# Patient Record
Sex: Female | Born: 1945 | Race: White | Hispanic: No | Marital: Married | Smoking: Former smoker
Health system: Southern US, Community
[De-identification: ages and names within clinical notes are randomized; demographics above are authoritative.]

## PROBLEM LIST (undated history)

## (undated) DIAGNOSIS — Z86010 Personal history of colonic polyps: Secondary | ICD-10-CM

## (undated) DIAGNOSIS — I499 Cardiac arrhythmia, unspecified: Secondary | ICD-10-CM

## (undated) DIAGNOSIS — F419 Anxiety disorder, unspecified: Secondary | ICD-10-CM

## (undated) DIAGNOSIS — C801 Malignant (primary) neoplasm, unspecified: Secondary | ICD-10-CM

## (undated) DIAGNOSIS — M1990 Unspecified osteoarthritis, unspecified site: Secondary | ICD-10-CM

## (undated) DIAGNOSIS — K2970 Gastritis, unspecified, without bleeding: Secondary | ICD-10-CM

## (undated) DIAGNOSIS — C50919 Malignant neoplasm of unspecified site of unspecified female breast: Secondary | ICD-10-CM

## (undated) DIAGNOSIS — G43909 Migraine, unspecified, not intractable, without status migrainosus: Secondary | ICD-10-CM

## (undated) DIAGNOSIS — G4730 Sleep apnea, unspecified: Secondary | ICD-10-CM

## (undated) DIAGNOSIS — I4891 Unspecified atrial fibrillation: Secondary | ICD-10-CM

## (undated) DIAGNOSIS — Z923 Personal history of irradiation: Secondary | ICD-10-CM

## (undated) DIAGNOSIS — G4733 Obstructive sleep apnea (adult) (pediatric): Secondary | ICD-10-CM

## (undated) DIAGNOSIS — E785 Hyperlipidemia, unspecified: Secondary | ICD-10-CM

## (undated) DIAGNOSIS — Z9221 Personal history of antineoplastic chemotherapy: Secondary | ICD-10-CM

## (undated) DIAGNOSIS — K219 Gastro-esophageal reflux disease without esophagitis: Secondary | ICD-10-CM

## (undated) DIAGNOSIS — M8580 Other specified disorders of bone density and structure, unspecified site: Secondary | ICD-10-CM

## (undated) DIAGNOSIS — F32A Depression, unspecified: Secondary | ICD-10-CM

## (undated) DIAGNOSIS — K222 Esophageal obstruction: Secondary | ICD-10-CM

## (undated) DIAGNOSIS — D649 Anemia, unspecified: Secondary | ICD-10-CM

## (undated) HISTORY — DX: Personal history of antineoplastic chemotherapy: Z92.21

## (undated) HISTORY — DX: Sleep apnea, unspecified: G47.30

## (undated) HISTORY — DX: Gastritis, unspecified, without bleeding: K29.70

## (undated) HISTORY — DX: Obstructive sleep apnea (adult) (pediatric): G47.33

## (undated) HISTORY — DX: Anemia, unspecified: D64.9

## (undated) HISTORY — DX: Other specified disorders of bone density and structure, unspecified site: M85.80

## (undated) HISTORY — DX: Malignant neoplasm of unspecified site of unspecified female breast: C50.919

## (undated) HISTORY — PX: EYE SURGERY: SHX253

## (undated) HISTORY — DX: Personal history of irradiation: Z92.3

## (undated) HISTORY — DX: Migraine, unspecified, not intractable, without status migrainosus: G43.909

## (undated) HISTORY — DX: Gastro-esophageal reflux disease without esophagitis: K21.9

## (undated) HISTORY — DX: Personal history of colonic polyps: Z86.010

## (undated) HISTORY — PX: COLONOSCOPY: SHX174

## (undated) HISTORY — PX: OTHER SURGICAL HISTORY: SHX169

## (undated) HISTORY — PX: UPPER GASTROINTESTINAL ENDOSCOPY: SHX188

## (undated) HISTORY — DX: Hyperlipidemia, unspecified: E78.5

## (undated) HISTORY — DX: Malignant (primary) neoplasm, unspecified: C80.1

## (undated) HISTORY — DX: Esophageal obstruction: K22.2

---

## 1997-07-12 ENCOUNTER — Other Ambulatory Visit: Admission: RE | Admit: 1997-07-12 | Discharge: 1997-07-12 | Payer: Self-pay | Admitting: Obstetrics & Gynecology

## 1998-04-03 ENCOUNTER — Ambulatory Visit (HOSPITAL_COMMUNITY): Admission: RE | Admit: 1998-04-03 | Discharge: 1998-04-03 | Payer: Self-pay | Admitting: Internal Medicine

## 1998-04-03 ENCOUNTER — Encounter: Payer: Self-pay | Admitting: Internal Medicine

## 1998-04-10 ENCOUNTER — Encounter: Payer: Self-pay | Admitting: Internal Medicine

## 1998-04-10 ENCOUNTER — Ambulatory Visit (HOSPITAL_COMMUNITY): Admission: RE | Admit: 1998-04-10 | Discharge: 1998-04-10 | Payer: Self-pay | Admitting: Internal Medicine

## 1998-07-16 ENCOUNTER — Other Ambulatory Visit: Admission: RE | Admit: 1998-07-16 | Discharge: 1998-07-16 | Payer: Self-pay | Admitting: Obstetrics and Gynecology

## 2000-03-09 ENCOUNTER — Other Ambulatory Visit: Admission: RE | Admit: 2000-03-09 | Discharge: 2000-03-09 | Payer: Self-pay | Admitting: Obstetrics and Gynecology

## 2000-04-23 ENCOUNTER — Other Ambulatory Visit: Admission: RE | Admit: 2000-04-23 | Discharge: 2000-04-23 | Payer: Self-pay | Admitting: Gastroenterology

## 2000-04-23 ENCOUNTER — Encounter (INDEPENDENT_AMBULATORY_CARE_PROVIDER_SITE_OTHER): Payer: Self-pay | Admitting: Specialist

## 2003-10-02 ENCOUNTER — Other Ambulatory Visit: Admission: RE | Admit: 2003-10-02 | Discharge: 2003-10-02 | Payer: Self-pay | Admitting: Obstetrics and Gynecology

## 2004-02-06 ENCOUNTER — Ambulatory Visit: Payer: Self-pay | Admitting: Internal Medicine

## 2004-03-11 ENCOUNTER — Ambulatory Visit: Payer: Self-pay | Admitting: Internal Medicine

## 2004-03-28 ENCOUNTER — Ambulatory Visit: Payer: Self-pay | Admitting: Internal Medicine

## 2004-10-15 ENCOUNTER — Other Ambulatory Visit: Admission: RE | Admit: 2004-10-15 | Discharge: 2004-10-15 | Payer: Self-pay | Admitting: Obstetrics and Gynecology

## 2004-11-18 ENCOUNTER — Ambulatory Visit: Payer: Self-pay | Admitting: Internal Medicine

## 2004-11-19 ENCOUNTER — Ambulatory Visit: Payer: Self-pay

## 2005-01-01 ENCOUNTER — Ambulatory Visit: Payer: Self-pay | Admitting: Internal Medicine

## 2005-06-04 ENCOUNTER — Ambulatory Visit: Payer: Self-pay | Admitting: Gastroenterology

## 2005-06-18 ENCOUNTER — Ambulatory Visit: Payer: Self-pay | Admitting: Gastroenterology

## 2005-06-18 ENCOUNTER — Encounter: Payer: Self-pay | Admitting: Internal Medicine

## 2005-11-26 ENCOUNTER — Other Ambulatory Visit: Admission: RE | Admit: 2005-11-26 | Discharge: 2005-11-26 | Payer: Self-pay | Admitting: Obstetrics and Gynecology

## 2006-12-23 ENCOUNTER — Ambulatory Visit: Payer: Self-pay | Admitting: Internal Medicine

## 2006-12-23 ENCOUNTER — Encounter: Payer: Self-pay | Admitting: Internal Medicine

## 2006-12-23 DIAGNOSIS — M949 Disorder of cartilage, unspecified: Secondary | ICD-10-CM

## 2006-12-23 DIAGNOSIS — G252 Other specified forms of tremor: Secondary | ICD-10-CM

## 2006-12-23 DIAGNOSIS — M899 Disorder of bone, unspecified: Secondary | ICD-10-CM | POA: Insufficient documentation

## 2006-12-23 DIAGNOSIS — G43809 Other migraine, not intractable, without status migrainosus: Secondary | ICD-10-CM | POA: Insufficient documentation

## 2006-12-23 DIAGNOSIS — F329 Major depressive disorder, single episode, unspecified: Secondary | ICD-10-CM

## 2006-12-23 DIAGNOSIS — F411 Generalized anxiety disorder: Secondary | ICD-10-CM | POA: Insufficient documentation

## 2006-12-23 DIAGNOSIS — F32A Depression, unspecified: Secondary | ICD-10-CM | POA: Insufficient documentation

## 2006-12-23 DIAGNOSIS — G250 Essential tremor: Secondary | ICD-10-CM

## 2006-12-29 ENCOUNTER — Encounter: Payer: Self-pay | Admitting: *Deleted

## 2006-12-29 DIAGNOSIS — I868 Varicose veins of other specified sites: Secondary | ICD-10-CM

## 2007-01-12 ENCOUNTER — Other Ambulatory Visit: Admission: RE | Admit: 2007-01-12 | Discharge: 2007-01-12 | Payer: Self-pay | Admitting: Obstetrics and Gynecology

## 2007-02-22 ENCOUNTER — Telehealth: Payer: Self-pay | Admitting: Internal Medicine

## 2007-02-23 ENCOUNTER — Telehealth: Payer: Self-pay | Admitting: Internal Medicine

## 2007-05-16 ENCOUNTER — Telehealth: Payer: Self-pay | Admitting: Internal Medicine

## 2007-07-19 ENCOUNTER — Encounter: Payer: Self-pay | Admitting: Internal Medicine

## 2007-08-17 ENCOUNTER — Ambulatory Visit: Payer: Self-pay | Admitting: Internal Medicine

## 2007-08-17 DIAGNOSIS — L255 Unspecified contact dermatitis due to plants, except food: Secondary | ICD-10-CM

## 2007-09-06 ENCOUNTER — Telehealth: Payer: Self-pay | Admitting: Internal Medicine

## 2007-10-01 DIAGNOSIS — K2970 Gastritis, unspecified, without bleeding: Secondary | ICD-10-CM

## 2007-10-01 HISTORY — DX: Gastritis, unspecified, without bleeding: K29.70

## 2007-10-10 ENCOUNTER — Telehealth: Payer: Self-pay | Admitting: Internal Medicine

## 2007-10-10 ENCOUNTER — Ambulatory Visit (HOSPITAL_COMMUNITY): Admission: RE | Admit: 2007-10-10 | Discharge: 2007-10-10 | Payer: Self-pay | Admitting: Internal Medicine

## 2007-10-10 ENCOUNTER — Encounter: Payer: Self-pay | Admitting: Internal Medicine

## 2007-11-22 ENCOUNTER — Encounter: Payer: Self-pay | Admitting: Internal Medicine

## 2007-11-23 ENCOUNTER — Encounter: Payer: Self-pay | Admitting: Internal Medicine

## 2007-11-23 ENCOUNTER — Ambulatory Visit: Payer: Self-pay | Admitting: Internal Medicine

## 2007-11-23 DIAGNOSIS — K222 Esophageal obstruction: Secondary | ICD-10-CM

## 2007-11-23 DIAGNOSIS — C50919 Malignant neoplasm of unspecified site of unspecified female breast: Secondary | ICD-10-CM

## 2007-11-23 DIAGNOSIS — K2970 Gastritis, unspecified, without bleeding: Secondary | ICD-10-CM | POA: Insufficient documentation

## 2007-11-23 DIAGNOSIS — Z853 Personal history of malignant neoplasm of breast: Secondary | ICD-10-CM

## 2007-11-23 DIAGNOSIS — K219 Gastro-esophageal reflux disease without esophagitis: Secondary | ICD-10-CM | POA: Insufficient documentation

## 2007-11-23 DIAGNOSIS — K2990 Gastroduodenitis, unspecified, without bleeding: Secondary | ICD-10-CM

## 2007-11-23 HISTORY — DX: Malignant neoplasm of unspecified site of unspecified female breast: C50.919

## 2007-11-23 HISTORY — DX: Esophageal obstruction: K22.2

## 2007-11-30 ENCOUNTER — Encounter: Payer: Self-pay | Admitting: Internal Medicine

## 2007-12-05 ENCOUNTER — Ambulatory Visit: Payer: Self-pay | Admitting: Oncology

## 2007-12-06 ENCOUNTER — Ambulatory Visit: Payer: Self-pay | Admitting: Internal Medicine

## 2007-12-08 ENCOUNTER — Encounter: Admission: RE | Admit: 2007-12-08 | Discharge: 2007-12-08 | Payer: Self-pay | Admitting: Surgery

## 2007-12-12 ENCOUNTER — Encounter: Payer: Self-pay | Admitting: Internal Medicine

## 2007-12-12 LAB — CBC WITH DIFFERENTIAL/PLATELET
Basophils Absolute: 0 10*3/uL (ref 0.0–0.1)
Eosinophils Absolute: 0 10*3/uL (ref 0.0–0.5)
HGB: 14.1 g/dL (ref 11.6–15.9)
LYMPH%: 18.6 % (ref 14.0–48.0)
MCV: 86.3 fL (ref 81.0–101.0)
MONO#: 0.7 10*3/uL (ref 0.1–0.9)
MONO%: 8.5 % (ref 0.0–13.0)
NEUT#: 6.1 10*3/uL (ref 1.5–6.5)
Platelets: 392 10*3/uL (ref 145–400)
RBC: 4.78 10*6/uL (ref 3.70–5.32)
RDW: 15.8 % — ABNORMAL HIGH (ref 11.3–14.5)
WBC: 8.4 10*3/uL (ref 3.9–10.0)

## 2007-12-13 LAB — COMPREHENSIVE METABOLIC PANEL
Albumin: 4.6 g/dL (ref 3.5–5.2)
BUN: 23 mg/dL (ref 6–23)
CO2: 22 mEq/L (ref 19–32)
Glucose, Bld: 107 mg/dL — ABNORMAL HIGH (ref 70–99)
Potassium: 3.8 mEq/L (ref 3.5–5.3)
Sodium: 135 mEq/L (ref 135–145)
Total Protein: 7.4 g/dL (ref 6.0–8.3)

## 2007-12-13 LAB — VITAMIN D 25 HYDROXY (VIT D DEFICIENCY, FRACTURES): Vit D, 25-Hydroxy: 43 ng/mL (ref 30–89)

## 2007-12-13 LAB — CANCER ANTIGEN 27.29: CA 27.29: 28 U/mL (ref 0–39)

## 2007-12-15 ENCOUNTER — Encounter: Payer: Self-pay | Admitting: Oncology

## 2007-12-15 ENCOUNTER — Ambulatory Visit (HOSPITAL_COMMUNITY): Admission: RE | Admit: 2007-12-15 | Discharge: 2007-12-15 | Payer: Self-pay | Admitting: Oncology

## 2007-12-15 LAB — PROTEIN / CREATININE RATIO, URINE: Creatinine, Urine: 24.4 mg/dL

## 2007-12-16 ENCOUNTER — Ambulatory Visit (HOSPITAL_COMMUNITY): Admission: RE | Admit: 2007-12-16 | Discharge: 2007-12-16 | Payer: Self-pay | Admitting: Oncology

## 2007-12-20 ENCOUNTER — Ambulatory Visit (HOSPITAL_COMMUNITY): Admission: RE | Admit: 2007-12-20 | Discharge: 2007-12-20 | Payer: Self-pay | Admitting: Surgery

## 2008-01-02 ENCOUNTER — Encounter: Payer: Self-pay | Admitting: Internal Medicine

## 2008-01-02 LAB — CBC WITH DIFFERENTIAL/PLATELET
BASO%: 0.2 % (ref 0.0–2.0)
Eosinophils Absolute: 0.1 10*3/uL (ref 0.0–0.5)
LYMPH%: 61.4 % — ABNORMAL HIGH (ref 14.0–48.0)
MCH: 30 pg (ref 26.0–34.0)
MCHC: 34.5 g/dL (ref 32.0–36.0)
MCV: 86.8 fL (ref 81.0–101.0)
MONO%: 1.3 % (ref 0.0–13.0)
Platelets: 179 10*3/uL (ref 145–400)
RBC: 4.61 10*6/uL (ref 3.70–5.32)

## 2008-01-04 LAB — CBC WITH DIFFERENTIAL/PLATELET
BASO%: 1.7 % (ref 0.0–2.0)
LYMPH%: 67.1 % — ABNORMAL HIGH (ref 14.0–48.0)
MCHC: 34.8 g/dL (ref 32.0–36.0)
MONO#: 0.1 10*3/uL (ref 0.1–0.9)
Platelets: 117 10*3/uL — ABNORMAL LOW (ref 145–400)
RBC: 4.2 10*6/uL (ref 3.70–5.32)
WBC: 1.2 10*3/uL — ABNORMAL LOW (ref 3.9–10.0)

## 2008-01-11 ENCOUNTER — Ambulatory Visit: Payer: Self-pay | Admitting: Internal Medicine

## 2008-01-16 ENCOUNTER — Encounter: Payer: Self-pay | Admitting: Internal Medicine

## 2008-01-16 LAB — CBC WITH DIFFERENTIAL/PLATELET
BASO%: 0.2 % (ref 0.0–2.0)
HCT: 36.6 % (ref 34.8–46.6)
MCHC: 34 g/dL (ref 32.0–36.0)
MONO#: 0.4 10*3/uL (ref 0.1–0.9)
NEUT%: 87.5 % — ABNORMAL HIGH (ref 39.6–76.8)
RBC: 4.12 10*6/uL (ref 3.70–5.32)
RDW: 17.8 % — ABNORMAL HIGH (ref 11.3–14.5)
WBC: 19.9 10*3/uL — ABNORMAL HIGH (ref 3.9–10.0)
lymph#: 2 10*3/uL (ref 0.9–3.3)

## 2008-01-18 ENCOUNTER — Ambulatory Visit: Payer: Self-pay | Admitting: Genetic Counselor

## 2008-01-18 LAB — CBC WITH DIFFERENTIAL/PLATELET
BASO%: 0.1 % (ref 0.0–2.0)
EOS%: 0 % (ref 0.0–7.0)
HCT: 37.3 % (ref 34.8–46.6)
MCH: 30 pg (ref 26.0–34.0)
MCHC: 33.8 g/dL (ref 32.0–36.0)
MCV: 88.8 fL (ref 81.0–101.0)
MONO%: 1.7 % (ref 0.0–13.0)
NEUT%: 92.6 % — ABNORMAL HIGH (ref 39.6–76.8)
lymph#: 0.9 10*3/uL (ref 0.9–3.3)

## 2008-01-19 ENCOUNTER — Ambulatory Visit: Payer: Self-pay | Admitting: Oncology

## 2008-01-20 LAB — CBC WITH DIFFERENTIAL/PLATELET
BASO%: 0.2 % (ref 0.0–2.0)
EOS%: 0.1 % (ref 0.0–7.0)
HGB: 12.3 g/dL (ref 11.6–15.9)
MCH: 30.1 pg (ref 26.0–34.0)
MCHC: 34 g/dL (ref 32.0–36.0)
MCV: 88.4 fL (ref 81.0–101.0)
MONO%: 0.1 % (ref 0.0–13.0)
RBC: 4.1 10*6/uL (ref 3.70–5.32)
RDW: 17.7 % — ABNORMAL HIGH (ref 11.3–14.5)
lymph#: 1.4 10*3/uL (ref 0.9–3.3)

## 2008-01-24 ENCOUNTER — Encounter: Payer: Self-pay | Admitting: Internal Medicine

## 2008-01-24 LAB — CBC WITH DIFFERENTIAL/PLATELET
Basophils Absolute: 0.1 10*3/uL (ref 0.0–0.1)
Eosinophils Absolute: 0 10*3/uL (ref 0.0–0.5)
HGB: 12.3 g/dL (ref 11.6–15.9)
MONO%: 7.3 % (ref 0.0–13.0)
NEUT#: 2.6 10*3/uL (ref 1.5–6.5)
RBC: 4.16 10*6/uL (ref 3.70–5.32)
RDW: 15.4 % — ABNORMAL HIGH (ref 11.3–14.5)
WBC: 4.3 10*3/uL (ref 3.9–10.0)
lymph#: 1.2 10*3/uL (ref 0.9–3.3)

## 2008-02-06 ENCOUNTER — Encounter: Payer: Self-pay | Admitting: Internal Medicine

## 2008-02-06 LAB — COMPREHENSIVE METABOLIC PANEL
ALT: 13 U/L (ref 0–35)
AST: 16 U/L (ref 0–37)
Albumin: 3.3 g/dL — ABNORMAL LOW (ref 3.5–5.2)
Alkaline Phosphatase: 91 U/L (ref 39–117)
BUN: 8 mg/dL (ref 6–23)
Calcium: 9 mg/dL (ref 8.4–10.5)
Chloride: 104 mEq/L (ref 96–112)
Potassium: 4 mEq/L (ref 3.5–5.3)
Sodium: 140 mEq/L (ref 135–145)
Total Protein: 6.7 g/dL (ref 6.0–8.3)

## 2008-02-06 LAB — CBC WITH DIFFERENTIAL/PLATELET
BASO%: 1.5 % (ref 0.0–2.0)
Basophils Absolute: 0.1 10*3/uL (ref 0.0–0.1)
EOS%: 4 % (ref 0.0–7.0)
HGB: 11.6 g/dL (ref 11.6–15.9)
MCH: 30.4 pg (ref 26.0–34.0)
RBC: 3.81 10*6/uL (ref 3.70–5.32)
RDW: 18 % — ABNORMAL HIGH (ref 11.3–14.5)
lymph#: 0.9 10*3/uL (ref 0.9–3.3)

## 2008-02-06 LAB — PROTEIN / CREATININE RATIO, URINE: Total Protein, Urine: 9 mg/dL

## 2008-02-14 ENCOUNTER — Encounter: Payer: Self-pay | Admitting: Internal Medicine

## 2008-02-14 LAB — CBC WITH DIFFERENTIAL/PLATELET
Basophils Absolute: 0.2 10*3/uL — ABNORMAL HIGH (ref 0.0–0.1)
Eosinophils Absolute: 0.1 10*3/uL (ref 0.0–0.5)
HCT: 35.8 % (ref 34.8–46.6)
HGB: 12.1 g/dL (ref 11.6–15.9)
MCV: 86.8 fL (ref 81.0–101.0)
NEUT#: 7 10*3/uL — ABNORMAL HIGH (ref 1.5–6.5)
NEUT%: 68.1 % (ref 39.6–76.8)
RDW: 15.7 % — ABNORMAL HIGH (ref 11.3–14.5)
lymph#: 2 10*3/uL (ref 0.9–3.3)

## 2008-02-28 ENCOUNTER — Encounter: Payer: Self-pay | Admitting: Internal Medicine

## 2008-02-28 ENCOUNTER — Ambulatory Visit: Payer: Self-pay | Admitting: Oncology

## 2008-02-28 LAB — CBC WITH DIFFERENTIAL/PLATELET
Basophils Absolute: 0.1 10*3/uL (ref 0.0–0.1)
Eosinophils Absolute: 0.1 10*3/uL (ref 0.0–0.5)
HGB: 11.1 g/dL — ABNORMAL LOW (ref 11.6–15.9)
LYMPH%: 24.1 % (ref 14.0–48.0)
MCV: 88.5 fL (ref 81.0–101.0)
MONO%: 8.6 % (ref 0.0–13.0)
NEUT#: 4.2 10*3/uL (ref 1.5–6.5)
NEUT%: 64.2 % (ref 39.6–76.8)
Platelets: 431 10*3/uL — ABNORMAL HIGH (ref 145–400)

## 2008-03-06 ENCOUNTER — Encounter: Payer: Self-pay | Admitting: Internal Medicine

## 2008-03-06 LAB — CBC WITH DIFFERENTIAL/PLATELET
BASO%: 1 % (ref 0.0–2.0)
EOS%: 1.6 % (ref 0.0–7.0)
MCH: 30.7 pg (ref 26.0–34.0)
MCHC: 33.7 g/dL (ref 32.0–36.0)
MONO%: 8.6 % (ref 0.0–13.0)
NEUT%: 67 % (ref 39.6–76.8)
RDW: 17.9 % — ABNORMAL HIGH (ref 11.3–14.5)
lymph#: 1.7 10*3/uL (ref 0.9–3.3)

## 2008-03-20 ENCOUNTER — Encounter: Payer: Self-pay | Admitting: Internal Medicine

## 2008-03-27 ENCOUNTER — Encounter: Payer: Self-pay | Admitting: Internal Medicine

## 2008-04-06 ENCOUNTER — Ambulatory Visit: Payer: Self-pay | Admitting: Oncology

## 2008-04-10 ENCOUNTER — Encounter: Payer: Self-pay | Admitting: Internal Medicine

## 2008-04-10 LAB — CBC WITH DIFFERENTIAL/PLATELET
BASO%: 0.5 % (ref 0.0–2.0)
EOS%: 0.6 % (ref 0.0–7.0)
HCT: 36.5 % (ref 34.8–46.6)
LYMPH%: 29.3 % (ref 14.0–48.0)
MCH: 31.2 pg (ref 26.0–34.0)
MCHC: 33.3 g/dL (ref 32.0–36.0)
MCV: 93.6 fL (ref 81.0–101.0)
NEUT%: 57.3 % (ref 39.6–76.8)
Platelets: 264 10*3/uL (ref 145–400)

## 2008-04-17 ENCOUNTER — Encounter: Payer: Self-pay | Admitting: Internal Medicine

## 2008-04-17 LAB — CBC WITH DIFFERENTIAL/PLATELET
BASO%: 0.6 % (ref 0.0–2.0)
EOS%: 1.6 % (ref 0.0–7.0)
HGB: 12 g/dL (ref 11.6–15.9)
MCH: 31.6 pg (ref 26.0–34.0)
MCHC: 33.5 g/dL (ref 32.0–36.0)
RDW: 17.9 % — ABNORMAL HIGH (ref 11.3–14.5)
lymph#: 0.3 10*3/uL — ABNORMAL LOW (ref 0.9–3.3)

## 2008-05-01 LAB — CBC WITH DIFFERENTIAL/PLATELET
BASO%: 0.3 % (ref 0.0–2.0)
Basophils Absolute: 0 10*3/uL (ref 0.0–0.1)
EOS%: 2.5 % (ref 0.0–7.0)
HCT: 39.3 % (ref 34.8–46.6)
LYMPH%: 15.6 % (ref 14.0–49.7)
MCH: 31.7 pg (ref 25.1–34.0)
MCHC: 33.5 g/dL (ref 31.5–36.0)
NEUT%: 73 % (ref 38.4–76.8)
Platelets: 350 10*3/uL (ref 145–400)

## 2008-05-01 LAB — COMPREHENSIVE METABOLIC PANEL
ALT: 26 U/L (ref 0–35)
AST: 30 U/L (ref 0–37)
CO2: 28 mEq/L (ref 19–32)
Calcium: 9.9 mg/dL (ref 8.4–10.5)
Chloride: 98 mEq/L (ref 96–112)
Creatinine, Ser: 0.57 mg/dL (ref 0.40–1.20)
Sodium: 134 mEq/L — ABNORMAL LOW (ref 135–145)
Total Bilirubin: 0.5 mg/dL (ref 0.3–1.2)
Total Protein: 6.9 g/dL (ref 6.0–8.3)

## 2008-05-08 ENCOUNTER — Encounter: Payer: Self-pay | Admitting: Internal Medicine

## 2008-05-08 LAB — CBC WITH DIFFERENTIAL/PLATELET
BASO%: 1.6 % (ref 0.0–2.0)
EOS%: 7.5 % — ABNORMAL HIGH (ref 0.0–7.0)
HCT: 35 % (ref 34.8–46.6)
LYMPH%: 11.3 % — ABNORMAL LOW (ref 14.0–49.7)
MCH: 31.7 pg (ref 25.1–34.0)
MCHC: 33.7 g/dL (ref 31.5–36.0)
NEUT%: 76.5 % (ref 38.4–76.8)
Platelets: 272 10*3/uL (ref 145–400)
RBC: 3.73 10*6/uL (ref 3.70–5.45)

## 2008-05-22 ENCOUNTER — Ambulatory Visit: Payer: Self-pay | Admitting: Oncology

## 2008-05-22 ENCOUNTER — Encounter: Payer: Self-pay | Admitting: Internal Medicine

## 2008-05-22 LAB — CBC WITH DIFFERENTIAL/PLATELET
BASO%: 0.5 % (ref 0.0–2.0)
EOS%: 3.3 % (ref 0.0–7.0)
MCH: 31.9 pg (ref 25.1–34.0)
MCHC: 33.8 g/dL (ref 31.5–36.0)
RBC: 3.95 10*6/uL (ref 3.70–5.45)
RDW: 19.2 % — ABNORMAL HIGH (ref 11.2–14.5)
WBC: 9.3 10*3/uL (ref 3.9–10.3)
lymph#: 1.2 10*3/uL (ref 0.9–3.3)

## 2008-05-29 ENCOUNTER — Encounter: Payer: Self-pay | Admitting: Internal Medicine

## 2008-05-29 LAB — COMPREHENSIVE METABOLIC PANEL
ALT: 8 U/L (ref 0–35)
AST: 14 U/L (ref 0–37)
Albumin: 4.4 g/dL (ref 3.5–5.2)
BUN: 11 mg/dL (ref 6–23)
CO2: 25 mEq/L (ref 19–32)
Calcium: 9.3 mg/dL (ref 8.4–10.5)
Chloride: 101 mEq/L (ref 96–112)
Potassium: 4 mEq/L (ref 3.5–5.3)

## 2008-05-29 LAB — CBC WITH DIFFERENTIAL/PLATELET
Basophils Absolute: 0 10*3/uL (ref 0.0–0.1)
EOS%: 5.9 % (ref 0.0–7.0)
HGB: 12.3 g/dL (ref 11.6–15.9)
MCH: 32 pg (ref 25.1–34.0)
MONO#: 0 10*3/uL — ABNORMAL LOW (ref 0.1–0.9)
NEUT#: 2.2 10*3/uL (ref 1.5–6.5)
RDW: 18.1 % — ABNORMAL HIGH (ref 11.2–14.5)
WBC: 2.8 10*3/uL — ABNORMAL LOW (ref 3.9–10.3)
lymph#: 0.4 10*3/uL — ABNORMAL LOW (ref 0.9–3.3)

## 2008-06-08 ENCOUNTER — Ambulatory Visit (HOSPITAL_COMMUNITY): Admission: RE | Admit: 2008-06-08 | Discharge: 2008-06-08 | Payer: Self-pay | Admitting: Oncology

## 2008-06-12 ENCOUNTER — Ambulatory Visit: Admission: RE | Admit: 2008-06-12 | Discharge: 2008-06-12 | Payer: Self-pay | Admitting: Oncology

## 2008-06-12 ENCOUNTER — Encounter: Payer: Self-pay | Admitting: Internal Medicine

## 2008-06-12 ENCOUNTER — Encounter: Payer: Self-pay | Admitting: Oncology

## 2008-06-18 ENCOUNTER — Encounter: Payer: Self-pay | Admitting: Internal Medicine

## 2008-06-18 LAB — CBC WITH DIFFERENTIAL/PLATELET
Basophils Absolute: 0.1 10*3/uL (ref 0.0–0.1)
Eosinophils Absolute: 0.3 10*3/uL (ref 0.0–0.5)
HCT: 36.2 % (ref 34.8–46.6)
HGB: 12.2 g/dL (ref 11.6–15.9)
LYMPH%: 9.6 % — ABNORMAL LOW (ref 14.0–49.7)
MONO#: 0.9 10*3/uL (ref 0.1–0.9)
NEUT#: 7.9 10*3/uL — ABNORMAL HIGH (ref 1.5–6.5)
NEUT%: 78.3 % — ABNORMAL HIGH (ref 38.4–76.8)
Platelets: 439 10*3/uL — ABNORMAL HIGH (ref 145–400)
WBC: 10.1 10*3/uL (ref 3.9–10.3)

## 2008-06-22 ENCOUNTER — Encounter: Admission: RE | Admit: 2008-06-22 | Discharge: 2008-06-22 | Payer: Self-pay | Admitting: Surgery

## 2008-06-25 ENCOUNTER — Telehealth: Payer: Self-pay | Admitting: Internal Medicine

## 2008-06-26 ENCOUNTER — Ambulatory Visit (HOSPITAL_BASED_OUTPATIENT_CLINIC_OR_DEPARTMENT_OTHER): Admission: RE | Admit: 2008-06-26 | Discharge: 2008-06-26 | Payer: Self-pay | Admitting: Surgery

## 2008-06-26 ENCOUNTER — Encounter: Admission: RE | Admit: 2008-06-26 | Discharge: 2008-06-26 | Payer: Self-pay | Admitting: Surgery

## 2008-06-26 ENCOUNTER — Encounter (INDEPENDENT_AMBULATORY_CARE_PROVIDER_SITE_OTHER): Payer: Self-pay | Admitting: Surgery

## 2008-06-26 HISTORY — PX: BREAST LUMPECTOMY: SHX2

## 2008-07-04 ENCOUNTER — Ambulatory Visit: Admission: RE | Admit: 2008-07-04 | Discharge: 2008-09-23 | Payer: Self-pay | Admitting: Radiation Oncology

## 2008-07-05 ENCOUNTER — Encounter: Payer: Self-pay | Admitting: Internal Medicine

## 2008-07-06 ENCOUNTER — Encounter: Payer: Self-pay | Admitting: Internal Medicine

## 2008-07-20 ENCOUNTER — Ambulatory Visit: Payer: Self-pay | Admitting: Oncology

## 2008-07-24 ENCOUNTER — Encounter: Payer: Self-pay | Admitting: Internal Medicine

## 2008-07-24 LAB — COMPREHENSIVE METABOLIC PANEL
ALT: 29 U/L (ref 0–35)
CO2: 25 mEq/L (ref 19–32)
Creatinine, Ser: 0.56 mg/dL (ref 0.40–1.20)
Glucose, Bld: 123 mg/dL — ABNORMAL HIGH (ref 70–99)
Total Bilirubin: 0.4 mg/dL (ref 0.3–1.2)

## 2008-07-24 LAB — CBC WITH DIFFERENTIAL/PLATELET
BASO%: 0.7 % (ref 0.0–2.0)
HCT: 38.3 % (ref 34.8–46.6)
LYMPH%: 21.8 % (ref 14.0–49.7)
MCHC: 33.4 g/dL (ref 31.5–36.0)
MONO#: 0.4 10*3/uL (ref 0.1–0.9)
NEUT%: 58.1 % (ref 38.4–76.8)
Platelets: 286 10*3/uL (ref 145–400)
WBC: 4.9 10*3/uL (ref 3.9–10.3)

## 2008-08-13 ENCOUNTER — Ambulatory Visit: Payer: Self-pay | Admitting: Internal Medicine

## 2008-08-13 DIAGNOSIS — J029 Acute pharyngitis, unspecified: Secondary | ICD-10-CM

## 2008-08-29 ENCOUNTER — Ambulatory Visit: Payer: Self-pay | Admitting: Oncology

## 2008-09-05 ENCOUNTER — Encounter: Payer: Self-pay | Admitting: Internal Medicine

## 2008-09-11 ENCOUNTER — Encounter: Payer: Self-pay | Admitting: Internal Medicine

## 2008-09-11 LAB — CBC WITH DIFFERENTIAL/PLATELET
BASO%: 0.5 % (ref 0.0–2.0)
Basophils Absolute: 0 10*3/uL (ref 0.0–0.1)
EOS%: 13.3 % — ABNORMAL HIGH (ref 0.0–7.0)
Eosinophils Absolute: 0.5 10*3/uL (ref 0.0–0.5)
HCT: 39 % (ref 34.8–46.6)
HGB: 13.3 g/dL (ref 11.6–15.9)
LYMPH%: 16.7 % (ref 14.0–49.7)
MCH: 31.6 pg (ref 25.1–34.0)
MCHC: 34.2 g/dL (ref 31.5–36.0)
MCV: 92.6 fL (ref 79.5–101.0)
MONO#: 0.4 10*3/uL (ref 0.1–0.9)
MONO%: 11.1 % (ref 0.0–14.0)
NEUT#: 2.3 10*3/uL (ref 1.5–6.5)
NEUT%: 58.4 % (ref 38.4–76.8)
Platelets: 233 10*3/uL (ref 145–400)
RBC: 4.21 10*6/uL (ref 3.70–5.45)
RDW: 13.4 % (ref 11.2–14.5)
WBC: 3.9 10*3/uL (ref 3.9–10.3)
lymph#: 0.7 10*3/uL — ABNORMAL LOW (ref 0.9–3.3)

## 2008-09-12 LAB — COMPREHENSIVE METABOLIC PANEL
ALT: 18 U/L (ref 0–35)
AST: 24 U/L (ref 0–37)
Albumin: 4.2 g/dL (ref 3.5–5.2)
Alkaline Phosphatase: 65 U/L (ref 39–117)
BUN: 12 mg/dL (ref 6–23)
CO2: 19 mEq/L (ref 19–32)
Calcium: 8.9 mg/dL (ref 8.4–10.5)
Chloride: 105 mEq/L (ref 96–112)
Creatinine, Ser: 0.63 mg/dL (ref 0.40–1.20)
Glucose, Bld: 89 mg/dL (ref 70–99)
Potassium: 4.5 mEq/L (ref 3.5–5.3)
Sodium: 140 mEq/L (ref 135–145)
Total Bilirubin: 0.3 mg/dL (ref 0.3–1.2)
Total Protein: 6.8 g/dL (ref 6.0–8.3)

## 2008-09-12 LAB — CANCER ANTIGEN 27.29: CA 27.29: 28 U/mL (ref 0–39)

## 2008-09-14 ENCOUNTER — Encounter: Payer: Self-pay | Admitting: Internal Medicine

## 2008-10-01 ENCOUNTER — Encounter: Payer: Self-pay | Admitting: Internal Medicine

## 2008-10-05 ENCOUNTER — Ambulatory Visit: Payer: Self-pay | Admitting: Oncology

## 2008-10-09 LAB — PROTEIN / CREATININE RATIO, URINE
Creatinine, Urine: 41.1 mg/dL
Total Protein, Urine: <6 mg/dL

## 2008-10-10 ENCOUNTER — Encounter: Payer: Self-pay | Admitting: Internal Medicine

## 2008-10-18 ENCOUNTER — Encounter: Payer: Self-pay | Admitting: Internal Medicine

## 2008-10-25 ENCOUNTER — Encounter: Payer: Self-pay | Admitting: Nurse Practitioner

## 2008-10-31 ENCOUNTER — Ambulatory Visit: Payer: Self-pay | Admitting: Oncology

## 2008-11-07 ENCOUNTER — Telehealth: Payer: Self-pay | Admitting: Internal Medicine

## 2008-11-15 ENCOUNTER — Encounter: Payer: Self-pay | Admitting: Internal Medicine

## 2008-11-20 LAB — PROTEIN / CREATININE RATIO, URINE: Creatinine, Urine: 39.1 mg/dL

## 2008-11-21 ENCOUNTER — Encounter: Payer: Self-pay | Admitting: Internal Medicine

## 2008-11-23 ENCOUNTER — Encounter: Payer: Self-pay | Admitting: Internal Medicine

## 2008-12-10 ENCOUNTER — Ambulatory Visit: Payer: Self-pay | Admitting: Oncology

## 2008-12-11 ENCOUNTER — Encounter: Payer: Self-pay | Admitting: Nurse Practitioner

## 2008-12-13 ENCOUNTER — Other Ambulatory Visit: Admission: RE | Admit: 2008-12-13 | Discharge: 2008-12-13 | Payer: Self-pay | Admitting: Obstetrics and Gynecology

## 2008-12-17 ENCOUNTER — Encounter: Payer: Self-pay | Admitting: Nurse Practitioner

## 2008-12-17 ENCOUNTER — Ambulatory Visit (HOSPITAL_COMMUNITY): Admission: RE | Admit: 2008-12-17 | Discharge: 2008-12-17 | Payer: Self-pay | Admitting: Oncology

## 2008-12-17 ENCOUNTER — Encounter: Admission: RE | Admit: 2008-12-17 | Discharge: 2008-12-17 | Payer: Self-pay | Admitting: Otolaryngology

## 2008-12-17 LAB — COMPREHENSIVE METABOLIC PANEL
AST: 28 U/L (ref 0–37)
Alkaline Phosphatase: 63 U/L (ref 39–117)
BUN: 6 mg/dL (ref 6–23)
Glucose, Bld: 126 mg/dL — ABNORMAL HIGH (ref 70–99)
Sodium: 139 mEq/L (ref 135–145)
Total Bilirubin: 0.4 mg/dL (ref 0.3–1.2)

## 2008-12-17 LAB — CBC WITH DIFFERENTIAL/PLATELET
Basophils Absolute: 0 10*3/uL (ref 0.0–0.1)
EOS%: 14.2 % — ABNORMAL HIGH (ref 0.0–7.0)
Eosinophils Absolute: 0.8 10*3/uL — ABNORMAL HIGH (ref 0.0–0.5)
LYMPH%: 20.2 % (ref 14.0–49.7)
MCH: 32 pg (ref 25.1–34.0)
MCV: 93.3 fL (ref 79.5–101.0)
MONO%: 6.5 % (ref 0.0–14.0)
NEUT#: 3.3 10*3/uL (ref 1.5–6.5)
Platelets: 225 10*3/uL (ref 145–400)
RBC: 4.09 10*6/uL (ref 3.70–5.45)

## 2008-12-18 LAB — VITAMIN D 25 HYDROXY (VIT D DEFICIENCY, FRACTURES): Vit D, 25-Hydroxy: 60 ng/mL (ref 30–89)

## 2008-12-24 ENCOUNTER — Encounter: Payer: Self-pay | Admitting: Internal Medicine

## 2008-12-24 ENCOUNTER — Telehealth: Payer: Self-pay | Admitting: Internal Medicine

## 2008-12-25 ENCOUNTER — Ambulatory Visit: Payer: Self-pay | Admitting: Gastroenterology

## 2008-12-25 ENCOUNTER — Encounter: Payer: Self-pay | Admitting: Internal Medicine

## 2008-12-25 DIAGNOSIS — R1310 Dysphagia, unspecified: Secondary | ICD-10-CM | POA: Insufficient documentation

## 2008-12-25 DIAGNOSIS — R070 Pain in throat: Secondary | ICD-10-CM

## 2008-12-25 DIAGNOSIS — E041 Nontoxic single thyroid nodule: Secondary | ICD-10-CM

## 2008-12-31 ENCOUNTER — Ambulatory Visit: Payer: Self-pay | Admitting: Internal Medicine

## 2008-12-31 DIAGNOSIS — H6120 Impacted cerumen, unspecified ear: Secondary | ICD-10-CM

## 2009-01-02 ENCOUNTER — Encounter: Payer: Self-pay | Admitting: Internal Medicine

## 2009-01-03 ENCOUNTER — Ambulatory Visit: Payer: Self-pay | Admitting: Internal Medicine

## 2009-01-03 ENCOUNTER — Encounter: Payer: Self-pay | Admitting: Internal Medicine

## 2009-01-07 ENCOUNTER — Encounter: Payer: Self-pay | Admitting: Internal Medicine

## 2009-01-07 LAB — PROTEIN / CREATININE RATIO, URINE
Creatinine, Urine: 50.6 mg/dL
Total Protein, Urine: 3 mg/dL

## 2009-01-07 LAB — CBC WITH DIFFERENTIAL/PLATELET
EOS%: 17.5 % — ABNORMAL HIGH (ref 0.0–7.0)
Eosinophils Absolute: 1.2 10*3/uL — ABNORMAL HIGH (ref 0.0–0.5)
LYMPH%: 18.4 % (ref 14.0–49.7)
MCH: 32.4 pg (ref 25.1–34.0)
MCHC: 34 g/dL (ref 31.5–36.0)
MCV: 95.2 fL (ref 79.5–101.0)
MONO%: 6 % (ref 0.0–14.0)
NEUT#: 3.8 10*3/uL (ref 1.5–6.5)
Platelets: 250 10*3/uL (ref 145–400)
RBC: 4.37 10*6/uL (ref 3.70–5.45)
RDW: 14.2 % (ref 11.2–14.5)

## 2009-01-09 ENCOUNTER — Ambulatory Visit: Payer: Self-pay | Admitting: Oncology

## 2009-01-15 ENCOUNTER — Telehealth: Payer: Self-pay | Admitting: Internal Medicine

## 2009-01-31 ENCOUNTER — Ambulatory Visit: Payer: Self-pay | Admitting: Internal Medicine

## 2009-01-31 DIAGNOSIS — Z87891 Personal history of nicotine dependence: Secondary | ICD-10-CM

## 2009-02-07 ENCOUNTER — Ambulatory Visit (HOSPITAL_BASED_OUTPATIENT_CLINIC_OR_DEPARTMENT_OTHER): Admission: RE | Admit: 2009-02-07 | Discharge: 2009-02-07 | Payer: Self-pay | Admitting: Surgery

## 2009-02-15 ENCOUNTER — Ambulatory Visit: Payer: Self-pay | Admitting: Oncology

## 2009-02-19 ENCOUNTER — Encounter: Payer: Self-pay | Admitting: Internal Medicine

## 2009-02-19 LAB — PROTEIN / CREATININE RATIO, URINE
Creatinine, Urine: 70.1 mg/dL
Total Protein, Urine: 8 mg/dL

## 2009-04-09 ENCOUNTER — Ambulatory Visit: Payer: Self-pay | Admitting: Oncology

## 2009-04-11 ENCOUNTER — Encounter: Payer: Self-pay | Admitting: Internal Medicine

## 2009-04-11 LAB — CBC WITH DIFFERENTIAL/PLATELET
BASO%: 0.5 % (ref 0.0–2.0)
EOS%: 15.7 % — ABNORMAL HIGH (ref 0.0–7.0)
Eosinophils Absolute: 0.9 10*3/uL — ABNORMAL HIGH (ref 0.0–0.5)
MCHC: 34.3 g/dL (ref 31.5–36.0)
MCV: 96.9 fL (ref 79.5–101.0)
MONO%: 6.8 % (ref 0.0–14.0)
NEUT#: 3 10*3/uL (ref 1.5–6.5)
RBC: 4.18 10*6/uL (ref 3.70–5.45)
RDW: 14.3 % (ref 11.2–14.5)

## 2009-04-11 LAB — COMPREHENSIVE METABOLIC PANEL
ALT: 18 U/L (ref 0–35)
AST: 23 U/L (ref 0–37)
Albumin: 3.9 g/dL (ref 3.5–5.2)
Alkaline Phosphatase: 52 U/L (ref 39–117)
Potassium: 4.3 mEq/L (ref 3.5–5.3)
Sodium: 133 mEq/L — ABNORMAL LOW (ref 135–145)
Total Protein: 6.8 g/dL (ref 6.0–8.3)

## 2009-04-18 ENCOUNTER — Ambulatory Visit: Payer: Self-pay | Admitting: Internal Medicine

## 2009-04-25 ENCOUNTER — Encounter: Payer: Self-pay | Admitting: Internal Medicine

## 2009-05-06 ENCOUNTER — Encounter: Payer: Self-pay | Admitting: Internal Medicine

## 2009-05-30 ENCOUNTER — Ambulatory Visit: Payer: Self-pay | Admitting: Oncology

## 2009-06-03 ENCOUNTER — Ambulatory Visit (HOSPITAL_COMMUNITY): Admission: RE | Admit: 2009-06-03 | Discharge: 2009-06-03 | Payer: Self-pay | Admitting: Oncology

## 2009-06-03 LAB — COMPREHENSIVE METABOLIC PANEL
AST: 26 U/L (ref 0–37)
Albumin: 4.3 g/dL (ref 3.5–5.2)
BUN: 10 mg/dL (ref 6–23)
Calcium: 9.3 mg/dL (ref 8.4–10.5)
Chloride: 99 mEq/L (ref 96–112)
Glucose, Bld: 81 mg/dL (ref 70–99)
Potassium: 4.2 mEq/L (ref 3.5–5.3)
Total Protein: 6.9 g/dL (ref 6.0–8.3)

## 2009-06-03 LAB — CBC WITH DIFFERENTIAL/PLATELET
Basophils Absolute: 0 10*3/uL (ref 0.0–0.1)
EOS%: 5.4 % (ref 0.0–7.0)
Eosinophils Absolute: 0.3 10*3/uL (ref 0.0–0.5)
HGB: 14.4 g/dL (ref 11.6–15.9)
NEUT#: 3.8 10*3/uL (ref 1.5–6.5)
RDW: 13.1 % (ref 11.2–14.5)
lymph#: 1.3 10*3/uL (ref 0.9–3.3)

## 2009-06-13 ENCOUNTER — Ambulatory Visit: Payer: Self-pay

## 2009-06-13 ENCOUNTER — Ambulatory Visit: Payer: Self-pay | Admitting: Internal Medicine

## 2009-06-13 ENCOUNTER — Encounter: Payer: Self-pay | Admitting: Cardiology

## 2009-06-13 ENCOUNTER — Encounter: Payer: Self-pay | Admitting: Oncology

## 2009-06-13 ENCOUNTER — Ambulatory Visit (HOSPITAL_COMMUNITY): Admission: RE | Admit: 2009-06-13 | Discharge: 2009-06-13 | Payer: Self-pay | Admitting: Oncology

## 2009-07-09 ENCOUNTER — Ambulatory Visit: Payer: Self-pay | Admitting: Oncology

## 2009-07-10 ENCOUNTER — Encounter: Payer: Self-pay | Admitting: Internal Medicine

## 2009-08-20 ENCOUNTER — Telehealth: Payer: Self-pay | Admitting: Internal Medicine

## 2009-09-04 ENCOUNTER — Encounter: Payer: Self-pay | Admitting: Internal Medicine

## 2009-09-09 ENCOUNTER — Encounter: Admission: RE | Admit: 2009-09-09 | Discharge: 2009-09-09 | Payer: Self-pay | Admitting: Internal Medicine

## 2009-09-11 ENCOUNTER — Telehealth: Payer: Self-pay | Admitting: Internal Medicine

## 2009-10-09 ENCOUNTER — Ambulatory Visit: Payer: Self-pay | Admitting: Internal Medicine

## 2009-10-09 DIAGNOSIS — F4321 Adjustment disorder with depressed mood: Secondary | ICD-10-CM | POA: Insufficient documentation

## 2009-10-09 DIAGNOSIS — E538 Deficiency of other specified B group vitamins: Secondary | ICD-10-CM

## 2009-10-09 LAB — CONVERTED CEMR LAB: Vit D, 25-Hydroxy: 53 ng/mL (ref 30–89)

## 2009-10-10 ENCOUNTER — Telehealth: Payer: Self-pay | Admitting: Internal Medicine

## 2009-10-10 LAB — CONVERTED CEMR LAB
ALT: 16 units/L (ref 0–35)
Albumin: 4 g/dL (ref 3.5–5.2)
BUN: 10 mg/dL (ref 6–23)
CO2: 33 meq/L — ABNORMAL HIGH (ref 19–32)
Calcium: 9.8 mg/dL (ref 8.4–10.5)
Chloride: 100 meq/L (ref 96–112)
Direct LDL: 146.4 mg/dL
GFR calc non Af Amer: 114.19 mL/min (ref >60)
Glucose, Bld: 67 mg/dL — ABNORMAL LOW (ref 70–99)
HCT: 39.2 % (ref 36.0–46.0)
Hemoglobin: 13.4 g/dL (ref 12.0–15.0)
Lymphocytes Relative: 25.1 % (ref 12.0–46.0)
Lymphs Abs: 1.2 10*3/uL (ref 0.7–4.0)
MCV: 97.6 fL (ref 78.0–100.0)
Monocytes Absolute: 0.4 10*3/uL (ref 0.1–1.0)
Neutrophils Relative %: 61.3 % (ref 43.0–77.0)
Nitrite: POSITIVE
Platelets: 278 10*3/uL (ref 150.0–400.0)
Potassium: 4.7 meq/L (ref 3.5–5.1)
RBC: 4.02 M/uL (ref 3.87–5.11)
RDW: 13.2 % (ref 11.5–14.6)
Specific Gravity, Urine: <=1.005 (ref 1.000–1.030)
Total Bilirubin: 0.3 mg/dL (ref 0.3–1.2)
Total CHOL/HDL Ratio: 4
Triglycerides: 88 mg/dL (ref 0.0–149.0)
Urine Glucose: NEGATIVE mg/dL
Vitamin B-12: 256 pg/mL (ref 211–911)
WBC: 4.6 10*3/uL (ref 4.5–10.5)
pH: 7 (ref 5.0–8.0)

## 2009-11-05 ENCOUNTER — Telehealth: Payer: Self-pay | Admitting: Internal Medicine

## 2009-11-29 ENCOUNTER — Ambulatory Visit: Payer: Self-pay | Admitting: Oncology

## 2009-12-03 LAB — COMPREHENSIVE METABOLIC PANEL
AST: 17 U/L (ref 0–37)
Alkaline Phosphatase: 58 U/L (ref 39–117)
BUN: 11 mg/dL (ref 6–23)
Calcium: 9.2 mg/dL (ref 8.4–10.5)
Potassium: 4.2 mEq/L (ref 3.5–5.3)
Total Protein: 6.4 g/dL (ref 6.0–8.3)

## 2009-12-03 LAB — CBC WITH DIFFERENTIAL/PLATELET
BASO%: 0.4 % (ref 0.0–2.0)
Basophils Absolute: 0 10*3/uL (ref 0.0–0.1)
EOS%: 4.3 % (ref 0.0–7.0)
HCT: 36.5 % (ref 34.8–46.6)
HGB: 12.7 g/dL (ref 11.6–15.9)
LYMPH%: 21.9 % (ref 14.0–49.7)
MCH: 33.3 pg (ref 25.1–34.0)
MCV: 95.8 fL (ref 79.5–101.0)
MONO#: 0.4 10*3/uL (ref 0.1–0.9)
MONO%: 6 % (ref 0.0–14.0)
NEUT%: 67.4 % (ref 38.4–76.8)
RBC: 3.81 10*6/uL (ref 3.70–5.45)
RDW: 13.3 % (ref 11.2–14.5)
WBC: 6.9 10*3/uL (ref 3.9–10.3)

## 2009-12-30 ENCOUNTER — Ambulatory Visit: Payer: Self-pay | Admitting: Oncology

## 2009-12-31 ENCOUNTER — Encounter: Payer: Self-pay | Admitting: Internal Medicine

## 2010-01-06 ENCOUNTER — Ambulatory Visit: Payer: Self-pay | Admitting: Internal Medicine

## 2010-02-11 ENCOUNTER — Telehealth (INDEPENDENT_AMBULATORY_CARE_PROVIDER_SITE_OTHER): Payer: Self-pay | Admitting: *Deleted

## 2010-02-14 ENCOUNTER — Ambulatory Visit: Payer: Self-pay | Admitting: Internal Medicine

## 2010-02-17 LAB — CONVERTED CEMR LAB: Vitamin B-12: 656 pg/mL (ref 211–911)

## 2010-03-11 ENCOUNTER — Telehealth: Payer: Self-pay | Admitting: Internal Medicine

## 2010-03-22 ENCOUNTER — Encounter: Payer: Self-pay | Admitting: Oncology

## 2010-03-23 ENCOUNTER — Encounter: Payer: Self-pay | Admitting: Oncology

## 2010-04-03 NOTE — Progress Notes (Signed)
Summary: Cipro  Phone Note Outgoing Call   Summary of Call: see 10-10-09 append.  Per pt she is out of town and needs Cipro at a [employer]. Initial call taken by: Lanier Prude, [facility]),  October 10, 2009 9:43 AM    Prescriptions: CIPROFLOXACIN HCL 250 MG TABS (CIPROFLOXACIN HCL) 1 by mouth two times a day for cystitis  #10 x 0   Entered by:   Lanier Prude, CMA(AAMA)   Authorized by:   Tresa Garter MD   Signed by:   Lanier Prude, [facility]) on 10/10/2009   Method used:   Electronically to        [facility] [phone]* (retail)       [address]       [place], [place]  [address]       Ph: [phone]       Fax: [phone]   RxID:   [id number]

## 2010-04-03 NOTE — Assessment & Plan Note (Signed)
Summary: FU/ NWS  #   Vital Signs:  Patient profile:   65 year old female Height:      65 inches Weight:      132 pounds BMI:     22.05 O2 Sat:      97 % on Room air Temp:     98.2 degrees F oral Pulse rate:   71 / minute Pulse rhythm:   regular Resp:     16 per minute BP sitting:   100 / 70  (left arm) Cuff size:   regular  Vitals Entered By: Lanier Prude, CMA(AAMA) (October 09, 2009 10:09 AM)  O2 Flow:  Room air CC: f/u  Is Patient Diabetic? No Comments pt is not taking Pennsaid or tramadol   Primary Care Provider:  Macarthur Critchley Plotnikov,MD  CC:  f/u .  History of Present Illness: C/o R ankle fracture F/u anxiety, depression, migraine C/o sadness - her brother died tragically 2 wks ago   Current Medications (verified): 1)  Citalopram Hydrobromide 40 Mg Tabs (Citalopram Hydrobromide) .[name].. 1 By Mouth Qd 2)  Vitamin D3 1000 Unit  Tabs (Cholecalciferol) .[name].. 1 Qd 3)  Fioricet 50-325-40 Mg Tabs (Butalbital-Apap-Caffeine) .[name].. 1-2 By Mouth Bid  Prn Headache 4)  Imitrex 100 Mg Tabs (Sumatriptan Succinate) .[name].. 1 By Mouth As Needed Headache 5)  Aspirin 81 Mg Tbec (Aspirin) .... Take One By Mouth Once Daily 6)  Multivitamins  Tabs (Multiple Vitamin) .... Take One By Mouth Once Daily 7)  Caltrate 600+d Plus 600-400 Mg-Unit Tabs (Calcium Carbonate-Vit D-Min) .... Take One By Mouth Once Daily 8)  First-Bxn Mouthwash  Susp (-Lidocaine-Nystatin) .... Swish and Swallow Before Meals 5cc's 9)  Omeprazole 40 Mg Cpdr (Omeprazole) .... Once Daily 10)  Pennsaid 1.5 % Soln (Diclofenac Sodium) .... 3-5 Gtt On Skin Three Times A Day For Pain 11)  Tramadol Hcl 50 Mg Tabs (Tramadol Hcl) .[name].. 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) DISORDER, DEPRESSIVE NEC (ICD-311) OSTEOPENIA (ICD-733.90) ANXIETY (ICD-300.00)  Breast Cancer right 9/09 Dr Darnelle Catalan,  Dr Jamey Ripa GERD, Esophageal Stricture ST R unclear etiol 2010  Past Surgical History: Last updated: 12/25/2008 right lumpectomy  Family History: Last updated: 10/09/2009 Family History of Breast Cancer:Sister Family History of Heart Disease: Father Lung cancer: Father (non smoker) Family History of Colon Cancer:sister B suicide  Social History: Last updated: 12/25/2008 Occupation: Retired Patient is a former smoker  Alcohol Use - yes -beer daily Illicit Drug Use - no Patient gets regular exercise.  Family History: Reviewed history from 12/25/2008 and no changes required. Family History of Breast Cancer:Sister Family History of Heart Disease: Father Lung cancer: Father (non smoker) Family History of Colon Cancer:sister B suicide  Social History: Reviewed history from 12/25/2008 and no changes required. Occupation: Retired Patient is a former smoker  Alcohol Use - yes -beer daily Illicit Drug Use - no Patient gets regular exercise.  Physical Exam  General:  Well developed, well nourished, no acute distress. Head:  Normocephalic and atraumatic without obvious abnormalities. No apparent alopecia or balding. Ears:  External ear exam shows no significant lesions or deformities.  Otoscopic examination reveals clear canals, tympanic membranes are intact bilaterally without bulging, retraction, inflammation or discharge. Hearing is grossly normal bilaterally. Nose:  External nasal examination shows no deformity or inflammation.  Nasal mucosa are pink and moist without lesions or exudates. Mouth:  Oral mucosa and oropharynx without lesions or exudates.  Teeth in good repair. Neck:  No deformities, masses, or tenderness noted. Lungs:  Normal respiratory effort, chest expands symmetrically. Lungs are clear to auscultation, no crackles or  wheezes. Heart:  Normal rate and regular rhythm. S1 and S2 normal without gallop, murmur, click, rub or other extra sounds. Abdomen:  Bowel sounds positive,abdomen soft and non-tender without masses, organomegaly or hernias noted. Msk:  R ankle is slightely swollen laterally Extremities:  No clubbing, cyanosis, edema, or deformity noted with normal full range of motion of all joints.   Neurologic:  Alert and  oriented x4;  grossly normal neurologically. Skin:  Intact without suspicious lesions or rashes Psych:  Cognition and judgment appear intact. Alert and cooperative with normal attention span and concentration. No apparent delusions, illusions, hallucinations not suicidal and depressed affect.     Impression & Recommendations:  Problem # 1:  DEPRESSION (ICD-311) Assessment Unchanged  Her updated medication list for this problem includes:    Citalopram Hydrobromide 40 Mg Tabs (Citalopram hydrobromide) .[name]... 1 by mouth qd  Orders: T-Vitamin D (25-Hydroxy) [phone]) TLB-B12, Serum-Total ONLY (57846-N62) TLB-BMP (Basic Metabolic Panel-BMET) (80048-METABOL) TLB-CBC Platelet - w/Differential (85025-CBCD) TLB-Hepatic/Liver Function Pnl (80076-HEPATIC) TLB-TSH (Thyroid Stimulating Hormone) (84443-TSH) TLB-Udip ONLY (81003-UDIP) TLB-Lipid Panel (80061-LIPID)  Problem # 2:  GERD (ICD-530.81) Assessment: Unchanged  Her updated medication list for this problem includes:    Omeprazole 40 Mg Cpdr (Omeprazole) .[name]... 1 once daily  Problem # 3:  GRIEF REACTION, ACUTE (ICD-309.0) Assessment: New Discussed  Problem # 4:  ANXIETY (ICD-300.00) Assessment: Unchanged  Her updated medication list for this problem includes:    Citalopram Hydrobromide 40 Mg Tabs (Citalopram hydrobromide) .[name]... 1 by mouth qd  Problem # 5:  CYSTITIS (ICD-595.9) Assessment: New  Her updated medication list for this problem includes:    Ciprofloxacin Hcl 250 Mg Tabs (Ciprofloxacin hcl) .[name]... 1 by mouth two  times a day for cystitis  Problem # 6:  VITAMIN B12 DEFICIENCY (ICD-266.2) borderline Assessment: New Start B12  Complete Medication List: 1)  Citalopram Hydrobromide 40 Mg Tabs (Citalopram hydrobromide) .[name].. 1 by mouth qd 2)  Vitamin D3 1000 Unit Tabs (Cholecalciferol) .[name].. 1 qd 3)  Fioricet 50-325-40 Mg Tabs (Butalbital-apap-caffeine) .[name].. 1-2 by mouth bid  prn headache 4)  Imitrex 100 Mg Tabs (Sumatriptan succinate) .[name].. 1 by mouth as needed headache 5)  Aspirin 81 Mg Tbec (Aspirin) .... Take one by mouth once daily 6)  Multivitamins Tabs (Multiple vitamin) .... Take one by mouth once daily 7)  Caltrate 600+d Plus 600-400 Mg-unit Tabs (Calcium carbonate-vit d-min) .... Take one by mouth once daily 8)  Omeprazole 40 Mg Cpdr (Omeprazole) .[name].. 1 once daily 9)  Pennsaid 1.5 % Soln (Diclofenac sodium) .... 3-5 gtt on skin three times a day for pain 10)  Tramadol Hcl 50 Mg Tabs (Tramadol hcl) .[name].. 1-2 tabs by mouth two times a day as needed pain 11)  First-bxn Mouthwash Susp (-lidocaine-nystatin)  .... Swish and swallow before meals 5cc's 12)  Vitamin B-12 500 Mcg Tabs (Cyanocobalamin) .[name].. 1 by mouth once daily for vitamin b12 deficiency 13)  Ciprofloxacin Hcl 250 Mg Tabs (Ciprofloxacin hcl) .[name].. 1 by mouth two times a day for cystitis  Other Orders: Tdap => 28yrs IM (95284) Admin 1st Vaccine (13244)  Patient Instructions: 1)  Please schedule a follow-up appointment in 6 months. Prescriptions: CIPROFLOXACIN HCL 250 MG TABS (CIPROFLOXACIN HCL)  1 by mouth two times a day for cystitis  #10 x 0   Entered and Authorized by:   Tresa Garter MD   Signed by:   Tresa Garter MD on 10/10/2009   Method used:   Electronically to        [facility] (retail)       [address]       [place], [place]  [address]       Ph: [phone]       Fax: [phone]   RxID:   [id number] VITAMIN B-12 500 MCG TABS (CYANOCOBALAMIN) 1 by mouth once daily  for Vitamin B12 deficiency  #30 x 12   Entered and Authorized by:   Tresa Garter MD   Signed by:   Tresa Garter MD on 10/10/2009   Method used:   Electronically to        [facility] (retail)       [address]       [place], [place]  [address]       Ph: [phone]       Fax: [phone]   RxID:   [id number] OMEPRAZOLE 40 MG CPDR (OMEPRAZOLE) 1 once daily  #90 x 3   Entered and Authorized by:   Tresa Garter MD   Signed by:   Tresa Garter MD on 10/09/2009   Method used:   Electronically to        [facility] (retail)       [address]       [place], [place]  [address]       Ph: [phone]       Fax: [phone]   RxID:   [id number] FIORICET 50-325-40 MG TABS Sarita Haver) 1-2 by mouth bid  prn headache  #60 x 3   Entered and Authorized by:   Tresa Garter MD   Signed by:   Tresa Garter MD on 10/09/2009   Method used:   Electronically to        [facility] (retail)       [address]       [place], [place]  [address]       Ph: [phone]       Fax: [phone]   RxID:   [id number] IMITREX 100 MG TABS (SUMATRIPTAN SUCCINATE) 1 by mouth as needed headache  #9 Tablet x 3   Entered and Authorized by:   Tresa Garter MD   Signed by:   Tresa Garter MD on 10/09/2009   Method used:   Electronically to        [facility] (retail)       [address]       [place], [place]  [address]       Ph: [phone]       Fax: [phone]   RxID:   [id number] CITALOPRAM HYDROBROMIDE 40 MG TABS (CITALOPRAM HYDROBROMIDE) 1 by mouth qd  #90 x 1   Entered and Authorized by:   Tresa Garter MD   Signed by:   Tresa Garter MD on 10/09/2009   Method used:   Electronically to        [facility] (retail)       [address]        [place], [place]  [address]       Ph: [phone]       Fax: [phone]   RxID:   [id number]    Immunizations Administered:  Tetanus Vaccine:    Vaccine Type: Tdap    Site: right deltoid    Mfr: GlaxoSmithKline    Dose: 0.5 ml    Route: IM    Given by: Lanier Prude, CMA(AAMA)    Exp. Date: 05/25/2011    Lot #: [id number]    VIS given: 01/18/07 version given October 09, 2009.

## 2010-04-03 NOTE — Assessment & Plan Note (Signed)
Summary: FLU SHOT / AVP Natale Milch   Nurse Visit   Allergies: 1)  Boniva (Ibandronate Sodium) 2)  Avastin (Bevacizumab)  Orders Added: 1)  Admin 1st Vaccine [90471] 2)  Flu Vaccine 72yrs + [956[address]]      Flu Vaccine Consent Questions     Do you have a history of severe allergic reactions to this vaccine? no    Any prior history of allergic reactions to egg and/or gelatin? no    Do you have a sensitivity to the preservative Thimersol? no    Do you have a past history of Guillan-Barre Syndrome? no    Do you currently have an acute febrile illness? no    Have you ever had a severe reaction to latex? no    Vaccine information given and explained to patient? yes    Are you currently pregnant? no    Lot Number:[id number]   Exp Date:08/30/2010   Site Given  Left Deltoid IM1

## 2010-04-03 NOTE — Letter (Signed)
[facility]   Imported By: Lester [place] 05/29/2009 07:55:08  _____________________________________________________________________  External Attachment:    Type:   Image     Comment:   External Document

## 2010-04-03 NOTE — Progress Notes (Signed)
Summary: U/S results  Phone Note Other Incoming   Caller: Pt  Summary of Call: Pt is requesting results of [place], please Advise in Dr Macario Golds absence. Initial call taken by: Ami Bullins CMA,  September 11, 2009 11:05 AM  Follow-up for Phone Call        the report states US shows no change in thyroid nodule size since 11/2008 [place] -  mgmt (if any needs to be done) will be deferred to AVP - no changes from my stand at this time - thanks Follow-up by: Newt Lukes MD,  September 11, 2009 12:55 PM  Additional Follow-up for Phone Call Additional follow up Details #1::        left mess for pt to call back. Lanier Prude, [facility])  September 11, 2009 3:30 PM   pt informed. Additional Follow-up by: Lanier Prude, [facility]),  September 11, 2009 4:44 PM

## 2010-04-03 NOTE — Letter (Signed)
Summary: [facility]   Imported By: Lester [place] 09/19/2009 10:38:42  _____________________________________________________________________  External Attachment:    Type:   Image     Comment:   External Document

## 2010-04-03 NOTE — Letter (Signed)
Summary: [facility]  [facility]   Imported By: Lennie Odor 01/13/2010 13:41:42  _____________________________________________________________________  External Attachment:    Type:   Image     Comment:   External Document

## 2010-04-03 NOTE — Progress Notes (Signed)
Summary: Citalopram request  Phone Note Refill Request Message from:  Patient on August 20, 2009 9:02 AM  Refills Requested: Medication #1:  CITALOPRAM HYDROBROMIDE 40 MG TABS 1 by mouth qd   Notes: last seen 01/2009 Office denied prescription stating that patient needed f/u appt. Patient states that she is leaving at 4pm for 2 weeks and will not have enough meds. Patient notes that she feels fine and requests med be refilled without office visit. Please advise.  Next Appointment Scheduled: NONE Initial call taken by: Lucious Groves,  August 20, 2009 9:02 AM  Follow-up for Phone Call        OK 3 months  Keep return office visit  Follow-up by: Tresa Garter MD,  August 20, 2009 9:34 AM  Additional Follow-up for Phone Call Additional follow up Details #1::        [facility]. Additional Follow-up by: Verdell Face,  August 20, 2009 12:47 PM    Additional Follow-up for Phone Call Additional follow up Details #2::    done, patient phone cut off. Left message on voicemail notifying. Follow-up by: Lucious Groves,  August 20, 2009 12:48 PM  Prescriptions: CITALOPRAM HYDROBROMIDE 40 MG TABS (CITALOPRAM HYDROBROMIDE) 1 by mouth qd  #30 Tablet x 3   Entered by:   Lucious Groves   Authorized by:   Tresa Garter MD   Signed by:   Lucious Groves on 08/20/2009   Method used:   Telephoned to ...       [facility] ([facility]ail)       [address]       [place], [place]  [address]       Ph: [phone]       Fax: [phone]   RxID:   [id number]

## 2010-04-03 NOTE — Letter (Signed)
[facility]   Imported By: Lester [place] 03/20/2009 07:09:24  _____________________________________________________________________  External Attachment:    Type:   Image     Comment:   External Document

## 2010-04-03 NOTE — Progress Notes (Signed)
Summary: Call Report  Phone Note Other Incoming   Caller: [facility] Summary of Call: [facility] Triage Call Report Triage Record Num: [id number] Operator: Geanie Berlin Patient Name: Brittany [name] Call Date & Time: 11/04/2009 4:43:34PM Patient Phone: [phone] PCP: [name] Patient Gender: Female PCP Fax : [phone] Patient DOB: 1945-10-24 Practice Name: [facility] Reason for Call: 65 yo. Calling re bilateral "black floaters" worse in L eye. Onset 11/04/09. Afebrile. No headache. Currently at their [facility]. Advised to see ED or UC within 4 hrs for sudden appearance of many floaters and flashes of light per Eye Pain or Vision Change Guideline. Protocol(s) Used: Eye: Pain or Vision Change Recommended Outcome per Protocol: See Provider within 4 hours Reason for Outcome: Sudden appearance of many floaters (dark, floating shapes), halos, spots, specks, lines, or flashes of light Care Advice:  ~ Another adult should drive.  ~ Do not drive or operate machinery while vision is impaired.  ~ Call your provider if you note increasing pain or any change in vision (such as double vision, blurred vision, etc.)  ~ Call provider if there is visual field loss, sudden decrease in visual acuity, severe eye pain, or severe headache.  ~ Wear dark UV-blocking sunglasses to protect eyes from sun exposure or for light sensitivity.  ~ SYMPTOM / CONDITION MANAGEMENT  ~ CAUTIONS  ~ List, or take, all current prescription(s), nonprescription or alternative medication(s) to provider for evaluation. 11/04/2009 4:53:46PM Page 1 of 1 CAN Initial call taken by: Margaret Pyle, CMA,  November 05, 2009 8:18 AM  Follow-up for Phone Call        noted. Thx Follow-up by: Tresa Garter MD,  November 05, 2009 8:31 AM

## 2010-04-03 NOTE — Letter (Signed)
[facility]   Imported By: Lester [place] 08/01/2009 09:28:00  _____________________________________________________________________  External Attachment:    Type:   Image     Comment:   External Document

## 2010-04-03 NOTE — Progress Notes (Signed)
Summary: RESULTS  Phone Note Call from Patient   Summary of Call: Patient is requesting a call back w/b-12 level.  Initial call taken by: Lamar Sprinkles, CMA,  March 11, 2010 9:39 AM  Follow-up for Phone Call        LMOVM for pt to call back.Marland KitchenMarland KitchenAlvy Beal Archie CMA  March 11, 2010 12:55 PM   Additional Follow-up for Phone Call Additional follow up Details #1::        Pt informed  Additional Follow-up by: Lamar Sprinkles, CMA,  March 12, 2010 3:20 PM

## 2010-04-03 NOTE — Progress Notes (Signed)
Summary: B12  Phone Note Call from Patient Call back at Home Phone [phone]   Summary of Call: Patient is requesting to know when she needs b12 labs checked again. I see 09/2009 append says recheck in 3 mths. Only b12 or is pt due for more labs?  Initial call taken by: Lamar Sprinkles, CMA,  February 11, 2010 9:20 AM  Follow-up for Phone Call        Yes, Vit B12 266.20 Thank you!  Follow-up by: Tresa Garter MD,  February 11, 2010 3:58 PM  Additional Follow-up for Phone Call Additional follow up Details #1::        Pt informed, please put above order in [facility], [facility]! Additional Follow-up by: Lamar Sprinkles, CMA,  February 12, 2010 9:34 AM    Additional Follow-up for Phone Call Additional follow up Details #2::    Order in [facility]. Follow-up by: Verdell Face,  February 12, 2010 9:53 AM

## 2010-04-03 NOTE — Letter (Signed)
[facility]   Imported By: Sherian Rein 04/25/2009 12:18:51  _____________________________________________________________________  External Attachment:    Type:   Image     Comment:   External Document

## 2010-04-03 NOTE — Assessment & Plan Note (Signed)
Summary: ZOSTAVAX-PER PT [employer] WILL PAY FOR INJ-AVP-STC   Nurse Visit   Allergies: 1)  Boniva (Ibandronate Sodium)  Immunizations Administered:  Zostavax # 1:    Vaccine Type: Zostavax    Site: left deltoid    Mfr: GlaxoSmithKline    Dose: 0.5 ml    Route: [place]    Given by: Lucious Groves    Exp. Date: 03/29/2010    Lot #: [id number]    VIS given: 12/12/04 given April 18, 2009.  Orders Added: 1)  Zoster (Shingles) Vaccine Live [90736] 2)  Admin 1st Vaccine [phone]

## 2010-04-08 ENCOUNTER — Encounter: Payer: Self-pay | Admitting: Internal Medicine

## 2010-04-23 NOTE — Letter (Signed)
Summary: Ranee Gosselin MD/[facility]  Ranee Gosselin MD/[facility]   Imported By: Lester [place] 04/18/2010 08:02:02  _____________________________________________________________________  External Attachment:    Type:   Image     Comment:   External Document

## 2010-04-24 ENCOUNTER — Ambulatory Visit: Payer: BC Managed Care – PPO | Attending: Radiation Oncology | Admitting: Radiation Oncology

## 2010-05-14 ENCOUNTER — Encounter: Payer: Self-pay | Admitting: Internal Medicine

## 2010-05-20 NOTE — Miscellaneous (Signed)
Summary: mammogram 2012   Clinical Lists Changes  Observations: Added new observation of MAMMOGRAM: normal (05/12/2010 17:31)      Preventive Care Screening  Mammogram:    Date:  05/12/2010    Results:  normal

## 2010-06-03 ENCOUNTER — Other Ambulatory Visit: Payer: Self-pay | Admitting: Oncology

## 2010-06-03 ENCOUNTER — Encounter (HOSPITAL_BASED_OUTPATIENT_CLINIC_OR_DEPARTMENT_OTHER): Payer: BC Managed Care – PPO | Admitting: Oncology

## 2010-06-03 DIAGNOSIS — C50419 Malignant neoplasm of upper-outer quadrant of unspecified female breast: Secondary | ICD-10-CM

## 2010-06-03 LAB — COMPREHENSIVE METABOLIC PANEL
ALT: 14 U/L (ref 0–35)
Alkaline Phosphatase: 63 U/L (ref 39–117)
Creatinine, Ser: 0.75 mg/dL (ref 0.40–1.20)
Glucose, Bld: 65 mg/dL — ABNORMAL LOW (ref 70–99)
Sodium: 136 mEq/L (ref 135–145)
Total Bilirubin: 0.4 mg/dL (ref 0.3–1.2)
Total Protein: 7.1 g/dL (ref 6.0–8.3)

## 2010-06-03 LAB — CANCER ANTIGEN 27.29: CA 27.29: 28 U/mL (ref 0–39)

## 2010-06-03 LAB — CBC WITH DIFFERENTIAL/PLATELET
BASO%: 0.6 % (ref 0.0–2.0)
HCT: 41.1 % (ref 34.8–46.6)
LYMPH%: 23.3 % (ref 14.0–49.7)
MCHC: 34 g/dL (ref 31.5–36.0)
MCV: 94.2 fL (ref 79.5–101.0)
MONO%: 7.4 % (ref 0.0–14.0)
NEUT%: 64.8 % (ref 38.4–76.8)
Platelets: 254 10*3/uL (ref 145–400)
RBC: 4.36 10*6/uL (ref 3.70–5.45)

## 2010-06-11 LAB — CBC
HCT: 36.3 % (ref 36.0–46.0)
Hemoglobin: 12.5 g/dL (ref 12.0–15.0)
MCHC: 34.3 g/dL (ref 30.0–36.0)
MCV: 96 fL (ref 78.0–100.0)
Platelets: 412 K/uL — ABNORMAL HIGH (ref 150–400)
RBC: 3.78 MIL/uL — ABNORMAL LOW (ref 3.87–5.11)
RDW: 17.5 % — ABNORMAL HIGH (ref 11.5–15.5)
WBC: 7.8 K/uL (ref 4.0–10.5)

## 2010-06-11 LAB — COMPREHENSIVE METABOLIC PANEL WITH GFR
ALT: 30 U/L (ref 0–35)
AST: 41 U/L — ABNORMAL HIGH (ref 0–37)
Albumin: 3.6 g/dL (ref 3.5–5.2)
Alkaline Phosphatase: 82 U/L (ref 39–117)
BUN: 9 mg/dL (ref 6–23)
CO2: 28 meq/L (ref 19–32)
Calcium: 9.1 mg/dL (ref 8.4–10.5)
Chloride: 100 meq/L (ref 96–112)
Creatinine, Ser: 0.6 mg/dL (ref 0.4–1.2)
GFR calc non Af Amer: >60 mL/min
Glucose, Bld: 100 mg/dL — ABNORMAL HIGH (ref 70–99)
Potassium: 4.6 meq/L (ref 3.5–5.1)
Sodium: 134 meq/L — ABNORMAL LOW (ref 135–145)
Total Bilirubin: 0.4 mg/dL (ref 0.3–1.2)
Total Protein: 6.8 g/dL (ref 6.0–8.3)

## 2010-06-11 LAB — URINALYSIS, ROUTINE W REFLEX MICROSCOPIC
Bilirubin Urine: NEGATIVE
Glucose, UA: NEGATIVE mg/dL
Hgb urine dipstick: NEGATIVE
Specific Gravity, Urine: 1.014 (ref 1.005–1.030)
pH: 6.5 (ref 5.0–8.0)

## 2010-06-11 LAB — URINE MICROSCOPIC-ADD ON

## 2010-06-11 LAB — DIFFERENTIAL
Basophils Relative: 1 % (ref 0–1)
Lymphs Abs: 1.1 10*3/uL (ref 0.7–4.0)
Monocytes Absolute: 0.7 10*3/uL (ref 0.1–1.0)
Monocytes Relative: 8 % (ref 3–12)
Neutro Abs: 4.1 10*3/uL (ref 1.7–7.7)
Neutrophils Relative %: 53 % (ref 43–77)

## 2010-07-15 NOTE — Op Note (Signed)
NAME:  [name], Brittany                  ACCOUNT NO.:  [id number]   MEDICAL RECORD NO.:  [id number]          PATIENT TYPE:  AMB   LOCATION:  DSC                          FACILITY:  [facility]   PHYSICIAN:  Currie Paris, M.D.DATE OF BIRTH:  [birth year]   DATE OF PROCEDURE:  DATE OF DISCHARGE:                               OPERATIVE REPORT   PREOPERATIVE DIAGNOSIS:  Carcinoma, right breast upper outer quadrant,  clinical stage II, now status post neoadjuvant chemotherapy.   POSTOPERATIVE DIAGNOSIS:  Carcinoma, right breast upper outer quadrant,  clinical stage II, now status post neoadjuvant chemotherapy.   OPERATION:  Right needle localized lumpectomy with blue dye injection  and axillary sentinel lymph node biopsy.   SURGEON:  Currie Paris, MD   ANESTHESIA:  General.   CLINICAL HISTORY:  Brittany [name] is a 62-year lady who has completed  neoadjuvant chemo with what appears to be an essentially complete  resolution of her cancer clinically.  She was scheduled for needle-  guided excision of the area of the original primary plus sentinel node  evaluation.   DESCRIPTION OF PROCEDURE:  The patient was seen in the holding area and  she had no further questions.  We initialed the right breast as the  operative site.  Her radioactive dye was injected by the Radiology  Department.   The patient was taken to the operating room and after satisfactory  general anesthesia had been obtained, the time-out was done.  The right  breast was prepped and draped and the blue dye injected using 5 mL  dilute methylene blue.   The entire breast was then prepped and draped.  The Neoprobe identified  the hot area in the axilla.   I made a transverse axillary incision and divided the subcutaneous  tissues and entered the axilla.  Almost immediately I saw blue lymphatic  leading to a blue lymph node and on retracting that up with a Babcock  and starting to excise that I found a second adjacent blue  lymph node  and both were taken out.  The first one had counts of about 280 and the  second one about 100.   With these two out, I carefully inspected for other blue lymph nodes or  blue dye and saw none.  There were no other hot areas with all counts  now dropping to 0 to 5.  There was no palpable adenopathy.   I made sure everything was dry and put small pack in.   I turned my attention to the breast.  The initial evaluation when I seen  her showed what appeared to be a mass starting at the areola and  extending laterally, but she had no residual mass in the clip, marking  the mass was really appeared near the areolar edge.  The guidewire went  in at the areolar edge and tracked towards the nipple.  I made a  curvilinear circumareolar incision at the areolar margin from about 11  o'clock to about 7 o'clock position.  I then teed off laterally, so that  I could be sure  we got well around where the palpable area had been at  her initial evaluation.  I raised the skin flaps medially going  underneath the nipple, then inferiorly and laterally.  I then divided  the breast tissue down to the chest Kunda just about the level of the  nipple and was right at the tip of the guidewire, which was well beyond  the clip.  I got down to the fascia and took the fascia and then excised  breast tissue going laterally both at the long superior and inferior  margins and deep taking the fascia until I was well lateral to the  guidewire entry point and I thought lateral to the area where I had  palpated something initially.  There was really no palpable abnormality  in this tissue.   I inked it for pathology and we took an x-ray to make sure that the clip  was in the middle and it appeared to be.   I put some Marcaine in and irrigated copiously.  I elevated the breast  off of the muscle and then freed up the skin a little bit more and then  closed the deep layer of the breast to try to recreate cavity  here.  I  then closed the subcu with some 3-0 Vicryl and the skin with a 4-0  Monocryl subcuticular.  This left very little deformity.   Attention was turned to the axilla.  Dr. Delila Spence called to report that  the two lymph nodes were negative by touch preps.  I then put some  Marcaine in here made sure everything was dry and closed with 3-0 Vicryl  and 4-0 Monocryl subcuticular plus Dermabond.   The patient tolerated the procedure well and there were no operative  complications.  All counts were correct.      Currie Paris, M.D.  Electronically Signed     CJS/MEDQ  D:  06/26/2008  T:  06/26/2008  Job:  [id number]   cc:   Georgina Quint. Plotnikov, MD  Valentino Hue. [name], M.D.

## 2010-07-15 NOTE — Op Note (Signed)
[name], [name]                  ACCOUNT NO.:  [id number]   MEDICAL RECORD NO.:  [id number]          PATIENT TYPE:  AMB   LOCATION:  DAY                          FACILITY:  [facility]   PHYSICIAN:  Currie Paris, M.D.DATE OF BIRTH:  05/31/1945   DATE OF PROCEDURE:  12/20/2007  DATE OF DISCHARGE:  12/20/2007                               OPERATIVE REPORT   CCS [id number]   PREOPERATIVE DIAGNOSIS:  Carcinoma of right breast, clinical stage II.   POSTOPERATIVE DIAGNOSIS:  Carcinoma of right breast, clinical stage II.   OPERATION:  Port-A-Cath placement and core biopsy of right breast  cancer.   SURGEON:  Currie Paris, M.D.   ANESTHESIA:  General (LMA).   CLINICAL HISTORY:  Brittany [name] is a 65 year old lady who presents with  about a 2 cm cancer in the upper outer but almost the midline of her  right breast 9 o'clock position.  She elected to proceed to neoadjuvant  therapy with the B40 study and a core biopsy with four cores to be taken  was needed and Port-A-Cath placement for IV chemo.   DESCRIPTION OF PROCEDURE:  The patient was seen in the holding area.  She had no further questions.  We confirmed that the right breast was  the side for the biopsy and that we would be putting a port in and try  the left side.  She had no further questions.   The patient was taken to the operating room; after a satisfactory  general anesthesia had been obtained a time-out was done.  I then  prepped the area of the right breast around the cancer with some  Betadine and draped it as a sterile field and then made a small nick at  the old core biopsy site.  I took four core biopsies of the tumor and  gave those to the research nurse.  Compression was held for a few  minutes until there was no bleeding.   The entire upper chest and lower neck were then prepped and draped  separately as a separate sterile field.  Using all different  instruments, etc. the Port-A-Cath placement was done.   I started by entering the left subclavian vein which I did on the  initial attempt.  The guidewire was threaded using fluoro and positioned  in the superior vena cava.   I infiltrated with some 0.25% plain Marcaine in the anterior chest Brittany [name]  and made a transverse incision and fashioned a pocket with cautery.  The  catheter tubing was then pulled from the port site to the guidewire  site.   The guidewire site was dilated once and then the dilator and guidewire  removed and the port catheter tubing advanced to 24 cm.  It aspirated  and flushed easily.  Using fluoro I could see that we were nicely in the  right atrium and this was backed up until I was at the junction of the  atrium and superior vena cava.   The reservoir was flushed and attached and a locking mechanism engaged.  This aspirated and flushed easily.  The reservoir was then sutured to the fascia using 2-0 Prolene and final  fluoro was made to make sure that we had no kinking or other issues and  everything looked appropriate.   The catheter was then aspirated, flushed with dilute followed by  concentrated aqueous heparin.  The incision was then closed with 3-0  Vicryl and 4-0 Monocryl subcuticular and Dermabond.   The patient tolerated the procedure well and there were no  complications.  All counts were correct.      Currie Paris, M.D.  Electronically Signed     CJS/MEDQ  D:  12/20/2007  T:  12/21/2007  Job:  [id number]   cc:   Georgina Quint. Plotnikov, MD  [address]  [place]  [place] [address]   Valentino Hue. [name], M.D.  Fax: [phone]   Dr Jeralyn Ruths

## 2010-07-15 NOTE — Letter (Signed)
December 23, 2006    [address]  [place], [place] [address]   RE:  SARAY, CAPASSO  MRN:  [id number]  /  DOB:  [birth year]   To whom it may concern:   Ms. Henthorn has asked me to elaborate on her state of health in respect to  the high health insurance premium.  I have known Ms. Carelli for a number  of years: she is a 65 year old female in general good health.   She has a mild case of bone loss (osteopenia); she is taking Fosamax,  calcium and vitamin D for it.   She has a history of mild essential tremor that she is not taking any  medicines for. This is not Parkinson's disease.   She has a history of situational anxiety, not on therapy.   She has a history of migraine headaches.  They are infrequent, no more  than moderate in severity.  Currently managed with occasional use of  Fioricet.   She has a history of situational depression.  Today we started her on  fluoxetine at low dose at 10-20 mg daily.  This is not expected to be a  lifelong therapy.   In my opinion, Ms. Pavlov has no major medical problems.  Her state of  health is good.  Her risk of serious health problems is not higher than  average for a woman her age.    Sincerely,      Georgina Quint. Plotnikov, MD  Electronically Signed    AVP/MedQ  DD: 12/23/2006  DT: 12/24/2006  Job #: [id number]

## 2010-07-24 ENCOUNTER — Ambulatory Visit
Admission: RE | Admit: 2010-07-24 | Discharge: 2010-07-24 | Disposition: A | Discharge status: Home / Self Care | Payer: BC Managed Care – PPO | Source: Ambulatory Visit | Attending: Radiation Oncology | Admitting: Radiation Oncology

## 2010-07-30 ENCOUNTER — Encounter (INDEPENDENT_AMBULATORY_CARE_PROVIDER_SITE_OTHER): Payer: Self-pay | Admitting: Surgery

## 2010-10-12 ENCOUNTER — Other Ambulatory Visit: Payer: Self-pay | Admitting: Internal Medicine

## 2010-10-14 ENCOUNTER — Ambulatory Visit (INDEPENDENT_AMBULATORY_CARE_PROVIDER_SITE_OTHER): Payer: BC Managed Care – PPO | Admitting: Surgery

## 2010-10-14 ENCOUNTER — Encounter (INDEPENDENT_AMBULATORY_CARE_PROVIDER_SITE_OTHER): Payer: Self-pay | Admitting: Surgery

## 2010-10-14 VITALS — BP 96/70 | HR 68 | Temp 96.8°F

## 2010-10-14 DIAGNOSIS — Z853 Personal history of malignant neoplasm of breast: Secondary | ICD-10-CM

## 2010-10-14 NOTE — Progress Notes (Signed)
10/14/2010  Brittany [name] is a 65 y.o.female who presents for routine followup of her Right breast diagnosed in September 20 and treated with neoadjuvant chemotherapy, lumpectomy,  And radiation therapy. She has no problems or concerns on either side.  PFSH She has had no significant changes since the last visit here.  ROS There have been no significant changes since the last visit here  General: The patient is alert, oriented, generally healty appearing, NAD. Mood and affect are normal.  Breasts : The right breast shows post-op deformity and is somewhat smaller than the left. There is no evidence of recurrence or new tumor. The left breast is normal Lymphatics: She has no axillary or supraclavicular adenopathy on either side.  Extremities: Full ROM of the surgical side with no lymphedema noted.  Data Reviewed: Mammogram reviewed and norma  Impression: No evidence of recurrence  Plan: Recheck in six months

## 2010-10-30 ENCOUNTER — Encounter (INDEPENDENT_AMBULATORY_CARE_PROVIDER_SITE_OTHER): Payer: Self-pay | Admitting: Oncology

## 2010-11-01 ENCOUNTER — Other Ambulatory Visit: Payer: Self-pay | Admitting: Internal Medicine

## 2010-11-11 ENCOUNTER — Other Ambulatory Visit (INDEPENDENT_AMBULATORY_CARE_PROVIDER_SITE_OTHER): Payer: BC Managed Care – PPO

## 2010-11-11 ENCOUNTER — Encounter: Payer: Self-pay | Admitting: Internal Medicine

## 2010-11-11 ENCOUNTER — Ambulatory Visit (INDEPENDENT_AMBULATORY_CARE_PROVIDER_SITE_OTHER): Payer: BC Managed Care – PPO | Admitting: Internal Medicine

## 2010-11-11 ENCOUNTER — Other Ambulatory Visit: Payer: Self-pay | Admitting: Internal Medicine

## 2010-11-11 ENCOUNTER — Other Ambulatory Visit: Payer: BC Managed Care – PPO

## 2010-11-11 VITALS — BP 100/70 | HR 76 | Temp 98.4°F | Resp 16 | Wt 137.0 lb

## 2010-11-11 DIAGNOSIS — F411 Generalized anxiety disorder: Secondary | ICD-10-CM

## 2010-11-11 DIAGNOSIS — F329 Major depressive disorder, single episode, unspecified: Secondary | ICD-10-CM

## 2010-11-11 DIAGNOSIS — Z23 Encounter for immunization: Secondary | ICD-10-CM

## 2010-11-11 DIAGNOSIS — E538 Deficiency of other specified B group vitamins: Secondary | ICD-10-CM

## 2010-11-11 DIAGNOSIS — Z0000 Encounter for general adult medical examination without abnormal findings: Secondary | ICD-10-CM

## 2010-11-11 DIAGNOSIS — F3289 Other specified depressive episodes: Secondary | ICD-10-CM

## 2010-11-11 DIAGNOSIS — K219 Gastro-esophageal reflux disease without esophagitis: Secondary | ICD-10-CM

## 2010-11-11 LAB — COMPREHENSIVE METABOLIC PANEL
ALT: 13 U/L (ref 0–35)
Albumin: 4.2 g/dL (ref 3.5–5.2)
CO2: 29 mEq/L (ref 19–32)
Chloride: 99 mEq/L (ref 96–112)
GFR: 116.13 mL/min (ref >60.00)
Glucose, Bld: 96 mg/dL (ref 70–99)
Potassium: 4.4 mEq/L (ref 3.5–5.1)
Sodium: 136 mEq/L (ref 135–145)
Total Bilirubin: 0.7 mg/dL (ref 0.3–1.2)
Total Protein: 7.4 g/dL (ref 6.0–8.3)

## 2010-11-11 LAB — URINALYSIS, ROUTINE W REFLEX MICROSCOPIC
Hgb urine dipstick: NEGATIVE
Nitrite: NEGATIVE
Specific Gravity, Urine: 1.01 (ref 1.000–1.030)
Total Protein, Urine: NEGATIVE
Urobilinogen, UA: 0.2 (ref 0.0–1.0)

## 2010-11-11 LAB — LDL CHOLESTEROL, DIRECT: Direct LDL: 163.5 mg/dL

## 2010-11-11 LAB — LIPID PANEL: HDL: 64.2 mg/dL (ref >39.00)

## 2010-11-11 LAB — TSH: TSH: 1.12 u[IU]/mL (ref 0.35–5.50)

## 2010-11-11 LAB — VITAMIN B12: Vitamin B-12: 1033 pg/mL — ABNORMAL HIGH (ref 211–911)

## 2010-11-11 MED ORDER — CITALOPRAM HYDROBROMIDE 40 MG PO TABS: 40.0000 mg | ORAL_TABLET | Freq: Every day | ORAL | Status: DC

## 2010-11-11 NOTE — Assessment & Plan Note (Signed)
Continue with current prescription therapy as reflected on the Med list.  

## 2010-11-11 NOTE — Progress Notes (Signed)
  Subjective:    Patient ID: Brittany [name], female    DOB: 1945/09/27, 65 y.o.   MRN: [id number]  HPI     The patient is here to follow up on chronic depression, anxiety, B12 deficiency.    Review of Systems  Constitutional: Negative for chills, activity change, appetite change, fatigue and unexpected weight change.  HENT: Negative for congestion, mouth sores and sinus pressure.   Eyes: Negative for visual disturbance.  Respiratory: Negative for cough and chest tightness.   Gastrointestinal: Negative for nausea and abdominal pain.  Genitourinary: Negative for frequency, difficulty urinating and vaginal pain.  Musculoskeletal: Negative for back pain and gait problem.  Skin: Negative for pallor and rash.  Neurological: Negative for dizziness, tremors, weakness, numbness and headaches.  Psychiatric/Behavioral: Negative for suicidal ideas, confusion and sleep disturbance.       Objective:   Physical Exam  Constitutional: She appears well-developed and well-nourished. No distress.  HENT:  Head: Normocephalic.  Right Ear: External ear normal.  Left Ear: External ear normal.  Nose: Nose normal.  Mouth/Throat: Oropharynx is clear and moist.  Eyes: Conjunctivae are normal. Pupils are equal, round, and reactive to light. Right eye exhibits no discharge. Left eye exhibits no discharge.  Neck: Normal range of motion. Neck supple. No JVD present. No tracheal deviation present. No thyromegaly present.  Cardiovascular: Normal rate, regular rhythm and normal heart sounds.   Pulmonary/Chest: No stridor. No respiratory distress. She has no wheezes.  Abdominal: Soft. Bowel sounds are normal. She exhibits no distension and no mass. There is no tenderness. There is no rebound and no guarding.  Musculoskeletal: She exhibits no edema and no tenderness.  Lymphadenopathy:    She has no cervical adenopathy.  Neurological: She displays normal reflexes. No cranial nerve deficit. She exhibits normal  muscle tone. Coordination normal.  Skin: No rash noted. No erythema.  Psychiatric: She has a normal mood and affect. Her behavior is normal. Judgment and thought content normal.          Assessment & Plan:

## 2010-11-11 NOTE — Assessment & Plan Note (Signed)
Continue with current prescription therapy as reflected on the Med list - citalopram

## 2010-11-12 ENCOUNTER — Telehealth: Payer: Self-pay | Admitting: Internal Medicine

## 2010-11-12 NOTE — Telephone Encounter (Signed)
Brittany [name], please, inform patient that all labs are normal except for elev B12 (take 1/2 dose) and a little elev chol. Good chol is high however.  Please, mail the labs to the patient.    Thx

## 2010-11-12 NOTE — Telephone Encounter (Signed)
Pt informed. Labs mailed.

## 2010-12-04 ENCOUNTER — Other Ambulatory Visit: Payer: Self-pay | Admitting: Oncology

## 2010-12-04 ENCOUNTER — Encounter (HOSPITAL_BASED_OUTPATIENT_CLINIC_OR_DEPARTMENT_OTHER): Payer: BC Managed Care – PPO | Admitting: Oncology

## 2010-12-04 DIAGNOSIS — C50419 Malignant neoplasm of upper-outer quadrant of unspecified female breast: Secondary | ICD-10-CM

## 2010-12-04 LAB — CBC WITH DIFFERENTIAL/PLATELET
Basophils Absolute: 0 10*3/uL (ref 0.0–0.1)
EOS%: 6.1 % (ref 0.0–7.0)
Eosinophils Absolute: 0.3 10*3/uL (ref 0.0–0.5)
HCT: 36 % (ref 34.8–46.6)
HGB: 12.4 g/dL (ref 11.6–15.9)
MCH: 32.8 pg (ref 25.1–34.0)
MONO#: 0.4 10*3/uL (ref 0.1–0.9)
NEUT#: 2.9 10*3/uL (ref 1.5–6.5)
NEUT%: 58.7 % (ref 38.4–76.8)
RDW: 13.5 % (ref 11.2–14.5)
lymph#: 1.3 10*3/uL (ref 0.9–3.3)

## 2010-12-04 LAB — COMPREHENSIVE METABOLIC PANEL
Albumin: 4.2 g/dL (ref 3.5–5.2)
BUN: 12 mg/dL (ref 6–23)
Calcium: 9.3 mg/dL (ref 8.4–10.5)
Chloride: 103 mEq/L (ref 96–112)
Creatinine, Ser: 0.8 mg/dL (ref 0.50–1.10)
Glucose, Bld: 86 mg/dL (ref 70–99)
Potassium: 4.1 mEq/L (ref 3.5–5.3)

## 2010-12-04 LAB — CANCER ANTIGEN 27.29: CA 27.29: 15 U/mL (ref 0–39)

## 2010-12-11 ENCOUNTER — Encounter (HOSPITAL_BASED_OUTPATIENT_CLINIC_OR_DEPARTMENT_OTHER): Payer: BC Managed Care – PPO | Admitting: Oncology

## 2010-12-11 DIAGNOSIS — Z853 Personal history of malignant neoplasm of breast: Secondary | ICD-10-CM

## 2011-01-15 ENCOUNTER — Other Ambulatory Visit: Payer: Self-pay | Admitting: Internal Medicine

## 2011-01-15 ENCOUNTER — Telehealth: Payer: Self-pay | Admitting: *Deleted

## 2011-01-15 ENCOUNTER — Other Ambulatory Visit: Payer: BC Managed Care – PPO

## 2011-01-15 ENCOUNTER — Ambulatory Visit (INDEPENDENT_AMBULATORY_CARE_PROVIDER_SITE_OTHER): Payer: BC Managed Care – PPO | Admitting: Internal Medicine

## 2011-01-15 ENCOUNTER — Telehealth: Payer: Self-pay | Admitting: Internal Medicine

## 2011-01-15 DIAGNOSIS — N3090 Cystitis, unspecified without hematuria: Secondary | ICD-10-CM | POA: Insufficient documentation

## 2011-01-15 LAB — URINALYSIS, ROUTINE W REFLEX MICROSCOPIC
Nitrite: NEGATIVE
Specific Gravity, Urine: <=1.005 (ref 1.000–1.030)
Urobilinogen, UA: 0.2 (ref 0.0–1.0)

## 2011-01-15 MED ORDER — CIPROFLOXACIN HCL 250 MG PO TABS: 250.0000 mg | ORAL_TABLET | Freq: Two times a day (BID) | ORAL | Status: AC

## 2011-01-15 NOTE — Progress Notes (Signed)
  Subjective:    Patient ID: Brittany [name], female    DOB: 1945/12/17, 65 y.o.   MRN: [id number]  HPI   C/o urgency, burning, pain starting this am Review of Systems  Constitutional: Negative for fever and chills.  Gastrointestinal: Negative for abdominal pain.  Genitourinary: Positive for dysuria and frequency. Negative for vaginal pain.  Musculoskeletal: Negative for arthralgias.       Objective:   Physical Exam  Constitutional: She appears well-developed and well-nourished. No distress.  HENT:  Head: Normocephalic.  Right Ear: External ear normal.  Left Ear: External ear normal.  Nose: Nose normal.  Mouth/Throat: Oropharynx is clear and moist.  Eyes: Conjunctivae are normal. Pupils are equal, round, and reactive to light. Right eye exhibits no discharge. Left eye exhibits no discharge.  Neck: Normal range of motion. Neck supple. No JVD present. No tracheal deviation present. No thyromegaly present.  Cardiovascular: Normal rate, regular rhythm and normal heart sounds.   Pulmonary/Chest: No stridor. No respiratory distress. She has no wheezes.  Abdominal: Soft. Bowel sounds are normal. She exhibits no distension and no mass. There is tenderness (suprapubic area is tender). There is no rebound and no guarding.  Musculoskeletal: She exhibits no edema and no tenderness.  Lymphadenopathy:    She has no cervical adenopathy.  Neurological: She displays normal reflexes. No cranial nerve deficit. She exhibits normal muscle tone. Coordination normal.  Skin: No rash noted. No erythema.  Psychiatric: She has a normal mood and affect. Her behavior is normal. Judgment and thought content normal.          Assessment & Plan:

## 2011-01-15 NOTE — Patient Instructions (Signed)
Call if not better 

## 2011-01-15 NOTE — Telephone Encounter (Signed)
Pt left vm stating she thinks she has UTI. She c/o urinary urgency and burning. No openings today with any MD. Rip Harbour to add on today per AVP. Pt advised to be here at 1 pm today.

## 2011-01-15 NOTE — Telephone Encounter (Signed)
Brittany [name] , please, inform the patient: UTI    Thank you !

## 2011-01-15 NOTE — Assessment & Plan Note (Signed)
UA Cipro bid 250 mg Vesicare 5 mg qd prn samples

## 2011-01-16 NOTE — Telephone Encounter (Signed)
Return call to patient per MD//pt informed

## 2011-02-02 ENCOUNTER — Other Ambulatory Visit: Payer: Self-pay | Admitting: Internal Medicine

## 2011-02-14 ENCOUNTER — Other Ambulatory Visit: Payer: Self-pay | Admitting: Internal Medicine

## 2011-03-26 ENCOUNTER — Ambulatory Visit
Admission: RE | Admit: 2011-03-26 | Discharge: 2011-03-26 | Disposition: A | Discharge status: Home / Self Care | Payer: BC Managed Care – PPO | Source: Ambulatory Visit | Attending: Radiation Oncology | Admitting: Radiation Oncology

## 2011-03-26 ENCOUNTER — Encounter: Payer: Self-pay | Admitting: Radiation Oncology

## 2011-03-26 ENCOUNTER — Telehealth: Payer: Self-pay | Admitting: Oncology

## 2011-03-26 ENCOUNTER — Encounter (INDEPENDENT_AMBULATORY_CARE_PROVIDER_SITE_OTHER): Payer: Self-pay | Admitting: Surgery

## 2011-03-26 ENCOUNTER — Telehealth: Payer: Self-pay | Admitting: *Deleted

## 2011-03-26 DIAGNOSIS — Z853 Personal history of malignant neoplasm of breast: Secondary | ICD-10-CM

## 2011-03-26 NOTE — Telephone Encounter (Signed)
S/w shirley from rad onc regarding the pt needing an April appt with dr Chrissie Noa. gve shirlwey the appt over the phone per dr Michell Heinrich

## 2011-03-26 NOTE — Progress Notes (Signed)
Here for fu of right breast ca today with no c/o.  Skin looks great

## 2011-03-26 NOTE — Progress Notes (Signed)
CC:   Brittany Quint. Plotnikov, MD Currie Paris, M.D. Lowella Dell, M.D.  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  PREVIOUS RADIATION:  63 Gy completed 09/11/2008.  INTERVAL SINCE TREATMENT:  2-1/2 years.  INTERVAL HISTORY:  Brittany [name] reports for followup today.  She is feeling well and doing well.  She has no breast related complaints.  She is normal and active with her grandchildren in [place].  She had a negative mammogram in March of last year and is scheduled again in March.  She is not sure when her follow up with Dr. Darnelle Catalan is but has a follow up with Dr. Jamey Ripa at the end of February.  PHYSICAL EXAMINATION:  She is a pleasant female in no distress, sitting comfortably on the exam room table.  Her weight is 136 pounds, which is stable.  She has no palpable cervical or supraclavicular adenopathy. Examination of the right breast reveals some asymmetry.  Her scar is well healed, with a palpable nonpainful scar tissue below the scar.  No palpable abnormality to the left breast.  IMPRESSION:  Right breast cancer with no evidence of disease.  RECOMMENDATIONS:  Brittany [name] looks great.  She has a mammogram scheduled for March.  She has follow up with Dr. Darnelle Catalan following that.  She really like to be followed every 3 months so I have scheduled her for follow up with Dr. Darnelle Catalan in March, with Dr. Jamey Ripa in August or September and with me in January.  She knows to contact me with any questions or concerns in the interim.    ______________________________ Lurline Hare, M.D. SW/MEDQ  D:  03/26/2011  T:  03/26/2011  Job:  [id number]

## 2011-04-07 ENCOUNTER — Telehealth: Payer: Self-pay | Admitting: Oncology

## 2011-04-07 NOTE — Telephone Encounter (Signed)
S/w pt, gave lab appt 4/16 @ 1.45pm and md appt 4/23 @ 11.15am.

## 2011-04-24 NOTE — Telephone Encounter (Signed)
xxx

## 2011-05-22 ENCOUNTER — Ambulatory Visit (INDEPENDENT_AMBULATORY_CARE_PROVIDER_SITE_OTHER): Payer: BC Managed Care – PPO | Admitting: Surgery

## 2011-05-22 ENCOUNTER — Encounter: Payer: Self-pay | Admitting: Internal Medicine

## 2011-06-08 ENCOUNTER — Ambulatory Visit: Payer: BC Managed Care – PPO | Admitting: Oncology

## 2011-06-09 LAB — HM COLONOSCOPY: HM Colonoscopy: NORMAL

## 2011-06-16 ENCOUNTER — Other Ambulatory Visit: Payer: BC Managed Care – PPO | Admitting: Lab

## 2011-06-17 ENCOUNTER — Telehealth: Payer: Self-pay | Admitting: Oncology

## 2011-06-17 NOTE — Telephone Encounter (Signed)
Pt called in to r/s her lab appt that she cancelled on 06/16/2011

## 2011-06-18 ENCOUNTER — Other Ambulatory Visit: Payer: BC Managed Care – PPO

## 2011-06-22 ENCOUNTER — Other Ambulatory Visit (HOSPITAL_BASED_OUTPATIENT_CLINIC_OR_DEPARTMENT_OTHER): Payer: BC Managed Care – PPO

## 2011-06-22 DIAGNOSIS — C50919 Malignant neoplasm of unspecified site of unspecified female breast: Secondary | ICD-10-CM

## 2011-06-22 LAB — CBC WITH DIFFERENTIAL/PLATELET
BASO%: 0.6 % (ref 0.0–2.0)
Eosinophils Absolute: 0.1 10*3/uL (ref 0.0–0.5)
HCT: 40.3 % (ref 34.8–46.6)
MCHC: 33.8 g/dL (ref 31.5–36.0)
MONO#: 0.3 10*3/uL (ref 0.1–0.9)
NEUT#: 3.2 10*3/uL (ref 1.5–6.5)
NEUT%: 61.8 % (ref 38.4–76.8)
Platelets: 251 10*3/uL (ref 145–400)
WBC: 5.1 10*3/uL (ref 3.9–10.3)
lymph#: 1.5 10*3/uL (ref 0.9–3.3)
nRBC: 0 % (ref 0–0)

## 2011-06-22 LAB — COMPREHENSIVE METABOLIC PANEL
ALT: 10 U/L (ref 0–35)
AST: 17 U/L (ref 0–37)
Albumin: 4.4 g/dL (ref 3.5–5.2)
BUN: 9 mg/dL (ref 6–23)
CO2: 26 mEq/L (ref 19–32)
Calcium: 10.1 mg/dL (ref 8.4–10.5)
Chloride: 101 mEq/L (ref 96–112)
Potassium: 3.9 mEq/L (ref 3.5–5.3)

## 2011-06-23 ENCOUNTER — Ambulatory Visit (HOSPITAL_BASED_OUTPATIENT_CLINIC_OR_DEPARTMENT_OTHER): Payer: BC Managed Care – PPO | Admitting: Physician Assistant

## 2011-06-23 ENCOUNTER — Telehealth: Payer: Self-pay | Admitting: Oncology

## 2011-06-23 ENCOUNTER — Encounter: Payer: Self-pay | Admitting: Physician Assistant

## 2011-06-23 VITALS — BP 114/73 | HR 66 | Temp 98.4°F | Ht 67.0 in | Wt 136.4 lb

## 2011-06-23 DIAGNOSIS — Z853 Personal history of malignant neoplasm of breast: Secondary | ICD-10-CM

## 2011-06-23 DIAGNOSIS — C50519 Malignant neoplasm of lower-outer quadrant of unspecified female breast: Secondary | ICD-10-CM

## 2011-06-23 DIAGNOSIS — M810 Age-related osteoporosis without current pathological fracture: Secondary | ICD-10-CM

## 2011-06-23 NOTE — Progress Notes (Signed)
ID: Brittany [name]   DOB: 12/02/1945  MR#: [id number]  WUJ#:[id number]  HISTORY OF PRESENT ILLNESS: The patient noted a mass in her right breast and immediately brought it to Dr. Cherlyn Labella attention 11/22/2007.  I should note that the patient had a negative routine screening mammogram at [facility] on 07/19/2007.  In the 11/22/2007 study, there was at least a 1.5-cm ill-defined density seen primarily on the right MLO view.  There were a few associated microcalcifications.  By palpation, Dr. Yolanda Bonine felt this was approximately 1.9 cm.  By ultrasound, this was an irregular hypoechoic mass measuring up to 1.9 cm.  There were a few irregular vessels present by Doppler.  There were no abnormal lymph nodes noted.  Breast specific gamma imaging was performed the next day, 11/23/2007, and showed a normal left breast.  On the right, there was a 1.6-cm high-density focus of abnormal isotope activity.  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  HER2-neu was negative at 1+.    The patient was treated in the neoadjuvant setting with docetaxel/gemcitabine/bevacizumab followed by doxorubicin/cyclophosphamide/bevacizumab according to the NSABP B-40 protocol. Last bevacizumab dose was in January 2010.  Patient underwent right lumpectomy and sentinel lymph node dissection in April of 2010, with a complete pathologic response. This was followed by radiation, completed July 2010. Now followed with observation alone.  INTERVAL HISTORY: Brittany [name]  returns today for routine six-month followup of her right breast carcinoma. Interval history is unremarkable. Brittany [name] herself continues to be very busy. 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  Physically, she really has  no complaints today.  REVIEW OF SYSTEMS: Brittany [name] denies any recent illnesses. She's had no fevers or chills. No rashes or abnormal bleeding. No nausea or change in bowel habits. No chest pain, cough, or shortness of breath. No abnormal headaches or dizziness. No unusual myalgias, arthralgias, or bony pain.  A detailed review of systems is otherwise noncontributory.  PAST MEDICAL HISTORY: Past Medical History  Diagnosis Date  . Cancer     breast right    PAST SURGICAL HISTORY: Past Surgical History  Procedure Date  . Lumoectomy April 2010    FAMILY HISTORY Family History  Problem Relation Age of Onset  . Heart disease Father   . Cancer Sister     Breast & Colorectal    GYNECOLOGIC HISTORY: She is GX P3, first pregnancy to term age 27, last menstrual period when she was 66 years old.  She never took any hormones.    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  Their son, Brittany [name], works for [employer], an Electronics engineer, and is working for his [employer]; he has 2 children and lives in [place], right outside of [place].  They have a daughter, Brittany [name], who lives in [place] and has 3 children, and a son, Brittany [name].  The patient attends the [employer].    ADVANCED DIRECTIVES:  HEALTH MAINTENANCE: History  Substance Use Topics  . Smoking status: Former Games developer  . Smokeless tobacco: Not on file  . Alcohol Use: Yes     1 beer  Colonoscopy:  PAP:  Bone density:  Lipid panel:  Allergies  Allergen Reactions  . Ibandronate Sodium     REACTION: intolerance    Current Outpatient Prescriptions  Medication Sig Dispense Refill  . ASPIRIN PO Take by mouth.        . Cholecalciferol (VITAMIN D-3 PO) Take by mouth.        . citalopram (CELEXA) 40 MG tablet Take 1 tablet (40 mg total) by mouth daily.  90 tablet  1  .  Cyanocobalamin (VITAMIN B-12 PO) Take by mouth.        . Omega-3 Fatty Acids (FISH OIL) 1000 MG CAPS Take 1 capsule by mouth daily.        [name] OMEPRAZOLE PO Take by mouth daily.        . SUMAtriptan (IMITREX) 100 MG tablet TAKE ONE TABLET BY MOUTH AS NEEDED FOR HEADACHE.  9 tablet  3  . DISCONTD: citalopram (CELEXA) 40 MG tablet TAKE ONE TABLET BY MOUTH ONE TIME DAILY  90 tablet  1    OBJECTIVE: Filed Vitals:   06/23/11 1123  BP: 114/73  Pulse: 66  Temp: 98.4 F (36.9 C)     Body mass index is 21.36 kg/(m^2).    ECOG FS: 0  Physical Exam: HEENT:  Sclerae anicteric, conjunctivae pink.  Oropharynx clear.     Nodes:  No cervical, supraclavicular, or axillary lymphadenopathy palpated.  Breast Exam: Right breast is status post lumpectomy, well-healed incision. No suspicious nodularity or skin changes. No evidence of local recurrence. Left breast is benign no masses, skin changes, or nipple inversion.   Lungs:  Clear to auscultation bilaterally.  No crackles, rhonchi, or wheezes.   Heart:  Regular rate and rhythm.   Abdomen:  Soft, nontender.  Positive bowel sounds.  No organomegaly or masses palpated.   Musculoskeletal:  No focal spinal tenderness to palpation.  Extremities:  Benign.  No peripheral edema or cyanosis.   Skin:  Benign.   Neuro:  Nonfocal.  Alert and oriented x3.    LAB RESULTS: Lab Results  Component Value Date   WBC 5.1 06/22/2011   NEUTROABS 3.2 06/22/2011   HGB 13.6 06/22/2011   HCT 40.3 06/22/2011   MCV 95.6 06/22/2011   PLT 251 06/22/2011      Chemistry      Component Value Date/Time   NA 137 06/22/2011 1318   K 3.9 06/22/2011 1318   CL 101 06/22/2011 1318   CO2 26 06/22/2011 1318   BUN 9 06/22/2011 1318   CREATININE 0.66 06/22/2011 1318      Component Value Date/Time   CALCIUM 10.1 06/22/2011 1318   ALKPHOS 57 06/22/2011 1318   AST 17 06/22/2011 1318   ALT 10 06/22/2011 1318   BILITOT 0.4 06/22/2011 1318       Lab Results  Component Value Date   LABCA2 15  12/04/2010   LABCA2 15 12/04/2010     STUDIES: This recent bilateral mammogram at [facility] on 05/13/2011 was unremarkable.  Most recent bone density at [facility] on 05/13/2011 showed osteoporosis.  ASSESSMENT: A 66 year old [place] woman   (1)  History of right breast cancer, measuring at 2 cm by MRI at time of diagnosis, triple-negative and had an MIB-1 of 50%.   (2)  Treated with neoadjuvant chemotherapy, which consisted of docetaxel/gemcitabine/bevacizumab followed by doxorubicin/cyclophosphamide/bevacizumab as per the NSABP B-40 protocol.  Last bevacizumab dose was January of 2010.    (3)  status post right lumpectomy and sentinel lymph node dissection  in April of 2010, with  a complete pathologic response.   (4)  She completed radiation July of 2010.   PLAN: Brittany [name] continues to do extremely well with regards to her breast cancer, and there is no clinical evidence of disease recurrence. She is already scheduled to see Dr. Darnelle Catalan in August, and we will repeat labs prior to that visit.  Patient voices understanding and agreement with our plan, and will call any changes or problems.  Brittany [name]    06/23/2011

## 2011-06-23 NOTE — Telephone Encounter (Signed)
gve the pt her aug 2013 appt calendar 

## 2011-07-30 ENCOUNTER — Encounter (INDEPENDENT_AMBULATORY_CARE_PROVIDER_SITE_OTHER): Payer: Self-pay | Admitting: Surgery

## 2011-07-30 ENCOUNTER — Ambulatory Visit (INDEPENDENT_AMBULATORY_CARE_PROVIDER_SITE_OTHER): Payer: BC Managed Care – PPO | Admitting: Surgery

## 2011-07-30 VITALS — BP 98/62 | HR 72 | Temp 97.2°F | Ht 67.0 in | Wt 136.6 lb

## 2011-07-30 DIAGNOSIS — Z853 Personal history of malignant neoplasm of breast: Secondary | ICD-10-CM

## 2011-07-30 NOTE — Progress Notes (Signed)
07/30/2011  Brittany [name] is a 66 y.o.female who presents for routine followup of her Right breast cancer, IDC, diagnosed in September 20 and treated with neoadjuvant chemotherapy, lumpectomy,  And radiation therapy. She has no problems or concerns on either side.  PFSH She has had no significant changes since the last visit here.  ROS There have been no significant changes since the last visit here  General: The patient is alert, oriented, generally healty appearing, NAD. Mood and affect are normal.  Breasts : The right breast shows post-op deformity and is somewhat smaller than the left. There is no evidence of recurrence or new tumor. The left breast is normal Lymphatics: She has no axillary or supraclavicular adenopathy on either side.  Extremities: Full ROM of the surgical side with no lymphedema noted.  Data Reviewed: Mammogram reviewed and normal  Impression: No evidence of recurrence  Plan: Recheck in six months

## 2011-07-31 ENCOUNTER — Ambulatory Visit (INDEPENDENT_AMBULATORY_CARE_PROVIDER_SITE_OTHER): Payer: BC Managed Care – PPO | Admitting: Surgery

## 2011-08-01 ENCOUNTER — Other Ambulatory Visit: Payer: Self-pay | Admitting: Internal Medicine

## 2011-10-05 ENCOUNTER — Other Ambulatory Visit (HOSPITAL_BASED_OUTPATIENT_CLINIC_OR_DEPARTMENT_OTHER): Payer: BC Managed Care – PPO | Admitting: Lab

## 2011-10-05 DIAGNOSIS — Z853 Personal history of malignant neoplasm of breast: Secondary | ICD-10-CM

## 2011-10-05 DIAGNOSIS — C50519 Malignant neoplasm of lower-outer quadrant of unspecified female breast: Secondary | ICD-10-CM

## 2011-10-05 LAB — CBC WITH DIFFERENTIAL/PLATELET
BASO%: 0.9 % (ref 0.0–2.0)
EOS%: 6 % (ref 0.0–7.0)
MCH: 32.5 pg (ref 25.1–34.0)
MCHC: 34.2 g/dL (ref 31.5–36.0)
MCV: 94.9 fL (ref 79.5–101.0)
MONO%: 7.7 % (ref 0.0–14.0)
RBC: 4.07 10*6/uL (ref 3.70–5.45)
RDW: 13.4 % (ref 11.2–14.5)

## 2011-10-05 LAB — COMPREHENSIVE METABOLIC PANEL
ALT: 9 U/L (ref 0–35)
AST: 15 U/L (ref 0–37)
Albumin: 4.2 g/dL (ref 3.5–5.2)
Alkaline Phosphatase: 58 U/L (ref 39–117)
BUN: 8 mg/dL (ref 6–23)
Potassium: 3.7 mEq/L (ref 3.5–5.3)
Sodium: 139 mEq/L (ref 135–145)
Total Protein: 7 g/dL (ref 6.0–8.3)

## 2011-10-05 LAB — CANCER ANTIGEN 27.29: CA 27.29: 26 U/mL (ref 0–39)

## 2011-10-12 ENCOUNTER — Other Ambulatory Visit: Payer: Self-pay | Admitting: *Deleted

## 2011-10-12 ENCOUNTER — Telehealth: Payer: Self-pay | Admitting: *Deleted

## 2011-10-12 ENCOUNTER — Telehealth: Payer: Self-pay | Admitting: Oncology

## 2011-10-12 ENCOUNTER — Ambulatory Visit (HOSPITAL_BASED_OUTPATIENT_CLINIC_OR_DEPARTMENT_OTHER): Payer: BC Managed Care – PPO | Admitting: Oncology

## 2011-10-12 VITALS — BP 136/73 | HR 68 | Temp 98.4°F | Resp 20 | Ht 67.0 in | Wt 144.1 lb

## 2011-10-12 DIAGNOSIS — M810 Age-related osteoporosis without current pathological fracture: Secondary | ICD-10-CM

## 2011-10-12 DIAGNOSIS — C50919 Malignant neoplasm of unspecified site of unspecified female breast: Secondary | ICD-10-CM | POA: Insufficient documentation

## 2011-10-12 DIAGNOSIS — C50519 Malignant neoplasm of lower-outer quadrant of unspecified female breast: Secondary | ICD-10-CM

## 2011-10-12 DIAGNOSIS — Z171 Estrogen receptor negative status [ER-]: Secondary | ICD-10-CM

## 2011-10-12 MED ORDER — CITALOPRAM HYDROBROMIDE 40 MG PO TABS: 40.0000 mg | ORAL_TABLET | Freq: Every day | ORAL | Status: DC

## 2011-10-12 NOTE — Telephone Encounter (Signed)
R'cd fax from [facility] for refill of Citalopram.

## 2011-10-12 NOTE — Telephone Encounter (Signed)
gve the pt her dec 2013 appt calendar. Pt is aware that we will contact her regarding the zometa appt. Sent michelle a staff message on this pt

## 2011-10-12 NOTE — Progress Notes (Signed)
ID: Brittany [name]   DOB: March 22, 1945  MR#: [id number]  CSN#:[id number]  HISTORY OF PRESENT ILLNESS: The patient noted a mass in her right breast and immediately brought it to Dr. Cherlyn [name] attention 11/22/2007.  I should note that the patient had a negative routine screening mammogram at [facility] on 07/19/2007.  In the 11/22/2007 study, there was at least a 1.5-cm ill-defined density seen primarily on the right MLO view.  There were a few associated microcalcifications.  By palpation, Dr. Yolanda [name] felt this was approximately 1.9 cm.  By ultrasound, this was an irregular hypoechoic mass measuring up to 1.9 cm.  There were a few irregular vessels present by Doppler.  There were no abnormal lymph nodes noted.  Breast specific gamma imaging was performed the next day, 11/23/2007, and showed a normal left breast.  On the right, there was a 1.6-cm high-density focus of abnormal isotope activity.  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  HER2-neu was negative at 1+. Her subsequent history is as detailed below.  INTERVAL HISTORY: Brittany [name] returns today for followup of her breast cancer. The interval history has been generally unremarkable. She's been hanging out at the [facility], "doing nothing", and feeling great. She is getting back to work early September. Her son from [place] is visiting late September together with multiple [place] and relatives. She is a ready planning but they're going to be eating, which she tells me is going to be "standard [place] fare".  REVIEW OF SYSTEMS: A detailed review of systems today was entirely noncontributory  PAST MEDICAL HISTORY: Past Medical History  Diagnosis Date  . Cancer     breast right  Significant for osteopenia, history of hiatal hernia with esophageal stricture,  status post upper endoscopy under [facility] 10/10/2007 showing an erosive gastritis in a background of reactive gastropathy ([id number]).  She has a history of rare migraines, and when she was 17, she had a benign growth removed from the back of her right calf.    PAST SURGICAL HISTORY: Past Surgical History  Procedure Date  . Lumoectomy April 2010  . Breast surgery [id number]    lumpectomy    FAMILY HISTORY Family History  Problem Relation Age of Onset  . Heart disease Father   . Cancer Sister     Breast & Colorectal  The patient's father died from lung cancer at the age of 41; he was not a smoker.  The patient's mother is alive at age 53; she has epilepsy.  The patient has a sister diagnosed with breast cancer when she was 35.  She is doing well.  The patient has a brother in good health.  There is no other history of breast or ovarian cancer in the family.  GYNECOLOGIC HISTORY: She is GX P3, first pregnancy to term age 61, last menstrual period when she was 66 years old.  She never took any hormones.     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  Their son, Brittany [name], works for [employer], an Electronics engineer, and is working for his [employer]; he has 2 children and lives in [place], right outside of [place].  They have a daughter, Brittany [name], who lives in [place] and  has 3 children, and a son, Brittany [name], who just married a young woman from [place] and is planning to move to [place] around Christmas time.  The patient attends the [employer] [employer].   ADVANCED DIRECTIVES:  HEALTH MAINTENANCE: History  Substance Use Topics  . Smoking status: Former Games developer  . Smokeless tobacco: Not on file  . Alcohol Use: Yes     1 beer     Colonoscopy:  PAP:  Bone density:  Lipid panel:  Allergies  Allergen Reactions  . Ibandronate Sodium      REACTION: intolerance    Current Outpatient Prescriptions  Medication Sig Dispense Refill  . ASPIRIN PO Take by mouth.        . Cholecalciferol (VITAMIN D-3 PO) Take by mouth.        . citalopram (CELEXA) 40 MG tablet Take 1 tablet (40 mg total) by mouth daily.  90 tablet  1  . Omega-3 Fatty Acids (FISH OIL) 1000 MG CAPS Take 1 capsule by mouth daily.        [name] OMEPRAZOLE PO Take by mouth daily.        . SUMAtriptan (IMITREX) 100 MG tablet TAKE ONE TABLET BY MOUTH AS NEEDED FOR HEADACHE.  9 tablet  3  . DISCONTD: citalopram (CELEXA) 40 MG tablet TAKE ONE TABLET BY MOUTH ONE TIME DAILY  90 tablet  1    OBJECTIVE: Middle-aged white woman who appears well Filed Vitals:   10/12/11 1142  BP: 136/73  Pulse: 68  Temp: 98.4 F (36.9 C)  Resp: 20     Body mass index is 22.57 kg/(m^2).    ECOG FS: 0  Sclerae unicteric Oropharynx clear No cervical or supraclavicular adenopathy Lungs no rales or rhonchi Heart regular rate and rhythm Abd benign MSK no focal spinal tenderness, no peripheral edema Neuro: nonfocal Breasts: The right breast is status post lumpectomy and radiation. There is no evidence of local recurrence. The right axilla shows no suspicious masses. The left breast is unremarkable   LAB RESULTS: Lab Results  Component Value Date   WBC 5.0 10/05/2011   NEUTROABS 2.9 10/05/2011   HGB 13.2 10/05/2011   HCT 38.6 10/05/2011   MCV 94.9 10/05/2011   PLT 264 10/05/2011      Chemistry      Component Value Date/Time   NA 139 10/05/2011 1317   K 3.7 10/05/2011 1317   CL 102 10/05/2011 1317   CO2 27 10/05/2011 1317   BUN 8 10/05/2011 1317   CREATININE 0.65 10/05/2011 1317      Component Value Date/Time   CALCIUM 9.7 10/05/2011 1317   ALKPHOS 58 10/05/2011 1317   AST 15 10/05/2011 1317   ALT 9 10/05/2011 1317   BILITOT 0.4 10/05/2011 1317       Lab Results  Component Value Date   LABCA2 26 10/05/2011    No components found with this basename: [id number]    No results found for this basename:  INR:1;PROTIME:1 in the last 168 hours  Urinalysis    Component Value Date/Time   COLORURINE LT. YELLOW 01/15/2011 1345   APPEARANCEUR CLEAR 01/15/2011 1345   LABSPEC <=1.005 01/15/2011 1345   PHURINE 6.0 01/15/2011 1345   GLUCOSEU NEGATIVE 06/22/2008 0955   HGBUR LARGE 01/15/2011 1345   BILIRUBINUR NEGATIVE 01/15/2011 1345   KETONESUR NEGATIVE 01/15/2011 1345   PROTEINUR NEGATIVE 06/22/2008 0955   UROBILINOGEN 0.2 01/15/2011 1345   NITRITE NEGATIVE 01/15/2011 1345   LEUKOCYTESUR MODERATE 01/15/2011 1345  STUDIES: Bone density 05/2011 showed osteoporosis. Mammopgram at [facility] same month showed extremely dense breasts, no evidence of disease.  ASSESSMENT: 66 y.o. 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  (2) status post  neoadjuvant chemotherapy, which consisted of docetaxel/gemcitabine/bevacizumab followed by doxorubicin/cyclophosphamide/bevacizumab as per the NSABP B-40 protocol.  Last bevacizumab dose was January of 2010.   (3) status-post right lumpectomy and sentinel lymph node dissection in April of 2010 showing a complete pathologic response   (4) She completed radiation July of 2010.   PLAN: She is doing very well from a breast cancer point of view. We worry about her very dense breasts, but are not sure exactly how to followup on this. We could consider yearly MRIs, or MRIs alternating with BSGI. A second issue is osteoporosis, noted on her bone density from earlier this year. She was previously on Actonel and Fosamax, and tolerated these poorly. We discussed moving to zoledronic acid, and she is interested in pursuing that. We went over the possible toxicities, side effects and complications and specifically the association with osteonecrosis of the jaw, which would be I think unlikely in her situation. The plan is for her to receive zoledronic acid on September  5. She has been instructed to take some TUMS the day before, the day of and the day after to see if we can avoid some of the common flulike symptoms associated with that infusion.Otherwise she will see [place] again early December. She knows to call for any problems that may develop before then.   Kerrin Markman C    10/12/2011

## 2011-10-12 NOTE — Telephone Encounter (Signed)
Per staff message and POF I have scheduled appts.  JMW  

## 2011-11-05 ENCOUNTER — Ambulatory Visit: Payer: BC Managed Care – PPO

## 2011-12-01 ENCOUNTER — Ambulatory Visit (INDEPENDENT_AMBULATORY_CARE_PROVIDER_SITE_OTHER): Payer: BC Managed Care – PPO | Admitting: Internal Medicine

## 2011-12-01 ENCOUNTER — Ambulatory Visit (INDEPENDENT_AMBULATORY_CARE_PROVIDER_SITE_OTHER)
Admission: RE | Admit: 2011-12-01 | Discharge: 2011-12-01 | Disposition: A | Discharge status: Home / Self Care | Payer: BC Managed Care – PPO | Source: Ambulatory Visit | Attending: Internal Medicine | Admitting: Internal Medicine

## 2011-12-01 ENCOUNTER — Telehealth: Payer: Self-pay | Admitting: Internal Medicine

## 2011-12-01 ENCOUNTER — Encounter: Payer: Self-pay | Admitting: Internal Medicine

## 2011-12-01 VITALS — BP 118/70 | HR 80 | Temp 98.2°F | Resp 16 | Wt 135.0 lb

## 2011-12-01 DIAGNOSIS — R05 Cough: Secondary | ICD-10-CM

## 2011-12-01 DIAGNOSIS — G252 Other specified forms of tremor: Secondary | ICD-10-CM

## 2011-12-01 DIAGNOSIS — Z23 Encounter for immunization: Secondary | ICD-10-CM

## 2011-12-01 DIAGNOSIS — Z87891 Personal history of nicotine dependence: Secondary | ICD-10-CM

## 2011-12-01 DIAGNOSIS — G43809 Other migraine, not intractable, without status migrainosus: Secondary | ICD-10-CM

## 2011-12-01 DIAGNOSIS — H6120 Impacted cerumen, unspecified ear: Secondary | ICD-10-CM

## 2011-12-01 DIAGNOSIS — C50919 Malignant neoplasm of unspecified site of unspecified female breast: Secondary | ICD-10-CM

## 2011-12-01 DIAGNOSIS — K219 Gastro-esophageal reflux disease without esophagitis: Secondary | ICD-10-CM

## 2011-12-01 DIAGNOSIS — E538 Deficiency of other specified B group vitamins: Secondary | ICD-10-CM

## 2011-12-01 MED ORDER — BENZONATATE 200 MG PO CAPS: 200.0000 mg | ORAL_CAPSULE | Freq: Two times a day (BID) | ORAL | Status: DC | PRN

## 2011-12-01 MED ORDER — RANITIDINE HCL 150 MG PO TABS: 150.0000 mg | ORAL_TABLET | Freq: Two times a day (BID) | ORAL | Status: DC

## 2011-12-01 MED ORDER — PROMETHAZINE-CODEINE 6.25-10 MG/5ML PO SYRP: 5.0000 mL | ORAL_SOLUTION | ORAL | Status: AC | PRN

## 2011-12-01 MED ORDER — SUMATRIPTAN SUCCINATE 100 MG PO TABS: 100.0000 mg | ORAL_TABLET | Freq: Every day | ORAL | Status: DC | PRN

## 2011-12-01 MED ORDER — FLUTICASONE-SALMETEROL 100-50 MCG/DOSE IN AEPB: 1.0000 | INHALATION_SPRAY | Freq: Two times a day (BID) | RESPIRATORY_TRACT | Status: DC

## 2011-12-01 MED ORDER — DOXYCYCLINE HYCLATE 100 MG PO TABS: 100.0000 mg | ORAL_TABLET | Freq: Two times a day (BID) | ORAL | Status: DC

## 2011-12-01 NOTE — Assessment & Plan Note (Signed)
Continue with current prescription therapy as reflected on the Med list.  

## 2011-12-01 NOTE — Assessment & Plan Note (Signed)
2009 R breast

## 2011-12-01 NOTE — Assessment & Plan Note (Signed)
Resolved

## 2011-12-01 NOTE — Assessment & Plan Note (Signed)
Very remote - quit 40 y ago

## 2011-12-01 NOTE — Patient Instructions (Addendum)
Stop fish oil, Omeprazole Start Ranitidine, Advair

## 2011-12-01 NOTE — Telephone Encounter (Signed)
Brittany [name], please, inform patient that the x ray was ok. Use Rx. Keep ROV Thx

## 2011-12-01 NOTE — Progress Notes (Signed)
Subjective:    Patient ID: Brittany [name], female    DOB: [birth year], 66 y.o.   MRN: [id number]  Cough This is a chronic problem. The current episode started more than 1 month ago (x 6 mo). The problem has been rapidly worsening. The problem occurs constantly. The cough is non-productive. Pertinent negatives include no chills, fever, headaches, rash, shortness of breath or weight loss. Nothing aggravates the symptoms. She has tried nothing for the symptoms. The treatment provided no relief. There is no history of asthma, bronchitis or COPD.    The patient is here to follow up on chronic depression, anxiety, B12 deficiency.  Wt Readings from Last 3 Encounters:  12/01/11 135 lb (61.236 kg)  10/12/11 144 lb 1.6 oz (65.363 kg)  07/30/11 136 lb 9.6 oz (61.961 kg)   BP Readings from Last 3 Encounters:  12/01/11 118/70  10/12/11 136/73  07/30/11 98/62      Review of Systems  Constitutional: Negative for fever, chills, weight loss, activity change, appetite change, fatigue and unexpected weight change.  HENT: Negative for congestion, mouth sores and sinus pressure.   Eyes: Negative for visual disturbance.  Respiratory: Positive for cough. Negative for chest tightness and shortness of breath.   Gastrointestinal: Negative for nausea and abdominal pain.  Genitourinary: Negative for frequency, difficulty urinating and vaginal pain.  Musculoskeletal: Negative for back pain and gait problem.  Skin: Negative for pallor and rash.  Neurological: Negative for dizziness, tremors, weakness, numbness and headaches.  Psychiatric/Behavioral: Negative for suicidal ideas, confusion and disturbed wake/sleep cycle.       Objective:   Physical Exam  Constitutional: She appears well-developed and well-nourished. No distress.  HENT:  Head: Normocephalic.  Right Ear: External ear normal.  Left Ear: External ear normal.  Nose: Nose normal.  Mouth/Throat: Oropharynx is clear and moist.  Eyes:  Conjunctivae normal are normal. Pupils are equal, round, and reactive to light. Right eye exhibits no discharge. Left eye exhibits no discharge.  Neck: Normal range of motion. Neck supple. No JVD present. No tracheal deviation present. No thyromegaly present.  Cardiovascular: Normal rate, regular rhythm and normal heart sounds.   Pulmonary/Chest: No stridor. No respiratory distress. She has no wheezes.  Abdominal: Soft. Bowel sounds are normal. She exhibits no distension and no mass. There is no tenderness. There is no rebound and no guarding.  Musculoskeletal: She exhibits no edema and no tenderness.  Lymphadenopathy:    She has no cervical adenopathy.  Neurological: She displays normal reflexes. No cranial nerve deficit. She exhibits normal muscle tone. Coordination normal.  Skin: No rash noted. No erythema.  Psychiatric: She has a normal mood and affect. Her behavior is normal. Judgment and thought content normal.  Wax B   Procedure Note :     Procedure :  Ear irrigation   Indication:  Cerumen impaction B   Risks, including pain, dizziness, eardrum perforation, bleeding, infection and others as well as benefits were explained to the patient in detail. Verbal consent was obtained and the patient agreed to proceed.    We used "The [employer] Device" field with lukewarm water for irrigation. A large amount wax was recovered. Procedure has also required manual wax removal with an ear loop.   Tolerated well. Complications: None.   Postprocedure instructions :  Call if problems.   I personally provided the Advair disk use teaching. After the teaching patient was able to demonstrate it's use effectively. All questions were answered  Assessment & Plan:

## 2011-12-02 NOTE — Telephone Encounter (Signed)
Pt's husband informed.

## 2011-12-17 ENCOUNTER — Encounter: Payer: Self-pay | Admitting: Internal Medicine

## 2011-12-17 ENCOUNTER — Ambulatory Visit (INDEPENDENT_AMBULATORY_CARE_PROVIDER_SITE_OTHER): Payer: BC Managed Care – PPO | Admitting: Internal Medicine

## 2011-12-17 VITALS — BP 110/64 | HR 68 | Temp 98.5°F | Resp 16 | Wt 137.0 lb

## 2011-12-17 DIAGNOSIS — J3089 Other allergic rhinitis: Secondary | ICD-10-CM | POA: Insufficient documentation

## 2011-12-17 DIAGNOSIS — R05 Cough: Secondary | ICD-10-CM

## 2011-12-17 DIAGNOSIS — E785 Hyperlipidemia, unspecified: Secondary | ICD-10-CM

## 2011-12-17 DIAGNOSIS — K219 Gastro-esophageal reflux disease without esophagitis: Secondary | ICD-10-CM

## 2011-12-17 DIAGNOSIS — Z0000 Encounter for general adult medical examination without abnormal findings: Secondary | ICD-10-CM

## 2011-12-17 DIAGNOSIS — J309 Allergic rhinitis, unspecified: Secondary | ICD-10-CM

## 2011-12-17 DIAGNOSIS — E538 Deficiency of other specified B group vitamins: Secondary | ICD-10-CM

## 2011-12-17 MED ORDER — CITALOPRAM HYDROBROMIDE 40 MG PO TABS: 40.0000 mg | ORAL_TABLET | Freq: Every day | ORAL | Status: DC

## 2011-12-17 MED ORDER — AZELASTINE-FLUTICASONE 137-50 MCG/ACT NA SUSP: 1.0000 | NASAL | Status: DC

## 2011-12-17 MED ORDER — RANITIDINE HCL 150 MG PO TABS: 150.0000 mg | ORAL_TABLET | Freq: Two times a day (BID) | ORAL | Status: DC

## 2011-12-17 NOTE — Patient Instructions (Addendum)
Afrin as needed °

## 2011-12-17 NOTE — Assessment & Plan Note (Addendum)
Continue with Advair - better

## 2011-12-17 NOTE — Progress Notes (Signed)
Patient ID: Brittany [name], female   DOB: Jul 23, 1945, 66 y.o.   MRN: [id number]   Subjective:    Patient ID: Brittany [name], female    DOB: 18-Jun-1945, 66 y.o.   MRN: [id number]  Cough This is a chronic problem. The current episode started more than 1 month ago (x 6 mo). The problem has been rapidly improving. The problem occurs every few hours. The cough is non-productive. Associated symptoms include rhinorrhea. Pertinent negatives include no chills, fever, headaches, rash, shortness of breath or weight loss. Nothing aggravates the symptoms. She has tried nothing for the symptoms. The treatment provided significant relief. There is no history of asthma, bronchitis or COPD.    The patient is here to follow up on chronic depression, anxiety, B12 deficiency.  Wt Readings from Last 3 Encounters:  12/17/11 137 lb (62.143 kg)  12/01/11 135 lb (61.236 kg)  10/12/11 144 lb 1.6 oz (65.363 kg)   BP Readings from Last 3 Encounters:  12/17/11 110/64  12/01/11 118/70  10/12/11 136/73      Review of Systems  Constitutional: Negative for fever, chills, weight loss, activity change, appetite change, fatigue and unexpected weight change.  HENT: Positive for congestion and rhinorrhea. Negative for mouth sores and sinus pressure.   Eyes: Negative for visual disturbance.  Respiratory: Positive for cough. Negative for chest tightness and shortness of breath.   Gastrointestinal: Negative for nausea and abdominal pain.  Genitourinary: Negative for frequency, difficulty urinating and vaginal pain.  Musculoskeletal: Negative for back pain and gait problem.  Skin: Negative for pallor and rash.  Neurological: Negative for dizziness, tremors, weakness, numbness and headaches.  Psychiatric/Behavioral: Negative for suicidal ideas, confusion and disturbed wake/sleep cycle.       Objective:   Physical Exam  Constitutional: She appears well-developed and well-nourished. No distress.  HENT:  Head: Normocephalic.    Right Ear: External ear normal.  Left Ear: External ear normal.  Nose: Nose normal.  Mouth/Throat: Oropharynx is clear and moist.       Pale nasal mucosa  Eyes: Conjunctivae normal are normal. Pupils are equal, round, and reactive to light. Right eye exhibits no discharge. Left eye exhibits no discharge.  Neck: Normal range of motion. Neck supple. No JVD present. No tracheal deviation present. No thyromegaly present.  Cardiovascular: Normal rate, regular rhythm and normal heart sounds.   Pulmonary/Chest: No stridor. No respiratory distress. She has no wheezes.  Abdominal: Soft. Bowel sounds are normal. She exhibits no distension and no mass. There is no tenderness. There is no rebound and no guarding.  Musculoskeletal: She exhibits no edema and no tenderness.  Lymphadenopathy:    She has no cervical adenopathy.  Neurological: She displays normal reflexes. No cranial nerve deficit. She exhibits normal muscle tone. Coordination normal.  Skin: No rash noted. No erythema.  Psychiatric: She has a normal mood and affect. Her behavior is normal. Judgment and thought content normal.             Assessment & Plan:

## 2011-12-17 NOTE — Assessment & Plan Note (Signed)
Depo 120 mg - bad now... Dymista Declined allergy tests

## 2011-12-18 ENCOUNTER — Telehealth: Payer: Self-pay | Admitting: *Deleted

## 2011-12-18 MED ORDER — METHYLPREDNISOLONE ACETATE 80 MG/ML IJ SUSP
120.0000 mg | Freq: Once | INTRAMUSCULAR | Status: DC
  Administered 2011-12-18: 120 mg via INTRAMUSCULAR

## 2011-12-18 NOTE — Assessment & Plan Note (Signed)
Will switch to Ranitidine

## 2011-12-18 NOTE — Assessment & Plan Note (Signed)
10/17 chronic Declined statins

## 2011-12-18 NOTE — Assessment & Plan Note (Signed)
Continue with current prescription therapy as reflected on the Med list.  

## 2011-12-18 NOTE — Telephone Encounter (Signed)
Patient called and left message to call her back. I have called her bac. She needs to reschedule her missed zometa. I have rescheduled the appt to next week. The patient aware. Desk RN notified. JMW

## 2011-12-21 ENCOUNTER — Other Ambulatory Visit: Payer: Self-pay | Admitting: *Deleted

## 2011-12-22 ENCOUNTER — Ambulatory Visit (HOSPITAL_BASED_OUTPATIENT_CLINIC_OR_DEPARTMENT_OTHER): Payer: BC Managed Care – PPO

## 2011-12-22 DIAGNOSIS — M810 Age-related osteoporosis without current pathological fracture: Secondary | ICD-10-CM

## 2011-12-22 MED ORDER — SODIUM CHLORIDE 0.9 % IV SOLN
Freq: Once | INTRAVENOUS | Status: AC
  Administered 2011-12-22: 10:00:00 via INTRAVENOUS

## 2011-12-22 MED ORDER — ZOLEDRONIC ACID 4 MG/5ML IV CONC
4.0000 mg | Freq: Once | INTRAVENOUS | Status: AC
  Administered 2011-12-22: 4 mg via INTRAVENOUS
  Filled 2011-12-22: qty 5

## 2011-12-22 NOTE — Patient Instructions (Signed)
Zoledronic Acid injection (Hypercalcemia, Oncology) What is this medicine? ZOLEDRONIC ACID (ZOE le dron ik AS id) lowers the amount of calcium loss from bone. It is used to treat too much calcium in your blood from cancer. It is also used to prevent complications of cancer that has spread to the bone. This medicine may be used for other purposes; ask your health care provider or pharmacist if you have questions. What should I tell my health care provider before I take this medicine? They need to know if you have any of these conditions: -aspirin-sensitive asthma -dental disease -kidney disease -an unusual or allergic reaction to zoledronic acid, other medicines, foods, dyes, or preservatives -pregnant or trying to get pregnant -breast-feeding How should I use this medicine? This medicine is for infusion into a vein. It is given by a health care professional in a hospital or clinic setting. Talk to your pediatrician regarding the use of this medicine in children. Special care may be needed. Overdosage: If you think you have taken too much of this medicine contact a poison control center or emergency room at once. NOTE: This medicine is only for you. Do not share this medicine with others. What if I miss a dose? It is important not to miss your dose. Call your doctor or health care professional if you are unable to keep an appointment. What may interact with this medicine? -certain antibiotics given by injection -NSAIDs, medicines for pain and inflammation, like ibuprofen or naproxen -some diuretics like bumetanide, furosemide -teriparatide -thalidomide This list may not describe all possible interactions. Give your health care provider a list of all the medicines, herbs, non-prescription drugs, or dietary supplements you use. Also tell them if you smoke, drink alcohol, or use illegal drugs. Some items may interact with your medicine. What should I watch for while using this medicine? Visit  your doctor or health care professional for regular checkups. It may be some time before you see the benefit from this medicine. Do not stop taking your medicine unless your doctor tells you to. Your doctor may order blood tests or other tests to see how you are doing. Women should inform their doctor if they wish to become pregnant or think they might be pregnant. There is a potential for serious side effects to an unborn child. Talk to your health care professional or pharmacist for more information. You should make sure that you get enough calcium and vitamin D while you are taking this medicine. Discuss the foods you eat and the vitamins you take with your health care professional. Some people who take this medicine have severe bone, joint, and/or muscle pain. This medicine may also increase your risk for a broken thigh bone. Tell your doctor right away if you have pain in your upper leg or groin. Tell your doctor if you have any pain that does not go away or that gets worse. What side effects may I notice from receiving this medicine? Side effects that you should report to your doctor or health care professional as soon as possible: -allergic reactions like skin rash, itching or hives, swelling of the face, lips, or tongue -anxiety, confusion, or depression -breathing problems -changes in vision -feeling faint or lightheaded, falls -jaw burning, cramping, pain -muscle cramps, stiffness, or weakness -trouble passing urine or change in the amount of urine Side effects that usually do not require medical attention (report to your doctor or health care professional if they continue or are bothersome): -bone, joint, or muscle pain -  fever -hair loss -irritation at site where injected -loss of appetite -nausea, vomiting -stomach upset -tired This list may not describe all possible side effects. Call your doctor for medical advice about side effects. You may report side effects to FDA at  1-800-FDA-1088. Where should I keep my medicine? This drug is given in a hospital or clinic and will not be stored at home. NOTE: This sheet is a summary. It may not cover all possible information. If you have questions about this medicine, talk to your doctor, pharmacist, or health care provider.  2012, Elsevier/Gold Standard. (08/15/2010 9:06:58 AM) 

## 2012-02-02 ENCOUNTER — Ambulatory Visit (INDEPENDENT_AMBULATORY_CARE_PROVIDER_SITE_OTHER): Payer: BC Managed Care – PPO | Admitting: Surgery

## 2012-02-02 ENCOUNTER — Encounter (INDEPENDENT_AMBULATORY_CARE_PROVIDER_SITE_OTHER): Payer: Self-pay | Admitting: Surgery

## 2012-02-02 VITALS — BP 128/86 | HR 60 | Resp 14 | Ht 67.0 in | Wt 137.0 lb

## 2012-02-02 DIAGNOSIS — Z853 Personal history of malignant neoplasm of breast: Secondary | ICD-10-CM

## 2012-02-02 NOTE — Progress Notes (Signed)
02/02/2012  Brittany [name] is a 66 y.o.female who presents for routine followup of her Right breast cancer, IDC, diagnosed in September 2009 and treated with neoadjuvant chemotherapy, lumpectomy, and radiation therapy. She has no problems or concerns on either side.  PFSH She has had no significant changes since the last visit here.  ROS There have been no significant changes since the last visit here  General: The patient is alert, oriented, generally healty appearing, NAD. Mood and affect are normal.  Breasts : The right breast shows post-op deformity and is somewhat smaller than the left. There is no evidence of recurrence or new tumor. The left breast is normal Lymphatics: She has no axillary or supraclavicular adenopathy on either side.  Extremities: Full ROM of the surgical side with no lymphedema noted.  Data Reviewed: Mammogram not due till March  Impression: No evidence of recurrence  Plan: Recheck in six months

## 2012-02-02 NOTE — Patient Instructions (Signed)
Continue annual mammograms See me again in six months, or sooner if you have any problems

## 2012-02-03 ENCOUNTER — Other Ambulatory Visit: Payer: BC Managed Care – PPO | Admitting: Lab

## 2012-02-03 ENCOUNTER — Ambulatory Visit: Payer: BC Managed Care – PPO | Admitting: Physician Assistant

## 2012-03-21 ENCOUNTER — Encounter: Payer: Self-pay | Admitting: Physician Assistant

## 2012-03-21 ENCOUNTER — Ambulatory Visit (HOSPITAL_BASED_OUTPATIENT_CLINIC_OR_DEPARTMENT_OTHER): Payer: BC Managed Care – PPO | Admitting: Physician Assistant

## 2012-03-21 ENCOUNTER — Telehealth: Payer: Self-pay | Admitting: Oncology

## 2012-03-21 ENCOUNTER — Other Ambulatory Visit: Payer: Self-pay | Admitting: Internal Medicine

## 2012-03-21 ENCOUNTER — Other Ambulatory Visit: Payer: Self-pay | Admitting: Physician Assistant

## 2012-03-21 ENCOUNTER — Other Ambulatory Visit (HOSPITAL_BASED_OUTPATIENT_CLINIC_OR_DEPARTMENT_OTHER): Payer: BC Managed Care – PPO | Admitting: Lab

## 2012-03-21 VITALS — BP 113/72 | HR 74 | Temp 97.8°F | Resp 20 | Ht 67.0 in | Wt 136.0 lb

## 2012-03-21 DIAGNOSIS — Z853 Personal history of malignant neoplasm of breast: Secondary | ICD-10-CM

## 2012-03-21 DIAGNOSIS — M810 Age-related osteoporosis without current pathological fracture: Secondary | ICD-10-CM

## 2012-03-21 DIAGNOSIS — C50919 Malignant neoplasm of unspecified site of unspecified female breast: Secondary | ICD-10-CM

## 2012-03-21 LAB — BASIC METABOLIC PANEL (CC13)
BUN: 11 mg/dL (ref 7.0–26.0)
Creatinine: 0.7 mg/dL (ref 0.6–1.1)

## 2012-03-21 LAB — HEPATIC FUNCTION PANEL
Albumin: 4.6 g/dL (ref 3.5–5.2)
Bilirubin, Direct: 0.1 mg/dL (ref 0.0–0.3)
Total Bilirubin: 0.4 mg/dL (ref 0.3–1.2)

## 2012-03-21 LAB — CBC WITH DIFFERENTIAL/PLATELET
BASO%: 1.1 % (ref 0.0–2.0)
LYMPH%: 23.8 % (ref 14.0–49.7)
MCHC: 34 g/dL (ref 31.5–36.0)
MCV: 94 fL (ref 79.5–101.0)
MONO%: 6.6 % (ref 0.0–14.0)
Platelets: 254 10*3/uL (ref 145–400)
RBC: 4.31 10*6/uL (ref 3.70–5.45)
RDW: 13.5 % (ref 11.2–14.5)
WBC: 5.7 10*3/uL (ref 3.9–10.3)

## 2012-03-21 NOTE — Telephone Encounter (Signed)
gv pt appt schedule for July and September. S/w Brittany [name] @ [facility] and pt already on schedule for mammo 05/13/12 @ 10:15 am. Pt made aware of mammo appt.

## 2012-03-21 NOTE — Progress Notes (Signed)
ID: Brittany [name]   DOB: Oct 20, 1945  MR#: [id number]  AOZ#:[id number]  HISTORY OF PRESENT ILLNESS: The patient noted a mass in her right breast and immediately brought it to Dr. Cherlyn Labella attention 11/22/2007.  I should note that the patient had a negative routine screening mammogram at [facility] on 07/19/2007.  In the 11/22/2007 study, there was at least a 1.5-cm ill-defined density seen primarily on the right MLO view.  There were a few associated microcalcifications.  By palpation, Dr. Yolanda Bonine felt this was approximately 1.9 cm.  By ultrasound, this was an irregular hypoechoic mass measuring up to 1.9 cm.  There were a few irregular vessels present by Doppler.  There were no abnormal lymph nodes noted.  Breast specific gamma imaging was performed the next day, 11/23/2007, and showed a normal left breast.  On the right, there was a 1.6-cm high-density focus of abnormal isotope activity.  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  HER2-neu was negative at 1+. Her subsequent history is as detailed below.  INTERVAL HISTORY: Brittany [name] returns today for followup of her right breast cancer. The interval history has been generally unremarkable, and Brittany [name] is feeling well. She had a great holiday season spending time with her family. She is sad that one of her daughters who has 3 children will be relocating to [facility], but is glad that they were all able to spend the holiday together.  Brittany [name] received her first dose of zoledronic acid in September 2013 and tolerated the treatment well. She had no bony aches and pains or any additional side effects. She also denies any jaw pain.  REVIEW OF SYSTEMS: Brittany [name] has had no recent illnesses and denies fevers or chills. She denies skin changes, abnormal bruising, or  bleeding. She's had no problems swallowing and is eating and drinking well with no nausea or change in bowel or bladder habits. She's had no cough, phlegm production, increased shortness of breath, or chest pain. She has occasional migraines which are well-controlled. She's had no dizziness or change in vision, and denies any unusual myalgias, arthralgias, bony pain, or peripheral swelling.  A detailed review of systems is otherwise noncontributory.    PAST MEDICAL HISTORY: Past Medical History  Diagnosis Date  . Cancer     breast right  Significant for osteopenia, history of hiatal hernia with esophageal stricture, status post upper endoscopy under [facility] 10/10/2007 showing an erosive gastritis in a background of reactive gastropathy ([id number]).  She has a history of rare migraines, and when she was 17, she had a benign growth removed from the back of her right calf.    PAST SURGICAL HISTORY: Past Surgical History  Procedure Date  . Breast lumpectomy 06/26/2008    right    FAMILY HISTORY Family History  Problem Relation Age of Onset  . Heart disease Father   . Cancer Sister     Breast & Colorectal  The patient's father died from lung cancer at the age of 83; he was not a smoker.  The patient's mother is alive at age 45; she has epilepsy.  The patient has a sister diagnosed with breast cancer when she was 27.  She is doing well.  The patient has a brother in good health.  There is no other history of breast or ovarian cancer in the  family.  GYNECOLOGIC HISTORY: She is GX P3, first pregnancy to term age 80, last menstrual period when she was 67 years old.  She never took any hormones.     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  Their son, Brittany [name], works for [employer], an Electronics engineer, and is working for his [employer]; he has 2 children  and lives in [place], right outside of [place].  They have a daughter, Brittany [name], who lives in [place] and has 3 children, and a son, Brittany [name], who just married a young woman from [place] and is planning to move to [place] around Christmas time.  The patient attends the [employer].   ADVANCED DIRECTIVES:  HEALTH MAINTENANCE: History  Substance Use Topics  . Smoking status: Former Games developer  . Smokeless tobacco: Not on file  . Alcohol Use: Yes     Comment: 1 beer     Colonoscopy:  PAP:  Bone density:  Lipid panel:  Allergies  Allergen Reactions  . Ibandronate Sodium     REACTION: intolerance    Current Outpatient Prescriptions  Medication Sig Dispense Refill  . ASPIRIN PO Take 81 mg by mouth daily.       . Cholecalciferol (VITAMIN D-3 PO) Take by mouth.        . citalopram (CELEXA) 40 MG tablet Take 1 tablet (40 mg total) by mouth daily.  90 tablet  1  . Cyanocobalamin (VITAMIN B-12 CR PO) Take by mouth.      . Omega-3 Fatty Acids (FISH OIL) 1000 MG CAPS Take 1 capsule by mouth daily.        . ranitidine (ZANTAC) 150 MG tablet Take 1 tablet (150 mg total) by mouth 2 (two) times daily.  60 tablet  11  . SUMAtriptan (IMITREX) 100 MG tablet Take 1 tablet (100 mg total) by mouth daily as needed for migraine (headache).  9 tablet  6  . citalopram (CELEXA) 40 MG tablet TAKE 1 TABLET BY MOUTH DAILY. NEEDS OFFICE VISIT.  90 tablet  0    OBJECTIVE: Middle-aged white woman who appears well Filed Vitals:   03/21/12 1112  BP: 113/72  Pulse: 74  Temp: 97.8 F (36.6 C)  Resp: 20     Body mass index is 21.30 kg/(m^2).    ECOG FS: 0 Filed Weights   03/21/12 1112  Weight: 136 lb (61.689 kg)    Sclerae unicteric Oropharynx clear No cervical or supraclavicular adenopathy Lungs no rales or rhonchi Heart regular rate and rhythm Abdomen is soft, nontender to palpation with positive bowel sounds MSK no focal spinal tenderness, no peripheral edema Neuro: nonfocal,  well oriented with positive affect Breasts: The right breast is status post lumpectomy and radiation. There is no evidence of local recurrence. The right axilla shows no suspicious masses. The left breast is unremarkable.  Breast tissue is dense to palpation bilaterally.  LAB RESULTS: Lab Results  Component Value Date   WBC 5.7 03/21/2012   NEUTROABS 3.6 03/21/2012   HGB 13.8 03/21/2012   HCT 40.5 03/21/2012   MCV 94.0 03/21/2012   PLT 254 03/21/2012      Chemistry      Component Value Date/Time   NA 134* 03/21/2012 1053   NA 139 10/05/2011 1317   K 3.9 03/21/2012 1053   K 3.7 10/05/2011 1317   CL 98 03/21/2012 1053  CL 102 10/05/2011 1317   CO2 28 03/21/2012 1053   CO2 27 10/05/2011 1317   BUN 11.0 03/21/2012 1053   BUN 8 10/05/2011 1317   CREATININE 0.7 03/21/2012 1053   CREATININE 0.65 10/05/2011 1317      Component Value Date/Time   CALCIUM 9.8 03/21/2012 1053   CALCIUM 9.7 10/05/2011 1317   ALKPHOS 45 03/21/2012 1129   AST 16 03/21/2012 1129   ALT 11 03/21/2012 1129   BILITOT 0.4 03/21/2012 1129       Lab Results  Component Value Date   LABCA2 28 03/21/2012     STUDIES: Bone density 05/2011 showed osteoporosis.   Mammogram at [facility] March 2013 showed extremely dense breasts, no evidence of disease.    ASSESSMENT: 67 y.o. 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  (2) status post  neoadjuvant chemotherapy, which consisted of docetaxel/gemcitabine/bevacizumab followed by doxorubicin/cyclophosphamide/bevacizumab as per the NSABP B-40 protocol.  Last bevacizumab dose was January of 2010.   (3) status-post right lumpectomy and sentinel lymph node dissection in April of 2010 showing a complete pathologic response   (4) She completed radiation July of 2010.   (5)  osteoporosis being treated with annual zoledronic acid, first dose given September 2013 with good  tolerance.  PLAN:  Keylie continues to do well with regards to her breast cancer. We worry about her very dense breasts, but are not sure exactly how to followup on this. We could consider yearly MRIs, or MRIs alternating with BSGI. She is due for her next mammogram in April and we'll see Dr. Darnelle Catalan for labs and followup exam in July. Our plan is to continue zoledronic acid annually, and she'll receive her second dose in September 2014. She'll then be due for a repeat bone density following April and we will assess whether or not to proceed with additional zoledronic acid at that time. Our hope is that she will only need to annual doses.  Amelita voices understanding and agreement with this plan, and will call with any changes or problems.   Marjean Imperato    03/21/2012

## 2012-03-25 ENCOUNTER — Ambulatory Visit: Payer: BC Managed Care – PPO | Admitting: Radiation Oncology

## 2012-03-29 ENCOUNTER — Ambulatory Visit (INDEPENDENT_AMBULATORY_CARE_PROVIDER_SITE_OTHER): Payer: BC Managed Care – PPO | Admitting: Internal Medicine

## 2012-03-29 ENCOUNTER — Encounter: Payer: Self-pay | Admitting: Internal Medicine

## 2012-03-29 VITALS — BP 122/78 | HR 83 | Temp 99.7°F | Ht 67.0 in | Wt 135.0 lb

## 2012-03-29 DIAGNOSIS — R059 Cough, unspecified: Secondary | ICD-10-CM

## 2012-03-29 DIAGNOSIS — R05 Cough: Secondary | ICD-10-CM

## 2012-03-29 DIAGNOSIS — J069 Acute upper respiratory infection, unspecified: Secondary | ICD-10-CM

## 2012-03-29 MED ORDER — AZITHROMYCIN 250 MG PO TABS: ORAL_TABLET | ORAL | Status: DC

## 2012-03-29 MED ORDER — HYDROCODONE-HOMATROPINE 5-1.5 MG/5ML PO SYRP: 5.0000 mL | ORAL_SOLUTION | Freq: Three times a day (TID) | ORAL | Status: DC | PRN

## 2012-03-29 NOTE — Patient Instructions (Signed)

## 2012-03-30 ENCOUNTER — Encounter: Payer: Self-pay | Admitting: Internal Medicine

## 2012-03-30 NOTE — Progress Notes (Signed)
HPI  Pt presents to the clinic today with c/o cold symptoms x 2 weeks. The worst part is the sore throat and dry cough. She does not produce any sputum. She was running fevers last week but none this week. She has tried Robitussin, Mucinex, cough drops and nothing seems to help. The cough is worse at night. She has not had much sleep in 3 nights. She does have sick contacts.  Review of Systems      Past Medical History  Diagnosis Date  . Cancer     breast right    Family History  Problem Relation Age of Onset  . Heart disease Father   . Cancer Sister     Breast & Colorectal    History   Social History  . Marital Status: Married    Spouse Name: N/A    Number of Children: N/A  . Years of Education: N/A   Occupational History  . Not on file.   Social History Main Topics  . Smoking status: Former Games developer  . Smokeless tobacco: Not on file  . Alcohol Use: Yes     Comment: 1 beer  . Drug Use: No  . Sexually Active: Not on file   Other Topics Concern  . Not on file   Social History Narrative  . No narrative on file    Allergies  Allergen Reactions  . Ibandronate Sodium     REACTION: intolerance     Constitutional: Positive headache, fatigue and fever. Denies abrupt weight changes.  HEENT:  Positive sore throat. Denies eye redness, eye pain, pressure behind the eyes, facial pain, nasal congestion, ear pain, ringing in the ears, wax buildup, runny nose or bloody nose. Respiratory: Positive cough. Denies difficulty breathing or shortness of breath.  Cardiovascular: Denies chest pain, chest tightness, palpitations or swelling in the hands or feet.   No other specific complaints in a complete review of systems (except as listed in HPI above).  Objective:   BP 122/78  Pulse 83  Temp 99.7 F (37.6 C) (Oral)  Ht 5\' 7"  (1.702 m)  Wt 135 lb (61.236 kg)  BMI 21.14 kg/m2  SpO2 98% Wt Readings from Last 3 Encounters:  03/29/12 135 lb (61.236 kg)  03/21/12 136 lb  (61.689 kg)  02/02/12 137 lb (62.143 kg)     General: Appears her stated age, well developed, well nourished in NAD. HEENT: Head: normal shape and size; Eyes: sclera white, no icterus, conjunctiva pink, PERRLA and EOMs intact; Ears: Tm's gray and intact, normal light reflex; Nose: mucosa pink and moist, septum midline; Throat/Mouth: + PND. Teeth present, mucosa erythematous and moist, no exudate noted, no lesions or ulcerations noted.  Neck: Mild cervical lymphadenopathy. Neck supple, trachea midline. No massses, lumps or thyromegaly present.  Cardiovascular: Normal rate and rhythm. S1,S2 noted.  No murmur, rubs or gallops noted. No JVD or BLE edema. No carotid bruits noted. Pulmonary/Chest: Normal effort and positive vesicular breath sounds. No respiratory distress. No wheezes, rales or ronchi noted.      Assessment & Plan:   Upper Respiratory Infection, new onset with additional workup required:  Get some rest and drink plenty of water Do salt water gargles for the sore throat eRx for Azithromax x 5 days eRx for Hycodan cough syrup  RTC as needed or if symptoms persist.

## 2012-04-02 ENCOUNTER — Encounter: Payer: Self-pay | Admitting: Internal Medicine

## 2012-04-02 ENCOUNTER — Ambulatory Visit (INDEPENDENT_AMBULATORY_CARE_PROVIDER_SITE_OTHER): Payer: BC Managed Care – PPO | Admitting: Internal Medicine

## 2012-04-02 VITALS — BP 118/76 | HR 86 | Temp 98.1°F | Wt 134.0 lb

## 2012-04-02 DIAGNOSIS — R05 Cough: Secondary | ICD-10-CM

## 2012-04-02 DIAGNOSIS — J309 Allergic rhinitis, unspecified: Secondary | ICD-10-CM

## 2012-04-02 DIAGNOSIS — J302 Other seasonal allergic rhinitis: Secondary | ICD-10-CM

## 2012-04-02 MED ORDER — MONTELUKAST SODIUM 10 MG PO TABS: 10.0000 mg | ORAL_TABLET | Freq: Every day | ORAL | Status: DC

## 2012-04-02 MED ORDER — PREDNISONE 20 MG PO TABS: 20.0000 mg | ORAL_TABLET | Freq: Two times a day (BID) | ORAL | Status: DC

## 2012-04-02 NOTE — Progress Notes (Signed)
  Subjective:    Patient ID: Brittany [name], female    DOB: 1945/05/13, 67 y.o.   MRN: [id number]  HPI Cough initially appeared 1 week ago  ;cough is described as dry w/o  sputum or associated hemoptysis There was no specific trigger such as respiratory tract infection; environmental  ; occupational ; or sick contact exposure. The cough has been stable, not progressive & not impacting rest at night    Treatment with Zpack;ProMeth and Hydrocodone prescription cough syrups has been only  partially effective.  No occupational/environmental exposures include mold,dust ,temperature extremes Patient smoked ages 86 to 26 up to < 1 ppd . No On ACE inhibitor administration: Past medical history/family history negative for  pulmonary disease except for seasonal allergies in her .  She has similar episode in September which resolved spontaneously.             Review of Systems Associated signs and symptoms do NOT include frontal headache, facial pain, nasal purulence, dental pain, sore throat,earache and otic discharge. Itchy, watery eyes, sneezing NOT noted Fever, chills, sweats were NOT present Pleuritic pain, dyspnea, and wheezing were NOT noted. Significant dyspepsia and reflux symptoms were NOT present .    Objective:   Physical Exam  General appearance:good health ;well nourished; no acute distress or increased work of breathing is present.  No  lymphadenopathy about the head, neck, or axilla noted.  Eyes: No conjunctival inflammation or lid edema is present.  Ears:  External ear exam shows no significant lesions or deformities.  Otoscopic examination reveals clear canals, tympanic membranes are intact bilaterally without bulging, retraction, inflammation or discharge. Nose:  External nasal examination shows no deformity or inflammation. Nasal mucosa are pink and moist without lesions or exudates. No septal dislocation or deviation.No obstruction to airflow.  Oral exam: Dental hygiene  is good; lips and gums are healthy appearing.There is no oropharyngeal erythema or exudate noted.  Neck:  No deformities, thyromegaly, masses, or tenderness noted.   Heart:  Normal rate and regular rhythm. S1 and S2 normal without gallop, murmur, click, rub or other extra sounds. S4  Lungs:Chest clear to auscultation; no wheezes, rhonchi,rales ,or rubs present.No increased work of breathing.  Paroxysmal dry cough Extremities:  No cyanosis, edema, or clubbing  noted  Skin: Warm & dry         Assessment & Plan:  #1 nonproductive cough with no clinical history or findings to suggest rhinosinusitis or purulent bronchitis.  Plan: See orders and recommendations

## 2012-04-02 NOTE — Patient Instructions (Addendum)
Plain Mucinex (NOT D) for thick secretions ;force NON dairy fluids .   Nasal cleansing in the shower as discussed with lather of mild shampoo.After 10 seconds wash off lather while  exhaling through nostrils. Make sure that all residual soap is removed to prevent irritation.  Use a Neti pot daily only  as needed for significant sinus congestion; going from open side to congested side . Plain Allegra (NOT D )  160 daily , Loratidine 10 mg , OR Zyrtec 10 mg @ bedtime  as needed for itchy eyes & sneezing.  Consider Allergy evaluation with Dr Fannie Knee if symptoms persist

## 2012-04-04 ENCOUNTER — Telehealth: Payer: Self-pay | Admitting: Internal Medicine

## 2012-04-04 NOTE — Telephone Encounter (Signed)
[facility] Triage Call Report Triage Record Num: [id number] Operator: Levora Angel Patient Name: Brittany [name] Call Date & Time: 04/01/2012 5:20:42PM Patient Phone: [phone] PCP: [name] Patient Gender: Female PCP Fax : [phone] Patient DOB: 06/27/1945 Practice Name: [facility] Reason for Call: Caller: Vannie/Patient; PCP: [name] (Adults only); CB#: [phone]; Call regarding Cough/Congestion; Onset: 03/26/12 started with cold symptoms was seen by Brittany [name] in office on 03/29/12 was placed on z-pak and cough medicine. Patient is down to one pill and still has significant cough. Minimal improvement noted with treatment however symptoms have not worsened. Urgent symptom of "Recurrent episodes of uncontrolled coughing interfering with ability to carry out usual activities or with normal sleep patterns" per Cough - Adult protocol. Home care advice given and appointment scheduled 04/02/12 at 10:00 with Dr. Alwyn Ren. Provided home care advice of a Humidifier, and honey for cough. Protocol(s) Used: Cough - Adult Protocol(s) Used: Upper Respiratory Infection (URI) Recommended Outcome per Protocol: See Provider within 24 hours Reason for Outcome: Cough is the symptom causing most concern Recurrent episodes of uncontrolled coughing interfering with ability to carry out usual activities or with normal sleep patterns Care Advice: ~ Call provider immediately to report symptoms, if you have not been previously diagnosed for this problem.

## 2012-04-04 NOTE — Telephone Encounter (Signed)
Patient was seen at Saturday clinic 

## 2012-04-05 ENCOUNTER — Telehealth: Payer: Self-pay | Admitting: Internal Medicine

## 2012-04-05 NOTE — Telephone Encounter (Signed)
Per CY-get in ASAP. Pt can come in on Wednesday 04-06-12 at 11:15am or Friday 04-15-12 at 11:00am for allergy consult with CY.

## 2012-04-05 NOTE — Telephone Encounter (Signed)
Called, spoke with pt.  Pt states she's had a "very bad chest cold and really bad cough" x couple of weeks.   Report cough is nonprod.  Denies increased SOB, wheezing, chest tightness, body aches, or f/c/s.  Was seen by NP at PCP, Dr. Loren Racer,  office then by Dr. Alwyn Ren who recs she see Dr. Maple Hudson for an Allergy evaluation if symptoms persist.  Requesting appt with Dr. Maple Hudson -- has not seen CDY in office setting.  Dr. Maple Hudson or Katie, pls advise when pt can be seen.  Thank you.

## 2012-04-05 NOTE — Telephone Encounter (Signed)
Called spoke with patient, advised of CY's recs as stated below.  Pt requested to be worked in tomorrow @ 1115.  Pt aware to arrive early for paperwork.  Ov scheduled; will sign off.

## 2012-04-06 ENCOUNTER — Encounter: Payer: Self-pay | Admitting: Internal Medicine

## 2012-04-06 ENCOUNTER — Ambulatory Visit (INDEPENDENT_AMBULATORY_CARE_PROVIDER_SITE_OTHER): Payer: BC Managed Care – PPO | Admitting: Internal Medicine

## 2012-04-06 ENCOUNTER — Other Ambulatory Visit (INDEPENDENT_AMBULATORY_CARE_PROVIDER_SITE_OTHER): Payer: BC Managed Care – PPO

## 2012-04-06 VITALS — BP 118/68 | HR 67 | Ht 67.0 in | Wt 135.6 lb

## 2012-04-06 DIAGNOSIS — J3089 Other allergic rhinitis: Secondary | ICD-10-CM

## 2012-04-06 DIAGNOSIS — J45909 Unspecified asthma, uncomplicated: Secondary | ICD-10-CM

## 2012-04-06 DIAGNOSIS — C801 Malignant (primary) neoplasm, unspecified: Secondary | ICD-10-CM | POA: Insufficient documentation

## 2012-04-06 DIAGNOSIS — J309 Allergic rhinitis, unspecified: Secondary | ICD-10-CM

## 2012-04-06 DIAGNOSIS — R05 Cough: Secondary | ICD-10-CM

## 2012-04-06 LAB — CBC WITH DIFFERENTIAL/PLATELET
Basophils Relative: 0.6 % (ref 0.0–3.0)
Eosinophils Relative: 4.8 % (ref 0.0–5.0)
Hemoglobin: 13.5 g/dL (ref 12.0–15.0)
Lymphocytes Relative: 23.8 % (ref 12.0–46.0)
Monocytes Relative: 6.6 % (ref 3.0–12.0)
Neutro Abs: 4.4 10*3/uL (ref 1.4–7.7)
Neutrophils Relative %: 64.2 % (ref 43.0–77.0)
RBC: 4.28 Mil/uL (ref 3.87–5.11)
WBC: 6.9 10*3/uL (ref 4.5–10.5)

## 2012-04-06 MED ORDER — BENZONATATE 200 MG PO CAPS: 200.0000 mg | ORAL_CAPSULE | Freq: Three times a day (TID) | ORAL | Status: DC | PRN

## 2012-04-06 MED ORDER — MOMETASONE FURO-FORMOTEROL FUM 100-5 MCG/ACT IN AERO: 2.0000 | INHALATION_SPRAY | Freq: Two times a day (BID) | RESPIRATORY_TRACT | Status: DC

## 2012-04-06 MED ORDER — METHYLPREDNISOLONE ACETATE 80 MG/ML IJ SUSP
80.0000 mg | Freq: Once | INTRAMUSCULAR | Status: AC
  Administered 2012-04-06: 80 mg via INTRAMUSCULAR

## 2012-04-06 NOTE — Assessment & Plan Note (Signed)
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 Don't get hx of reflux or sinus disease/ post-nasal drip. She has no recollection of benzonatate last fall, when she had a similar protracted syndrome. There may be some airway changes from XRT and her remote smoking. Don't see indication for antibiotics yet. Plan- depomedrol, sample Dulera with discussions. Antihistamine prn. She is to call if no better in a week. If this drags out will want to update CXR, consider PFT.

## 2012-04-06 NOTE — Patient Instructions (Addendum)
Script sent for benzonatate for cough  Depo 80  Order Lab- Allergy profile, CBC        Dx allergic bronchitis  Sample Dulera 100 inhaler   2 puffs then rinse mouth, twice daily

## 2012-04-06 NOTE — Assessment & Plan Note (Signed)
Not a prime allergen season now but will check IgE profile to get a sense of predisposition given her hx of seasonal symptoms. She denies drowsiness with benadryl.. Plan Allergy Profile

## 2012-04-06 NOTE — Progress Notes (Signed)
  Subjective:    Patient ID: Brittany [name], female    DOB: 1945/10/08, 67 y.o.   MRN: [id number]  HPI 39 yoF former smoker Ref by Dr. Radene Gunning pt "very bad" cough x2wks occuring at all times of the day--nonprod, denies any fever --has tried zpack, hydrocodone cough syrup w little improvement. Denies fever, head congestion, dyspnea, wheeze. Mostly dry cough or scant clear mucus. No definite trigger cold or exposure, no aspiration event. No help from Z pak. Doesn't reember taking tessalon- script in computer from Oct, 2013. At that time was treated with Advair for persistent cough. She is not specific with her hx but I reviewed that note in EMR. Similar episode in Sept, 2012, resolved spontaneously. Some hx of seasonal allergic rhinitis- prefers benadryl. Medical hx R breast Ca 2010- lumpectomy, chemo, XRT CXR 12/01/11 CHEST - 2 VIEW  Comparison: Chest radiograph June 22, 2008; chest CT December 17, 2008  Findings: There is localized pleural thickening in the right upper  lobe, stable. Elsewhere, the lungs are clear. The heart size and  pulmonary vascularity are normal. No adenopathy. No bone lesions.  IMPRESSION: No edema or consolidation.  Original Report Authenticated By: Arvin Collard. WOODRUFF III, M.D.   Review of Systems  Constitutional: Negative for fever and unexpected weight change.  HENT: Negative for ear pain, nosebleeds, congestion, sore throat, rhinorrhea, sneezing, trouble swallowing, dental problem, postnasal drip and sinus pressure.   Eyes: Negative for redness and itching.  Respiratory: Positive for cough. Negative for chest tightness, shortness of breath and wheezing.   Cardiovascular: Negative for palpitations and leg swelling.  Gastrointestinal: Negative for nausea and vomiting.  Genitourinary: Negative for dysuria.  Musculoskeletal: Negative for joint swelling.  Skin: Negative for rash.  Neurological: Negative for headaches.  Hematological: Does not bruise/bleed  easily.  Psychiatric/Behavioral: Negative for dysphoric mood. The patient is not nervous/anxious.       Objective:   Physical Exam OBJ- Physical Exam General- Alert, Oriented, Affect-appropriate, Distress- none acute. Conversational Skin- rash-none, lesions- none, excoriation- none Lymphadenopathy- none Head- atraumatic            Eyes- Gross vision intact, PERRLA, conjunctivae and secretions clear            Ears- Hearing, canals-normal            Nose- Clear, no-Septal dev, mucus, polyps, erosion, perforation             Throat- Mallampati III , mucosa clear , drainage- none, tonsils- atrophic Neck- flexible , trachea midline, no stridor , thyroid nl, carotid no bruit Chest - symmetrical excursion , unlabored           Heart/CV- RRR , no murmur , no gallop  , no rub, nl s1 s2                           - JVD- none , edema- none, stasis changes- none, varices- none           Lung- clear to P&A, wheeze- none, +Raspy cough began toward end of interview, not during deep breath , dullness-none, rub- none           Chest Luther-  Abd- tender-no, distended-no, bowel sounds-present, HSM- no Br/ Gen/ Rectal- Not done, not indicated Extrem- cyanosis- none, clubbing, none, atrophy- none, strength- nl Neuro- grossly intact to observation         Assessment & Plan:

## 2012-04-07 LAB — ALLERGY FULL PROFILE
Allergen, D pternoyssinus,d7: 24 kU/L — ABNORMAL HIGH
Alternaria Alternata: <0.1 kU/L
Bahia Grass: 0.28 kU/L — ABNORMAL HIGH
Cat Dander: 12.2 kU/L — ABNORMAL HIGH
Common Ragweed: 1.17 kU/L — ABNORMAL HIGH
Dog Dander: 1.85 kU/L — ABNORMAL HIGH
Fescue: 0.27 kU/L — ABNORMAL HIGH
Goldenrod: 0.2 kU/L — ABNORMAL HIGH
Helminthosporium halodes: <0.1 kU/L
House Dust Hollister: 5.6 kU/L — ABNORMAL HIGH
Lamb's Quarters: 0.27 kU/L — ABNORMAL HIGH
Sycamore Tree: 0.23 kU/L — ABNORMAL HIGH

## 2012-04-08 NOTE — Progress Notes (Signed)
Quick Note:  LMTCB ______ 

## 2012-04-11 NOTE — Progress Notes (Signed)
Quick Note:  Spoke with patient, informed her of results as listed below per CY. Nothing further needed at this time. ______

## 2012-04-15 ENCOUNTER — Encounter: Payer: Self-pay | Admitting: *Deleted

## 2012-04-15 ENCOUNTER — Encounter: Payer: Self-pay | Admitting: Radiation Oncology

## 2012-04-15 ENCOUNTER — Ambulatory Visit
Admission: RE | Admit: 2012-04-15 | Discharge: 2012-04-15 | Disposition: A | Discharge status: Home / Self Care | Payer: BC Managed Care – PPO | Source: Ambulatory Visit | Attending: Radiation Oncology | Admitting: Radiation Oncology

## 2012-04-15 VITALS — BP 107/54 | HR 67 | Temp 98.4°F | Resp 20 | Wt 135.0 lb

## 2012-04-15 DIAGNOSIS — K449 Diaphragmatic hernia without obstruction or gangrene: Secondary | ICD-10-CM | POA: Insufficient documentation

## 2012-04-15 DIAGNOSIS — Z923 Personal history of irradiation: Secondary | ICD-10-CM | POA: Insufficient documentation

## 2012-04-15 DIAGNOSIS — C50919 Malignant neoplasm of unspecified site of unspecified female breast: Secondary | ICD-10-CM

## 2012-04-15 DIAGNOSIS — Z9221 Personal history of antineoplastic chemotherapy: Secondary | ICD-10-CM | POA: Insufficient documentation

## 2012-04-15 DIAGNOSIS — K2970 Gastritis, unspecified, without bleeding: Secondary | ICD-10-CM | POA: Insufficient documentation

## 2012-04-15 NOTE — Progress Notes (Signed)
Patient here follow up rad tx right breast  Last completed 09/11/2008, no c/o pain, doing well, last mammogram 05/13/11, has appt thias march at [facility] 2:16 PM

## 2012-04-17 NOTE — Progress Notes (Signed)
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  She is feeling well and doing well. She has no breast related complaints. She denies any headache new bone pain or abdominal swelling. She unfortunately continues to struggle with chronic cough and is seeing pulmonary for this. Is an appointment to see Dr. Darnelle Catalan in July. She is due for mammograms in March and had negative bilateral mammograms at [facility] in March of 2013.   Allergies:  Allergies  Allergen Reactions  . Ibandronate Sodium     REACTION: intolerance--BONIVA    Medications:  Current Outpatient Prescriptions  Medication Sig Dispense Refill  . ASPIRIN PO Take 81 mg by mouth daily.       [name] CALCIUM CARBONATE PO Take by mouth 2 (two) times daily. 2000mg       . Cholecalciferol (VITAMIN D-3 PO) Take 1 tablet by mouth daily.       . citalopram (CELEXA) 40 MG tablet Take 1 tablet (40 mg total) by mouth daily.  90 tablet  1  . Cyanocobalamin (VITAMIN B-12 CR PO) Take by mouth.      . Omega-3 Fatty Acids (FISH OIL) 1000 MG CAPS Take 1 capsule by mouth daily.        . ranitidine (ZANTAC) 150 MG tablet Take 1 tablet (150 mg total) by mouth 2 (two) times daily.  60 tablet  11  . SUMAtriptan (IMITREX) 100 MG tablet Take 1 tablet (100 mg total) by mouth daily as needed for migraine (headache).  9 tablet  6  . benzonatate (TESSALON) 200 MG capsule Take 1 capsule (200 mg total) by mouth 3 (three) times daily as needed for cough.  30 capsule  1  . HYDROcodone-homatropine (HYCODAN) 5-1.5 MG/5ML syrup Take 5 mLs by mouth every 8 (eight) hours as needed for cough.  120 mL  0  .  mometasone-formoterol (DULERA) 100-5 MCG/ACT AERO Inhale 2 puffs into the lungs 2 (two) times daily.  1 Inhaler  0  . montelukast (SINGULAIR) 10 MG tablet Take 1 tablet (10 mg total) by mouth at bedtime.  30 tablet  3   No current facility-administered medications for this encounter.    Physical Exam:  Filed Vitals:   04/15/12 1406  BP: 107/54  Pulse: 67  Temp: 98.4 F (36.9 C)  Resp: 20   she has small ptotic breasts bilaterally. Her right breast is slightly everted laterally. she has the same lumpectomy cavity scarring that she has had. No palpable abnormalities of the left breast. No palpable cervical or supraclavicular adenopathy bilaterally. No palpable axillary adenopathy.  IMPRESSION: Brittany [name] is a 67 y.o. female status post breast conservation and adjuvant chemotherapy for a right breast cancer 3-1/2 years out from treatment with no evidence of disease  PLAN:  Brittany [name] looks great. We again talked about releasing her from followup with me. She really would like to continue to be seen on every 6 month basis. We talked about the natural history of triple-negative disease. She is seeing medical oncology in July. He will see me again in a year. I will place orders for her mammogram.    Lurline Hare, MD

## 2012-05-09 ENCOUNTER — Other Ambulatory Visit: Payer: Self-pay | Admitting: *Deleted

## 2012-05-10 ENCOUNTER — Ambulatory Visit: Payer: BC Managed Care – PPO | Admitting: Internal Medicine

## 2012-06-12 ENCOUNTER — Other Ambulatory Visit: Payer: Self-pay | Admitting: Internal Medicine

## 2012-06-20 ENCOUNTER — Encounter: Payer: Self-pay | Admitting: Oncology

## 2012-08-02 ENCOUNTER — Encounter (INDEPENDENT_AMBULATORY_CARE_PROVIDER_SITE_OTHER): Payer: Self-pay | Admitting: Surgery

## 2012-08-02 ENCOUNTER — Ambulatory Visit (INDEPENDENT_AMBULATORY_CARE_PROVIDER_SITE_OTHER): Payer: MEDICARE | Admitting: Surgery

## 2012-08-02 VITALS — BP 110/64 | HR 68 | Temp 97.4°F | Resp 14 | Ht 66.5 in | Wt 132.2 lb

## 2012-08-02 DIAGNOSIS — Z853 Personal history of malignant neoplasm of breast: Secondary | ICD-10-CM

## 2012-08-02 NOTE — Patient Instructions (Signed)
Continue annual mammograms  

## 2012-08-02 NOTE — Progress Notes (Signed)
08/02/2012  Brittany [name] is a 67 y.o.female who presents for routine followup of her Right breast cancer, IDC, diagnosed in September 2009 and treated with neoadjuvant chemotherapy, lumpectomy, and radiation therapy. She has no problems or concerns on either side.  PFSH She has had no significant changes since the last visit here.  ROS There have been no significant changes since the last visit here  General: The patient is alert, oriented, generally healty appearing, NAD. Mood and affect are normal.  Breasts : The right breast shows post-op deformity and is somewhat smaller than the left. There is no evidence of recurrence or new tumor. The left breast is normal Lymphatics: She has no axillary or supraclavicular adenopathy on either side.  Extremities: Full ROM of the surgical side with no lymphedema noted.  Data Reviewed: 3-D Mammogram in March, 2014 negative  Impression: No evidence of recurrence  Plan: RTC one year, sooner PRN

## 2012-09-06 ENCOUNTER — Other Ambulatory Visit: Payer: Self-pay | Admitting: *Deleted

## 2012-09-06 MED ORDER — RANITIDINE HCL 150 MG PO TABS: 150.0000 mg | ORAL_TABLET | Freq: Two times a day (BID) | ORAL | Status: DC

## 2012-09-06 MED ORDER — CITALOPRAM HYDROBROMIDE 40 MG PO TABS: 40.0000 mg | ORAL_TABLET | Freq: Every day | ORAL | Status: DC

## 2012-09-12 ENCOUNTER — Other Ambulatory Visit (HOSPITAL_BASED_OUTPATIENT_CLINIC_OR_DEPARTMENT_OTHER): Payer: BC Managed Care – PPO

## 2012-09-12 DIAGNOSIS — M810 Age-related osteoporosis without current pathological fracture: Secondary | ICD-10-CM

## 2012-09-12 DIAGNOSIS — Z853 Personal history of malignant neoplasm of breast: Secondary | ICD-10-CM

## 2012-09-12 DIAGNOSIS — C50919 Malignant neoplasm of unspecified site of unspecified female breast: Secondary | ICD-10-CM

## 2012-09-12 LAB — CBC WITH DIFFERENTIAL/PLATELET
Basophils Absolute: 0 10*3/uL (ref 0.0–0.1)
EOS%: 3.7 % (ref 0.0–7.0)
Eosinophils Absolute: 0.2 10*3/uL (ref 0.0–0.5)
HCT: 38.4 % (ref 34.8–46.6)
HGB: 13.1 g/dL (ref 11.6–15.9)
MCH: 32.1 pg (ref 25.1–34.0)
MCV: 93.8 fL (ref 79.5–101.0)
MONO%: 8.9 % (ref 0.0–14.0)
NEUT#: 2.6 10*3/uL (ref 1.5–6.5)
NEUT%: 57.8 % (ref 38.4–76.8)
Platelets: 266 10*3/uL (ref 145–400)
RDW: 13.2 % (ref 11.2–14.5)

## 2012-09-12 LAB — COMPREHENSIVE METABOLIC PANEL (CC13)
Albumin: 3.8 g/dL (ref 3.5–5.0)
Alkaline Phosphatase: 62 U/L (ref 40–150)
BUN: 7.8 mg/dL (ref 7.0–26.0)
Calcium: 9.3 mg/dL (ref 8.4–10.4)
Creatinine: 0.7 mg/dL (ref 0.6–1.1)
Glucose: 95 mg/dl (ref 70–140)
Potassium: 4.1 mEq/L (ref 3.5–5.1)

## 2012-09-13 LAB — VITAMIN D 25 HYDROXY (VIT D DEFICIENCY, FRACTURES): Vit D, 25-Hydroxy: 67 ng/mL (ref 30–89)

## 2012-09-19 ENCOUNTER — Telehealth: Payer: Self-pay | Admitting: *Deleted

## 2012-09-19 ENCOUNTER — Ambulatory Visit (HOSPITAL_BASED_OUTPATIENT_CLINIC_OR_DEPARTMENT_OTHER): Payer: BC Managed Care – PPO | Admitting: Oncology

## 2012-09-19 VITALS — BP 107/69 | HR 66 | Temp 98.4°F | Resp 20 | Ht 66.5 in | Wt 131.6 lb

## 2012-09-19 DIAGNOSIS — Z853 Personal history of malignant neoplasm of breast: Secondary | ICD-10-CM

## 2012-09-19 NOTE — Telephone Encounter (Signed)
appts made and printed. gv appt for [facility] for 05/15/13 @ 2pm...td

## 2012-09-19 NOTE — Telephone Encounter (Signed)
Per scheduler I have scheduled appt for October.  JMW

## 2012-09-19 NOTE — Progress Notes (Signed)
ID: Brittany [name]   DOB: 11/18/1945  MR#: [id number]  JXB#:[id number]  HISTORY OF PRESENT ILLNESS: The patient noted a mass in her right breast and immediately brought it to Dr. Cherlyn [name] attention 11/22/2007.  I should note that the patient had a negative routine screening mammogram at [facility] on 07/19/2007.  In the 11/22/2007 study, there was at least a 1.5-cm ill-defined density seen primarily on the right MLO view.  There were a few associated microcalcifications.  By palpation, Dr. Yolanda [name] felt this was approximately 1.9 cm.  By ultrasound, this was an irregular hypoechoic mass measuring up to 1.9 cm.  There were a few irregular vessels present by Doppler.  There were no abnormal lymph nodes noted.  Breast specific gamma imaging was performed the next day, 11/23/2007, and showed a normal left breast.  On the right, there was a 1.6-cm high-density focus of abnormal isotope activity.  Biopsy of the mass was performed the same day under ultrasound guidance, and showed ([id number]) an intermediate to high-grade invasive ductal carcinoma, which was ER and PR negative with a proliferation marker of 50%.  HER2-neu was negative at 1+. Her subsequent history is as detailed below.  INTERVAL HISTORY: Brittany [name] returns today for followup of her right breast cancer. The interval history is unremarkable. She is exercising regularly, and planning to take a trip to [place] to visit her son leaving two days from now.  REVIEW OF SYSTEMS: Brittany [name] denies unusual headaches, visual changes, cough, phlegm production, pleurisy, chest pain or pressure, bleeding, rash, unexplained weight loss, unexplained fatigue, or other symptoms. In fact her detailed review of systems today was entirely negative   PAST MEDICAL HISTORY: Past Medical History  Diagnosis Date  . Cancer     breast right  triple recet neg, lumpect, chemo, XRT  . Breast cancer 11/23/2007    right, ER/PR -, HER 2 -  . History of radiation  therapy 07/25/08 -09/11/08    right breast  . Osteopenia   . Esophageal stricture   . Gastritis 10/2007  . Migraines     rare  . Hiatal hernia   . Benign skin growth age 51    back of right calf, excised  . History of chemotherapy     neoadjuvant chemo, last dose 03/2008  Significant for osteopenia, history of hiatal hernia with esophageal stricture, status post upper endoscopy under Brittany [name] 10/10/2007 showing an erosive gastritis in a background of reactive gastropathy ([id number]).  She has a history of rare migraines, and when she was 17, she had a benign growth removed from the back of her right calf.    PAST SURGICAL HISTORY: Past Surgical History  Procedure Laterality Date  . Breast lumpectomy  06/26/2008    right, CA   XRT, chemo    FAMILY HISTORY Family History  Problem Relation Age of Onset  . Heart disease Father   . Cancer Father     lung  . Cancer Sister 62    Breast & Colorectal  . Epilepsy Mother   The patient's father died from lung cancer at the age of 60; he was not a smoker.  The patient's mother is alive at age 60; she has epilepsy.  The patient has a sister diagnosed with breast cancer when she was 64.  She is doing well.  The patient has a brother in good health.  There is no other history of breast or ovarian cancer in the family.  GYNECOLOGIC HISTORY: She is GX  P3, first pregnancy to term age 81, last menstrual period when she was 67 years old.  She never took any hormones.     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  Their son, Brittany [name], works for [employer], an Electronics engineer, and is working for his [employer]; he has 2 children and lives in [place], right outside of [place].  They have a daughter, Brittany [name], who lives in [place] and has 3 children, and a son, Brittany [name], who just married a young woman from [place] and is  planning to move to [place] around Christmas time.  The patient attends the [name].   ADVANCED DIRECTIVES:  HEALTH MAINTENANCE: History  Substance Use Topics  . Smoking status: Former Smoker -- 5 years    Types: Cigarettes    Quit date: 03/03/1967  . Smokeless tobacco: Never Used     Comment: 6 cig per day when she was smoking/quit years ago  . Alcohol Use: Yes     Comment: 1 beer     Colonoscopy:  PAP:  Bone density:  Lipid panel:  Allergies  Allergen Reactions  . Ibandronate Sodium     REACTION: intolerance--BONIVA    Current Outpatient Prescriptions  Medication Sig Dispense Refill  . ASPIRIN PO Take 81 mg by mouth daily.       . benzonatate (TESSALON) 200 MG capsule Take 1 capsule (200 mg total) by mouth 3 (three) times daily as needed for cough.  30 capsule  1  . CALCIUM CARBONATE PO Take by mouth 2 (two) times daily. 2000mg       . Cholecalciferol (VITAMIN D-3 PO) Take 1 tablet by mouth daily.       . citalopram (CELEXA) 40 MG tablet Take 1 tablet (40 mg total) by mouth daily.  90 tablet  2  . Cyanocobalamin (VITAMIN B-12 CR PO) Take by mouth.      [name] HYDROcodone-homatropine (HYCODAN) 5-1.5 MG/5ML syrup Take 5 mLs by mouth every 8 (eight) hours as needed for cough.  120 mL  0  . mometasone-formoterol (DULERA) 100-5 MCG/ACT AERO Inhale 2 puffs into the lungs 2 (two) times daily.  1 Inhaler  0  . montelukast (SINGULAIR) 10 MG tablet Take 1 tablet (10 mg total) by mouth at bedtime.  30 tablet  3  . Omega-3 Fatty Acids (FISH OIL) 1000 MG CAPS Take 1 capsule by mouth daily.        . ranitidine (ZANTAC) 150 MG tablet Take 1 tablet (150 mg total) by mouth 2 (two) times daily.  180 tablet  2  . SUMAtriptan (IMITREX) 100 MG tablet Take 1 tablet (100 mg total) by mouth daily as needed for migraine (headache).  9 tablet  6   No current facility-administered medications for this visit.    OBJECTIVE: Middle-aged white woman in no acute distress Filed  Vitals:   09/19/12 1334  BP: 107/69  Pulse: 66  Temp: 98.4 F (36.9 C)  Resp: 20     Body mass index is 20.92 kg/(m^2).    ECOG FS: 0 Filed Weights   09/19/12 1334  Weight: 131 lb 9.6 oz (59.693 kg)    Sclerae unicteric Oropharynx clear No cervical or supraclavicular adenopathy Lungs no rales or rhonchi Heart regular rate and rhythm Abdomen is soft, nontender, positive bowel sounds MSK no focal spinal tenderness, no peripheral edema Neuro: nonfocal,  well oriented,  positive affect Breasts: The right breast is status post lumpectomy and radiation. There is no evidence of local recurrence. The right axilla is benign. The left breast is unremarkable.   LAB RESULTS: Lab Results  Component Value Date   WBC 4.5 09/12/2012   NEUTROABS 2.6 09/12/2012   HGB 13.1 09/12/2012   HCT 38.4 09/12/2012   MCV 93.8 09/12/2012   PLT 266 09/12/2012      Chemistry      Component Value Date/Time   NA 136 09/12/2012 1117   NA 139 10/05/2011 1317   K 4.1 09/12/2012 1117   K 3.7 10/05/2011 1317   CL 98 03/21/2012 1053   CL 102 10/05/2011 1317   CO2 25 09/12/2012 1117   CO2 27 10/05/2011 1317   BUN 7.8 09/12/2012 1117   BUN 8 10/05/2011 1317   CREATININE 0.7 09/12/2012 1117   CREATININE 0.65 10/05/2011 1317      Component Value Date/Time   CALCIUM 9.3 09/12/2012 1117   CALCIUM 9.7 10/05/2011 1317   ALKPHOS 62 09/12/2012 1117   ALKPHOS 45 03/21/2012 1129   AST 16 09/12/2012 1117   AST 16 03/21/2012 1129   ALT 12 09/12/2012 1117   ALT 11 03/21/2012 1129   BILITOT 0.48 09/12/2012 1117   BILITOT 0.4 03/21/2012 1129       Lab Results  Component Value Date   LABCA2 28 03/21/2012     STUDIES: Bone density 05/2011 showed osteoporosis.   Mammogram at [facility] March 2014 shows grade 3 breast density, but no evidence of new or recurrent disease   ASSESSMENT: 66 y.o. [place] woman   (1) s/p right breast bopsy 11/2007 for a clinical  T1c N0, stage IA Invasive ductal carcinoma, grade 2-3, triple-negative with an  MIB-1 of 50%.  (2) status post  neoadjuvant chemotherapy, which consisted of docetaxel/ gemcitabine/ bevacizumab followed by doxorubicin/ cyclophosphamide/ bevacizumab as per the NSABP B-40 protocol.  Last bevacizumab dose was January of 2010.   (3) status-post right lumpectomy and sentinel lymph node dissection in April of 2010 showing a complete pathologic response   (4) She completed radiation July of 2010.   (5)  osteoporosis being treated with annual zoledronic acid, first dose given Octoberr 2013 with good tolerance.  PLAN:  Marrian is doing fine from a breast cancer point of view. She worries about her diagnosis, overall "this has been a small bump in the road and a blessing". I would not be uncomfortable seeing her once a year but she prefers to be seen every 6 months, so she will see or physician's assistant in January and she will see me again in July. She will have mammography and a repeat bone density March of 2015. She will have her second dose of zoledronic acid this October.   We did discuss the dense breast issue and she already had her first tomography together with mammography this year. I think that is a helpful development. She knows to call for any problems that may develop before the next visit here.   Diamon Reddinger C    09/19/2012

## 2012-10-05 ENCOUNTER — Other Ambulatory Visit: Payer: Self-pay

## 2012-10-10 ENCOUNTER — Other Ambulatory Visit: Payer: Self-pay | Admitting: *Deleted

## 2012-11-02 ENCOUNTER — Telehealth: Payer: Self-pay | Admitting: Oncology

## 2012-11-02 NOTE — Telephone Encounter (Signed)
, °

## 2012-11-07 ENCOUNTER — Other Ambulatory Visit (HOSPITAL_BASED_OUTPATIENT_CLINIC_OR_DEPARTMENT_OTHER): Payer: BC Managed Care – PPO

## 2012-11-07 DIAGNOSIS — Z853 Personal history of malignant neoplasm of breast: Secondary | ICD-10-CM

## 2012-11-07 DIAGNOSIS — C50919 Malignant neoplasm of unspecified site of unspecified female breast: Secondary | ICD-10-CM

## 2012-11-07 LAB — CBC WITH DIFFERENTIAL/PLATELET
BASO%: 1.1 % (ref 0.0–2.0)
EOS%: 6.3 % (ref 0.0–7.0)
LYMPH%: 26 % (ref 14.0–49.7)
MCH: 32.2 pg (ref 25.1–34.0)
MCHC: 34.2 g/dL (ref 31.5–36.0)
MCV: 94 fL (ref 79.5–101.0)
MONO#: 0.4 10*3/uL (ref 0.1–0.9)
MONO%: 8.8 % (ref 0.0–14.0)
Platelets: 276 10*3/uL (ref 145–400)
RBC: 4.31 10*6/uL (ref 3.70–5.45)
WBC: 5 10*3/uL (ref 3.9–10.3)

## 2012-11-07 LAB — COMPREHENSIVE METABOLIC PANEL (CC13)
ALT: 15 U/L (ref 0–55)
AST: 19 U/L (ref 5–34)
Albumin: 3.9 g/dL (ref 3.5–5.0)
Alkaline Phosphatase: 58 U/L (ref 40–150)
BUN: 10 mg/dL (ref 7.0–26.0)
CO2: 28 mEq/L (ref 22–29)
Calcium: 9.2 mg/dL (ref 8.4–10.4)
Chloride: 100 mEq/L (ref 98–109)
Creatinine: 0.7 mg/dL (ref 0.6–1.1)
Glucose: 98 mg/dl (ref 70–140)
Potassium: 4.3 mEq/L (ref 3.5–5.1)
Sodium: 136 mEq/L (ref 136–145)
Total Bilirubin: 0.56 mg/dL (ref 0.20–1.20)
Total Protein: 7.2 g/dL (ref 6.4–8.3)

## 2012-11-14 ENCOUNTER — Ambulatory Visit (HOSPITAL_BASED_OUTPATIENT_CLINIC_OR_DEPARTMENT_OTHER): Payer: BC Managed Care – PPO

## 2012-11-14 VITALS — BP 135/82 | HR 67 | Temp 98.2°F | Resp 20

## 2012-11-14 DIAGNOSIS — M810 Age-related osteoporosis without current pathological fracture: Secondary | ICD-10-CM

## 2012-11-14 DIAGNOSIS — Z853 Personal history of malignant neoplasm of breast: Secondary | ICD-10-CM

## 2012-11-14 MED ORDER — ZOLEDRONIC ACID 4 MG/100ML IV SOLN
4.0000 mg | Freq: Once | INTRAVENOUS | Status: AC
  Administered 2012-11-14: 4 mg via INTRAVENOUS
  Filled 2012-11-14: qty 100

## 2012-11-14 MED ORDER — SODIUM CHLORIDE 0.9 % IV SOLN
Freq: Once | INTRAVENOUS | Status: AC
  Administered 2012-11-14: 15:00:00 via INTRAVENOUS

## 2012-11-14 MED ORDER — SODIUM CHLORIDE 0.9 % IJ SOLN
3.0000 mL | Freq: Once | INTRAMUSCULAR | Status: DC | PRN
  Filled 2012-11-14: qty 10

## 2012-11-14 NOTE — Patient Instructions (Addendum)
Zoledronic Acid injection (Paget's Disease, Osteoporosis) What is this medicine? ZOLEDRONIC ACID (ZOE le dron ik AS id) lowers the amount of calcium loss from bone. It is used to treat Paget's disease and osteoporosis in women. This medicine may be used for other purposes; ask your health care provider or pharmacist if you have questions. What should I tell my health care provider before I take this medicine? They need to know if you have any of these conditions: -aspirin-sensitive asthma -dental disease -kidney disease -low levels of calcium in the blood -past surgery on the parathyroid gland or intestines -an unusual or allergic reaction to zoledronic acid, other medicines, foods, dyes, or preservatives -pregnant or trying to get pregnant -breast-feeding How should I use this medicine? This medicine is for infusion into a vein. It is given by a health care professional in a hospital or clinic setting. Talk to your pediatrician regarding the use of this medicine in children. This medicine is not approved for use in children. Overdosage: If you think you have taken too much of this medicine contact a poison control center or emergency room at once. NOTE: This medicine is only for you. Do not share this medicine with others. What if I miss a dose? It is important not to miss your dose. Call your doctor or health care professional if you are unable to keep an appointment. What may interact with this medicine? -certain antibiotics given by injection -NSAIDs, medicines for pain and inflammation, like ibuprofen or naproxen -some diuretics like bumetanide, furosemide -teriparatide This list may not describe all possible interactions. Give your health care provider a list of all the medicines, herbs, non-prescription drugs, or dietary supplements you use. Also tell them if you smoke, drink alcohol, or use illegal drugs. Some items may interact with your medicine. What should I watch for while  using this medicine? Visit your doctor or health care professional for regular checkups. It may be some time before you see the benefit from this medicine. Do not stop taking your medicine unless your doctor tells you to. Your doctor may order blood tests or other tests to see how you are doing. Women should inform their doctor if they wish to become pregnant or think they might be pregnant. There is a potential for serious side effects to an unborn child. Talk to your health care professional or pharmacist for more information. You should make sure that you get enough calcium and vitamin D while you are taking this medicine. Discuss the foods you eat and the vitamins you take with your health care professional. Some people who take this medicine have severe bone, joint, and/or muscle pain. This medicine may also increase your risk for a broken thigh bone. Tell your doctor right away if you have pain in your upper leg or groin. Tell your doctor if you have any pain that does not go away or that gets worse. What side effects may I notice from receiving this medicine? Side effects that you should report to your doctor or health care professional as soon as possible: -allergic reactions like skin rash, itching or hives, swelling of the face, lips, or tongue -breathing problems -changes in vision -feeling faint or lightheaded, falls -jaw burning, cramping, or pain -muscle cramps, stiffness, or weakness -trouble passing urine or change in the amount of urine Side effects that usually do not require medical attention (report to your doctor or health care professional if they continue or are bothersome): -bone, joint, or muscle pain -fever -  irritation at site where injected -loss of appetite -nausea, vomiting -stomach upset -tired This list may not describe all possible side effects. Call your doctor for medical advice about side effects. You may report side effects to FDA at 1-800-FDA-1088. Where  should I keep my medicine? This drug is given in a hospital or clinic and will not be stored at home. NOTE: This sheet is a summary. It may not cover all possible information. If you have questions about this medicine, talk to your doctor, pharmacist, or health care provider.  2013, Elsevier/Gold Standard. (08/15/2010 9:08:15 AM)  

## 2012-12-15 ENCOUNTER — Ambulatory Visit: Payer: BC Managed Care – PPO

## 2012-12-20 ENCOUNTER — Ambulatory Visit (INDEPENDENT_AMBULATORY_CARE_PROVIDER_SITE_OTHER): Payer: BC Managed Care – PPO

## 2012-12-20 ENCOUNTER — Ambulatory Visit: Payer: BC Managed Care – PPO

## 2012-12-20 DIAGNOSIS — Z23 Encounter for immunization: Secondary | ICD-10-CM

## 2013-01-20 ENCOUNTER — Other Ambulatory Visit: Payer: Self-pay | Admitting: Internal Medicine

## 2013-01-30 ENCOUNTER — Encounter: Payer: Self-pay | Admitting: Obstetrics and Gynecology

## 2013-02-28 ENCOUNTER — Other Ambulatory Visit: Payer: Self-pay

## 2013-02-28 NOTE — Telephone Encounter (Signed)
Pt is changing ins. Mail order is changing to [facility] [employer] effective Jan. 1, 2015.

## 2013-02-28 NOTE — Telephone Encounter (Signed)
The patient called and stated she needs to discuss her medications with the CMA   Callback - [phone]

## 2013-03-03 MED ORDER — SUMATRIPTAN SUCCINATE 100 MG PO TABS: 100.0000 mg | ORAL_TABLET | Freq: Once | ORAL | Status: DC

## 2013-03-03 MED ORDER — RANITIDINE HCL 150 MG PO TABS: 150.0000 mg | ORAL_TABLET | Freq: Two times a day (BID) | ORAL | Status: DC

## 2013-03-03 MED ORDER — CITALOPRAM HYDROBROMIDE 40 MG PO TABS: 40.0000 mg | ORAL_TABLET | Freq: Every day | ORAL | Status: DC

## 2013-03-15 ENCOUNTER — Other Ambulatory Visit (HOSPITAL_BASED_OUTPATIENT_CLINIC_OR_DEPARTMENT_OTHER): Payer: Commercial Managed Care - HMO

## 2013-03-15 DIAGNOSIS — Z853 Personal history of malignant neoplasm of breast: Secondary | ICD-10-CM

## 2013-03-15 LAB — COMPREHENSIVE METABOLIC PANEL (CC13)
ALBUMIN: 4.1 g/dL (ref 3.5–5.0)
ALK PHOS: 60 U/L (ref 40–150)
ALT: 13 U/L (ref 0–55)
AST: 18 U/L (ref 5–34)
Anion Gap: 9 mEq/L (ref 3–11)
BUN: 12 mg/dL (ref 7.0–26.0)
CO2: 28 mEq/L (ref 22–29)
Calcium: 9.7 mg/dL (ref 8.4–10.4)
Chloride: 100 mEq/L (ref 98–109)
Creatinine: 0.8 mg/dL (ref 0.6–1.1)
GLUCOSE: 99 mg/dL (ref 70–140)
POTASSIUM: 4.3 meq/L (ref 3.5–5.1)
SODIUM: 138 meq/L (ref 136–145)
TOTAL PROTEIN: 7.3 g/dL (ref 6.4–8.3)
Total Bilirubin: 0.39 mg/dL (ref 0.20–1.20)

## 2013-03-15 LAB — CBC WITH DIFFERENTIAL/PLATELET
BASO%: 0.7 % (ref 0.0–2.0)
Basophils Absolute: 0 10*3/uL (ref 0.0–0.1)
EOS%: 4.7 % (ref 0.0–7.0)
Eosinophils Absolute: 0.3 10*3/uL (ref 0.0–0.5)
HCT: 41 % (ref 34.8–46.6)
HGB: 13.8 g/dL (ref 11.6–15.9)
LYMPH%: 23.6 % (ref 14.0–49.7)
MCH: 31.9 pg (ref 25.1–34.0)
MCHC: 33.6 g/dL (ref 31.5–36.0)
MCV: 94.9 fL (ref 79.5–101.0)
MONO#: 0.5 10*3/uL (ref 0.1–0.9)
MONO%: 7.5 % (ref 0.0–14.0)
NEUT%: 63.5 % (ref 38.4–76.8)
NEUTROS ABS: 4.3 10*3/uL (ref 1.5–6.5)
Platelets: 270 10*3/uL (ref 145–400)
RBC: 4.32 10*6/uL (ref 3.70–5.45)
RDW: 13.4 % (ref 11.2–14.5)
WBC: 6.8 10*3/uL (ref 3.9–10.3)
lymph#: 1.6 10*3/uL (ref 0.9–3.3)

## 2013-03-16 ENCOUNTER — Telehealth: Payer: Self-pay | Admitting: Internal Medicine

## 2013-03-16 DIAGNOSIS — N951 Menopausal and female climacteric states: Secondary | ICD-10-CM

## 2013-03-16 DIAGNOSIS — C50919 Malignant neoplasm of unspecified site of unspecified female breast: Secondary | ICD-10-CM

## 2013-03-16 NOTE — Telephone Encounter (Signed)
Done Sch OV w/me as well - needs OV q 12 mo Thx

## 2013-03-16 NOTE — Telephone Encounter (Signed)
Done. Thx.

## 2013-03-16 NOTE — Telephone Encounter (Signed)
Pt's insurance has changed to [employer].  She needs a new referral to the [facility] to see Dr Jana Hakim.  Her [employer] ID number is [id number].  Pt will bring the new card to enter in the system.

## 2013-03-16 NOTE — Telephone Encounter (Signed)
03/16/2013  Pt is requesting referrals to [facility] and Dr. Quincy Simmonds, [facility].  Contact pt for more info if needed.

## 2013-03-17 NOTE — Telephone Encounter (Signed)
done

## 2013-03-17 NOTE — Telephone Encounter (Signed)
Notified patient previous request for new referral had been completed.  She forgot to inform during call yesterday, she also needs a referral to her gynecologist as well.  [place].  \  CB# [phone]

## 2013-03-17 NOTE — Telephone Encounter (Signed)
Brittany [name]- please call pt to schedule OV.

## 2013-03-22 ENCOUNTER — Encounter: Payer: Self-pay | Admitting: Internal Medicine

## 2013-03-22 ENCOUNTER — Ambulatory Visit (INDEPENDENT_AMBULATORY_CARE_PROVIDER_SITE_OTHER): Payer: Medicare HMO | Admitting: Internal Medicine

## 2013-03-22 ENCOUNTER — Ambulatory Visit (HOSPITAL_BASED_OUTPATIENT_CLINIC_OR_DEPARTMENT_OTHER): Payer: Commercial Managed Care - HMO | Admitting: Physician Assistant

## 2013-03-22 ENCOUNTER — Encounter: Payer: Self-pay | Admitting: Physician Assistant

## 2013-03-22 VITALS — BP 130/82 | HR 80 | Temp 98.8°F | Resp 16 | Wt 133.0 lb

## 2013-03-22 VITALS — BP 121/73 | HR 73 | Temp 98.0°F | Resp 18 | Ht 66.5 in | Wt 133.6 lb

## 2013-03-22 DIAGNOSIS — C50519 Malignant neoplasm of lower-outer quadrant of unspecified female breast: Secondary | ICD-10-CM

## 2013-03-22 DIAGNOSIS — E538 Deficiency of other specified B group vitamins: Secondary | ICD-10-CM

## 2013-03-22 DIAGNOSIS — Z853 Personal history of malignant neoplasm of breast: Secondary | ICD-10-CM

## 2013-03-22 DIAGNOSIS — E785 Hyperlipidemia, unspecified: Secondary | ICD-10-CM

## 2013-03-22 DIAGNOSIS — M810 Age-related osteoporosis without current pathological fracture: Secondary | ICD-10-CM

## 2013-03-22 DIAGNOSIS — N644 Mastodynia: Secondary | ICD-10-CM

## 2013-03-22 DIAGNOSIS — Z0000 Encounter for general adult medical examination without abnormal findings: Secondary | ICD-10-CM

## 2013-03-22 DIAGNOSIS — Z23 Encounter for immunization: Secondary | ICD-10-CM

## 2013-03-22 DIAGNOSIS — C50919 Malignant neoplasm of unspecified site of unspecified female breast: Secondary | ICD-10-CM

## 2013-03-22 DIAGNOSIS — IMO0001 Reserved for inherently not codable concepts without codable children: Secondary | ICD-10-CM

## 2013-03-22 MED ORDER — ZOLPIDEM TARTRATE 10 MG PO TABS: 10.0000 mg | ORAL_TABLET | Freq: Every evening | ORAL | Status: DC | PRN

## 2013-03-22 NOTE — Progress Notes (Signed)
   Subjective:    HPI  The patient is here to follow up on chronic depression, anxiety, B12 deficiency.  Wt Readings from Last 3 Encounters:  03/22/13 133 lb (60.328 kg)  09/19/12 131 lb 9.6 oz (59.693 kg)  08/02/12 132 lb 3.2 oz (59.966 kg)   BP Readings from Last 3 Encounters:  03/22/13 130/82  11/14/12 135/82  09/19/12 107/69      Review of Systems  Constitutional: Negative for activity change, appetite change, fatigue and unexpected weight change.  HENT: Positive for congestion. Negative for mouth sores and sinus pressure.   Eyes: Negative for visual disturbance.  Respiratory: Negative for chest tightness.   Gastrointestinal: Negative for nausea and abdominal pain.  Genitourinary: Negative for frequency, difficulty urinating and vaginal pain.  Musculoskeletal: Negative for back pain and gait problem.  Skin: Negative for pallor.  Neurological: Negative for dizziness, tremors, weakness and numbness.  Psychiatric/Behavioral: Negative for suicidal ideas, confusion and sleep disturbance.       Objective:   Physical Exam  Constitutional: She appears well-developed and well-nourished. No distress.  HENT:  Head: Normocephalic.  Right Ear: External ear normal.  Left Ear: External ear normal.  Nose: Nose normal.  Mouth/Throat: Oropharynx is clear and moist.  Pale nasal mucosa  Eyes: Conjunctivae are normal. Pupils are equal, round, and reactive to light. Right eye exhibits no discharge. Left eye exhibits no discharge.  Neck: Normal range of motion. Neck supple. No JVD present. No tracheal deviation present. No thyromegaly present.  Cardiovascular: Normal rate, regular rhythm and normal heart sounds.   Pulmonary/Chest: No stridor. No respiratory distress. She has no wheezes.  Abdominal: Soft. Bowel sounds are normal. She exhibits no distension and no mass. There is no tenderness. There is no rebound and no guarding.  Musculoskeletal: She exhibits no edema and no tenderness.   Lymphadenopathy:    She has no cervical adenopathy.  Neurological: She displays normal reflexes. No cranial nerve deficit. She exhibits normal muscle tone. Coordination normal.  Skin: No rash noted. No erythema.  Psychiatric: She has a normal mood and affect. Her behavior is normal. Judgment and thought content normal.             Assessment & Plan:

## 2013-03-22 NOTE — Assessment & Plan Note (Signed)
Continue with current prescription therapy as reflected on the Med list.  

## 2013-03-22 NOTE — Assessment & Plan Note (Signed)
Labs

## 2013-03-22 NOTE — Progress Notes (Signed)
ID: Brittany [name]   DOB: 03-12-1945  MR#: [id number]  CSN#:[id number]   PCP:  [name], MD GYN: SURGEON:  Cyndia Bent, MD RADONC:  Lurline Hare, MD OTHER:  Jetty Duhamel, MD  CHIEF COMPLAINT:  Hx of Right Breast Cancer   HISTORY OF PRESENT ILLNESS: The patient noted a mass in her right breast and immediately brought it to Dr. Cherlyn Labella attention 11/22/2007.  I should note that the patient had a negative routine screening mammogram at [facility] on 07/19/2007.  In the 11/22/2007 study, there was at least a 1.5-cm ill-defined density seen primarily on the right MLO view.  There were a few associated microcalcifications.  By palpation, Dr. Yolanda Bonine felt this was approximately 1.9 cm.  By ultrasound, this was an irregular hypoechoic mass measuring up to 1.9 cm.  There were a few irregular vessels present by Doppler.  There were no abnormal lymph nodes noted.  Breast specific gamma imaging was performed the next day, 11/23/2007, and showed a normal left breast.  On the right, there was a 1.6-cm high-density focus of abnormal isotope activity.  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  HER2-neu was negative at 1+. Her subsequent history is as detailed below.  INTERVAL HISTORY: Brittany [name] returns alone today for followup of her right breast cancer. She is doing well, and interval history is generally unremarkable. Her family is doing well. They were able to travel to [place] this summer to visit her son which she enjoyed a great deal.  Physically, Brittany [name] only complaint today is some tenderness in the lateral portion of the right breast, likely postsurgical pain. She describes this as being tenderness in the breast itself, not deep right rib pain.  REVIEW OF SYSTEMS: Brittany [name] has had no recent illnesses  and denies fevers, chills, or night sweats. Her energy level is good. She's had no rashes or skin changes and denies any abnormal bleeding. Her appetite is good. She denies any jaw pain or recent dental procedures. She has no nausea or change in bowel or bladder habits. No increased cough, shortness of breath, chest pain, or palpitations. She denies any abnormal headaches or dizziness. She's had no unusual myalgias, arthralgias, bony pain, or peripheral swelling.  A detailed review of systems is otherwise stable and noncontributory.    PAST MEDICAL HISTORY: Past Medical History  Diagnosis Date  . Cancer     breast right  triple recet neg, lumpect, chemo, XRT  . Breast cancer 11/23/2007    right, ER/PR -, HER 2 -  . History of radiation therapy 07/25/08 -09/11/08    right breast  . Osteopenia   . Esophageal stricture   . Gastritis 10/2007  . Migraines     rare  . Hiatal hernia   . Benign skin growth age 25    back of right calf, excised  . History of chemotherapy     neoadjuvant chemo, last dose 03/2008  Significant for osteopenia, history of hiatal hernia with esophageal stricture, status post upper endoscopy under [facility] 10/10/2007 showing an erosive gastritis in a background of reactive gastropathy ([id number]).  She has a history of rare migraines, and when she was 17, she had a benign growth removed from the back of her right calf.    PAST SURGICAL HISTORY: Past Surgical History  Procedure Laterality Date  .  Breast lumpectomy  06/26/2008    right, CA   XRT, chemo    FAMILY HISTORY Family History  Problem Relation Age of Onset  . Heart disease Father   . Cancer Father     lung  . Cancer Sister 26    Breast & Colorectal  . Epilepsy Mother   The patient's father died from lung cancer at the age of 59; he was not a smoker.  The patient's mother is alive at age 24; she has epilepsy.  The patient has a sister diagnosed with breast cancer when she was 52.  She is doing well.   The patient has a brother in good health.  There is no other history of breast or ovarian cancer in the family.  GYNECOLOGIC HISTORY: She is GX P3, first pregnancy to term age 22, last menstrual period when she was 68 years old.  She never took any hormones.     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  Their son, Greig Castilla, works for [employer], an Electronics engineer, and is working for his [employer]; he has 2 children and lives in [place], right outside of [place].  They have a daughter, Lanora Manis, who lives in [place] and has 3 children, and a son, Vonna Kotyk, who just married a young woman from [place] and is now living in [place].  The patient attends the [employer] [facility].   ADVANCED DIRECTIVES:  HEALTH MAINTENANCE:  (Updated January 2015) History  Substance Use Topics  . Smoking status: Former Smoker -- 5 years    Types: Cigarettes    Quit date: 03/03/1967  . Smokeless tobacco: Never Used     Comment: 6 cig per day when she was smoking/quit years ago  . Alcohol Use: Yes     Comment: 1 beer     Colonoscopy:  April 2007/Dr. Gessner  PAP: Not on file  Bone density: March 2013 at [facility], osteoporosis with   a T score of -2.5  Lipid panel: January 2015/Dr. Plotnikov   Allergies  Allergen Reactions  . Ibandronate Sodium     REACTION: intolerance--BONIVA    Current Outpatient Prescriptions  Medication Sig Dispense Refill  . ASPIRIN PO Take 81 mg by mouth daily.       [name] BIOTIN PO Take by mouth daily.      [name] CALCIUM CARBONATE PO Take by mouth 2 (two) times daily. 2000mg       . Cholecalciferol (VITAMIN D-3 PO) Take 1 tablet by mouth daily.       . citalopram (CELEXA) 40 MG tablet Take 1 tablet (40 mg total) by mouth daily.  30 tablet  0  . Cyanocobalamin (VITAMIN B-12 CR PO) Take by mouth.      . Omega-3 Fatty  Acids (FISH OIL) 1000 MG CAPS Take 1 capsule by mouth daily.        . ranitidine (ZANTAC) 150 MG tablet Take 1 tablet (150 mg total) by mouth 2 (two) times daily.  60 tablet  0  . SUMAtriptan (IMITREX) 100 MG tablet Take 1 tablet (100 mg total) by mouth once. May repeat in 2 hours if headache persists or recurs.  9 tablet  0  . diphenhydrAMINE (BENADRYL) 25 MG tablet Take 25 mg by mouth every 6 (six) hours as needed.      . mometasone-formoterol (DULERA) 100-5 MCG/ACT  AERO Inhale 2 puffs into the lungs 2 (two) times daily.  1 Inhaler  0  . montelukast (SINGULAIR) 10 MG tablet Take 1 tablet (10 mg total) by mouth at bedtime.  30 tablet  3  . zolpidem (AMBIEN) 10 MG tablet Take 1 tablet (10 mg total) by mouth at bedtime as needed for sleep.  90 tablet  1   No current facility-administered medications for this visit.    OBJECTIVE: Middle-aged white woman in no acute distress Filed Vitals:   03/22/13 1643  BP: 121/73  Pulse: 73  Temp: 98 F (36.7 C)  Resp: 18     Body mass index is 20.92 kg/(m^2).    ECOG FS: 0 Filed Weights   03/22/13 1643  Weight: 133 lb 9.6 oz (60.601 kg)   Physical Exam: HEENT:  Sclerae anicteric.  Oropharynx clear, pink, and moist.  Good dentition. Neck is supple, trachea midline. No thyromegaly.  NODES:  No cervical or supraclavicular lymphadenopathy palpated.  BREAST EXAM:  Right breast is status post lumpectomy, mildly tender to palpation, but with no suspicious nodularities or skin changes. Left breast is benign. Breast tissue is dense to palpation bilaterally. Axillae are clear, with no palpable lymphadenopathy. LUNGS:  Clear to auscultation bilaterally.  No wheezes or rhonchi HEART:  Regular rate and rhythm. No murmur appreciated ABDOMEN:  Soft, nontender. No hepatomegaly palpated.  Positive bowel sounds.  MSK:  No focal spinal tenderness to palpation. Full range of motion bilaterally in the upper extremities. Patient does have a splint on the fourth digit of  the left hand status post injury in July. There is no obvious swelling. EXTREMITIES:  No peripheral edema.   SKIN:  Benign with no visible rashes or skin lesions. No pallor. No ecchymoses or petechiae. NEURO:  Nonfocal. Well oriented.  Positive affect.    LAB RESULTS: Lab Results  Component Value Date   WBC 6.8 03/15/2013   NEUTROABS 4.3 03/15/2013   HGB 13.8 03/15/2013   HCT 41.0 03/15/2013   MCV 94.9 03/15/2013   PLT 270 03/15/2013      Chemistry      Component Value Date/Time   NA 138 03/15/2013 1413   NA 139 10/05/2011 1317   K 4.3 03/15/2013 1413   K 3.7 10/05/2011 1317   CL 98 03/21/2012 1053   CL 102 10/05/2011 1317   CO2 28 03/15/2013 1413   CO2 27 10/05/2011 1317   BUN 12.0 03/15/2013 1413   BUN 8 10/05/2011 1317   CREATININE 0.8 03/15/2013 1413   CREATININE 0.65 10/05/2011 1317      Component Value Date/Time   CALCIUM 9.7 03/15/2013 1413   CALCIUM 9.7 10/05/2011 1317   ALKPHOS 60 03/15/2013 1413   ALKPHOS 45 03/21/2012 1129   AST 18 03/15/2013 1413   AST 16 03/21/2012 1129   ALT 13 03/15/2013 1413   ALT 11 03/21/2012 1129   BILITOT 0.39 03/15/2013 1413   BILITOT 0.4 03/21/2012 1129        STUDIES:  Most recent bone density at [facility] on 05/13/2011 showed osteoporosis with a T score of -2.5. This is scheduled to be repeated in March 2015.  Bilateral mammogram at [facility] on 05/13/2012 was unremarkable. It did note the presence of heterogeneously dense breast tissue bilaterally.   ASSESSMENT: 68 y.o. 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  (2) status post  neoadjuvant chemotherapy, which consisted of docetaxel/ gemcitabine/ bevacizumab followed by doxorubicin/ cyclophosphamide/ bevacizumab as per the NSABP B-40 protocol.  Last bevacizumab dose was January of 2010.   (3) status-post right lumpectomy and sentinel lymph node dissection in April of 2010 showing a  complete pathologic response   (4) She completed radiation July of 2010.   (5)  osteoporosis being treated with annual zoledronic acid, first dose given October 2013 with good tolerance.  PLAN:  With regards to her breast cancer, Brittany [name] appears to be doing well with no clinical evidence of disease progression. She is now almost 5 years out from her definitive surgery in April of 2010. She'll let me know if the pain in the right breast worsens, but I do believe this is going to be associated with postsurgical pain, as it is in the area of the initial lumpectomy incision. Otherwise, Brittany [name] is already scheduled for her annual mammogram at [facility] in March. We will also schedule a bone density at the same time to assess her osteoporosis, and assess her response thus far to the zoledronic acid.  Brittany [name] will return for labs and a followup visit with Dr. Darnelle Catalan in July. At that time, then we'll also schedule her for her next annual dose of zoledronic acid which will be due in October 2015.  She would like to continue being seen on a q. 6 month basis.  All this was reviewed in detail with Brittany [name] today. She voices understanding and agreement with this plan. She knows to call with any changes or problems.   Brittany Remington PA-C    03/22/2013

## 2013-03-22 NOTE — Progress Notes (Signed)
Pre visit review using our clinic review tool, if applicable. No additional management support is needed unless otherwise documented below in the visit note. 

## 2013-03-23 ENCOUNTER — Encounter: Payer: Self-pay | Admitting: Internal Medicine

## 2013-03-23 ENCOUNTER — Telehealth: Payer: Self-pay | Admitting: Oncology

## 2013-03-23 ENCOUNTER — Other Ambulatory Visit (INDEPENDENT_AMBULATORY_CARE_PROVIDER_SITE_OTHER): Payer: Medicare HMO

## 2013-03-23 DIAGNOSIS — E785 Hyperlipidemia, unspecified: Secondary | ICD-10-CM

## 2013-03-23 DIAGNOSIS — IMO0001 Reserved for inherently not codable concepts without codable children: Secondary | ICD-10-CM

## 2013-03-23 DIAGNOSIS — E538 Deficiency of other specified B group vitamins: Secondary | ICD-10-CM

## 2013-03-23 DIAGNOSIS — Z0000 Encounter for general adult medical examination without abnormal findings: Secondary | ICD-10-CM

## 2013-03-23 LAB — LIPID PANEL
Cholesterol: 245 mg/dL — ABNORMAL HIGH (ref 0–200)
HDL: 59.6 mg/dL (ref >39.00)
TRIGLYCERIDES: 53 mg/dL (ref 0.0–149.0)
Total CHOL/HDL Ratio: 4
VLDL: 10.6 mg/dL (ref 0.0–40.0)

## 2013-03-23 LAB — URINALYSIS, ROUTINE W REFLEX MICROSCOPIC
Bilirubin Urine: NEGATIVE
Hgb urine dipstick: NEGATIVE
Ketones, ur: NEGATIVE
Nitrite: NEGATIVE
PH: 7 (ref 5.0–8.0)
SPECIFIC GRAVITY, URINE: 1.01 (ref 1.000–1.030)
TOTAL PROTEIN, URINE-UPE24: NEGATIVE
URINE GLUCOSE: NEGATIVE
Urobilinogen, UA: 0.2 (ref 0.0–1.0)

## 2013-03-23 LAB — VITAMIN B12: Vitamin B-12: 1012 pg/mL — ABNORMAL HIGH (ref 211–911)

## 2013-03-23 LAB — LDL CHOLESTEROL, DIRECT: LDL DIRECT: 178.9 mg/dL

## 2013-03-23 LAB — TSH: TSH: 1.41 u[IU]/mL (ref 0.35–5.50)

## 2013-03-23 NOTE — Telephone Encounter (Signed)
S/w Brittany [name] from the [facility] and the pt is scheduled for her mammo/bone density appts in march.

## 2013-03-23 NOTE — Assessment & Plan Note (Signed)
F/u w/Dr [name] 

## 2013-03-28 ENCOUNTER — Telehealth: Payer: Self-pay | Admitting: Internal Medicine

## 2013-03-28 NOTE — Telephone Encounter (Signed)
Patient called and left a VM on the triage line in regards to her cholesterol results. She is concerned that her cholesterol was high and wants to know if she should be concerned.

## 2013-03-28 NOTE — Telephone Encounter (Signed)
HDL- "good cholesterol" is high which is protective. It is a soft call weather to start a cholesterol lowering Rx or not.  We can try Lipitor. Another option would be to try fish oil. Recheck lipids in 3 mo Thx

## 2013-03-29 ENCOUNTER — Encounter: Payer: Self-pay | Admitting: Obstetrics and Gynecology

## 2013-03-29 NOTE — Telephone Encounter (Signed)
Left mess for patient to call back.  

## 2013-03-31 ENCOUNTER — Ambulatory Visit: Payer: Self-pay | Admitting: Obstetrics and Gynecology

## 2013-03-31 NOTE — Telephone Encounter (Signed)
Patient has been advised of results. She is interested in starting Lipitor and would like a prescription sent to [facility].

## 2013-04-03 ENCOUNTER — Ambulatory Visit: Payer: Self-pay | Admitting: Obstetrics and Gynecology

## 2013-04-03 MED ORDER — ATORVASTATIN CALCIUM 10 MG PO TABS: 10.0000 mg | ORAL_TABLET | Freq: Every day | ORAL | Status: DC

## 2013-04-03 NOTE — Telephone Encounter (Signed)
Done. Thx.

## 2013-04-10 ENCOUNTER — Encounter: Payer: Self-pay | Admitting: Obstetrics and Gynecology

## 2013-04-10 ENCOUNTER — Ambulatory Visit: Payer: Self-pay | Admitting: Obstetrics and Gynecology

## 2013-04-14 ENCOUNTER — Other Ambulatory Visit: Payer: Self-pay | Admitting: *Deleted

## 2013-04-14 MED ORDER — CITALOPRAM HYDROBROMIDE 40 MG PO TABS: 40.0000 mg | ORAL_TABLET | Freq: Every day | ORAL | Status: DC

## 2013-04-17 ENCOUNTER — Telehealth: Payer: Self-pay | Admitting: *Deleted

## 2013-04-17 NOTE — Telephone Encounter (Signed)
Patient phoned requesting refill for her citalopram.  Reviewed MAR, pharmacy confirmed receipt of script at 1358 Friday 04/14/13.  Phoned patient and left voicemail message to that info.

## 2013-04-19 ENCOUNTER — Other Ambulatory Visit: Payer: Self-pay | Admitting: *Deleted

## 2013-04-19 MED ORDER — CITALOPRAM HYDROBROMIDE 40 MG PO TABS: 40.0000 mg | ORAL_TABLET | Freq: Every day | ORAL | Status: DC

## 2013-05-18 ENCOUNTER — Encounter: Payer: Self-pay | Admitting: Obstetrics and Gynecology

## 2013-05-18 ENCOUNTER — Ambulatory Visit (INDEPENDENT_AMBULATORY_CARE_PROVIDER_SITE_OTHER): Payer: Commercial Managed Care - HMO | Admitting: Obstetrics and Gynecology

## 2013-05-18 VITALS — BP 106/61 | HR 80 | Resp 20 | Ht 66.5 in | Wt 134.0 lb

## 2013-05-18 DIAGNOSIS — Z01419 Encounter for gynecological examination (general) (routine) without abnormal findings: Secondary | ICD-10-CM

## 2013-05-18 NOTE — Patient Instructions (Signed)

## 2013-05-18 NOTE — Progress Notes (Signed)
GYNECOLOGY VISIT  PCP:   Referring provider:   HPI: 68 y.o.   Married  Caucasian  female   G3P3 with No LMP recorded. Patient is postmenopausal.   here for annual exam. History of right breast cancer.  Status post lumpectomy, XRT, and chemotherapy.   Had mammogram and bone density through Dr. Jana Hakim. Doing Zometa injections yearly through Dr. Jana Hakim.   Started Lipitor for elevated cholesterol.  Hgb:  PCP Urine:  PCP  GYNECOLOGIC HISTORY: No LMP recorded. Patient is postmenopausal. Sexually active:  Yes  Partner preference: Female Contraception:   None Menopausal hormone therapy: none DES exposure:   none Blood transfusions:   none Sexually transmitted diseases:   none GYN procedures and prior surgeries:  none Last mammogram:    05/15/2013  - normal per patient.         Last pap and high risk HPV testing:   01/2010 Neg. NO HR HPV tested History of abnormal pap smear:  none   OB History   Grav Para Term Preterm Abortions TAB SAB Ect Mult Living   3 3        3        LIFESTYLE: Exercise:    Yes, Aerobics, zumba           Tobacco:  Fomer Alcohol: No Drug use:  No  OTHER HEALTH MAINTENANCE: Tetanus/TDap: 2011 Gardisil: none Influenza:  10/2012 Zostavax: 2013  Bone density: 05/09/2013  Colonoscopy: 2013 - every 5 year.  Cholesterol check: about 1 month ago. Taking Lipitor.   Family History  Problem Relation Age of Onset  . Heart disease Father   . Cancer Father     lung  . Cancer Sister 58    Breast & Colorectal  . Epilepsy Mother     Patient Active Problem List   Diagnosis Date Noted  . Breast cancer   . History of radiation therapy   . Hiatal hernia   . History of chemotherapy   . Cancer   . Osteoporosis 03/21/2012  . Hyperlipidemia 12/18/2011  . Seasonal and perennial allergic rhinitis 12/17/2011  . Cystitis 01/15/2011  . Well adult exam 11/11/2010  . VITAMIN B12 DEFICIENCY 10/09/2009  . TOBACCO USE, QUIT 01/31/2009  . THYROID NODULE  12/25/2008  . IDC, Right Breast, Stage I, Triple negative 11/23/2007  . ESOPHAGEAL STRICTURE 11/23/2007  . GERD 11/23/2007  . VARICOSE VEIN 12/29/2006  . ANXIETY 12/23/2006  . Depressive disorder, not elsewhere classified 12/23/2006  . 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 OSTEOPENIA 12/23/2006   Past Medical History  Diagnosis Date  . Cancer     breast right  triple recet neg, lumpect, chemo, XRT  . Breast cancer 11/23/2007    right, ER/PR -, HER 2 -  . History of radiation therapy 07/25/08 -09/11/08    right breast  . Osteopenia   . Esophageal stricture   . Gastritis 10/2007  . Migraines     rare  . History of chemotherapy     neoadjuvant chemo, last dose 03/2008    Past Surgical History  Procedure Laterality Date  . Breast lumpectomy  06/26/2008    right, CA   XRT, chemo    ALLERGIES: Ibandronate sodium  Current Outpatient Prescriptions  Medication Sig Dispense Refill  . ASPIRIN PO Take 81 mg by mouth daily.       [name] atorvastatin (LIPITOR) 10 MG tablet Take 1 tablet (10 mg total) by mouth daily.  90 tablet  3  . BIOTIN PO  Take by mouth daily.      [name] CALCIUM CITRATE-VITAMIN D PO       . Calcium-Vitamin D-Vitamin K [id number] MG-UNT-MCG CHEW       . Cholecalciferol (VITAMIN D-3 PO) Take 1 tablet by mouth daily.       . citalopram (CELEXA) 40 MG tablet Take 1 tablet (40 mg total) by mouth daily.  90 tablet  3  . Cyanocobalamin (VITAMIN B-12 CR PO) Take by mouth.      . diphenhydrAMINE (BENADRYL) 25 MG tablet Take 25 mg by mouth every 6 (six) hours as needed.      . Omega-3 Fatty Acids (FISH OIL) 1000 MG CAPS Take 1 capsule by mouth daily.        . ranitidine (ZANTAC) 150 MG tablet Take 1 tablet (150 mg total) by mouth 2 (two) times daily.  60 tablet  0  . SUMAtriptan (IMITREX) 100 MG tablet Take 1 tablet (100 mg total) by mouth once. May repeat in 2 hours if headache persists or recurs.  9 tablet  0  . mometasone-formoterol (DULERA) 100-5 MCG/ACT AERO  Inhale 2 puffs into the lungs 2 (two) times daily.  1 Inhaler  0  . montelukast (SINGULAIR) 10 MG tablet Take 1 tablet (10 mg total) by mouth at bedtime.  30 tablet  3  . zolpidem (AMBIEN) 10 MG tablet Take 5 mg by mouth at bedtime as needed for sleep.       No current facility-administered medications for this visit.     ROS:  Pertinent items are noted in HPI.  SOCIAL HISTORY:  Married.   PHYSICAL EXAMINATION:    BP 106/61  Pulse 80  Resp 20  Ht 5' 6.5" (1.689 m)  Wt 134 lb (60.782 kg)  BMI 21.31 kg/m2   Wt Readings from Last 3 Encounters:  05/18/13 134 lb (60.782 kg)  03/22/13 133 lb 9.6 oz (60.601 kg)  03/22/13 133 lb (60.328 kg)     Ht Readings from Last 3 Encounters:  05/18/13 5' 6.5" (1.689 m)  03/22/13 5' 6.5" (1.689 m)  09/19/12 5' 6.5" (1.689 m)    General appearance: alert, cooperative and appears stated age Head: Normocephalic, without obvious abnormality, atraumatic Neck: no adenopathy, supple, symmetrical, trachea midline and thyroid not enlarged, symmetric, no tenderness/mass/nodules Lungs: clear to auscultation bilaterally Breasts: Inspection negative on the left, right breast with scarring in the upper outer quadrant with retraction, No nipple retraction or dimpling, No nipple discharge or bleeding, No axillary or supraclavicular adenopathy, Normal to palpation without dominant masses Heart: regular rate and rhythm Abdomen: soft, non-tender; no masses,  no organomegaly Extremities: extremities normal, atraumatic, no cyanosis or edema Skin: Skin color, texture, turgor normal. No rashes or lesions Lymph nodes: Cervical, supraclavicular, and axillary nodes normal. No abnormal inguinal nodes palpated Neurologic: Grossly normal  Pelvic: External genitalia:  no lesions              Urethra:  normal appearing urethra with no masses, tenderness or lesions              Bartholins and Skenes: normal                 Vagina: normal appearing vagina with normal color  and discharge, no lesions.  Atrophic changes - erythema, petechiae, bleeds easily.               Cervix: normal appearance              Pap and  high risk HPV testing done: yes.  Pap only.             Bimanual Exam:  Uterus:  uterus is normal size, shape, consistency and nontender                                      Adnexa: normal adnexa in size, nontender and no masses                                      Rectovaginal: Confirms                                      Anus:  normal sphincter tone, no lesions  ASSESSMENT  Normal gynecologic exam. History of right breast cancer. Atrophic vaginal changes.  Osteopenia.  Bone density pending.   PLAN  Mammogram recommended yearly.  Pap smear performed.  Counseled on self breast exam, Calcium and vitamin D intake, exercise. I discussed water based lubricants, olive oil, and vit E vaginal suppositories for atrophic changes.  Return annually or prn   An After Visit Summary was printed and given to the patient.

## 2013-05-19 LAB — IPS PAP SMEAR ONLY

## 2013-05-26 ENCOUNTER — Other Ambulatory Visit: Payer: Self-pay | Admitting: *Deleted

## 2013-05-26 MED ORDER — SUMATRIPTAN SUCCINATE 100 MG PO TABS: 100.0000 mg | ORAL_TABLET | Freq: Once | ORAL | Status: DC

## 2013-06-21 ENCOUNTER — Other Ambulatory Visit: Payer: Self-pay | Admitting: *Deleted

## 2013-06-21 MED ORDER — RANITIDINE HCL 150 MG PO TABS: 150.0000 mg | ORAL_TABLET | Freq: Two times a day (BID) | ORAL | Status: DC

## 2013-07-18 ENCOUNTER — Ambulatory Visit (INDEPENDENT_AMBULATORY_CARE_PROVIDER_SITE_OTHER): Payer: Commercial Managed Care - HMO | Admitting: Internal Medicine

## 2013-07-18 ENCOUNTER — Other Ambulatory Visit (INDEPENDENT_AMBULATORY_CARE_PROVIDER_SITE_OTHER): Payer: Commercial Managed Care - HMO

## 2013-07-18 ENCOUNTER — Encounter: Payer: Self-pay | Admitting: Internal Medicine

## 2013-07-18 VITALS — BP 108/58 | HR 64 | Temp 97.8°F | Resp 16 | Wt 131.0 lb

## 2013-07-18 DIAGNOSIS — M791 Myalgia, unspecified site: Secondary | ICD-10-CM

## 2013-07-18 DIAGNOSIS — E785 Hyperlipidemia, unspecified: Secondary | ICD-10-CM

## 2013-07-18 DIAGNOSIS — IMO0001 Reserved for inherently not codable concepts without codable children: Secondary | ICD-10-CM

## 2013-07-18 DIAGNOSIS — E538 Deficiency of other specified B group vitamins: Secondary | ICD-10-CM

## 2013-07-18 HISTORY — DX: Myalgia, unspecified site: M79.10

## 2013-07-18 LAB — LIPID PANEL
CHOL/HDL RATIO: 3
CHOLESTEROL: 159 mg/dL (ref 0–200)
HDL: 60.4 mg/dL (ref >39.00)
LDL CALC: 90 mg/dL (ref 0–99)
TRIGLYCERIDES: 45 mg/dL (ref 0.0–149.0)
VLDL: 9 mg/dL (ref 0.0–40.0)

## 2013-07-18 LAB — HEPATIC FUNCTION PANEL
ALT: 15 U/L (ref 0–35)
AST: 23 U/L (ref 0–37)
Albumin: 4.4 g/dL (ref 3.5–5.2)
Alkaline Phosphatase: 48 U/L (ref 39–117)
BILIRUBIN DIRECT: 0.1 mg/dL (ref 0.0–0.3)
BILIRUBIN TOTAL: 0.5 mg/dL (ref 0.2–1.2)
Total Protein: 7.5 g/dL (ref 6.0–8.3)

## 2013-07-18 LAB — CK: Total CK: 44 U/L (ref 7–177)

## 2013-07-18 NOTE — Assessment & Plan Note (Signed)
5/15 multiple site Hold Lipitor Labs X rays if needed

## 2013-07-18 NOTE — Assessment & Plan Note (Signed)
Labs Hold Lipitor 

## 2013-07-18 NOTE — Assessment & Plan Note (Signed)
Continue with current prescription therapy as reflected on the Med list.  

## 2013-07-18 NOTE — Patient Instructions (Signed)
Hold Lipitor x 2 weeks Call me if not better

## 2013-07-18 NOTE — Progress Notes (Signed)
   Subjective:    HPI  C/o pains in different joint and muscle locations for weeks The patient is here to follow up on chronic depression, anxiety, B12 deficiency.  Wt Readings from Last 3 Encounters:  07/18/13 131 lb (59.421 kg)  05/18/13 134 lb (60.782 kg)  03/22/13 133 lb 9.6 oz (60.601 kg)   BP Readings from Last 3 Encounters:  07/18/13 108/58  05/18/13 106/61  03/22/13 121/73      Review of Systems  Constitutional: Negative for activity change, appetite change, fatigue and unexpected weight change.  HENT: Positive for congestion. Negative for mouth sores and sinus pressure.   Eyes: Negative for visual disturbance.  Respiratory: Negative for chest tightness.   Gastrointestinal: Negative for nausea and abdominal pain.  Genitourinary: Negative for frequency, difficulty urinating and vaginal pain.  Musculoskeletal: Negative for back pain and gait problem.  Skin: Negative for pallor.  Neurological: Negative for dizziness, tremors, weakness and numbness.  Psychiatric/Behavioral: Negative for suicidal ideas, confusion and sleep disturbance.       Objective:   Physical Exam  Constitutional: She appears well-developed and well-nourished. No distress.  HENT:  Head: Normocephalic.  Right Ear: External ear normal.  Left Ear: External ear normal.  Nose: Nose normal.  Mouth/Throat: Oropharynx is clear and moist.  Pale nasal mucosa  Eyes: Conjunctivae are normal. Pupils are equal, round, and reactive to light. Right eye exhibits no discharge. Left eye exhibits no discharge.  Neck: Normal range of motion. Neck supple. No JVD present. No tracheal deviation present. No thyromegaly present.  Cardiovascular: Normal rate, regular rhythm and normal heart sounds.   Pulmonary/Chest: No stridor. No respiratory distress. She has no wheezes.  Abdominal: Soft. Bowel sounds are normal. She exhibits no distension and no mass. There is no tenderness. There is no rebound and no guarding.   Musculoskeletal: She exhibits no edema and no tenderness.  Lymphadenopathy:    She has no cervical adenopathy.  Neurological: She displays normal reflexes. No cranial nerve deficit. She exhibits normal muscle tone. Coordination normal.  Skin: No rash noted. No erythema.  Psychiatric: She has a normal mood and affect. Her behavior is normal. Judgment and thought content normal.             Assessment & Plan:

## 2013-07-18 NOTE — Progress Notes (Signed)
Pre visit review using our clinic review tool, if applicable. No additional management support is needed unless otherwise documented below in the visit note. 

## 2013-08-03 ENCOUNTER — Telehealth: Payer: Self-pay | Admitting: Internal Medicine

## 2013-08-10 ENCOUNTER — Telehealth: Payer: Self-pay | Admitting: *Deleted

## 2013-08-10 NOTE — Telephone Encounter (Signed)
Spoke with patient and confirmed new appointment for 08/22/13 10am for labs and 1030 with Dr. Grayland Ormond.

## 2013-08-21 ENCOUNTER — Ambulatory Visit (HOSPITAL_BASED_OUTPATIENT_CLINIC_OR_DEPARTMENT_OTHER): Payer: Commercial Managed Care - HMO | Admitting: Hematology and Oncology

## 2013-08-21 ENCOUNTER — Ambulatory Visit (HOSPITAL_BASED_OUTPATIENT_CLINIC_OR_DEPARTMENT_OTHER): Payer: Commercial Managed Care - HMO

## 2013-08-21 VITALS — BP 116/66 | HR 77 | Temp 98.7°F | Resp 20 | Ht 66.5 in | Wt 133.2 lb

## 2013-08-21 DIAGNOSIS — C50911 Malignant neoplasm of unspecified site of right female breast: Secondary | ICD-10-CM

## 2013-08-21 DIAGNOSIS — Z853 Personal history of malignant neoplasm of breast: Secondary | ICD-10-CM

## 2013-08-21 DIAGNOSIS — IMO0001 Reserved for inherently not codable concepts without codable children: Secondary | ICD-10-CM

## 2013-08-21 DIAGNOSIS — M810 Age-related osteoporosis without current pathological fracture: Secondary | ICD-10-CM | POA: Diagnosis not present

## 2013-08-21 DIAGNOSIS — C801 Malignant (primary) neoplasm, unspecified: Secondary | ICD-10-CM

## 2013-08-21 LAB — COMPREHENSIVE METABOLIC PANEL (CC13)
ALK PHOS: 61 U/L (ref 40–150)
ALT: 11 U/L (ref 0–55)
AST: 16 U/L (ref 5–34)
Albumin: 3.8 g/dL (ref 3.5–5.0)
Anion Gap: 8 mEq/L (ref 3–11)
BUN: 14.3 mg/dL (ref 7.0–26.0)
CALCIUM: 9.4 mg/dL (ref 8.4–10.4)
CO2: 27 mEq/L (ref 22–29)
CREATININE: 0.9 mg/dL (ref 0.6–1.1)
Chloride: 103 mEq/L (ref 98–109)
Glucose: 117 mg/dl (ref 70–140)
Potassium: 4 mEq/L (ref 3.5–5.1)
Sodium: 138 mEq/L (ref 136–145)
Total Bilirubin: 0.34 mg/dL (ref 0.20–1.20)
Total Protein: 6.8 g/dL (ref 6.4–8.3)

## 2013-08-21 LAB — CBC WITH DIFFERENTIAL/PLATELET
BASO%: 0.9 % (ref 0.0–2.0)
BASOS ABS: 0.1 10*3/uL (ref 0.0–0.1)
EOS%: 4.1 % (ref 0.0–7.0)
Eosinophils Absolute: 0.3 10*3/uL (ref 0.0–0.5)
HCT: 40.5 % (ref 34.8–46.6)
HGB: 13.3 g/dL (ref 11.6–15.9)
LYMPH#: 1.5 10*3/uL (ref 0.9–3.3)
LYMPH%: 24.4 % (ref 14.0–49.7)
MCH: 31.4 pg (ref 25.1–34.0)
MCHC: 32.7 g/dL (ref 31.5–36.0)
MCV: 96 fL (ref 79.5–101.0)
MONO#: 0.5 10*3/uL (ref 0.1–0.9)
MONO%: 7.8 % (ref 0.0–14.0)
NEUT#: 3.9 10*3/uL (ref 1.5–6.5)
NEUT%: 62.8 % (ref 38.4–76.8)
PLATELETS: 264 10*3/uL (ref 145–400)
RBC: 4.22 10*6/uL (ref 3.70–5.45)
RDW: 13.4 % (ref 11.2–14.5)
WBC: 6.2 10*3/uL (ref 3.9–10.3)

## 2013-08-21 NOTE — Progress Notes (Signed)
ID: Brittany [name]   DOB: 1945/07/23  MR#: [id number]  GUR#:[id number]   PCP:  Walker Kehr, MD GYN: SURGEON:  Neldon Mc, MD [place]:  Thea Silversmith, MD OTHER:  Baird Lyons, MD  CHIEF COMPLAINT:  Follow up visit for Right Breast Cancer  HISTORY OF PRESENT ILLNESS: From original  intake note:  The patient noted a mass in her right breast and immediately brought it to Dr. Gareth Eagle attention 11/22/2007.  I should note that the patient had a negative routine screening mammogram at [facility] on 07/19/2007.  In the 11/22/2007 study, there was at least a 1.5-cm ill-defined density seen primarily on the right MLO view.  There were a few associated microcalcifications.  By palpation, Dr. Isaiah Blakes felt this was approximately 1.9 cm.  By ultrasound, this was an irregular hypoechoic mass measuring up to 1.9 cm.  There were a few irregular vessels present by Doppler.  There were no abnormal lymph nodes noted.  Breast specific gamma imaging was performed the next day, 11/23/2007, and showed a normal left breast.  On the right, there was a 1.6-cm high-density focus of abnormal isotope activity.  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  HER2-neu was negative at 1+. Her subsequent history is as detailed below.  INTERVAL HISTORY: Brittany [name] is here for followup visit today. Since the time of last visit, she had a mammogram done in March 2015 and that revealed no evidence of any malignancy. She also had a bone density scan done in March 2015 that revealed improvement in her bone mass. 2 months ago she was started on statin drug for her hypercholesterolemia since the time of starting statin she started having myalgias and she says the pains are migrating intermittent pains in the body. Approximately 6 weeks ago  starting drug was stopped but she continued to have the intermittent pains. She is maintaining her weight denies any decrease in appetite, shortness of breath, chest pain, palpitations, blood in the stool or blood in the urine. Denies any headaches blurred vision or dizziness. Denies any hot flashes She continued to take calcium with vitamin D supplementation  REVIEW OF SYSTEMS: A detailed 10 point review systems is been assessed and pertinent symptoms as mentioned in interval history  PAST MEDICAL HISTORY: Past Medical History  Diagnosis Date  . Cancer     breast right  triple recet neg, lumpect, chemo, XRT  . Breast cancer 11/23/2007    right, ER/PR -, HER 2 -  . History of radiation therapy 07/25/08 -09/11/08    right breast  . Osteopenia   . Esophageal stricture   . Gastritis 10/2007  . Migraines     rare  . History of chemotherapy     neoadjuvant chemo, last dose 03/2008  Significant for osteopenia, history of hiatal hernia with esophageal stricture, status post upper endoscopy under Silv[facility] Rusk 10/10/2007 showing an erosive gastritis in a background of reactive gastropathy ([id number]).  She has a history of rare migraines, and when she was 28, she had a benign growth removed from the back of her right calf.    PAST SURGICAL HISTORY: Past Surgical History  Procedure Laterality Date  . Breast lumpectomy  06/26/2008    right, CA   XRT, chemo    FAMILY HISTORY Family History  Problem Relation Age of Onset  .  Heart disease Father   . Cancer Father     lung  . Cancer Sister 92    Breast & Colorectal  . Epilepsy Mother   The patient's father died from lung cancer at the age of 37; he was not a smoker.  The patient's mother is alive at age 59; she has epilepsy.  The patient has a sister diagnosed with breast cancer when she was 55.  She is doing well.  The patient has a brother in good health.  There is no other history of breast or ovarian cancer in the family.  GYNECOLOGIC  HISTORY: She is GX P3, first pregnancy to term age 72, last menstrual period when she was 68 years old.  She never took any hormones.     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  Their son, [name], works for [employer], an Automotive engineer, and is working for his [employer]; he has 2 children and lives in [place], right outside of [place].  They have a daughter, [name], who lives in [place] and has 3 children, and a son, [name], who just married a young woman from [place] and is now living in [place]  The patient attends the [employer].   ADVANCED DIRECTIVES:  HEALTH MAINTENANCE:  (Updated January 2015) History  Substance Use Topics  . Smoking status: Former Smoker -- 5 years    Types: Cigarettes    Quit date: 03/03/1967  . Smokeless tobacco: Never Used     Comment: 6 cig per day when she was smoking/quit years ago  . Alcohol Use: No     Comment: 1 beer     Colonoscopy:  April 2007/Dr. Gessner  PAP: Not on file  Bone density: March 2013 at [facility], osteoporosis with   a T score of -2.5  Lipid panel: January 2015/Dr. Plotnikov   Allergies  Allergen Reactions  . Ibandronate Sodium     REACTION: intolerance--BONIVA    Current Outpatient Prescriptions  Medication Sig Dispense Refill  . ASPIRIN PO Take 81 mg by mouth daily.       [name] BIOTIN PO Take by mouth daily.      [name] CALCIUM CITRATE-VITAMIN D PO       . Cholecalciferol (VITAMIN D-3 PO) Take 1 tablet by mouth daily.       . citalopram (CELEXA) 40 MG tablet Take 1 tablet (40 mg total) by mouth daily.  90 tablet  3  . Cyanocobalamin (VITAMIN B-12 CR PO) Take by mouth.      . diphenhydrAMINE (BENADRYL) 25 MG tablet Take 25 mg by mouth every 6 (six) hours as needed.      . Omega-3 Fatty Acids (FISH OIL) 1000 MG CAPS Take 1 capsule by mouth daily.         . ranitidine (ZANTAC) 150 MG tablet Take 1 tablet (150 mg total) by mouth 2 (two) times daily.  180 tablet  1  . SUMAtriptan (IMITREX) 100 MG tablet Take 1 tablet (100 mg total) by mouth once. May repeat in 2 hours if headache persists or recurs.  9 tablet  3  . zolpidem (AMBIEN) 10 MG tablet Take 5 mg by mouth at bedtime as needed for sleep.       No current facility-administered medications for this visit.    OBJECTIVE: Middle-aged white woman in no  acute distress Filed Vitals:   08/21/13 1432  BP: 116/66  Pulse: 77  Temp: 98.7 F (37.1 C)  Resp: 20     Body mass index is 21.18 kg/(m^2).    ECOG FS: 0 Filed Weights   08/21/13 1432  Weight: 133 lb 3.2 oz (60.419 kg)   Physical Exam: HEENT:  Sclerae anicteric.  Oropharynx clear, pink, and moist.  Good dentition. Neck is supple, trachea midline. No thyromegaly.  NODES:  No cervical or supraclavicular lymphadenopathy palpated.  BREAST EXAM:  Right breast is status post lumpectomy, with no suspicious nodularities or skin changes. Left breast is benign. Breast tissue is dense to palpation bilaterally. Axillae are clear, with no palpable lymphadenopathy. LUNGS:  Clear to auscultation bilaterally.  No wheezes or rhonchi HEART:  Regular rate and rhythm. No murmur appreciated ABDOMEN:  Soft, nontender. No hepatomegaly palpated.  Positive bowel sounds.  MSK:  No focal spinal tenderness to palpation. Full range of motion bilaterally in the upper extremities. Patient does have a splint on the fourth digit of the left hand status post injury in July. There is no obvious swelling. EXTREMITIES:  No peripheral edema.   SKIN:  Benign with no visible rashes or skin lesions. No pallor. No ecchymoses or petechiae. NEURO:  Nonfocal. Well oriented.  Positive affect.    LAB RESULTS: Lab Results  Component Value Date   WBC 6.2 08/21/2013   NEUTROABS 3.9 08/21/2013   HGB 13.3 08/21/2013   HCT 40.5 08/21/2013   MCV 96.0 08/21/2013   PLT 264  08/21/2013      Chemistry      Component Value Date/Time   NA 138 08/21/2013 1525   NA 139 10/05/2011 1317   K 4.0 08/21/2013 1525   K 3.7 10/05/2011 1317   CL 98 03/21/2012 1053   CL 102 10/05/2011 1317   CO2 27 08/21/2013 1525   CO2 27 10/05/2011 1317   BUN 14.3 08/21/2013 1525   BUN 8 10/05/2011 1317   CREATININE 0.9 08/21/2013 1525   CREATININE 0.65 10/05/2011 1317      Component Value Date/Time   CALCIUM 9.4 08/21/2013 1525   CALCIUM 9.7 10/05/2011 1317   ALKPHOS 61 08/21/2013 1525   ALKPHOS 48 07/18/2013 1148   AST 16 08/21/2013 1525   AST 23 07/18/2013 1148   ALT 11 08/21/2013 1525   ALT 15 07/18/2013 1148   BILITOT 0.34 08/21/2013 1525   BILITOT 0.5 07/18/2013 1148        STUDIES:  Most recent bone density at [facility] on 05/13/2011 showed osteoporosis with a T score of -2.5. This is scheduled to be repeated in March 2015.  Bilateral mammogram at [facility] on 05/13/2012 was unremarkable. It did note the presence of heterogeneously dense breast tissue bilaterally.   ASSESSMENT: 68 y.o. 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  (2) status post  neoadjuvant chemotherapy, which consisted of docetaxel/ gemcitabine/ bevacizumab followed by doxorubicin/ cyclophosphamide/ bevacizumab as per the NSABP B-40 protocol.  Last bevacizumab dose was January of 2010.   (3) status-post right lumpectomy and sentinel lymph node dissection in April of 2010 showing a complete pathologic response   (4) She completed radiation July of 2010.   (5)  osteoporosis improved on the recent bone density scan on annual zoledronic acid and also daily calcium with vitamin D supplementation  PLAN:  Brittany [name]  is doing  very well. Her mammogram which was done in March 2015 revealed no evidence of any malignancy. I have reviewed her CBC and CMP in those are within normal range.  Will arrange for her next annual  mammogram in March 2016. Her bone density scan revealed improvement in the bone mass. Continue calcium with vitamin D supplementation. She is due to get her zoledronic acid in October 2015. She would like to continue being seen on a q. 6 month basis. In view of her myalgias I have ordered antinuclear antibodies, CK total along with CBC differential and CMP today.   She voices understanding and agreement with this plan. She knows to call with any changes or problems.   Wilmon Arms, M.D. Medical oncology   08/21/2013

## 2013-08-22 ENCOUNTER — Other Ambulatory Visit: Payer: Commercial Managed Care - HMO

## 2013-08-22 ENCOUNTER — Telehealth: Payer: Self-pay | Admitting: Oncology

## 2013-08-22 ENCOUNTER — Ambulatory Visit: Payer: Self-pay | Admitting: Oncology

## 2013-08-22 LAB — CK TOTAL AND CKMB (NOT AT ARMC)
CK TOTAL: 42 U/L (ref 7–177)
CK, MB: <0.7 ng/mL (ref 0.0–5.0)

## 2013-08-22 LAB — VITAMIN D 25 HYDROXY (VIT D DEFICIENCY, FRACTURES): Vit D, 25-Hydroxy: 67 ng/mL (ref 30–89)

## 2013-08-22 LAB — ANA: ANA: NEGATIVE

## 2013-09-04 ENCOUNTER — Encounter: Payer: Self-pay | Admitting: Internal Medicine

## 2013-09-10 ENCOUNTER — Other Ambulatory Visit: Payer: Self-pay | Admitting: Internal Medicine

## 2013-09-12 ENCOUNTER — Other Ambulatory Visit: Payer: BC Managed Care – PPO

## 2013-09-13 ENCOUNTER — Other Ambulatory Visit: Payer: Self-pay | Admitting: Internal Medicine

## 2013-09-18 ENCOUNTER — Other Ambulatory Visit: Payer: Self-pay | Admitting: Internal Medicine

## 2013-09-18 MED ORDER — PRAVASTATIN SODIUM 20 MG PO TABS
20.0000 mg | ORAL_TABLET | Freq: Every day | ORAL | Status: DC
Start: 2013-09-18 — End: 2013-09-20

## 2013-09-19 ENCOUNTER — Ambulatory Visit: Payer: BC Managed Care – PPO | Admitting: Oncology

## 2013-09-20 ENCOUNTER — Other Ambulatory Visit: Payer: Self-pay

## 2013-09-20 MED ORDER — PRAVASTATIN SODIUM 20 MG PO TABS: 20.0000 mg | ORAL_TABLET | Freq: Every day | ORAL | Status: DC

## 2013-10-02 ENCOUNTER — Telehealth: Payer: Self-pay | Admitting: *Deleted

## 2013-10-02 NOTE — Telephone Encounter (Signed)
OK to fill this prescription with additional refills x2 Thank you!  

## 2013-10-02 NOTE — Telephone Encounter (Signed)
Pt needs Rf on Zolpidem. Please advise.

## 2013-10-04 MED ORDER — ZOLPIDEM TARTRATE 10 MG PO TABS: 10.0000 mg | ORAL_TABLET | Freq: Every evening | ORAL | Status: DC | PRN

## 2013-10-04 NOTE — Telephone Encounter (Signed)
Done

## 2013-10-06 ENCOUNTER — Telehealth: Payer: Self-pay | Admitting: *Deleted

## 2013-10-06 ENCOUNTER — Other Ambulatory Visit: Payer: Self-pay | Admitting: *Deleted

## 2013-10-06 NOTE — Telephone Encounter (Signed)
Per POF staff message scheduled appts. Advised scheduler 

## 2013-10-09 ENCOUNTER — Telehealth: Payer: Self-pay | Admitting: Oncology

## 2013-10-12 ENCOUNTER — Telehealth: Payer: Self-pay | Admitting: *Deleted

## 2013-10-12 NOTE — Telephone Encounter (Signed)
Received vm call from pt stating that her insurance is not covering her zometa that she needs to get in Sept.  Returned call & she has changed insurance from [place] to [place] since last zometa.  Her ID # is [id number] & [employer] fax # is [phone].  This infor was given to Man[facility]/Elizabeth.

## 2013-10-31 ENCOUNTER — Other Ambulatory Visit: Payer: Self-pay | Admitting: *Deleted

## 2013-11-04 ENCOUNTER — Telehealth: Payer: Self-pay | Admitting: Oncology

## 2013-11-04 NOTE — Telephone Encounter (Signed)
Returned call to pt from VM-pt wanted to r/s infusion appt-trans to [phone]

## 2013-11-08 ENCOUNTER — Telehealth: Payer: Self-pay | Admitting: Internal Medicine

## 2013-11-08 ENCOUNTER — Other Ambulatory Visit: Payer: Self-pay | Admitting: Internal Medicine

## 2013-11-08 NOTE — Telephone Encounter (Signed)
Patient Information:  Caller Name: Wynette  Phone: [phone]  Patient: Brittany [name]  Gender: Female  DOB: 02/17/1946  Age: 68 Years  PCP: Plotnikov, Alex (Adults only)  Office Follow Up:  Does the office need to follow up with this patient?: No  Instructions For The Office: N/A  RN Note:  She is wanting to be rechecked before she goes out of town for travel in case she has reoccurance of sx and is needing a second round of antibiotics called in.  Symptoms  Reason For Call & Symptoms: Started with severe diarrhea on 11/04/13 after visiting daughter in the hospital.  She felt feverish on and off. Seen in UC on 11/06/13 and given specimin for stool sample and started on Metronidazole 500 mgs 1 PO BID x 7 days -she has had 4 doses so far and is feeling better today-11/08/13. She is still having some diarrhea but stool is started to have some consistancy. Appetite diminished but she is drinking plenty of water and eating yogurt. She is waiting on stool sample results and will continue antibiotic. Advised to get rechecked if sx do not continue to improve daily. She will call back for appointment.  Reviewed Health History In EMR: Yes  Reviewed Medications In EMR: Yes  Reviewed Allergies In EMR: Yes  Reviewed Surgeries / Procedures: Yes  Date of Onset of Symptoms: 11/04/2013  Treatments Tried: Metronidazole  Treatments Tried Worked: Yes  Guideline(s) Used:  Diarrhea  Disposition Per Guideline:   See Within 3 Days in Office  Reason For Disposition Reached:   Patient wants to be seen  Advice Given:  Reassurance:  In healthy adults, new-onset diarrhea is usually caused by a viral infection of the intestines, which you can treat at home. Diarrhea is the body's way of getting rid of the infection. Here are some tips on how to keep ahead of the fluid losses.  Fluids:  Drink more fluids, at least 8-10 glasses (8 oz or 240 ml) daily.  For example: sports drinks, diluted fruit juices, soft drinks.  Supplement this with saltine crackers or soups to make certain that you are getting sufficient fluid and salt to meet your body's needs.  Avoid caffeinated beverages (Reason: caffeine is mildly dehydrating).  Nutrition:  Maintaining some food intake during episodes of diarrhea is important.  Ideal initial foods include boiled starches/cereals (e.g., potatoes, rice, noodles, wheat, oats) with a small amount of salt to taste.  Other acceptable foods include: bananas, yogurt, crackers, soup.  As your stools return to normal consistency, resume a normal diet.  Expected Course:  Viral diarrhea lasts 4-7 days. Always worse on days 1 and 2.  Call Back If:  Signs of dehydration occur (e.g., no urine for more than 12 hours, very dry mouth, lightheaded, etc.)  Diarrhea lasts over 7 days  You become worse.  Patient Refused Recommendation:  Patient Will Follow Up With Office Later  She will call back on friday 11/10/13

## 2013-11-09 ENCOUNTER — Telehealth: Payer: Self-pay | Admitting: *Deleted

## 2013-11-09 NOTE — Telephone Encounter (Signed)
This RN returned call to pt per her inquiry about obtaining an appointment for zometa.  Obtained identified VM-message left informing pt request for appointment is being processed and she should be receiving a call tomorrow.  This RN's name and office number given for return call.

## 2013-11-10 ENCOUNTER — Telehealth: Payer: Self-pay | Admitting: *Deleted

## 2013-11-10 NOTE — Telephone Encounter (Signed)
Per desk RN I have called and left message for patient for appt next week.

## 2013-11-14 ENCOUNTER — Telehealth: Payer: Self-pay | Admitting: Emergency Medicine

## 2013-11-14 ENCOUNTER — Other Ambulatory Visit: Payer: Self-pay | Admitting: Emergency Medicine

## 2013-11-14 NOTE — Telephone Encounter (Signed)
Patient called stating that her ins doesn't cover Zometa; states that her ins covers Prolia under Part B (NOT Part D). Once a plan is made, patient would like a call to set up her next appointment for treatment.

## 2013-11-14 NOTE — Progress Notes (Signed)
Patient called stating that her ins will not cover Zometa. States they do cover Prolia under Part B (NOT Part D). Once determined what she will receive and which is approved, patient would like to be called to have appointment scheduled.

## 2013-11-15 ENCOUNTER — Ambulatory Visit: Payer: Commercial Managed Care - HMO

## 2013-11-16 ENCOUNTER — Telehealth: Payer: Self-pay | Admitting: Oncology

## 2013-11-16 ENCOUNTER — Other Ambulatory Visit: Payer: Self-pay | Admitting: Oncology

## 2013-11-16 NOTE — Telephone Encounter (Signed)
cld & left pt message for appt time & date

## 2013-11-30 ENCOUNTER — Ambulatory Visit (HOSPITAL_BASED_OUTPATIENT_CLINIC_OR_DEPARTMENT_OTHER): Payer: Commercial Managed Care - HMO

## 2013-11-30 VITALS — BP 108/62 | HR 68 | Temp 97.9°F

## 2013-11-30 DIAGNOSIS — M810 Age-related osteoporosis without current pathological fracture: Secondary | ICD-10-CM

## 2013-11-30 DIAGNOSIS — Z299 Encounter for prophylactic measures, unspecified: Secondary | ICD-10-CM

## 2013-11-30 DIAGNOSIS — Z23 Encounter for immunization: Secondary | ICD-10-CM

## 2013-11-30 MED ORDER — INFLUENZA VAC SPLIT QUAD 0.5 ML IM SUSY
0.5000 mL | PREFILLED_SYRINGE | Freq: Once | INTRAMUSCULAR | Status: AC
  Administered 2013-11-30: 0.5 mL via INTRAMUSCULAR
  Filled 2013-11-30: qty 0.5

## 2013-11-30 MED ORDER — DENOSUMAB 60 MG/ML ~~LOC~~ SOLN
60.0000 mg | Freq: Once | SUBCUTANEOUS | Status: AC
  Administered 2013-11-30: 60 mg via SUBCUTANEOUS
  Filled 2013-11-30: qty 1

## 2013-12-11 ENCOUNTER — Encounter: Payer: Self-pay | Admitting: Internal Medicine

## 2013-12-11 ENCOUNTER — Ambulatory Visit (INDEPENDENT_AMBULATORY_CARE_PROVIDER_SITE_OTHER): Payer: Commercial Managed Care - HMO | Admitting: Internal Medicine

## 2013-12-11 ENCOUNTER — Ambulatory Visit (INDEPENDENT_AMBULATORY_CARE_PROVIDER_SITE_OTHER)
Admission: RE | Admit: 2013-12-11 | Discharge: 2013-12-11 | Disposition: A | Discharge status: Home / Self Care | Payer: Commercial Managed Care - HMO | Source: Ambulatory Visit | Attending: Internal Medicine | Admitting: Internal Medicine

## 2013-12-11 VITALS — BP 98/68 | HR 77 | Temp 98.2°F | Resp 18 | Wt 127.2 lb

## 2013-12-11 DIAGNOSIS — J309 Allergic rhinitis, unspecified: Secondary | ICD-10-CM

## 2013-12-11 DIAGNOSIS — R059 Cough, unspecified: Secondary | ICD-10-CM

## 2013-12-11 DIAGNOSIS — H6981 Other specified disorders of Eustachian tube, right ear: Secondary | ICD-10-CM

## 2013-12-11 DIAGNOSIS — R05 Cough: Secondary | ICD-10-CM

## 2013-12-11 DIAGNOSIS — Z853 Personal history of malignant neoplasm of breast: Secondary | ICD-10-CM

## 2013-12-11 MED ORDER — MONTELUKAST SODIUM 10 MG PO TABS: 10.0000 mg | ORAL_TABLET | Freq: Every day | ORAL | Status: DC

## 2013-12-11 NOTE — Progress Notes (Signed)
Pre visit review using our clinic review tool, if applicable. No additional management support is needed unless otherwise documented below in the visit note. 

## 2013-12-11 NOTE — Patient Instructions (Signed)
Go to [facility] for eustachian tube dysfunction. Drink thin  fluids liberally through the day and chew sugarless gum . Do the Valsalva maneuver several times a day to "pop" ears open. Flonase 1 spray in each nostril twice a day as needed. Use the "crossover" technique as discussed. Use a Neti pot daily- 2x /day  as needed for sinus congestion    Plain Mucinex (NOT D) for thick secretions ;force NON dairy fluids .   Use a Neti pot daily only  as needed for significant sinus congestion; going from open side to congested side . Plain Allegra (NOT D )  160 daily , Loratidine 10 mg , OR Zyrtec 10 mg @ bedtime  as needed for itchy eyes & sneezing.

## 2013-12-11 NOTE — Progress Notes (Signed)
   Subjective:    Patient ID: Brittany [name], female    DOB: January 21, 1946, 68 y.o.   MRN: [id number]  HPI   She describes chronic cough or dyspnea or which is nonproductive and intermittent for > 12 months. It has awakened her  at times  It is increasing in frequency and severity over the last 2-3 months  She did smoke for 3 years roughly 40 years ago.  Her last x-ray was in October 2013. This showed some pleural thickening in the right apex. Flattened diaphragms were present. The cardiac silhouette appeared to be small. There was marked increase in AP diameter and retrosternal air space. These findings were not described in the official report.  Her past medical history is positive for breast cancer, stage I, triple negative. She's also had esophageal stricture. At this time she is asymptomatic on an oral H2 blockade twice a day.  She also has a history of allergic rhinitis; she describes some pressure in right ear as well as some mild allergic symptoms for last 2 weeks. The progression of cough preceded this exacerbation.  Her father did have a history of lung cancer and was a nonsmoker.     Review of Systems Frontal headache, facial pain , nasal purulence, dental pain, sore throat , otic pain or otic discharge denied. No fever , chills or sweats.Extrinsic symptoms of itchy, watery eyes, sneezing, or angioedema are denied. There is no significant  sputum production, wheezing,or  paroxysmal nocturnal dyspnea. No GERD symptoms.     Objective:   Physical Exam General appearance:good health ;well nourished; no acute distress or increased work of breathing is present.  No  lymphadenopathy about the head, neck, or axilla noted.   Eyes: No conjunctival inflammation or lid edema is present. There is no scleral icterus.  Ears:  External ear exam shows no significant lesions or deformities.  Wax on L. R TM WNL without bulging, retraction, inflammation or discharge.  Nose:  External nasal  examination shows no deformity or inflammation. Nasal mucosa are pink and moist without lesions or exudates. No septal dislocation or deviation.No obstruction to airflow.   Oral exam: Dental hygiene is good; lips and gums are healthy appearing.There is no oropharyngeal erythema or exudate noted.   Neck:  No deformities, thyromegaly, masses, or tenderness noted.   Supple with full range of motion without pain.   Heart:  Normal rate and regular rhythm. S1 and S2 normal without gallop, murmur, click, rub or other extra sounds.   Lungs:Chest clear to auscultation; no wheezes, rhonchi,rales ,or rubs present.No increased work of breathing.    Extremities:  No cyanosis, edema, or clubbing  noted    Skin: Warm & dry w/o jaundice or tenting.          Assessment & Plan:  #1 cough, intermittent for the> 12 mos #2 R Eustachian tube dysfunction See orders

## 2013-12-25 ENCOUNTER — Telehealth: Payer: Self-pay | Admitting: Obstetrics and Gynecology

## 2013-12-25 NOTE — Telephone Encounter (Signed)
Left message upcoming appointment has been canceled and needs to be rescheduled. °

## 2013-12-26 NOTE — Telephone Encounter (Signed)
Appointment rescheduled.

## 2014-01-01 ENCOUNTER — Encounter: Payer: Self-pay | Admitting: Internal Medicine

## 2014-02-09 DIAGNOSIS — R079 Chest pain, unspecified: Secondary | ICD-10-CM | POA: Insufficient documentation

## 2014-02-09 DIAGNOSIS — E785 Hyperlipidemia, unspecified: Secondary | ICD-10-CM | POA: Insufficient documentation

## 2014-02-18 ENCOUNTER — Other Ambulatory Visit: Payer: Self-pay | Admitting: Internal Medicine

## 2014-02-18 DIAGNOSIS — R079 Chest pain, unspecified: Secondary | ICD-10-CM

## 2014-02-27 ENCOUNTER — Encounter: Payer: Self-pay | Admitting: Internal Medicine

## 2014-03-06 ENCOUNTER — Ambulatory Visit (INDEPENDENT_AMBULATORY_CARE_PROVIDER_SITE_OTHER): Payer: Commercial Managed Care - HMO | Admitting: Internal Medicine

## 2014-03-06 ENCOUNTER — Encounter: Payer: Self-pay | Admitting: Internal Medicine

## 2014-03-06 VITALS — BP 100/72 | HR 63 | Temp 97.9°F | Ht 66.5 in | Wt 128.0 lb

## 2014-03-06 DIAGNOSIS — N3000 Acute cystitis without hematuria: Secondary | ICD-10-CM | POA: Insufficient documentation

## 2014-03-06 DIAGNOSIS — Z853 Personal history of malignant neoplasm of breast: Secondary | ICD-10-CM

## 2014-03-06 DIAGNOSIS — R300 Dysuria: Secondary | ICD-10-CM | POA: Insufficient documentation

## 2014-03-06 HISTORY — DX: Acute cystitis without hematuria: N30.00

## 2014-03-06 LAB — POCT URINALYSIS DIPSTICK
Bilirubin, UA: NEGATIVE
Glucose, UA: NEGATIVE
Ketones, UA: NEGATIVE
Nitrite, UA: NEGATIVE
PROTEIN UA: 15
RBC UA: NEGATIVE
Spec Grav, UA: 1.02
UROBILINOGEN UA: 0.2
pH, UA: 6

## 2014-03-06 MED ORDER — CIPROFLOXACIN HCL 250 MG PO TABS: 250.0000 mg | ORAL_TABLET | Freq: Two times a day (BID) | ORAL | Status: DC

## 2014-03-06 NOTE — Progress Notes (Signed)
   Subjective:    Patient ID: Brittany [name], female    DOB: [birth year], 69 y.o.   MRN: [id number]  Dysuria  This is a new problem. The current episode started in the past 7 days. The problem occurs every urination. The problem has been unchanged. The quality of the pain is described as burning. The pain is at a severity of 1/10. The patient is experiencing no pain. There has been no fever. The fever has been present for less than 1 day. She is sexually active. There is no history of pyelonephritis. Associated symptoms include frequency and urgency. Pertinent negatives include no chills, discharge, flank pain, hematuria, hesitancy, nausea, sweats or vomiting. She has tried nothing for the symptoms. The treatment provided no relief. There is no history of catheterization, kidney stones, recurrent UTIs, a single kidney, urinary stasis or a urological procedure.      Review of Systems  Constitutional: Negative.  Negative for fever, chills, diaphoresis, appetite change and fatigue.  HENT: Negative.   Eyes: Negative.   Respiratory: Negative.  Negative for cough, choking, chest tightness, shortness of breath and stridor.   Cardiovascular: Negative.  Negative for chest pain, palpitations and leg swelling.  Gastrointestinal: Negative.  Negative for nausea, vomiting and abdominal pain.  Endocrine: Negative.   Genitourinary: Positive for dysuria, urgency and frequency. Negative for hesitancy, hematuria, flank pain, decreased urine volume and difficulty urinating.  Musculoskeletal: Negative.   Skin: Negative.  Negative for rash.  Allergic/Immunologic: Negative.   Neurological: Negative.   Hematological: Negative.  Negative for adenopathy. Does not bruise/bleed easily.  Psychiatric/Behavioral: Negative.        Objective:   Physical Exam  Constitutional: She is oriented to person, place, and time. She appears well-developed and well-nourished.  Non-toxic appearance. She does not have a sickly  appearance. She does not appear ill. No distress.  HENT:  Head: Normocephalic and atraumatic.  Mouth/Throat: Oropharynx is clear and moist. No oropharyngeal exudate.  Eyes: Conjunctivae are normal. Right eye exhibits no discharge. Left eye exhibits no discharge. No scleral icterus.  Neck: Normal range of motion. Neck supple. No JVD present. No tracheal deviation present. No thyromegaly present.  Cardiovascular: Normal rate, regular rhythm, normal heart sounds and intact distal pulses.  Exam reveals no gallop and no friction rub.   No murmur heard. Pulmonary/Chest: Effort normal and breath sounds normal. No stridor. No respiratory distress. She has no wheezes. She has no rales. She exhibits no tenderness.  Abdominal: Soft. Normal appearance and bowel sounds are normal. She exhibits no distension and no mass. There is no hepatosplenomegaly. There is no tenderness. There is no rebound, no guarding and no CVA tenderness.  Musculoskeletal: Normal range of motion. She exhibits no edema or tenderness.  Lymphadenopathy:    She has no cervical adenopathy.  Neurological: She is oriented to person, place, and time.  Skin: Skin is warm and dry. No rash noted. She is not diaphoretic. No erythema. No pallor.  Vitals reviewed.         Assessment & Plan:

## 2014-03-06 NOTE — Assessment & Plan Note (Signed)
She needs an updated referral to oncology

## 2014-03-06 NOTE — Assessment & Plan Note (Signed)
UA is +++ for leukocytes Will treat with cipro

## 2014-03-06 NOTE — Patient Instructions (Signed)

## 2014-03-09 ENCOUNTER — Other Ambulatory Visit: Payer: Self-pay | Admitting: *Deleted

## 2014-03-09 DIAGNOSIS — C50919 Malignant neoplasm of unspecified site of unspecified female breast: Secondary | ICD-10-CM

## 2014-03-12 ENCOUNTER — Other Ambulatory Visit (HOSPITAL_BASED_OUTPATIENT_CLINIC_OR_DEPARTMENT_OTHER): Payer: Commercial Managed Care - HMO

## 2014-03-12 ENCOUNTER — Ambulatory Visit (HOSPITAL_BASED_OUTPATIENT_CLINIC_OR_DEPARTMENT_OTHER): Payer: Commercial Managed Care - HMO | Admitting: Oncology

## 2014-03-12 ENCOUNTER — Other Ambulatory Visit: Payer: Self-pay | Admitting: Emergency Medicine

## 2014-03-12 ENCOUNTER — Telehealth: Payer: Self-pay | Admitting: Oncology

## 2014-03-12 VITALS — BP 118/64 | HR 64 | Temp 98.7°F | Resp 18 | Ht 66.5 in | Wt 128.6 lb

## 2014-03-12 DIAGNOSIS — Z853 Personal history of malignant neoplasm of breast: Secondary | ICD-10-CM

## 2014-03-12 DIAGNOSIS — M949 Disorder of cartilage, unspecified: Secondary | ICD-10-CM

## 2014-03-12 DIAGNOSIS — C50919 Malignant neoplasm of unspecified site of unspecified female breast: Secondary | ICD-10-CM

## 2014-03-12 DIAGNOSIS — M899 Disorder of bone, unspecified: Secondary | ICD-10-CM

## 2014-03-12 DIAGNOSIS — C50911 Malignant neoplasm of unspecified site of right female breast: Secondary | ICD-10-CM | POA: Insufficient documentation

## 2014-03-12 LAB — CBC WITH DIFFERENTIAL/PLATELET
BASO%: 0.7 % (ref 0.0–2.0)
BASOS ABS: 0 10*3/uL (ref 0.0–0.1)
EOS%: 2.4 % (ref 0.0–7.0)
Eosinophils Absolute: 0.2 10*3/uL (ref 0.0–0.5)
HEMATOCRIT: 41.3 % (ref 34.8–46.6)
HGB: 13.6 g/dL (ref 11.6–15.9)
LYMPH#: 1.6 10*3/uL (ref 0.9–3.3)
LYMPH%: 25 % (ref 14.0–49.7)
MCH: 30.9 pg (ref 25.1–34.0)
MCHC: 32.8 g/dL (ref 31.5–36.0)
MCV: 94.2 fL (ref 79.5–101.0)
MONO#: 0.5 10*3/uL (ref 0.1–0.9)
MONO%: 8.3 % (ref 0.0–14.0)
NEUT%: 63.6 % (ref 38.4–76.8)
NEUTROS ABS: 4.2 10*3/uL (ref 1.5–6.5)
PLATELETS: 292 10*3/uL (ref 145–400)
RBC: 4.39 10*6/uL (ref 3.70–5.45)
RDW: 13.6 % (ref 11.2–14.5)
WBC: 6.6 10*3/uL (ref 3.9–10.3)

## 2014-03-12 LAB — COMPREHENSIVE METABOLIC PANEL (CC13)
ALK PHOS: 59 U/L (ref 40–150)
ALT: 13 U/L (ref 0–55)
AST: 16 U/L (ref 5–34)
Albumin: 4 g/dL (ref 3.5–5.0)
Anion Gap: 5 mEq/L (ref 3–11)
BUN: 10.7 mg/dL (ref 7.0–26.0)
CALCIUM: 9 mg/dL (ref 8.4–10.4)
CO2: 26 meq/L (ref 22–29)
CREATININE: 0.7 mg/dL (ref 0.6–1.1)
Chloride: 106 mEq/L (ref 98–109)
GLUCOSE: 95 mg/dL (ref 70–140)
Potassium: 3.7 mEq/L (ref 3.5–5.1)
Sodium: 137 mEq/L (ref 136–145)
Total Bilirubin: 0.32 mg/dL (ref 0.20–1.20)
Total Protein: 6.9 g/dL (ref 6.4–8.3)

## 2014-03-12 NOTE — Progress Notes (Signed)
ID: Brittany [name]   DOB: [birth year]  MR#: [id number]  OEU#:[id number]   PCP:  Brittany Kehr, MD GYN: SURGEON:  Brittany Mc, MD [place]:  Brittany Silversmith, MD OTHER:  Brittany Lyons, MD  CHIEF COMPLAINT:  Triple negative Right Breast Cancer; osteoporosis  CURRENT TREATMENT: Observation; denosumab  HISTORY OF PRESENT ILLNESS: From original  intake note:  The patient noted a mass in her right breast and immediately brought it to Dr. Gareth [name] attention 11/22/2007.  I should note that the patient had a negative routine screening mammogram at [facility] on 07/19/2007.  In the 11/22/2007 study, there was at least a 1.5-cm ill-defined density seen primarily on the right MLO view.  There were a few associated microcalcifications.  By palpation, Dr. Isaiah [name] felt this was approximately 1.9 cm.  By ultrasound, this was an irregular hypoechoic mass measuring up to 1.9 cm.  There were a few irregular vessels present by Doppler.  There were no abnormal lymph nodes noted.  Breast specific gamma imaging was performed the next day, 11/23/2007, and showed a normal left breast.  On the right, there was a 1.6-cm high-density focus of abnormal isotope activity.  Biopsy of the mass was performed the same day under ultrasound guidance, and showed ([id number] and [id number]) an intermediate to high-grade invasive ductal carcinoma, which was ER and PR negative with a proliferation marker of 50%.  HER2-neu was negative at 1+. Her subsequent history is as detailed below.  INTERVAL HISTORY: Brittany [name] returns today for follow-up of her triple negative breast cancer. The interval history is significant for her dog having died earlier today she and her husband are very sad. This was a family member for the last 57 years. At this point she is not sure she will ever want another dog. She knows that if she wishes to foster or adopt a poppy from the pound I have a pT2 can facilitate that for her  REVIEW OF SYSTEMS: Brittany [name]  goes to the gym ideally 3 times a week but frequently once a week. She recently had a urinary tract infection which Brittany [name] cough is treating with Cipro. Otherwise a detailed review of systems today was entirely benign  PAST MEDICAL HISTORY: Past Medical History  Diagnosis Date  . Cancer     breast right  triple recet neg, lumpect, chemo, XRT  . Breast cancer 11/23/2007    right, ER/PR -, HER 2 -  . History of radiation therapy 07/25/08 -09/11/08    right breast  . Osteopenia   . Esophageal stricture   . Gastritis 10/2007  . Migraines     rare  . History of chemotherapy     neoadjuvant chemo, last dose 03/2008  Significant for osteopenia, history of hiatal hernia with esophageal stricture, status post upper endoscopy under Brittany [name] 10/10/2007 showing an erosive gastritis in a background of reactive gastropathy ([id number]).  She has a history of rare migraines, and when she was 4, she had a benign growth removed from the back of her right calf.    PAST SURGICAL HISTORY: Past Surgical History  Procedure Laterality Date  . Breast lumpectomy  06/26/2008    right, CA   XRT, chemo    FAMILY HISTORY Family History  Problem Relation Age of Onset  . Heart disease Father   . Cancer Father     lung  . Cancer Sister 36    Breast & Colorectal  . Epilepsy Mother   The patient's father died from lung cancer at  the age of 63; he was not a smoker.  The patient's mother is alive at age 75; she has epilepsy.  The patient has a sister diagnosed with breast cancer when she was 49.  She is doing well.  The patient has a brother in good health.  There is no other history of breast or ovarian cancer in the family.  GYNECOLOGIC HISTORY: She is GX P3, first pregnancy to term age 7, last menstrual period when she was 69 years old.  She never took any hormones.     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  Their son, Brittany [name], works for [employer], an Automotive engineer, and is working for his [employer]; he has 2 children and lives in [place], right outside of [place].  They have a daughter, Brittany [name], who lives in [place] and has 3 children, and a son, Brittany [name], who just married a young woman from [place] and is now living in [place]  The patient attends the Brittany [name].    ADVANCED DIRECTIVES:  HEALTH MAINTENANCE:  (Updated January 2015) History  Substance Use Topics  . Smoking status: Former Smoker -- 5 years    Types: Cigarettes    Quit date: 03/03/1967  . Smokeless tobacco: Never Used     Comment: 6 cig per day when she was smoking/quit years ago  . Alcohol Use: No     Comment: 1 beer     Colonoscopy:  April 2007/Brittany [name]  PAP: Not on file  Bone density: March 2015 with a T score of -2.3  Lipid panel: January 2015/Brittany [name]   Allergies  Allergen Reactions  . Ibandronate Sodium     REACTION: intolerance--BONIVA  . Lipitor [Atorvastatin]     arthralgias    Current Outpatient Prescriptions  Medication Sig Dispense Refill  . ASPIRIN PO Take 81 mg by mouth daily.     [name] BIOTIN PO Take by mouth daily.    [name] CALCIUM CITRATE-VITAMIN D PO     . Cholecalciferol (VITAMIN D-3 PO) Take 1 tablet by mouth daily.     . ciprofloxacin (CIPRO) 250 MG tablet Take 1 tablet (250 mg total) by mouth 2 (two) times daily. 10 tablet 1  . citalopram (CELEXA) 40 MG tablet Take 1 tablet (40 mg total) by mouth daily. 90 tablet 3  . Cyanocobalamin (VITAMIN B-12 CR PO) Take by mouth.    . diphenhydrAMINE (BENADRYL) 25 MG tablet Take 25 mg by mouth every 6 (six) hours as needed.    . montelukast (SINGULAIR) 10 MG tablet Take 1 tablet (10 mg total) by mouth at bedtime. 30 tablet 5  . Omega-3 Fatty Acids (FISH OIL) 1000 MG CAPS Take 1 capsule by mouth daily.      . pravastatin (PRAVACHOL) 20 MG tablet Take 1  tablet (20 mg total) by mouth daily. 90 tablet 3  . ranitidine (ZANTAC) 150 MG tablet TAKE 1 TABLET TWICE DAILY 180 tablet 1  . SUMAtriptan (IMITREX) 100 MG tablet Take 1 tablet (100 mg total) by mouth once. May repeat in 2 hours if headache persists or recurs. 9 tablet 3  . zolpidem (AMBIEN) 10 MG tablet Take 1 tablet (10 mg total) by mouth at bedtime as needed for sleep. 90 tablet 2   No current facility-administered medications for this visit.    OBJECTIVE: Middle-aged white  woman who appears well Filed Vitals:   03/12/14 1540  BP: 118/64  Pulse: 64  Temp: 98.7 F (37.1 C)  Resp: 18     Body mass index is 20.45 kg/(m^2).    ECOG FS: 0 Filed Weights   03/12/14 1540  Weight: 128 lb 9.6 oz (58.333 kg)   Sclerae unicteric, pupils equal and reactive Oropharynx clear, dentition in good repair No cervical or supraclavicular adenopathy Lungs no rales or rhonchi Heart regular rate and rhythm Abd soft, nontender, positive bowel sounds MSK no focal spinal tenderness, no upper extremity lymphedema Neuro: nonfocal, well oriented, appropriate affect Breasts: The right breast is status post lumpectomy and radiation. There is no evidence of local recurrence. The right axilla is benign. The left breast is unremarkable.     LAB RESULTS: Lab Results  Component Value Date   WBC 6.6 03/12/2014   NEUTROABS 4.2 03/12/2014   HGB 13.6 03/12/2014   HCT 41.3 03/12/2014   MCV 94.2 03/12/2014   PLT 292 03/12/2014      Chemistry      Component Value Date/Time   NA 138 08/21/2013 1525   NA 139 10/05/2011 1317   K 4.0 08/21/2013 1525   K 3.7 10/05/2011 1317   CL 98 03/21/2012 1053   CL 102 10/05/2011 1317   CO2 27 08/21/2013 1525   CO2 27 10/05/2011 1317   BUN 14.3 08/21/2013 1525   BUN 8 10/05/2011 1317   CREATININE 0.9 08/21/2013 1525   CREATININE 0.65 10/05/2011 1317      Component Value Date/Time   CALCIUM 9.4 08/21/2013 1525   CALCIUM 9.7 10/05/2011 1317   ALKPHOS 61  08/21/2013 1525   ALKPHOS 48 07/18/2013 1148   AST 16 08/21/2013 1525   AST 23 07/18/2013 1148   ALT 11 08/21/2013 1525   ALT 15 07/18/2013 1148   BILITOT 0.34 08/21/2013 1525   BILITOT 0.5 07/18/2013 1148        STUDIES: No results found.   Mammogram scheduled for March 2016; next bone density March 2017   ASSESSMENT: 69 y.o. [place] woman   (1) s/p right breast bopsy 11/2007 for a clinical  T1c N0, stage IA Invasive ductal carcinoma, grade 2-3, triple-negative with an MIB-1 of 50%.  (2) neoadjuvant chemotherapy consisted of docetaxel/ gemcitabine/ bevacizumab followed by doxorubicin/ cyclophosphamide/ bevacizumab as per the NSABP B-40 protocol.  Last bevacizumab dose was January of 2010.   (3) status-post right lumpectomy and sentinel lymph node dissection in April of 2010 showing a complete pathologic response   (4) She completed adjuvant radiation July 2010.   (5)  Osteoporosis: on vit D/ calcium; received zolendronate  OCT 2013 and SEPT 2014, started prolia/ denosumab 11/30/2013, to be repeated every 6 months  (a) dexa scan 05/15/2013 showed a T score of -2/3 (improved)  PLAN:  Brittany [name] is almost 6 years out from her definitive surgery with no evidence of disease recurrence. This is very favorable, as triple negative breast cancers if they are going to recur 10 to do so early.  She is benefiting from denosumab which she will be receiving every April and October at least until the time of her next bone density, which will be March 2017. She will see [place] again in October of this year and then again in April of next year.   I think she would be an excellent candidate for our survivorship clinic. I have brought this to the attention of our survivorship clinic coordinator and she will contact the  patient.  Brittany [name] has a good understanding of the overall plan. She agrees with it. She knows to call for any problems that may develop before her next visit here.     Chauncey Cruel, MD     03/12/2014

## 2014-03-12 NOTE — Telephone Encounter (Signed)
per pof to sch pt appt-gave pt copy of sch-per pof to see GM on 4/17-sch not open-will print & sch & a later date

## 2014-03-15 ENCOUNTER — Telehealth: Payer: Self-pay | Admitting: Adult Health

## 2014-03-15 NOTE — Telephone Encounter (Signed)
I left a message for Ms. Gilkeson to call me to schedule her visit in the Survivorship Clinic per Dr. [name] request.  I would like to see her in April 2016 before her previously scheduled Prolia injection on 06/11/2014 at 2pm.    Waiting for her to return my call and schedule appointment.  Mike Craze, NP [place] [phone]

## 2014-03-16 ENCOUNTER — Telehealth: Payer: Self-pay | Admitting: Adult Health

## 2014-03-16 NOTE — Telephone Encounter (Signed)
I had a voicemail from Ms. Risinger returning my call to schedule her Survivorship Clinic visit.   I called and spoke with Ms. Hogston and she is now scheduled to see me for her initial survivorship visit on 06/11/2014 at 3:30pm, directly after her Prolia injection (scheduled for 2pm).    I look forward to meeting Ms. Desrosier and participating in her care.   Mike Craze, NP [place] [phone]

## 2014-03-23 ENCOUNTER — Institutional Professional Consult (permissible substitution): Payer: Commercial Managed Care - HMO | Admitting: Interventional Cardiology

## 2014-05-08 ENCOUNTER — Encounter: Payer: Self-pay | Admitting: Interventional Cardiology

## 2014-05-08 ENCOUNTER — Ambulatory Visit (INDEPENDENT_AMBULATORY_CARE_PROVIDER_SITE_OTHER): Payer: Commercial Managed Care - HMO | Admitting: Interventional Cardiology

## 2014-05-08 VITALS — BP 112/68 | HR 68 | Ht 66.0 in | Wt 126.0 lb

## 2014-05-08 DIAGNOSIS — I341 Nonrheumatic mitral (valve) prolapse: Secondary | ICD-10-CM

## 2014-05-08 DIAGNOSIS — I340 Nonrheumatic mitral (valve) insufficiency: Secondary | ICD-10-CM

## 2014-05-08 DIAGNOSIS — E785 Hyperlipidemia, unspecified: Secondary | ICD-10-CM

## 2014-05-08 DIAGNOSIS — R079 Chest pain, unspecified: Secondary | ICD-10-CM

## 2014-05-08 LAB — LIPID PANEL
CHOLESTEROL: 156 mg/dL (ref 0–200)
HDL: 58.6 mg/dL (ref >39.00)
LDL CALC: 85 mg/dL (ref 0–99)
NonHDL: 97.4
TRIGLYCERIDES: 61 mg/dL (ref 0.0–149.0)
Total CHOL/HDL Ratio: 3
VLDL: 12.2 mg/dL (ref 0.0–40.0)

## 2014-05-08 NOTE — Progress Notes (Signed)
Patient ID: Brittany [name], female   DOB: [birth year], 69 y.o.   MRN: [id number]     Patient ID: Brittany [name] MRN: [id number] DOB/AGE: 08/18/1945 69 y.o.   Referring Physician Dr. Alain Marion   Reason for Consultation chest discomfort  HPI: 69 y/o who was in [place] in 12/15.  She had eaten a typical dinner without any spicy food.  She took her usual dose of Zantac.  She woke up in the middle of the night with crushing, 9/10 chest pain.  Since she was home watching kids , she did not call EMS.  She walked around and eventually the pain went away,  She thinks it lasted several minutes but is unsure of how long.  She went to Urgent care and then was sent to the ER.  She ruled out for MI and then was sent to a cardiologist.  She had a stress echo which was negative for ischemia.  SHe had mild MVP and mild MR.    She is here to f/u on these findings.   Current Outpatient Prescriptions  Medication Sig Dispense Refill  . sulfamethoxazole-trimethoprim (BACTRIM DS,SEPTRA DS) 800-160 MG per tablet Take 1 tablet by mouth as directed.    . ASPIRIN PO Take 81 mg by mouth daily.     [name] BIOTIN PO Take by mouth daily.    [name] CALCIUM CITRATE-VITAMIN D PO     . Cholecalciferol (VITAMIN D-3 PO) Take 1 tablet by mouth daily.     . citalopram (CELEXA) 40 MG tablet Take 1 tablet (40 mg total) by mouth daily. 90 tablet 3  . Cyanocobalamin (VITAMIN B-12 CR PO) Take by mouth.    . diphenhydrAMINE (BENADRYL) 25 MG tablet Take 25 mg by mouth every 6 (six) hours as needed.    . Omega-3 Fatty Acids (FISH OIL) 1000 MG CAPS Take 1 capsule by mouth daily.      . pravastatin (PRAVACHOL) 20 MG tablet Take 1 tablet (20 mg total) by mouth daily. 90 tablet 3  . ranitidine (ZANTAC) 150 MG tablet TAKE 1 TABLET TWICE DAILY 180 tablet 1  . SUMAtriptan (IMITREX) 100 MG tablet Take 1 tablet (100 mg total) by mouth once. May repeat in 2 hours if headache persists or recurs. 9 tablet 3  . zolpidem (AMBIEN) 10 MG tablet Take 1 tablet (10 mg  total) by mouth at bedtime as needed for sleep. 90 tablet 2   No current facility-administered medications for this visit.   Past Medical History  Diagnosis Date  . Cancer     breast right  triple recet neg, lumpect, chemo, XRT  . Breast cancer 11/23/2007    right, ER/PR -, HER 2 -  . History of radiation therapy 07/25/08 -09/11/08    right breast  . Osteopenia   . Esophageal stricture   . Gastritis 10/2007  . Migraines     rare  . History of chemotherapy     neoadjuvant chemo, last dose 03/2008  . Hyperlipidemia   . Acid reflux     Family History  Problem Relation Age of Onset  . Heart disease Father   . Cancer Father     lung  . Cancer Sister 80    Breast & Colorectal  . Epilepsy Mother   . Dementia Mother     History   Social History  . Marital Status: Married    Spouse Name: N/A  . Number of Children: 3  . Years of Education: N/A   Occupational  History  .     Social History Main Topics  . Smoking status: Former Smoker -- 5 years    Types: Cigarettes    Quit date: 03/03/1967  . Smokeless tobacco: Never Used     Comment: 6 cig per day when she was smoking/quit years ago  . Alcohol Use: No     Comment: 1 beer  . Drug Use: No  . Sexual Activity: Yes     Comment: G3, P3, 1st pregnancy age 31, menopause age 25, no HRT   Other Topics Concern  . Not on file   Social History Narrative    Past Surgical History  Procedure Laterality Date  . Breast lumpectomy  06/26/2008    right, CA   XRT, chemo      (Not in a hospital admission)  Review of systems complete and found to be negative unless listed above .  No nausea, vomiting.  No fever chills, No focal weakness,  No palpitations.  Physical Exam: Filed Vitals:   05/08/14 0924  BP: 112/68  Pulse: 68    Weight: 126 lb (57.153 kg)  Physical exam:  [place]/AT EOMI No JVD, No carotid bruit RRR S1S2  No wheezing Soft. NT, nondistended No edema. No focal motor or sensory deficits Normal affect  Labs:     Lab Results  Component Value Date   WBC 6.6 03/12/2014   HGB 13.6 03/12/2014   HCT 41.3 03/12/2014   MCV 94.2 03/12/2014   PLT 292 03/12/2014   No results for input(s): NA, K, CL, CO2, BUN, CREATININE, CALCIUM, PROT, BILITOT, ALKPHOS, ALT, AST, GLUCOSE in the last 168 hours.  Invalid input(s): LABALBU Lab Results  Component Value Date   CKTOTAL 42 08/21/2013   CKMB <0.7 08/21/2013    Lab Results  Component Value Date   CHOL 159 07/18/2013   CHOL 245* 03/23/2013   CHOL 232* 11/11/2010   Lab Results  Component Value Date   HDL 60.40 07/18/2013   HDL 59.60 03/23/2013   HDL 64.20 11/11/2010   Lab Results  Component Value Date   LDLCALC 90 07/18/2013   Lab Results  Component Value Date   TRIG 45.0 07/18/2013   TRIG 53.0 03/23/2013   TRIG 39.0 11/11/2010   Lab Results  Component Value Date   CHOLHDL 3 07/18/2013   CHOLHDL 4 03/23/2013   CHOLHDL 4 11/11/2010   Lab Results  Component Value Date   LDLDIRECT 178.9 03/23/2013   LDLDIRECT 163.5 11/11/2010   LDLDIRECT 146.4 10/09/2009      Radiology: stress echo as above EKG: not in records from [facility] and the patient declined  ASSESSMENT AND PLAN:  1) Chest pain: negative stress echo.  No further sx.  Explained that cath would be next step in evaluation if she had further sx.  Since she is not having sx, will hold off on any further testing. She will let us know if she has any further symptoms. She continues to exercise regularly at the gym without any difficulty.   Unclear etiology of her symptoms in December. May have been GI related or musculoskeletal. It does not appear that it was cardiac.  2) mitral valve prolapse. Described as mild on the echocardiogram in 2015 December, with mild mitral regurgitation. Will repeat echocardiogram in 2 years.  3) hyperlipidemia: She did not tolerate Lipitor. She was switched to pravastatin. She thinks she has been on pravastatin for about 6 months but has not had her  cholesterol checked. She is fasting  today. We will check her cholesterol today.  Signed:   Mina Marble, MD, [facility] 05/08/2014, 9:43 AM

## 2014-05-08 NOTE — Patient Instructions (Signed)
Your physician has requested that you have an echocardiogram in 2 years. Echocardiography is a painless test that uses sound waves to create images of your heart. It provides your doctor with information about the size and shape of your heart and how well your heart's chambers and valves are working. This procedure takes approximately one hour. There are no restrictions for this procedure.  Your physician recommends that you have lab work today (Lipids)  Your physician recommends that you schedule a follow-up appointment as needed with Dr. Irish Lack.

## 2014-05-09 ENCOUNTER — Telehealth: Payer: Self-pay | Admitting: Interventional Cardiology

## 2014-05-09 NOTE — Telephone Encounter (Signed)
Follow Up ° °Pt returned call/sr  °

## 2014-05-09 NOTE — Telephone Encounter (Signed)
I spoke with the patient regarding her lab results.  

## 2014-05-10 ENCOUNTER — Encounter: Payer: Self-pay | Admitting: Internal Medicine

## 2014-05-10 ENCOUNTER — Other Ambulatory Visit: Payer: Self-pay | Admitting: Internal Medicine

## 2014-05-10 DIAGNOSIS — Z1239 Encounter for other screening for malignant neoplasm of breast: Secondary | ICD-10-CM

## 2014-05-10 DIAGNOSIS — Z01419 Encounter for gynecological examination (general) (routine) without abnormal findings: Secondary | ICD-10-CM

## 2014-05-21 ENCOUNTER — Other Ambulatory Visit: Payer: Commercial Managed Care - HMO

## 2014-05-21 ENCOUNTER — Ambulatory Visit: Payer: Self-pay | Admitting: Obstetrics and Gynecology

## 2014-05-21 ENCOUNTER — Encounter: Payer: Self-pay | Admitting: Family

## 2014-05-21 ENCOUNTER — Ambulatory Visit (INDEPENDENT_AMBULATORY_CARE_PROVIDER_SITE_OTHER): Payer: Commercial Managed Care - HMO | Admitting: Family

## 2014-05-21 VITALS — BP 122/80 | HR 66 | Temp 97.5°F | Resp 18 | Wt 126.0 lb

## 2014-05-21 DIAGNOSIS — R350 Frequency of micturition: Secondary | ICD-10-CM

## 2014-05-21 LAB — POCT URINALYSIS DIPSTICK
BILIRUBIN UA: NEGATIVE
Glucose, UA: NEGATIVE
KETONES UA: NEGATIVE
NITRITE UA: NEGATIVE
PH UA: 8
PROTEIN UA: POSITIVE
Spec Grav, UA: 1.01
Urobilinogen, UA: NEGATIVE

## 2014-05-21 MED ORDER — PHENAZOPYRIDINE HCL 95 MG PO TABS: 95.0000 mg | ORAL_TABLET | Freq: Three times a day (TID) | ORAL | Status: DC | PRN

## 2014-05-21 MED ORDER — CIPROFLOXACIN HCL 500 MG PO TABS: 500.0000 mg | ORAL_TABLET | Freq: Two times a day (BID) | ORAL | Status: DC

## 2014-05-21 NOTE — Progress Notes (Signed)
   Subjective:    Patient ID: Brittany [name], female    DOB: 1946-02-04, 69 y.o.   MRN: [id number]  Chief Complaint  Patient presents with  . Urinary Frequency    x3 weeks, Urinary frequency, constantly feels like she has to go to the bathroom and was seen 2 times for the same symptoms not any better    HPI:  Brittany [name] is a 69 y.o. female who presents today for an acute visit.  Associated symptoms of urinary frequency and dysuria for about 3 weeks. Denies fevers, chills or back pain. Was seen at Urgent Care and placed on Septra DS which she completed and then had symptoms return upon completion and returned to Urgent Care with similar symptoms and was then placed on Macrobid x 7 days which has not worked.    Allergies  Allergen Reactions  . Ibandronate Sodium     REACTION: intolerance--BONIVA  . Ibandronic Acid Nausea Only  . Lipitor [Atorvastatin]     arthralgias    Current Outpatient Prescriptions on File Prior to Visit  Medication Sig Dispense Refill  . ASPIRIN PO Take 81 mg by mouth daily.     [name] BIOTIN PO Take by mouth daily.    [name] CALCIUM CITRATE-VITAMIN D PO     . Cholecalciferol (VITAMIN D-3 PO) Take 1 tablet by mouth daily.     . citalopram (CELEXA) 40 MG tablet Take 1 tablet (40 mg total) by mouth daily. 90 tablet 3  . Cyanocobalamin (VITAMIN B-12 CR PO) Take by mouth.    . diphenhydrAMINE (BENADRYL) 25 MG tablet Take 25 mg by mouth every 6 (six) hours as needed.    . Omega-3 Fatty Acids (FISH OIL) 1000 MG CAPS Take 1 capsule by mouth daily.      . pravastatin (PRAVACHOL) 20 MG tablet Take 1 tablet (20 mg total) by mouth daily. 90 tablet 3  . ranitidine (ZANTAC) 150 MG tablet TAKE 1 TABLET TWICE DAILY 180 tablet 1  . SUMAtriptan (IMITREX) 100 MG tablet Take 1 tablet (100 mg total) by mouth once. May repeat in 2 hours if headache persists or recurs. 9 tablet 3  . zolpidem (AMBIEN) 10 MG tablet Take 1 tablet (10 mg total) by mouth at bedtime as needed for sleep. 90 tablet  2   No current facility-administered medications on file prior to visit.    Review of Systems  Constitutional: Negative for fever and chills.  Genitourinary: Positive for dysuria and urgency. Negative for frequency and flank pain.      Objective:    BP 122/80 mmHg  Pulse 66  Temp(Src) 97.5 F (36.4 C) (Oral)  Resp 18  Wt 126 lb (57.153 kg)  SpO2 98% Nursing note and vital signs reviewed.  Physical Exam  Constitutional: She is oriented to person, place, and time. She appears well-developed and well-nourished. No distress.  Cardiovascular: Normal rate, regular rhythm, normal heart sounds and intact distal pulses.   Pulmonary/Chest: Effort normal and breath sounds normal.  Abdominal: There is no CVA tenderness.  Neurological: She is alert and oriented to person, place, and time.  Skin: Skin is warm and dry.  Psychiatric: She has a normal mood and affect. Her behavior is normal. Judgment and thought content normal.       Assessment & Plan:

## 2014-05-21 NOTE — Assessment & Plan Note (Signed)
Symptoms and exam consistent with refractory cystitis. In office urinalysis shows large leukocytes and hematuria. Patient was previously treated with Septra and Macrobid which were unsuccessful. Send urine for culture. Start Cipro. Start Pyridium as needed for dysuria. Follow-up if symptoms worsen or fail to improve.

## 2014-05-21 NOTE — Patient Instructions (Signed)
Thank you for choosing [facility].  Summary/Instructions:  Your prescription(s) have been submitted to your pharmacy or been printed and provided for you. Please take as directed and contact our office if you believe you are having problem(s) with the medication(s) or have any questions.  If your symptoms worsen or fail to improve, please contact our office for further instruction, or in case of emergency go directly to the emergency room at the closest medical facility.   Urinary Tract Infection Urinary tract infections (UTIs) can develop anywhere along your urinary tract. Your urinary tract is your body's drainage system for removing wastes and extra water. Your urinary tract includes two kidneys, two ureters, a bladder, and a urethra. Your kidneys are a pair of bean-shaped organs. Each kidney is about the size of your fist. They are located below your ribs, one on each side of your spine. CAUSES Infections are caused by microbes, which are microscopic organisms, including fungi, viruses, and bacteria. These organisms are so small that they can only be seen through a microscope. Bacteria are the microbes that most commonly cause UTIs. SYMPTOMS  Symptoms of UTIs may vary by age and gender of the patient and by the location of the infection. Symptoms in young women typically include a frequent and intense urge to urinate and a painful, burning feeling in the bladder or urethra during urination. Older women and men are more likely to be tired, shaky, and weak and have muscle aches and abdominal pain. A fever may mean the infection is in your kidneys. Other symptoms of a kidney infection include pain in your back or sides below the ribs, nausea, and vomiting. DIAGNOSIS To diagnose a UTI, your caregiver will ask you about your symptoms. Your caregiver also will ask to provide a urine sample. The urine sample will be tested for bacteria and white blood cells. White blood cells are made by your  body to help fight infection. TREATMENT  Typically, UTIs can be treated with medication. Because most UTIs are caused by a bacterial infection, they usually can be treated with the use of antibiotics. The choice of antibiotic and length of treatment depend on your symptoms and the type of bacteria causing your infection. HOME CARE INSTRUCTIONS  If you were prescribed antibiotics, take them exactly as your caregiver instructs you. Finish the medication even if you feel better after you have only taken some of the medication.  Drink enough water and fluids to keep your urine clear or pale yellow.  Avoid caffeine, tea, and carbonated beverages. They tend to irritate your bladder.  Empty your bladder often. Avoid holding urine for long periods of time.  Empty your bladder before and after sexual intercourse.  After a bowel movement, women should cleanse from front to back. Use each tissue only once. SEEK MEDICAL CARE IF:   You have back pain.  You develop a fever.  Your symptoms do not begin to resolve within 3 days. SEEK IMMEDIATE MEDICAL CARE IF:   You have severe back pain or lower abdominal pain.  You develop chills.  You have nausea or vomiting.  You have continued burning or discomfort with urination. MAKE SURE YOU:   Understand these instructions.  Will watch your condition.  Will get help right away if you are not doing well or get worse. Document Released: 11/26/2004 Document Revised: 08/18/2011 Document Reviewed: 03/27/2011 ExitCare Patient Information 2015 [employer]. This information is not intended to replace advice given to you by your health care provider.   Make sure you discuss any questions you have with your health care provider.   

## 2014-05-21 NOTE — Progress Notes (Signed)
Pre visit review using our clinic review tool, if applicable. No additional management support is needed unless otherwise documented below in the visit note. 

## 2014-05-24 ENCOUNTER — Encounter: Payer: Self-pay | Admitting: Obstetrics and Gynecology

## 2014-05-24 ENCOUNTER — Encounter: Payer: Self-pay | Admitting: Family

## 2014-05-24 ENCOUNTER — Ambulatory Visit (INDEPENDENT_AMBULATORY_CARE_PROVIDER_SITE_OTHER): Payer: Commercial Managed Care - HMO | Admitting: Obstetrics and Gynecology

## 2014-05-24 VITALS — BP 108/70 | HR 70 | Resp 14 | Ht 66.0 in | Wt 125.0 lb

## 2014-05-24 DIAGNOSIS — Z01419 Encounter for gynecological examination (general) (routine) without abnormal findings: Secondary | ICD-10-CM | POA: Diagnosis not present

## 2014-05-24 LAB — URINE CULTURE: Colony Count: >=100000

## 2014-05-24 NOTE — Patient Instructions (Signed)

## 2014-05-24 NOTE — Progress Notes (Signed)
Patient ID: Brittany [name], female   DOB: 09-Nov-1945, 69 y.o.   MRN: [id number] 68 y.o. G3P3 MarriedCaucasianF here for annual exam.    On Cipro for E Coli UTI confirmed on urine culture from 05/21/14. Previously took [place] and [place]. Having some burning with urination but no vaginal itching.   History of right breast cancer. Status post lumpectomy, XRT, and chemotherapy.   Had mammogram and bone density through Dr. Jana Hakim. Doing Zometa injections yearly through Dr. Jana Hakim.   Had episode of chest pain and had stress test which was negative.  PCP:  Walker Kehr, MD  Patient's last menstrual period was 03/02/1994 (approximate).          Sexually active: Yes.  female partner  The current method of family planning is post menopausal status.    Exercising: Yes.    gym classes, zumba, walking. Smoker:  Former smoker  Health Maintenance: Pap:  05-18-13 wnl:no HR HPV testing History of abnormal Pap:  no MMG:  05-15-13 heterogeneously dense/nl:[facility].  Had diagnostic mammogram last week (Due to breast cancer history) and this was negative per patient report.  Colonoscopy: 2013 normal at [facility] in [facility].  Next due 2018.  BMD:   05-15-13 Osteopenia:[facility] TDaP:  2011 Screening Labs:  Hb today: PCP, Urine today: trace white blood cells.   reports that she quit smoking about 47 years ago. Her smoking use included Cigarettes. She quit after 5 years of use. She has never used smokeless tobacco. She reports that she drinks about 3.6 oz of alcohol per week. She reports that she does not use illicit drugs.  Past Medical History  Diagnosis Date  . Cancer     breast right  triple recet neg, lumpect, chemo, XRT  . Breast cancer 11/23/2007    right, ER/PR -, HER 2 -  . History of radiation therapy 07/25/08 -09/11/08    right breast  . Osteopenia   . Esophageal stricture   . Gastritis 10/2007  . Migraines     rare  . History of chemotherapy     neoadjuvant chemo, last dose 03/2008  .  Hyperlipidemia   . Acid reflux     Past Surgical History  Procedure Laterality Date  . Breast lumpectomy  06/26/2008    right, CA   XRT, chemo    Current Outpatient Prescriptions  Medication Sig Dispense Refill  . ASPIRIN PO Take 81 mg by mouth daily.     [name] BIOTIN PO Take by mouth daily.    [name] CALCIUM CITRATE-VITAMIN D PO     . Cholecalciferol (VITAMIN D-3 PO) Take 1 tablet by mouth daily.     . ciprofloxacin (CIPRO) 500 MG tablet Take 1 tablet (500 mg total) by mouth 2 (two) times daily. 14 tablet 0  . citalopram (CELEXA) 40 MG tablet Take 1 tablet (40 mg total) by mouth daily. 90 tablet 3  . Cyanocobalamin (VITAMIN B-12 CR PO) Take by mouth.    . diphenhydrAMINE (BENADRYL) 25 MG tablet Take 25 mg by mouth every 6 (six) hours as needed.    . Omega-3 Fatty Acids (FISH OIL) 1000 MG CAPS Take 1 capsule by mouth daily.      . phenazopyridine (PYRIDIUM) 95 MG tablet Take 1 tablet (95 mg total) by mouth 3 (three) times daily as needed for pain. 10 tablet 0  . pravastatin (PRAVACHOL) 20 MG tablet Take 1 tablet (20 mg total) by mouth daily. 90 tablet 3  . ranitidine (ZANTAC) 150 MG tablet  TAKE 1 TABLET TWICE DAILY 180 tablet 1  . SUMAtriptan (IMITREX) 100 MG tablet Take 1 tablet (100 mg total) by mouth once. May repeat in 2 hours if headache persists or recurs. 9 tablet 3  . zolpidem (AMBIEN) 10 MG tablet Take 1 tablet (10 mg total) by mouth at bedtime as needed for sleep. 90 tablet 2   No current facility-administered medications for this visit.    Family History  Problem Relation Age of Onset  . Heart disease Father   . Cancer Father     lung  . Cancer Sister 69    Breast & Colorectal  . Epilepsy Mother   . Dementia Mother     ROS:  Pertinent items are noted in HPI.  Otherwise, a comprehensive ROS was negative.  Exam:   BP 108/70 mmHg  Pulse 70  Resp 14  Ht 5\' 6"  (1.676 m)  Wt 125 lb (56.7 kg)  BMI 20.19 kg/m2  LMP 03/02/1994 (Approximate)     Height: 5\' 6"  (167.6 cm)  Ht  Readings from Last 3 Encounters:  05/24/14 5\' 6"  (1.676 m)  05/08/14 5\' 6"  (1.676 m)  03/12/14 5' 6.5" (1.689 m)    General appearance: alert, cooperative and appears stated age Head: Normocephalic, without obvious abnormality, atraumatic Neck: no adenopathy, supple, symmetrical, trachea midline and thyroid normal to inspection and palpation Lungs: clear to auscultation bilaterally Breasts:Left breast -  normal appearance, no masses or tenderness, Inspection negative, No nipple retraction or dimpling, No nipple discharge or bleeding, No axillary or supraclavicular adenopathy, Normal to palpation without dominant masses Right breast with scar left lateral and under axilla.  No masses, tenderness, or axillary adenopathy.  Heart: regular rate and rhythm Abdomen: soft, non-tender; bowel sounds normal; no masses,  no organomegaly Extremities: extremities normal, atraumatic, no cyanosis or edema Skin: Skin color, texture, turgor normal. No rashes or lesions Lymph nodes: Cervical, supraclavicular, and axillary nodes normal. No abnormal inguinal nodes palpated Neurologic: Grossly normal   Pelvic: External genitalia:  no lesions              Urethra:  normal appearing urethra with no masses, tenderness or lesions              Bartholins and Skenes: normal                 Vagina: normal appearing vagina with normal color and discharge, no lesions              Cervix: no lesions              Pap taken: No. Bimanual Exam:  Uterus:  normal size, contour, position, consistency, mobility, non-tender              Adnexa: normal adnexa and no mass, fullness, tenderness               Rectovaginal: Confirms               Anus:  normal sphincter tone, no lesions  Chaperone was present for exam.  A:  Well Woman with normal exam History of right breast cancer.  Osteopenia.  On Zometa for treatment.  E Coli UTI.  On Cipro.  P:   Mammogram yearly.  Do self breast exams. pap smear in 2018.  Discussed  weight bearing exercise, Ca, and Vit D. Finish Cipro course. Hydrate well.  return annually or prn.  Patient may choose to come every other year for her well woman visit.  I think this is reasonable.

## 2014-05-31 ENCOUNTER — Encounter: Payer: Self-pay | Admitting: Internal Medicine

## 2014-05-31 ENCOUNTER — Telehealth: Payer: Self-pay | Admitting: *Deleted

## 2014-05-31 ENCOUNTER — Other Ambulatory Visit: Payer: Self-pay | Admitting: Internal Medicine

## 2014-05-31 DIAGNOSIS — C50919 Malignant neoplasm of unspecified site of unspecified female breast: Secondary | ICD-10-CM

## 2014-05-31 NOTE — Telephone Encounter (Signed)
Pt. Calls to see what injection she is getting on 06-11-14 so she can check with her insurance.  She is scheduled for prolia.  This is the same thing she had last time in October 2015.   She appreciated my help and will check with her insurance co.

## 2014-06-04 ENCOUNTER — Telehealth: Payer: Self-pay | Admitting: *Deleted

## 2014-06-04 NOTE — Telephone Encounter (Signed)
Received referral for pt to see Dr. Jana Hakim.  Noticed that she is scheduled to see Elzie Rings on 4/11.  Called and left a message for Erline Levine at [facility] office requesting for some direction on the referral.

## 2014-06-04 NOTE — Telephone Encounter (Signed)
Per PCP- pt contacted him requesting a new referral due to [facility] requirements. There are no new findings. Thanks!

## 2014-06-11 ENCOUNTER — Other Ambulatory Visit: Payer: Self-pay | Admitting: *Deleted

## 2014-06-11 ENCOUNTER — Telehealth: Payer: Self-pay | Admitting: *Deleted

## 2014-06-11 ENCOUNTER — Ambulatory Visit: Payer: Commercial Managed Care - HMO | Admitting: Adult Health

## 2014-06-11 ENCOUNTER — Ambulatory Visit: Payer: Commercial Managed Care - HMO

## 2014-06-11 ENCOUNTER — Telehealth: Payer: Self-pay | Admitting: Adult Health

## 2014-06-11 NOTE — Telephone Encounter (Signed)
I received a call from an injection nurse that Ms. Burandt had called to cancel her Prolia injection appointment due to her insurance change/coverage.   I called Ms. Pae to see if she would like to reschedule her Survivorship Clinic appointment, as we were trying to coordinate these 2 visits for today (injection and then see me in survivorship).  I left Ms. Wallen a message and asked that she return my call when she was available.  I offered her the option of rescheduling her appointment to see me in clinic or I could mail her a copy of her Kay[place]e, whichever she prefers.   Mike Craze, NP [place] [phone]

## 2014-06-11 NOTE — Telephone Encounter (Signed)
Called patient about missed appointment.  Brittany [name] states that she called this AM to cancel appointment due to insurance telling her she needed authorization again.  She has attempted to find out shat is going on.  She states that she had a real hard time understanding the person on the phone.  They gave her an authorization number from the previous injection ([id number])   I will asked Raquel to look into it.

## 2014-06-12 ENCOUNTER — Telehealth: Payer: Self-pay | Admitting: Adult Health

## 2014-06-12 NOTE — Telephone Encounter (Signed)
I received a voicemail from [name] returning my call regarding her recent missed Prolia injection and her Survivorship clinic visit with me.   She tells me that she is having a lot of trouble with her [employer] coverage and is concerned about things not being covered here at the cancer center.  She is unsure when she could have any answers regarding her new insurance coverage/claims.  From a survivorship standpoint, I offered to mail [name] her survivorship care plan, as well as resources that we would have reviewed in person.  She expressed appreciation for this and agreed to have everything mailed to her home instead of rescheduling an in-person meeting at this time.    [name] plans to keep her appointment with Dr. Jana Hakim in 12/2014.  I encouraged her to call me directly if she has any questions or concerns regarding her survivorship care plan or any other concerns, that I would be happy to help!  She expressed gratitude for my call.

## 2014-06-18 ENCOUNTER — Other Ambulatory Visit: Payer: Self-pay | Admitting: Internal Medicine

## 2014-06-19 NOTE — Telephone Encounter (Signed)
sch ov 

## 2014-06-20 NOTE — Telephone Encounter (Signed)
Notified pharmacy spoke with Brittany [name] gave md approval.../lmb

## 2014-06-29 ENCOUNTER — Encounter: Payer: Self-pay | Admitting: Adult Health

## 2014-06-29 NOTE — Progress Notes (Signed)
Brittany [name] declined an in-person meeting in the [facility].  Therefore, a letter and a copy of her Survivorship Care Plan document was mailed to her home per her request.  The letter encourages [name] to review the care plan and contact me with any questions or concerns.  My business card was included in the correspondence to the patient as well.  A copy of the care plan was also routed/faxed/mailed to Walker Kehr, MD, the patient's PCP.  I will not be placing any follow-up appointments to the Survivorship Clinic for [name], although I am happy to see her in the future, if needed.   Mike Craze, NP [place] [phone]

## 2014-07-02 ENCOUNTER — Other Ambulatory Visit: Payer: Self-pay | Admitting: Internal Medicine

## 2014-07-17 ENCOUNTER — Other Ambulatory Visit: Payer: Self-pay | Admitting: Internal Medicine

## 2014-07-31 ENCOUNTER — Other Ambulatory Visit: Payer: Self-pay | Admitting: Oncology

## 2014-07-31 ENCOUNTER — Telehealth: Payer: Self-pay | Admitting: *Deleted

## 2014-07-31 NOTE — Telephone Encounter (Signed)
This information will be forwarded to [facility] in managed care- thank you

## 2014-07-31 NOTE — Telephone Encounter (Signed)
TC from patient stating she has a letter from her insurance company that has approved her Denosumab. She was calling to see if we have that approval on file.. No notes indicating approval found.  Informed patient that I would send message from her to Dr. Jana Hakim and Tivis Ringer, RN and managed care.

## 2014-08-01 ENCOUNTER — Telehealth: Payer: Self-pay | Admitting: Oncology

## 2014-08-01 NOTE — Telephone Encounter (Signed)
Called and left a message to call and  Made her prolia njection

## 2014-08-06 ENCOUNTER — Telehealth: Payer: Self-pay | Admitting: Oncology

## 2014-08-06 NOTE — Telephone Encounter (Signed)
patient called in to reschedule a previously cancelled injection

## 2014-08-13 ENCOUNTER — Ambulatory Visit (HOSPITAL_BASED_OUTPATIENT_CLINIC_OR_DEPARTMENT_OTHER): Payer: Commercial Managed Care - HMO

## 2014-08-13 VITALS — BP 109/57 | HR 68 | Temp 98.2°F

## 2014-08-13 DIAGNOSIS — M810 Age-related osteoporosis without current pathological fracture: Secondary | ICD-10-CM

## 2014-08-13 MED ORDER — DENOSUMAB 60 MG/ML ~~LOC~~ SOLN
60.0000 mg | Freq: Once | SUBCUTANEOUS | Status: AC
  Administered 2014-08-13: 60 mg via SUBCUTANEOUS
  Filled 2014-08-13: qty 1

## 2014-08-27 ENCOUNTER — Other Ambulatory Visit: Payer: Self-pay

## 2014-08-27 ENCOUNTER — Encounter: Payer: Self-pay | Admitting: Internal Medicine

## 2014-09-09 ENCOUNTER — Other Ambulatory Visit: Payer: Self-pay | Admitting: Internal Medicine

## 2014-09-10 ENCOUNTER — Other Ambulatory Visit: Payer: Self-pay | Admitting: Internal Medicine

## 2014-09-10 NOTE — Telephone Encounter (Signed)
Must have OV q 6 mo

## 2014-09-17 ENCOUNTER — Other Ambulatory Visit: Payer: Self-pay | Admitting: Internal Medicine

## 2014-09-25 ENCOUNTER — Ambulatory Visit (INDEPENDENT_AMBULATORY_CARE_PROVIDER_SITE_OTHER): Payer: Commercial Managed Care - HMO | Admitting: Internal Medicine

## 2014-09-25 ENCOUNTER — Encounter: Payer: Self-pay | Admitting: Internal Medicine

## 2014-09-25 VITALS — BP 100/78 | HR 67 | Wt 128.0 lb

## 2014-09-25 DIAGNOSIS — F329 Major depressive disorder, single episode, unspecified: Secondary | ICD-10-CM

## 2014-09-25 DIAGNOSIS — F32A Depression, unspecified: Secondary | ICD-10-CM

## 2014-09-25 DIAGNOSIS — F411 Generalized anxiety disorder: Secondary | ICD-10-CM

## 2014-09-25 DIAGNOSIS — R4189 Other symptoms and signs involving cognitive functions and awareness: Secondary | ICD-10-CM

## 2014-09-25 DIAGNOSIS — E538 Deficiency of other specified B group vitamins: Secondary | ICD-10-CM

## 2014-09-25 DIAGNOSIS — E785 Hyperlipidemia, unspecified: Secondary | ICD-10-CM | POA: Diagnosis not present

## 2014-09-25 DIAGNOSIS — B351 Tinea unguium: Secondary | ICD-10-CM

## 2014-09-25 HISTORY — DX: Other symptoms and signs involving cognitive functions and awareness: R41.89

## 2014-09-25 MED ORDER — SUMATRIPTAN SUCCINATE 100 MG PO TABS: 100.0000 mg | ORAL_TABLET | Freq: Once | ORAL | Status: DC

## 2014-09-25 MED ORDER — ZOLPIDEM TARTRATE 10 MG PO TABS: 10.0000 mg | ORAL_TABLET | Freq: Every evening | ORAL | Status: DC | PRN

## 2014-09-25 MED ORDER — CICLOPIROX 8 % EX SOLN: Freq: Every day | CUTANEOUS | Status: DC

## 2014-09-25 MED ORDER — CITALOPRAM HYDROBROMIDE 40 MG PO TABS: 40.0000 mg | ORAL_TABLET | Freq: Every day | ORAL | Status: DC

## 2014-09-25 MED ORDER — RANITIDINE HCL 150 MG PO TABS: 150.0000 mg | ORAL_TABLET | Freq: Two times a day (BID) | ORAL | Status: DC

## 2014-09-25 MED ORDER — PRAVASTATIN SODIUM 20 MG PO TABS: 20.0000 mg | ORAL_TABLET | Freq: Every day | ORAL | Status: DC

## 2014-09-25 NOTE — Assessment & Plan Note (Signed)
The patient will continue pravastatin

## 2014-09-25 NOTE — Assessment & Plan Note (Signed)
[place]

## 2014-09-25 NOTE — Assessment & Plan Note (Signed)
Chronic On Citalopram

## 2014-09-25 NOTE — Assessment & Plan Note (Signed)
On B12 

## 2014-09-25 NOTE — Progress Notes (Signed)
Pre visit review using our clinic review tool, if applicable. No additional management support is needed unless otherwise documented below in the visit note. 

## 2014-09-25 NOTE — Assessment & Plan Note (Signed)
On Citalopram 

## 2014-09-25 NOTE — Progress Notes (Signed)
Subjective:  Patient ID: Brittany [name], female    DOB: 1945/11/13  Age: 69 y.o. MRN: [id number]  CC: No chief complaint on file.   HPI Mozell Haber Llerenas presents for anxiety, insomnia C/o forgetting things, absent minded at times  Outpatient Prescriptions Prior to Visit  Medication Sig Dispense Refill  . ASPIRIN PO Take 81 mg by mouth daily.     [name] BIOTIN PO Take by mouth daily.    [name] CALCIUM CITRATE-VITAMIN D PO     . Cholecalciferol (VITAMIN D-3 PO) Take 1 tablet by mouth daily.     . Cyanocobalamin (VITAMIN B-12 CR PO) Take by mouth.    . diphenhydrAMINE (BENADRYL) 25 MG tablet Take 25 mg by mouth every 6 (six) hours as needed.    . Omega-3 Fatty Acids (FISH OIL) 1000 MG CAPS Take 1 capsule by mouth daily.      . ciprofloxacin (CIPRO) 500 MG tablet Take 1 tablet (500 mg total) by mouth 2 (two) times daily. 14 tablet 0  . citalopram (CELEXA) 40 MG tablet TAKE 1 TABLET EVERY DAY 90 tablet 0  . pravastatin (PRAVACHOL) 20 MG tablet TAKE ONE TABLET BY MOUTH ONE TIME DAILY 15 tablet 0  . ranitidine (ZANTAC) 150 MG tablet TAKE 1 TABLET TWICE DAILY 180 tablet 0  . SUMAtriptan (IMITREX) 100 MG tablet Take 1 tablet (100 mg total) by mouth once. May repeat in 2 hours if headache persists or recurs. 9 tablet 3  . zolpidem (AMBIEN) 10 MG tablet TAKE 1 BY MOUTH NIGHTLY AT BEDTIME AS NEEDED FOR SLEEP. 15 tablet 0  . phenazopyridine (PYRIDIUM) 95 MG tablet Take 1 tablet (95 mg total) by mouth 3 (three) times daily as needed for pain. (Patient not taking: Reported on 09/25/2014) 10 tablet 0   No facility-administered medications prior to visit.    ROS Review of Systems  Constitutional: Negative for chills, activity change, appetite change, fatigue and unexpected weight change.  HENT: Negative for congestion, mouth sores and sinus pressure.   Eyes: Negative for visual disturbance.  Respiratory: Negative for cough and chest tightness.   Gastrointestinal: Negative for nausea, vomiting and abdominal pain.   Genitourinary: Negative for frequency, difficulty urinating and vaginal pain.  Musculoskeletal: Negative for back pain and gait problem.  Skin: Negative for pallor and rash.  Neurological: Negative for dizziness, tremors, weakness, numbness and headaches.  Psychiatric/Behavioral: Positive for decreased concentration. Negative for behavioral problems, confusion, sleep disturbance and agitation. The patient is not nervous/anxious.     Objective:  BP 100/78 mmHg  Pulse 67  Wt 128 lb (58.06 kg)  SpO2 98%  LMP 03/02/1994 (Approximate)  BP Readings from Last 3 Encounters:  09/25/14 100/78  08/13/14 109/57  05/24/14 108/70    Wt Readings from Last 3 Encounters:  09/25/14 128 lb (58.06 kg)  05/24/14 125 lb (56.7 kg)  05/21/14 126 lb (57.153 kg)    Physical Exam  Constitutional: She appears well-developed. No distress.  HENT:  Head: Normocephalic.  Right Ear: External ear normal.  Left Ear: External ear normal.  Nose: Nose normal.  Mouth/Throat: Oropharynx is clear and moist.  Eyes: Conjunctivae are normal. Pupils are equal, round, and reactive to light. Right eye exhibits no discharge. Left eye exhibits no discharge.  Neck: Normal range of motion. Neck supple. No JVD present. No tracheal deviation present. No thyromegaly present.  Cardiovascular: Normal rate, regular rhythm and normal heart sounds.   Pulmonary/Chest: No stridor. No respiratory distress. She has no wheezes.  Abdominal:  Soft. Bowel sounds are normal. She exhibits no distension and no mass. There is no tenderness. There is no rebound and no guarding.  Musculoskeletal: She exhibits no edema or tenderness.  Lymphadenopathy:    She has no cervical adenopathy.  Neurological: She displays normal reflexes. No cranial nerve deficit. She exhibits normal muscle tone. Coordination normal.  Skin: No rash noted. No erythema.  Psychiatric: She has a normal mood and affect. Her behavior is normal. Judgment and thought content  normal.  a/o/c L big toe fungus Lab Results  Component Value Date   WBC 6.6 03/12/2014   HGB 13.6 03/12/2014   HCT 41.3 03/12/2014   PLT 292 03/12/2014   GLUCOSE 95 03/12/2014   CHOL 156 05/08/2014   TRIG 61.0 05/08/2014   HDL 58.60 05/08/2014   LDLDIRECT 178.9 03/23/2013   LDLCALC 85 05/08/2014   ALT 13 03/12/2014   AST 16 03/12/2014   NA 137 03/12/2014   K 3.7 03/12/2014   CL 98 03/21/2012   CREATININE 0.7 03/12/2014   BUN 10.7 03/12/2014   CO2 26 03/12/2014   TSH 1.41 03/23/2013    Dg Chest 2 View  12/11/2013   CLINICAL DATA:  Cough R05 (ICD-10-CM), chronic for 1 year. Initial encounter.  EXAM: CHEST  2 VIEW  COMPARISON:  12/01/2011.  FINDINGS: Trachea is midline. Heart size normal. Biapical pleural parenchymal scarring. Lungs may be mildly hyperinflated but are otherwise clear.  IMPRESSION: Mild hyperinflation.  No acute findings.   Electronically Signed   By: Lorin Picket M.D.   On: 12/11/2013 17:30    Assessment & Plan:   Diagnoses and all orders for this visit:  Onychomycosis  Other orders -     pravastatin (PRAVACHOL) 20 MG tablet; Take 1 tablet (20 mg total) by mouth daily. -     zolpidem (AMBIEN) 10 MG tablet; Take 1 tablet (10 mg total) by mouth at bedtime as needed for sleep. -     citalopram (CELEXA) 40 MG tablet; Take 1 tablet (40 mg total) by mouth daily. -     ranitidine (ZANTAC) 150 MG tablet; Take 1 tablet (150 mg total) by mouth 2 (two) times daily. -     SUMAtriptan (IMITREX) 100 MG tablet; Take 1 tablet (100 mg total) by mouth once. May repeat in 2 hours if headache persists or recurs. -     ciclopirox (PENLAC) 8 % solution; Apply topically at bedtime. Apply over nail and surrounding skin. Apply daily over previous coat. After seven (7) days, may remove with alcohol and continue cycle.  I have discontinued Ms. Sawyers's ciprofloxacin and phenazopyridine. I have also changed her pravastatin, zolpidem, citalopram, and ranitidine. Additionally, I am  having her start on ciclopirox. Lastly, I am having her maintain her Cholecalciferol (VITAMIN D-3 PO), ASPIRIN PO, Fish Oil, Cyanocobalamin (VITAMIN B-12 CR PO), BIOTIN PO, diphenhydrAMINE, CALCIUM CITRATE-VITAMIN D PO, and SUMAtriptan.  Meds ordered this encounter  Medications  . pravastatin (PRAVACHOL) 20 MG tablet    Sig: Take 1 tablet (20 mg total) by mouth daily.    Dispense:  90 tablet    Refill:  3  . zolpidem (AMBIEN) 10 MG tablet    Sig: Take 1 tablet (10 mg total) by mouth at bedtime as needed for sleep.    Dispense:  90 tablet    Refill:  0  . citalopram (CELEXA) 40 MG tablet    Sig: Take 1 tablet (40 mg total) by mouth daily.    Dispense:  90 tablet  Refill:  1  . ranitidine (ZANTAC) 150 MG tablet    Sig: Take 1 tablet (150 mg total) by mouth 2 (two) times daily.    Dispense:  180 tablet    Refill:  3  . SUMAtriptan (IMITREX) 100 MG tablet    Sig: Take 1 tablet (100 mg total) by mouth once. May repeat in 2 hours if headache persists or recurs.    Dispense:  9 tablet    Refill:  3  . ciclopirox (PENLAC) 8 % solution    Sig: Apply topically at bedtime. Apply over nail and surrounding skin. Apply daily over previous coat. After seven (7) days, may remove with alcohol and continue cycle.    Dispense:  6.6 mL    Refill:  0     Follow-up: Return in about 6 months (around 03/28/2015) for Wellness Exam.  Walker Kehr, MD

## 2014-09-25 NOTE — Assessment & Plan Note (Signed)
2016 Very mild, could be related to meds Reduce Citalopram Reduce Zolpidem

## 2014-12-05 ENCOUNTER — Telehealth: Payer: Self-pay

## 2014-12-05 NOTE — Telephone Encounter (Signed)
Called patient back however writer was unable to help her because she was looking for someone that could preauth her next appt.

## 2014-12-11 ENCOUNTER — Telehealth: Payer: Self-pay | Admitting: Adult Health

## 2014-12-11 ENCOUNTER — Ambulatory Visit: Payer: Commercial Managed Care - HMO

## 2014-12-11 ENCOUNTER — Other Ambulatory Visit: Payer: Self-pay | Admitting: Adult Health

## 2014-12-11 ENCOUNTER — Other Ambulatory Visit: Payer: Commercial Managed Care - HMO

## 2014-12-11 ENCOUNTER — Ambulatory Visit: Payer: Commercial Managed Care - HMO | Admitting: Adult Health

## 2014-12-11 DIAGNOSIS — M810 Age-related osteoporosis without current pathological fracture: Secondary | ICD-10-CM

## 2014-12-11 DIAGNOSIS — C50911 Malignant neoplasm of unspecified site of right female breast: Secondary | ICD-10-CM

## 2014-12-11 NOTE — Progress Notes (Signed)
Pt scheduled for prolia injection today. Per Pharmacy pt is not due for injection until Dec. Pt gets prolia every 6 months and received one in June. Informed Elzie Rings and spoke with Zigmund Daniel, RN who confirmed pt not to get injection today. POF sent to schedulers to reschedule labs and injection appt for beginning of December. Elzie Rings is attempting to reach pt to inform her she does not need to come today.

## 2014-12-11 NOTE — Telephone Encounter (Signed)
I verified with nursing that the patient does indeed need a lab draw today before she can receive her Prolia injection.  I called the patient and left a message to let her know that she should come to the cancer center at 1:45pm for labs and then she will receive her injection at 2:15pm today.  I encouraged her to call me with any questions.   I have placed the lab orders as well.   Mike Craze, NP [place] [phone]

## 2014-12-11 NOTE — Telephone Encounter (Signed)
I spoke to Brittany [name] to make her aware that per pharmacy, her Prolia injection is not due for another 2 months.  I let Ms. Haynie know that nursing was making scheduling aware and they would be in contact with her about rescheduling these appts.  I apologized for the confusion and encouraged her to call me anytime in the future with any questions or concerns.  She voiced appreciation and agreed to the plan.   Mike Craze, NP [place] [phone]

## 2014-12-11 NOTE — Telephone Encounter (Signed)
I spoke with Ms. Priola and she let me know that she was concerned regarding her insurance coverage to come to the cancer center today for an office visit and for her Prolia injection.  She states that she has had difficulty reaching the appropriate people from both [facility] and her "[facility]" insurance carriers to get prior auth for her Prolia injection.  She states that her insurance company had received the request and "it has to go to so many different people before it can get approved, so I'm not sure if it has been approved or not."    In looking back at previous phone notes, it looks like the denosumab (Prolia) injection was approved for her prior injection and she does not think there will be issues with them approving it for this injection.  She stated, "I'm going to go ahead and plan on coming today for my injection and then will work with the insurance company to sort it all out later if I have to."    I left a voicemail with Carmelina Noun, our prior auth specialist here at the cancer center regarding Ms. Ethington's concerns.    I let Ms. Duley know that she does not need to be seen for a lab or office visit today, but we can see her in survivorship in 06/2015 after she has had her annual mammogram, DEXA imaging, and for her next Prolia.  I made her aware that Chestine Spore, our breast survivorship NP would see her at that time and she agreed to this plan.  I will place the necessary orders for imaging (if needed) and request an appointment with Chestine Spore for 06/2015.    I encouraged Ms. Bonny to call me with any questions or concerns.  She agreed to the above plan and voiced understanding and appreciation for my call.   Mike Craze, NP [place] [phone]

## 2014-12-12 ENCOUNTER — Telehealth: Payer: Self-pay | Admitting: Oncology

## 2014-12-12 NOTE — Telephone Encounter (Signed)
lvm for pt regarding to April appt....ok adn aware

## 2015-01-30 ENCOUNTER — Telehealth: Payer: Self-pay | Admitting: Internal Medicine

## 2015-01-30 NOTE — Telephone Encounter (Signed)
Patient is requesting [facility] referral to Dr. Katy Fitch.  Patient has appointment schedule 12/8.  Patient is going to have eyes checked because they are real red.

## 2015-01-31 NOTE — Telephone Encounter (Signed)
Brittany [name] Brittany [name]  LI:[id number] valid 02/07/2015 - 08/06/2015 for 6 visits

## 2015-02-05 ENCOUNTER — Telehealth: Payer: Self-pay | Admitting: *Deleted

## 2015-02-05 ENCOUNTER — Other Ambulatory Visit: Payer: Self-pay | Admitting: Adult Health

## 2015-02-05 DIAGNOSIS — C50911 Malignant neoplasm of unspecified site of right female breast: Secondary | ICD-10-CM

## 2015-02-05 DIAGNOSIS — M810 Age-related osteoporosis without current pathological fracture: Secondary | ICD-10-CM

## 2015-02-05 NOTE — Telephone Encounter (Signed)
Pt called and left message re:  Her insurance has approved for pt to receive Denosumab injection.  Pt would like to know when she can schedule her injection in December;  Pt stated her insurance might change in January.   Message sent to Dr. Jana Hakim and desk nurse. Pt's   Phone    [phone].

## 2015-02-05 NOTE — Telephone Encounter (Signed)
I will let scheduling know to have them contact her for her injection.  She is scheduled to see Chestine Spore, NP for labs and survivorship visit in 06/2015.  She will need a calcium lab prior to her upcoming injection. I will place those orders and place POF for the patient to be able to get her injection before the end of December.   Mike Craze, NP [place] [phone]

## 2015-02-06 ENCOUNTER — Telehealth: Payer: Self-pay | Admitting: Oncology

## 2015-02-06 ENCOUNTER — Other Ambulatory Visit: Payer: Self-pay | Admitting: Oncology

## 2015-02-06 NOTE — Telephone Encounter (Signed)
lvm for pt regarding to DEc appt.

## 2015-02-12 ENCOUNTER — Other Ambulatory Visit (HOSPITAL_BASED_OUTPATIENT_CLINIC_OR_DEPARTMENT_OTHER): Payer: Commercial Managed Care - HMO

## 2015-02-12 ENCOUNTER — Ambulatory Visit (HOSPITAL_BASED_OUTPATIENT_CLINIC_OR_DEPARTMENT_OTHER): Payer: Commercial Managed Care - HMO

## 2015-02-12 VITALS — BP 117/54 | HR 65 | Temp 98.3°F

## 2015-02-12 DIAGNOSIS — C50911 Malignant neoplasm of unspecified site of right female breast: Secondary | ICD-10-CM

## 2015-02-12 DIAGNOSIS — M810 Age-related osteoporosis without current pathological fracture: Secondary | ICD-10-CM | POA: Diagnosis not present

## 2015-02-12 LAB — COMPREHENSIVE METABOLIC PANEL
ALT: 11 U/L (ref 0–55)
AST: 16 U/L (ref 5–34)
Albumin: 3.7 g/dL (ref 3.5–5.0)
Alkaline Phosphatase: 64 U/L (ref 40–150)
Anion Gap: 8 mEq/L (ref 3–11)
BUN: 10.5 mg/dL (ref 7.0–26.0)
CALCIUM: 9.4 mg/dL (ref 8.4–10.4)
CHLORIDE: 100 meq/L (ref 98–109)
CO2: 29 mEq/L (ref 22–29)
CREATININE: 0.8 mg/dL (ref 0.6–1.1)
EGFR: 78 mL/min/{1.73_m2} — ABNORMAL LOW (ref >90)
Glucose: 111 mg/dl (ref 70–140)
Potassium: 3.5 mEq/L (ref 3.5–5.1)
Sodium: 137 mEq/L (ref 136–145)
TOTAL PROTEIN: 7 g/dL (ref 6.4–8.3)
Total Bilirubin: 0.34 mg/dL (ref 0.20–1.20)

## 2015-02-12 MED ORDER — HEPARIN SOD (PORK) LOCK FLUSH 100 UNIT/ML IV SOLN
250.0000 [IU] | Freq: Once | INTRAVENOUS | Status: DC | PRN
  Filled 2015-02-12: qty 5

## 2015-02-12 MED ORDER — DENOSUMAB 60 MG/ML ~~LOC~~ SOLN
60.0000 mg | Freq: Once | SUBCUTANEOUS | Status: AC
  Administered 2015-02-12: 60 mg via SUBCUTANEOUS
  Filled 2015-02-12: qty 1

## 2015-02-12 NOTE — Patient Instructions (Signed)
Denosumab injection  What is this medicine?  DENOSUMAB (den oh sue mab) slows bone breakdown. Prolia is used to treat osteoporosis in women after menopause and in men. Xgeva is used to prevent bone fractures and other bone problems caused by cancer bone metastases. Xgeva is also used to treat giant cell tumor of the bone.  This medicine may be used for other purposes; ask your health care provider or pharmacist if you have questions.  What should I tell my health care provider before I take this medicine?  They need to know if you have any of these conditions:  -dental disease  -eczema  -infection or history of infections  -kidney disease or on dialysis  -low blood calcium or vitamin D  -malabsorption syndrome  -scheduled to have surgery or tooth extraction  -taking medicine that contains denosumab  -thyroid or parathyroid disease  -an unusual reaction to denosumab, other medicines, foods, dyes, or preservatives  -pregnant or trying to get pregnant  -breast-feeding  How should I use this medicine?  This medicine is for injection under the skin. It is given by a health care professional in a hospital or clinic setting.  If you are getting Prolia, a special MedGuide will be given to you by the pharmacist with each prescription and refill. Be sure to read this information carefully each time.  For Prolia, talk to your pediatrician regarding the use of this medicine in children. Special care may be needed. For Xgeva, talk to your pediatrician regarding the use of this medicine in children. While this drug may be prescribed for children as young as 13 years for selected conditions, precautions do apply.  Overdosage: If you think you have taken too much of this medicine contact a poison control center or emergency room at once.  NOTE: This medicine is only for you. Do not share this medicine with others.  What if I miss a dose?  It is important not to miss your dose. Call your doctor or health care professional if you are  unable to keep an appointment.  What may interact with this medicine?  Do not take this medicine with any of the following medications:  -other medicines containing denosumab  This medicine may also interact with the following medications:  -medicines that suppress the immune system  -medicines that treat cancer  -steroid medicines like prednisone or cortisone  This list may not describe all possible interactions. Give your health care provider a list of all the medicines, herbs, non-prescription drugs, or dietary supplements you use. Also tell them if you smoke, drink alcohol, or use illegal drugs. Some items may interact with your medicine.  What should I watch for while using this medicine?  Visit your doctor or health care professional for regular checks on your progress. Your doctor or health care professional may order blood tests and other tests to see how you are doing.  Call your doctor or health care professional if you get a cold or other infection while receiving this medicine. Do not treat yourself. This medicine may decrease your body's ability to fight infection.  You should make sure you get enough calcium and vitamin D while you are taking this medicine, unless your doctor tells you not to. Discuss the foods you eat and the vitamins you take with your health care professional.  See your dentist regularly. Brush and floss your teeth as directed. Before you have any dental work done, tell your dentist you are receiving this medicine.  Do   not become pregnant while taking this medicine or for 5 months after stopping it. Women should inform their doctor if they wish to become pregnant or think they might be pregnant. There is a potential for serious side effects to an unborn child. Talk to your health care professional or pharmacist for more information.  What side effects may I notice from receiving this medicine?  Side effects that you should report to your doctor or health care professional as soon as  possible:  -allergic reactions like skin rash, itching or hives, swelling of the face, lips, or tongue  -breathing problems  -chest pain  -fast, irregular heartbeat  -feeling faint or lightheaded, falls  -fever, chills, or any other sign of infection  -muscle spasms, tightening, or twitches  -numbness or tingling  -skin blisters or bumps, or is dry, peels, or red  -slow healing or unexplained pain in the mouth or jaw  -unusual bleeding or bruising  Side effects that usually do not require medical attention (Report these to your doctor or health care professional if they continue or are bothersome.):  -muscle pain  -stomach upset, gas  This list may not describe all possible side effects. Call your doctor for medical advice about side effects. You may report side effects to FDA at 1-800-FDA-1088.  Where should I keep my medicine?  This medicine is only given in a clinic, doctor's office, or other health care setting and will not be stored at home.  NOTE: This sheet is a summary. It may not cover all possible information. If you have questions about this medicine, talk to your doctor, pharmacist, or health care provider.      2016, Elsevier/[employer] Standard. (2011-08-17 12:37:47)

## 2015-02-22 ENCOUNTER — Other Ambulatory Visit: Payer: Self-pay | Admitting: Internal Medicine

## 2015-02-28 ENCOUNTER — Other Ambulatory Visit: Payer: Self-pay | Admitting: Internal Medicine

## 2015-02-28 NOTE — Telephone Encounter (Signed)
Looks like it was just sent 6 days ok

## 2015-02-28 NOTE — Telephone Encounter (Signed)
Ok to Rf in PCP's absence? Thanks! 

## 2015-02-28 NOTE — Telephone Encounter (Signed)
I called pharmacy- they never received refill that was documented as phoned in on 02/22/2015. I gave verbal auth for #90 with 1 refill per 02/22/15 refill encounter.

## 2015-03-04 ENCOUNTER — Encounter (HOSPITAL_COMMUNITY): Payer: Self-pay

## 2015-03-04 ENCOUNTER — Emergency Department (HOSPITAL_COMMUNITY)
Admission: EM | Admit: 2015-03-04 | Discharge: 2015-03-04 | Disposition: A | Discharge status: Home / Self Care | Payer: PPO | Attending: Emergency Medicine | Admitting: Emergency Medicine

## 2015-03-04 DIAGNOSIS — Z87891 Personal history of nicotine dependence: Secondary | ICD-10-CM | POA: Insufficient documentation

## 2015-03-04 DIAGNOSIS — E785 Hyperlipidemia, unspecified: Secondary | ICD-10-CM | POA: Diagnosis not present

## 2015-03-04 DIAGNOSIS — S61200A Unspecified open wound of right index finger without damage to nail, initial encounter: Secondary | ICD-10-CM | POA: Diagnosis not present

## 2015-03-04 DIAGNOSIS — Z23 Encounter for immunization: Secondary | ICD-10-CM | POA: Insufficient documentation

## 2015-03-04 DIAGNOSIS — Z859 Personal history of malignant neoplasm, unspecified: Secondary | ICD-10-CM | POA: Diagnosis not present

## 2015-03-04 DIAGNOSIS — Z9221 Personal history of antineoplastic chemotherapy: Secondary | ICD-10-CM | POA: Diagnosis not present

## 2015-03-04 DIAGNOSIS — K219 Gastro-esophageal reflux disease without esophagitis: Secondary | ICD-10-CM | POA: Diagnosis not present

## 2015-03-04 DIAGNOSIS — Z853 Personal history of malignant neoplasm of breast: Secondary | ICD-10-CM | POA: Insufficient documentation

## 2015-03-04 DIAGNOSIS — Y9289 Other specified places as the place of occurrence of the external cause: Secondary | ICD-10-CM | POA: Insufficient documentation

## 2015-03-04 DIAGNOSIS — G43909 Migraine, unspecified, not intractable, without status migrainosus: Secondary | ICD-10-CM | POA: Insufficient documentation

## 2015-03-04 DIAGNOSIS — W540XXA Bitten by dog, initial encounter: Secondary | ICD-10-CM | POA: Diagnosis not present

## 2015-03-04 DIAGNOSIS — Y9389 Activity, other specified: Secondary | ICD-10-CM | POA: Diagnosis not present

## 2015-03-04 DIAGNOSIS — Y998 Other external cause status: Secondary | ICD-10-CM | POA: Insufficient documentation

## 2015-03-04 DIAGNOSIS — S61210A Laceration without foreign body of right index finger without damage to nail, initial encounter: Secondary | ICD-10-CM | POA: Diagnosis not present

## 2015-03-04 DIAGNOSIS — Z79899 Other long term (current) drug therapy: Secondary | ICD-10-CM | POA: Diagnosis not present

## 2015-03-04 DIAGNOSIS — M8580 Other specified disorders of bone density and structure, unspecified site: Secondary | ICD-10-CM | POA: Insufficient documentation

## 2015-03-04 DIAGNOSIS — Z923 Personal history of irradiation: Secondary | ICD-10-CM | POA: Diagnosis not present

## 2015-03-04 DIAGNOSIS — S61250A Open bite of right index finger without damage to nail, initial encounter: Secondary | ICD-10-CM | POA: Insufficient documentation

## 2015-03-04 MED ORDER — TETANUS-DIPHTH-ACELL PERTUSSIS 5-2.5-18.5 LF-MCG/0.5 IM SUSP
0.5000 mL | Freq: Once | INTRAMUSCULAR | Status: AC
  Administered 2015-03-04: 0.5 mL via INTRAMUSCULAR
  Filled 2015-03-04: qty 0.5

## 2015-03-04 MED ORDER — RABIES VACCINE, PCEC IM SUSR
1.0000 mL | Freq: Once | INTRAMUSCULAR | Status: AC
  Administered 2015-03-04: 1 mL via INTRAMUSCULAR
  Filled 2015-03-04: qty 1

## 2015-03-04 MED ORDER — AMOXICILLIN-POT CLAVULANATE 875-125 MG PO TABS: 1.0000 | ORAL_TABLET | Freq: Two times a day (BID) | ORAL | Status: DC

## 2015-03-04 MED ORDER — RABIES IMMUNE GLOBULIN 150 UNIT/ML IM INJ
20.0000 [IU]/kg | INJECTION | Freq: Once | INTRAMUSCULAR | Status: AC
  Administered 2015-03-04: 1125 [IU] via INTRAMUSCULAR
  Filled 2015-03-04: qty 8

## 2015-03-04 NOTE — ED Notes (Signed)
Patient here with right hand finger small finger laceration/scratch. Puppy has not had shots yet, no rabies vaccine

## 2015-03-04 NOTE — ED Notes (Signed)
Declined W/C at D/C and was escorted to lobby by RN. 

## 2015-03-04 NOTE — Discharge Instructions (Signed)
1. Medications: augmentin, usual home medications 2. Treatment: rest, drink plenty of fluids 3. Follow Up: please followup for rabies vaccine on days 3, 7, and 14 and with your PCP for discussion of your diagnoses and further evaluation after today's visit; if you do not have a primary care doctor use the resource guide provided to find one; please return to the ER for high fever, severe pain or swelling in your hand, new or worsening symptoms

## 2015-03-04 NOTE — ED Provider Notes (Signed)
CSN: [id number]     Arrival date & time 03/04/15  1114 History  By signing my name below, I, Brittany [name], attest that this documentation has been prepared under the direction and in the presence of Bernerd Limbo, PA-C Electronically Signed: Ladene Artist, ED Scribe 03/04/2015 at 12:07 PM.   Chief Complaint  Patient presents with  . Animal Bite    The history is provided by the patient. No language interpreter was used.    HPI Comments: Brittany [name] is a 70 y.o. female who presents to the Emergency Department complaining of an animal bite to the right hand sustained last night. Pt states that her husband took her 36 week old puppy to a dog park 3 nights ago, at which time the puppy was attacked by another dog and her husband was bitten by several dogs (husband currently undergoing treatment for possible rabies exposure). Pt was then bitten on her right hand last night by her puppy (puppy has not been vaccinated for rabies yet). She reports bleeding from the area last night. No active bleeding at this time. She denies redness and numbness/tingling to the wound. Pt's last tetanus was 2011.  Past Medical History  Diagnosis Date  . Cancer 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 Breast cancer ([place]) 11/23/2007    right, ER/PR -, HER 2 -  . History of radiation therapy 07/25/08 -09/11/08    right breast  . Osteopenia   . Esophageal stricture   . Gastritis 10/2007  . Migraines     rare  . History of chemotherapy     neoadjuvant chemo, last dose 03/2008  . Hyperlipidemia   . Acid reflux    Past Surgical History  Procedure Laterality Date  . Breast lumpectomy  06/26/2008    right, CA   XRT, chemo   Family History  Problem Relation Age of Onset  . Heart disease Father   . Cancer Father     lung  . Cancer Sister 22    Breast & Colorectal  . Epilepsy Mother   . Dementia Mother    Social History  Substance Use Topics  . Smoking status: Former Smoker -- 5  years    Types: Cigarettes    Quit date: 03/03/1967  . Smokeless tobacco: Never Used     Comment: 6 cig per day when she was smoking/quit years ago  . Alcohol Use: 3.6 oz/week    6 Standard drinks or equivalent per week     Comment: 6 beer   OB History    Gravida Para Term Preterm AB TAB SAB Ectopic Multiple Living   3 3        3       Review of Systems  Musculoskeletal: Negative for arthralgias.  Skin: Positive for wound. Negative for color change.  Neurological: Negative for weakness and numbness.   Allergies  Ibandronate sodium; Ibandronic acid; and Lipitor  Home Medications   Prior to Admission medications   Medication Sig Start Date End Date Taking? Authorizing Provider  amoxicillin-clavulanate (AUGMENTIN) 875-125 MG tablet Take 1 tablet by mouth every 12 (twelve) hours. 03/04/15   Marella Chimes, PA-C  ASPIRIN PO Take 81 mg by mouth daily.     Historical Provider, MD  BIOTIN PO Take by mouth daily.    Historical Provider, MD  CALCIUM CITRATE-VITAMIN D PO  05/12/13   Historical Provider, MD  Cholecalciferol (VITAMIN D-3 PO) Take 1 tablet by  mouth daily.     Historical Provider, MD  ciclopirox (PENLAC) 8 % solution Apply topically at bedtime. Apply over nail and surrounding skin. Apply daily over previous coat. After seven (7) days, may remove with alcohol and continue cycle. 09/25/14   Aleksei Plotnikov V, MD  citalopram (CELEXA) 40 MG tablet Take 1 tablet (40 mg total) by mouth daily. 09/25/14   Aleksei Plotnikov V, MD  Cyanocobalamin (VITAMIN B-12 CR PO) Take by mouth.    Historical Provider, MD  diphenhydrAMINE (BENADRYL) 25 MG tablet Take 25 mg by mouth every 6 (six) hours as needed.    Historical Provider, MD  Omega-3 Fatty Acids (FISH OIL) 1000 MG CAPS Take 1 capsule by mouth daily.      Historical Provider, MD  pravastatin (PRAVACHOL) 20 MG tablet Take 1 tablet (20 mg total) by mouth daily. 09/25/14   Aleksei Plotnikov V, MD  ranitidine (ZANTAC) 150 MG tablet Take 1  tablet (150 mg total) by mouth 2 (two) times daily. 09/25/14   Aleksei Plotnikov V, MD  SUMAtriptan (IMITREX) 100 MG tablet Take 1 tablet (100 mg total) by mouth once. May repeat in 2 hours if headache persists or recurs. 09/25/14   Aleksei Plotnikov V, MD  zolpidem (AMBIEN) 10 MG tablet TAKE 1 TAB BY MOUTH NIGHTLY AT BEDTIME AS NEEDED FOR SLEEP. 02/28/15   Aleksei Plotnikov V, MD    BP 121/64 mmHg  Pulse 66  Temp(Src) 98.1 F (36.7 C) (Oral)  Resp 18  Ht 5\' 7"  (1.702 m)  Wt 127 lb 11.2 oz (57.924 kg)  BMI 20.00 kg/m2  SpO2 100%  LMP 03/02/1994 (Approximate) Physical Exam  Constitutional: She is oriented to person, place, and time. She appears well-developed and well-nourished. No distress.  HENT:  Head: Normocephalic and atraumatic.  Right Ear: External ear normal.  Left Ear: External ear normal.  Nose: Nose normal.  Eyes: Conjunctivae and EOM are normal. Right eye exhibits no discharge. Left eye exhibits no discharge. No scleral icterus.  Neck: Normal range of motion. Neck supple.  Cardiovascular: Normal rate and regular rhythm.   Pulmonary/Chest: Effort normal and breath sounds normal. No respiratory distress.  Musculoskeletal: Normal range of motion. She exhibits no edema or tenderness.  Neurological: She is alert and oriented to person, place, and time.  Skin: Skin is warm and dry. She is not diaphoretic.  Small superficial bite wound to the R index finger.   Psychiatric: She has a normal mood and affect. Her behavior is normal.  Nursing note and vitals reviewed.  ED Course  Procedures (including critical care time)  DIAGNOSTIC STUDIES: Oxygen Saturation is 100% on RA, normal by my interpretation.    COORDINATION OF CARE: 12:03 PM-Discussed treatment plan which includes tetanus and rabies series with pt at bedside and pt agreed to plan.   Labs Review Labs Reviewed - No data to display  Imaging Review No results found.    EKG Interpretation None      MDM    Final diagnoses:  Dog bite    70 year old female presents with animal bite to her right hand. She states her puppy bit her hand, and that her husband took the dog to the dog park several days ago, at which time the husband and dog were bitten by other dogs. Her husband is currently undergoing treatment for possible rabies exposure. Patient reports mild pain to her right index finger over the site of her bite, but denies numbness, weakness, or paresthesia.  Patient is afebrile. Vital  signs stable. Small superficial bite wound to right index finger, no active bleeding at this time. No erythema, edema, or significant tenderness to palpation. No signs of infection. Full range of motion of right hand. Strength and sensation intact. Distal pulses intact.  Patient discussed with Dr. Regenia Skeeter, who recommended speaking with patient regarding rabies prophylaxis. Advised likelihood of rabies is low given superificial wound, however patient expresses concern and wants to be treated given the dog bite broke her skin.  Will give tetanus as well as rabies vaccine and immunoglobulin. Will discharge with augmentin. Patient is nontoxic and well-appearing, feel she is stable for discharge at this time. Return precautions discussed. Patient verbalizes her understanding and is in agreement with plan. Patient understands to follow-up for rabies vaccine on days 3, 7, and 14.   BP 125/59 mmHg  Pulse 61  Temp(Src) 98 F (36.7 C) (Oral)  Resp 16  Ht 5\' 7"  (1.702 m)  Wt 57.607 kg  BMI 19.89 kg/m2  SpO2 100%  LMP 03/02/1994 (Approximate)  I personally performed the services described in this documentation, which was scribed in my presence. The recorded information has been reviewed and is accurate.     Marella Chimes, PA-C 03/04/15 1431  Sherwood Gambler, MD 03/05/15 [phone]

## 2015-03-07 ENCOUNTER — Emergency Department (INDEPENDENT_AMBULATORY_CARE_PROVIDER_SITE_OTHER)
Admission: EM | Admit: 2015-03-07 | Discharge: 2015-03-07 | Disposition: A | Discharge status: Home / Self Care | Payer: PPO | Source: Home / Self Care

## 2015-03-07 ENCOUNTER — Encounter (HOSPITAL_COMMUNITY): Payer: Self-pay

## 2015-03-07 DIAGNOSIS — Z23 Encounter for immunization: Secondary | ICD-10-CM | POA: Diagnosis not present

## 2015-03-07 DIAGNOSIS — Z203 Contact with and (suspected) exposure to rabies: Secondary | ICD-10-CM | POA: Diagnosis not present

## 2015-03-07 MED ORDER — RABIES VACCINE, PCEC IM SUSR
INTRAMUSCULAR | Status: AC
  Filled 2015-03-07: qty 1

## 2015-03-07 MED ORDER — RABIES VACCINE, PCEC IM SUSR
1.0000 mL | Freq: Once | INTRAMUSCULAR | Status: AC
  Administered 2015-03-07: 1 mL via INTRAMUSCULAR

## 2015-03-07 NOTE — ED Notes (Signed)
Here for day #3, shot #2 of rabies series. NAD; denies pain

## 2015-03-11 ENCOUNTER — Emergency Department (INDEPENDENT_AMBULATORY_CARE_PROVIDER_SITE_OTHER)
Admission: EM | Admit: 2015-03-11 | Discharge: 2015-03-11 | Disposition: A | Discharge status: Home / Self Care | Payer: PPO | Source: Home / Self Care | Attending: Family Medicine | Admitting: Family Medicine

## 2015-03-11 ENCOUNTER — Encounter (HOSPITAL_COMMUNITY): Payer: Self-pay | Admitting: *Deleted

## 2015-03-11 DIAGNOSIS — Z203 Contact with and (suspected) exposure to rabies: Secondary | ICD-10-CM

## 2015-03-11 MED ORDER — RABIES VACCINE, PCEC IM SUSR
INTRAMUSCULAR | Status: AC
  Filled 2015-03-11: qty 1

## 2015-03-11 MED ORDER — RABIES VACCINE, PCEC IM SUSR
1.0000 mL | Freq: Once | INTRAMUSCULAR | Status: AC
  Administered 2015-03-11: 1 mL via INTRAMUSCULAR

## 2015-03-11 NOTE — ED Notes (Signed)
Pt is here for her rabies shot #3, day 7.  She has no pain or other problems associated with the injections

## 2015-03-11 NOTE — Discharge Instructions (Signed)
Return 03/18/2015 for next rabies shot

## 2015-03-14 DIAGNOSIS — H2513 Age-related nuclear cataract, bilateral: Secondary | ICD-10-CM | POA: Diagnosis not present

## 2015-03-14 DIAGNOSIS — H04123 Dry eye syndrome of bilateral lacrimal glands: Secondary | ICD-10-CM | POA: Diagnosis not present

## 2015-03-14 DIAGNOSIS — H10413 Chronic giant papillary conjunctivitis, bilateral: Secondary | ICD-10-CM | POA: Diagnosis not present

## 2015-03-18 ENCOUNTER — Encounter (HOSPITAL_COMMUNITY): Payer: Self-pay | Admitting: *Deleted

## 2015-03-18 ENCOUNTER — Emergency Department (INDEPENDENT_AMBULATORY_CARE_PROVIDER_SITE_OTHER)
Admission: EM | Admit: 2015-03-18 | Discharge: 2015-03-18 | Disposition: A | Discharge status: Home / Self Care | Payer: PPO | Source: Home / Self Care

## 2015-03-18 DIAGNOSIS — Z203 Contact with and (suspected) exposure to rabies: Secondary | ICD-10-CM | POA: Diagnosis not present

## 2015-03-18 DIAGNOSIS — Z23 Encounter for immunization: Secondary | ICD-10-CM

## 2015-03-18 MED ORDER — RABIES VACCINE, PCEC IM SUSR
1.0000 mL | Freq: Once | INTRAMUSCULAR | Status: AC
  Administered 2015-03-18: 1 mL via INTRAMUSCULAR

## 2015-03-18 MED ORDER — RABIES VACCINE, PCEC IM SUSR
INTRAMUSCULAR | Status: AC
  Filled 2015-03-18: qty 1

## 2015-03-18 NOTE — ED Notes (Signed)
Pt  Reports   She  Is  Here  For  The  Last    In  Her    Series       Of  Rabies      shots

## 2015-03-18 NOTE — Discharge Instructions (Signed)
congrats    You  Are  Finished   Return  As  Needed   °

## 2015-03-21 ENCOUNTER — Other Ambulatory Visit: Payer: Self-pay | Admitting: Internal Medicine

## 2015-05-07 ENCOUNTER — Telehealth: Payer: Self-pay | Admitting: Internal Medicine

## 2015-05-07 DIAGNOSIS — M810 Age-related osteoporosis without current pathological fracture: Secondary | ICD-10-CM

## 2015-05-07 NOTE — Telephone Encounter (Signed)
Pt called request referral for mammogram and bone density to go to [facility]. Pt has an appt 05/13/15. Please help

## 2015-05-10 NOTE — Telephone Encounter (Signed)
d 

## 2015-05-10 NOTE — Telephone Encounter (Signed)
Entered Bone density.Marland KitchenJohny [name]

## 2015-05-10 NOTE — Telephone Encounter (Signed)
Erline Levine can you enter order for bone density scan?

## 2015-05-13 DIAGNOSIS — M8589 Other specified disorders of bone density and structure, multiple sites: Secondary | ICD-10-CM | POA: Diagnosis not present

## 2015-05-13 DIAGNOSIS — M810 Age-related osteoporosis without current pathological fracture: Secondary | ICD-10-CM | POA: Diagnosis not present

## 2015-05-13 DIAGNOSIS — Z853 Personal history of malignant neoplasm of breast: Secondary | ICD-10-CM | POA: Diagnosis not present

## 2015-05-13 DIAGNOSIS — Z1231 Encounter for screening mammogram for malignant neoplasm of breast: Secondary | ICD-10-CM | POA: Diagnosis not present

## 2015-05-13 LAB — HM MAMMOGRAPHY

## 2015-05-17 ENCOUNTER — Encounter: Payer: Self-pay | Admitting: Internal Medicine

## 2015-05-20 ENCOUNTER — Encounter: Payer: Self-pay | Admitting: Internal Medicine

## 2015-05-23 ENCOUNTER — Encounter: Payer: Self-pay | Admitting: Internal Medicine

## 2015-05-23 NOTE — Telephone Encounter (Signed)
Report received and given to MD for review.

## 2015-05-23 NOTE — Telephone Encounter (Signed)
DXA scan report requested from [facility]

## 2015-05-27 ENCOUNTER — Encounter: Payer: Self-pay | Admitting: Obstetrics and Gynecology

## 2015-05-27 ENCOUNTER — Ambulatory Visit (INDEPENDENT_AMBULATORY_CARE_PROVIDER_SITE_OTHER): Payer: PPO | Admitting: Obstetrics and Gynecology

## 2015-05-27 VITALS — BP 110/64 | HR 60 | Resp 18 | Ht 66.0 in | Wt 126.8 lb

## 2015-05-27 DIAGNOSIS — Z113 Encounter for screening for infections with a predominantly sexual mode of transmission: Secondary | ICD-10-CM | POA: Diagnosis not present

## 2015-05-27 DIAGNOSIS — Z01419 Encounter for gynecological examination (general) (routine) without abnormal findings: Secondary | ICD-10-CM

## 2015-05-27 NOTE — Progress Notes (Signed)
Patient ID: Brittany [name], female   DOB: 05/04/1945, 70 y.o.   MRN: [id number] 70 y.o. G3P3 Married Caucasian female here for annual exam.    Wanting to wean off her Pravastatin.  Not having any problem.  Having right knee pain. Has not seen anyone yet about this.  Takes Naproxen occasionally which helps.   PCP: Walker Kehr, MD    Patient's last menstrual period was 03/02/1994 (approximate).          Sexually active: Yes.   female The current method of family planning is post menopausal status.    Exercising: Yes.    Gym/ health club routine includes aerobics and zumba.. Smoker:  Former smoker  Health Maintenance: Pap:  05-18-13 Neg:Neg HR HPV History of abnormal Pap:  no MMG:  05-17-14 3D/Diag.Bil.Density Cat.C/benign vascular calcifications in both breasts/post operative findings in Rt.breast/Neg:[facility].  Patient had in 05/13/2015 with [facility].  BI-RADS 2.  Category C density. Routine mammogram due in one year.  Colonoscopy:  2006 or 2008 normal at [facility](patient unsure of date) BMD:   05/2015  Result  Osteopenia:[facility] TDaP:  03-04-15 Screening Labs:  Hb today: PCP, Urine today: PCP   does not have a smoking history on file. She has never used smokeless tobacco. She reports that she does not drink alcohol or use illicit drugs.  Past Medical History  Diagnosis Date  . Cancer [facility])     breast right  triple recet neg, lumpect, chemo, XRT  . Breast cancer ([place]) 11/23/2007    right, ER/PR -, HER 2 -  . History of radiation therapy 07/25/08 -09/11/08    right breast  . Osteopenia   . Esophageal stricture   . Gastritis 10/2007  . Migraines     rare  . History of chemotherapy     neoadjuvant chemo, last dose 03/2008  . Hyperlipidemia   . Acid reflux     Past Surgical History  Procedure Laterality Date  . Breast lumpectomy  06/26/2008    right, CA   XRT, chemo    Current Outpatient Prescriptions  Medication Sig Dispense Refill  . amoxicillin-clavulanate (AUGMENTIN) 875-125 MG  tablet Take 1 tablet by mouth every 12 (twelve) hours. 14 tablet 0  . ASPIRIN PO Take 81 mg by mouth daily.     [name] BIOTIN PO Take by mouth daily.    [name] CALCIUM CITRATE-VITAMIN D PO     . Cholecalciferol (VITAMIN D-3 PO) Take 1 tablet by mouth daily.     . ciclopirox (PENLAC) 8 % solution Apply topically at bedtime. Apply over nail and surrounding skin. Apply daily over previous coat. After seven (7) days, may remove with alcohol and continue cycle. 6.6 mL 0  . citalopram (CELEXA) 40 MG tablet TAKE 1 TABLET (40 MG TOTAL) BY MOUTH DAILY. 90 tablet 1  . Cyanocobalamin (VITAMIN B-12 CR PO) Take by mouth.    . diphenhydrAMINE (BENADRYL) 25 MG tablet Take 25 mg by mouth every 6 (six) hours as needed.    . Omega-3 Fatty Acids (FISH OIL) 1000 MG CAPS Take 1 capsule by mouth daily.      . pravastatin (PRAVACHOL) 20 MG tablet Take 1 tablet (20 mg total) by mouth daily. (Patient taking differently: Take 20 mg by mouth. Takes 1 tablet every other day) 90 tablet 3  . ranitidine (ZANTAC) 150 MG tablet Take 1 tablet (150 mg total) by mouth 2 (two) times daily. 180 tablet 3  . SUMAtriptan (IMITREX) 100 MG tablet Take 1 tablet (  100 mg total) by mouth once. May repeat in 2 hours if headache persists or recurs. 9 tablet 3  . zolpidem (AMBIEN) 10 MG tablet TAKE 1 TAB BY MOUTH NIGHTLY AT BEDTIME AS NEEDED FOR SLEEP. 90 tablet 1   No current facility-administered medications for this visit.    Family History  Problem Relation Age of Onset  . Heart disease Father   . Cancer Father     lung  . Cancer Sister 15    Breast & Colorectal  . Epilepsy Mother   . Dementia Mother     ROS:  Pertinent items are noted in HPI.  Otherwise, a comprehensive ROS was negative.  Exam:   BP 110/64 mmHg  Pulse 60  Resp 18  Ht 5\' 6"  (1.676 m)  Wt 126 lb 12.8 oz (57.516 kg)  BMI 20.48 kg/m2  LMP 03/02/1994 (Approximate)    General appearance: alert, cooperative and appears stated age Head: Normocephalic, without obvious  abnormality, atraumatic Neck: no adenopathy, supple, symmetrical, trachea midline and thyroid normal to inspection and palpation Lungs: clear to auscultation bilaterally Breasts: normal appearance, no masses or tenderness, Inspection negative, No nipple retraction or dimpling, No nipple discharge or bleeding, No axillary or supraclavicular adenopathy on the left.  Right breast consistent with scarring from surgery and radiation therapy.  No palpable nodes. Heart: regular rate and rhythm Abdomen: soft, non-tender; bowel sounds normal; no masses,  no organomegaly Extremities: extremities normal, atraumatic, no cyanosis or edema on the left.   Skin: Skin color, texture, turgor normal. No rashes or lesions Lymph nodes: Cervical, supraclavicular, and axillary nodes normal. No abnormal inguinal nodes palpated Neurologic: Grossly normal  Pelvic: External genitalia:  no lesions              Urethra:  normal appearing urethra with no masses, tenderness or lesions              Bartholins and Skenes: normal                 Vagina: normal appearing vagina with normal color and discharge, no lesions..Erythema of vagina consistent with atrophy.               Cervix: no lesions              Pap taken: Yes.   Bimanual Exam:  Uterus:  normal size, contour, position, consistency, mobility, non-tender              Adnexa: normal adnexa and no mass, fullness, tenderness              Rectovaginal: Yes.  .  Confirms.              Anus:  normal sphincter tone, no lesions  Chaperone was present for exam.  Assessment:   Well woman visit with normal exam. Hx right breast cancer.  Triple negative. Status post lumpectomy, XRT, and chemo. Osteopenia.  Hyperlipidemia. On Pravastatin.  FH of CAD.  Need for Hep C screening.   Plan: Yearly mammogram recommended after age 60.  Recommended self breast exam.  Pap and HR HPV as above. Discussed Calcium, Vitamin D, regular exercise program including cardiovascular  and weight bearing exercise. Labs performed.  Yes.  .   See orders.  Hep C aby.  Refills given on medications.  No..   Discussed lubricants for vaginal dryness - water based, oil, and vit E suppositories. Follow up annually and prn.   Additional counseling given regarding treatment of elevated  cholesterol. I recommend patient continue with medication. She also has family history of CAD.  If she wishes to stop medication, she will see her PCP first for discussion or risks and benefits.  After visit summary provided.

## 2015-05-27 NOTE — Patient Instructions (Signed)

## 2015-05-28 LAB — HEPATITIS C ANTIBODY: HCV Ab: NEGATIVE

## 2015-05-29 LAB — IPS PAP SMEAR ONLY

## 2015-06-26 ENCOUNTER — Telehealth: Payer: Self-pay | Admitting: *Deleted

## 2015-06-26 ENCOUNTER — Other Ambulatory Visit: Payer: Commercial Managed Care - HMO

## 2015-06-26 ENCOUNTER — Encounter: Payer: Commercial Managed Care - HMO | Admitting: Nurse Practitioner

## 2015-07-02 ENCOUNTER — Other Ambulatory Visit: Payer: Self-pay | Admitting: Nurse Practitioner

## 2015-07-02 DIAGNOSIS — C50911 Malignant neoplasm of unspecified site of right female breast: Secondary | ICD-10-CM

## 2015-07-02 DIAGNOSIS — M810 Age-related osteoporosis without current pathological fracture: Secondary | ICD-10-CM

## 2015-07-02 MED ORDER — DENOSUMAB 60 MG/ML ~~LOC~~ SOLN
60.0000 mg | Freq: Once | SUBCUTANEOUS | Status: DC
Start: 2015-08-14 — End: 2020-07-25

## 2015-07-03 ENCOUNTER — Telehealth: Payer: Self-pay | Admitting: Nurse Practitioner

## 2015-07-03 NOTE — Telephone Encounter (Signed)
LEFT MSG FOR 6/14 APPT

## 2015-07-09 ENCOUNTER — Encounter: Payer: Self-pay | Admitting: Nurse Practitioner

## 2015-07-09 NOTE — Progress Notes (Signed)
Sent prior auth for prolia to [facility]

## 2015-07-11 ENCOUNTER — Encounter: Payer: Self-pay | Admitting: Nurse Practitioner

## 2015-07-11 NOTE — Progress Notes (Signed)
Per [facility] prolia approved 07/09/15-03/01/16. I sent to medical records

## 2015-07-16 NOTE — Telephone Encounter (Signed)
err

## 2015-07-31 ENCOUNTER — Encounter: Payer: Self-pay | Admitting: Internal Medicine

## 2015-07-31 ENCOUNTER — Ambulatory Visit: Payer: PPO | Admitting: Internal Medicine

## 2015-07-31 ENCOUNTER — Other Ambulatory Visit (INDEPENDENT_AMBULATORY_CARE_PROVIDER_SITE_OTHER): Payer: PPO

## 2015-07-31 ENCOUNTER — Ambulatory Visit (INDEPENDENT_AMBULATORY_CARE_PROVIDER_SITE_OTHER)
Admission: RE | Admit: 2015-07-31 | Discharge: 2015-07-31 | Disposition: A | Discharge status: Home / Self Care | Payer: PPO | Source: Ambulatory Visit | Attending: Internal Medicine | Admitting: Internal Medicine

## 2015-07-31 ENCOUNTER — Ambulatory Visit (INDEPENDENT_AMBULATORY_CARE_PROVIDER_SITE_OTHER): Payer: PPO | Admitting: Internal Medicine

## 2015-07-31 VITALS — BP 110/70 | HR 87 | Wt 127.0 lb

## 2015-07-31 DIAGNOSIS — E785 Hyperlipidemia, unspecified: Secondary | ICD-10-CM

## 2015-07-31 DIAGNOSIS — M25561 Pain in right knee: Secondary | ICD-10-CM

## 2015-07-31 DIAGNOSIS — E538 Deficiency of other specified B group vitamins: Secondary | ICD-10-CM | POA: Diagnosis not present

## 2015-07-31 DIAGNOSIS — Z23 Encounter for immunization: Secondary | ICD-10-CM

## 2015-07-31 DIAGNOSIS — G4700 Insomnia, unspecified: Secondary | ICD-10-CM

## 2015-07-31 DIAGNOSIS — F411 Generalized anxiety disorder: Secondary | ICD-10-CM

## 2015-07-31 DIAGNOSIS — M179 Osteoarthritis of knee, unspecified: Secondary | ICD-10-CM | POA: Diagnosis not present

## 2015-07-31 LAB — BASIC METABOLIC PANEL
BUN: 10 mg/dL (ref 6–23)
CHLORIDE: 100 meq/L (ref 96–112)
CO2: 30 meq/L (ref 19–32)
CREATININE: 0.64 mg/dL (ref 0.40–1.20)
Calcium: 8.9 mg/dL (ref 8.4–10.5)
GFR: 98.11 mL/min (ref >60.00)
Glucose, Bld: 95 mg/dL (ref 70–99)
Potassium: 3.7 mEq/L (ref 3.5–5.1)
Sodium: 134 mEq/L — ABNORMAL LOW (ref 135–145)

## 2015-07-31 LAB — HEPATIC FUNCTION PANEL
ALK PHOS: 55 U/L (ref 39–117)
ALT: 7 U/L (ref 0–35)
AST: 14 U/L (ref 0–37)
Albumin: 4.1 g/dL (ref 3.5–5.2)
BILIRUBIN DIRECT: 0.1 mg/dL (ref 0.0–0.3)
TOTAL PROTEIN: 6.7 g/dL (ref 6.0–8.3)
Total Bilirubin: 0.4 mg/dL (ref 0.2–1.2)

## 2015-07-31 LAB — LIPID PANEL
CHOL/HDL RATIO: 3
Cholesterol: 163 mg/dL (ref 0–200)
HDL: 63.3 mg/dL (ref >39.00)
LDL Cholesterol: 85 mg/dL (ref 0–99)
NONHDL: 99.35
Triglycerides: 70 mg/dL (ref 0.0–149.0)
VLDL: 14 mg/dL (ref 0.0–40.0)

## 2015-07-31 LAB — SEDIMENTATION RATE: SED RATE: 23 mm/h (ref 0–30)

## 2015-07-31 LAB — URIC ACID: URIC ACID, SERUM: 2.7 mg/dL (ref 2.4–7.0)

## 2015-07-31 MED ORDER — PRAVASTATIN SODIUM 20 MG PO TABS: 20.0000 mg | ORAL_TABLET | Freq: Every day | ORAL | Status: DC

## 2015-07-31 MED ORDER — CITALOPRAM HYDROBROMIDE 40 MG PO TABS: ORAL_TABLET | ORAL | Status: DC

## 2015-07-31 MED ORDER — MELOXICAM 15 MG PO TABS: 15.0000 mg | ORAL_TABLET | Freq: Every day | ORAL | Status: DC | PRN

## 2015-07-31 MED ORDER — SUMATRIPTAN SUCCINATE 100 MG PO TABS: 100.0000 mg | ORAL_TABLET | Freq: Once | ORAL | Status: DC

## 2015-07-31 MED ORDER — RANITIDINE HCL 150 MG PO TABS: 150.0000 mg | ORAL_TABLET | Freq: Two times a day (BID) | ORAL | Status: DC

## 2015-07-31 MED ORDER — ZOLPIDEM TARTRATE 10 MG PO TABS: ORAL_TABLET | ORAL | Status: DC

## 2015-07-31 MED ORDER — ZOLPIDEM TARTRATE 10 MG PO TABS
ORAL_TABLET | ORAL | Status: DC
Start: 2015-07-31 — End: 2016-01-17

## 2015-07-31 NOTE — Assessment & Plan Note (Signed)
5/17 on prn Zolpidem  Potential benefits of a long term benzodiazepines  use as well as potential risks  and complications were explained to the patient and were aknowledged.

## 2015-07-31 NOTE — Assessment & Plan Note (Signed)
On B12 

## 2015-07-31 NOTE — Assessment & Plan Note (Addendum)
On Pravastatin Labs

## 2015-07-31 NOTE — Assessment & Plan Note (Signed)
Citalopram 

## 2015-07-31 NOTE — Progress Notes (Signed)
Subjective:  Patient ID: Brittany [name], female    DOB: [birth year]  Age: 70 y.o. MRN: [id number]  CC: Knee Pain   HPI Brittany [name] presents for R knee x3 mo. Naproxen, Vicodin did not help. F/u insomnia, anxiety  Outpatient Prescriptions Prior to Visit  Medication Sig Dispense Refill  . ASPIRIN PO Take 81 mg by mouth daily.     [name] BIOTIN PO Take by mouth daily.    [name] CALCIUM CITRATE-VITAMIN D PO     . Cholecalciferol (VITAMIN D-3 PO) Take 1 tablet by mouth daily.     . citalopram (CELEXA) 40 MG tablet TAKE 1 TABLET (40 MG TOTAL) BY MOUTH DAILY. 90 tablet 1  . Cyanocobalamin (VITAMIN B-12 CR PO) Take by mouth.    Derrill Memo ON 08/14/2015] denosumab (PROLIA) 60 MG/ML SOLN injection Inject 60 mg into the skin once. Administer in upper arm, thigh, or abdomen 1.8 mL 1  . diphenhydrAMINE (BENADRYL) 25 MG tablet Take 25 mg by mouth every 6 (six) hours as needed.    . Omega-3 Fatty Acids (FISH OIL) 1000 MG CAPS Take 1 capsule by mouth daily.      . pravastatin (PRAVACHOL) 20 MG tablet Take 1 tablet (20 mg total) by mouth daily. (Patient taking differently: Take 20 mg by mouth. Takes 1 tablet every other day) 90 tablet 3  . ranitidine (ZANTAC) 150 MG tablet Take 1 tablet (150 mg total) by mouth 2 (two) times daily. 180 tablet 3  . SUMAtriptan (IMITREX) 100 MG tablet Take 1 tablet (100 mg total) by mouth once. May repeat in 2 hours if headache persists or recurs. 9 tablet 3  . zolpidem (AMBIEN) 10 MG tablet TAKE 1 TAB BY MOUTH NIGHTLY AT BEDTIME AS NEEDED FOR SLEEP. 90 tablet 1  . ciclopirox (PENLAC) 8 % solution Apply topically at bedtime. Apply over nail and surrounding skin. Apply daily over previous coat. After seven (7) days, may remove with alcohol and continue cycle. (Patient not taking: Reported on 07/31/2015) 6.6 mL 0  . amoxicillin-clavulanate (AUGMENTIN) 875-125 MG tablet Take 1 tablet by mouth every 12 (twelve) hours. (Patient not taking: Reported on 07/31/2015) 14 tablet 0   No  facility-administered medications prior to visit.    ROS Review of Systems  Constitutional: Negative for chills, activity change, appetite change, fatigue and unexpected weight change.  HENT: Negative for congestion, mouth sores and sinus pressure.   Eyes: Negative for visual disturbance.  Respiratory: Negative for cough and chest tightness.   Gastrointestinal: Negative for nausea and abdominal pain.  Genitourinary: Negative for frequency, difficulty urinating and vaginal pain.  Musculoskeletal: Positive for gait problem. Negative for back pain and arthralgias.  Skin: Negative for pallor and rash.  Neurological: Negative for dizziness, tremors, weakness, numbness and headaches.  Psychiatric/Behavioral: Negative for confusion and sleep disturbance. The patient is not nervous/anxious.     Objective:  BP 110/70 mmHg  Pulse 87  Wt 127 lb (57.607 kg)  SpO2 97%  LMP 03/02/1994 (Approximate)  BP Readings from Last 3 Encounters:  07/31/15 110/70  05/27/15 110/64  03/18/15 102/68    Wt Readings from Last 3 Encounters:  07/31/15 127 lb (57.607 kg)  05/27/15 126 lb 12.8 oz (57.516 kg)  03/04/15 127 lb (57.607 kg)    Physical Exam  Constitutional: She appears well-developed. No distress.  HENT:  Head: Normocephalic.  Right Ear: External ear normal.  Left Ear: External ear normal.  Nose: Nose normal.  Mouth/Throat: Oropharynx is clear and moist.  Eyes: Conjunctivae are normal. Pupils are equal, round, and reactive to light. Right eye exhibits no discharge. Left eye exhibits no discharge.  Neck: Normal range of motion. Neck supple. No JVD present. No tracheal deviation present. No thyromegaly present.  Cardiovascular: Normal rate, regular rhythm and normal heart sounds.   Pulmonary/Chest: No stridor. No respiratory distress. She has no wheezes.  Abdominal: Soft. Bowel sounds are normal. She exhibits no distension and no mass. There is no tenderness. There is no rebound and no  guarding.  Musculoskeletal: She exhibits tenderness. She exhibits no edema.  Lymphadenopathy:    She has no cervical adenopathy.  Neurological: She displays normal reflexes. No cranial nerve deficit. She exhibits normal muscle tone. Coordination normal.  Skin: No rash noted. No erythema.  Psychiatric: She has a normal mood and affect. Her behavior is normal. Judgment and thought content normal.  R knee is sensitive w/ROM  Lab Results  Component Value Date   WBC 6.6 03/12/2014   HGB 13.6 03/12/2014   HCT 41.3 03/12/2014   PLT 292 03/12/2014   GLUCOSE 111 02/12/2015   CHOL 156 05/08/2014   TRIG 61.0 05/08/2014   HDL 58.60 05/08/2014   LDLDIRECT 178.9 03/23/2013   LDLCALC 85 05/08/2014   ALT 11 02/12/2015   AST 16 02/12/2015   NA 137 02/12/2015   K 3.5 02/12/2015   CL 98 03/21/2012   CREATININE 0.8 02/12/2015   BUN 10.5 02/12/2015   CO2 29 02/12/2015   TSH 1.41 03/23/2013    No results found.  Assessment & Plan:   There are no diagnoses linked to this encounter. I have discontinued Ms. Righetti's amoxicillin-clavulanate. I am also having her maintain her Cholecalciferol (VITAMIN D-3 PO), ASPIRIN PO, Fish Oil, Cyanocobalamin (VITAMIN B-12 CR PO), BIOTIN PO, diphenhydrAMINE, CALCIUM CITRATE-VITAMIN D PO, pravastatin, ranitidine, SUMAtriptan, ciclopirox, zolpidem, citalopram, and denosumab.  No orders of the defined types were placed in this encounter.     Follow-up: No Follow-up on file.  Walker Kehr, MD

## 2015-07-31 NOTE — Assessment & Plan Note (Signed)
?  OA vs other X ray Meloxicam qd Sports cream RTC 3 wks

## 2015-07-31 NOTE — Progress Notes (Signed)
Pre visit review using our clinic review tool, if applicable. No additional management support is needed unless otherwise documented below in the visit note. 

## 2015-08-14 ENCOUNTER — Ambulatory Visit (HOSPITAL_BASED_OUTPATIENT_CLINIC_OR_DEPARTMENT_OTHER): Payer: PPO

## 2015-08-14 ENCOUNTER — Ambulatory Visit (HOSPITAL_BASED_OUTPATIENT_CLINIC_OR_DEPARTMENT_OTHER): Payer: PPO | Admitting: Nurse Practitioner

## 2015-08-14 ENCOUNTER — Other Ambulatory Visit (HOSPITAL_BASED_OUTPATIENT_CLINIC_OR_DEPARTMENT_OTHER): Payer: PPO

## 2015-08-14 ENCOUNTER — Encounter: Payer: Self-pay | Admitting: Nurse Practitioner

## 2015-08-14 ENCOUNTER — Telehealth: Payer: Self-pay | Admitting: Oncology

## 2015-08-14 VITALS — BP 100/38 | HR 85 | Temp 98.6°F | Resp 18 | Ht 66.0 in | Wt 127.1 lb

## 2015-08-14 DIAGNOSIS — C50911 Malignant neoplasm of unspecified site of right female breast: Secondary | ICD-10-CM

## 2015-08-14 DIAGNOSIS — M810 Age-related osteoporosis without current pathological fracture: Secondary | ICD-10-CM

## 2015-08-14 DIAGNOSIS — D649 Anemia, unspecified: Secondary | ICD-10-CM

## 2015-08-14 DIAGNOSIS — Z853 Personal history of malignant neoplasm of breast: Secondary | ICD-10-CM | POA: Diagnosis not present

## 2015-08-14 LAB — CBC WITH DIFFERENTIAL/PLATELET
BASO%: 0.4 % (ref 0.0–2.0)
Basophils Absolute: 0 10*3/uL (ref 0.0–0.1)
EOS ABS: 0.2 10*3/uL (ref 0.0–0.5)
EOS%: 3.2 % (ref 0.0–7.0)
HEMATOCRIT: 32.2 % — AB (ref 34.8–46.6)
HGB: 10.6 g/dL — ABNORMAL LOW (ref 11.6–15.9)
LYMPH#: 1.6 10*3/uL (ref 0.9–3.3)
LYMPH%: 21.8 % (ref 14.0–49.7)
MCH: 30.1 pg (ref 25.1–34.0)
MCHC: 32.9 g/dL (ref 31.5–36.0)
MCV: 91.5 fL (ref 79.5–101.0)
MONO#: 0.5 10*3/uL (ref 0.1–0.9)
MONO%: 6.6 % (ref 0.0–14.0)
NEUT%: 68 % (ref 38.4–76.8)
NEUTROS ABS: 4.9 10*3/uL (ref 1.5–6.5)
PLATELETS: 226 10*3/uL (ref 145–400)
RBC: 3.52 10*6/uL — AB (ref 3.70–5.45)
RDW: 13.6 % (ref 11.2–14.5)
WBC: 7.2 10*3/uL (ref 3.9–10.3)

## 2015-08-14 LAB — COMPREHENSIVE METABOLIC PANEL
ALT: 12 U/L (ref 0–55)
AST: 17 U/L (ref 5–34)
Albumin: 3.7 g/dL (ref 3.5–5.0)
Alkaline Phosphatase: 49 U/L (ref 40–150)
Anion Gap: 8 mEq/L (ref 3–11)
BUN: 13.2 mg/dL (ref 7.0–26.0)
CALCIUM: 9.3 mg/dL (ref 8.4–10.4)
CHLORIDE: 102 meq/L (ref 98–109)
CO2: 28 mEq/L (ref 22–29)
Creatinine: 0.8 mg/dL (ref 0.6–1.1)
EGFR: 76 mL/min/{1.73_m2} — ABNORMAL LOW (ref >90)
GLUCOSE: 106 mg/dL (ref 70–140)
POTASSIUM: 4 meq/L (ref 3.5–5.1)
SODIUM: 138 meq/L (ref 136–145)
Total Bilirubin: 0.37 mg/dL (ref 0.20–1.20)
Total Protein: 6.8 g/dL (ref 6.4–8.3)

## 2015-08-14 MED ORDER — DENOSUMAB 60 MG/ML ~~LOC~~ SOLN
60.0000 mg | Freq: Once | SUBCUTANEOUS | Status: AC
  Administered 2015-08-14: 60 mg via SUBCUTANEOUS
  Filled 2015-08-14: qty 1

## 2015-08-14 NOTE — Progress Notes (Signed)
CLINIC:  Cancer Survivorship   REASON FOR VISIT:  Routine follow-up post-treatment for history of breast cancer.  BRIEF ONCOLOGIC HISTORY:    Breast cancer, right breast ([place])   11/22/2007 Breast US Right breast with irregular, hypoechoic mass measuring up to 1.9 cm. Coorelated with palpable finding.  LNs appeared normal.     11/22/2007 Mammogram Right breast with suspicious 1.5 cm ill-defined density with associated microcalcs.   11/23/2007 Initial Biopsy Right breast biopsy revealed grade 2-3 IDC. ER- (0%)/PR- (0%)/HER2 neg.  Ki76 50%.    11/23/2007 Initial Diagnosis T1cN0, Stage IA right breast invasive ductal carcinoma   12/08/2007 Breast MRI Solitary abnormally enhancing right breast mass at 8 o'clock location, corresponding with bx-proven IDC. No MRI evidence of multifocal/multicentric or contralateral disease.    12/15/2007 Echocardiogram Pre-treatment EF: 60%.    12/27/2007 -  Neo-Adjuvant Chemotherapy Docetaxel/Gemcitabine/Bevacizumab x 4 cycles,  then Doxorubicin/Cytoxan x 4 cycles with Bevacizumab x 2 cycles per NSABP Protocol B-40.  Pt received last Bevacizumab dose in 03/2008.   06/12/2008 Echocardiogram EF normal: 65-70%   06/26/2008 Definitive Surgery Right lumpectomy with right axillary SLNB revealed no residual carcinoma and complete pathologic response.  Margins neg.  0/2 SLN positive for metastatic carcinoma.    06/26/2008 Pathologic Stage ypT0 ypN0(i-): No evidence of disease.     - 09/11/2008 Radiation Therapy Completed right breast radiation therapy. Total dose 61 Gy.  Completed 08/2008.   06/13/2009 Echocardiogram EF normal: 55-60%.    12/2011 Miscellaneous Began osteoporosis treatment with Zoledronate.  Completed therapy in 10/2012.    05/26/2013 Imaging DEXA scan: T-score -2.3, low bone mass. Improvement in BMD of lumbar spine since last scan 05/2011.  Recs to repeat in 2 years.    11/30/2013 Miscellaneous Began osteoporosis treatment with Prolia/Denosumab.  Receives infusions  every 6 months.    02/09/2014 Echocardiogram Echocardiographic Stress Test: negative with recommendations to repeat in 1 year per cardiology specialists.    06/29/2014 Survivorship Survivorship Care Plan mailed to patient at her request in lieu of an in-person meeting in [facility].     INTERVAL HISTORY:  Brittany [name] presents to the Survivorship Clinic today for ongoing follow up regarding her history of breast cancer. Overall, Brittany [name] reports feeling doing well since her last visit with Dr. Jana Hakim in January 2016. She has not noticed any change within her breast and her last mammogram was in March 2017 and unremarkable. She denies any headache, cough, or bone pain other than generalized aches / pains related to her arthritis, which are unchanged. She reports a good appetite and denies any weight loss.  She reports that she donated blood yesterday and that her blood pressure was slightly low prior to donation.  She denies any dizziness or shortness of breath.  She was able to walk up the hill from the parking lot to the cancer center without any symptoms.  She was bitten by a dog earlier this year and underwent rabies shots. She denies any nosebleeds, hematuria, dark / bloody stools, abdominal pain, or hematemesis.     REVIEW OF SYSTEMS:  General: Denies fever, chills, unintentional weight loss, or generalized fatigue.  HEENT: Wears glasses. Denies visual changes, hearing loss, mouth sores, or difficulty swallowing. Cardiac: Denies palpitations and lower extremity edema.  Respiratory: As above. Breast: As above.  GI: Denies abdominal pain, constipation, diarrhea, nausea, or vomiting.  GU: Denies dysuria, hematuria, vaginal bleeding, vaginal discharge, or vaginal dryness.  Musculoskeletal: As above. Neuro: Denies recent fall or numbness / tingling  in her extremities.  Skin: Denies rash, pruritis, or open wounds.  Psych: Denies depression, anxiety, insomnia, or memory loss.   A 14-point  review of systems was completed and was negative, except as noted above.   ONCOLOGY TREATMENT TEAM:  1. Surgeon:  Dr. Margot Chimes (retired) at [facility]  2. Medical Oncologist: Dr. Jana Hakim 3. Radiation Oncologist: Dr. Pablo Ledger    PAST MEDICAL/SURGICAL HISTORY:  Past Medical History  Diagnosis Date  . Cancer [facility])     breast right  triple recet neg, lumpect, chemo, XRT  . Breast cancer ([place]) 11/23/2007    right, ER/PR -, HER 2 -  . History of radiation therapy 07/25/08 -09/11/08    right breast  . Osteopenia   . Esophageal stricture   . Gastritis 10/2007  . Migraines     rare  . History of chemotherapy     neoadjuvant chemo, last dose 03/2008  . Hyperlipidemia   . Acid reflux    Past Surgical History  Procedure Laterality Date  . Breast lumpectomy  06/26/2008    right, CA   XRT, chemo     ALLERGIES:  Allergies  Allergen Reactions  . Ibandronate Sodium     REACTION: intolerance--BONIVA  . Ibandronic Acid Nausea Only  . Lipitor [Atorvastatin]     arthralgias     CURRENT MEDICATIONS:  Current Outpatient Prescriptions on File Prior to Visit  Medication Sig Dispense Refill  . ASPIRIN PO Take 81 mg by mouth daily.     [name] BIOTIN PO Take by mouth daily.    [name] CALCIUM CITRATE-VITAMIN D PO     . Cholecalciferol (VITAMIN D-3 PO) Take 1 tablet by mouth daily.     . citalopram (CELEXA) 40 MG tablet TAKE 1 TABLET (40 MG TOTAL) BY MOUTH DAILY. 90 tablet 1  . Cyanocobalamin (VITAMIN B-12 CR PO) Take by mouth.    . diphenhydrAMINE (BENADRYL) 25 MG tablet Take 25 mg by mouth every 6 (six) hours as needed.    . meloxicam (MOBIC) 15 MG tablet Take 1 tablet (15 mg total) by mouth daily as needed for pain. 30 tablet 3  . Omega-3 Fatty Acids (FISH OIL) 1000 MG CAPS Take 1 capsule by mouth daily.      . pravastatin (PRAVACHOL) 20 MG tablet Take 1 tablet (20 mg total) by mouth daily. 90 tablet 3  . ranitidine (ZANTAC) 150 MG tablet Take 1 tablet (150 mg total) by mouth 2 (two)  times daily. 180 tablet 3  . SUMAtriptan (IMITREX) 100 MG tablet Take 1 tablet (100 mg total) by mouth once. May repeat in 2 hours if headache persists or recurs. 9 tablet 3  . zolpidem (AMBIEN) 10 MG tablet TAKE 1 TAB BY MOUTH NIGHTLY AT BEDTIME AS NEEDED FOR SLEEP. 90 tablet 1  . ciclopirox (PENLAC) 8 % solution Apply topically at bedtime. Apply over nail and surrounding skin. Apply daily over previous coat. After seven (7) days, may remove with alcohol and continue cycle. (Patient not taking: Reported on 07/31/2015) 6.6 mL 0  . denosumab (PROLIA) 60 MG/ML SOLN injection Inject 60 mg into the skin once. Administer in upper arm, thigh, or abdomen (Patient not taking: Reported on 08/14/2015) 1.8 mL 1   No current facility-administered medications on file prior to visit.     ONCOLOGIC FAMILY HISTORY:  Family History  Problem Relation Age of Onset  . Heart disease Father   . Cancer Father     lung  . Cancer Sister 25  Breast & Colorectal  . Epilepsy Mother   . Dementia Mother      GENETIC COUNSELING/TESTING: No   SOCIAL HISTORY:  Brittany [name] is married and lives with her family in [place], [place].  She has 2 children. Brittany [name] is currently retired.  She is a former smoker (quit in 1969) and denies any current or history of alcohol or illicit drug use.    HEALTH MAINTENANCE: PAP: 05/2015 Bone density: 05/2015  PHYSICAL EXAMINATION:  Vital Signs: Filed Vitals:   08/14/15 1329  BP: 100/38  Pulse: 85  Temp: 98.6 F (37 C)  Resp: 18   Weight: 127.1 (decrease 0.9 lb since 03/2014) ECOG performance status: 0 General: Well-nourished, well-appearing female in no acute distress.  She is  unaccompanied in clinic today.   HEENT: Head is atraumatic and normocephalic.  Pupils equal and reactive to light and accomodation. Conjunctivae clear without exudate.  Sclerae anicteric. Oral mucosa is pink, moist, and intact without lesions.  Oropharynx is pink without lesions or  erythema.  Lymph: No cervical, supraclavicular, infraclavicular, or axillary lymphadenopathy noted on palpation.  Cardiovascular: Regular rate and rhythm without murmurs, rubs, or gallops. Respiratory: Clear to auscultation bilaterally. Chest expansion symmetric without accessory muscle use on inspiration or expiration.  Breast: Bilateral breast exam performed.  Right lumpectomy scar intact without nodularity.  No mass or lesion in either breast. GI: Abdomen soft and round. No tenderness to palpation. Bowel sounds normoactive in 4 quadrants. No hepatosplenomegaly.   GU: Deferred.  Musculoskeletal: Muscle strength 5/5 in all extremities.   Neuro: No focal deficits. Steady gait.  Psych: Mood and affect normal and appropriate for situation.  Extremities: No edema, cyanosis, or clubbing.  Skin: Warm and dry. No open lesions noted.   LABORATORY DATA:  Recent Results (from the past 2160 hour(s))  PAP Smear Only (IPS)     Status: None   Collection Time: 05/27/15  2:15 PM  Result Value Ref Range   COMMENTS: [facility]     Comment: [place], [place] [address] [place] [address] GYN CYTOLOGY REPORT  PATIENT NAME:Schild, Lugene D PATHOLOGY#:[id number]: F DOB: February 11, 1946 (Age: 45) MEDICAL RECORD MMHWKG:[id number] DOCTOR:Brook Quincy Simmonds, MD DATE OBTAINED:3/27/2017CLIENT:[facility] DATE RECEIVED:3/28/2017OTHER PHYS: DATE SIGNED:05/29/2015 PAP- ThinLayer Final Cytologic Interpretation:       Cervical, ThinLayer with Automated Imaging and Dual Review, CPT 88175      Negative for Intraepithelial Lesions or Malignancy.       ADEQUACY OF SPECIMEN:           Satisfactory for evaluation. The presence or absence of endocervical cells/transformation zone component cannot be determined due to atrophy.              OTHER CYTOLOGIC FINDINGS:            Marked/Severe Inflammation.       NOTE: This Pap test has been evaluated with computer  assisted technology.       Electronically signed by: PAL, CT(ASCP), [id number], [place], [place], (Med. Dir.: Sandrea Hughs,  MD) pal/3/29/2017The Pap test is a screening mechanism with excellent but not perfect ability to prevent cervical carcinoma.  It has a low, but  significant, diagnostic error rate. The pap test is suboptimal  for detection of glandular lesions.  It should be noted that a negative result does not definitively rule out the presence of disease.Ref: DeMay, RM, The Art and Science of Cytopathology,  [employer], [phone].  Last Menstrual Period: 03/02/1994  Technical processing performed at [facility], [address] [place], [place] [address], [place].   Hepatitis C antibody     Status: None   Collection Time: 05/27/15  2:35 PM  Result Value Ref Range   HCV Ab NEGATIVE NEGATIVE  Uric acid     Status: None   Collection Time: 07/31/15 11:53 AM  Result Value Ref Range   Uric Acid, Serum 2.7 2.4 - 7.0 mg/dL  Basic metabolic panel     Status: Abnormal   Collection Time: 07/31/15 11:53 AM  Result Value Ref Range   Sodium 134 (L) 135 - 145 mEq/L   Potassium 3.7 3.5 - 5.1 mEq/L   Chloride 100 96 - 112 mEq/L   CO2 30 19 - 32 mEq/L   Glucose, Bld 95 70 - 99 mg/dL   BUN 10 6 - 23 mg/dL   Creatinine, Ser 0.64 0.40 - 1.20 mg/dL   Calcium 8.9 8.4 - 10.5 mg/dL   GFR 98.11 >60.00 mL/min  Hepatic function panel     Status: None   Collection Time: 07/31/15 11:53 AM  Result Value Ref Range   Total Bilirubin 0.4 0.2 - 1.2 mg/dL   Bilirubin, Direct 0.1 0.0 - 0.3 mg/dL   Alkaline Phosphatase 55 39 - 117 U/L   AST 14 0 - 37 U/L   ALT 7 0 - 35 U/L   Total Protein 6.7 6.0 - 8.3 g/dL   Albumin 4.1 3.5 - 5.2 g/dL  Lipid panel     Status: None   Collection Time: 07/31/15 11:53 AM  Result Value Ref Range   Cholesterol 163 0 - 200 mg/dL    Comment: ATP III Classification       Desirable:  < 200 mg/dL               Borderline High:  200 - 239 mg/dL           High:  > = 240 mg/dL   Triglycerides 70.0 0.0 - 149.0 mg/dL    Comment: Normal:  <150 mg/dLBorderline High:  150 - 199 mg/dL   HDL 63.30 >39.00 mg/dL   VLDL 14.0 0.0 - 40.0 mg/dL   LDL Cholesterol 85 0 - 99 mg/dL   Total CHOL/HDL Ratio 3     Comment:                Men          Women1/2 Average Risk     3.4          3.3Average Risk          5.0          4.42X Average Risk          9.6          7.13X Average Risk          15.0          11.0                       NonHDL 99.35     Comment: NOTE:  Non-HDL goal should be 30 mg/dL higher than patient's LDL goal (i.e. LDL goal of < 70 mg/dL, would have non-HDL goal of < 100 mg/dL)  Sedimentation rate     Status: None   Collection Time: 07/31/15 11:53 AM  Result Value Ref Range   Sed Rate 23 0 - 30 mm/hr  Comprehensive metabolic panel  Status: Abnormal   Collection Time: 08/14/15  1:03 PM  Result Value Ref Range   Sodium 138 136 - 145 mEq/L   Potassium 4.0 3.5 - 5.1 mEq/L   Chloride 102 98 - 109 mEq/L   CO2 28 22 - 29 mEq/L   Glucose 106 70 - 140 mg/dl    Comment: Glucose reference range is for nonfasting patients. Fasting glucose reference range is 70- 100.   BUN 13.2 7.0 - 26.0 mg/dL   Creatinine 0.8 0.6 - 1.1 mg/dL   Total Bilirubin 0.37 0.20 - 1.20 mg/dL   Alkaline Phosphatase 49 40 - 150 U/L   AST 17 5 - 34 U/L   ALT 12 0 - 55 U/L   Total Protein 6.8 6.4 - 8.3 g/dL   Albumin 3.7 3.5 - 5.0 g/dL   Calcium 9.3 8.4 - 10.4 mg/dL   Anion Gap 8 3 - 11 mEq/L   EGFR 76 (L) >90 ml/min/1.73 m2    Comment: eGFR is calculated using the CKD-EPI Creatinine Equation (2009)  CBC with Differential     Status: Abnormal   Collection Time: 08/14/15  1:03 PM  Result Value Ref Range   WBC 7.2 3.9 - 10.3 10e3/uL   NEUT# 4.9 1.5 - 6.5 10e3/uL   HGB 10.6 (L) 11.6 - 15.9 g/dL   HCT 32.2 (L) 34.8 - 46.6 %   Platelets 226 145 - 400 10e3/uL   MCV 91.5 79.5 - 101.0 fL   MCH 30.1 25.1 - 34.0 pg   MCHC 32.9 31.5 - 36.0 g/dL   RBC 3.52 (L) 3.70 - 5.45  10e6/uL   RDW 13.6 11.2 - 14.5 %   lymph# 1.6 0.9 - 3.3 10e3/uL   MONO# 0.5 0.1 - 0.9 10e3/uL   Eosinophils Absolute 0.2 0.0 - 0.5 10e3/uL   Basophils Absolute 0.0 0.0 - 0.1 10e3/uL   NEUT% 68.0 38.4 - 76.8 %   LYMPH% 21.8 14.0 - 49.7 %   MONO% 6.6 0.0 - 14.0 %   EOS% 3.2 0.0 - 7.0 %   BASO% 0.4 0.0 - 2.0 %    DIAGNOSTIC IMAGING: 1. Bilateral screening mammogram performed 05/13/2015 reveals breast density category C.  Benign vascular calcifications in bilateral breasts with post operative changes in the right breast.  No significant masses or other findings bilaterally.  Repeat imaging in one year recommended.   2. Bone densitometry testing performed 05/13/2015 reveals T score of -2.2 in left femur neck.     ASSESSMENT AND PLAN:   1. History of breast cancer: Stage IA invasive ductal carcinoma of the right breast (11/2007), ER negative, PR negative, HER2/neu negative, Ki67 50%, S/P neoadjuvant chemotherapy on NSABP40 with docetaxel / gemcitabine / bevacizumab x 4 cycles followed by doxorubicin / cyclophosphamide x 4 with bevacizumab x 2 (completed 03/2008) with complete pathologic response at time of right lumpectomy/SLNB (05/2008) followed by adjuvant radiation therapy to the right breast (completed 08/2008), followed in a program of surveillance since that time.  Brittany [name] is doing well with no clinical symptoms worrisome for cancer recurrence at this time. I have reviewed the recommendations for ongoing surveillance with her and she will follow-up with her medical oncologist,  Dr. Jana Hakim, in one year's time per her request with history and physical exam per surveillance protocol.  Her mammogram will be due in March 2018.  She was instructed to make [place] aware if she notes any change within her breast, any new symptoms such as pain, shortness of breath, weight loss,  or fatigue.    2. Osteoporosis: Brittany [name] recent DEXA scan shows T score -2.2 showing improvement in her bone density as a result of  the denosumab.  We will therefore continue it at this time and have her return in six months (December 2017) for her next injection, as established per Dr. [name] plan.  Her creatinine is within normal limits today.  She will continue her calcium and vitamin D, as well as her weight bearing exercise.  Her next DEXA will be due 04/2017.  3. Anemia: I believe that Brittany [name]'s anemia is likely related to her recent blood donation (yesterday). She is asymptomatic and denies any recent bleeding / dark stools. I have asker her to return in one month's time to have her CBC rechecked.  Further disposition pending those results.    4. Hypotension: Upon review of Brittany [name]'s blood pressure readings from previous visits and her report, her blood pressure typically runs on the low side.  As she is asymptomatic from this today, we will monitor it.  I have asked that she check it a few times at a local drugstore or the fire department and let me know what it has been running.  She agreed to do so within the next week.     5. Cancer screening:  Due to Brittany [name] history and her age, she should receive screening for skin cancers, colon cancer, and gynecologic cancers.  The information and recommendations were shared with the patient and in her written after visit summary.  6. Support services/counseling: Brittany [name] was offered support today through active listening and expressive supportive counseling.  She is now almost 8 years out from her cancer diagnosis and doing well.  She was congratulated on remaining cancer free for another year!  A total of 35 minutes of face-to-face time was spent with this patient with greater than 50% of that time in counseling and care-coordination.   Sylvan Cheese, NP  Survivorship Program [facility] [phone]   Note: PRIMARY CARE PROVIDER Walker Kehr, [place] [phone]

## 2015-08-14 NOTE — Patient Instructions (Signed)
Denosumab injection  What is this medicine?  DENOSUMAB (den oh sue mab) slows bone breakdown. Prolia is used to treat osteoporosis in women after menopause and in men. Xgeva is used to prevent bone fractures and other bone problems caused by cancer bone metastases. Xgeva is also used to treat giant cell tumor of the bone.  This medicine may be used for other purposes; ask your health care provider or pharmacist if you have questions.  What should I tell my health care provider before I take this medicine?  They need to know if you have any of these conditions:  -dental disease  -eczema  -infection or history of infections  -kidney disease or on dialysis  -low blood calcium or vitamin D  -malabsorption syndrome  -scheduled to have surgery or tooth extraction  -taking medicine that contains denosumab  -thyroid or parathyroid disease  -an unusual reaction to denosumab, other medicines, foods, dyes, or preservatives  -pregnant or trying to get pregnant  -breast-feeding  How should I use this medicine?  This medicine is for injection under the skin. It is given by a health care professional in a hospital or clinic setting.  If you are getting Prolia, a special MedGuide will be given to you by the pharmacist with each prescription and refill. Be sure to read this information carefully each time.  For Prolia, talk to your pediatrician regarding the use of this medicine in children. Special care may be needed. For Xgeva, talk to your pediatrician regarding the use of this medicine in children. While this drug may be prescribed for children as young as 13 years for selected conditions, precautions do apply.  Overdosage: If you think you have taken too much of this medicine contact a poison control center or emergency room at once.  NOTE: This medicine is only for you. Do not share this medicine with others.  What if I miss a dose?  It is important not to miss your dose. Call your doctor or health care professional if you are  unable to keep an appointment.  What may interact with this medicine?  Do not take this medicine with any of the following medications:  -other medicines containing denosumab  This medicine may also interact with the following medications:  -medicines that suppress the immune system  -medicines that treat cancer  -steroid medicines like prednisone or cortisone  This list may not describe all possible interactions. Give your health care provider a list of all the medicines, herbs, non-prescription drugs, or dietary supplements you use. Also tell them if you smoke, drink alcohol, or use illegal drugs. Some items may interact with your medicine.  What should I watch for while using this medicine?  Visit your doctor or health care professional for regular checks on your progress. Your doctor or health care professional may order blood tests and other tests to see how you are doing.  Call your doctor or health care professional if you get a cold or other infection while receiving this medicine. Do not treat yourself. This medicine may decrease your body's ability to fight infection.  You should make sure you get enough calcium and vitamin D while you are taking this medicine, unless your doctor tells you not to. Discuss the foods you eat and the vitamins you take with your health care professional.  See your dentist regularly. Brush and floss your teeth as directed. Before you have any dental work done, tell your dentist you are receiving this medicine.  Do   not become pregnant while taking this medicine or for 5 months after stopping it. Women should inform their doctor if they wish to become pregnant or think they might be pregnant. There is a potential for serious side effects to an unborn child. Talk to your health care professional or pharmacist for more information.  What side effects may I notice from receiving this medicine?  Side effects that you should report to your doctor or health care professional as soon as  possible:  -allergic reactions like skin rash, itching or hives, swelling of the face, lips, or tongue  -breathing problems  -chest pain  -fast, irregular heartbeat  -feeling faint or lightheaded, falls  -fever, chills, or any other sign of infection  -muscle spasms, tightening, or twitches  -numbness or tingling  -skin blisters or bumps, or is dry, peels, or red  -slow healing or unexplained pain in the mouth or jaw  -unusual bleeding or bruising  Side effects that usually do not require medical attention (Report these to your doctor or health care professional if they continue or are bothersome.):  -muscle pain  -stomach upset, gas  This list may not describe all possible side effects. Call your doctor for medical advice about side effects. You may report side effects to FDA at 1-800-FDA-1088.  Where should I keep my medicine?  This medicine is only given in a clinic, doctor's office, or other health care setting and will not be stored at home.  NOTE: This sheet is a summary. It may not cover all possible information. If you have questions about this medicine, talk to your doctor, pharmacist, or health care provider.      2016, Elsevier/[employer] Standard. (2011-08-17 12:37:47)

## 2015-08-14 NOTE — Patient Instructions (Signed)
Thank you for coming in today!  As we discussed, please check your blood pressure at the local drug store and let me know if ir is low.  We will have you come back in one month's time to have your hemoglobin rechecked and make sure that it has recovered following your blood donation.  I will call you with those results.  Please continue to perform your self breast exam and report any changes. If you note any new symptoms (please see below), be sure to notify us ASAP.  Your mammogram will be due in March 2018 and your next prolia injection will be in December 2017. We'll have you return in one year's time (June 2018) to see Dr. Jana Hakim and for your next prolia injection. Please let us know if you have any changes prior to that time.  Please be sure to stop by scheduling on your way out to make those appointment(s).  Looking forward to working with you in the future!  Let us know if you have any questions!  Symptoms to Watch for and Report to Your Provider  . Return of the cancer symptoms you had before- such as a lump or new growth where your cancer first started . New or unusual pain that seems unrelated to an injury and does not go away, including back pain or bone pain . Weight loss without trying/intending . Unexplained bleeding . A rash or allergic reaction, such as swelling, severe itching or wheezing . Chills or fevers . Persistent headaches . Shortness of breath or difficulty breathing . Bloody stools or blood in your urine . Lumps, bumps, swelling and/or nipple discharge . Nausea, vomiting, diarrhea, loss of appetite, or trouble swallowing . A cough that doesn't go away . Abdominal pain . Swelling in your arms or legs . Fractures . Hot flashes or other menopausal symptoms . Any other signs mentioned by your doctor or nurse or any unusual symptoms                 that you just can't explain   NOTE: Just because you have certain symptoms, it doesn't mean the cancer has come back or you  have a new cancer. Symptoms can be due to other problems that need to be addressed.  It is important to watch for these symptoms and report them to your provider so you can be medically evaluated for any of these concerns!     Living a Life of Wellness After Cancer:  *Note: Please consult your health care provider before using any medications, supplements, over-the-counter products, or other interventions.  Also, please consult your primary care provider before you begin any lifestyle program (diet, exercise, etc.).  Your safety is our top priority and we want to make sure you continue to live a long and healthy life!    Healthy Lifestyle Recommendations  As a cancer survivor, it is important develop a lifelong commitment to a healthy lifestyle. A healthy lifestyle can prevent cancer from returning as well as prevent other diseases like heart disease, diabetes and high blood pressure.  These are some things that you can do to have a healthy lifestyle:  [name] Maintain a healthy weight.  . Exercise daily per your doctor's orders. . Eat a balanced diet high in fruits, vegetables, bran, and fiber. Limit intake of red meat      and processed foods.  . Limit how much alcohol you consume, if at all. Ali Lowe regular bone mineral density testing for osteoporosis.  [name]  Talk to your doctor about cardiovascular disease or "heart disease" screening. . Stop smoking (if you smoke). . Know your family history. . Be mindful of your emotional, social, and spiritual needs. . Meet regularly with a Primary Care Provider (PCP). Find a PCP if you do not             already have one. . Talk to your doctor about regular cancer screening including screening for colon           cancer, GYN cancers, and skin cancer.

## 2015-08-14 NOTE — Telephone Encounter (Signed)
Gave and rpinted appt sched and avs for pt for July, DEC and June 2018

## 2015-08-27 ENCOUNTER — Encounter: Payer: Self-pay | Admitting: Internal Medicine

## 2015-08-27 ENCOUNTER — Ambulatory Visit: Payer: PPO | Admitting: Internal Medicine

## 2015-08-27 VITALS — BP 120/70 | HR 81 | Wt 126.0 lb

## 2015-08-27 DIAGNOSIS — M25561 Pain in right knee: Secondary | ICD-10-CM | POA: Diagnosis not present

## 2015-08-27 MED ORDER — METHYLPREDNISOLONE ACETATE 40 MG/ML IJ SUSP
40.0000 mg | Freq: Once | INTRAMUSCULAR | Status: AC
  Administered 2015-08-27: 40 mg via INTRA_ARTICULAR

## 2015-08-27 MED ORDER — MELOXICAM 15 MG PO TABS: 15.0000 mg | ORAL_TABLET | Freq: Every day | ORAL | Status: DC | PRN

## 2015-08-27 NOTE — Assessment & Plan Note (Signed)
Will inject Meloxicam ACE

## 2015-08-27 NOTE — Patient Instructions (Signed)
Postprocedure instructions :    A Band-Aid should be left on for 12 hours. Injection therapy is not a cure itself. It is used in conjunction with other modalities. You can use nonsteroidal anti-inflammatories like ibuprofen , hot and cold compresses. Rest is recommended in the next 24 hours. You need to report immediately  if fever, chills or any signs of infection develop. 

## 2015-08-27 NOTE — Progress Notes (Signed)
Pre visit review using our clinic review tool, if applicable. No additional management support is needed unless otherwise documented below in the visit note. 

## 2015-08-27 NOTE — Progress Notes (Signed)
Subjective:  Patient ID: Brittany [name], female    DOB: 1945-03-13  Age: 70 y.o. MRN: [id number]  CC: No chief complaint on file.   HPI Patches Montecino Lesch presents for R knee pain x 4 mo. On Meloxicam, new shoes - a little better  Outpatient Prescriptions Prior to Visit  Medication Sig Dispense Refill  . ASPIRIN PO Take 81 mg by mouth daily.     [name] BIOTIN PO Take by mouth daily.    [name] CALCIUM CITRATE-VITAMIN D PO     . Cholecalciferol (VITAMIN D-3 PO) Take 1 tablet by mouth daily.     . citalopram (CELEXA) 40 MG tablet TAKE 1 TABLET (40 MG TOTAL) BY MOUTH DAILY. 90 tablet 1  . Cyanocobalamin (VITAMIN B-12 CR PO) Take by mouth.    . denosumab (PROLIA) 60 MG/ML SOLN injection Inject 60 mg into the skin once. Administer in upper arm, thigh, or abdomen 1.8 mL 1  . diphenhydrAMINE (BENADRYL) 25 MG tablet Take 25 mg by mouth every 6 (six) hours as needed.    . meloxicam (MOBIC) 15 MG tablet Take 1 tablet (15 mg total) by mouth daily as needed for pain. 30 tablet 3  . Omega-3 Fatty Acids (FISH OIL) 1000 MG CAPS Take 1 capsule by mouth daily.      . pravastatin (PRAVACHOL) 20 MG tablet Take 1 tablet (20 mg total) by mouth daily. 90 tablet 3  . ranitidine (ZANTAC) 150 MG tablet Take 1 tablet (150 mg total) by mouth 2 (two) times daily. 180 tablet 3  . SUMAtriptan (IMITREX) 100 MG tablet Take 1 tablet (100 mg total) by mouth once. May repeat in 2 hours if headache persists or recurs. 9 tablet 3  . zolpidem (AMBIEN) 10 MG tablet TAKE 1 TAB BY MOUTH NIGHTLY AT BEDTIME AS NEEDED FOR SLEEP. 90 tablet 1  . ciclopirox (PENLAC) 8 % solution Apply topically at bedtime. Apply over nail and surrounding skin. Apply daily over previous coat. After seven (7) days, may remove with alcohol and continue cycle. (Patient not taking: Reported on 08/27/2015) 6.6 mL 0   No facility-administered medications prior to visit.    ROS Review of Systems  Constitutional: Negative for chills, activity change, appetite change,  fatigue and unexpected weight change.  HENT: Negative for congestion, mouth sores and sinus pressure.   Eyes: Negative for visual disturbance.  Respiratory: Negative for cough and chest tightness.   Gastrointestinal: Negative for nausea and abdominal pain.  Genitourinary: Negative for frequency, difficulty urinating and vaginal pain.  Musculoskeletal: Positive for arthralgias. Negative for back pain and gait problem.  Skin: Negative for pallor and rash.  Neurological: Negative for dizziness, tremors, weakness, numbness and headaches.  Psychiatric/Behavioral: Negative for confusion and sleep disturbance.    Objective:  BP 120/70 mmHg  Pulse 81  Wt 126 lb (57.153 kg)  SpO2 97%  LMP 03/02/1994 (Approximate)  BP Readings from Last 3 Encounters:  08/27/15 120/70  08/14/15 100/38  07/31/15 110/70    Wt Readings from Last 3 Encounters:  08/27/15 126 lb (57.153 kg)  08/14/15 127 lb 1.6 oz (57.652 kg)  07/31/15 127 lb (57.607 kg)    Physical Exam  Constitutional: She appears well-developed. No distress.  HENT:  Head: Normocephalic.  Right Ear: External ear normal.  Left Ear: External ear normal.  Nose: Nose normal.  Mouth/Throat: Oropharynx is clear and moist.  Eyes: Conjunctivae are normal. Pupils are equal, round, and reactive to light. Right eye exhibits no discharge. Left eye  exhibits no discharge.  Neck: Normal range of motion. Neck supple. No JVD present. No tracheal deviation present. No thyromegaly present.  Cardiovascular: Normal rate, regular rhythm and normal heart sounds.   Pulmonary/Chest: No stridor. No respiratory distress. She has no wheezes.  Abdominal: Soft. Bowel sounds are normal. She exhibits no distension and no mass. There is no tenderness. There is no rebound and no guarding.  Musculoskeletal: She exhibits no edema or tenderness.  Lymphadenopathy:    She has no cervical adenopathy.  Neurological: She displays normal reflexes. No cranial nerve deficit. She  exhibits normal muscle tone. Coordination normal.  Skin: No rash noted. No erythema.  Psychiatric: She has a normal mood and affect. Her behavior is normal. Judgment and thought content normal.  R knee is tender   Procedure Note :     Procedure : Joint Injection,  R knee   Indication:  Joint osteoarthritis with refractory  chronic pain.   Risks including unsuccessful procedure , bleeding, infection, bruising, skin atrophy, "steroid flare-up" and others were explained to the patient in detail as well as the benefits. Informed consent was obtained and signed.   Tthe patient was placed in a comfortable position. Lateral approach was used. Skin was prepped with Betadine and alcohol  and anesthetized a cooling spray. Then, a 5 cc syringe with a 1.5 inch long 25-gauge needle was used for a joint injection.. The needle was advanced  Into the knee joint cavity. I aspirated a small amount of intra-articular fluid to confirm correct placement of the needle and injected the joint with 5 mL of 2% lidocaine and 40 mg of Depo-Medrol .  Band-Aid was applied.   Tolerated well. Complications: None. Good pain relief following the procedure.    Lab Results  Component Value Date   WBC 7.2 08/14/2015   HGB 10.6* 08/14/2015   HCT 32.2* 08/14/2015   PLT 226 08/14/2015   GLUCOSE 106 08/14/2015   CHOL 163 07/31/2015   TRIG 70.0 07/31/2015   HDL 63.30 07/31/2015   LDLDIRECT 178.9 03/23/2013   LDLCALC 85 07/31/2015   ALT 12 08/14/2015   AST 17 08/14/2015   NA 138 08/14/2015   K 4.0 08/14/2015   CL 100 07/31/2015   CREATININE 0.8 08/14/2015   BUN 13.2 08/14/2015   CO2 28 08/14/2015   TSH 1.41 03/23/2013    Dg Knee 1-2 Views Right  07/31/2015  CLINICAL DATA:  Generalized knee pain for 3 months, no injury EXAM: RIGHT KNEE - 1-2 VIEW COMPARISON:  None. FINDINGS: There is mild tricompartmental degenerative joint disease the right knee for age with some loss of joint space laterally and at the patella  femoral articulation, and to a lesser degree medially. No fracture is seen. There may be a small amount of joint fluid present. 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 Cannot exclude a small amount of joint fluid. Electronically Signed   By: Ivar Drape M.D.   On: 07/31/2015 15:15    Assessment & Plan:   There are no diagnoses linked to this encounter. I am having Ms. Nuno maintain her Cholecalciferol (VITAMIN D-3 PO), ASPIRIN PO, Fish Oil, Cyanocobalamin (VITAMIN B-12 CR PO), BIOTIN PO, diphenhydrAMINE, CALCIUM CITRATE-VITAMIN D PO, ciclopirox, denosumab, meloxicam, SUMAtriptan, pravastatin, citalopram, ranitidine, and zolpidem.  No orders of the defined types were placed in this encounter.     Follow-up: No Follow-up on file.  Walker Kehr, MD

## 2015-08-31 ENCOUNTER — Encounter: Payer: Self-pay | Admitting: Internal Medicine

## 2015-09-09 ENCOUNTER — Telehealth: Payer: Self-pay | Admitting: Nurse Practitioner

## 2015-09-09 NOTE — Telephone Encounter (Signed)
Received phone message from patient requesting return call.  Returned call to patient regarding upcoming appointment, advising patient that purpose of appointment was lab visit to recheck hemoglobin to ensure that anemia seen at last visit was secondary to blood donation rather than another underlying etiology.  Pt voiced understanding and will come for study later this month.  Pt without other questions at this time.

## 2015-09-16 ENCOUNTER — Other Ambulatory Visit (HOSPITAL_BASED_OUTPATIENT_CLINIC_OR_DEPARTMENT_OTHER): Payer: PPO

## 2015-09-16 DIAGNOSIS — D649 Anemia, unspecified: Secondary | ICD-10-CM | POA: Diagnosis not present

## 2015-09-16 DIAGNOSIS — C50911 Malignant neoplasm of unspecified site of right female breast: Secondary | ICD-10-CM | POA: Diagnosis not present

## 2015-09-16 LAB — CBC WITH DIFFERENTIAL/PLATELET
BASO%: 0.7 % (ref 0.0–2.0)
Basophils Absolute: 0 10*3/uL (ref 0.0–0.1)
EOS ABS: 0.3 10*3/uL (ref 0.0–0.5)
EOS%: 4.7 % (ref 0.0–7.0)
HCT: 35.5 % (ref 34.8–46.6)
HEMOGLOBIN: 11.4 g/dL — AB (ref 11.6–15.9)
LYMPH%: 25.7 % (ref 14.0–49.7)
MCH: 28.3 pg (ref 25.1–34.0)
MCHC: 32 g/dL (ref 31.5–36.0)
MCV: 88.5 fL (ref 79.5–101.0)
MONO#: 0.4 10*3/uL (ref 0.1–0.9)
MONO%: 7.8 % (ref 0.0–14.0)
NEUT%: 61.1 % (ref 38.4–76.8)
NEUTROS ABS: 3.5 10*3/uL (ref 1.5–6.5)
Platelets: 271 10*3/uL (ref 145–400)
RBC: 4.01 10*6/uL (ref 3.70–5.45)
RDW: 14 % (ref 11.2–14.5)
WBC: 5.7 10*3/uL (ref 3.9–10.3)
lymph#: 1.5 10*3/uL (ref 0.9–3.3)

## 2015-09-19 ENCOUNTER — Telehealth: Payer: Self-pay | Admitting: Adult Health

## 2015-09-19 NOTE — Telephone Encounter (Signed)
I attempted to reach Ms. Weldon to talk to her about her recent lab work done to recheck her CBC.  Per notes from Chestine Spore, NP (previous provider who saw the patient), she was mildly anemic, but the patient reported that she had given blood the day before her labs were drawn at that time.  Her labs were rechecked and I was calling to talk to the patient about the results.   Unable to speak with patient. I left a voicemail asking her to return my call when she was able.  Awaiting return call.   Mike Craze, NP [place] [phone]

## 2015-09-23 ENCOUNTER — Other Ambulatory Visit: Payer: Self-pay | Admitting: Internal Medicine

## 2015-09-23 ENCOUNTER — Encounter: Payer: Self-pay | Admitting: Internal Medicine

## 2015-09-23 DIAGNOSIS — M25569 Pain in unspecified knee: Principal | ICD-10-CM

## 2015-09-23 DIAGNOSIS — G8929 Other chronic pain: Secondary | ICD-10-CM

## 2015-09-26 NOTE — Progress Notes (Signed)
[name] [address] [place], [place] [address] Phone: [phone] Subjective:    I'm seeing this patient by the request  of:    CC: right knee pain  [id number]  Brittany [name] is a 70 y.o. female coming in with complaint of Right knee pain been 5 months, Found to have some arthritis, saw PCP and given injection 1 month ago. States it help the pain but now have a mass that is painful in the back of the knee. Patient is concerned that she does have past medical history for breast cancer. Denies any fevers, chills, any abnormal weight loss.  Patient states that she does have some instability of the knee as well.  Previous imaging shows mild OA of knee x 3 compartments.     Past Medical History:  Diagnosis Date  . Acid reflux   . Breast cancer ([place]) 11/23/2007   right, ER/PR -, HER 2 -  . Cancer [facility])    breast right  triple recet neg, lumpect, chemo, XRT  . Esophageal stricture   . Gastritis 10/2007  . History of chemotherapy    neoadjuvant chemo, last dose 03/2008  . History of radiation therapy 07/25/08 -09/11/08   right breast  . Hyperlipidemia   . Migraines    rare  . Osteopenia    Past Surgical History:  Procedure Laterality Date  . BREAST LUMPECTOMY  06/26/2008   right, CA   XRT, chemo   Social History   Social History  . Marital status: Married    Spouse name: N/A  . Number of children: 3  . Years of education: N/A   Occupational History  .  Retired   Social History Main Topics  . Smoking status: Former Smoker    Years: 5.00    Types: Cigarettes    Quit date: 03/03/1967  . Smokeless tobacco: Never Used     Comment: 6 cig per day when she was smoking/quit years ago  . Alcohol use No  . Drug use: No  . Sexual activity: Yes    Partners: Male    Birth control/ protection: Post-menopausal   Other Topics Concern  . None   Social History Narrative   G3, P3, 1st pregnancy age 7, menopause age 63, no HRT   Allergies    Allergen Reactions  . Ibandronate Sodium     REACTION: intolerance--BONIVA  . Ibandronic Acid Nausea Only  . Lipitor [Atorvastatin]     arthralgias   Family History  Problem Relation Age of Onset  . Heart disease Father   . Cancer Father     lung  . Cancer Sister 20    Breast & Colorectal  . Epilepsy Mother   . Dementia Mother     Past medical history, social, surgical and family history all reviewed in electronic medical record.  No pertanent information unless stated regarding to the chief complaint.   Review of Systems: No headache, visual changes, nausea, vomiting, diarrhea, constipation, dizziness, abdominal pain, skin rash, fevers, chills, night sweats, weight loss, swollen lymph nodes, body aches, joint swelling, muscle aches, chest pain, shortness of breath, mood changes.   Objective  Blood pressure 122/80, pulse 66, height 5\' 7"  (1.702 m), weight 127 lb (57.6 kg), last menstrual period 03/02/1994, SpO2 98 %.  General: No apparent distress alert and oriented x3 mood and affect normal, dressed appropriately.  HEENT: Pupils equal, extraocular movements intact  Respiratory: Patient's speak in full sentences and does not appear  short of breath  Cardiovascular: No lower extremity edema, non tender, no erythema  Skin: Warm dry intact with no signs of infection or rash on extremities or on axial skeleton.  Abdomen: Soft nontender  Neuro: Cranial nerves II through XII are intact, neurovascularly intact in all extremities with 2+ DTRs and 2+ pulses.  Lymph: No lymphadenopathy of posterior or anterior cervical chain or axillae bilaterally.  Gait normal with good balance and coordination.  MSK:  Non tender with full range of motion and good stability and symmetric strength and tone of shoulders, elbows, wrist, hip, and ankles bilaterally.  Knee:right Significant valgus deformity of the right knee compared to the contralateral side Patient does have a mass that is in the posterior  medial aspect of the popliteal area. Minorly tender to palpation Next last 10 of flexion Instability with valgus force minorly positiveMcmurray's, Apley's, and Thessalonian tests. Non painful patellar compression. Patellar glide without crepitus. Patellar and quadriceps tendons unremarkable. Hamstring and quadriceps strength is normal.  Contralateral knee has osteoarthritic changes as well but nontender    MSK US performed of: right knee This study was ordered, performed, and interpreted by Charlann Boxer D.O.  Knee: In the popliteal area and patient does have what appears to be a septated Baker cyst. Mild increase in Doppler flow surrounding the area but no true mass. No increased vascularity.  IMPRESSION:  septated Baker's cyst  Procedure: Real-time Ultrasound Guided Injection of right Baker cyst aspiration Device: GE Logiq E  Ultrasound guided injection is preferred based studies that show increased duration, increased effect, greater accuracy, decreased procedural pain, increased response rate, and decreased cost with ultrasound guided versus blind injection.  Verbal informed consent obtained.  Time-out conducted.  Noted no overlying erythema, induration, or other signs of local infection.  Skin prepped in a sterile fashion.  Local anesthesia: Topical Ethyl chloride.  With sterile technique and under real time ultrasound guidance:  With a 21-gauge 2 inch needle patient was injected with 2 mL of 0.5% Marcaine. Patient did have aspiration of 12 mL of strawlike fluid. Patient tolerated the procedure well. Then injected 1 mL of Kenalog 40 mg/dL. Completed without difficulty  Pain immediately resolved suggesting accurate placement of the medication.  Advised to call if fevers/chills, erythema, induration, drainage, or persistent bleeding.  Images permanently stored and available for review in the ultrasound unit.  Impression: Technically successful ultrasound guided  injection.  Impression and Recommendations:     This case required medical decision making of moderate complexity.      Note: This dictation was prepared with Dragon dictation along with smaller phrase technology. Any transcriptional errors that result from this process are unintentional.

## 2015-09-27 ENCOUNTER — Ambulatory Visit (INDEPENDENT_AMBULATORY_CARE_PROVIDER_SITE_OTHER): Payer: PPO | Admitting: Family Medicine

## 2015-09-27 ENCOUNTER — Other Ambulatory Visit: Payer: PPO

## 2015-09-27 ENCOUNTER — Encounter: Payer: Self-pay | Admitting: Family Medicine

## 2015-09-27 ENCOUNTER — Other Ambulatory Visit: Payer: Self-pay

## 2015-09-27 VITALS — BP 122/80 | HR 66 | Ht 67.0 in | Wt 127.0 lb

## 2015-09-27 DIAGNOSIS — M25561 Pain in right knee: Secondary | ICD-10-CM

## 2015-09-27 DIAGNOSIS — M7121 Synovial cyst of popliteal space [Baker], right knee: Secondary | ICD-10-CM

## 2015-09-27 DIAGNOSIS — M1711 Unilateral primary osteoarthritis, right knee: Secondary | ICD-10-CM | POA: Insufficient documentation

## 2015-09-27 DIAGNOSIS — M7120 Synovial cyst of popliteal space [Baker], unspecified knee: Secondary | ICD-10-CM | POA: Insufficient documentation

## 2015-09-27 DIAGNOSIS — G5621 Lesion of ulnar nerve, right upper limb: Secondary | ICD-10-CM

## 2015-09-27 HISTORY — DX: Unilateral primary osteoarthritis, right knee: M17.11

## 2015-09-27 NOTE — Assessment & Plan Note (Signed)
Patient is having worsening pain. I do think the patient has instability. Would do that with a custom brace. Patient declined. Encourage patient to do the home exercises on a regular basis and will try an over-the-counter brace. Patient did have a steroid injection in the knee previously. She could be a candidate for viscous supplementation as well. We'll discuss again in 2 weeks.

## 2015-09-27 NOTE — Patient Instructions (Addendum)
Good to see you.  Ice 20 minutes 2 times daily. Usually after activity and before bed. We drained the cyst today and will send the fluid Exercises 3 times a week.  Compression sleeve can keep it from being worse.  pennsaid pinkie amount topically 2 times daily as needed.  With the history of breast cancer if you do not feel much better on Monday or next week or you feel it come back we will get MRI.

## 2015-09-27 NOTE — Assessment & Plan Note (Signed)
Discussed with patient at great length. We did discuss with her that this can recur. Gave her different treatment options as well as discussing the possibility of an MRI if she is truly concern for any type of mass but not appreciated today on exam or on ultrasound. Patient elected to have the aspiration and tolerated the procedure well. Patient had improvement in range of motion but unfortunately increasing in instability of the knee. Patient is going to try an over-the-counter brace at this moment. Given trial of topical anti-inflammatories. We discussed icing regimen. Patient will come back and see me again in 2 weeks. Worsening symptoms are still patient is highly anxious we will consider an MRI with and without contrast of the knee.

## 2015-09-28 ENCOUNTER — Encounter: Payer: Self-pay | Admitting: Family Medicine

## 2015-09-28 LAB — SYNOVIAL CELL COUNT + DIFF, W/ CRYSTALS
BASOPHILS, %: 0 %
Eosinophils-Synovial: 0 % (ref 0–2)
Lymphocytes-Synovial Fld: 78 % — ABNORMAL HIGH (ref 0–74)
Monocyte/Macrophage: 6 % (ref 0–69)
NEUTROPHIL, SYNOVIAL: 10 % (ref 0–24)
SYNOVIOCYTES, %: 6 % (ref 0–15)
WBC, Synovial: 200 cells/uL — ABNORMAL HIGH (ref <150)

## 2015-09-29 ENCOUNTER — Encounter: Payer: Self-pay | Admitting: Family Medicine

## 2015-09-30 ENCOUNTER — Telehealth: Payer: Self-pay | Admitting: *Deleted

## 2015-09-30 NOTE — Telephone Encounter (Signed)
Follow up call to inform pt that lab results(Hgb and hematocrit) are better compared to previous. Pt apologized for not returning call but was out of town. Pt denies tiredness, states, "I feel fine". Told pt Alisa Graff just wanted to make sure she was okay and to inform that  results were better now. Pt verbalized understanding and thanked [place] for the call. No further concerns.

## 2015-10-01 LAB — BODY FLUID CULTURE
GRAM STAIN: NONE SEEN
GRAM STAIN: NONE SEEN
ORGANISM ID, BACTERIA: NO GROWTH

## 2015-10-01 NOTE — Progress Notes (Signed)
[name] [address] [place], [place] [address] Phone: [phone] Subjective:     CC: right knee pain f/u  [id number]  Brittany [name] is a 70 y.o. female coming in with complaint of Right knee pain been 5 months, Found to have some arthritis, saw PCP and given injection 1 month ago. States it help the pain but now have a mass that is painful in the back of the knee.    Saw me last week and had aspiration, better for awhile but the mass is getting bigger. Patient states that hurts just as bad as it did prior to the aspiration. Patient feels that it may be bigger and that it was previously. Once again denies any fevers, chills, any abnormal weight loss.   Patient states that she does have some instability of the knee as well.  Previous imaging shows mild OA of knee x 3 compartments.    Patient's fluid was sent for further evaluation and the labs that showed a minor elevation in white blood cell count of 200 was a minor elevation and lymphocytes of 78%. Other than that completely regular with negative culture.  Past Medical History:  Diagnosis Date  . Acid reflux   . Breast cancer ([place]) 11/23/2007   right, ER/PR -, HER 2 -  . Cancer [facility])    breast right  triple recet neg, lumpect, chemo, XRT  . Esophageal stricture   . Gastritis 10/2007  . History of chemotherapy    neoadjuvant chemo, last dose 03/2008  . History of radiation therapy 07/25/08 -09/11/08   right breast  . Hyperlipidemia   . Migraines    rare  . Osteopenia    Past Surgical History:  Procedure Laterality Date  . BREAST LUMPECTOMY  06/26/2008   right, CA   XRT, chemo   Social History   Social History  . Marital status: Married    Spouse name: N/A  . Number of children: 3  . Years of education: N/A   Occupational History  .  Retired   Social History Main Topics  . Smoking status: Former Smoker    Years: 5.00    Types: Cigarettes    Quit date: 03/03/1967  . Smokeless  tobacco: Never Used     Comment: 6 cig per day when she was smoking/quit years ago  . Alcohol use No  . Drug use: No  . Sexual activity: Yes    Partners: Male    Birth control/ protection: Post-menopausal   Other Topics Concern  . Not on file   Social History Narrative   G3, P3, 1st pregnancy age 63, menopause age 14, no HRT   Allergies  Allergen Reactions  . Ibandronate Sodium     REACTION: intolerance--BONIVA  . Ibandronic Acid Nausea Only  . Lipitor [Atorvastatin]     arthralgias   Family History  Problem Relation Age of Onset  . Heart disease Father   . Cancer Father     lung  . Cancer Sister 57    Breast & Colorectal  . Epilepsy Mother   . Dementia Mother     Past medical history, social, surgical and family history all reviewed in electronic medical record.  No pertanent information unless stated regarding to the chief complaint.   Review of Systems: No headache, visual changes, nausea, vomiting, diarrhea, constipation, dizziness, abdominal pain, skin rash, fevers, chills, night sweats, weight loss, swollen lymph nodes, body aches, joint swelling, muscle aches, chest pain,  shortness of breath, mood changes.   Objective  Last menstrual period 03/02/1994.  General: No apparent distress alert and oriented x3 mood and affect normal, dressed appropriately.  HEENT: Pupils equal, extraocular movements intact  Respiratory: Patient's speak in full sentences and does not appear short of breath  Cardiovascular: No lower extremity edema, non tender, no erythema  Skin: Warm dry intact with no signs of infection or rash on extremities or on axial skeleton.  Abdomen: Soft nontender  Neuro: Cranial nerves II through XII are intact, neurovascularly intact in all extremities with 2+ DTRs and 2+ pulses.  Lymph: No lymphadenopathy of posterior or anterior cervical chain or axillae bilaterally.  Gait normal with good balance and coordination.  MSK:  Non tender with full range of  motion and good stability and symmetric strength and tone of shoulders, elbows, wrist, hip, and ankles bilaterally.  Knee:right Significant valgus deformity of the right knee compared to the contralateral side Patient does have a mass that is in the posterior medial aspect of the popliteal area. Minorly tender to palpation Lacks last 10-15 of flexion Instability with valgus force minorly positiveMcmurray's, Apley's, and Thessalonian tests. Non painful patellar compression. Patellar glide without crepitus. Patellar and quadriceps tendons unremarkable. Hamstring and quadriceps strength is normal.  Contralateral knee has osteoarthritic changes as well but nontender    MSK US performed of: right knee This study was ordered, performed, and interpreted by Charlann Boxer D.O.  Knee: In the popliteal area and patient does have what appears to be a septated Baker cyst. Seems as large if not larger than previous exam. 4 x 4 centimeters in length it appears. No significant change from aspiration.  IMPRESSION:  septated Baker's cyst    Impression and Recommendations:     This case required medical decision making of moderate complexity.      Note: This dictation was prepared with Dragon dictation along with smaller phrase technology. Any transcriptional errors that result from this process are unintentional.

## 2015-10-02 ENCOUNTER — Other Ambulatory Visit: Payer: Self-pay

## 2015-10-02 ENCOUNTER — Ambulatory Visit (INDEPENDENT_AMBULATORY_CARE_PROVIDER_SITE_OTHER): Payer: PPO | Admitting: Family Medicine

## 2015-10-02 ENCOUNTER — Encounter: Payer: Self-pay | Admitting: Family Medicine

## 2015-10-02 VITALS — BP 100/68 | HR 83

## 2015-10-02 DIAGNOSIS — M25561 Pain in right knee: Secondary | ICD-10-CM | POA: Diagnosis not present

## 2015-10-02 NOTE — Assessment & Plan Note (Signed)
Due to patient's coming back within one-week from the aspiration and having this even larger than it was as well as the significant septation of the bursa sac I do feel that further imaging is warranted. Patient does have a past medical risks history significant for breast cancer and we should rule out mass. Patient was in agreement with this plan. We will make this MRI urgent. I would like patient come back 1-2 days afterwards and we'll discuss findings. At that point we will decide what the [facility] option. If it is a possible repeat Baker cyst and underwent saying try an aspiration one more time can be beneficial. We will discuss at follow-up.  Spent  25 minutes with patient face-to-face and had greater than 50% of counseling including as described above in assessment and plan.

## 2015-10-03 NOTE — Addendum Note (Signed)
Addended by: Douglass Rivers T on: 10/03/2015 10:39 AM   Modules accepted: Orders

## 2015-10-07 ENCOUNTER — Telehealth: Payer: Self-pay | Admitting: Adult Health

## 2015-10-07 ENCOUNTER — Encounter: Payer: Self-pay | Admitting: Adult Health

## 2015-10-07 NOTE — Telephone Encounter (Signed)
I attempted to reach [name] to follow-up on her recent MyChart message to me about leg pain/Baker's cyst in her knee.  I wanted to get more information from her to see if we needed to bring her in for further evaluation and additional testing, based on her symptoms.   I was unable to reach the patient, but left a voicemail asking her to return my call.  Awaiting return call.   Mike Craze, NP [place] [phone]

## 2015-10-08 ENCOUNTER — Ambulatory Visit
Admission: RE | Admit: 2015-10-08 | Discharge: 2015-10-08 | Disposition: A | Discharge status: Home / Self Care | Payer: PPO | Source: Ambulatory Visit | Attending: Family Medicine | Admitting: Family Medicine

## 2015-10-08 ENCOUNTER — Ambulatory Visit: Payer: PPO | Admitting: Family Medicine

## 2015-10-08 DIAGNOSIS — S83241A Other tear of medial meniscus, current injury, right knee, initial encounter: Secondary | ICD-10-CM | POA: Diagnosis not present

## 2015-10-08 DIAGNOSIS — M25561 Pain in right knee: Secondary | ICD-10-CM

## 2015-10-08 MED ORDER — GADOBENATE DIMEGLUMINE 529 MG/ML IV SOLN
11.0000 mL | Freq: Once | INTRAVENOUS | Status: AC | PRN
  Administered 2015-10-08: 11 mL via INTRAVENOUS

## 2015-10-09 ENCOUNTER — Encounter: Payer: Self-pay | Admitting: Family Medicine

## 2015-10-09 ENCOUNTER — Other Ambulatory Visit: Payer: Self-pay

## 2015-10-09 ENCOUNTER — Ambulatory Visit (INDEPENDENT_AMBULATORY_CARE_PROVIDER_SITE_OTHER): Payer: PPO | Admitting: Family Medicine

## 2015-10-09 VITALS — BP 124/80 | HR 61 | Wt 124.0 lb

## 2015-10-09 DIAGNOSIS — M25561 Pain in right knee: Secondary | ICD-10-CM

## 2015-10-09 DIAGNOSIS — M7121 Synovial cyst of popliteal space [Baker], right knee: Secondary | ICD-10-CM

## 2015-10-09 DIAGNOSIS — G5621 Lesion of ulnar nerve, right upper limb: Secondary | ICD-10-CM

## 2015-10-09 DIAGNOSIS — M1711 Unilateral primary osteoarthritis, right knee: Secondary | ICD-10-CM | POA: Diagnosis not present

## 2015-10-09 NOTE — Patient Instructions (Signed)
Good ot se eyou  3-4 weeks We will get PT to call you and the brace company will call you  Ice in 6 hours.

## 2015-10-09 NOTE — Assessment & Plan Note (Signed)
Patient had repeated aspiration again today. Brittany [name] this will be more beneficial this time. Discussed compression, home exercises, patient will consider possible formal physical therapy but we'll call back. Patient will be fitted for custom bracing and anterior to the amount of arthritis. Patient will consider viscous supple mentation

## 2015-10-09 NOTE — Progress Notes (Signed)
[name] [place], [place] [address] Phone: [phone] Subjective:     CC: right knee pain f/u  [id number]  Brittany [name] is a 70 y.o. female coming in with complaint of Right knee pain. Patient was found to have a Baker cyst. Attempted aspiration but unfortunately reaccumulated immediately. Patient's past medical history for breast cancer MRI was ordered.  MRI was independently visualized by me. MRI showed advanced tricompartmental osteoarthritic changes with degenerative tearing of the medial and lateral meniscus Advanced arthritis.Baker cyst noted  Patient states that she does have some instability of the knee as well.continues to have pain. Patient is very happy that there is no true mass.patient feels that the mass is much causing more of a discomfort. States that it is affecting daily activities and makes it difficult to do anything more than walking.      Past Medical History:  Diagnosis Date  . Acid reflux   . Breast cancer ([place]) 11/23/2007   right, ER/PR -, HER 2 -  . Cancer [facility])    breast right  triple recet neg, lumpect, chemo, XRT  . Esophageal stricture   . Gastritis 10/2007  . History of chemotherapy    neoadjuvant chemo, last dose 03/2008  . History of radiation therapy 07/25/08 -09/11/08   right breast  . Hyperlipidemia   . Migraines    rare  . Osteopenia    Past Surgical History:  Procedure Laterality Date  . BREAST LUMPECTOMY  06/26/2008   right, CA   XRT, chemo   Social History   Social History  . Marital status: Married    Spouse name: N/A  . Number of children: 3  . Years of education: N/A   Occupational History  .  Retired   Social History Main Topics  . Smoking status: Former Smoker    Years: 5.00    Types: Cigarettes    Quit date: 03/03/1967  . Smokeless tobacco: Never Used     Comment: 6 cig per day when she was smoking/quit years ago  . Alcohol use No  . Drug use: No  . Sexual activity:  Yes    Partners: Male    Birth control/ protection: Post-menopausal   Other Topics Concern  . None   Social History Narrative   G3, P3, 1st pregnancy age 18, menopause age 89, no HRT   Allergies  Allergen Reactions  . Ibandronate Sodium     REACTION: intolerance--BONIVA  . Ibandronic Acid Nausea Only  . Lipitor [Atorvastatin]     arthralgias   Family History  Problem Relation Age of Onset  . Heart disease Father   . Cancer Father     lung  . Cancer Sister 19    Breast & Colorectal  . Epilepsy Mother   . Dementia Mother     Past medical history, social, surgical and family history all reviewed in electronic medical record.  No pertanent information unless stated regarding to the chief complaint.   Review of Systems: No headache, visual changes, nausea, vomiting, diarrhea, constipation, dizziness, abdominal pain, skin rash, fevers, chills, night sweats, weight loss, swollen lymph nodes, body aches, joint swelling, muscle aches, chest pain, shortness of breath, mood changes.   Objective  Blood pressure 124/80, pulse 61, weight 124 lb (56.2 kg), last menstrual period 03/02/1994, SpO2 96 %.  General: No apparent distress alert and oriented x3 mood and affect normal, dressed appropriately.  HEENT: Pupils equal, extraocular movements intact  Respiratory:  Patient's speak in full sentences and does not appear short of breath  Cardiovascular: No lower extremity edema, non tender, no erythema  Skin: Warm dry intact with no signs of infection or rash on extremities or on axial skeleton.  Abdomen: Soft nontender  Neuro: Cranial nerves II through XII are intact, neurovascularly intact in all extremities with 2+ DTRs and 2+ pulses.  Lymph: No lymphadenopathy of posterior or anterior cervical chain or axillae bilaterally.  Gait normal with good balance and coordination.  MSK:  Non tender with full range of motion and good stability and symmetric strength and tone of shoulders, elbows,  wrist, hip, and ankles bilaterally.  Knee:right Significant valgus deformity of the right knee compared to the contralateral side Patient does have a mass that is in the posterior medial aspect of the popliteal area. Minorly tender to palpation Lacks last 10-15 of flexion Instability with valgus force minorly positiveMcmurray's, Apley's, and Thessalonian tests. Non painful patellar compression. Patellar glide without crepitus. Patellar and quadriceps tendons unremarkable. Hamstring and quadriceps strength is normal.  Contralateral knee has osteoarthritic changes as well but nontender    Procedure: Real-time Ultrasound Guided Injection of right Baker cyst aspiration Device: GE Logiq E  Ultrasound guided injection is preferred based studies that show increased duration, increased effect, greater accuracy, decreased procedural pain, increased response rate, and decreased cost with ultrasound guided versus blind injection.  Verbal informed consent obtained.  Time-out conducted.  Noted no overlying erythema, induration, or other signs of local infection.  Skin prepped in a sterile fashion.  Local anesthesia: Topical Ethyl chloride.  With sterile technique and under real time ultrasound guidance: With a 18-gauge 2 inch needle patient wasinjected with 2 mL of 0.5% Marcaine. Patient did have aspiration of 25 mL of strawlike color fluid. Patient tolerated the procedure well. Then injected 1 mL of Kenalog 40 mg/dL. Completed without difficulty  Pain immediately resolved suggesting accurate placement of the medication.  Advised to call if fevers/chills, erythema, induration, drainage, or persistent bleeding.  Images permanently stored and available for review in the ultrasound unit.  Impression: Technically successful ultrasound guided injection.     Impression and Recommendations:     This case required medical decision making of moderate complexity.      Note: This dictation was  prepared with Dragon dictation along with smaller phrase technology. Any transcriptional errors that result from this process are unintentional.

## 2015-10-09 NOTE — Assessment & Plan Note (Signed)
Patient's MRI shows the patient does have more severe osteophytic changes in appreciated on x-ray or even ultrasound. Patient will try over-the-counter medications, icing, home exercises, and will be fitted for custom brace. Follow-up in 3 weeks when she could be a candidate for viscous supplementation.

## 2015-10-11 ENCOUNTER — Ambulatory Visit: Payer: PPO | Admitting: Family Medicine

## 2015-10-11 ENCOUNTER — Inpatient Hospital Stay: Admission: RE | Admit: 2015-10-11 | Payer: PPO | Source: Ambulatory Visit

## 2015-10-11 ENCOUNTER — Encounter: Payer: Self-pay | Admitting: Family Medicine

## 2015-10-11 DIAGNOSIS — M1711 Unilateral primary osteoarthritis, right knee: Secondary | ICD-10-CM

## 2015-10-13 ENCOUNTER — Other Ambulatory Visit: Payer: PPO

## 2015-10-14 ENCOUNTER — Telehealth: Payer: Self-pay | Admitting: Family Medicine

## 2015-10-14 NOTE — Telephone Encounter (Signed)
Spoke with pt, scheduled her for tomorrow @ 10am with Dr. Tamala Julian.

## 2015-10-14 NOTE — Progress Notes (Signed)
[name] [name], [name] [address] Phone: [phone] Subjective:     CC: right knee pain f/u  [id number]  Brittany [name] is a 70 y.o. female coming in with complaint of Right knee pain. Patient was found to have a Baker cyst. Patient was aspirated 2 weeks ago. Patient states it started coming back again. Patient is continuing to have the pain. Patient feels that though the pain as more of the instability of the knee as well as the posterior aspect of the knee secondary to assess.   Being fitted for custom brace  Previous imaging showed  MRI was independently visualized by me. MRI showed advanced tricompartmental osteoarthritic changes with degenerative tearing of the medial and lateral meniscus Advanced arthritis.Baker cyst noted      Past Medical History:  Diagnosis Date  . Acid reflux   . Breast cancer ([place]) 11/23/2007   right, ER/PR -, HER 2 -  . Cancer [facility])    breast right  triple recet neg, lumpect, chemo, XRT  . Esophageal stricture   . Gastritis 10/2007  . History of chemotherapy    neoadjuvant chemo, last dose 03/2008  . History of radiation therapy 07/25/08 -09/11/08   right breast  . Hyperlipidemia   . Migraines    rare  . Osteopenia    Past Surgical History:  Procedure Laterality Date  . BREAST LUMPECTOMY  06/26/2008   right, CA   XRT, chemo   Social History   Social History  . Marital status: Married    Spouse name: N/A  . Number of children: 3  . Years of education: N/A   Occupational History  .  Retired   Social History Main Topics  . Smoking status: Former Smoker    Years: 5.00    Types: Cigarettes    Quit date: 03/03/1967  . Smokeless tobacco: Never Used     Comment: 6 cig per day when she was smoking/quit years ago  . Alcohol use No  . Drug use: No  . Sexual activity: Yes    Partners: Male    Birth control/ protection: Post-menopausal   Other Topics Concern  . Not on file   Social  History Narrative   G3, P3, 1st pregnancy age 62, menopause age 26, no HRT   Allergies  Allergen Reactions  . Ibandronate Sodium     REACTION: intolerance--BONIVA  . Ibandronic Acid Nausea Only  . Lipitor [Atorvastatin]     arthralgias   Family History  Problem Relation Age of Onset  . Heart disease Father   . Cancer Father     lung  . Cancer Sister 1    Breast & Colorectal  . Epilepsy Mother   . Dementia Mother     Past medical history, social, surgical and family history all reviewed in electronic medical record.  No pertanent information unless stated regarding to the chief complaint.   Review of Systems: No headache, visual changes, nausea, vomiting, diarrhea, constipation, dizziness, abdominal pain, skin rash, fevers, chills, night sweats, weight loss, swollen lymph nodes, body aches, joint swelling, muscle aches, chest pain, shortness of breath, mood changes.   Objective  Last menstrual period 03/02/1994.  General: No apparent distress alert and oriented x3 mood and affect normal, dressed appropriately.  HEENT: Pupils equal, extraocular movements intact  Respiratory: Patient's speak in full sentences and does not appear short of breath  Cardiovascular: No lower extremity edema, non tender, no erythema  Skin: Warm  dry intact with no signs of infection or rash on extremities or on axial skeleton.  Abdomen: Soft nontender  Neuro: Cranial nerves II through XII are intact, neurovascularly intact in all extremities with 2+ DTRs and 2+ pulses.  Lymph: No lymphadenopathy of posterior or anterior cervical chain or axillae bilaterally.  Gait normal with good balance and coordination.  MSK:  Non tender with full range of motion and good stability and symmetric strength and tone of shoulders, elbows, wrist, hip, and ankles bilaterally.  Knee:right Significant valgus deformity of the right knee compared to the contralateral side Patient does have a mass that is in the posterior  medial aspect of the popliteal area. Minorly tender to palpation, smaller than previous exam Lacks last 10-15 of flexion Instability with valgus force minorly positiveMcmurray's, Apley's, and Thessalonian tests. Non painful patellar compression. Patellar glide without crepitus. Patellar and quadriceps tendons unremarkable. Hamstring and quadriceps strength is normal.  Contralateral knee has osteoarthritic changes as well but nontender    Procedure: Real-time Ultrasound Guided Injection of right Baker cyst aspiration Device: GE Logiq E  Ultrasound guided injection is preferred based studies that show increased duration, increased effect, greater accuracy, decreased procedural pain, increased response rate, and decreased cost with ultrasound guided versus blind injection.  Verbal informed consent obtained.  Time-out conducted.  Noted no overlying erythema, induration, or other signs of local infection.  Skin prepped in a sterile fashion.  Local anesthesia: Topical Ethyl chloride.  With sterile technique and under real time ultrasound guidance: With a 21-gauge 2 inch needle patient wasinjected with 2 mL of 0.5% Marcaine. Patient did have aspiration of 12 mL of strawlike color fluid. Patient tolerated the procedure well. Then injected 1 mL of Kenalog 40 mg/dL. Completed without difficulty  Pain immediately resolved suggesting accurate placement of the medication.  Advised to call if fevers/chills, erythema, induration, drainage, or persistent bleeding.  Images permanently stored and available for review in the ultrasound unit.  Impression: Technically successful ultrasound guided injection.     Impression and Recommendations:     This case required medical decision making of moderate complexity.      Note: This dictation was prepared with Dragon dictation along with smaller phrase technology. Any transcriptional errors that result from this process are unintentional.

## 2015-10-14 NOTE — Telephone Encounter (Signed)
Patient called in to request an appointment today or tomorrow. She is leaving town Wednesday, and I have no availability on file till then. She states that she has another bakers cyst she needs drained. Please advise. .  She is coming in tomorrow at 9:45 tomorrow for a custom brace fitting

## 2015-10-15 ENCOUNTER — Other Ambulatory Visit: Payer: Self-pay

## 2015-10-15 ENCOUNTER — Ambulatory Visit (INDEPENDENT_AMBULATORY_CARE_PROVIDER_SITE_OTHER): Payer: PPO | Admitting: Family Medicine

## 2015-10-15 DIAGNOSIS — M25561 Pain in right knee: Secondary | ICD-10-CM

## 2015-10-15 DIAGNOSIS — M1711 Unilateral primary osteoarthritis, right knee: Secondary | ICD-10-CM

## 2015-10-15 NOTE — Assessment & Plan Note (Signed)
Baker cyst aspirated again today. I do feel that patient's end-stage osteophytic changes that likely what suture minimal store pain. I do feel that the brace will help with the instability. We discussed with patient again about possible viscous supplementation but she wanted to do the aspiration and stent. Depending on how patient does she will come back and we'll start viscous supple mentation at that time.

## 2015-10-15 NOTE — Patient Instructions (Addendum)
Good to see you  It keeps coming back! We can try other injections and we should in 2 weeks.  Otherwise consider compression sleeve until you get brace

## 2015-10-21 ENCOUNTER — Ambulatory Visit: Payer: PPO | Attending: Family Medicine | Admitting: Physical Therapy

## 2015-10-21 DIAGNOSIS — M6281 Muscle weakness (generalized): Secondary | ICD-10-CM | POA: Diagnosis not present

## 2015-10-21 DIAGNOSIS — R262 Difficulty in walking, not elsewhere classified: Secondary | ICD-10-CM

## 2015-10-21 DIAGNOSIS — M25561 Pain in right knee: Secondary | ICD-10-CM

## 2015-10-22 NOTE — Therapy (Addendum)
[place], [place], [address] Phone: [phone]   Fax:  [phone]  Physical Therapy Evaluation  Patient Details  Name: Brittany [name] MRN: [id number] Date of Birth: [birth year] Referring Provider: Dr Hulan Saas   Encounter Date: 10/21/2015      PT End of Session - 10/21/15 1322    Visit Number 1   Number of Visits 12   Date for PT Re-Evaluation 12/02/15   Authorization Type [employer].    PT Start Time 1015   PT Stop Time 1100   PT Time Calculation (min) 45 min   Activity Tolerance Patient tolerated treatment well   Behavior During Therapy WFL for tasks assessed/performed      Past Medical History:  Diagnosis Date  . Acid reflux   . Breast cancer ([place]) 11/23/2007   right, ER/PR -, HER 2 -  . Cancer [facility])    breast right  triple recet neg, lumpect, chemo, XRT  . Esophageal stricture   . Gastritis 10/2007  . History of chemotherapy    neoadjuvant chemo, last dose 03/2008  . History of radiation therapy 07/25/08 -09/11/08   right breast  . Hyperlipidemia   . Migraines    rare  . Osteopenia     Past Surgical History:  Procedure Laterality Date  . BREAST LUMPECTOMY  06/26/2008   right, CA   XRT, chemo    There were no vitals filed for this visit.       Subjective Assessment - 10/21/15 1027    Subjective Patient reports pain when she is standing on her right leg for a long period of time she begins to have pain. She has a bakers cyst. She has had it drained but it did not help. She will be having a shot on Monday to improve lubrication in hte knee.    Pertinent History right knee pain    Limitations Standing;Walking   How long can you sit comfortably? no limite    How long can you stand comfortably? > 20 minutes    How long can you walk comfortably? limited community distances    Diagnostic tests MRI: degeneration of both meniscus    Patient Stated Goals to have less pain. To  return to the gym.    Currently in Pain? Yes   Pain Score 3    Pain Location Knee   Pain Orientation Right   Pain Descriptors / Indicators Aching   Pain Type Surgical pain   Pain Onset More than a month ago   Pain Frequency Rarely   Aggravating Factors  standing, walking    Pain Relieving Factors rest    Effect of Pain on Daily Activities difciulty walking longer distances             [facility] PT Assessment - 10/22/15 0001      Assessment   Medical Diagnosis right knee pain   Referring Provider Dr Hulan Saas    Onset Date/Surgical Date --  > 1 year   Hand Dominance Right   Next MD Visit 10/28/2015   Prior Therapy N     Precautions   Precautions None     Restrictions   Weight Bearing Restrictions No     Home Environment   Additional Comments Nothing significant      Prior Function   Level of Independence Independent     Cognition   Overall Cognitive Status Within Functional Limits for tasks assessed  Observation/Other Assessments   Observations Significant R knee valgus in standing    Focus on Therapeutic Outcomes (FOTO)  Not given by front      Sensation   Light Touch Appears Intact     Posture/Postural Control   Posture Comments Nothing significant      AROM   Overall AROM Comments full active ROM slight pain in posterior at end range      PROM   Overall PROM Comments full PROM      Strength   Overall Strength Comments 5/5 gross left lower extremity    Right Hip Extension 4/5   Right Hip ABduction 4+/5   Right Hip ADduction 5/5   Right Knee Flexion 5/5   Right Knee Extension 4+/5   Right Ankle Dorsiflexion 5/5     Palpation   Palpation comment No significant tenderness palpation      Ambulation/Gait   Gait Comments right lower extremity valgus                    [facility] Adult PT Treatment/Exercise - 10/22/15 0001      Knee/Hip Exercises: Standing   Heel Raises Limitations x10   Hip Flexion Limitations x10    SLS 3x10 sec  hold      Knee/Hip Exercises: Seated   Long Arc Quad Limitations right 2x10      Knee/Hip Exercises: Supine   Bridges Limitations x10    Straight Leg Raises Limitations x10                 PT Education - 10/21/15 1322    Education provided Yes   Education Details Gave patient initial HEP. Spoke with the patient about exercise progression.    Person(s) Educated Patient   Methods Explanation   Comprehension Verbalized understanding;Returned demonstration          PT Short Term Goals - 10/22/15 0912      PT SHORT TERM GOAL #1   Title Patient will demsotrate full pain free active right knee flexion    Time 4   Period Weeks   Status New     PT SHORT TERM GOAL #2   Title Patient will increase right single leg stance    Time 4   Period Weeks   Status New     PT SHORT TERM GOAL #3   Title Patient will demsotrate 5/5 gross right lower extremity strength    Time 4   Period Weeks   Status New     PT SHORT TERM GOAL #4   Title Patient will be Independnet with initial HEP    Time 4   Period Weeks   Status New           PT Long Term Goals - 10/22/15 0916      PT LONG TERM GOAL #1   Title Patient will ambualte for 1 mile without increased pain in order to go shopping   Time 6   Period Weeks   Status New     PT LONG TERM GOAL #2   Title Patient will stand for 1 hour without increased pain in order to perfrom her ADL's   Time 6   Period Weeks   Status New     PT LONG TERM GOAL #3   Title Patient will return to the gym with a good program to improve strength and stability of the right knee.    Time 6   Period Weeks   Status New  Plan - 10/22/15 0902    Clinical Impression Statement Paatient is a 70 year old female with right lower extremity pain. Her MRI shoed medial and lateral meniscal damage as well as well as a bkaers cyst. she has had the cyst drained several times. She prsents with decreased right knee single leg stability,  decreased strength, and increased pain with functional activity. She would like to come for 2-3 visits to get an exercise program then proceed on her own. She would benefit from skilled therapy to adress the above deficits.     Rehab Potential Good   PT Frequency 2x / week   PT Duration 6 weeks   PT Treatment/Interventions ADLs/Self Care Home Management;Cryotherapy;Iontophoresis 75m/ml Dexamethasone;Electrical Stimulation;Ultrasound;Gait training;Stair training;Functional mobility training;DME Instruction;Patient/family education;Passive range of motion;Neuromuscular re-education;Therapeutic exercise;Therapeutic activities;Visual/perceptual remediation/compensation   PT Next Visit Plan add steps, leg press, mini squats, laq and hamstring curl with light band; lateral band walk with light band, exercises bike  any other quad strengthening exercises that would be beneficial but not overly stressfull.    PT Home Exercise Plan quad set, SAQ, SLR, briding, laq, single leg stance, standing march, hamstring stretch IT band stretch    Consulted and Agree with Plan of Care Patient      Patient will benefit from skilled therapeutic intervention in order to improve the following deficits and impairments:  Pain, Abnormal gait, Decreased range of motion, Difficulty walking, Decreased strength, Decreased mobility  Visit Diagnosis: Pain in right knee - Plan: PT plan of care cert/re-cert  Muscle weakness (generalized) - Plan: PT plan of care cert/re-cert  Difficulty in walking, not elsewhere classified - Plan: PT plan of care cert/re-cert      G-Codes - [id number]   Functional Limitation Mobility: Walking and moving around   Mobility: Walking and Moving Around Current Status [phone] At least 20 percent but less than 40 percent impaired, limited or restricted   Mobility: Walking and Moving Around Goal Status [phone] At least 1 percent but less than 20 percent impaired, limited or restricted      PHYSICAL THERAPY DISCHARGE SUMMARY  Visits from Start of Care: 1  Current functional level related to goals / functional outcomes: Patient did not return for follow up    Remaining deficits: Unknown    Education / Equipment: Unknown  Plan: Patient agrees to discharge.  Patient goals were not met. Patient is being discharged due to meeting the stated rehab goals.  ?????      Problem List Patient Active Problem List   Diagnosis Date Noted  . Baker's cyst, unruptured 09/27/2015  . Degenerative arthritis of right knee 09/27/2015  . Right knee pain 07/31/2015  . Insomnia 07/31/2015  . Onychomycosis 09/25/2014  . Cognitive deficits 09/25/2014  . Urinary frequency 05/21/2014  . Mitral valve prolapse 05/08/2014  . Mitral regurgitation 05/08/2014  . Breast cancer, right breast ([facility] 03/12/2014  . Burning with urination 03/06/2014  . Acute cystitis with positive culture 03/06/2014  . Chest pain at rest 02/09/2014  . Dyslipidemia 02/09/2014  . Myalgia 07/18/2013  . History of radiation therapy   . Hiatal hernia   . History of chemotherapy   . Osteoporosis 03/21/2012  . Seasonal and perennial allergic rhinitis 12/17/2011  . Well adult exam 11/11/2010  . B12 deficiency 10/09/2009  . TOBACCO USE, QUIT 01/31/2009  . THYROID NODULE 12/25/2008  . ESOPHAGEAL STRICTURE 11/23/2007  . GERD 11/23/2007  . VARICOSE VEIN 12/29/2006  . Anxiety state 12/23/2006  . Depression 12/23/2006  .  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 Disorder of bone and cartilage 12/23/2006    Carney Living PT DPT  10/22/2015, 9:23 AM  [facility] [address] [place], [place], [address] Phone: [phone]   Fax:  [phone]  Name: Brittany [name] MRN: [id number] Date of Birth: 02-11-1946

## 2015-10-26 NOTE — Progress Notes (Signed)
[name] [place], [place] [address] Phone: [phone] Subjective:     CC: right knee pain f/u  [id number]  Brittany [name] is a 70 y.o. female coming in with complaint of Right knee pain. Patient was found to have a Baker cyst. Patient was aspirated 2 weeks ago. Patient states it started coming back again. Patient is continuing to have the pain. Patient feels that though the pain as more of the instability of the knee as well as the posterior aspect of the knee secondary to assess.Patient states that the reaccumulation of the cyst is a again. States that she has not notice any significant improvement with going to formal physical therapy. States that she is no better than any treatment we have done at this time.   Being fitted for custom brace  Previous imaging showed  MRI was independently visualized by me. MRI showed advanced tricompartmental osteoarthritic changes with degenerative tearing of the medial and lateral meniscus Advanced arthritis.Baker cyst noted      Past Medical History:  Diagnosis Date  . Acid reflux   . Breast cancer ([place]) 11/23/2007   right, ER/PR -, HER 2 -  . Cancer [facility])    breast right  triple recet neg, lumpect, chemo, XRT  . Esophageal stricture   . Gastritis 10/2007  . History of chemotherapy    neoadjuvant chemo, last dose 03/2008  . History of radiation therapy 07/25/08 -09/11/08   right breast  . Hyperlipidemia   . Migraines    rare  . Osteopenia    Past Surgical History:  Procedure Laterality Date  . BREAST LUMPECTOMY  06/26/2008   right, CA   XRT, chemo   Social History   Social History  . Marital status: Married    Spouse name: N/A  . Number of children: 3  . Years of education: N/A   Occupational History  .  Retired   Social History Main Topics  . Smoking status: Former Smoker    Years: 5.00    Types: Cigarettes    Quit date: 03/03/1967  . Smokeless tobacco: Never Used   Comment: 6 cig per day when she was smoking/quit years ago  . Alcohol use No  . Drug use: No  . Sexual activity: Yes    Partners: Male    Birth control/ protection: Post-menopausal   Other Topics Concern  . None   Social History Narrative   G3, P3, 1st pregnancy age 78, menopause age 51, no HRT   Allergies  Allergen Reactions  . Ibandronate Sodium     REACTION: intolerance--BONIVA  . Ibandronic Acid Nausea Only  . Lipitor [Atorvastatin]     arthralgias   Family History  Problem Relation Age of Onset  . Heart disease Father   . Cancer Father     lung  . Cancer Sister 72    Breast & Colorectal  . Epilepsy Mother   . Dementia Mother     Past medical history, social, surgical and family history all reviewed in electronic medical record.  No pertanent information unless stated regarding to the chief complaint.   Review of Systems: No headache, visual changes, nausea, vomiting, diarrhea, constipation, dizziness, abdominal pain, skin rash, fevers, chills, night sweats, weight loss, swollen lymph nodes, body aches, joint swelling, muscle aches, chest pain, shortness of breath, mood changes.   Objective  Blood pressure 112/64, pulse 93, weight 126 lb (57.2 kg), last menstrual period 03/02/1994, SpO2 96 %.  General: No apparent distress alert and oriented x3 mood and affect normal, dressed appropriately.  HEENT: Pupils equal, extraocular movements intact  Respiratory: Patient's speak in full sentences and does not appear short of breath  Cardiovascular: No lower extremity edema, non tender, no erythema  Skin: Warm dry intact with no signs of infection or rash on extremities or on axial skeleton.  Abdomen: Soft nontender  Neuro: Cranial nerves II through XII are intact, neurovascularly intact in all extremities with 2+ DTRs and 2+ pulses.  Lymph: No lymphadenopathy of posterior or anterior cervical chain or axillae bilaterally.  Gait normal with good balance and coordination.    MSK:  Non tender with full range of motion and good stability and symmetric strength and tone of shoulders, elbows, wrist, hip, and ankles bilaterally.  Knee:right Significant valgus deformity of the right knee compared to the contralateral side Patient does have a mass that is in the posterior medial aspect of the popliteal area. Minorly tender to palpation, smaller than previous exam Lacks last 10-15 of flexion Instability with valgus force minorly positiveMcmurray's, Apley's, and Thessalonian tests. Non painful patellar compression. Patellar glide without crepitus. Patellar and quadriceps tendons unremarkable. Hamstring and quadriceps strength is normal.  Contralateral knee has osteoarthritic changes as well but nontender No significant change from previous exam        Impression and Recommendations:     This case required medical decision making of moderate complexity.      Note: This dictation was prepared with Dragon dictation along with smaller phrase technology. Any transcriptional errors that result from this process are unintentional.

## 2015-10-28 ENCOUNTER — Encounter: Payer: Self-pay | Admitting: Family Medicine

## 2015-10-28 ENCOUNTER — Ambulatory Visit (INDEPENDENT_AMBULATORY_CARE_PROVIDER_SITE_OTHER): Payer: PPO | Admitting: Family Medicine

## 2015-10-28 ENCOUNTER — Other Ambulatory Visit: Payer: Self-pay

## 2015-10-28 VITALS — BP 112/64 | HR 93 | Wt 126.0 lb

## 2015-10-28 DIAGNOSIS — G5621 Lesion of ulnar nerve, right upper limb: Secondary | ICD-10-CM

## 2015-10-28 DIAGNOSIS — M7121 Synovial cyst of popliteal space [Baker], right knee: Secondary | ICD-10-CM

## 2015-10-28 DIAGNOSIS — M1711 Unilateral primary osteoarthritis, right knee: Secondary | ICD-10-CM | POA: Diagnosis not present

## 2015-10-28 NOTE — Assessment & Plan Note (Signed)
Patient does have severe arthritis. Bone on bone in nature. Patient has recurrent Baker cyst. Instability of the knee noted. Patient at this point could have viscous supplementation but I am not optimistic and patient will respond. Patient will be referred to orthopedic surgery further evaluation for knee replacement.

## 2015-10-28 NOTE — Patient Instructions (Addendum)
Good to see you  We started

## 2015-10-29 ENCOUNTER — Encounter: Payer: Self-pay | Admitting: *Deleted

## 2015-10-29 ENCOUNTER — Ambulatory Visit: Payer: PPO | Admitting: Physical Therapy

## 2015-11-05 ENCOUNTER — Encounter: Payer: PPO | Admitting: Physical Therapy

## 2015-11-05 DIAGNOSIS — H2513 Age-related nuclear cataract, bilateral: Secondary | ICD-10-CM | POA: Diagnosis not present

## 2015-11-05 DIAGNOSIS — H04123 Dry eye syndrome of bilateral lacrimal glands: Secondary | ICD-10-CM | POA: Diagnosis not present

## 2015-11-05 DIAGNOSIS — H10413 Chronic giant papillary conjunctivitis, bilateral: Secondary | ICD-10-CM | POA: Diagnosis not present

## 2015-11-11 ENCOUNTER — Encounter: Payer: PPO | Admitting: Physical Therapy

## 2015-11-11 ENCOUNTER — Ambulatory Visit: Payer: PPO | Admitting: Family Medicine

## 2015-11-18 ENCOUNTER — Encounter: Payer: PPO | Admitting: Physical Therapy

## 2015-11-22 DIAGNOSIS — M1711 Unilateral primary osteoarthritis, right knee: Secondary | ICD-10-CM | POA: Diagnosis not present

## 2015-11-22 DIAGNOSIS — M25561 Pain in right knee: Secondary | ICD-10-CM | POA: Diagnosis not present

## 2015-11-25 NOTE — Progress Notes (Signed)
Scheduling for pre op- please place ORDERS IN EPIC  thanks

## 2015-11-26 NOTE — Progress Notes (Signed)
Scheduling pre op- please place SURGICAL ORDERS IN EPIC  thanks

## 2015-12-18 NOTE — H&P (Signed)
TOTAL KNEE ADMISSION H&P  Patient is being admitted for right total knee arthroplasty.  Subjective:  Chief Complaint:     Right knee primary OA / pain  HPI: Brittany [name], 70 y.o. female, has a history of pain and functional disability in the right knee due to arthritis and has failed non-surgical conservative treatments for greater than 12 weeks to include NSAID's and/or analgesics, corticosteriod injections and activity modification.  Onset of symptoms was gradual, starting 6-8 months ago with rapidlly worsening course since that time. The patient noted prior procedures on the knee to include  mass removal from posterior aspect of knee on the right knee(s).  Patient currently rates pain in the right knee(s) at 8 out of 10 with activity. Patient has night pain, worsening of pain with activity and weight bearing, pain that interferes with activities of daily living, pain with passive range of motion, crepitus and joint swelling.  Patient has evidence of periarticular osteophytes and joint space narrowing by imaging studies.  There is no active infection.   Risks, benefits and expectations were discussed with the patient.  Risks including but not limited to the risk of anesthesia, blood clots, nerve damage, blood vessel damage, failure of the prosthesis, infection and up to and including death.  Patient understand the risks, benefits and expectations and wishes to proceed with surgery.    PCP: Walker Kehr, MD  D/C Plans:      Home with HHPT  Post-op Meds:       No Rx given  Tranexamic Acid:      To be given - IV   Decadron:      Is to be given  FYI:     ASA  Norco    Patient Active Problem List   Diagnosis Date Noted  . Baker's cyst, unruptured 09/27/2015  . Degenerative arthritis of right knee 09/27/2015  . Right knee pain 07/31/2015  . Insomnia 07/31/2015  . Onychomycosis 09/25/2014  . Cognitive deficits 09/25/2014  . Urinary frequency 05/21/2014  . Mitral valve prolapse  05/08/2014  . Mitral regurgitation 05/08/2014  . Breast cancer, right breast ([place]) 03/12/2014  . Burning with urination 03/06/2014  . Acute cystitis with positive culture 03/06/2014  . Chest pain at rest 02/09/2014  . Dyslipidemia 02/09/2014  . Myalgia 07/18/2013  . History of radiation therapy   . Hiatal hernia   . History of chemotherapy   . Osteoporosis 03/21/2012  . Seasonal and perennial allergic rhinitis 12/17/2011  . Well adult exam 11/11/2010  . B12 deficiency 10/09/2009  . TOBACCO USE, QUIT 01/31/2009  . THYROID NODULE 12/25/2008  . ESOPHAGEAL STRICTURE 11/23/2007  . GERD 11/23/2007  . VARICOSE VEIN 12/29/2006  . Anxiety state 12/23/2006  . Depression 12/23/2006  . 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 Disorder of bone and cartilage 12/23/2006   Past Medical History:  Diagnosis Date  . Acid reflux   . Breast cancer ([place]) 11/23/2007   right, ER/PR -, HER 2 -  . Cancer [facility])    breast right  triple recet neg, lumpect, chemo, XRT  . Esophageal stricture   . Gastritis 10/2007  . History of chemotherapy    neoadjuvant chemo, last dose 03/2008  . History of radiation therapy 07/25/08 -09/11/08   right breast  . Hyperlipidemia   . Migraines    rare  . Osteopenia     Past Surgical History:  Procedure Laterality Date  . BREAST LUMPECTOMY  06/26/2008   right, CA  XRT, chemo    No prescriptions prior to admission.   Allergies  Allergen Reactions  . Ibandronate Sodium     REACTION: intolerance--BONIVA  . Ibandronic Acid Nausea Only  . Lipitor [Atorvastatin]     arthralgias    Social History  Substance Use Topics  . Smoking status: Former Smoker    Years: 5.00    Types: Cigarettes    Quit date: 03/03/1967  . Smokeless tobacco: Never Used     Comment: 6 cig per day when she was smoking/quit years ago  . Alcohol use No    Family History  Problem Relation Age of Onset  . Heart disease Father   . Cancer Father     lung  . Cancer Sister 47     Breast & Colorectal  . Epilepsy Mother   . Dementia Mother      Review of Systems  Constitutional: Negative.   Eyes: Negative.   Respiratory: Negative.   Cardiovascular: Negative.   Gastrointestinal: Positive for heartburn.  Genitourinary: Positive for frequency.  Musculoskeletal: Positive for joint pain.  Skin: Negative.   Neurological: Positive for headaches.  Endo/Heme/Allergies: Positive for environmental allergies.  Psychiatric/Behavioral: Positive for depression. The patient is nervous/anxious and has insomnia.     Objective:  Physical Exam  Constitutional: She is oriented to person, place, and time. She appears well-developed.  HENT:  Head: Normocephalic.  Eyes: Pupils are equal, round, and reactive to light.  Neck: Neck supple. No JVD present. No tracheal deviation present. No thyromegaly present.  Cardiovascular: Normal rate, regular rhythm, normal heart sounds and intact distal pulses.   Respiratory: Effort normal and breath sounds normal. No respiratory distress. She has no wheezes.  GI: Soft. There is no tenderness. There is no guarding.  Musculoskeletal:       Right knee: She exhibits decreased range of motion, swelling and bony tenderness. She exhibits no ecchymosis, no deformity, no laceration, no erythema and normal alignment. Tenderness found.  Lymphadenopathy:    She has no cervical adenopathy.  Neurological: She is alert and oriented to person, place, and time.  Skin: Skin is warm and dry.  Psychiatric: She has a normal mood and affect.     Labs:  Estimated body mass index is 19.73 kg/m as calculated from the following:   Height as of 09/27/15: 5\' 7"  (1.702 m).   Weight as of 10/28/15: 57.2 kg (126 lb).   Imaging Review Plain radiographs demonstrate severe degenerative joint disease of the right knee(s). The overall alignment is mild valgus. The bone quality appears to be good for age and reported activity level.  Assessment/Plan:  End stage  arthritis, right knee   The patient history, physical examination, clinical judgment of the provider and imaging studies are consistent with end stage degenerative joint disease of the right knee(s) and total knee arthroplasty is deemed medically necessary. The treatment options including medical management, injection therapy arthroscopy and arthroplasty were discussed at length. The risks and benefits of total knee arthroplasty were presented and reviewed. The risks due to aseptic loosening, infection, stiffness, patella tracking problems, thromboembolic complications and other imponderables were discussed. The patient acknowledged the explanation, agreed to proceed with the plan and consent was signed. Patient is being admitted for inpatient treatment for surgery, pain control, PT, OT, prophylactic antibiotics, VTE prophylaxis, progressive ambulation and ADL's and discharge planning. The patient is planning to be discharged home with home health services.     West Pugh Latravis Grine   PA-C  12/18/2015, 3:00  PM

## 2015-12-23 ENCOUNTER — Encounter (HOSPITAL_COMMUNITY): Payer: Self-pay

## 2015-12-23 DIAGNOSIS — H2512 Age-related nuclear cataract, left eye: Secondary | ICD-10-CM | POA: Diagnosis not present

## 2015-12-24 ENCOUNTER — Encounter: Payer: Self-pay | Admitting: Internal Medicine

## 2015-12-24 ENCOUNTER — Other Ambulatory Visit: Payer: Self-pay | Admitting: Internal Medicine

## 2015-12-24 MED ORDER — TRAZODONE HCL 50 MG PO TABS: 25.0000 mg | ORAL_TABLET | Freq: Every evening | ORAL | 3 refills | Status: DC | PRN

## 2015-12-25 ENCOUNTER — Encounter: Payer: Self-pay | Admitting: Internal Medicine

## 2015-12-26 ENCOUNTER — Encounter (HOSPITAL_COMMUNITY)
Admission: RE | Admit: 2015-12-26 | Discharge: 2015-12-26 | Disposition: A | Discharge status: Home / Self Care | Payer: PPO | Source: Ambulatory Visit | Attending: Orthopedic Surgery | Admitting: Orthopedic Surgery

## 2015-12-26 ENCOUNTER — Encounter (HOSPITAL_COMMUNITY): Payer: Self-pay

## 2015-12-26 DIAGNOSIS — M1711 Unilateral primary osteoarthritis, right knee: Secondary | ICD-10-CM | POA: Diagnosis not present

## 2015-12-26 DIAGNOSIS — Z01812 Encounter for preprocedural laboratory examination: Secondary | ICD-10-CM | POA: Diagnosis not present

## 2015-12-26 DIAGNOSIS — Z01810 Encounter for preprocedural cardiovascular examination: Secondary | ICD-10-CM | POA: Diagnosis not present

## 2015-12-26 HISTORY — DX: Unspecified osteoarthritis, unspecified site: M19.90

## 2015-12-26 LAB — CBC
HEMATOCRIT: 37.1 % (ref 36.0–46.0)
Hemoglobin: 11.5 g/dL — ABNORMAL LOW (ref 12.0–15.0)
MCH: 27.1 pg (ref 26.0–34.0)
MCHC: 31 g/dL (ref 30.0–36.0)
MCV: 87.3 fL (ref 78.0–100.0)
Platelets: 369 10*3/uL (ref 150–400)
RBC: 4.25 MIL/uL (ref 3.87–5.11)
RDW: 15.3 % (ref 11.5–15.5)
WBC: 5.4 10*3/uL (ref 4.0–10.5)

## 2015-12-26 LAB — BASIC METABOLIC PANEL
ANION GAP: 6 (ref 5–15)
BUN: 11 mg/dL (ref 6–20)
CO2: 27 mmol/L (ref 22–32)
Calcium: 9.5 mg/dL (ref 8.9–10.3)
Chloride: 103 mmol/L (ref 101–111)
Creatinine, Ser: 0.61 mg/dL (ref 0.44–1.00)
Glucose, Bld: 86 mg/dL (ref 65–99)
POTASSIUM: 4.7 mmol/L (ref 3.5–5.1)
SODIUM: 136 mmol/L (ref 135–145)

## 2015-12-26 LAB — SURGICAL PCR SCREEN
MRSA, PCR: NEGATIVE
STAPHYLOCOCCUS AUREUS: NEGATIVE

## 2015-12-26 LAB — ABO/RH: ABO/RH(D): O POS

## 2015-12-26 NOTE — Progress Notes (Signed)
Clearance note per chart per Dr Alain Marion

## 2015-12-26 NOTE — Patient Instructions (Signed)
Brittany [name]  12/26/2015   Your procedure is scheduled on: Tuesday December 31, 2015  Report to [facility] Main  Entrance take [place]  elevators to 3rd floor to  [place] at 8:30 AM.  Call this number if you have problems the morning of surgery [phone]   Remember: ONLY 1 PERSON MAY GO WITH YOU TO SHORT STAY TO GET  READY MORNING OF [place].  Do not eat food or drink liquids :After Midnight.     Take these medicines the morning of surgery with A SIP OF WATER: Citalopram (Celexa); Ranitidine (Zantac); May use eye drops                                You may not have any metal on your body including hair pins and              piercings  Do not wear jewelry, make-up, lotions, powders or perfumes, deodorant             Do not wear nail polish.  Do not shave  48 hours prior to surgery.             Do not bring valuables to the hospital. [place].  Contacts, dentures or bridgework may not be worn into surgery.  Leave suitcase in the car. After surgery it may be brought to your room.              Please read over the following fact sheets you were given:INCENTIVE SPIROMETER; BLOOD TRANSFUSION INFORMATION SHEET  _____________________________________________________________________             [facility] - Preparing for Surgery Before surgery, you can play an important role.  Because skin is not sterile, your skin needs to be as free of germs as possible.  You can reduce the number of germs on your skin by washing with CHG (chlorahexidine gluconate) soap before surgery.  CHG is an antiseptic cleaner which kills germs and bonds with the skin to continue killing germs even after washing. Please DO NOT use if you have an allergy to CHG or antibacterial soaps.  If your skin becomes reddened/irritated stop using the CHG and inform your nurse when you arrive at Short Stay. Do not shave (including legs and  underarms) for at least 48 hours prior to the first CHG shower.  You may shave your face/neck. Please follow these instructions carefully:  1.  Shower with CHG Soap the night before surgery and the  morning of Surgery.  2.  If you choose to wash your hair, wash your hair first as usual with your  normal  shampoo.  3.  After you shampoo, rinse your hair and body thoroughly to remove the  shampoo.                           4.  Use CHG as you would any other liquid soap.  You can apply chg directly  to the skin and wash                       Gently with a scrungie or clean washcloth.  5.  Apply  the CHG Soap to your body ONLY FROM THE NECK DOWN.   Do not use on face/ open                           Wound or open sores. Avoid contact with eyes, ears mouth and genitals (private parts).                       Wash face,  Genitals (private parts) with your normal soap.             6.  Wash thoroughly, paying special attention to the area where your surgery  will be performed.  7.  Thoroughly rinse your body with warm water from the neck down.  8.  DO NOT shower/wash with your normal soap after using and rinsing off  the CHG Soap.                9.  Pat yourself dry with a clean towel.            10.  Wear clean pajamas.            11.  Place clean sheets on your bed the night of your first shower and do not  sleep with pets. Day of Surgery : Do not apply any lotions/deodorants the morning of surgery.  Please wear clean clothes to the hospital/surgery center.  FAILURE TO FOLLOW THESE INSTRUCTIONS MAY RESULT IN THE CANCELLATION OF YOUR SURGERY PATIENT SIGNATURE_________________________________  NURSE SIGNATURE__________________________________  ________________________________________________________________________   Adam Phenix  An incentive spirometer is a tool that can help keep your lungs clear and active. This tool measures how well you are filling your lungs with each breath. Taking  long deep breaths may help reverse or decrease the chance of developing breathing (pulmonary) problems (especially infection) following:  A long period of time when you are unable to move or be active. BEFORE THE PROCEDURE   If the spirometer includes an indicator to show your best effort, your nurse or respiratory therapist will set it to a desired goal.  If possible, sit up straight or lean slightly forward. Try not to slouch.  Hold the incentive spirometer in an upright position. INSTRUCTIONS FOR USE  1. Sit on the edge of your bed if possible, or sit up as far as you can in bed or on a chair. 2. Hold the incentive spirometer in an upright position. 3. Breathe out normally. 4. Place the mouthpiece in your mouth and seal your lips tightly around it. 5. Breathe in slowly and as deeply as possible, raising the piston or the ball toward the top of the column. 6. Hold your breath for 3-5 seconds or for as long as possible. Allow the piston or ball to fall to the bottom of the column. 7. Remove the mouthpiece from your mouth and breathe out normally. 8. Rest for a few seconds and repeat Steps 1 through 7 at least 10 times every 1-2 hours when you are awake. Take your time and take a few normal breaths between deep breaths. 9. The spirometer may include an indicator to show your best effort. Use the indicator as a goal to work toward during each repetition. 10. After each set of 10 deep breaths, practice coughing to be sure your lungs are clear. If you have an incision (the cut made at the time of surgery), support your incision when coughing by placing a pillow or rolled  up towels firmly against it. Once you are able to get out of bed, walk around indoors and cough well. You may stop using the incentive spirometer when instructed by your caregiver.  RISKS AND COMPLICATIONS  Take your time so you do not get dizzy or light-headed.  If you are in pain, you may need to take or ask for pain  medication before doing incentive spirometry. It is harder to take a deep breath if you are having pain. AFTER USE  Rest and breathe slowly and easily.  It can be helpful to keep track of a log of your progress. Your caregiver can provide you with a simple table to help with this. If you are using the spirometer at home, follow these instructions: [place] IF:   You are having difficultly using the spirometer.  You have trouble using the spirometer as often as instructed.  Your pain medication is not giving enough relief while using the spirometer.  You develop fever of 100.5 F (38.1 C) or higher. SEEK IMMEDIATE MEDICAL CARE IF:   You cough up bloody sputum that had not been present before.  You develop fever of 102 F (38.9 C) or greater.  You develop worsening pain at or near the incision site. MAKE SURE YOU:   Understand these instructions.  Will watch your condition.  Will get help right away if you are not doing well or get worse. Document Released: 06/29/2006 Document Revised: 05/11/2011 Document Reviewed: 08/30/2006 ExitCare Patient Information 2014 ExitCare, [place].   ________________________________________________________________________  WHAT IS A BLOOD TRANSFUSION? Blood Transfusion Information  A transfusion is the replacement of blood or some of its parts. Blood is made up of multiple cells which provide different functions.  Red blood cells carry oxygen and are used for blood loss replacement.  White blood cells fight against infection.  Platelets control bleeding.  Plasma helps clot blood.  Other blood products are available for specialized needs, such as hemophilia or other clotting disorders. BEFORE THE TRANSFUSION  Who gives blood for transfusions?   Healthy volunteers who are fully evaluated to make sure their blood is safe. This is blood bank blood. Transfusion therapy is the safest it has ever been in the practice of medicine.  Before blood is taken from a donor, a complete history is taken to make sure that person has no history of diseases nor engages in risky social behavior (examples are intravenous drug use or sexual activity with multiple partners). The donor's travel history is screened to minimize risk of transmitting infections, such as malaria. The donated blood is tested for signs of infectious diseases, such as HIV and hepatitis. The blood is then tested to be sure it is compatible with you in order to minimize the chance of a transfusion reaction. If you or a relative donates blood, this is often done in anticipation of surgery and is not appropriate for emergency situations. It takes many days to process the donated blood. RISKS AND COMPLICATIONS Although transfusion therapy is very safe and saves many lives, the main dangers of transfusion include:   Getting an infectious disease.  Developing a transfusion reaction. This is an allergic reaction to something in the blood you were given. Every precaution is taken to prevent this. The decision to have a blood transfusion has been considered carefully by your caregiver before blood is given. Blood is not given unless the benefits outweigh the risks. AFTER THE TRANSFUSION  Right after receiving a blood transfusion, you will usually feel  much better and more energetic. This is especially true if your red blood cells have gotten low (anemic). The transfusion raises the level of the red blood cells which carry oxygen, and this usually causes an energy increase.  The nurse administering the transfusion will monitor you carefully for complications. HOME CARE INSTRUCTIONS  No special instructions are needed after a transfusion. You may find your energy is better. Speak with your caregiver about any limitations on activity for underlying diseases you may have. SEEK MEDICAL CARE IF:   Your condition is not improving after your transfusion.  You develop redness or  irritation at the intravenous (IV) site. SEEK IMMEDIATE MEDICAL CARE IF:  Any of the following symptoms occur over the next 12 hours:  Shaking chills.  You have a temperature by mouth above 102 F (38.9 C), not controlled by medicine.  Chest, back, or muscle pain.  People around you feel you are not acting correctly or are confused.  Shortness of breath or difficulty breathing.  Dizziness and fainting.  You get a rash or develop hives.  You have a decrease in urine output.  Your urine turns a dark color or changes to pink, red, or brown. Any of the following symptoms occur over the next 10 days:  You have a temperature by mouth above 102 F (38.9 C), not controlled by medicine.  Shortness of breath.  Weakness after normal activity.  The white part of the eye turns yellow (jaundice).  You have a decrease in the amount of urine or are urinating less often.  Your urine turns a dark color or changes to pink, red, or brown. Document Released: 02/14/2000 Document Revised: 05/11/2011 Document Reviewed: 10/03/2007 [facility] Patient Information 2014 [place], [place].  _______________________________________________________________________

## 2015-12-31 ENCOUNTER — Inpatient Hospital Stay (HOSPITAL_COMMUNITY): Payer: PPO | Admitting: Certified Registered Nurse Anesthetist

## 2015-12-31 ENCOUNTER — Inpatient Hospital Stay (HOSPITAL_COMMUNITY)
Admission: RE | Admit: 2015-12-31 | Discharge: 2016-01-01 | DRG: 470 | Disposition: A | Discharge status: Home Health | Payer: PPO | Source: Ambulatory Visit | Attending: Orthopedic Surgery | Admitting: Orthopedic Surgery

## 2015-12-31 ENCOUNTER — Encounter (HOSPITAL_COMMUNITY): Payer: Self-pay | Admitting: *Deleted

## 2015-12-31 ENCOUNTER — Encounter (HOSPITAL_COMMUNITY)
Admission: RE | Disposition: A | Discharge status: Home Health | Payer: Self-pay | Source: Ambulatory Visit | Attending: Orthopedic Surgery

## 2015-12-31 DIAGNOSIS — M659 Synovitis and tenosynovitis, unspecified: Secondary | ICD-10-CM | POA: Diagnosis present

## 2015-12-31 DIAGNOSIS — E785 Hyperlipidemia, unspecified: Secondary | ICD-10-CM | POA: Diagnosis present

## 2015-12-31 DIAGNOSIS — M810 Age-related osteoporosis without current pathological fracture: Secondary | ICD-10-CM | POA: Diagnosis present

## 2015-12-31 DIAGNOSIS — F411 Generalized anxiety disorder: Secondary | ICD-10-CM | POA: Diagnosis not present

## 2015-12-31 DIAGNOSIS — Z801 Family history of malignant neoplasm of trachea, bronchus and lung: Secondary | ICD-10-CM | POA: Diagnosis not present

## 2015-12-31 DIAGNOSIS — Z9221 Personal history of antineoplastic chemotherapy: Secondary | ICD-10-CM

## 2015-12-31 DIAGNOSIS — Z8249 Family history of ischemic heart disease and other diseases of the circulatory system: Secondary | ICD-10-CM | POA: Diagnosis not present

## 2015-12-31 DIAGNOSIS — Z888 Allergy status to other drugs, medicaments and biological substances status: Secondary | ICD-10-CM | POA: Diagnosis not present

## 2015-12-31 DIAGNOSIS — M25461 Effusion, right knee: Secondary | ICD-10-CM | POA: Diagnosis not present

## 2015-12-31 DIAGNOSIS — M25761 Osteophyte, right knee: Secondary | ICD-10-CM | POA: Diagnosis not present

## 2015-12-31 DIAGNOSIS — I340 Nonrheumatic mitral (valve) insufficiency: Secondary | ICD-10-CM | POA: Diagnosis not present

## 2015-12-31 DIAGNOSIS — I341 Nonrheumatic mitral (valve) prolapse: Secondary | ICD-10-CM | POA: Diagnosis present

## 2015-12-31 DIAGNOSIS — Z803 Family history of malignant neoplasm of breast: Secondary | ICD-10-CM

## 2015-12-31 DIAGNOSIS — F329 Major depressive disorder, single episode, unspecified: Secondary | ICD-10-CM | POA: Diagnosis not present

## 2015-12-31 DIAGNOSIS — Z87891 Personal history of nicotine dependence: Secondary | ICD-10-CM | POA: Diagnosis not present

## 2015-12-31 DIAGNOSIS — M25561 Pain in right knee: Secondary | ICD-10-CM | POA: Diagnosis not present

## 2015-12-31 DIAGNOSIS — Z96659 Presence of unspecified artificial knee joint: Secondary | ICD-10-CM

## 2015-12-31 DIAGNOSIS — K219 Gastro-esophageal reflux disease without esophagitis: Secondary | ICD-10-CM | POA: Diagnosis not present

## 2015-12-31 DIAGNOSIS — Z923 Personal history of irradiation: Secondary | ICD-10-CM | POA: Diagnosis not present

## 2015-12-31 DIAGNOSIS — M1711 Unilateral primary osteoarthritis, right knee: Principal | ICD-10-CM | POA: Diagnosis present

## 2015-12-31 DIAGNOSIS — Z820 Family history of epilepsy and other diseases of the nervous system: Secondary | ICD-10-CM | POA: Diagnosis not present

## 2015-12-31 DIAGNOSIS — Z853 Personal history of malignant neoplasm of breast: Secondary | ICD-10-CM | POA: Diagnosis not present

## 2015-12-31 HISTORY — PX: JOINT REPLACEMENT: SHX530

## 2015-12-31 HISTORY — PX: TOTAL KNEE ARTHROPLASTY: SHX125

## 2015-12-31 LAB — TYPE AND SCREEN
ABO/RH(D): O POS
ANTIBODY SCREEN: NEGATIVE

## 2015-12-31 SURGERY — ARTHROPLASTY, KNEE, TOTAL
Anesthesia: Spinal | Site: Knee | Laterality: Right

## 2015-12-31 MED ORDER — KETOROLAC TROMETHAMINE 30 MG/ML IJ SOLN
INTRAMUSCULAR | Status: DC | PRN
Start: 2015-12-31 — End: 2015-12-31
  Administered 2015-12-31: 30 mg via INTRAVENOUS

## 2015-12-31 MED ORDER — SODIUM CHLORIDE 0.9 % IR SOLN
Status: DC | PRN
  Administered 2015-12-31: 1000 mL

## 2015-12-31 MED ORDER — FENTANYL CITRATE (PF) 100 MCG/2ML IJ SOLN
INTRAMUSCULAR | Status: AC
  Filled 2015-12-31: qty 2

## 2015-12-31 MED ORDER — ONDANSETRON HCL 4 MG/2ML IJ SOLN
INTRAMUSCULAR | Status: AC
  Filled 2015-12-31: qty 2

## 2015-12-31 MED ORDER — SODIUM CHLORIDE 0.9 % IV SOLN
INTRAVENOUS | Status: DC
  Administered 2015-12-31 – 2016-01-01 (×2): via INTRAVENOUS

## 2015-12-31 MED ORDER — DEXAMETHASONE SODIUM PHOSPHATE 10 MG/ML IJ SOLN
INTRAMUSCULAR | Status: AC
  Filled 2015-12-31: qty 1

## 2015-12-31 MED ORDER — BUPIVACAINE HCL (PF) 0.25 % IJ SOLN
INTRAMUSCULAR | Status: DC | PRN
  Administered 2015-12-31: 30 mL

## 2015-12-31 MED ORDER — SODIUM CHLORIDE 0.9 % IJ SOLN
INTRAMUSCULAR | Status: AC
Start: 2015-12-31 — End: 2015-12-31
  Filled 2015-12-31: qty 50

## 2015-12-31 MED ORDER — METOCLOPRAMIDE HCL 5 MG PO TABS: 5.0000 mg | ORAL_TABLET | Freq: Three times a day (TID) | ORAL | Status: DC | PRN

## 2015-12-31 MED ORDER — LACTATED RINGERS IV SOLN
INTRAVENOUS | Status: DC
Start: 2015-12-31 — End: 2015-12-31
  Administered 2015-12-31: 1000 mL via INTRAVENOUS
  Administered 2015-12-31: 12:00:00 via INTRAVENOUS

## 2015-12-31 MED ORDER — BUPIVACAINE HCL (PF) 0.25 % IJ SOLN
INTRAMUSCULAR | Status: AC
  Filled 2015-12-31: qty 30

## 2015-12-31 MED ORDER — CELECOXIB 200 MG PO CAPS
200.0000 mg | ORAL_CAPSULE | Freq: Two times a day (BID) | ORAL | Status: DC
  Administered 2015-12-31 – 2016-01-01 (×2): 200 mg via ORAL
  Filled 2015-12-31 (×2): qty 1

## 2015-12-31 MED ORDER — ONDANSETRON HCL 4 MG/2ML IJ SOLN
INTRAMUSCULAR | Status: DC | PRN
  Administered 2015-12-31: 4 mg via INTRAVENOUS

## 2015-12-31 MED ORDER — CITALOPRAM HYDROBROMIDE 20 MG PO TABS
40.0000 mg | ORAL_TABLET | Freq: Every day | ORAL | Status: DC
  Administered 2016-01-01: 40 mg via ORAL
  Filled 2015-12-31: qty 2

## 2015-12-31 MED ORDER — DEXAMETHASONE SODIUM PHOSPHATE 10 MG/ML IJ SOLN
INTRAMUSCULAR | Status: DC | PRN
  Administered 2015-12-31: 10 mg via INTRAVENOUS

## 2015-12-31 MED ORDER — PHENOL 1.4 % MT LIQD
1.0000 | OROMUCOSAL | Status: DC | PRN
  Filled 2015-12-31: qty 177

## 2015-12-31 MED ORDER — STERILE WATER FOR IRRIGATION IR SOLN
Status: DC | PRN
  Administered 2015-12-31: 2000 mL

## 2015-12-31 MED ORDER — METOCLOPRAMIDE HCL 5 MG/ML IJ SOLN: 5.0000 mg | Freq: Three times a day (TID) | INTRAMUSCULAR | Status: DC | PRN

## 2015-12-31 MED ORDER — PRAVASTATIN SODIUM 20 MG PO TABS
20.0000 mg | ORAL_TABLET | Freq: Every day | ORAL | Status: DC
  Administered 2015-12-31: 20 mg via ORAL
  Filled 2015-12-31: qty 1

## 2015-12-31 MED ORDER — BISACODYL 10 MG RE SUPP: 10.0000 mg | Freq: Every day | RECTAL | Status: DC | PRN

## 2015-12-31 MED ORDER — CHLORHEXIDINE GLUCONATE 4 % EX LIQD: 60.0000 mL | Freq: Once | CUTANEOUS | Status: DC

## 2015-12-31 MED ORDER — PROPOFOL 500 MG/50ML IV EMUL
INTRAVENOUS | Status: DC | PRN
  Administered 2015-12-31: 75 ug/kg/min via INTRAVENOUS

## 2015-12-31 MED ORDER — MENTHOL 3 MG MT LOZG: 1.0000 | LOZENGE | OROMUCOSAL | Status: DC | PRN

## 2015-12-31 MED ORDER — BUPIVACAINE IN DEXTROSE 0.75-8.25 % IT SOLN
INTRATHECAL | Status: DC | PRN
  Administered 2015-12-31: 1.8 mL via INTRATHECAL

## 2015-12-31 MED ORDER — DIPHENHYDRAMINE HCL 25 MG PO CAPS: 25.0000 mg | ORAL_CAPSULE | Freq: Four times a day (QID) | ORAL | Status: DC | PRN

## 2015-12-31 MED ORDER — PROPOFOL 10 MG/ML IV BOLUS
INTRAVENOUS | Status: DC | PRN
  Administered 2015-12-31: 10 mg via INTRAVENOUS

## 2015-12-31 MED ORDER — DEXTROSE 5 % IV SOLN
500.0000 mg | Freq: Four times a day (QID) | INTRAVENOUS | Status: DC | PRN
  Filled 2015-12-31: qty 5

## 2015-12-31 MED ORDER — PROPOFOL 10 MG/ML IV BOLUS
INTRAVENOUS | Status: AC
  Filled 2015-12-31: qty 40

## 2015-12-31 MED ORDER — ASPIRIN 81 MG PO CHEW
81.0000 mg | CHEWABLE_TABLET | Freq: Two times a day (BID) | ORAL | Status: DC
  Administered 2015-12-31 – 2016-01-01 (×2): 81 mg via ORAL
  Filled 2015-12-31 (×3): qty 1

## 2015-12-31 MED ORDER — ZOLPIDEM TARTRATE 5 MG PO TABS
5.0000 mg | ORAL_TABLET | Freq: Every evening | ORAL | Status: DC | PRN
  Administered 2015-12-31: 5 mg via ORAL
  Filled 2015-12-31: qty 1

## 2015-12-31 MED ORDER — ALUM & MAG HYDROXIDE-SIMETH 200-200-20 MG/5ML PO SUSP: 30.0000 mL | ORAL | Status: DC | PRN

## 2015-12-31 MED ORDER — SODIUM CHLORIDE 0.9 % IJ SOLN
INTRAMUSCULAR | Status: DC | PRN
  Administered 2015-12-31: 29 mL

## 2015-12-31 MED ORDER — PROPOFOL 10 MG/ML IV BOLUS
INTRAVENOUS | Status: AC
  Filled 2015-12-31: qty 20

## 2015-12-31 MED ORDER — KETOROLAC TROMETHAMINE 30 MG/ML IJ SOLN
INTRAMUSCULAR | Status: AC
  Filled 2015-12-31: qty 1

## 2015-12-31 MED ORDER — HYDROMORPHONE HCL 1 MG/ML IJ SOLN: 0.5000 mg | INTRAMUSCULAR | Status: DC | PRN

## 2015-12-31 MED ORDER — FENTANYL CITRATE (PF) 100 MCG/2ML IJ SOLN
INTRAMUSCULAR | Status: DC | PRN
  Administered 2015-12-31 (×2): 50 ug via INTRAVENOUS
  Administered 2015-12-31: 25 ug via INTRAVENOUS

## 2015-12-31 MED ORDER — KETOROLAC TROMETHAMINE 0.4 % OP SOLN: 1.0000 [drp] | Freq: Four times a day (QID) | OPHTHALMIC | Status: DC

## 2015-12-31 MED ORDER — DEXAMETHASONE SODIUM PHOSPHATE 10 MG/ML IJ SOLN
10.0000 mg | Freq: Once | INTRAMUSCULAR | Status: AC
  Administered 2016-01-01: 10 mg via INTRAVENOUS
  Filled 2015-12-31: qty 1

## 2015-12-31 MED ORDER — DOCUSATE SODIUM 100 MG PO CAPS
100.0000 mg | ORAL_CAPSULE | Freq: Two times a day (BID) | ORAL | Status: DC
  Administered 2015-12-31 – 2016-01-01 (×2): 100 mg via ORAL
  Filled 2015-12-31 (×2): qty 1

## 2015-12-31 MED ORDER — DEXAMETHASONE SODIUM PHOSPHATE 10 MG/ML IJ SOLN: 10.0000 mg | Freq: Once | INTRAMUSCULAR | Status: DC

## 2015-12-31 MED ORDER — ONDANSETRON HCL 4 MG PO TABS
4.0000 mg | ORAL_TABLET | Freq: Four times a day (QID) | ORAL | Status: DC | PRN
  Administered 2016-01-01: 4 mg via ORAL
  Filled 2015-12-31: qty 1

## 2015-12-31 MED ORDER — SUMATRIPTAN SUCCINATE 100 MG PO TABS
100.0000 mg | ORAL_TABLET | ORAL | Status: DC | PRN
  Filled 2015-12-31: qty 1

## 2015-12-31 MED ORDER — TRANEXAMIC ACID 1000 MG/10ML IV SOLN
1000.0000 mg | INTRAVENOUS | Status: AC
  Administered 2015-12-31: 1000 mg via INTRAVENOUS
  Filled 2015-12-31: qty 1100

## 2015-12-31 MED ORDER — MIDAZOLAM HCL 2 MG/2ML IJ SOLN
INTRAMUSCULAR | Status: AC
  Filled 2015-12-31: qty 2

## 2015-12-31 MED ORDER — ONDANSETRON HCL 4 MG/2ML IJ SOLN
4.0000 mg | Freq: Four times a day (QID) | INTRAMUSCULAR | Status: DC | PRN
  Administered 2015-12-31 – 2016-01-01 (×2): 4 mg via INTRAVENOUS
  Filled 2015-12-31: qty 2

## 2015-12-31 MED ORDER — TRAZODONE HCL 50 MG PO TABS
25.0000 mg | ORAL_TABLET | Freq: Every evening | ORAL | Status: DC | PRN
  Administered 2016-01-01: 50 mg via ORAL
  Filled 2015-12-31: qty 1

## 2015-12-31 MED ORDER — PHENYLEPHRINE 40 MCG/ML (10ML) SYRINGE FOR IV PUSH (FOR BLOOD PRESSURE SUPPORT)
PREFILLED_SYRINGE | INTRAVENOUS | Status: DC | PRN
  Administered 2015-12-31: 40 ug via INTRAVENOUS
  Administered 2015-12-31 (×4): 80 ug via INTRAVENOUS

## 2015-12-31 MED ORDER — POLYETHYLENE GLYCOL 3350 17 G PO PACK
17.0000 g | PACK | Freq: Two times a day (BID) | ORAL | Status: DC
  Administered 2015-12-31: 17 g via ORAL
  Filled 2015-12-31 (×2): qty 1

## 2015-12-31 MED ORDER — OFLOXACIN 0.3 % OP SOLN
1.0000 [drp] | Freq: Four times a day (QID) | OPHTHALMIC | Status: DC
  Administered 2015-12-31 – 2016-01-01 (×4): 1 [drp] via OPHTHALMIC
  Filled 2015-12-31: qty 5

## 2015-12-31 MED ORDER — MAGNESIUM CITRATE PO SOLN: 1.0000 | Freq: Once | ORAL | Status: DC | PRN

## 2015-12-31 MED ORDER — CEFAZOLIN SODIUM-DEXTROSE 2-4 GM/100ML-% IV SOLN
2.0000 g | Freq: Four times a day (QID) | INTRAVENOUS | Status: AC
  Administered 2015-12-31 (×2): 2 g via INTRAVENOUS
  Filled 2015-12-31 (×2): qty 100

## 2015-12-31 MED ORDER — KETOROLAC TROMETHAMINE 0.5 % OP SOLN
1.0000 [drp] | Freq: Four times a day (QID) | OPHTHALMIC | Status: DC
  Administered 2015-12-31 – 2016-01-01 (×4): 1 [drp] via OPHTHALMIC
  Filled 2015-12-31: qty 3

## 2015-12-31 MED ORDER — CEFAZOLIN SODIUM-DEXTROSE 2-4 GM/100ML-% IV SOLN
INTRAVENOUS | Status: AC
  Filled 2015-12-31: qty 100

## 2015-12-31 MED ORDER — HYDROCODONE-ACETAMINOPHEN 7.5-325 MG PO TABS
1.0000 | ORAL_TABLET | ORAL | Status: DC
  Administered 2015-12-31 – 2016-01-01 (×6): 2 via ORAL
  Filled 2015-12-31 (×6): qty 2

## 2015-12-31 MED ORDER — EPHEDRINE SULFATE-NACL 50-0.9 MG/10ML-% IV SOSY
PREFILLED_SYRINGE | INTRAVENOUS | Status: DC | PRN
  Administered 2015-12-31: 10 mg via INTRAVENOUS

## 2015-12-31 MED ORDER — 0.9 % SODIUM CHLORIDE (POUR BTL) OPTIME
TOPICAL | Status: DC | PRN
  Administered 2015-12-31: 1000 mL

## 2015-12-31 MED ORDER — MIDAZOLAM HCL 5 MG/5ML IJ SOLN
INTRAMUSCULAR | Status: DC | PRN
  Administered 2015-12-31: 2 mg via INTRAVENOUS

## 2015-12-31 MED ORDER — FAMOTIDINE 20 MG PO TABS
20.0000 mg | ORAL_TABLET | Freq: Two times a day (BID) | ORAL | Status: DC
  Administered 2015-12-31 – 2016-01-01 (×2): 20 mg via ORAL
  Filled 2015-12-31 (×2): qty 1

## 2015-12-31 MED ORDER — CEFAZOLIN SODIUM-DEXTROSE 2-4 GM/100ML-% IV SOLN
2.0000 g | INTRAVENOUS | Status: AC
  Administered 2015-12-31: 2 g via INTRAVENOUS
  Filled 2015-12-31: qty 100

## 2015-12-31 MED ORDER — PREDNISOLONE ACETATE 1 % OP SUSP
1.0000 [drp] | Freq: Four times a day (QID) | OPHTHALMIC | Status: DC
  Administered 2015-12-31 – 2016-01-01 (×4): 1 [drp] via OPHTHALMIC
  Filled 2015-12-31: qty 1

## 2015-12-31 MED ORDER — METHOCARBAMOL 500 MG PO TABS
500.0000 mg | ORAL_TABLET | Freq: Four times a day (QID) | ORAL | Status: DC | PRN
  Administered 2016-01-01: 500 mg via ORAL
  Filled 2015-12-31: qty 1

## 2015-12-31 MED ORDER — FERROUS SULFATE 325 (65 FE) MG PO TABS
325.0000 mg | ORAL_TABLET | Freq: Three times a day (TID) | ORAL | Status: DC
  Administered 2015-12-31: 325 mg via ORAL
  Filled 2015-12-31 (×2): qty 1

## 2015-12-31 SURGICAL SUPPLY — 51 items
ADH SKN CLS APL DERMABOND .7 (GAUZE/BANDAGES/DRESSINGS) ×1
BAG SPEC THK2 15X12 ZIP CLS (MISCELLANEOUS) ×1
BAG ZIPLOCK 12X15 (MISCELLANEOUS) ×2 IMPLANT
BANDAGE ACE 6X5 VEL STRL LF (GAUZE/BANDAGES/DRESSINGS) ×3 IMPLANT
BLADE SAW SGTL 13.0X1.19X90.0M (BLADE) ×3 IMPLANT
BOWL SMART MIX CTS (DISPOSABLE) ×3 IMPLANT
CAPT KNEE TOTAL 3 ATTUNE ×2 IMPLANT
CEMENT HV SMART SET (Cement) ×4 IMPLANT
CLOTH BEACON ORANGE TIMEOUT ST (SAFETY) ×3 IMPLANT
CUFF TOURN SGL QUICK 34 (TOURNIQUET CUFF) ×3
CUFF TRNQT CYL 34X4X40X1 (TOURNIQUET CUFF) ×1 IMPLANT
DECANTER SPIKE VIAL GLASS SM (MISCELLANEOUS) ×3 IMPLANT
DERMABOND ADVANCED (GAUZE/BANDAGES/DRESSINGS) ×2
DERMABOND ADVANCED .7 DNX12 (GAUZE/BANDAGES/DRESSINGS) ×1 IMPLANT
DRAPE U-SHAPE 47X51 STRL (DRAPES) ×3 IMPLANT
DRESSING AQUACEL AG SP 3.5X10 (GAUZE/BANDAGES/DRESSINGS) ×1 IMPLANT
DRSG AQUACEL AG SP 3.5X10 (GAUZE/BANDAGES/DRESSINGS) ×3
DURAPREP 26ML APPLICATOR (WOUND CARE) ×6 IMPLANT
ELECT REM PT RETURN 9FT ADLT (ELECTROSURGICAL) ×3
ELECTRODE REM PT RTRN 9FT ADLT (ELECTROSURGICAL) ×1 IMPLANT
GLOVE BIO SURGEON STRL SZ 6.5 (GLOVE) ×1 IMPLANT
GLOVE BIO SURGEONS STRL SZ 6.5 (GLOVE) ×1
GLOVE BIOGEL M 7.0 STRL (GLOVE) ×4 IMPLANT
GLOVE BIOGEL PI IND STRL 6.5 (GLOVE) IMPLANT
GLOVE BIOGEL PI IND STRL 7.0 (GLOVE) IMPLANT
GLOVE BIOGEL PI IND STRL 7.5 (GLOVE) ×1 IMPLANT
GLOVE BIOGEL PI IND STRL 8.5 (GLOVE) ×1 IMPLANT
GLOVE BIOGEL PI INDICATOR 6.5 (GLOVE) ×2
GLOVE BIOGEL PI INDICATOR 7.0 (GLOVE) ×2
GLOVE BIOGEL PI INDICATOR 7.5 (GLOVE) ×10
GLOVE BIOGEL PI INDICATOR 8.5 (GLOVE) ×2
GLOVE ORTHO TXT STRL SZ7.5 (GLOVE) ×6 IMPLANT
GLOVE SURG SS PI 7.0 STRL IVOR (GLOVE) ×2 IMPLANT
GLOVE SURG SS PI 7.5 STRL IVOR (GLOVE) ×2 IMPLANT
GOWN STRL REUS W/TWL LRG LVL3 (GOWN DISPOSABLE) ×5 IMPLANT
GOWN STRL REUS W/TWL XL LVL3 (GOWN DISPOSABLE) ×7 IMPLANT
HANDPIECE INTERPULSE COAX TIP (DISPOSABLE) ×3
MANIFOLD NEPTUNE II (INSTRUMENTS) ×3 IMPLANT
PACK TOTAL KNEE CUSTOM (KITS) ×3 IMPLANT
POSITIONER SURGICAL ARM (MISCELLANEOUS) ×3 IMPLANT
SET HNDPC FAN SPRY TIP SCT (DISPOSABLE) ×1 IMPLANT
SET PAD KNEE POSITIONER (MISCELLANEOUS) ×3 IMPLANT
SUT MNCRL AB 4-0 PS2 18 (SUTURE) ×3 IMPLANT
SUT VIC AB 1 CT1 36 (SUTURE) ×3 IMPLANT
SUT VIC AB 2-0 CT1 27 (SUTURE) ×9
SUT VIC AB 2-0 CT1 TAPERPNT 27 (SUTURE) ×3 IMPLANT
SUT VLOC 180 0 24IN GS25 (SUTURE) ×3 IMPLANT
SYR 50ML LL SCALE MARK (SYRINGE) ×3 IMPLANT
TRAY FOLEY CATH SILVER 14FR (SET/KITS/TRAYS/PACK) ×2 IMPLANT
WRAP KNEE MAXI GEL POST OP (GAUZE/BANDAGES/DRESSINGS) ×3 IMPLANT
YANKAUER SUCT BULB TIP 10FT TU (MISCELLANEOUS) ×3 IMPLANT

## 2015-12-31 NOTE — Anesthesia Preprocedure Evaluation (Addendum)
Anesthesia Evaluation  Patient identified by MRN, date of birth, ID band Patient awake  General Assessment Comment:Patient Active Problem List  Diagnosis Date Noted . Baker's cyst, unruptured 09/27/2015 . Degenerative arthritis of right knee 09/27/2015 . Right knee pain 07/31/2015 . Insomnia 07/31/2015 . Onychomycosis 09/25/2014 . Cognitive deficits 09/25/2014 . Urinary frequency 05/21/2014 . Mitral valve prolapse 05/08/2014 . Mitral regurgitation 05/08/2014 . Breast cancer, right breast ([place]) 03/12/2014 . Burning with urination 03/06/2014 . Acute cystitis with positive culture 03/06/2014 . Chest pain at rest 02/09/2014 . Dyslipidemia 02/09/2014 . Myalgia 07/18/2013 . History of radiation therapy  . Hiatal hernia  . History of chemotherapy  . Osteoporosis 03/21/2012 . Seasonal and perennial allergic rhinitis 12/17/2011 . Well adult exam 11/11/2010 . B12 deficiency 10/09/2009 . TOBACCO USE, QUIT 01/31/2009 . THYROID NODULE 12/25/2008 . ESOPHAGEAL STRICTURE 11/23/2007 . GERD 11/23/2007 . VARICOSE VEIN 12/29/2006 . Anxiety state 12/23/2006 . Depression 12/23/2006 . 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 Disorder of bone and cartilage     Reviewed: Allergy & Precautions, NPO status , Patient's Chart, lab work & pertinent test results  Airway Mallampati: II  TM Distance: >3 FB Neck ROM: Full    Dental no notable dental hx.    Pulmonary neg pulmonary ROS, former smoker,    Pulmonary exam normal breath sounds clear to auscultation       Cardiovascular negative cardio ROS Normal cardiovascular exam Rhythm:Regular Rate:Normal     Neuro/Psych  Headaches, PSYCHIATRIC DISORDERS Anxiety Depression  Neuromuscular disease    GI/Hepatic Neg liver ROS, hiatal hernia, GERD  ,  Endo/Other  negative endocrine ROS  Renal/GU negative Renal ROS  negative genitourinary   Musculoskeletal  (+) Arthritis ,    Abdominal   Peds negative pediatric ROS (+)  Hematology negative hematology ROS (+)   Anesthesia Other Findings   Reproductive/Obstetrics negative OB ROS                            Anesthesia Physical Anesthesia Plan  ASA: II  Anesthesia Plan: Spinal   Post-op Pain Management:    Induction: Intravenous  Airway Management Planned:   Additional Equipment:   Intra-op Plan:   Post-operative Plan:   Informed Consent: I have reviewed the patients History and Physical, chart, labs and discussed the procedure including the risks, benefits and alternatives for the proposed anesthesia with the patient or authorized representative who has indicated his/her understanding and acceptance.   Dental advisory given  Plan Discussed with: CRNA  Anesthesia Plan Comments: (Discussed risks and benefits of and differences between spinal and general. Discussed risks of spinal including headache, backache, failure, bleeding and hematoma, infection, and nerve damage. Patient consents to spinal. Questions answered.  Platelet count acceptable.)       Anesthesia Quick Evaluation

## 2015-12-31 NOTE — Anesthesia Procedure Notes (Signed)
Procedure Name: MAC Date/Time: 12/31/2015 10:52 AM Performed by: West Pugh Pre-anesthesia Checklist: Patient identified, Timeout performed, Emergency Drugs available, Suction available and Patient being monitored Patient Re-evaluated:Patient Re-evaluated prior to inductionOxygen Delivery Method: Simple face mask Placement Confirmation: positive ETCO2 Dental Injury: Teeth and Oropharynx as per pre-operative assessment

## 2015-12-31 NOTE — Progress Notes (Signed)
PHARMACY NOTE: CITALOPRAM DOSING On citalopram 40mg  daily as taken prior to admission.  Recent FDA Alert recommends limiting citalopram dosage to 20mg  daily when age > 60 due to increased risk of QTc prolongation and life-threatening arrhythmias.   Preoperative EKG 12/26/15:  QTc 474 msec   Recommend: Consider risk vs. benefit of continuing present citalopram dosage.  Clayburn Pert, PharmD, BCPS Pager: [phone] 12/31/2015  3:23 PM

## 2015-12-31 NOTE — Transfer of Care (Signed)
Immediate Anesthesia Transfer of Care Note  Patient: Brittany [name]  Procedure(s) Performed: Procedure(s): RIGHT TOTAL KNEE ARTHROPLASTY (Right)  Patient Location: PACU  Anesthesia Type:MAC and Spinal  Level of Consciousness:  sedated, patient cooperative and responds to stimulation  Airway & Oxygen Therapy:Patient Spontanous Breathing and Patient connected to face mask oxgen  Post-op Assessment:  Report given to PACU RN and Post -op Vital signs reviewed and stable  Post vital signs:  Reviewed and stable  Last Vitals:  Vitals:   12/31/15 0904  BP: 115/60  Pulse: 76  Resp: 18  Temp: [id number] C    Complications: No apparent anesthesia complications

## 2015-12-31 NOTE — Anesthesia Postprocedure Evaluation (Signed)
Anesthesia Post Note  Patient: Brittany [name]  Procedure(s) Performed: Procedure(s) (LRB): RIGHT TOTAL KNEE ARTHROPLASTY (Right)  Patient location during evaluation: PACU Anesthesia Type: Spinal Level of consciousness: oriented and awake and alert Pain management: pain level controlled Vital Signs Assessment: post-procedure vital signs reviewed and stable Respiratory status: spontaneous breathing, respiratory function stable and patient connected to nasal cannula oxygen Cardiovascular status: blood pressure returned to baseline and stable Postop Assessment: no headache, no backache, spinal receding and patient able to bend at knees Anesthetic complications: no    Last Vitals:  Vitals:   12/31/15 1400 12/31/15 1419  BP: 118/64 (!) 103/47  Pulse: 65 66  Resp: 20 16  Temp: 36.6 C 36.8 C    Last Pain:  Vitals:   12/31/15 1345  TempSrc:   PainSc: 1                  Tandra Rosado J

## 2015-12-31 NOTE — Op Note (Signed)
NAME:  Brittany [name]                      MEDICAL RECORD NO.:  [id number]                             FACILITY:  [facility]      PHYSICIAN:  Pietro Cassis. Alvan Dame, M.D.  DATE OF BIRTH:  08/31/1945      DATE OF PROCEDURE:  12/31/2015                                     OPERATIVE REPORT         PREOPERATIVE DIAGNOSIS:  Right knee osteoarthritis.      POSTOPERATIVE DIAGNOSIS:  Right knee osteoarthritis.      FINDINGS:  The patient was noted to have complete loss of cartilage and   bone-on-bone arthritis with associated osteophytes in the lateral and patellofemoral compartments of   the knee with a significant synovitis and associated effusion.      PROCEDURE:  Right total knee replacement.      COMPONENTS USED:  DePuy Attune rotating platform posterior stabilized knee   system, a size 4 standard femur, 3 tibia, size 5 PS AOX insert, and 35 anatomic patellar   button.      SURGEON:  Pietro Cassis. Alvan Dame, M.D.      ASSISTANT:  Nehemiah Massed, PA-C.      ANESTHESIA:  Spinal.      SPECIMENS:  None.      COMPLICATION:  None.      DRAINS:  None.  EBL: <50cc      TOURNIQUET TIME:   Total Tourniquet Time Documented: Thigh (Right) - 32 minutes Total: Thigh (Right) - 32 minutes  .      The patient was stable to the recovery room.      INDICATION FOR PROCEDURE:  Brittany [name] is a 70 y.o. female patient of   mine.  The patient had been seen, evaluated, and treated conservatively in the   office with medication, activity modification, and injections.  The patient had   radiographic changes of bone-on-bone arthritis with endplate sclerosis and osteophytes noted.      The patient failed conservative measures including medication, injections, and activity modification, and at this point was ready for more definitive measures.   Based on the radiographic changes and failed conservative measures, the patient   decided to proceed with total knee replacement.  Risks of infection,   DVT, component  failure, need for revision surgery, postop course, and   expectations were all   discussed and reviewed.  Consent was obtained for benefit of pain   relief.      PROCEDURE IN DETAIL:  The patient was brought to the operative theater.   Once adequate anesthesia, preoperative antibiotics, 2 gm of Ancef, 1 gm of Tranexamic Acid, and 10 mg of Decadron administered, the patient was positioned supine with the right thigh tourniquet placed.  The  right lower extremity was prepped and draped in sterile fashion.  A time-   out was performed identifying the patient, planned procedure, and   extremity.      The right lower extremity was placed in the [facility] leg holder.  The leg was   exsanguinated, tourniquet elevated to 250 mmHg.  A midline incision was   made  followed by median parapatellar arthrotomy.  Following initial   exposure, attention was first directed to the patella.  Precut   measurement was noted to be 21 mm.  I resected down to 13-14 mm and used a   35 anatomic patellar button to restore patellar height as well as cover the cut   surface.      The lug holes were drilled and a metal shim was placed to protect the   patella from retractors and saw blades.      At this point, attention was now directed to the femur.  The femoral   canal was opened with a drill, irrigated to try to prevent fat emboli.  An   intramedullary rod was passed at 3 degrees valgus, 8 mm of bone was   resected off the distal femur due to pre-operative hyperextension.  Following this resection, the tibia was   subluxated anteriorly.  Using the extramedullary guide, 2 mm of bone was resected off   the proximal lateral tibia.  We confirmed the gap would be   stable medially and laterally with a size 5 spacer block as well as confirmed   the cut was perpendicular in the coronal plane, checking with an alignment rod.      Once this was done, I sized the femur to be a size 4 in the anterior-   posterior dimension,  chose a standard component based on medial and   lateral dimension.  The size 4 rotation block was then pinned in   position anterior referenced using the C-clamp to set rotation.  The   anterior, posterior, and  chamfer cuts were made without difficulty nor   notching making certain that I was along the anterior cortex to help   with flexion gap stability.      The final box cut was made off the lateral aspect of distal femur.      At this point, the tibia was sized to be a size 3, the size 3 tray was   then pinned in position through the medial third of the tubercle,   drilled, and keel punched.  Trial reduction was now carried with a 4 femur,  3 tibia, a size 5 PS insert, and the 35 anatomic patella botton.  The knee was brought to   extension, full extension with good flexion stability with the patella   tracking through the trochlea without application of pressure.  Given   all these findings the femoral lug holes were drilled and then the trial components removed.  Final components were   opened and cement was mixed.  The knee was irrigated with normal saline   solution and pulse lavage.  The synovial lining was   then injected with 30 cc of 0.25% Marcaine and 1 cc of Toradol plus 30 cc of NS for a    total of 61 cc.      The knee was irrigated.  Final implants were then cemented onto clean and   dried cut surfaces of bone with the knee brought to extension with a size 5 PS trial insert.      Once the cement had fully cured, the excess cement was removed   throughout the knee.  I confirmed I was satisfied with the range of   motion and stability, and the final size 5 PS AOX insert was chosen.  It was   placed into the knee.      The tourniquet had been let down  at 32 minutes.  No significant   hemostasis required.  The   extensor mechanism was then reapproximated using #1 Vicryl and #0 V-lock sutures with the knee   in flexion.  The   remaining wound was closed with 2-0 Vicryl  and running 4-0 Monocryl.   The knee was cleaned, dried, dressed sterilely using Dermabond and   Aquacel dressing.  The patient was then   brought to recovery room in stable condition, tolerating the procedure   well.   Please note that Physician Assistant, Nehemiah Massed, PA-C, was present for the entirety of the case, and was utilized for pre-operative positioning, peri-operative retractor management, general facilitation of the procedure.  He was also utilized for primary wound closure at the end of the case.              Pietro Cassis Alvan Dame, M.D.    12/31/2015 12:20 PM

## 2015-12-31 NOTE — Interval H&P Note (Signed)
History and Physical Interval Note:  12/31/2015 9:56 AM  Brittany [name]  has presented today for surgery, with the diagnosis of Right Knee OA  The various methods of treatment have been discussed with the patient and family. After consideration of risks, benefits and other options for treatment, the patient has consented to  Procedure(s): RIGHT TOTAL KNEE ARTHROPLASTY (Right) as a surgical intervention .  The patient's history has been reviewed, patient examined, no change in status, stable for surgery.  I have reviewed the patient's chart and labs.  Questions were answered to the patient's satisfaction.     Mauri Pole

## 2015-12-31 NOTE — Anesthesia Procedure Notes (Signed)
Spinal  Start time: 12/31/2015 10:54 AM End time: 12/31/2015 10:59 AM Staffing Anesthesiologist: Franne Grip Resident/CRNA: Darlys Gales R Performed: resident/CRNA  Preanesthetic Checklist Completed: patient identified, site marked, surgical consent, pre-op evaluation, timeout performed, IV checked, risks and benefits discussed and monitors and equipment checked Spinal Block Patient position: sitting Prep: ChloraPrep Patient monitoring: heart rate, continuous pulse ox and blood pressure Approach: midline Location: L3-4 Injection technique: single-shot Needle Needle type: Spinocan  Needle gauge: 22 G Needle length: 9 cm Needle insertion depth: 6.5 cm Assessment Sensory level: T6

## 2016-01-01 ENCOUNTER — Encounter: Payer: Self-pay | Admitting: Internal Medicine

## 2016-01-01 LAB — BASIC METABOLIC PANEL
Anion gap: 6 (ref 5–15)
BUN: 13 mg/dL (ref 6–20)
CALCIUM: 7.8 mg/dL — AB (ref 8.9–10.3)
CO2: 22 mmol/L (ref 22–32)
Chloride: 108 mmol/L (ref 101–111)
Creatinine, Ser: 0.59 mg/dL (ref 0.44–1.00)
GFR calc Af Amer: >60 mL/min (ref >60)
GLUCOSE: 135 mg/dL — AB (ref 65–99)
Potassium: 3.9 mmol/L (ref 3.5–5.1)
Sodium: 136 mmol/L (ref 135–145)

## 2016-01-01 LAB — CBC
HCT: 26.4 % — ABNORMAL LOW (ref 36.0–46.0)
Hemoglobin: 8.3 g/dL — ABNORMAL LOW (ref 12.0–15.0)
MCH: 27.1 pg (ref 26.0–34.0)
MCHC: 31.4 g/dL (ref 30.0–36.0)
MCV: 86.3 fL (ref 78.0–100.0)
PLATELETS: 227 10*3/uL (ref 150–400)
RBC: 3.06 MIL/uL — ABNORMAL LOW (ref 3.87–5.11)
RDW: 15.2 % (ref 11.5–15.5)
WBC: 11 10*3/uL — ABNORMAL HIGH (ref 4.0–10.5)

## 2016-01-01 MED ORDER — FERROUS SULFATE 325 (65 FE) MG PO TABS: 325.0000 mg | ORAL_TABLET | Freq: Three times a day (TID) | ORAL | 3 refills | Status: DC

## 2016-01-01 MED ORDER — DOCUSATE SODIUM 100 MG PO CAPS: 100.0000 mg | ORAL_CAPSULE | Freq: Two times a day (BID) | ORAL | 0 refills | Status: DC

## 2016-01-01 MED ORDER — METHOCARBAMOL 500 MG PO TABS: 500.0000 mg | ORAL_TABLET | Freq: Four times a day (QID) | ORAL | 0 refills | Status: DC | PRN

## 2016-01-01 MED ORDER — ASPIRIN 81 MG PO CHEW: 81.0000 mg | CHEWABLE_TABLET | Freq: Two times a day (BID) | ORAL | 0 refills | Status: AC

## 2016-01-01 MED ORDER — ONDANSETRON HCL 4 MG PO TABS: 4.0000 mg | ORAL_TABLET | Freq: Four times a day (QID) | ORAL | 0 refills | Status: DC | PRN

## 2016-01-01 MED ORDER — HYDROCODONE-ACETAMINOPHEN 7.5-325 MG PO TABS: 1.0000 | ORAL_TABLET | ORAL | 0 refills | Status: DC | PRN

## 2016-01-01 MED ORDER — POLYETHYLENE GLYCOL 3350 17 G PO PACK: 17.0000 g | PACK | Freq: Two times a day (BID) | ORAL | 0 refills | Status: DC

## 2016-01-01 NOTE — Evaluation (Signed)
Occupational Therapy Evaluation Patient Details Name: Brittany [name] MRN: [id number] DOB: 1945/05/01 Today's Date: 01/01/2016    History of Present Illness Pt s/p R TKR and with hx of Breast CA   Clinical Impression   This 70 year old female was admitted for the above sx. All education was completed.  No further OT is needed at this time    Follow Up Recommendations  Supervision/Assistance - 24 hour    Equipment Recommendations  None recommended by OT    Recommendations for Other Services       Precautions / Restrictions Precautions Precautions: Knee;Fall Restrictions Weight Bearing Restrictions: No Other Position/Activity Restrictions: WBAT      Mobility Bed Mobility Overal bed mobility: Needs Assistance Bed Mobility: Supine to Sit     Supine to sit: Supervision     General bed mobility comments: cues for sequence and use of L LE to self assist BY PT  Transfers Overall transfer level: Needs assistance Equipment used: Rolling walker (2 wheeled) Transfers: Sit to/from Stand Sit to Stand: ;Supervision         General transfer comment: cues for LE management and use of UEs to self assist    Balance                                            ADL Overall ADL's : Needs assistance/impaired     Grooming: Supervision/safety;Standing   Upper Body Bathing: Set up;Sitting   Lower Body Bathing: Sit to/from stand;Supervison/ safety   Upper Body Dressing : Set up;Sitting   Lower Body Dressing: Minimal assistance;Sit to/from stand   Toilet Transfer: Supervision/safety;Ambulation;RW   Toileting- Clothing Manipulation and Hygiene: Supervision/safety;Sit to/from stand   Tub/ Shower Transfer: Min guard;Ambulation;3 in 1     General ADL Comments: pt donned clothing and practiced shower transfer.  Supervision to ambulate back to chair.  Husband will assist with socks/teds as neede     Vision     Perception     Praxis      Pertinent  Vitals/Pain Pain Assessment: 0-10 Pain Score: 4  Pain Location: R knee Pain Descriptors / Indicators: Aching;Sore Pain Intervention(s): Limited activity within patient's tolerance;Monitored during session;Premedicated before session;Ice applied     Hand Dominance     Extremity/Trunk Assessment             Communication Communication Communication: No difficulties   Cognition Arousal/Alertness: Awake/alert Behavior During Therapy: WFL for tasks assessed/performed Overall Cognitive Status: Within Functional Limits for tasks assessed                     General Comments       Exercises      Shoulder Instructions      Home Living Family/patient expects to be discharged to:: Private residence Living Arrangements: Spouse/significant other Available Help at Discharge: Family               Bathroom Shower/Tub: Walk-in Human resources officer: Standard         Additional Comments: pt is borrowing 3:1 and RW from friend; has a built in Information systems manager      Prior Functioning/Environment Level of Independence: Independent                 OT Problem List:     OT Treatment/Interventions:      OT Goals(Current goals  can be found in the care plan section) Acute Rehab OT Goals Patient Stated Goal: Regain IND OT Goal Formulation: All assessment and education complete, DC therapy  OT Frequency:     Barriers to D/C:            Co-evaluation              End of Session Nurse Communication:  (queasiness)  Activity Tolerance: Patient tolerated treatment well Patient left: in chair;with call bell/phone within reach   Time: 1510-1521 OT Time Calculation (min): 11 min Charges:  OT General Charges $OT Visit: 1 Procedure OT Evaluation $OT Eval Low Complexity: 1 Procedure G-Codes:    Brittany [name] Jan 12, 2016, 3:33 PM  Brittany [name], OTR/L [phone] 01-12-2016

## 2016-01-01 NOTE — Evaluation (Signed)
Physical Therapy Evaluation Patient Details Name: Brittany [name] MRN: [id number] DOB: 1945/11/22 Today's Date: 01/01/2016   History of Present Illness  Pt s/p R TKR and with hx of Breast CA  Clinical Impression  Pt s/p R TKR presents with decreased R LE strength/ROM and post op pain limiting functional mobility.  Pt should progress to dc home with family assist and HHPT follow up.    Follow Up Recommendations Home health PT    Equipment Recommendations  None recommended by PT    Recommendations for Other Services OT consult     Precautions / Restrictions Precautions Precautions: Knee;Fall Restrictions Weight Bearing Restrictions: No Other Position/Activity Restrictions: WBAT      Mobility  Bed Mobility Overal bed mobility: Needs Assistance Bed Mobility: Supine to Sit;Sit to Supine     Supine to sit: Min guard Sit to supine: Min guard   General bed mobility comments: cues for sequence and use of L LE to self assist  Transfers Overall transfer level: Needs assistance Equipment used: Rolling walker (2 wheeled) Transfers: Sit to/from Stand Sit to Stand: Min assist         General transfer comment: cues for LE management and use of UEs to self assist  Ambulation/Gait Ambulation/Gait assistance: Min assist Ambulation Distance (Feet): 32 Feet Assistive device: Rolling walker (2 wheeled) Gait Pattern/deviations: Step-to pattern;Decreased step length - right;Decreased step length - left;Shuffle;Trunk flexed Gait velocity: decr Gait velocity interpretation: Below normal speed for age/gender General Gait Details: cues for sequence, posture and position from [employer]            Wheelchair Mobility    Modified Rankin (Stroke Patients Only)       Balance                                             Pertinent Vitals/Pain Pain Assessment: 0-10 Pain Score: 4  Pain Location: R knee Pain Descriptors / Indicators: Aching;Sore Pain  Intervention(s): Limited activity within patient's tolerance;Monitored during session;Premedicated before session;Ice applied    Home Living Family/patient expects to be discharged to:: Private residence Living Arrangements: Spouse/significant other Available Help at Discharge: Family Type of Home: House Home Access: Stairs to enter   Secretary/administrator of Steps: 1 Home Layout: One level Home Equipment: Environmental consultant - 2 wheels;Cane - single point;Bedside commode Additional Comments: Pt is borrowing equipment from friend who had TKR    Prior Function Level of Independence: Independent               Hand Dominance        Extremity/Trunk Assessment   Upper Extremity Assessment: Overall WFL for tasks assessed           Lower Extremity Assessment: RLE deficits/detail RLE Deficits / Details: 3-/5 quads with AAROM at knee -10 - 45    Cervical / Trunk Assessment: Normal  Communication   Communication: No difficulties  Cognition Arousal/Alertness: Awake/alert Behavior During Therapy: WFL for tasks assessed/performed Overall Cognitive Status: Within Functional Limits for tasks assessed                      General Comments      Exercises Total Joint Exercises Ankle Circles/Pumps: AROM;Both;15 reps;Supine Quad Sets: AROM;Both;10 reps;Supine Heel Slides: AAROM;15 reps;Supine;Right Straight Leg Raises: AAROM;AROM;Right;10 reps;Supine   Assessment/Plan    PT Assessment Patient needs continued PT services  PT Problem List Decreased strength;Decreased range of motion;Decreased activity tolerance;Decreased mobility;Pain;Decreased knowledge of use of DME          PT Treatment Interventions DME instruction;Gait training;Stair training;Functional mobility training;Therapeutic activities;Therapeutic exercise;Patient/family education    PT Goals (Current goals can be found in the Care Plan section)  Acute Rehab PT Goals Patient Stated Goal: Regain IND PT Goal  Formulation: With patient Time For Goal Achievement: 01/04/16 Potential to Achieve Goals: Good    Frequency 7X/week   Barriers to discharge        Co-evaluation               End of Session Equipment Utilized During Treatment: Gait belt Activity Tolerance: Other (comment) (nausea) Patient left: in bed;with call bell/phone within reach;with bed alarm set Nurse Communication: Mobility status;Other (comment) (nausea)         Time: 1610-9604 PT Time Calculation (min) (ACUTE ONLY): 27 min   Charges:   PT Evaluation $PT Eval Low Complexity: 1 Procedure PT Treatments $Therapeutic Exercise: 8-22 mins   PT G Codes:        Acey [name] 2016/01/14, 12:14 PM

## 2016-01-01 NOTE — Progress Notes (Signed)
     Subjective: 1 Day Post-Op Procedure(s) (LRB): RIGHT TOTAL KNEE ARTHROPLASTY (Right)   Patient reports pain as mild, pain controlled well.  Didn't sleep well as she never sleeps well on her back.  Other than sleep no real issues throughout the night. Patient stated that she morning she started to have a little nausea. Ready to be discharged home if pain and nausea stay controlled.   Objective:   VITALS:   Vitals:   01/01/16 0116 01/01/16 0600  BP: 104/88 (!) 95/44  Pulse: 80 72  Resp: 16 16  Temp: 98.2 F (36.8 C) 98 F (36.7 C)    Dorsiflexion/Plantar flexion intact Incision: dressing C/D/I No cellulitis present Compartment soft  LABS  Recent Labs  01/01/16 0434  HGB 8.3*  HCT 26.4*  WBC 11.0*  PLT 227     Recent Labs  01/01/16 0434  NA 136  K 3.9  BUN 13  CREATININE 0.59  GLUCOSE 135*     Assessment/Plan: 1 Day Post-Op Procedure(s) (LRB): RIGHT TOTAL KNEE ARTHROPLASTY (Right) Foley cath d/c'ed Advance diet Up with therapy D/C IV fluids Discharge home with home health  Follow up in 2 weeks at [facility]. Follow up with OLIN,Manya Balash D in 2 weeks.  Contact information:  [facility] [address] [place] Brittany [name]   PAC  01/01/2016, 8:43 AM

## 2016-01-01 NOTE — Progress Notes (Signed)
OT Cancellation Note  Patient Details Name: Brittany [name] MRN: [id number] DOB: 1945-07-19   Cancelled Treatment:    Reason Eval/Treat Not Completed: Other (comment)   Pt was nauseous with PT. Will check this afternoon. Kentravious Lipford 01/01/2016, 11:26 AM  Lesle Chris, OTR/L [phone] 01/01/2016

## 2016-01-01 NOTE — Discharge Instructions (Signed)

## 2016-01-01 NOTE — Progress Notes (Signed)
Physical Therapy Treatment Patient Details Name: Brittany [name] MRN: [id number] DOB: 1945/04/05 Today's Date: 01/01/2016    History of Present Illness Pt s/p R TKR and with hx of Breast CA    PT Comments    Marked improvement in activity tolerance this pm and with no c/o nausea.  Reviewed therex and stairs with pt mobilizing well.  Follow Up Recommendations  Home health PT     Equipment Recommendations  None recommended by PT    Recommendations for Other Services OT consult     Precautions / Restrictions Precautions Precautions: Knee;Fall Restrictions Weight Bearing Restrictions: No Other Position/Activity Restrictions: WBAT    Mobility  Bed Mobility Overal bed mobility: Needs Assistance Bed Mobility: Supine to Sit     Supine to sit: Supervision     General bed mobility comments: cues for sequence and use of L LE to self assist  Transfers Overall transfer level: Needs assistance Equipment used: Rolling walker (2 wheeled) Transfers: Sit to/from Stand Sit to Stand: Min guard;Supervision         General transfer comment: cues for LE management and use of UEs to self assist  Ambulation/Gait Ambulation/Gait assistance: Min guard;Supervision Ambulation Distance (Feet): 123 Feet Assistive device: Rolling walker (2 wheeled) Gait Pattern/deviations: Step-to pattern;Decreased step length - right;Decreased step length - left;Shuffle;Trunk flexed Gait velocity: decr Gait velocity interpretation: Below normal speed for age/gender General Gait Details: cues for sequence, posture and position from RW   Stairs Stairs: Yes Stairs assistance: Min assist Stair Management: No rails;Step to pattern;Backwards;Forwards;With walker Number of Stairs: 2 General stair comments: single step twice with RW - once fwd and once bkwd - cues for sequence and foot/RW placement  Wheelchair Mobility    Modified Rankin (Stroke Patients Only)       Balance                                     Cognition Arousal/Alertness: Awake/alert Behavior During Therapy: WFL for tasks assessed/performed Overall Cognitive Status: Within Functional Limits for tasks assessed                      Exercises Total Joint Exercises Ankle Circles/Pumps: AROM;Both;15 reps;Supine Quad Sets: AROM;Both;10 reps;Supine Heel Slides: AAROM;15 reps;Supine;Right Straight Leg Raises: AAROM;AROM;Right;Supine;15 reps    General Comments        Pertinent Vitals/Pain Pain Assessment: 0-10 Pain Score: 4  Pain Location: R knee Pain Descriptors / Indicators: Aching;Sore Pain Intervention(s): Limited activity within patient's tolerance;Monitored during session;Premedicated before session;Ice applied    Home Living                      Prior Function            PT Goals (current goals can now be found in the care plan section) Acute Rehab PT Goals Patient Stated Goal: Regain IND PT Goal Formulation: With patient Time For Goal Achievement: 01/04/16 Potential to Achieve Goals: Good Progress towards PT goals: Progressing toward goals    Frequency    7X/week      PT Plan Current plan remains appropriate    Co-evaluation             End of Session Equipment Utilized During Treatment: Gait belt Activity Tolerance: Patient tolerated treatment well Patient left: in chair;with call bell/phone within reach     Time: 1440-1514 PT Time Calculation (min) (ACUTE ONLY):  34 min  Charges:  $Gait Training: 8-22 mins $Therapeutic Exercise: 8-22 mins                    G Codes:      Leighann Amadon 2016-01-28, 3:20 PM

## 2016-01-01 NOTE — Care Management Note (Signed)
Case Management Note  Patient Details  Name: Brittany [name] MRN: [id number] Date of Birth: 11-16-1945  Subjective/Objective:                  RIGHT TOTAL KNEE ARTHROPLASTY (Right)  Action/Plan: Discharge planning Expected Discharge Date:                  Expected Discharge Plan:  [place]  In-House Referral:     Discharge planning Services  CM Consult  Post Acute Care Choice:  Home Health Choice offered to:  Patient  DME Arranged:  N/A DME Agency:  NA  HH Arranged:  PT [place] Agency:  [facility]  Status of Service:  Completed, signed off  If discussed at [employer], dates discussed:    Additional Comments: CM met with pt in room to offer choice of home health agency.  Pt chooses [facility] to render HHPT.  Referral called to [facility] rep, Tim.  Pt states she has all DME needed at home.  No other CM needs were communicated. Dellie Catholic, RN 01/01/2016, 10:34 AM

## 2016-01-02 DIAGNOSIS — Z471 Aftercare following joint replacement surgery: Secondary | ICD-10-CM | POA: Diagnosis not present

## 2016-01-02 DIAGNOSIS — M810 Age-related osteoporosis without current pathological fracture: Secondary | ICD-10-CM | POA: Diagnosis not present

## 2016-01-02 DIAGNOSIS — Z96651 Presence of right artificial knee joint: Secondary | ICD-10-CM | POA: Diagnosis not present

## 2016-01-02 DIAGNOSIS — Z7982 Long term (current) use of aspirin: Secondary | ICD-10-CM | POA: Diagnosis not present

## 2016-01-02 DIAGNOSIS — Z9221 Personal history of antineoplastic chemotherapy: Secondary | ICD-10-CM | POA: Diagnosis not present

## 2016-01-02 DIAGNOSIS — Z853 Personal history of malignant neoplasm of breast: Secondary | ICD-10-CM | POA: Diagnosis not present

## 2016-01-02 DIAGNOSIS — R4189 Other symptoms and signs involving cognitive functions and awareness: Secondary | ICD-10-CM | POA: Diagnosis not present

## 2016-01-02 DIAGNOSIS — Z923 Personal history of irradiation: Secondary | ICD-10-CM | POA: Diagnosis not present

## 2016-01-02 DIAGNOSIS — F329 Major depressive disorder, single episode, unspecified: Secondary | ICD-10-CM | POA: Diagnosis not present

## 2016-01-02 DIAGNOSIS — G4700 Insomnia, unspecified: Secondary | ICD-10-CM | POA: Diagnosis not present

## 2016-01-02 DIAGNOSIS — F411 Generalized anxiety disorder: Secondary | ICD-10-CM | POA: Diagnosis not present

## 2016-01-02 DIAGNOSIS — G43809 Other migraine, not intractable, without status migrainosus: Secondary | ICD-10-CM | POA: Diagnosis not present

## 2016-01-02 DIAGNOSIS — Z79891 Long term (current) use of opiate analgesic: Secondary | ICD-10-CM | POA: Diagnosis not present

## 2016-01-06 DIAGNOSIS — Z853 Personal history of malignant neoplasm of breast: Secondary | ICD-10-CM | POA: Diagnosis not present

## 2016-01-06 DIAGNOSIS — Z7982 Long term (current) use of aspirin: Secondary | ICD-10-CM | POA: Diagnosis not present

## 2016-01-06 DIAGNOSIS — G43809 Other migraine, not intractable, without status migrainosus: Secondary | ICD-10-CM | POA: Diagnosis not present

## 2016-01-06 DIAGNOSIS — G4700 Insomnia, unspecified: Secondary | ICD-10-CM | POA: Diagnosis not present

## 2016-01-06 DIAGNOSIS — Z471 Aftercare following joint replacement surgery: Secondary | ICD-10-CM | POA: Diagnosis not present

## 2016-01-06 DIAGNOSIS — Z96651 Presence of right artificial knee joint: Secondary | ICD-10-CM | POA: Diagnosis not present

## 2016-01-06 DIAGNOSIS — F411 Generalized anxiety disorder: Secondary | ICD-10-CM | POA: Diagnosis not present

## 2016-01-06 DIAGNOSIS — Z923 Personal history of irradiation: Secondary | ICD-10-CM | POA: Diagnosis not present

## 2016-01-06 DIAGNOSIS — F329 Major depressive disorder, single episode, unspecified: Secondary | ICD-10-CM | POA: Diagnosis not present

## 2016-01-06 DIAGNOSIS — M810 Age-related osteoporosis without current pathological fracture: Secondary | ICD-10-CM | POA: Diagnosis not present

## 2016-01-06 DIAGNOSIS — R4189 Other symptoms and signs involving cognitive functions and awareness: Secondary | ICD-10-CM | POA: Diagnosis not present

## 2016-01-06 DIAGNOSIS — Z79891 Long term (current) use of opiate analgesic: Secondary | ICD-10-CM | POA: Diagnosis not present

## 2016-01-06 DIAGNOSIS — Z9221 Personal history of antineoplastic chemotherapy: Secondary | ICD-10-CM | POA: Diagnosis not present

## 2016-01-08 NOTE — Discharge Summary (Signed)
Physician Discharge Summary  Patient ID: Brittany [name] MRN: [id number] DOB/AGE: 1945/08/28 70 y.o.  Admit date: 12/31/2015 Discharge date: 01/01/2016   Procedures:  Procedure(s) (LRB): RIGHT TOTAL KNEE ARTHROPLASTY (Right)  Attending Physician:  Dr. Durene Romans   Admission Diagnoses:   Right knee primary OA / pain  Discharge Diagnoses:  Active Problems:   S/P right TKA  Past Medical History:  Diagnosis Date  . Acid reflux   . Arthritis   . Breast cancer (HCC) 11/23/2007   right, ER/PR -, HER 2 -  . Cancer [facility])    breast right  triple recet neg, lumpect, chemo, XRT  . Esophageal stricture   . Gastritis 10/2007  . History of chemotherapy    neoadjuvant chemo, last dose 03/2008  . History of radiation therapy 07/25/08 -09/11/08   right breast  . Hyperlipidemia   . Migraines    rare  . Osteopenia     HPI:    Brittany [name], 70 y.o. female, has a history of pain and functional disability in the right knee due to arthritis and has failed non-surgical conservative treatments for greater than 12 weeks to include NSAID's and/or analgesics, corticosteriod injections and activity modification.  Onset of symptoms was gradual, starting 6-8 months ago with rapidlly worsening course since that time. The patient noted prior procedures on the knee to include  mass removal from posterior aspect of knee on the right knee(s).  Patient currently rates pain in the right knee(s) at 8 out of 10 with activity. Patient has night pain, worsening of pain with activity and weight bearing, pain that interferes with activities of daily living, pain with passive range of motion, crepitus and joint swelling.  Patient has evidence of periarticular osteophytes and joint space narrowing by imaging studies.  There is no active infection.   Risks, benefits and expectations were discussed with the patient.  Risks including but not limited to the risk of anesthesia, blood clots, nerve damage, blood vessel damage,  failure of the prosthesis, infection and up to and including death.  Patient understand the risks, benefits and expectations and wishes to proceed with surgery.   PCP: [name], MD   Discharged Condition: good  Hospital Course:  Patient underwent the above stated procedure on 12/31/2015. Patient tolerated the procedure well and brought to the recovery room in good condition and subsequently to the floor.  POD #1 BP: 95/44 ; Pulse: 72 ; Temp: 98 F (36.7 C) ; Resp: 16 Patient reports pain as mild, pain controlled well.  Didn't sleep well as she never sleeps well on her back.  Other than sleep no real issues throughout the night. Patient stated that she morning she started to have a little nausea. Ready to be discharged home. Dorsiflexion/plantar flexion intact, incision: dressing C/D/I, no cellulitis present and compartment soft.   LABS  Basename    HGB     8.3  HCT     26.4    Discharge Exam: General appearance: alert, cooperative and no distress Extremities: Homans sign is negative, no sign of DVT, no edema, redness or tenderness in the calves or thighs and no ulcers, gangrene or trophic changes  Disposition: Home with follow up in 2 weeks   Follow-up Information    Shelda Pal, MD. Schedule an appointment as soon as possible for a visit in 2 week(s).   Specialty:  Orthopedic Surgery Contact information: [address] [place] [address] [phone]  [facility] .   Specialty:  Home Health Services Why:  home health physical therapy Contact information: [address] [place] [address] [place] [address] [phone]           Discharge Instructions    Call MD / Call 911    Complete by:  As directed    If you experience chest pain or shortness of breath, CALL 911 and be transported to the hospital emergency room.  If you develope a fever above 101 F, pus (white drainage) or increased drainage or redness at the wound, or calf pain,  call your surgeon's office.   Change dressing    Complete by:  As directed    Maintain surgical dressing until follow up in the clinic. If the edges start to pull up, may reinforce with tape. If the dressing is no longer working, may remove and cover with gauze and tape, but must keep the area dry and clean.  Call with any questions or concerns.   Constipation Prevention    Complete by:  As directed    Drink plenty of fluids.  Prune juice may be helpful.  You may use a stool softener, such as Colace (over the counter) 100 mg twice a day.  Use MiraLax (over the counter) for constipation as needed.   Diet - low sodium heart healthy    Complete by:  As directed    Discharge instructions    Complete by:  As directed    Maintain surgical dressing until follow up in the clinic. If the edges start to pull up, may reinforce with tape. If the dressing is no longer working, may remove and cover with gauze and tape, but must keep the area dry and clean.  Follow [facility] in 2 [facility] at [facility]. Call with any questions or concerns.   Increase activity slowly as tolerated    Complete by:  As directed    Weight bearing as tolerated with assist device (walker, cane, etc) as directed, use it as long as suggested by your surgeon or therapist, typically at least 4-6 weeks.   TED hose    Complete by:  As directed    Use stockings (TED hose) for 2 weeks on both leg(s).  You may remove them at night for sleeping.        Medication List    STOP taking these medications   aspirin EC 81 MG tablet Replaced by:  aspirin 81 MG chewable tablet     TAKE these medications   aspirin 81 MG chewable tablet Chew 1 tablet (81 mg total) by mouth 2 (two) times daily. Take for 4 weeks, then resume regular dose. Replaces:  aspirin EC 81 MG tablet   BIOTIN PO Take 1 tablet by mouth daily.   calcium carbonate 1500 (600 Ca) MG Tabs tablet Commonly known as:  OSCAL Take 600 mg of elemental calcium by mouth 2  (two) times daily with a meal.   citalopram 40 MG tablet Commonly known as:  CELEXA TAKE 1 TABLET (40 MG TOTAL) BY MOUTH DAILY.   denosumab 60 MG/ML Soln injection Commonly known as:  PROLIA Inject 60 mg into the skin once. Administer in upper arm, thigh, or abdomen What changed:  when to take this  additional instructions   diphenhydrAMINE 25 MG tablet Commonly known as:  BENADRYL Take 25 mg by mouth every 6 (six) hours as needed for allergies.   docusate sodium 100 MG capsule Commonly known as:  COLACE  Take 1 capsule (100 mg total) by mouth 2 (two) times daily.   ferrous sulfate 325 (65 FE) MG tablet Take 1 tablet (325 mg total) by mouth 3 (three) times daily after meals.   Fish Oil 1000 MG Caps Take 1 capsule by mouth daily.   HYDROcodone-acetaminophen 7.5-325 MG tablet Commonly known as:  NORCO Take 1-2 tablets by mouth every 4 (four) hours as needed for moderate pain.   ketorolac 0.4 % Soln Commonly known as:  ACULAR APPLY 1 DROP IN SURGICAL EYE FOUR TIMES A DAY STARTING 1 DAY BEFORE SURGERY   methocarbamol 500 MG tablet Commonly known as:  ROBAXIN Take 1 tablet (500 mg total) by mouth every 6 (six) hours as needed for muscle spasms.   ofloxacin 0.3 % ophthalmic solution Commonly known as:  OCUFLOX APPLY 1 DROP IN SURGICAL EYE FOUR TIMES A DAY STARTING 1 DAY BEFORE SURGERY   ondansetron 4 MG tablet Commonly known as:  ZOFRAN Take 1 tablet (4 mg total) by mouth every 6 (six) hours as needed for nausea or vomiting.   polyethylene glycol packet Commonly known as:  MIRALAX / GLYCOLAX Take 17 g by mouth 2 (two) times daily.   pravastatin 20 MG tablet Commonly known as:  PRAVACHOL Take 1 tablet (20 mg total) by mouth daily. What changed:  when to take this   prednisoLONE acetate 1 % ophthalmic suspension Commonly known as:  PRED FORTE APPLY 1 DROP IN SURGICAL EYE FOUR TIMES A DAY AFTER SURGERY   ranitidine 150 MG tablet Commonly known as:  ZANTAC Take 1  tablet (150 mg total) by mouth 2 (two) times daily.   SUMAtriptan 100 MG tablet Commonly known as:  IMITREX Take 1 tablet (100 mg total) by mouth once. May repeat in 2 hours if headache persists or recurs.   traZODone 50 MG tablet Commonly known as:  DESYREL Take 0.5-1 tablets (25-50 mg total) by mouth at bedtime as needed for sleep.   vitamin B-12 1000 MCG tablet Commonly known as:  CYANOCOBALAMIN Take 1,000 mcg by mouth daily.   VITAMIN D-3 PO Take 1 tablet by mouth daily.   zolpidem 10 MG tablet Commonly known as:  AMBIEN TAKE 1 TAB BY MOUTH NIGHTLY AT BEDTIME AS NEEDED FOR SLEEP. What changed:  how much to take  how to take this  when to take this  reasons to take this  additional instructions        Signed: Anastasio Auerbach. Mikiah Demond   PA-C  01/08/2016, 9:50 AM

## 2016-01-13 DIAGNOSIS — H2511 Age-related nuclear cataract, right eye: Secondary | ICD-10-CM | POA: Diagnosis not present

## 2016-01-13 DIAGNOSIS — M25561 Pain in right knee: Secondary | ICD-10-CM | POA: Diagnosis not present

## 2016-01-16 DIAGNOSIS — M25561 Pain in right knee: Secondary | ICD-10-CM | POA: Diagnosis not present

## 2016-01-17 ENCOUNTER — Other Ambulatory Visit: Payer: Self-pay | Admitting: Internal Medicine

## 2016-01-20 DIAGNOSIS — H2511 Age-related nuclear cataract, right eye: Secondary | ICD-10-CM | POA: Diagnosis not present

## 2016-01-20 DIAGNOSIS — M25561 Pain in right knee: Secondary | ICD-10-CM | POA: Diagnosis not present

## 2016-01-22 DIAGNOSIS — M25561 Pain in right knee: Secondary | ICD-10-CM | POA: Diagnosis not present

## 2016-01-22 NOTE — Telephone Encounter (Signed)
Done

## 2016-01-28 DIAGNOSIS — M25561 Pain in right knee: Secondary | ICD-10-CM | POA: Diagnosis not present

## 2016-01-29 ENCOUNTER — Other Ambulatory Visit: Payer: Self-pay

## 2016-01-29 ENCOUNTER — Emergency Department (HOSPITAL_COMMUNITY)
Admission: EM | Admit: 2016-01-29 | Discharge: 2016-01-29 | Disposition: A | Discharge status: Home / Self Care | Payer: PPO | Attending: Emergency Medicine | Admitting: Emergency Medicine

## 2016-01-29 ENCOUNTER — Encounter (HOSPITAL_COMMUNITY): Payer: Self-pay

## 2016-01-29 ENCOUNTER — Emergency Department (HOSPITAL_COMMUNITY): Payer: PPO

## 2016-01-29 ENCOUNTER — Telehealth: Payer: Self-pay | Admitting: Internal Medicine

## 2016-01-29 DIAGNOSIS — Z96651 Presence of right artificial knee joint: Secondary | ICD-10-CM

## 2016-01-29 DIAGNOSIS — K449 Diaphragmatic hernia without obstruction or gangrene: Secondary | ICD-10-CM | POA: Diagnosis not present

## 2016-01-29 DIAGNOSIS — Z87891 Personal history of nicotine dependence: Secondary | ICD-10-CM | POA: Diagnosis not present

## 2016-01-29 DIAGNOSIS — Z853 Personal history of malignant neoplasm of breast: Secondary | ICD-10-CM | POA: Diagnosis not present

## 2016-01-29 DIAGNOSIS — R079 Chest pain, unspecified: Secondary | ICD-10-CM | POA: Diagnosis not present

## 2016-01-29 DIAGNOSIS — Z7982 Long term (current) use of aspirin: Secondary | ICD-10-CM | POA: Diagnosis not present

## 2016-01-29 DIAGNOSIS — R0600 Dyspnea, unspecified: Secondary | ICD-10-CM | POA: Diagnosis not present

## 2016-01-29 DIAGNOSIS — R0789 Other chest pain: Secondary | ICD-10-CM | POA: Diagnosis not present

## 2016-01-29 LAB — I-STAT TROPONIN, ED: Troponin i, poc: 0 ng/mL (ref 0.00–0.08)

## 2016-01-29 LAB — CBC
HEMATOCRIT: 37.5 % (ref 36.0–46.0)
HEMOGLOBIN: 11.8 g/dL — AB (ref 12.0–15.0)
MCH: 27.8 pg (ref 26.0–34.0)
MCHC: 31.5 g/dL (ref 30.0–36.0)
MCV: 88.2 fL (ref 78.0–100.0)
Platelets: 430 10*3/uL — ABNORMAL HIGH (ref 150–400)
RBC: 4.25 MIL/uL (ref 3.87–5.11)
RDW: 16.8 % — ABNORMAL HIGH (ref 11.5–15.5)
WBC: 6.8 10*3/uL (ref 4.0–10.5)

## 2016-01-29 LAB — BASIC METABOLIC PANEL
ANION GAP: 10 (ref 5–15)
BUN: 11 mg/dL (ref 6–20)
CHLORIDE: 103 mmol/L (ref 101–111)
CO2: 24 mmol/L (ref 22–32)
CREATININE: 0.66 mg/dL (ref 0.44–1.00)
Calcium: 9.6 mg/dL (ref 8.9–10.3)
GFR calc non Af Amer: >60 mL/min (ref >60)
Glucose, Bld: 97 mg/dL (ref 65–99)
POTASSIUM: 4.7 mmol/L (ref 3.5–5.1)
Sodium: 137 mmol/L (ref 135–145)

## 2016-01-29 IMAGING — CT CT ANGIO CHEST
2 series · 12 of 30 positions shown · IV contrast (Iohexol (Omnipaque 350))
Comparison: Chest CT December 16, 2007; chest radiograph Gi[facility]

CLINICAL DATA: Chest pain and difficulty breathing. Recent knee
replacement

EXAM:
CT ANGIOGRAPHY CHEST WITH CONTRAST
TECHNIQUE: Multidetector CT imaging of the chest was performed using the
standard protocol during bolus administration of intravenous
contrast. Multiplanar CT image reconstructions and MIPs were
obtained to evaluate the vascular anatomy.
CONTRAST:  100 mL Isovue 370 nonionic

[Series 402: coronals, idose (1) · coronal · 0.45mm/px · 9 of 140 slices shown]
[im 11/140  lung]
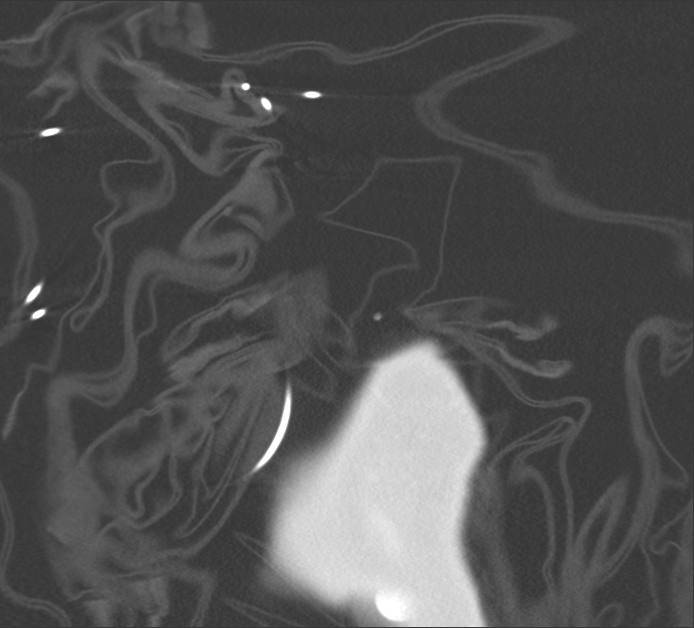
[im 33/140  mediastinal]
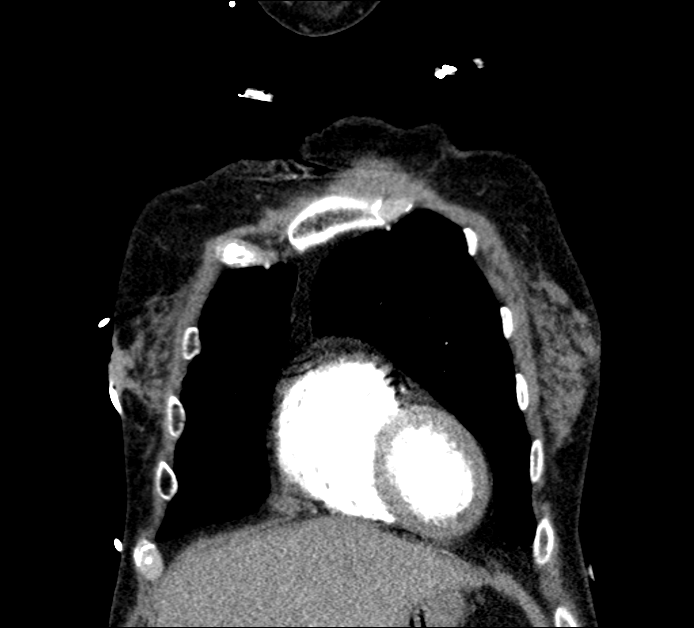
[im 43/140  lung]
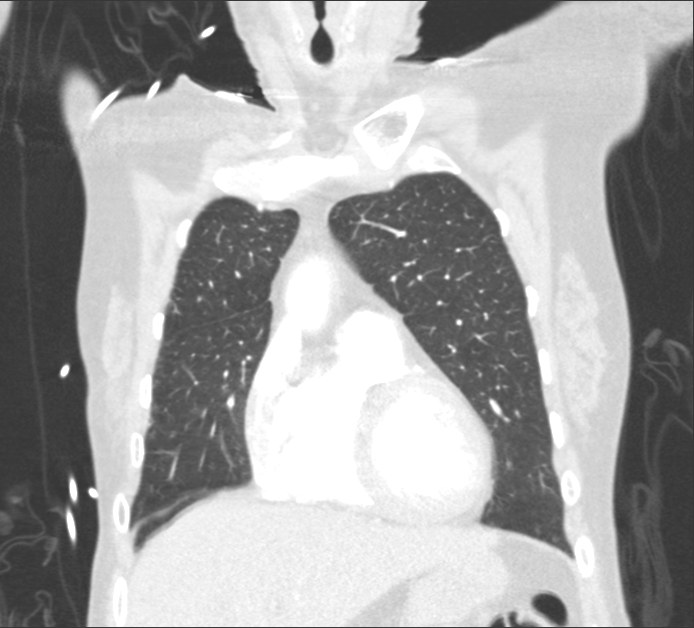
[im 65/140  mediastinal]
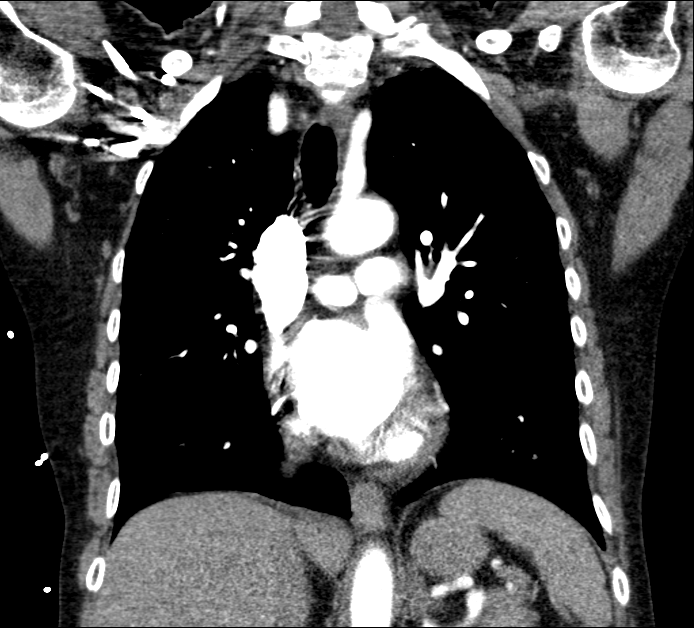
[im 70/140  lung]
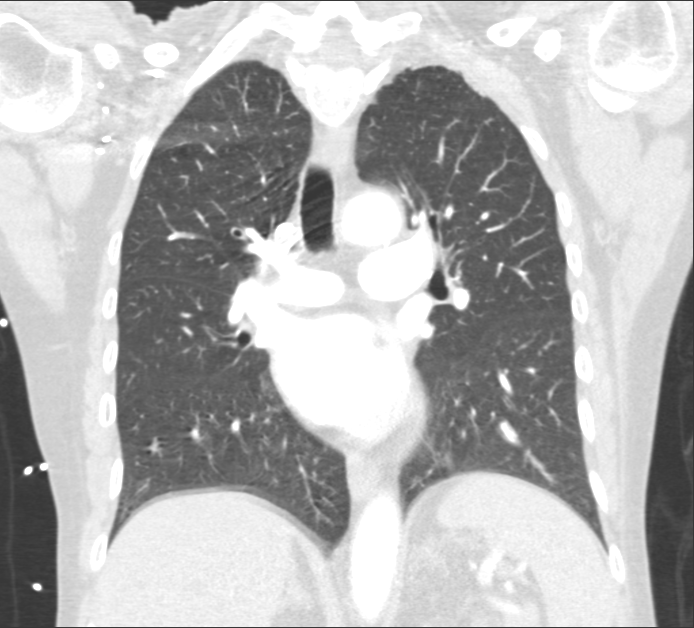
[im 75/140  mediastinal]
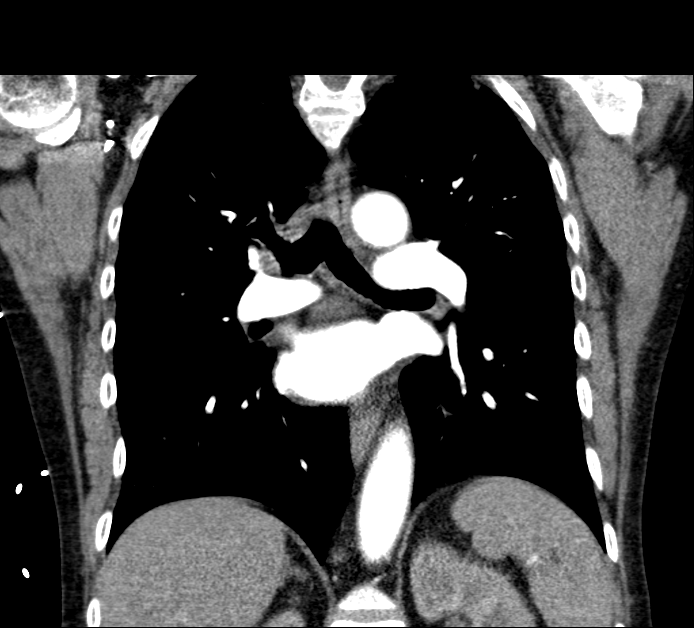
[im 97/140  lung]
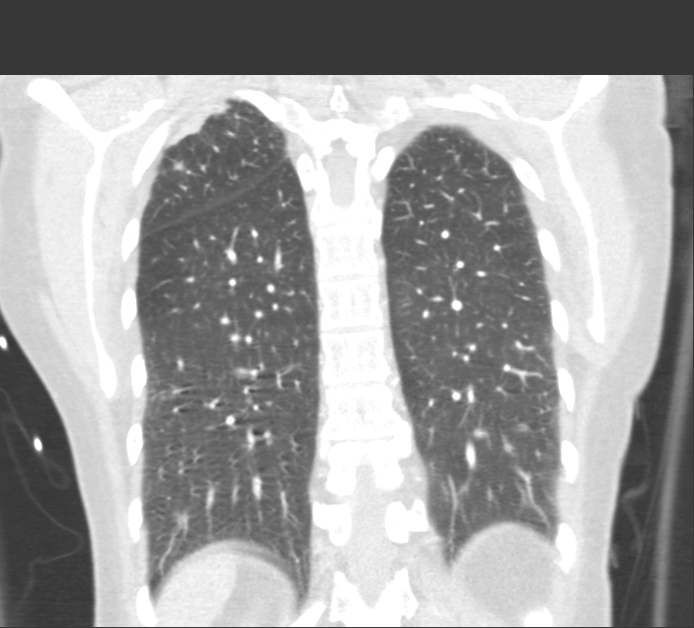
[im 107/140  mediastinal]
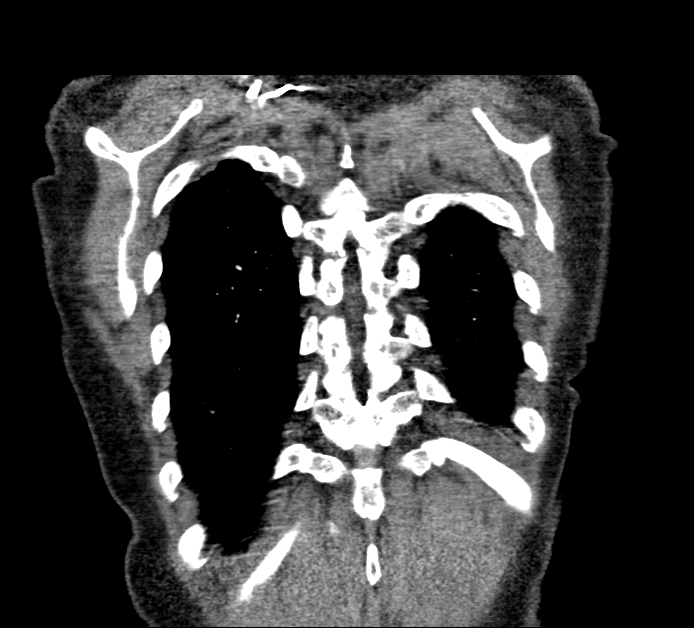
[im 129/140  lung]
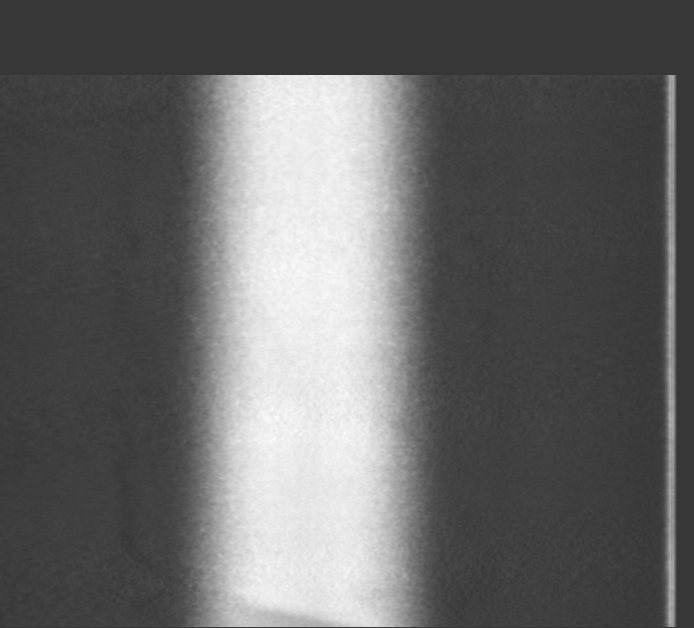

[Series 404: coronal mip, idose (1) · coronal · 0.45mm/px · 3 of 148 slices shown]
[im 68/148  mediastinal]
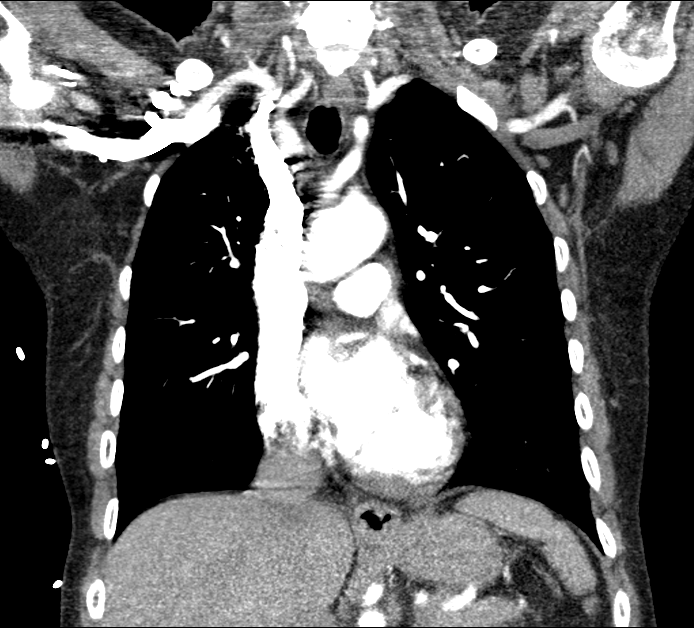
[im 74/148  mediastinal]
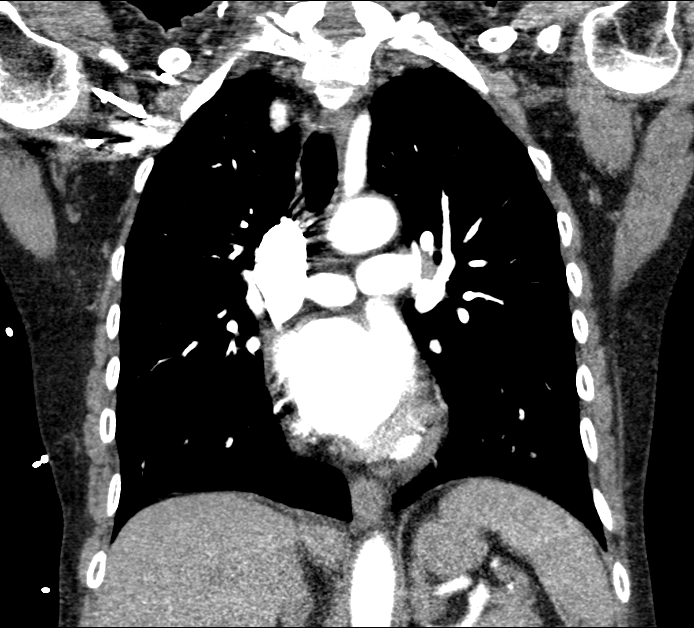
[im 80/148  mediastinal]
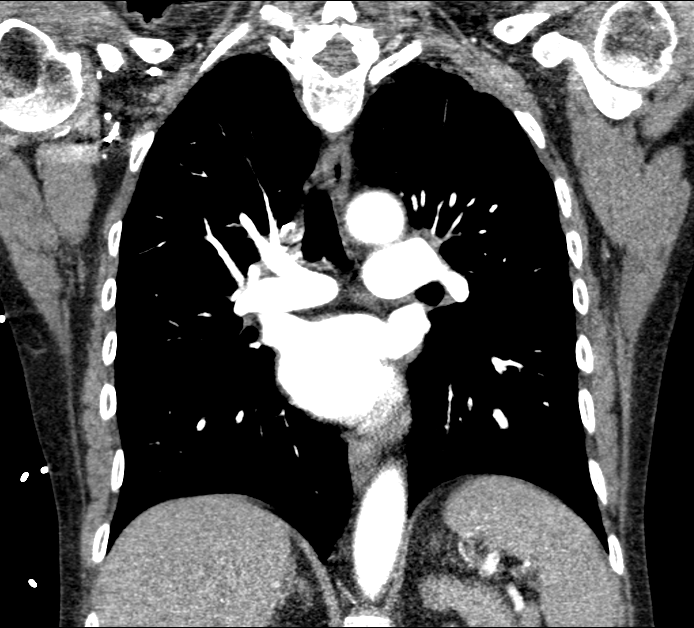

[12 of 30 positions shown; findings below may reference images not displayed]

FINDINGS: Cardiovascular: There is no demonstrable pulmonary embolus. There is
no appreciable thoracic aortic aneurysm or dissection. The
visualized great vessels appear unremarkable. Note that the right
and left common carotid arteries arise as a common trunk, an
anatomic variant. Pericardium is not appreciably thickened.

Mediastinum/Nodes: Subcentimeter nodular opacities are noted in the
thyroid consistent with multinodular goiter. No dominant thyroid
mass is appreciable. There is no appreciable thoracic adenopathy.
There is a small hiatal hernia.

Lungs/Pleura: There is scarring in the extreme apices bilaterally.
On axial slice 70 series 407, there is a nodular opacity in the
superior segment of the right lower lobe measuring 6 mm, unchanged
from the 0220 study. There is no edema or consolidation. No pleural
effusion or pleural thickening evident.

Upper Abdomen: In the visualized upper abdomen, no lesions are
apparent.

Musculoskeletal: There are no blastic or lytic bone lesions. No
chest wall lesions appreciable.

Review of the MIP images confirms the above findings.
IMPRESSION: No demonstrable pulmonary embolus.

No lung edema or consolidation. Stable 6 mm nodular opacity in the
right lower lobe. Stability since 0220 is indicative of benign
etiology. No adenopathy. Small nodular opacities in the thyroid
consistent with multinodular goiter without dominant mass evident.

Small hiatal hernia present.

## 2016-01-29 MED ORDER — IOPAMIDOL (ISOVUE-370) INJECTION 76%
INTRAVENOUS | Status: AC
  Administered 2016-01-29: 100 mL
  Filled 2016-01-29: qty 100

## 2016-01-29 MED ORDER — LANSOPRAZOLE 15 MG PO CPDR: 15.0000 mg | DELAYED_RELEASE_CAPSULE | Freq: Every day | ORAL | 0 refills | Status: DC

## 2016-01-29 NOTE — ED Triage Notes (Signed)
Patient complains of a few episodes of near syncope and intermittent chest pain since knee replacement 10/31. States that the pain this am started at 10 and hasn't resolved, shortness of breath with same. NAD

## 2016-01-29 NOTE — ED Notes (Signed)
Pt stable, ambulatory, states understanding of discharge instructions 

## 2016-01-29 NOTE — Telephone Encounter (Signed)
Patient Name: Brittany [name]  DOB: April 12, 1945    Initial Comment Caller has had some unusual feeling in chest. just had knee replaced 10/31   Nurse Assessment  Nurse: Leilani Merl, RN, Heather Date/Time (Eastern Time): 01/29/2016 11:03:27 AM  Confirm and document reason for call. If symptomatic, describe symptoms. You must click the next button to save text entered. ---Caller has had some unusual feeling in chest off and on since the surgery. just had knee replaced 10/3. she is having chest pain today.  Does the patient have any new or worsening symptoms? ---Yes  Will a triage be completed? ---Yes  Related visit to physician within the last 2 weeks? ---No  Does the PT have any chronic conditions? (i.e. diabetes, asthma, etc.) ---Yes  List chronic conditions. ---See MR  Is this a behavioral health or substance abuse call? ---No     Guidelines    Guideline Title Affirmed Question Affirmed Notes  Chest Pain [1] Chest pain lasts > 5 minutes AND [2] age > 14    Final Disposition User   Call EMS 911 Now [place], RN, [facility] - ED   Disagree/Comply: Disagree  Disagree/Comply Reason: Disagree with instructions

## 2016-01-29 NOTE — ED Notes (Signed)
Pt now back from CT.

## 2016-01-29 NOTE — ED Notes (Signed)
Pt back from x-ray.

## 2016-01-29 NOTE — ED Notes (Signed)
Patient transported to CT 

## 2016-01-29 NOTE — ED Notes (Signed)
Pt with recent knee replacement in October. States she has been so focused on the knee that the chest pains weren't on her mind. Now reports she has noticed intermittent pains and feeling tired. Denies SOB. recent travel by car 2 1/2 hours.

## 2016-01-29 NOTE — ED Notes (Signed)
Pt reports chest pain 5/10. NAD at this time. Ambulating to bathroom. EDP has been notified about pain.

## 2016-01-29 NOTE — ED Provider Notes (Signed)
[facility] DEPT Provider Note   CSN: [id number] Arrival date & time: 01/29/16  1135     History   Chief Complaint Chief Complaint  Patient presents with  . Chest Pain    HPI Brittany [name] is a 70 y.o. female.  HPI Patient has been having variable chest pain for several weeks. She is status post right knee replacement. She was more focused on doing her physical therapy in the knee and did not pay too much attention to the intermittent chest discomfort she is getting. In association with that the patient would feel lightheaded but did not have syncopal episode. She has experienced some pain initially on the left side of her lower chest as well as the right. Today however she's had some persistent central chest discomfort. Mild associated shortness of breath and lightheadedness. She is having some discomfort in her knee but she feels that is progressing. Some sensation of discomfort behind the lower leg. Patient contacted her provider and she was advised to come to the emergency department for further diagnostic evaluation for heart problems or blood clot. Past Medical History:  Diagnosis Date  . Acid reflux   . Arthritis   . Breast cancer ([place]) 11/23/2007   right, ER/PR -, HER 2 -  . Cancer [facility])    breast right  triple recet neg, lumpect, chemo, XRT  . Esophageal stricture   . Gastritis 10/2007  . History of chemotherapy    neoadjuvant chemo, last dose 03/2008  . History of radiation therapy 07/25/08 -09/11/08   right breast  . Hyperlipidemia   . Migraines    rare  . Osteopenia     Patient Active Problem List   Diagnosis Date Noted  . 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 Baker's cyst, unruptured 09/27/2015  . Degenerative arthritis of right knee 09/27/2015  . Right knee pain 07/31/2015  . Insomnia 07/31/2015  . Onychomycosis 09/25/2014  . Cognitive deficits 09/25/2014  . Urinary frequency 05/21/2014  . Mitral valve prolapse 05/08/2014  . Mitral regurgitation 05/08/2014  .  Breast cancer, right breast ([place]) 03/12/2014  . Burning with urination 03/06/2014  . Acute cystitis with positive culture 03/06/2014  . Chest pain at rest 02/09/2014  . Dyslipidemia 02/09/2014  . Myalgia 07/18/2013  . History of radiation therapy   . Hiatal hernia   . History of chemotherapy   . Osteoporosis 03/21/2012  . Seasonal and perennial allergic rhinitis 12/17/2011  . Well adult exam 11/11/2010  . B12 deficiency 10/09/2009  . TOBACCO USE, QUIT 01/31/2009  . THYROID NODULE 12/25/2008  . ESOPHAGEAL STRICTURE 11/23/2007  . GERD 11/23/2007  . VARICOSE VEIN 12/29/2006  . Anxiety state 12/23/2006  . Depression 12/23/2006  . 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 Disorder of bone and cartilage 12/23/2006    Past Surgical History:  Procedure Laterality Date  . BREAST LUMPECTOMY  06/26/2008   right, CA   XRT, chemo  . EYE SURGERY     left eye cataract surgery 12/23/2015  . growth removed per right thigh    . JOINT REPLACEMENT  12/31/2015   RTK  . TOTAL KNEE ARTHROPLASTY Right 12/31/2015   Procedure: RIGHT TOTAL KNEE ARTHROPLASTY;  Surgeon: [name], MD;  Location: [facility] ORS;  Service: Orthopedics;  Laterality: Right;    OB History    Gravida Para Term Preterm AB Living   3 3       3    SAB TAB Ectopic Multiple Live Births  Home Medications    Prior to Admission medications   Medication Sig Start Date End Date Taking? Authorizing Provider  aspirin 81 MG chewable tablet Chew 1 tablet (81 mg total) by mouth 2 (two) times daily. Take for 4 weeks, then resume regular dose. 01/01/16 01/31/16  Danae Orleans, PA-C  BIOTIN PO Take 1 tablet by mouth daily.     Historical Provider, MD  calcium carbonate (OSCAL) 1500 (600 Ca) MG TABS tablet Take 600 mg of elemental calcium by mouth 2 (two) times daily with a meal.    Historical Provider, MD  Cholecalciferol (VITAMIN D-3 PO) Take 1 tablet by mouth daily.     Historical Provider, MD  citalopram  (CELEXA) 40 MG tablet TAKE 1 TABLET (40 MG TOTAL) BY MOUTH DAILY. 07/31/15   Evie Lacks Plotnikov, MD  denosumab (PROLIA) 60 MG/ML SOLN injection Inject 60 mg into the skin once. Administer in upper arm, thigh, or abdomen Patient taking differently: Inject 60 mg into the skin every 6 (six) months. Administer in upper arm, thigh, or abdomen 08/14/15   Sylvan Cheese, NP  diphenhydrAMINE (BENADRYL) 25 MG tablet Take 25 mg by mouth every 6 (six) hours as needed for allergies.     Historical Provider, MD  docusate sodium (COLACE) 100 MG capsule Take 1 capsule (100 mg total) by mouth 2 (two) times daily. 01/01/16   Danae Orleans, PA-C  ferrous sulfate 325 (65 FE) MG tablet Take 1 tablet (325 mg total) by mouth 3 (three) times daily after meals. 01/01/16   Danae Orleans, PA-C  HYDROcodone-acetaminophen (NORCO) 7.5-325 MG tablet Take 1-2 tablets by mouth every 4 (four) hours as needed for moderate pain. 01/01/16   Danae Orleans, PA-C  ketorolac (ACULAR) 0.4 % SOLN APPLY 1 DROP IN SURGICAL EYE FOUR TIMES A DAY STARTING 1 DAY BEFORE SURGERY 12/17/15   Historical Provider, MD  lansoprazole (PREVACID) 15 MG capsule Take 1 capsule (15 mg total) by mouth daily at 12 noon. 01/29/16   Charlesetta Shanks, MD  methocarbamol (ROBAXIN) 500 MG tablet Take 1 tablet (500 mg total) by mouth every 6 (six) hours as needed for muscle spasms. 01/01/16   Danae Orleans, PA-C  ofloxacin (OCUFLOX) 0.3 % ophthalmic solution APPLY 1 DROP IN SURGICAL EYE FOUR TIMES A DAY STARTING 1 DAY BEFORE SURGERY 12/16/15   Historical Provider, MD  Omega-3 Fatty Acids (FISH OIL) 1000 MG CAPS Take 1 capsule by mouth daily.      Historical Provider, MD  ondansetron (ZOFRAN) 4 MG tablet Take 1 tablet (4 mg total) by mouth every 6 (six) hours as needed for nausea or vomiting. 01/01/16   Danae Orleans, PA-C  polyethylene glycol (MIRALAX / GLYCOLAX) packet Take 17 g by mouth 2 (two) times daily. 01/01/16   Danae Orleans, PA-C  pravastatin (PRAVACHOL)  20 MG tablet Take 1 tablet (20 mg total) by mouth daily. Patient taking differently: Take 20 mg by mouth at bedtime.  07/31/15   Evie Lacks Plotnikov, MD  prednisoLONE acetate (PRED FORTE) 1 % ophthalmic suspension APPLY 1 DROP IN SURGICAL EYE FOUR TIMES A DAY AFTER SURGERY 12/16/15   Historical Provider, MD  ranitidine (ZANTAC) 150 MG tablet Take 1 tablet (150 mg total) by mouth 2 (two) times daily. 07/31/15   Evie Lacks Plotnikov, MD  SUMAtriptan (IMITREX) 100 MG tablet Take 1 tablet (100 mg total) by mouth once. May repeat in 2 hours if headache persists or recurs. 07/31/15   Evie Lacks Plotnikov, MD  traZODone (DESYREL) 50 MG tablet  Take 0.5-1 tablets (25-50 mg total) by mouth at bedtime as needed for sleep. 12/24/15   Evie Lacks Plotnikov, MD  vitamin B-12 (CYANOCOBALAMIN) 1000 MCG tablet Take 1,000 mcg by mouth daily.    Historical Provider, MD  zolpidem (AMBIEN) 10 MG tablet TAKE 1 TABLET BY MOUTH AT BEDTIME AS NEEDED FOR SLEEP 01/21/16   Cassandria Anger, MD    Family History Family History  Problem Relation Age of Onset  . Heart disease Father   . Cancer Father     lung  . Cancer Sister 43    Breast & Colorectal  . Epilepsy Mother   . Dementia Mother     Social History Social History  Substance Use Topics  . Smoking status: Former Smoker    Packs/day: 0.25    Years: 5.00    Types: Cigarettes    Quit date: 03/03/1967  . Smokeless tobacco: Never Used     Comment: 6 cig per day when she was smoking/quit years ago  . Alcohol use 0.0 oz/week     Comment: 1 beer per day      Allergies   Boniva [ibandronic acid] and Lipitor [atorvastatin]   Review of Systems Review of Systems  10 Systems reviewed and are negative for acute change except as noted in the HPI.  Physical Exam Updated Vital Signs BP 122/66   Pulse 71   Temp 98 F (36.7 C) (Oral)   Resp 11   LMP 03/02/1994 (Approximate)   SpO2 98%   Physical Exam  Constitutional: She appears well-developed and  well-nourished. No distress.  HENT:  Head: Normocephalic and atraumatic.  Eyes: Conjunctivae are normal.  Neck: Neck supple.  Cardiovascular: Normal rate, regular rhythm, normal heart sounds and intact distal pulses.   No murmur heard. Pulmonary/Chest: Effort normal and breath sounds normal. No respiratory distress.  Abdominal: Soft. There is no tenderness.  Musculoskeletal: She exhibits no edema.  Patient has a incision over the right knee that is healing well. No erythema margins. No effusion. Joint is not warm. No significant peripheral edema the lower portion of the leg.  Neurological: She is alert.  Skin: Skin is warm and dry.  Psychiatric: She has a normal mood and affect.  Nursing note and vitals reviewed.    ED Treatments / Results  Labs (all labs ordered are listed, but only abnormal results are displayed) Labs Reviewed  CBC - Abnormal; Notable for the following:       Result Value   Hemoglobin 11.8 (*)    RDW 16.8 (*)    Platelets 430 (*)    All other components within normal limits  BASIC METABOLIC PANEL  I-STAT TROPOININ, ED    EKG  EKG Interpretation  Date/Time:  Wednesday January 29 2016 11:46:29 EST Ventricular Rate:  83 PR Interval:  126 QRS Duration: 96 QT Interval:  410 QTC Calculation: 481 R Axis:   80 Text Interpretation:  Sinus rhythm with occasional Premature ventricular complexes Moderate voltage criteria for LVH, may be normal variant Borderline ECG agree. nosignificant change from old Confirmed by Johnney Killian, [place], Jeannie Done [phone]) on 01/29/2016 3:34:59 PM       Radiology Dg Chest 2 View  Result Date: 01/29/2016 CLINICAL DATA:  Chest pain EXAM: CHEST  2 VIEW COMPARISON:  12/11/2013 FINDINGS: Normal heart size. No pleural effusion or edema. The lungs are hyperinflated but clear. Coarsened interstitial markings are identified bilaterally. No superimposed airspace consolidation. IMPRESSION: 1. No acute cardiopulmonary abnormalities. 2. Coarsened  interstitial markings and  hyperinflation suggestive of COPD. Electronically Signed   By: Kerby Moors M.D.   On: 01/29/2016 12:11   Ct Angio Chest Pe W/cm &/or Wo Cm  Result Date: 01/29/2016 CLINICAL DATA:  Chest pain and difficulty breathing. Recent knee replacement EXAM: CT ANGIOGRAPHY CHEST WITH CONTRAST TECHNIQUE: Multidetector CT imaging of the chest was performed using the standard protocol during bolus administration of intravenous contrast. Multiplanar CT image reconstructions and MIPs were obtained to evaluate the vascular anatomy. CONTRAST:  100 mL Isovue 370 nonionic COMPARISON:  Chest CT December 16, 2007; chest radiograph January 29, 2016 FINDINGS: Cardiovascular: There is no demonstrable pulmonary embolus. There is no appreciable thoracic aortic aneurysm or dissection. The visualized great vessels appear unremarkable. Note that the right and left common carotid arteries arise as a common trunk, an anatomic variant. Pericardium is not appreciably thickened. Mediastinum/Nodes: Subcentimeter nodular opacities are noted in the thyroid consistent with multinodular goiter. No dominant thyroid mass is appreciable. There is no appreciable thoracic adenopathy. There is a small hiatal hernia. Lungs/Pleura: There is scarring in the extreme apices bilaterally. On axial slice 70 series [id number], there is a nodular opacity in the superior segment of the right lower lobe measuring 6 mm, unchanged from the 2009 study. There is no edema or consolidation. No pleural effusion or pleural thickening evident. Upper Abdomen: In the visualized upper abdomen, no lesions are apparent. Musculoskeletal: There are no blastic or lytic bone lesions. No chest Saathoff lesions appreciable. Review of the MIP images confirms the above findings. IMPRESSION: No demonstrable pulmonary embolus. No lung edema or consolidation. Stable 6 mm nodular opacity in the right lower lobe. Stability since 2009 is indicative of benign etiology. No  adenopathy. Small nodular opacities in the thyroid consistent with multinodular goiter without dominant mass evident. Small hiatal hernia present. Electronically Signed   By: Lowella Grip III M.D.   On: 01/29/2016 14:57    Procedures Procedures (including critical care time)  Medications Ordered in ED Medications  iopamidol (ISOVUE-370) 76 % injection (100 mLs  Contrast Given 01/29/16 1438)     Initial Impression / Assessment and Plan / ED Course  I have reviewed the triage vital signs and the nursing notes.  Pertinent labs & imaging results that were available during my care of the patient were reviewed by me and considered in my medical decision making (see chart for details).  Clinical Course     Final Clinical Impressions(s) / ED Diagnoses   Final diagnoses:  Atypical chest pain  Hiatal hernia  Status post right knee replacement  Patient presented with variable episodes of chest discomfort with associated lightheadedness. Primary concern was possible PE with the patient's history of recent knee replacement. CT is negative for PE. Patient is clinically stable. Pattern is atypical for cardiac ischemia. Patient's EKG is unchanged from baseline and troponin is negative. CT does identify small hiatal hernia. Patient also has been taking anti-inflammatories for her knee but has discontinued them as of 4 days ago. Patient will be placed on Prevacid and will continue her ranitidine for 2 weeks treatment. She is instructed to follow-up with her primary care physician for recheck.  New Prescriptions New Prescriptions   LANSOPRAZOLE (PREVACID) 15 MG CAPSULE    Take 1 capsule (15 mg total) by mouth daily at 12 noon.     Charlesetta Shanks, MD 01/29/16 [phone]

## 2016-01-29 NOTE — ED Notes (Signed)
ED Provider at bedside. 

## 2016-01-29 NOTE — ED Notes (Signed)
[employer] notified of patients chest pain.

## 2016-01-30 ENCOUNTER — Telehealth: Payer: Self-pay

## 2016-01-30 DIAGNOSIS — M25561 Pain in right knee: Secondary | ICD-10-CM | POA: Diagnosis not present

## 2016-01-30 NOTE — Telephone Encounter (Signed)
Agree. Thx 

## 2016-01-30 NOTE — Telephone Encounter (Signed)
Called patient to reconcile/review medications as requested by [facility]. Patient s/p knee surgery by Dr. Alvan Dame ([place]) on 12/31/15. Patient was evaluated in ED on 01/29/16 for chest pain and lightheadedness. PE ruled out, started on Prevacid. Patient states chest discomfort is better, but continues to experience knee pain without relief from pain med. Patient request suggestion for OTC medication for knee pain. Advised patient to contact surgeon for medication recommendation. Patient verbalized understanding, follow up ED appointment with PCP scheduled.

## 2016-02-04 DIAGNOSIS — M25561 Pain in right knee: Secondary | ICD-10-CM | POA: Diagnosis not present

## 2016-02-05 ENCOUNTER — Encounter: Payer: Self-pay | Admitting: Internal Medicine

## 2016-02-05 ENCOUNTER — Ambulatory Visit (INDEPENDENT_AMBULATORY_CARE_PROVIDER_SITE_OTHER): Payer: PPO | Admitting: Internal Medicine

## 2016-02-05 DIAGNOSIS — Z96651 Presence of right artificial knee joint: Secondary | ICD-10-CM | POA: Diagnosis not present

## 2016-02-05 DIAGNOSIS — K219 Gastro-esophageal reflux disease without esophagitis: Secondary | ICD-10-CM | POA: Diagnosis not present

## 2016-02-05 DIAGNOSIS — E538 Deficiency of other specified B group vitamins: Secondary | ICD-10-CM

## 2016-02-05 DIAGNOSIS — E041 Nontoxic single thyroid nodule: Secondary | ICD-10-CM

## 2016-02-05 DIAGNOSIS — R079 Chest pain, unspecified: Secondary | ICD-10-CM

## 2016-02-05 MED ORDER — GABAPENTIN 100 MG PO CAPS: 100.0000 mg | ORAL_CAPSULE | Freq: Three times a day (TID) | ORAL | 3 refills | Status: DC

## 2016-02-05 NOTE — Assessment & Plan Note (Signed)
In PT 

## 2016-02-05 NOTE — Progress Notes (Signed)
Subjective:  Patient ID: Brittany [name], female    DOB: 15-Jul-1945  Age: 70 y.o. MRN: [id number]  CC: No chief complaint on file.   HPI Brittany [name] presents for a [facility] ER visit on 11/26: MI, PE ruled out. She stopped Celebrex (was taking 2/d) S/p R TKR in 10/17: she has been on Celebrex  Outpatient Medications Prior to Visit  Medication Sig Dispense Refill  . BIOTIN PO Take 1 tablet by mouth daily.     . calcium carbonate (OSCAL) 1500 (600 Ca) MG TABS tablet Take 600 mg of elemental calcium by mouth 2 (two) times daily with a meal.    . Cholecalciferol (VITAMIN D-3 PO) Take 1 tablet by mouth daily.     . citalopram (CELEXA) 40 MG tablet TAKE 1 TABLET (40 MG TOTAL) BY MOUTH DAILY. 90 tablet 1  . denosumab (PROLIA) 60 MG/ML SOLN injection Inject 60 mg into the skin once. Administer in upper arm, thigh, or abdomen (Patient taking differently: Inject 60 mg into the skin every 6 (six) months. Administer in upper arm, thigh, or abdomen) 1.8 mL 1  . diphenhydrAMINE (BENADRYL) 25 MG tablet Take 25 mg by mouth every 6 (six) hours as needed for allergies.     [name] docusate sodium (COLACE) 100 MG capsule Take 1 capsule (100 mg total) by mouth 2 (two) times daily. 10 capsule 0  . ferrous sulfate 325 (65 FE) MG tablet Take 1 tablet (325 mg total) by mouth 3 (three) times daily after meals.  3  . HYDROcodone-acetaminophen (NORCO) 7.5-325 MG tablet Take 1-2 tablets by mouth every 4 (four) hours as needed for moderate pain. 60 tablet 0  . ketorolac (ACULAR) 0.4 % SOLN APPLY 1 DROP IN SURGICAL EYE FOUR TIMES A DAY STARTING 1 DAY BEFORE SURGERY  1  . lansoprazole (PREVACID) 15 MG capsule Take 1 capsule (15 mg total) by mouth daily at 12 noon. 14 capsule 0  . methocarbamol (ROBAXIN) 500 MG tablet Take 1 tablet (500 mg total) by mouth every 6 (six) hours as needed for muscle spasms. 40 tablet 0  . ofloxacin (OCUFLOX) 0.3 % ophthalmic solution APPLY 1 DROP IN SURGICAL EYE FOUR TIMES A DAY STARTING 1 DAY BEFORE  SURGERY  1  . Omega-3 Fatty Acids (FISH OIL) 1000 MG CAPS Take 1 capsule by mouth daily.      . ondansetron (ZOFRAN) 4 MG tablet Take 1 tablet (4 mg total) by mouth every 6 (six) hours as needed for nausea or vomiting. 30 tablet 0  . oxyCODONE (OXY IR/ROXICODONE) 5 MG immediate release tablet TAKE 1-2 TABS BY MOUTH EVERY 6HRS AS NEEDED FOR PAIN  0  . polyethylene glycol (MIRALAX / GLYCOLAX) packet Take 17 g by mouth 2 (two) times daily. 14 each 0  . pravastatin (PRAVACHOL) 20 MG tablet Take 1 tablet (20 mg total) by mouth daily. (Patient taking differently: Take 20 mg by mouth at bedtime. ) 90 tablet 3  . prednisoLONE acetate (PRED FORTE) 1 % ophthalmic suspension APPLY 1 DROP IN SURGICAL EYE FOUR TIMES A DAY AFTER SURGERY  1  . ranitidine (ZANTAC) 150 MG tablet Take 1 tablet (150 mg total) by mouth 2 (two) times daily. 180 tablet 3  . SUMAtriptan (IMITREX) 100 MG tablet Take 1 tablet (100 mg total) by mouth once. May repeat in 2 hours if headache persists or recurs. 9 tablet 3  . traZODone (DESYREL) 50 MG tablet Take 0.5-1 tablets (25-50 mg total) by mouth at bedtime as  needed for sleep. 30 tablet 3  . vitamin B-12 (CYANOCOBALAMIN) 1000 MCG tablet Take 1,000 mcg by mouth daily.    [name] zolpidem (AMBIEN) 10 MG tablet TAKE 1 TABLET BY MOUTH AT BEDTIME AS NEEDED FOR SLEEP 90 tablet 1   No facility-administered medications prior to visit.     ROS Review of Systems  Constitutional: Negative for activity change, appetite change, chills, fatigue and unexpected weight change.  HENT: Negative for congestion, mouth sores and sinus pressure.   Eyes: Negative for visual disturbance.  Respiratory: Negative for cough and chest tightness.   Cardiovascular: Positive for chest pain.  Gastrointestinal: Negative for abdominal pain and nausea.  Genitourinary: Negative for difficulty urinating, frequency and vaginal pain.  Musculoskeletal: Positive for gait problem. Negative for back pain.  Skin: Negative for  pallor and rash.  Neurological: Negative for dizziness, tremors, weakness, numbness and headaches.  Psychiatric/Behavioral: Negative for confusion and sleep disturbance.    Objective:  BP 120/78   Pulse 83   Wt 123 lb (55.8 kg)   LMP 03/02/1994 (Approximate)   SpO2 97%   BMI 19.26 kg/m   BP Readings from Last 3 Encounters:  02/05/16 120/78  01/29/16 111/92  01/01/16 (!) 104/50    Wt Readings from Last 3 Encounters:  02/05/16 123 lb (55.8 kg)  12/31/15 129 lb (58.5 kg)  12/26/15 129 lb 2 oz (58.6 kg)    Physical Exam  Constitutional: She appears well-developed. No distress.  HENT:  Head: Normocephalic.  Right Ear: External ear normal.  Left Ear: External ear normal.  Nose: Nose normal.  Mouth/Throat: Oropharynx is clear and moist.  Eyes: Conjunctivae are normal. Pupils are equal, round, and reactive to light. Right eye exhibits no discharge. Left eye exhibits no discharge.  Neck: Normal range of motion. Neck supple. No JVD present. No tracheal deviation present. No thyromegaly present.  Cardiovascular: Normal rate, regular rhythm and normal heart sounds.   Pulmonary/Chest: No stridor. No respiratory distress. She has no wheezes.  Abdominal: Soft. Bowel sounds are normal. She exhibits no distension and no mass. There is no tenderness. There is no rebound and no guarding.  Musculoskeletal: She exhibits tenderness. She exhibits no edema.  Lymphadenopathy:    She has no cervical adenopathy.  Neurological: She displays normal reflexes. No cranial nerve deficit. She exhibits normal muscle tone. Coordination normal.  Skin: No rash noted. No erythema.  Psychiatric: She has a normal mood and affect. Her behavior is normal. Judgment and thought content normal.  R knee is tender [place]  Lab Results  Component Value Date   WBC 6.8 01/29/2016   HGB 11.8 (L) 01/29/2016   HCT 37.5 01/29/2016   PLT 430 (H) 01/29/2016   GLUCOSE 97 01/29/2016   CHOL 163 07/31/2015   TRIG 70.0  07/31/2015   HDL 63.30 07/31/2015   LDLDIRECT 178.9 03/23/2013   LDLCALC 85 07/31/2015   ALT 12 08/14/2015   AST 17 08/14/2015   NA 137 01/29/2016   K 4.7 01/29/2016   CL 103 01/29/2016   CREATININE 0.66 01/29/2016   BUN 11 01/29/2016   CO2 24 01/29/2016   TSH 1.41 03/23/2013    Dg Chest 2 View  Result Date: 01/29/2016 CLINICAL DATA:  Chest pain EXAM: CHEST  2 VIEW COMPARISON:  12/11/2013 FINDINGS: Normal heart size. No pleural effusion or edema. The lungs are hyperinflated but clear. Coarsened interstitial markings are identified bilaterally. No superimposed airspace consolidation. IMPRESSION: 1. No acute cardiopulmonary abnormalities. 2. Coarsened interstitial markings and hyperinflation suggestive  of COPD. Electronically Signed   By: Kerby Moors M.D.   On: 01/29/2016 12:11   Ct Angio Chest Pe W/cm &/or Wo Cm  Result Date: 01/29/2016 CLINICAL DATA:  Chest pain and difficulty breathing. Recent knee replacement EXAM: CT ANGIOGRAPHY CHEST WITH CONTRAST TECHNIQUE: Multidetector CT imaging of the chest was performed using the standard protocol during bolus administration of intravenous contrast. Multiplanar CT image reconstructions and MIPs were obtained to evaluate the vascular anatomy. CONTRAST:  100 mL Isovue 370 nonionic COMPARISON:  Chest CT December 16, 2007; chest radiograph January 29, 2016 FINDINGS: Cardiovascular: There is no demonstrable pulmonary embolus. There is no appreciable thoracic aortic aneurysm or dissection. The visualized great vessels appear unremarkable. Note that the right and left common carotid arteries arise as a common trunk, an anatomic variant. Pericardium is not appreciably thickened. Mediastinum/Nodes: Subcentimeter nodular opacities are noted in the thyroid consistent with multinodular goiter. No dominant thyroid mass is appreciable. There is no appreciable thoracic adenopathy. There is a small hiatal hernia. Lungs/Pleura: There is scarring in the extreme  apices bilaterally. On axial slice 70 series [id number], there is a nodular opacity in the superior segment of the right lower lobe measuring 6 mm, unchanged from the 2009 study. There is no edema or consolidation. No pleural effusion or pleural thickening evident. Upper Abdomen: In the visualized upper abdomen, no lesions are apparent. Musculoskeletal: There are no blastic or lytic bone lesions. No chest Raczkowski lesions appreciable. Review of the MIP images confirms the above findings. IMPRESSION: No demonstrable pulmonary embolus. No lung edema or consolidation. Stable 6 mm nodular opacity in the right lower lobe. Stability since 2009 is indicative of benign etiology. No adenopathy. Small nodular opacities in the thyroid consistent with multinodular goiter without dominant mass evident. Small hiatal hernia present. Electronically Signed   By: Lowella Grip III M.D.   On: 01/29/2016 14:57    Assessment & Plan:   There are no diagnoses linked to this encounter. I am having Brittany [name] maintain her Cholecalciferol (VITAMIN D-3 PO), Fish Oil, BIOTIN PO, diphenhydrAMINE, denosumab, SUMAtriptan, pravastatin, citalopram, ranitidine, ketorolac, ofloxacin, prednisoLONE acetate, vitamin B-12, calcium carbonate, traZODone, docusate sodium, HYDROcodone-acetaminophen, ferrous sulfate, methocarbamol, polyethylene glycol, ondansetron, zolpidem, lansoprazole, and oxyCODONE.  No orders of the defined types were placed in this encounter.    Follow-up: No Follow-up on file.  Walker Kehr, MD

## 2016-02-05 NOTE — Assessment & Plan Note (Addendum)
CP ER visit on 11/26: MI, PE ruled out. She stopped Celebrex (was taking 2/d). HH On Zantac

## 2016-02-05 NOTE — Assessment & Plan Note (Signed)
On B12 

## 2016-02-05 NOTE — Progress Notes (Signed)
Pre visit review using our clinic review tool, if applicable. No additional management support is needed unless otherwise documented below in the visit note. 

## 2016-02-05 NOTE — Assessment & Plan Note (Signed)
On Zantac 

## 2016-02-05 NOTE — Assessment & Plan Note (Signed)
Thyroid US

## 2016-02-06 DIAGNOSIS — M25561 Pain in right knee: Secondary | ICD-10-CM | POA: Diagnosis not present

## 2016-02-11 DIAGNOSIS — M25561 Pain in right knee: Secondary | ICD-10-CM | POA: Diagnosis not present

## 2016-02-13 DIAGNOSIS — Z96651 Presence of right artificial knee joint: Secondary | ICD-10-CM | POA: Diagnosis not present

## 2016-02-13 DIAGNOSIS — M25561 Pain in right knee: Secondary | ICD-10-CM | POA: Diagnosis not present

## 2016-02-14 ENCOUNTER — Other Ambulatory Visit: Payer: Self-pay

## 2016-02-14 DIAGNOSIS — C50911 Malignant neoplasm of unspecified site of right female breast: Secondary | ICD-10-CM

## 2016-02-17 ENCOUNTER — Other Ambulatory Visit (HOSPITAL_BASED_OUTPATIENT_CLINIC_OR_DEPARTMENT_OTHER): Payer: PPO

## 2016-02-17 ENCOUNTER — Ambulatory Visit (HOSPITAL_BASED_OUTPATIENT_CLINIC_OR_DEPARTMENT_OTHER): Payer: PPO

## 2016-02-17 VITALS — BP 110/77 | HR 72 | Temp 98.1°F | Resp 18

## 2016-02-17 DIAGNOSIS — C50911 Malignant neoplasm of unspecified site of right female breast: Secondary | ICD-10-CM

## 2016-02-17 DIAGNOSIS — M810 Age-related osteoporosis without current pathological fracture: Secondary | ICD-10-CM | POA: Diagnosis not present

## 2016-02-17 LAB — COMPREHENSIVE METABOLIC PANEL
ALT: 11 U/L (ref 0–55)
AST: 18 U/L (ref 5–34)
Albumin: 3.7 g/dL (ref 3.5–5.0)
Alkaline Phosphatase: 75 U/L (ref 40–150)
Anion Gap: 8 mEq/L (ref 3–11)
BILIRUBIN TOTAL: 0.28 mg/dL (ref 0.20–1.20)
BUN: 11.2 mg/dL (ref 7.0–26.0)
CHLORIDE: 102 meq/L (ref 98–109)
CO2: 29 meq/L (ref 22–29)
CREATININE: 0.7 mg/dL (ref 0.6–1.1)
Calcium: 9.9 mg/dL (ref 8.4–10.4)
EGFR: 86 mL/min/{1.73_m2} — ABNORMAL LOW (ref >90)
GLUCOSE: 107 mg/dL (ref 70–140)
Potassium: 3.7 mEq/L (ref 3.5–5.1)
Sodium: 139 mEq/L (ref 136–145)
TOTAL PROTEIN: 7.3 g/dL (ref 6.4–8.3)

## 2016-02-17 LAB — CBC WITH DIFFERENTIAL/PLATELET
BASO%: 0.5 % (ref 0.0–2.0)
Basophils Absolute: 0 10*3/uL (ref 0.0–0.1)
EOS%: 4 % (ref 0.0–7.0)
Eosinophils Absolute: 0.2 10*3/uL (ref 0.0–0.5)
HEMATOCRIT: 37.3 % (ref 34.8–46.6)
HGB: 11.7 g/dL (ref 11.6–15.9)
LYMPH#: 1.5 10*3/uL (ref 0.9–3.3)
LYMPH%: 26.4 % (ref 14.0–49.7)
MCH: 27.6 pg (ref 25.1–34.0)
MCHC: 31.4 g/dL — AB (ref 31.5–36.0)
MCV: 88 fL (ref 79.5–101.0)
MONO#: 0.4 10*3/uL (ref 0.1–0.9)
MONO%: 7.4 % (ref 0.0–14.0)
NEUT%: 61.7 % (ref 38.4–76.8)
NEUTROS ABS: 3.6 10*3/uL (ref 1.5–6.5)
Platelets: 338 10*3/uL (ref 145–400)
RBC: 4.24 10*6/uL (ref 3.70–5.45)
RDW: 16.2 % — ABNORMAL HIGH (ref 11.2–14.5)
WBC: 5.8 10*3/uL (ref 3.9–10.3)

## 2016-02-17 MED ORDER — DENOSUMAB 60 MG/ML ~~LOC~~ SOLN
60.0000 mg | Freq: Once | SUBCUTANEOUS | Status: AC
  Administered 2016-02-17: 60 mg via SUBCUTANEOUS
  Filled 2016-02-17: qty 1

## 2016-02-17 NOTE — Patient Instructions (Signed)
Denosumab injection  What is this medicine?  DENOSUMAB (den oh sue mab) slows bone breakdown. Prolia is used to treat osteoporosis in women after menopause and in men. Xgeva is used to prevent bone fractures and other bone problems caused by cancer bone metastases. Xgeva is also used to treat giant cell tumor of the bone.  This medicine may be used for other purposes; ask your health care provider or pharmacist if you have questions.  What should I tell my health care provider before I take this medicine?  They need to know if you have any of these conditions:  -dental disease  -eczema  -infection or history of infections  -kidney disease or on dialysis  -low blood calcium or vitamin D  -malabsorption syndrome  -scheduled to have surgery or tooth extraction  -taking medicine that contains denosumab  -thyroid or parathyroid disease  -an unusual reaction to denosumab, other medicines, foods, dyes, or preservatives  -pregnant or trying to get pregnant  -breast-feeding  How should I use this medicine?  This medicine is for injection under the skin. It is given by a health care professional in a hospital or clinic setting.  If you are getting Prolia, a special MedGuide will be given to you by the pharmacist with each prescription and refill. Be sure to read this information carefully each time.  For Prolia, talk to your pediatrician regarding the use of this medicine in children. Special care may be needed. For Xgeva, talk to your pediatrician regarding the use of this medicine in children. While this drug may be prescribed for children as young as 13 years for selected conditions, precautions do apply.  Overdosage: If you think you have taken too much of this medicine contact a poison control center or emergency room at once.  NOTE: This medicine is only for you. Do not share this medicine with others.  What if I miss a dose?  It is important not to miss your dose. Call your doctor or health care professional if you are  unable to keep an appointment.  What may interact with this medicine?  Do not take this medicine with any of the following medications:  -other medicines containing denosumab  This medicine may also interact with the following medications:  -medicines that suppress the immune system  -medicines that treat cancer  -steroid medicines like prednisone or cortisone  This list may not describe all possible interactions. Give your health care provider a list of all the medicines, herbs, non-prescription drugs, or dietary supplements you use. Also tell them if you smoke, drink alcohol, or use illegal drugs. Some items may interact with your medicine.  What should I watch for while using this medicine?  Visit your doctor or health care professional for regular checks on your progress. Your doctor or health care professional may order blood tests and other tests to see how you are doing.  Call your doctor or health care professional if you get a cold or other infection while receiving this medicine. Do not treat yourself. This medicine may decrease your body's ability to fight infection.  You should make sure you get enough calcium and vitamin D while you are taking this medicine, unless your doctor tells you not to. Discuss the foods you eat and the vitamins you take with your health care professional.  See your dentist regularly. Brush and floss your teeth as directed. Before you have any dental work done, tell your dentist you are receiving this medicine.  Do   not become pregnant while taking this medicine or for 5 months after stopping it. Women should inform their doctor if they wish to become pregnant or think they might be pregnant. There is a potential for serious side effects to an unborn child. Talk to your health care professional or pharmacist for more information.  What side effects may I notice from receiving this medicine?  Side effects that you should report to your doctor or health care professional as soon as  possible:  -allergic reactions like skin rash, itching or hives, swelling of the face, lips, or tongue  -breathing problems  -chest pain  -fast, irregular heartbeat  -feeling faint or lightheaded, falls  -fever, chills, or any other sign of infection  -muscle spasms, tightening, or twitches  -numbness or tingling  -skin blisters or bumps, or is dry, peels, or red  -slow healing or unexplained pain in the mouth or jaw  -unusual bleeding or bruising  Side effects that usually do not require medical attention (Report these to your doctor or health care professional if they continue or are bothersome.):  -muscle pain  -stomach upset, gas  This list may not describe all possible side effects. Call your doctor for medical advice about side effects. You may report side effects to FDA at 1-800-FDA-1088.  Where should I keep my medicine?  This medicine is only given in a clinic, doctor's office, or other health care setting and will not be stored at home.  NOTE: This sheet is a summary. It may not cover all possible information. If you have questions about this medicine, talk to your doctor, pharmacist, or health care provider.      2016, Elsevier/[employer] Standard. (2011-08-17 12:37:47)

## 2016-02-18 DIAGNOSIS — M25561 Pain in right knee: Secondary | ICD-10-CM | POA: Diagnosis not present

## 2016-02-20 DIAGNOSIS — M25561 Pain in right knee: Secondary | ICD-10-CM | POA: Diagnosis not present

## 2016-02-26 DIAGNOSIS — M25561 Pain in right knee: Secondary | ICD-10-CM | POA: Diagnosis not present

## 2016-03-03 DIAGNOSIS — M25561 Pain in right knee: Secondary | ICD-10-CM | POA: Diagnosis not present

## 2016-03-05 DIAGNOSIS — M25561 Pain in right knee: Secondary | ICD-10-CM | POA: Diagnosis not present

## 2016-03-09 ENCOUNTER — Telehealth: Payer: Self-pay | Admitting: Internal Medicine

## 2016-03-09 NOTE — Telephone Encounter (Signed)
Pt is requesting Tamiflu. She visited her mother yesterday and there are several cases of the flu where she lives. Nurse called her last night to let her know her mother was experiencing flu symptoms and they went ahead and gave her mother Tamiflu.  Her son's wedding is this weekend in [place] and she is very worried she may get sick.  Her pharmacy is [facility] on [address]

## 2016-03-10 MED ORDER — OSELTAMIVIR PHOSPHATE 75 MG PO CAPS
75.0000 mg | ORAL_CAPSULE | Freq: Every day | ORAL | 0 refills | Status: DC
Start: 2016-03-10 — End: 2016-07-21

## 2016-03-10 NOTE — Telephone Encounter (Signed)
Ok - Rx emailed Thx 

## 2016-03-10 NOTE — Telephone Encounter (Signed)
Notified pt MD [facility] to [facility].../lmb

## 2016-03-28 ENCOUNTER — Other Ambulatory Visit: Payer: Self-pay | Admitting: Internal Medicine

## 2016-04-04 NOTE — Progress Notes (Signed)
[name] [address] [place], [place] [address] Phone: [phone] Subjective:     CC: right knee pain f/u  [id number]  Brittany [name] is a 71 y.o. female coming in with complaint of Right knee pain. Patient was found to have a Baker cyst. Patient has not been seen for greater than 5 months. Has responded well to aspiration previously. Patient states was given replacement. Still having pain.   Patient is now complaining of more left-sided knee pain. Has assistant swelling on the posterior aspect. Feels like she likely has a cyst. Patient states that the pain is not as severe as the right side was. States that she is not having any significant instability. States though that is uncomfortable. Constant avoid any type of surgical intervention with her not having great results with the right knee.   Past Medical History:  Diagnosis Date  . Acid reflux   . Arthritis   . Breast cancer ([place]) 11/23/2007   right, ER/PR -, HER 2 -  . Cancer [facility])    breast right  triple recet neg, lumpect, chemo, XRT  . Esophageal stricture   . Gastritis 10/2007  . History of chemotherapy    neoadjuvant chemo, last dose 03/2008  . History of radiation therapy 07/25/08 -09/11/08   right breast  . Hyperlipidemia   . Migraines    rare  . Osteopenia    Past Surgical History:  Procedure Laterality Date  . BREAST LUMPECTOMY  06/26/2008   right, CA   XRT, chemo  . EYE SURGERY     left eye cataract surgery 12/23/2015  . growth removed per right thigh    . JOINT REPLACEMENT  12/31/2015   RTK  . TOTAL KNEE ARTHROPLASTY Right 12/31/2015   Procedure: RIGHT TOTAL KNEE ARTHROPLASTY;  Surgeon: [name], MD;  Location: [facility];  Service: Orthopedics;  Laterality: Right;   Social History   Social History  . Marital status: Married    Spouse name: N/A  . Number of children: 3  . Years of education: N/A   Occupational History  .  Retired   Social History Main Topics  .  Smoking status: Former Smoker    Packs/day: 0.25    Years: 5.00    Types: Cigarettes    Quit date: 03/03/1967  . Smokeless tobacco: Never Used     Comment: 6 cig per day when she was smoking/quit years ago  . Alcohol use 0.0 oz/week     Comment: 1 beer per day   . Drug use: No  . Sexual activity: Yes    Partners: Male    Birth control/ protection: Post-menopausal   Other Topics Concern  . None   Social History Narrative   G3, P3, 1st pregnancy age 59, menopause age 5, no HRT   Allergies  Allergen Reactions  . Boniva [Ibandronic Acid] Nausea Only  . Lipitor [Atorvastatin]     arthralgias   Family History  Problem Relation Age of Onset  . Heart disease Father   . Cancer Father     lung  . Cancer Sister 52    Breast & Colorectal  . Epilepsy Mother   . Dementia Mother     Past medical history, social, surgical and family history all reviewed in electronic medical record.  No pertanent information unless stated regarding to the chief complaint.   Review of Systems: No headache, visual changes, nausea, vomiting, diarrhea, constipation, dizziness, abdominal pain, skin rash, fevers, chills,  night sweats, weight loss, swollen lymph nodes, body aches,  chest pain, shortness of breath, mood changes.    Objective  Blood pressure 124/72, pulse 88, weight 127 lb (57.6 kg), last menstrual period 03/02/1994, SpO2 97 %.  Systems examined below as of 04/06/16 General: NAD A&O x3 mood, affect normal  HEENT: Pupils equal, extraocular movements intact no nystagmus Respiratory: not short of breath at rest or with speaking Cardiovascular: No lower extremity edema, non tender Skin: Warm dry intact with no signs of infection or rash on extremities or on axial skeleton. Abdomen: Soft nontender, no masses Neuro: Cranial nerves  intact, neurovascularly intact in all extremities with 2+ DTRs and 2+ pulses. Lymph: No lymphadenopathy appreciated today  Gait normal with good balance and  coordination.  MSK: Non tender with full range of motion and good stability and symmetric strength and tone of shoulders, elbows, wrist,  hips and ankles bilaterally.   Knee: Left valgus deformity noted. Large thigh to calf ratio.  Tender to palpation over medial and PF joint line.  ROM full in flexion and extension and lower leg rotation. instability with valgus force.  painful patellar compression. Patellar glide with moderate crepitus. Patellar and quadriceps tendons unremarkable. Hamstring and quadriceps strength is normal. Contralateral knee shows recent replacement. Patient still has some very mild instability of feels. Patient is also lacking the last 15 of flexion of the last 5 of extension. Trace swelling noted  Procedure: Real-time Ultrasound Guided Injection of left knee aspiration of Baker cyst Device: GE Logiq Q7 Ultrasound guided injection is preferred based studies that show increased duration, increased effect, greater accuracy, decreased procedural pain, increased response rate, and decreased cost with ultrasound guided versus blind injection.  Verbal informed consent obtained.  Time-out conducted.  Noted no overlying erythema, induration, or other signs of local infection.  Skin prepped in a sterile fashion.  Local anesthesia: Topical Ethyl chloride.  With sterile technique and under real time ultrasound guidance: With 18-gauge 1-1/2 inch needle patient did have aspiration of 18 mL of strawlike colored fluid then injected 40 mL of Kenalog 40 mg/dL into the sac. Completed without difficulty  Pain immediately resolved suggesting accurate placement of the medication.  Advised to call if fevers/chills, erythema, induration, drainage, or persistent bleeding.  Images permanently stored and available for review in the ultrasound unit.  Impression: Technically successful ultrasound guided injection.   Impression and Recommendations:     This case required medical decision  making of moderate complexity.      Note: This dictation was prepared with Dragon dictation along with smaller phrase technology. Any transcriptional errors that result from this process are unintentional.

## 2016-04-06 ENCOUNTER — Ambulatory Visit: Payer: Self-pay

## 2016-04-06 ENCOUNTER — Encounter: Payer: Self-pay | Admitting: Family Medicine

## 2016-04-06 ENCOUNTER — Ambulatory Visit (INDEPENDENT_AMBULATORY_CARE_PROVIDER_SITE_OTHER): Payer: PPO | Admitting: Family Medicine

## 2016-04-06 VITALS — BP 124/72 | HR 88 | Wt 127.0 lb

## 2016-04-06 DIAGNOSIS — M1712 Unilateral primary osteoarthritis, left knee: Secondary | ICD-10-CM | POA: Insufficient documentation

## 2016-04-06 DIAGNOSIS — M7122 Synovial cyst of popliteal space [Baker], left knee: Secondary | ICD-10-CM

## 2016-04-06 DIAGNOSIS — Z96651 Presence of right artificial knee joint: Secondary | ICD-10-CM | POA: Diagnosis not present

## 2016-04-06 DIAGNOSIS — M25562 Pain in left knee: Secondary | ICD-10-CM

## 2016-04-06 DIAGNOSIS — T8484XA Pain due to internal orthopedic prosthetic devices, implants and grafts, initial encounter: Secondary | ICD-10-CM | POA: Diagnosis not present

## 2016-04-06 HISTORY — DX: Unilateral primary osteoarthritis, left knee: M17.12

## 2016-04-06 HISTORY — DX: Synovial cyst of popliteal space (Baker), left knee: M71.22

## 2016-04-06 NOTE — Assessment & Plan Note (Signed)
New problem. Patient did have aspiration of the Baker cyst today. Injection was given as well. Discussed icing regimen. Discussed possible getting custom brace. Patient would be a candidate for viscous supplementation if needed. Follow-up again in 4 weeks.

## 2016-04-06 NOTE — Patient Instructions (Addendum)
Good to see you  The right knee will get there  Consider zyrtec 10mg  at night to see if this is an allergy Drained the baker cyst.   I think it will last quite a while I hope.  I do think you still have some miles to go on that left knee which is good.  You are doing everything right See me again in 4 weeks.  Can repeat aspiration every 10 weeks.

## 2016-04-06 NOTE — Assessment & Plan Note (Signed)
4 months since replacement. Discussed with patient about increasing range of motion. Has finished with formal physical therapy. Discussed the possibility of some allergic reaction causing still continued swelling. Also discussed that another 6 months is not abnormal for an decreasing range of motion. Follow-up again in 4 weeks

## 2016-04-26 ENCOUNTER — Other Ambulatory Visit: Payer: Self-pay | Admitting: Internal Medicine

## 2016-05-13 DIAGNOSIS — Z1231 Encounter for screening mammogram for malignant neoplasm of breast: Secondary | ICD-10-CM | POA: Diagnosis not present

## 2016-05-13 DIAGNOSIS — Z853 Personal history of malignant neoplasm of breast: Secondary | ICD-10-CM | POA: Diagnosis not present

## 2016-05-13 LAB — HM MAMMOGRAPHY

## 2016-05-18 ENCOUNTER — Encounter: Payer: Self-pay | Admitting: Internal Medicine

## 2016-05-19 ENCOUNTER — Encounter: Payer: Self-pay | Admitting: Internal Medicine

## 2016-05-25 ENCOUNTER — Telehealth: Payer: Self-pay | Admitting: Internal Medicine

## 2016-05-25 NOTE — Telephone Encounter (Signed)
Patient states that she transferred care to [facility] because her insurance covered her procedure  there. Patient states that she now has insurance that our office accepts and is requesting to see Dr. Carlean Purl again. Records placed on Dr. Celesta Aver desk for review.

## 2016-05-27 NOTE — Telephone Encounter (Signed)
Dr. Carlean Purl reviewed records and has accepted patient to schedule colon. Message left for patient to call back and records in review folder

## 2016-05-28 ENCOUNTER — Encounter: Payer: Self-pay | Admitting: Internal Medicine

## 2016-06-23 ENCOUNTER — Other Ambulatory Visit: Payer: Self-pay | Admitting: Internal Medicine

## 2016-06-23 NOTE — Telephone Encounter (Signed)
OV q 6 mo 

## 2016-07-12 ENCOUNTER — Encounter: Payer: Self-pay | Admitting: Internal Medicine

## 2016-07-21 ENCOUNTER — Encounter: Payer: Self-pay | Admitting: Internal Medicine

## 2016-07-21 ENCOUNTER — Ambulatory Visit (AMBULATORY_SURGERY_CENTER): Payer: Self-pay

## 2016-07-21 ENCOUNTER — Other Ambulatory Visit: Payer: Self-pay | Admitting: *Deleted

## 2016-07-21 VITALS — Ht 66.5 in | Wt 124.4 lb

## 2016-07-21 DIAGNOSIS — Z800 Family history of malignant neoplasm of digestive organs: Secondary | ICD-10-CM

## 2016-07-21 MED ORDER — TRAZODONE HCL 50 MG PO TABS: 25.0000 mg | ORAL_TABLET | Freq: Every evening | ORAL | 0 refills | Status: DC | PRN

## 2016-07-21 NOTE — Progress Notes (Signed)
No allergies to eggs or soy No past problems with anesthesia No diet med No home xoygen  Registered emmi

## 2016-07-28 ENCOUNTER — Encounter: Payer: Self-pay | Admitting: Internal Medicine

## 2016-07-28 ENCOUNTER — Other Ambulatory Visit (INDEPENDENT_AMBULATORY_CARE_PROVIDER_SITE_OTHER): Payer: PPO

## 2016-07-28 ENCOUNTER — Ambulatory Visit (INDEPENDENT_AMBULATORY_CARE_PROVIDER_SITE_OTHER): Payer: PPO | Admitting: Internal Medicine

## 2016-07-28 DIAGNOSIS — D509 Iron deficiency anemia, unspecified: Secondary | ICD-10-CM | POA: Insufficient documentation

## 2016-07-28 DIAGNOSIS — E538 Deficiency of other specified B group vitamins: Secondary | ICD-10-CM | POA: Diagnosis not present

## 2016-07-28 DIAGNOSIS — F3342 Major depressive disorder, recurrent, in full remission: Secondary | ICD-10-CM

## 2016-07-28 DIAGNOSIS — D508 Other iron deficiency anemias: Secondary | ICD-10-CM | POA: Diagnosis not present

## 2016-07-28 DIAGNOSIS — Z52008 Unspecified donor, other blood: Secondary | ICD-10-CM | POA: Insufficient documentation

## 2016-07-28 DIAGNOSIS — H6123 Impacted cerumen, bilateral: Secondary | ICD-10-CM | POA: Diagnosis not present

## 2016-07-28 DIAGNOSIS — E785 Hyperlipidemia, unspecified: Secondary | ICD-10-CM

## 2016-07-28 LAB — CBC WITH DIFFERENTIAL/PLATELET
BASOS PCT: 1 % (ref 0.0–3.0)
Basophils Absolute: 0.1 10*3/uL (ref 0.0–0.1)
Eosinophils Absolute: 0.3 10*3/uL (ref 0.0–0.7)
Eosinophils Relative: 5.1 % — ABNORMAL HIGH (ref 0.0–5.0)
HCT: 35.9 % — ABNORMAL LOW (ref 36.0–46.0)
Hemoglobin: 11.7 g/dL — ABNORMAL LOW (ref 12.0–15.0)
Lymphocytes Relative: 27.3 % (ref 12.0–46.0)
Lymphs Abs: 1.8 10*3/uL (ref 0.7–4.0)
MCHC: 32.5 g/dL (ref 30.0–36.0)
MCV: 83.9 fl (ref 78.0–100.0)
MONO ABS: 0.7 10*3/uL (ref 0.1–1.0)
Monocytes Relative: 9.8 % (ref 3.0–12.0)
NEUTROS ABS: 3.8 10*3/uL (ref 1.4–7.7)
Neutrophils Relative %: 56.8 % (ref 43.0–77.0)
PLATELETS: 354 10*3/uL (ref 150.0–400.0)
RBC: 4.27 Mil/uL (ref 3.87–5.11)
RDW: 17.3 % — ABNORMAL HIGH (ref 11.5–15.5)
WBC: 6.8 10*3/uL (ref 4.0–10.5)

## 2016-07-28 LAB — BASIC METABOLIC PANEL
BUN: 13 mg/dL (ref 6–23)
CALCIUM: 9.7 mg/dL (ref 8.4–10.5)
CO2: 29 mEq/L (ref 19–32)
CREATININE: 0.7 mg/dL (ref 0.40–1.20)
Chloride: 102 mEq/L (ref 96–112)
GFR: 88.21 mL/min (ref >60.00)
GLUCOSE: 85 mg/dL (ref 70–99)
Potassium: 4.3 mEq/L (ref 3.5–5.1)
Sodium: 137 mEq/L (ref 135–145)

## 2016-07-28 NOTE — Assessment & Plan Note (Signed)
See procedure 

## 2016-07-28 NOTE — Assessment & Plan Note (Signed)
Anemic - 2017-18 Labs

## 2016-07-28 NOTE — Assessment & Plan Note (Signed)
labs

## 2016-07-28 NOTE — Progress Notes (Signed)
Subjective:  Patient ID: Brittany [name], female    DOB: 1945/09/14  Age: 71 y.o. MRN: [id number]  CC: No chief complaint on file.   HPI Brittany [name] presents for low iron: she gave blood 3 times at [facility] - low Hgb Taking OTC Iron starting 2017 F/u depression; dyslipidemia f/u. Pt stopped Pravastatin  Outpatient Medications Prior to Visit  Medication Sig Dispense Refill  . aspirin EC 81 MG tablet Take 81 mg by mouth daily.    [name] BIOTIN PO Take 1 tablet by mouth daily.     . calcium carbonate (OSCAL) 1500 (600 Ca) MG TABS tablet Take 600 mg of elemental calcium by mouth 2 (two) times daily with a meal.    . Cholecalciferol (VITAMIN D-3 PO) Take 1 tablet by mouth daily.     . citalopram (CELEXA) 40 MG tablet TAKE ONE TABLET BY MOUTH DAILY. 90 tablet 1  . Cyanocobalamin (VITAMIN B-12 PO) Take 2,500 mcg by mouth daily.     [name] denosumab (PROLIA) 60 MG/ML SOLN injection Inject 60 mg into the skin once. Administer in upper arm, thigh, or abdomen (Patient taking differently: Inject 60 mg into the skin every 6 (six) months. Administer in upper arm, thigh, or abdomen) 1.8 mL 1  . diphenhydrAMINE (BENADRYL) 25 MG tablet Take 25 mg by mouth every 6 (six) hours as needed for allergies.     . ferrous sulfate 325 (65 FE) MG tablet Take 1 tablet (325 mg total) by mouth 3 (three) times daily after meals.  3  . Omega-3 Fatty Acids (FISH OIL) 1200 MG CAPS Take 1 capsule by mouth daily.      . ranitidine (ZANTAC) 150 MG tablet Take 1 tablet (150 mg total) by mouth 2 (two) times daily. 180 tablet 3  . SUMAtriptan (IMITREX) 100 MG tablet Take 1 tablet (100 mg total) by mouth once. May repeat in 2 hours if headache persists or recurs. 9 tablet 3  . traZODone (DESYREL) 50 MG tablet Take 0.5-1 tablets (25-50 mg total) by mouth at bedtime as needed. for sleep Follow-up appt is due must see Md for refills 30 tablet 0  . zolpidem (AMBIEN) 10 MG tablet TAKE 1 TABLET BY MOUTH AT BEDTIME AS NEEDED FOR SLEEP 90 tablet 1    . docusate sodium (COLACE) 100 MG capsule Take 1 capsule (100 mg total) by mouth 2 (two) times daily. 10 capsule 0   No facility-administered medications prior to visit.     ROS Review of Systems  Constitutional: Negative for activity change, appetite change, chills, fatigue and unexpected weight change.  HENT: Positive for hearing loss. Negative for congestion, mouth sores and sinus pressure.   Eyes: Negative for visual disturbance.  Respiratory: Negative for cough and chest tightness.   Gastrointestinal: Negative for abdominal pain and nausea.  Genitourinary: Negative for difficulty urinating, frequency and vaginal pain.  Musculoskeletal: Negative for back pain and gait problem.  Skin: Negative for pallor and rash.  Neurological: Negative for dizziness, tremors, weakness, numbness and headaches.  Psychiatric/Behavioral: Negative for confusion, sleep disturbance and suicidal ideas. The patient is not nervous/anxious.     Objective:  BP 116/74 (BP Location: Left Arm, Patient Position: Sitting, Cuff Size: Normal)   Pulse 61   Temp 97.8 F (36.6 C) (Oral)   Ht 5' 6.5" (1.689 m)   Wt 125 lb (56.7 kg)   LMP 03/02/1994 (Approximate)   SpO2 99%   BMI 19.87 kg/m   BP Readings from Last 3  Encounters:  07/28/16 116/74  04/06/16 124/72  02/17/16 110/77    Wt Readings from Last 3 Encounters:  07/28/16 125 lb (56.7 kg)  07/21/16 124 lb 6.4 oz (56.4 kg)  04/06/16 127 lb (57.6 kg)    Physical Exam  Constitutional: She appears well-developed. No distress.  HENT:  Head: Normocephalic.  Right Ear: External ear normal.  Left Ear: External ear normal.  Nose: Nose normal.  Mouth/Throat: Oropharynx is clear and moist.  Eyes: Conjunctivae are normal. Pupils are equal, round, and reactive to light. Right eye exhibits no discharge. Left eye exhibits no discharge.  Neck: Normal range of motion. Neck supple. No JVD present. No tracheal deviation present. No thyromegaly present.   Cardiovascular: Normal rate, regular rhythm and normal heart sounds.   Pulmonary/Chest: No stridor. No respiratory distress. She has no wheezes.  Abdominal: Soft. Bowel sounds are normal. She exhibits no distension and no mass. There is no tenderness. There is no rebound and no guarding.  Musculoskeletal: She exhibits no edema or tenderness.  Lymphadenopathy:    She has no cervical adenopathy.  Neurological: She displays normal reflexes. No cranial nerve deficit. She exhibits normal muscle tone. Coordination normal.  Skin: No rash noted. No erythema.  Psychiatric: She has a normal mood and affect. Her behavior is normal. Judgment and thought content normal.   B wax   Procedure Note :     Procedure :  Ear irrigation   Indication:  Cerumen impaction   Risks, including pain, dizziness, eardrum perforation, bleeding, infection and others as well as benefits were explained to the patient in detail. Verbal consent was obtained and the patient agreed to proceed.    We used "The Elephant Ear Irrigation Device" filled with lukewarm water for irrigation. A large amount wax was recovered. Procedure has also required manual wax removal with an ear loop.   Tolerated well. Complications: None.   Postprocedure instructions :  Call if problems.   Lab Results  Component Value Date   WBC 5.8 02/17/2016   HGB 11.7 02/17/2016   HCT 37.3 02/17/2016   PLT 338 02/17/2016   GLUCOSE 107 02/17/2016   CHOL 163 07/31/2015   TRIG 70.0 07/31/2015   HDL 63.30 07/31/2015   LDLDIRECT 178.9 03/23/2013   LDLCALC 85 07/31/2015   ALT 11 02/17/2016   AST 18 02/17/2016   NA 139 02/17/2016   K 3.7 02/17/2016   CL 103 01/29/2016   CREATININE 0.7 02/17/2016   BUN 11.2 02/17/2016   CO2 29 02/17/2016   TSH 1.41 03/23/2013    Dg Chest 2 View  Result Date: 01/29/2016 CLINICAL DATA:  Chest pain EXAM: CHEST  2 VIEW COMPARISON:  12/11/2013 FINDINGS: Normal heart size. No pleural effusion or edema. The lungs  are hyperinflated but clear. Coarsened interstitial markings are identified bilaterally. No superimposed airspace consolidation. IMPRESSION: 1. No acute cardiopulmonary abnormalities. 2. Coarsened interstitial markings and hyperinflation suggestive of COPD. Electronically Signed   By: Kerby Moors M.D.   On: 01/29/2016 12:11   Ct Angio Chest Pe W/cm &/or Wo Cm  Result Date: 01/29/2016 CLINICAL DATA:  Chest pain and difficulty breathing. Recent knee replacement EXAM: CT ANGIOGRAPHY CHEST WITH CONTRAST TECHNIQUE: Multidetector CT imaging of the chest was performed using the standard protocol during bolus administration of intravenous contrast. Multiplanar CT image reconstructions and MIPs were obtained to evaluate the vascular anatomy. CONTRAST:  100 mL Isovue 370 nonionic COMPARISON:  Chest CT December 16, 2007; chest radiograph January 29, 2016 FINDINGS: Cardiovascular: There  is no demonstrable pulmonary embolus. There is no appreciable thoracic aortic aneurysm or dissection. The visualized great vessels appear unremarkable. Note that the right and left common carotid arteries arise as a common trunk, an anatomic variant. Pericardium is not appreciably thickened. Mediastinum/Nodes: Subcentimeter nodular opacities are noted in the thyroid consistent with multinodular goiter. No dominant thyroid mass is appreciable. There is no appreciable thoracic adenopathy. There is a small hiatal hernia. Lungs/Pleura: There is scarring in the extreme apices bilaterally. On axial slice 70 series 110, there is a nodular opacity in the superior segment of the right lower lobe measuring 6 mm, unchanged from the 2009 study. There is no edema or consolidation. No pleural effusion or pleural thickening evident. Upper Abdomen: In the visualized upper abdomen, no lesions are apparent. Musculoskeletal: There are no blastic or lytic bone lesions. No chest Mcgaugh lesions appreciable. Review of the MIP images confirms the above findings.  IMPRESSION: No demonstrable pulmonary embolus. No lung edema or consolidation. Stable 6 mm nodular opacity in the right lower lobe. Stability since 2009 is indicative of benign etiology. No adenopathy. Small nodular opacities in the thyroid consistent with multinodular goiter without dominant mass evident. Small hiatal hernia present. Electronically Signed   By: Lowella Grip III M.D.   On: 01/29/2016 14:57    Assessment & Plan:   There are no diagnoses linked to this encounter. I have discontinued Ms. Kopp's docusate sodium. I am also having her maintain her Cholecalciferol (VITAMIN D-3 PO), Fish Oil, BIOTIN PO, diphenhydrAMINE, denosumab, SUMAtriptan, ranitidine, Cyanocobalamin (VITAMIN B-12 PO), calcium carbonate, ferrous sulfate, zolpidem, citalopram, aspirin EC, and traZODone.  No orders of the defined types were placed in this encounter.    Follow-up: No Follow-up on file.  Walker Kehr, MD

## 2016-07-28 NOTE — Assessment & Plan Note (Signed)
pravastatin - she stopped

## 2016-07-28 NOTE — Assessment & Plan Note (Signed)
Citalopram 

## 2016-07-28 NOTE — Assessment & Plan Note (Signed)
low iron: she gave blood 3 times at [facility] - low Hgb Taking OTC Iron starting 2017 Colon - next week Labs

## 2016-07-29 LAB — VITAMIN B12: Vitamin B-12: >1500 pg/mL — ABNORMAL HIGH (ref 211–911)

## 2016-07-31 ENCOUNTER — Encounter: Payer: Self-pay | Admitting: Internal Medicine

## 2016-08-04 ENCOUNTER — Ambulatory Visit (AMBULATORY_SURGERY_CENTER): Payer: PPO | Admitting: Internal Medicine

## 2016-08-04 ENCOUNTER — Encounter: Payer: Self-pay | Admitting: Internal Medicine

## 2016-08-04 VITALS — BP 135/73 | HR 83 | Temp 97.8°F | Resp 12 | Ht 66.5 in | Wt 124.0 lb

## 2016-08-04 DIAGNOSIS — Z1211 Encounter for screening for malignant neoplasm of colon: Secondary | ICD-10-CM | POA: Diagnosis not present

## 2016-08-04 DIAGNOSIS — Z1212 Encounter for screening for malignant neoplasm of rectum: Secondary | ICD-10-CM

## 2016-08-04 DIAGNOSIS — D123 Benign neoplasm of transverse colon: Secondary | ICD-10-CM

## 2016-08-04 DIAGNOSIS — Z800 Family history of malignant neoplasm of digestive organs: Secondary | ICD-10-CM

## 2016-08-04 DIAGNOSIS — K635 Polyp of colon: Secondary | ICD-10-CM | POA: Diagnosis not present

## 2016-08-04 DIAGNOSIS — D122 Benign neoplasm of ascending colon: Secondary | ICD-10-CM

## 2016-08-04 MED ORDER — SODIUM CHLORIDE 0.9 % IV SOLN: 500.0000 mL | INTRAVENOUS | Status: DC

## 2016-08-04 NOTE — Op Note (Signed)
[place] Patient Name: Brittany [name] Procedure Date: 08/04/2016 10:01 AM MRN: [id number] Endoscopist: Gatha Mayer , MD Age: 71 Referring MD:  Date of Birth: 24-Jul-1945 Gender: Female Account #: [id number] Procedure:                Colonoscopy Indications:              Screening in patient at increased risk: Colorectal                            cancer in sister before age 63 Medicines:                Propofol per Anesthesia, Monitored Anesthesia Care Procedure:                Pre-Anesthesia Assessment:                           - Prior to the procedure, a History and Physical                            was performed, and patient medications and                            allergies were reviewed. The patient's tolerance of                            previous anesthesia was also reviewed. The risks                            and benefits of the procedure and the sedation                            options and risks were discussed with the patient.                            All questions were answered, and informed consent                            was obtained. Prior Anticoagulants: The patient has                            taken no previous anticoagulant or antiplatelet                            agents. ASA Grade Assessment: II - A patient with                            mild systemic disease. After reviewing the risks                            and benefits, the patient was deemed in                            satisfactory condition to undergo the procedure.  After obtaining informed consent, the colonoscope                            was passed under direct vision. Throughout the                            procedure, the patient's blood pressure, pulse, and                            oxygen saturations were monitored continuously. The                            Colonoscope was introduced through the anus and                            advanced  to the the cecum, identified by                            appendiceal orifice and ileocecal valve. The                            colonoscopy was technically difficult and complex                            due to restricted mobility of the colon. Successful                            completion of the procedure was aided by using                            manual pressure. The patient tolerated the                            procedure well. The quality of the bowel                            preparation was good. The bowel preparation used                            was Miralax. The ileocecal valve, appendiceal                            orifice, and rectum were photographed. Scope In: 10:11:30 AM Scope Out: 10:32:52 AM Scope Withdrawal Time: 0 hours 12 minutes 31 seconds  Total Procedure Duration: 0 hours 21 minutes 22 seconds  Findings:                 The perianal and digital rectal examinations were                            normal.                           Six sessile polyps were found in the transverse  colon and ascending colon. The polyps were 1 to 9                            mm in size. These polyps were removed with a cold                            snare. Resection and retrieval were complete.                            Verification of patient identification for the                            specimen was done. Estimated blood loss was minimal.                           Many small and large-mouthed diverticula were found                            in the sigmoid colon. There was narrowing of the                            colon in association with the diverticular opening.                           The exam was otherwise without abnormality on                            direct and retroflexion views. Complications:            No immediate complications. Estimated Blood Loss:     Estimated blood loss was minimal. Impression:               - Six 1  to 9 mm polyps in the transverse colon and                            in the ascending colon, removed with a cold snare.                            Resected and retrieved.                           - Severe diverticulosis in the sigmoid colon. There                            was narrowing of the colon in association with the                            diverticular opening.                           - The examination was otherwise normal on direct                            and retroflexion views. Recommendation:           -  Patient has a contact number available for                            emergencies. The signs and symptoms of potential                            delayed complications were discussed with the                            patient. Return to normal activities tomorrow.                            Written discharge instructions were provided to the                            patient.                           - Resume previous diet.                           - Continue present medications.                           - Repeat colonoscopy date to be determined after                            pending pathology results are reviewed for                            surveillance. Gatha Mayer, MD 08/04/2016 10:41:01 AM This report has been signed electronically.

## 2016-08-04 NOTE — Patient Instructions (Addendum)
I found and removed 2 polyps that look benign. I will let you know pathology results and when to have another routine colonoscopy by mail and/or My Chart.  You also have diverticulosis - thickened muscle rings and pouches in the colon [place]. Please read the handout about this condition.  I appreciate the opportunity to care for you. Gatha Mayer, MD, FACG  YOU HAD AN ENDOSCOPIC PROCEDURE TODAY AT [facility]:   Refer to the procedure report that was given to you for any specific questions about what was found during the examination.  If the procedure report does not answer your questions, please call your gastroenterologist to clarify.  If you requested that your care partner not be given the details of your procedure findings, then the procedure report has been included in a sealed envelope for you to review at your convenience later.  YOU SHOULD EXPECT: Some feelings of bloating in the abdomen. Passage of more gas than usual.  Walking can help get rid of the air that was put into your GI tract during the procedure and reduce the bloating. If you had a lower endoscopy (such as a colonoscopy or flexible sigmoidoscopy) you may notice spotting of blood in your stool or on the toilet paper. If you underwent a bowel prep for your procedure, you may not have a normal bowel movement for a few days.  Please Note:  You might notice some irritation and congestion in your nose or some drainage.  This is from the oxygen used during your procedure.  There is no need for concern and it should clear up in a day or so.  SYMPTOMS TO REPORT IMMEDIATELY:   Following lower endoscopy (colonoscopy or flexible sigmoidoscopy):  Excessive amounts of blood in the stool  Significant tenderness or worsening of abdominal pains  Swelling of the abdomen that is new, acute  Fever of 100F or higher  For urgent or emergent issues, a gastroenterologist can be reached at any hour by calling [phone]  [phone].   DIET:  We do recommend a small meal at first, but then you may proceed to your regular diet.  Drink plenty of fluids but you should avoid alcoholic beverages for 24 hours.  MEDICATIONS:  Continue present medications.  Please see handouts given to you by your recovery nurse.  ACTIVITY:  You should plan to take it easy for the rest of today and you should NOT DRIVE or use heavy machinery until tomorrow (because of the sedation medicines used during the test).    FOLLOW UP: Our staff will call the number listed on your records the next business day following your procedure to check on you and address any questions or concerns that you may have regarding the information given to you following your procedure. If we do not reach you, we will leave a message.  However, if you are feeling well and you are not experiencing any problems, there is no need to return our call.  We will assume that you have returned to your regular daily activities without incident.  If any biopsies were taken you will be contacted by phone or by letter within the next 1-3 weeks.  Please call us at [phone] if you have not heard about the biopsies in 3 weeks.   Thank you for allowing [place] to provide for your healthcare needs today.   SIGNATURES/CONFIDENTIALITY: You and/or your care partner have signed paperwork which will be entered into your electronic medical record.  These signatures attest to the fact that that the information above on your After Visit Summary has been reviewed and is understood.  Full responsibility of the confidentiality of this discharge information lies with you and/or your care-partner.

## 2016-08-04 NOTE — Progress Notes (Signed)
Pt's states no medical or surgical changes since previsit or office visit. 

## 2016-08-04 NOTE — Progress Notes (Signed)
Called to room to assist during endoscopic procedure.  Patient ID and intended procedure confirmed with present staff. Received instructions for my participation in the procedure from the performing physician.  

## 2016-08-04 NOTE — Progress Notes (Signed)
O/A X3. VSS. PLEASED WITH MAC. REPORT TO [facility]

## 2016-08-05 ENCOUNTER — Other Ambulatory Visit: Payer: Self-pay | Admitting: Internal Medicine

## 2016-08-05 ENCOUNTER — Telehealth: Payer: Self-pay | Admitting: *Deleted

## 2016-08-05 MED ORDER — FERROUS SULFATE 325 (65 FE) MG PO TABS: 325.0000 mg | ORAL_TABLET | Freq: Two times a day (BID) | ORAL | 3 refills | Status: DC

## 2016-08-05 MED ORDER — TRAZODONE HCL 50 MG PO TABS: 25.0000 mg | ORAL_TABLET | Freq: Every evening | ORAL | 5 refills | Status: DC | PRN

## 2016-08-05 NOTE — Telephone Encounter (Signed)
  Follow up Call-  Call back number 08/04/2016  Post procedure Call Back phone  # [phone]  Permission to leave phone message Yes  Some recent data might be hidden     Patient questions:  Do you have a fever, pain , or abdominal swelling? No. Pain Score  0 *  Have you tolerated food without any problems? Yes.    Have you been able to return to your normal activities? Yes.    Do you have any questions about your discharge instructions: Diet   No. Medications  No. Follow up visit  No.  Do you have questions or concerns about your Care? No.  Actions: * If pain score is 4 or above: No action needed, pain <4.

## 2016-08-11 ENCOUNTER — Encounter: Payer: Self-pay | Admitting: Internal Medicine

## 2016-08-11 DIAGNOSIS — Z860100 Personal history of colon polyps, unspecified: Secondary | ICD-10-CM

## 2016-08-11 HISTORY — DX: Personal history of colon polyps, unspecified: Z86.0100

## 2016-08-11 HISTORY — DX: Personal history of colonic polyps: Z86.010

## 2016-08-11 NOTE — Progress Notes (Signed)
Sessile serrated polyps - there were six removed - 5 pieces in container but reported as 2 polyps Recall colon 3 years - we need to contact pathology and have pathology report revised My Chart letter will be sent

## 2016-08-12 ENCOUNTER — Other Ambulatory Visit: Payer: Self-pay | Admitting: Oncology

## 2016-08-12 DIAGNOSIS — Z96651 Presence of right artificial knee joint: Secondary | ICD-10-CM | POA: Diagnosis not present

## 2016-08-12 DIAGNOSIS — Z471 Aftercare following joint replacement surgery: Secondary | ICD-10-CM | POA: Diagnosis not present

## 2016-08-12 DIAGNOSIS — T8484XA Pain due to internal orthopedic prosthetic devices, implants and grafts, initial encounter: Secondary | ICD-10-CM | POA: Diagnosis not present

## 2016-08-14 ENCOUNTER — Other Ambulatory Visit: Payer: Self-pay | Admitting: *Deleted

## 2016-08-14 DIAGNOSIS — C50911 Malignant neoplasm of unspecified site of right female breast: Secondary | ICD-10-CM

## 2016-08-17 ENCOUNTER — Other Ambulatory Visit (HOSPITAL_BASED_OUTPATIENT_CLINIC_OR_DEPARTMENT_OTHER): Payer: PPO

## 2016-08-17 ENCOUNTER — Ambulatory Visit (HOSPITAL_BASED_OUTPATIENT_CLINIC_OR_DEPARTMENT_OTHER): Payer: PPO | Admitting: Oncology

## 2016-08-17 ENCOUNTER — Ambulatory Visit (HOSPITAL_BASED_OUTPATIENT_CLINIC_OR_DEPARTMENT_OTHER): Payer: PPO

## 2016-08-17 DIAGNOSIS — Z923 Personal history of irradiation: Secondary | ICD-10-CM

## 2016-08-17 DIAGNOSIS — M810 Age-related osteoporosis without current pathological fracture: Secondary | ICD-10-CM

## 2016-08-17 DIAGNOSIS — Z170 Estrogen receptor positive status [ER+]: Principal | ICD-10-CM

## 2016-08-17 DIAGNOSIS — Z853 Personal history of malignant neoplasm of breast: Secondary | ICD-10-CM

## 2016-08-17 DIAGNOSIS — C50911 Malignant neoplasm of unspecified site of right female breast: Secondary | ICD-10-CM

## 2016-08-17 DIAGNOSIS — Z9221 Personal history of antineoplastic chemotherapy: Secondary | ICD-10-CM

## 2016-08-17 DIAGNOSIS — C50811 Malignant neoplasm of overlapping sites of right female breast: Secondary | ICD-10-CM

## 2016-08-17 LAB — COMPREHENSIVE METABOLIC PANEL
ALBUMIN: 3.7 g/dL (ref 3.5–5.0)
ALK PHOS: 51 U/L (ref 40–150)
ALT: 10 U/L (ref 0–55)
AST: 17 U/L (ref 5–34)
Anion Gap: 7 mEq/L (ref 3–11)
BUN: 17.7 mg/dL (ref 7.0–26.0)
CALCIUM: 9.8 mg/dL (ref 8.4–10.4)
CHLORIDE: 104 meq/L (ref 98–109)
CO2: 26 mEq/L (ref 22–29)
Creatinine: 0.7 mg/dL (ref 0.6–1.1)
EGFR: 83 mL/min/{1.73_m2} — AB (ref >90)
GLUCOSE: 111 mg/dL (ref 70–140)
POTASSIUM: 3.7 meq/L (ref 3.5–5.1)
SODIUM: 137 meq/L (ref 136–145)
Total Bilirubin: 0.28 mg/dL (ref 0.20–1.20)
Total Protein: 6.7 g/dL (ref 6.4–8.3)

## 2016-08-17 LAB — CBC WITH DIFFERENTIAL/PLATELET
BASO%: 1 % (ref 0.0–2.0)
BASOS ABS: 0.1 10*3/uL (ref 0.0–0.1)
EOS ABS: 0.4 10*3/uL (ref 0.0–0.5)
EOS%: 6.2 % (ref 0.0–7.0)
HCT: 34.9 % (ref 34.8–46.6)
HGB: 11.5 g/dL — ABNORMAL LOW (ref 11.6–15.9)
LYMPH%: 25.5 % (ref 14.0–49.7)
MCH: 27.8 pg (ref 25.1–34.0)
MCHC: 32.9 g/dL (ref 31.5–36.0)
MCV: 84.5 fL (ref 79.5–101.0)
MONO#: 0.5 10*3/uL (ref 0.1–0.9)
MONO%: 8.1 % (ref 0.0–14.0)
NEUT#: 3.4 10*3/uL (ref 1.5–6.5)
NEUT%: 59.2 % (ref 38.4–76.8)
Platelets: 266 10*3/uL (ref 145–400)
RBC: 4.13 10*6/uL (ref 3.70–5.45)
RDW: 17.8 % — ABNORMAL HIGH (ref 11.2–14.5)
WBC: 5.8 10*3/uL (ref 3.9–10.3)
lymph#: 1.5 10*3/uL (ref 0.9–3.3)

## 2016-08-17 MED ORDER — DENOSUMAB 60 MG/ML ~~LOC~~ SOLN
60.0000 mg | Freq: Once | SUBCUTANEOUS | Status: AC
  Administered 2016-08-17: 60 mg via SUBCUTANEOUS
  Filled 2016-08-17: qty 1

## 2016-08-17 NOTE — Progress Notes (Signed)
ID: Brittany [name]   DOB: Apr 04, 1945  MR#: [id number]  JSR#:[id number]   PCP:  Cassandria Anger, MD GYN: SURGEON:  Neldon Mc, MD [place]:  Thea Silversmith, MD OTHER:  Baird Lyons, MD  CHIEF COMPLAINT:  Triple negative Right Breast Cancer; osteoporosis  CURRENT TREATMENT: Observation; denosumab  HISTORY OF PRESENT ILLNESS: From original  intake note:  The patient noted a mass in her right breast and immediately brought it to Dr. Gareth Eagle attention 11/22/2007.  I should note that the patient had a negative routine screening mammogram at [facility] on 07/19/2007.  In the 11/22/2007 study, there was at least a 1.5-cm ill-defined density seen primarily on the right MLO view.  There were a few associated microcalcifications.  By palpation, Dr. Isaiah Blakes felt this was approximately 1.9 cm.  By ultrasound, this was an irregular hypoechoic mass measuring up to 1.9 cm.  There were a few irregular vessels present by Doppler.  There were no abnormal lymph nodes noted.  Breast specific gamma imaging was performed the next day, 11/23/2007, and showed a normal left breast.  On the right, there was a 1.6-cm high-density focus of abnormal isotope activity.  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  HER2-neu was negative at 1+. Her subsequent history is as detailed below.  INTERVAL HISTORY: [name] returns today for follow-up and treatment of her triple negative breast cancer. She continues on denosumab every 6 months. She tolerates this well. Her most recent bone density scan was stable.  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 Overall she "keeps busy. Generally right knee replacement  under Dr. Ihor Gully but is very dissatisfied, still has significant discomfort although she can walk she sets. Aside from these issues a detailed review of systems today was stable.  PAST MEDICAL HISTORY: Past Medical History:  Diagnosis Date  . Acid reflux   . Anemia   . Arthritis   . Breast cancer ([place]) 11/23/2007   right, ER/PR -, HER 2 -  . Cancer [facility])    breast right  triple recet neg, lumpect, chemo, XRT  . Esophageal stricture   . Gastritis 10/2007  . History of chemotherapy    neoadjuvant chemo, last dose 03/2008  . History of radiation therapy 07/25/08 -09/11/08   right breast  . Hx of colonic polyps 08/11/2016  . Hyperlipidemia   . Migraines    rare  . Osteopenia   Significant for osteopenia, history of hiatal hernia with esophageal stricture, status post upper endoscopy under Silvano Rusk 10/10/2007 showing an erosive gastritis in a background of reactive gastropathy ([id number]).  She has a history of rare migraines, and when she was 12, she had a benign growth removed from the back of her right calf.    PAST SURGICAL HISTORY: Past Surgical History:  Procedure Laterality Date  . BREAST LUMPECTOMY  06/26/2008   right, CA   XRT, chemo  . COLONOSCOPY    . EYE SURGERY     left eye cataract surgery 12/23/2015  . growth removed per right thigh    . JOINT REPLACEMENT  12/31/2015   RTK  . TOTAL KNEE ARTHROPLASTY Right 12/31/2015   Procedure: RIGHT TOTAL KNEE ARTHROPLASTY;  Surgeon: [name], MD;  Location: [facility];  Service: Orthopedics;  Laterality: Right;  . UPPER GASTROINTESTINAL ENDOSCOPY      FAMILY HISTORY Family History  Problem Relation Age of Onset  . Heart disease Father   . Cancer Father        lung  . Cancer Sister 71       Breast & Colorectal  . Epilepsy Mother   . Dementia Mother   The patient's father died from lung cancer at the age of 58; he was not a smoker.  The patient's mother is alive at age 76; she has epilepsy.  The patient has a sister diagnosed  with breast cancer when she was 65.  She is doing well.  The patient has a brother in good health.  There is no other history of breast or ovarian cancer in the family.  GYNECOLOGIC HISTORY: She is GX P3, first pregnancy to term age 29, last menstrual period when she was 71 years old.  She never took any hormones.     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  Their son, Brittany [name], works for [employer], an Automotive engineer, and is working for his [employer]; he has 2 children and lives in [place], right outside of [place].  They have a daughter, Brittany [name], who lives in [place] and has 3 children, and a son, Brittany [name], who just married a young woman from [place] and is now living in [place]  The patient attends the [employer].    ADVANCED DIRECTIVES:  HEALTH MAINTENANCE:  (Updated January 2015) Social History  Substance Use Topics  . Smoking status: Former Smoker    Packs/day: 0.25    Years: 5.00    Types: Cigarettes    Quit date: 03/03/1967  . Smokeless tobacco: Never Used     Comment: 6 cig per day when she was smoking/quit years ago  . Alcohol use 4.2 oz/week    7 Cans of beer per week     Comment: 1 beer per day      Colonoscopy:  April 2007/Dr. Gessner  PAP: Not on file  Bone density: March 2015 with a T score of -2.3  Lipid panel: January 2015/Dr. Plotnikov   Allergies  Allergen Reactions  . Boniva [Ibandronic Acid] Nausea Only  . Lipitor [Atorvastatin]     arthralgias    Current Outpatient Prescriptions  Medication Sig Dispense Refill  . aspirin EC 81 MG tablet Take 81 mg by mouth daily.    [name] BIOTIN PO Take 1 tablet by mouth daily.     . calcium carbonate (OSCAL) 1500 (600 Ca) MG TABS tablet Take 600 mg of elemental calcium by mouth 2 (two) times daily with a meal.    . Cholecalciferol  (VITAMIN D-3 PO) Take 1 tablet by mouth daily.     . citalopram (CELEXA) 40 MG tablet TAKE ONE TABLET BY MOUTH DAILY. 90 tablet 1  . Cyanocobalamin (VITAMIN B-12 PO) Take 2,500 mcg by mouth daily.     [name] denosumab (PROLIA) 60 MG/ML SOLN injection Inject 60 mg into the skin once. Administer in upper arm, thigh, or abdomen (Patient taking differently: Inject 60 mg into the skin every 6 (six) months. Administer in upper arm, thigh, or abdomen) 1.8 mL 1  . diphenhydrAMINE (BENADRYL) 25 MG tablet Take 25 mg by mouth  every 6 (six) hours as needed for allergies.     . ferrous sulfate 325 (65 FE) MG tablet Take 1 tablet (325 mg total) by mouth 2 (two) times daily with a meal. 60 tablet 3  . Omega-3 Fatty Acids (FISH OIL) 1200 MG CAPS Take 1 capsule by mouth daily.      . ranitidine (ZANTAC) 150 MG tablet Take 1 tablet (150 mg total) by mouth 2 (two) times daily. 180 tablet 3  . SUMAtriptan (IMITREX) 100 MG tablet Take 1 tablet (100 mg total) by mouth once. May repeat in 2 hours if headache persists or recurs. 9 tablet 3  . traZODone (DESYREL) 50 MG tablet Take 0.5-1 tablets (25-50 mg total) by mouth at bedtime as needed. for sleep Follow-up appt is due must see Md for refills 30 tablet 5  . zolpidem (AMBIEN) 10 MG tablet TAKE 1 TABLET BY MOUTH AT BEDTIME AS NEEDED FOR SLEEP 90 tablet 1   No current facility-administered medications for this visit.     OBJECTIVE: Middle-aged white woman In no acute distress Vitals:   08/17/16 1434  BP: (!) 103/56  Pulse: 72  Resp: 18  Temp: 98.2 F (36.8 C)     Body mass index is 20.29 kg/m.    ECOG FS: 0 Filed Weights   08/17/16 1434  Weight: 127 lb 9.6 oz (57.9 kg)   Sclerae unicteric, EOMs intact Oropharynx clear and moist No cervical or supraclavicular adenopathy Lungs no rales or rhonchi Heart regular rate and rhythm Abd soft, nontender, positive bowel sounds MSK no focal spinal tenderness, no upper extremity lymphedema Neuro: nonfocal, well oriented,  appropriate affect Breasts: The right breast has undergone lumpectomy followed by radiation with no evidence of local recurrence per the left breast is benign. Both axillae are benign.  LAB RESULTS: Lab Results  Component Value Date   WBC 5.8 08/17/2016   NEUTROABS 3.4 08/17/2016   HGB 11.5 (L) 08/17/2016   HCT 34.9 08/17/2016   MCV 84.5 08/17/2016   PLT 266 08/17/2016      Chemistry      Component Value Date/Time   NA 137 08/17/2016 1412   K 3.7 08/17/2016 1412   CL 102 07/28/2016 1652   CL 98 03/21/2012 1053   CO2 26 08/17/2016 1412   BUN 17.7 08/17/2016 1412   CREATININE 0.7 08/17/2016 1412      Component Value Date/Time   CALCIUM 9.8 08/17/2016 1412   ALKPHOS 51 08/17/2016 1412   AST 17 08/17/2016 1412   ALT 10 08/17/2016 1412   BILITOT 0.28 08/17/2016 1412        STUDIES: Mammography at [facility] 05/13/2016 found the breast density to be category C. There was no evidence of malignancy.   ASSESSMENT: 71 y.o. [place] woman   (1) s/p right breast bopsy 11/2007 for a clinical  T1c N0, stage IA Invasive ductal carcinoma, grade 2-3, triple-negative with an MIB-1 of 50%.  (2) neoadjuvant chemotherapy consisted of docetaxel/ gemcitabine/ bevacizumab followed by doxorubicin/ cyclophosphamide/ bevacizumab as per the NSABP B-40 protocol.  Last bevacizumab dose was January of 2010.   (3) status-post right lumpectomy and sentinel lymph node dissection in April of 2010 showing a complete pathologic response   (4) She completed adjuvant radiation July 2010.   (5)  Osteoporosis: on vit D/ calcium; received zolendronate  OCT 2013 and SEPT 2014, started prolia/ denosumab 11/30/2013, to be repeated every 6 months  (a) dexa scan 05/15/2013 showed a T score of -2/3 (improved)  (b)  DEXA scan 05/13/2015 showed a T score of -2.2 (stable  PLAN:  Clema Is now 8 years out from definitive surgery for her breast cancer with no evidence of disease recurrence. This is very  favorable.  She continues on denosumab/Prolia for her osteopenia. She will receive a dose today and again in 6 months from now. I will see her again in one year with her dose at that time. She will have had a repeat bone density scan by then and we can consider whether or not to continue the denosumab at that time  I offered her participation in our survivorship program but she tells me she wants to continue to see me on a yearly basis indefinitely whether or not she continues to denosumab.  She knows to call for any problems that may develop before the next visit here.       Chauncey Cruel, MD     08/17/2016

## 2016-08-18 ENCOUNTER — Telehealth: Payer: Self-pay | Admitting: *Deleted

## 2016-08-18 NOTE — Progress Notes (Signed)
Please place orders in EPIC as patient is being scheduled for a pre-op appointment! Thank you! 

## 2016-08-18 NOTE — Telephone Encounter (Signed)
"  On oversight was made with scheduling when I was there yesterday.  I have lab scheduled 02-16-2017 but no prolia injection appointment was scheduled.  Please schedule this.  This is why I have lab."  Injection appointment is a work in progress for the treatment scheduler.  Injection will be scheduled and she will receive a call.   Scheduling message sent.

## 2016-09-04 NOTE — Patient Instructions (Signed)
Brittany [name]  09/04/2016   Your procedure is scheduled on: 09-14-16  Report to [facility] Main  Entrance   Report to admitting at 1145AM   Call this number if you have problems the morning of surgery [phone]   Remember: ONLY 1 PERSON MAY GO WITH YOU TO SHORT STAY TO GET  READY MORNING OF YOUR SURGERY.  Do not eat food After Midnight. You may have clear liquids from midnight until 815am day of surgery. Nothing by mouth after 815am!     Take these medicines the morning of surgery with A SIP OF WATER: celexa, imitrex, zantac                                 You may not have any metal on your body including hair pins and              piercings  Do not wear jewelry, make-up, lotions, powders or perfumes, deodorant             Do not wear nail polish.  Do not shave  48 hours prior to surgery.          Do not bring valuables to the hospital. [place].  Contacts, dentures or bridgework may not be worn into surgery.       Patients discharged the day of surgery will not be allowed to drive home.  Name and phone number of your driver:  Special Instructions: N/A              Please read over the following fact sheets you were given: _____________________________________________________________________   CLEAR LIQUID DIET   Foods Allowed                                                                     Foods Excluded  Coffee and tea, regular and decaf                             liquids that you cannot  Plain Jell-O in any flavor                                             see through such as: Fruit ices (not with fruit pulp)                                     milk, soups, orange juice  Iced Popsicles                                    All solid food Carbonated beverages, regular and diet  Cranberry, grape and apple juices Sports drinks like Gatorade Lightly seasoned  clear broth or consume(fat free) Sugar, honey syrup  Sample Menu Breakfast                                Lunch                                     Supper Cranberry juice                    Beef broth                            Chicken broth Jell-O                                     Grape juice                           Apple juice Coffee or tea                        Jell-O                                      Popsicle                                                Coffee or tea                        Coffee or tea  _____________________________________________________________________  [facility] - Preparing for Surgery Before surgery, you can play an important role.  Because skin is not sterile, your skin needs to be as free of germs as possible.  You can reduce the number of germs on your skin by washing with CHG (chlorahexidine gluconate) soap before surgery.  CHG is an antiseptic cleaner which kills germs and bonds with the skin to continue killing germs even after washing. Please DO NOT use if you have an allergy to CHG or antibacterial soaps.  If your skin becomes reddened/irritated stop using the CHG and inform your nurse when you arrive at Short Stay. Do not shave (including legs and underarms) for at least 48 hours prior to the first CHG shower.  You may shave your face/neck. Please follow these instructions carefully:  1.  Shower with CHG Soap the night before surgery and the  morning of Surgery.  2.  If you choose to wash your hair, wash your hair first as usual with your  normal  shampoo.  3.  After you shampoo, rinse your hair and body thoroughly to remove the  shampoo.                           4.  Use CHG as you would any other liquid soap.  You can apply chg directly  to the skin and wash  Gently with a scrungie or clean washcloth.  5.  Apply the CHG Soap to your body ONLY FROM THE NECK DOWN.   Do not use on face/ open                           Wound or  open sores. Avoid contact with eyes, ears mouth and genitals (private parts).                       Wash face,  Genitals (private parts) with your normal soap.             6.  Wash thoroughly, paying special attention to the area where your surgery  will be performed.  7.  Thoroughly rinse your body with warm water from the neck down.  8.  DO NOT shower/wash with your normal soap after using and rinsing off  the CHG Soap.                9.  Pat yourself dry with a clean towel.            10.  Wear clean pajamas.            11.  Place clean sheets on your bed the night of your first shower and do not  sleep with pets. Day of Surgery : Do not apply any lotions/deodorants the morning of surgery.  Please wear clean clothes to the hospital/surgery center.  FAILURE TO FOLLOW THESE INSTRUCTIONS MAY RESULT IN THE CANCELLATION OF YOUR SURGERY PATIENT SIGNATURE_________________________________  NURSE SIGNATURE__________________________________  ________________________________________________________________________   Adam Phenix  An incentive spirometer is a tool that can help keep your lungs clear and active. This tool measures how well you are filling your lungs with each breath. Taking long deep breaths may help reverse or decrease the chance of developing breathing (pulmonary) problems (especially infection) following:  A long period of time when you are unable to move or be active. BEFORE THE PROCEDURE   If the spirometer includes an indicator to show your best effort, your nurse or respiratory therapist will set it to a desired goal.  If possible, sit up straight or lean slightly forward. Try not to slouch.  Hold the incentive spirometer in an upright position. INSTRUCTIONS FOR USE  1. Sit on the edge of your bed if possible, or sit up as far as you can in bed or on a chair. 2. Hold the incentive spirometer in an upright position. 3. Breathe out normally. 4. Place the  mouthpiece in your mouth and seal your lips tightly around it. 5. Breathe in slowly and as deeply as possible, raising the piston or the ball toward the top of the column. 6. Hold your breath for 3-5 seconds or for as long as possible. Allow the piston or ball to fall to the bottom of the column. 7. Remove the mouthpiece from your mouth and breathe out normally. 8. Rest for a few seconds and repeat Steps 1 through 7 at least 10 times every 1-2 hours when you are awake. Take your time and take a few normal breaths between deep breaths. 9. The spirometer may include an indicator to show your best effort. Use the indicator as a goal to work toward during each repetition. 10. After each set of 10 deep breaths, practice coughing to be sure your lungs are clear. If you have an incision (the cut made at the time of  surgery), support your incision when coughing by placing a pillow or rolled up towels firmly against it. Once you are able to get out of bed, walk around indoors and cough well. You may stop using the incentive spirometer when instructed by your caregiver.  RISKS AND COMPLICATIONS  Take your time so you do not get dizzy or light-headed.  If you are in pain, you may need to take or ask for pain medication before doing incentive spirometry. It is harder to take a deep breath if you are having pain. AFTER USE  Rest and breathe slowly and easily.  It can be helpful to keep track of a log of your progress. Your caregiver can provide you with a simple table to help with this. If you are using the spirometer at home, follow these instructions: [place] IF:   You are having difficultly using the spirometer.  You have trouble using the spirometer as often as instructed.  Your pain medication is not giving enough relief while using the spirometer.  You develop fever of 100.5 F (38.1 C) or higher. SEEK IMMEDIATE MEDICAL CARE IF:   You cough up bloody sputum that had not been present  before.  You develop fever of 102 F (38.9 C) or greater.  You develop worsening pain at or near the incision site. MAKE SURE YOU:   Understand these instructions.  Will watch your condition.  Will get help right away if you are not doing well or get worse. Document Released: 06/29/2006 Document Revised: 05/11/2011 Document Reviewed: 08/30/2006 Exit[employer] Patient Information 2014 [employer], [place].   ________________________________________________________________________  WHAT IS A BLOOD TRANSFUSION? Blood Transfusion Information  A transfusion is the replacement of blood or some of its parts. Blood is made up of multiple cells which provide different functions.  Red blood cells carry oxygen and are used for blood loss replacement.  White blood cells fight against infection.  Platelets control bleeding.  Plasma helps clot blood.  Other blood products are available for specialized needs, such as hemophilia or other clotting disorders. BEFORE THE TRANSFUSION  Who gives blood for transfusions?   Healthy volunteers who are fully evaluated to make sure their blood is safe. This is blood bank blood. Transfusion therapy is the safest it has ever been in the practice of medicine. Before blood is taken from a donor, a complete history is taken to make sure that person has no history of diseases nor engages in risky social behavior (examples are intravenous drug use or sexual activity with multiple partners). The donor's travel history is screened to minimize risk of transmitting infections, such as malaria. The donated blood is tested for signs of infectious diseases, such as HIV and hepatitis. The blood is then tested to be sure it is compatible with you in order to minimize the chance of a transfusion reaction. If you or a relative donates blood, this is often done in anticipation of surgery and is not appropriate for emergency situations. It takes many days to process the donated  blood. RISKS AND COMPLICATIONS Although transfusion therapy is very safe and saves many lives, the main dangers of transfusion include:   Getting an infectious disease.  Developing a transfusion reaction. This is an allergic reaction to something in the blood you were given. Every precaution is taken to prevent this. The decision to have a blood transfusion has been considered carefully by your caregiver before blood is given. Blood is not given unless the benefits outweigh the risks. AFTER THE  TRANSFUSION  Right after receiving a blood transfusion, you will usually feel much better and more energetic. This is especially true if your red blood cells have gotten low (anemic). The transfusion raises the level of the red blood cells which carry oxygen, and this usually causes an energy increase.  The nurse administering the transfusion will monitor you carefully for complications. HOME CARE INSTRUCTIONS  No special instructions are needed after a transfusion. You may find your energy is better. Speak with your caregiver about any limitations on activity for underlying diseases you may have. SEEK MEDICAL CARE IF:   Your condition is not improving after your transfusion.  You develop redness or irritation at the intravenous (IV) site. SEEK IMMEDIATE MEDICAL CARE IF:  Any of the following symptoms occur over the next 12 hours:  Shaking chills.  You have a temperature by mouth above 102 F (38.9 C), not controlled by medicine.  Chest, back, or muscle pain.  People around you feel you are not acting correctly or are confused.  Shortness of breath or difficulty breathing.  Dizziness and fainting.  You get a rash or develop hives.  You have a decrease in urine output.  Your urine turns a dark color or changes to pink, red, or brown. Any of the following symptoms occur over the next 10 days:  You have a temperature by mouth above 102 F (38.9 C), not controlled by  medicine.  Shortness of breath.  Weakness after normal activity.  The white part of the eye turns yellow (jaundice).  You have a decrease in the amount of urine or are urinating less often.  Your urine turns a dark color or changes to pink, red, or brown. Document Released: 02/14/2000 Document Revised: 05/11/2011 Document Reviewed: 10/03/2007 [facility] Patient Information 2014 [place], [place].  _______________________________________________________________________

## 2016-09-04 NOTE — Progress Notes (Signed)
EKG 01-29-16 epic CBCdiff, CMP 08-17-16 epic Stress ECHO 02-12-14 epic

## 2016-09-08 ENCOUNTER — Encounter (HOSPITAL_COMMUNITY): Payer: Self-pay

## 2016-09-08 ENCOUNTER — Encounter (HOSPITAL_COMMUNITY)
Admission: RE | Admit: 2016-09-08 | Discharge: 2016-09-08 | Disposition: A | Discharge status: Home / Self Care | Payer: PPO | Source: Ambulatory Visit | Attending: Orthopedic Surgery | Admitting: Orthopedic Surgery

## 2016-09-08 DIAGNOSIS — S76101A Unspecified injury of right quadriceps muscle, fascia and tendon, initial encounter: Secondary | ICD-10-CM | POA: Insufficient documentation

## 2016-09-08 DIAGNOSIS — T8133XA Disruption of traumatic injury wound repair, initial encounter: Secondary | ICD-10-CM | POA: Diagnosis not present

## 2016-09-08 DIAGNOSIS — Z01812 Encounter for preprocedural laboratory examination: Secondary | ICD-10-CM | POA: Insufficient documentation

## 2016-09-10 NOTE — Progress Notes (Signed)
Spoke with patient and informed her of time change for surgery on 09/14/2016. Patient instructed to arrive at 0930 at Admitting on 09/14/2016 and nothing to eat or drink after midnight the night before surgery and take medications as instructed at pre-op appointment. Patient verbalized understanding.

## 2016-09-13 NOTE — H&P (Signed)
Brittany [name] is an 71 y.o. female.    Chief Complaint:    Partial quad tendon rupture  Procedure:    Repair of the right quad tendon  HPI: Pt is a 71 y.o. female complaining of right knee pain with a palpable defect on the medial side of the quad tendon area.  Pain had continually increased since the beginning.  She does not lack any ROM.   X-rays in the clinic show the previous TKA. Various options are discussed with the patient. Risks, benefits and expectations were discussed with the patient. Patient understand the risks, benefits and expectations and wishes to proceed with surgery.   PCP: Plotnikov, Evie Lacks, MD  D/C Plans:       Home  Post-op Meds:       No Rx given   Tranexamic Acid:      To be given - IV   Decadron:      is to be given  FYI:     ASA  Norco  DME:   ?  PT: ?   PMH: Past Medical History:  Diagnosis Date  . Acid reflux   . Anemia   . Arthritis   . Breast cancer ([place]) 11/23/2007   right, ER/PR -, HER 2 -  . Cancer [facility])    breast right  triple recet neg, lumpect, chemo, XRT  . Esophageal stricture   . Gastritis 10/2007  . History of chemotherapy    neoadjuvant chemo, last dose 03/2008  . History of radiation therapy 07/25/08 -09/11/08   right breast  . Hx of colonic polyps 08/11/2016  . Hyperlipidemia   . Migraines    rare  . Osteopenia     PSH: Past Surgical History:  Procedure Laterality Date  . BREAST LUMPECTOMY  06/26/2008   right, CA   XRT, chemo  . COLONOSCOPY    . EYE SURGERY     left eye cataract surgery 12/23/2015  . growth removed per right thigh    . JOINT REPLACEMENT  12/31/2015   RTK  . TOTAL KNEE ARTHROPLASTY Right 12/31/2015   Procedure: RIGHT TOTAL KNEE ARTHROPLASTY;  Surgeon: [name], MD;  Location: [facility] ORS;  Service: Orthopedics;  Laterality: Right;  . UPPER GASTROINTESTINAL ENDOSCOPY      Social History:  reports that she quit smoking about 49 years ago. Her smoking use included Cigarettes. She has a 1.25  pack-year smoking history. She has never used smokeless tobacco. She reports that she drinks about 4.2 oz of alcohol per week . She reports that she does not use drugs.  Allergies:  Allergies  Allergen Reactions  . Boniva [Ibandronic Acid] Nausea Only  . Lipitor [Atorvastatin] Other (See Comments)    Arthralgias (joint pain)    Medications: No current facility-administered medications for this encounter.    Current Outpatient Prescriptions  Medication Sig Dispense Refill  . aspirin EC 81 MG tablet Take 81 mg by mouth daily.    . Biotin 1000 MCG tablet Take 1,000 mcg by mouth daily.    . Calcium Carb-Cholecalciferol (CALCIUM 600 + D PO) Take 1 tablet by mouth 2 (two) times daily.    . Cholecalciferol (VITAMIN D3) 1000 units CAPS Take 1,000 Units by mouth daily.    . citalopram (CELEXA) 40 MG tablet TAKE ONE TABLET BY MOUTH DAILY. 90 tablet 1  . denosumab (PROLIA) 60 MG/ML SOLN injection Inject 60 mg into the skin once. Administer in upper arm, thigh, or abdomen (Patient taking differently: Inject  60 mg into the skin every 6 (six) months. Administer in upper arm, thigh, or abdomen) 1.8 mL 1  . diphenhydrAMINE (BENADRYL) 25 MG tablet Take 25 mg by mouth 2 (two) times daily as needed for allergies.     . ferrous sulfate 325 (65 FE) MG tablet Take 1 tablet (325 mg total) by mouth 2 (two) times daily with a meal. 60 tablet 3  . Omega-3 Fatty Acids (FISH OIL) 1200 MG CAPS Take 1,200 mg by mouth daily.     . ranitidine (ZANTAC) 150 MG tablet Take 1 tablet (150 mg total) by mouth 2 (two) times daily. 180 tablet 3  . SUMAtriptan (IMITREX) 100 MG tablet Take 1 tablet (100 mg total) by mouth once. May repeat in 2 hours if headache persists or recurs. 9 tablet 3  . traZODone (DESYREL) 50 MG tablet Take 0.5-1 tablets (25-50 mg total) by mouth at bedtime as needed. for sleep Follow-up appt is due must see Md for refills (Patient taking differently: Take 50 mg by mouth at bedtime as needed for sleep. for  sleep Follow-up appt is due must see Md for refills) 30 tablet 5  . vitamin B-12 (CYANOCOBALAMIN) 1000 MCG tablet Take 1,000 mcg by mouth daily.    [name] zolpidem (AMBIEN) 10 MG tablet TAKE 1 TABLET BY MOUTH AT BEDTIME AS NEEDED FOR SLEEP 90 tablet 1      Review of Systems  Constitutional: Negative.   HENT: Negative.   Eyes: Negative.   Respiratory: Negative.   Cardiovascular: Negative.   Gastrointestinal: Positive for heartburn.  Genitourinary: Negative.   Musculoskeletal: Positive for joint pain.  Skin: Negative.   Neurological: Positive for headaches.  Endo/Heme/Allergies: Negative.   Psychiatric/Behavioral: Negative.        Physical Exam  Constitutional: She is oriented to person, place, and time. She appears well-developed.  HENT:  Head: Normocephalic.  Eyes: Pupils are equal, round, and reactive to light.  Neck: Neck supple. No JVD present. No tracheal deviation present. No thyromegaly present.  Cardiovascular: Normal rate, regular rhythm and intact distal pulses.   Respiratory: Effort normal and breath sounds normal. No respiratory distress. She has no wheezes.  GI: Soft. There is no tenderness. There is no guarding.  Musculoskeletal:       Left knee: She exhibits deformity and laceration (healed previous incision). She exhibits normal range of motion, no effusion, no ecchymosis and normal alignment. Tenderness found.  Lymphadenopathy:    She has no cervical adenopathy.  Neurological: She is alert and oriented to person, place, and time.  Skin: Skin is warm and dry.  Psychiatric: She has a normal mood and affect.       Assessment/Plan Assessment:    Partial quad tendon rupture  Plan: Patient will undergo a repair of the right quad tendon on 09/14/2016 per Dr. Alvan Dame at [facility]. Risks benefits and expectations were discussed with the patient. Patient understand risks, benefits and expectations and wishes to proceed.   Brittany Pugh Chevelle Coulson    PA-C  09/13/2016, 9:08 PM

## 2016-09-14 ENCOUNTER — Encounter (HOSPITAL_COMMUNITY): Payer: Self-pay | Admitting: *Deleted

## 2016-09-14 ENCOUNTER — Ambulatory Visit (HOSPITAL_COMMUNITY)
Admission: RE | Admit: 2016-09-14 | Discharge: 2016-09-15 | Disposition: A | Discharge status: Home / Self Care | Payer: PPO | Source: Ambulatory Visit | Attending: Orthopedic Surgery | Admitting: Orthopedic Surgery

## 2016-09-14 ENCOUNTER — Ambulatory Visit (HOSPITAL_COMMUNITY): Payer: PPO | Admitting: Anesthesiology

## 2016-09-14 ENCOUNTER — Other Ambulatory Visit: Payer: Self-pay

## 2016-09-14 ENCOUNTER — Encounter (HOSPITAL_COMMUNITY)
Admission: RE | Disposition: A | Discharge status: Home / Self Care | Payer: Self-pay | Source: Ambulatory Visit | Attending: Orthopedic Surgery

## 2016-09-14 DIAGNOSIS — T8132XA Disruption of internal operation (surgical) wound, not elsewhere classified, initial encounter: Secondary | ICD-10-CM | POA: Insufficient documentation

## 2016-09-14 DIAGNOSIS — Z79899 Other long term (current) drug therapy: Secondary | ICD-10-CM | POA: Diagnosis not present

## 2016-09-14 DIAGNOSIS — Z9011 Acquired absence of right breast and nipple: Secondary | ICD-10-CM | POA: Diagnosis not present

## 2016-09-14 DIAGNOSIS — F418 Other specified anxiety disorders: Secondary | ICD-10-CM | POA: Diagnosis not present

## 2016-09-14 DIAGNOSIS — E785 Hyperlipidemia, unspecified: Secondary | ICD-10-CM | POA: Insufficient documentation

## 2016-09-14 DIAGNOSIS — K449 Diaphragmatic hernia without obstruction or gangrene: Secondary | ICD-10-CM | POA: Diagnosis not present

## 2016-09-14 DIAGNOSIS — S76191A Other specified injury of right quadriceps muscle, fascia and tendon, initial encounter: Secondary | ICD-10-CM

## 2016-09-14 DIAGNOSIS — Z9221 Personal history of antineoplastic chemotherapy: Secondary | ICD-10-CM | POA: Diagnosis not present

## 2016-09-14 DIAGNOSIS — M1990 Unspecified osteoarthritis, unspecified site: Secondary | ICD-10-CM | POA: Insufficient documentation

## 2016-09-14 DIAGNOSIS — Z87891 Personal history of nicotine dependence: Secondary | ICD-10-CM | POA: Diagnosis not present

## 2016-09-14 DIAGNOSIS — Z888 Allergy status to other drugs, medicaments and biological substances status: Secondary | ICD-10-CM | POA: Insufficient documentation

## 2016-09-14 DIAGNOSIS — Z853 Personal history of malignant neoplasm of breast: Secondary | ICD-10-CM | POA: Diagnosis not present

## 2016-09-14 DIAGNOSIS — Z96651 Presence of right artificial knee joint: Secondary | ICD-10-CM | POA: Insufficient documentation

## 2016-09-14 DIAGNOSIS — Z923 Personal history of irradiation: Secondary | ICD-10-CM | POA: Diagnosis not present

## 2016-09-14 DIAGNOSIS — M8580 Other specified disorders of bone density and structure, unspecified site: Secondary | ICD-10-CM | POA: Insufficient documentation

## 2016-09-14 DIAGNOSIS — Z7982 Long term (current) use of aspirin: Secondary | ICD-10-CM | POA: Insufficient documentation

## 2016-09-14 DIAGNOSIS — K219 Gastro-esophageal reflux disease without esophagitis: Secondary | ICD-10-CM | POA: Insufficient documentation

## 2016-09-14 DIAGNOSIS — G8918 Other acute postprocedural pain: Secondary | ICD-10-CM | POA: Diagnosis not present

## 2016-09-14 DIAGNOSIS — M66251 Spontaneous rupture of extensor tendons, right thigh: Secondary | ICD-10-CM | POA: Diagnosis not present

## 2016-09-14 HISTORY — PX: QUADRICEPS TENDON REPAIR: SHX756

## 2016-09-14 HISTORY — DX: Other specified injury of right quadriceps muscle, fascia and tendon, initial encounter: S76.191A

## 2016-09-14 LAB — TYPE AND SCREEN
ABO/RH(D): O POS
Antibody Screen: NEGATIVE

## 2016-09-14 SURGERY — REPAIR, TENDON, QUADRICEPS
Anesthesia: Spinal | Site: Knee | Laterality: Right

## 2016-09-14 MED ORDER — MIDAZOLAM HCL 2 MG/2ML IJ SOLN
INTRAMUSCULAR | Status: AC
  Administered 2016-09-14: 2 mg via INTRAVENOUS
  Filled 2016-09-14: qty 2

## 2016-09-14 MED ORDER — VITAMIN D 1000 UNITS PO TABS
1000.0000 [IU] | ORAL_TABLET | Freq: Every day | ORAL | Status: DC
  Administered 2016-09-15: 10:00:00 1000 [IU] via ORAL
  Filled 2016-09-14: qty 1

## 2016-09-14 MED ORDER — MENTHOL 3 MG MT LOZG: 1.0000 | LOZENGE | OROMUCOSAL | Status: DC | PRN

## 2016-09-14 MED ORDER — DEXAMETHASONE SODIUM PHOSPHATE 10 MG/ML IJ SOLN
INTRAMUSCULAR | Status: AC
  Filled 2016-09-14: qty 1

## 2016-09-14 MED ORDER — KETOROLAC TROMETHAMINE 30 MG/ML IJ SOLN
INTRAMUSCULAR | Status: AC
  Filled 2016-09-14: qty 1

## 2016-09-14 MED ORDER — ONDANSETRON HCL 4 MG/2ML IJ SOLN
INTRAMUSCULAR | Status: DC | PRN
  Administered 2016-09-14: 4 mg via INTRAVENOUS

## 2016-09-14 MED ORDER — SUMATRIPTAN SUCCINATE 50 MG PO TABS
100.0000 mg | ORAL_TABLET | Freq: Once | ORAL | Status: DC
  Filled 2016-09-14: qty 2

## 2016-09-14 MED ORDER — FENTANYL CITRATE (PF) 100 MCG/2ML IJ SOLN
INTRAMUSCULAR | Status: AC
  Administered 2016-09-14: 50 ug via INTRAVENOUS
  Filled 2016-09-14: qty 2

## 2016-09-14 MED ORDER — HYDROMORPHONE HCL-NACL 0.5-0.9 MG/ML-% IV SOSY: 0.2500 mg | PREFILLED_SYRINGE | INTRAVENOUS | Status: DC | PRN

## 2016-09-14 MED ORDER — ONDANSETRON HCL 4 MG/2ML IJ SOLN
INTRAMUSCULAR | Status: AC
  Filled 2016-09-14: qty 2

## 2016-09-14 MED ORDER — ASPIRIN EC 81 MG PO TBEC: 81.0000 mg | DELAYED_RELEASE_TABLET | Freq: Every day | ORAL | Status: DC

## 2016-09-14 MED ORDER — LACTATED RINGERS IV SOLN
INTRAVENOUS | Status: DC | PRN
  Administered 2016-09-14 (×2): via INTRAVENOUS

## 2016-09-14 MED ORDER — CEFAZOLIN SODIUM-DEXTROSE 2-4 GM/100ML-% IV SOLN
INTRAVENOUS | Status: AC
  Filled 2016-09-14: qty 100

## 2016-09-14 MED ORDER — CITALOPRAM HYDROBROMIDE 20 MG PO TABS
40.0000 mg | ORAL_TABLET | Freq: Every day | ORAL | Status: DC
  Administered 2016-09-15: 10:00:00 40 mg via ORAL
  Filled 2016-09-14: qty 2

## 2016-09-14 MED ORDER — DENOSUMAB 60 MG/ML ~~LOC~~ SOLN: 60.0000 mg | SUBCUTANEOUS | Status: DC

## 2016-09-14 MED ORDER — TRAZODONE HCL 50 MG PO TABS
50.0000 mg | ORAL_TABLET | Freq: Every evening | ORAL | Status: DC | PRN
  Administered 2016-09-15: 50 mg via ORAL
  Filled 2016-09-14: qty 1

## 2016-09-14 MED ORDER — BUPIVACAINE-EPINEPHRINE (PF) 0.5% -1:200000 IJ SOLN
INTRAMUSCULAR | Status: DC | PRN
  Administered 2016-09-14: 30 mL via PERINEURAL

## 2016-09-14 MED ORDER — ZOLPIDEM TARTRATE 5 MG PO TABS
5.0000 mg | ORAL_TABLET | Freq: Every evening | ORAL | Status: DC | PRN
  Administered 2016-09-14: 22:00:00 5 mg via ORAL
  Filled 2016-09-14: qty 1

## 2016-09-14 MED ORDER — FENTANYL CITRATE (PF) 100 MCG/2ML IJ SOLN
100.0000 ug | Freq: Once | INTRAMUSCULAR | Status: AC
  Administered 2016-09-14: 50 ug via INTRAVENOUS

## 2016-09-14 MED ORDER — ONDANSETRON HCL 4 MG/2ML IJ SOLN: 4.0000 mg | Freq: Four times a day (QID) | INTRAMUSCULAR | Status: DC | PRN

## 2016-09-14 MED ORDER — CALCIUM CARBONATE-VITAMIN D 500-200 MG-UNIT PO TABS
1.0000 | ORAL_TABLET | Freq: Two times a day (BID) | ORAL | Status: DC
  Administered 2016-09-14 – 2016-09-15 (×2): 1 via ORAL
  Filled 2016-09-14 (×2): qty 1

## 2016-09-14 MED ORDER — BUPIVACAINE-EPINEPHRINE (PF) 0.25% -1:200000 IJ SOLN
INTRAMUSCULAR | Status: AC
  Filled 2016-09-14: qty 30

## 2016-09-14 MED ORDER — BIOTIN 1000 MCG PO TABS: 1000.0000 ug | ORAL_TABLET | Freq: Every day | ORAL | Status: DC

## 2016-09-14 MED ORDER — SODIUM CHLORIDE 0.9 % IJ SOLN
INTRAMUSCULAR | Status: DC | PRN
  Administered 2016-09-14: 30 mL

## 2016-09-14 MED ORDER — CEFAZOLIN SODIUM-DEXTROSE 2-4 GM/100ML-% IV SOLN
2.0000 g | INTRAVENOUS | Status: AC
  Administered 2016-09-14: 2 g via INTRAVENOUS

## 2016-09-14 MED ORDER — SODIUM CHLORIDE 0.9 % IR SOLN
Status: DC | PRN
  Administered 2016-09-14: 1000 mL

## 2016-09-14 MED ORDER — TRANEXAMIC ACID 1000 MG/10ML IV SOLN
1000.0000 mg | INTRAVENOUS | Status: DC
  Filled 2016-09-14: qty 10

## 2016-09-14 MED ORDER — ACETAMINOPHEN 325 MG PO TABS: 650.0000 mg | ORAL_TABLET | Freq: Four times a day (QID) | ORAL | Status: DC | PRN

## 2016-09-14 MED ORDER — BUPIVACAINE IN DEXTROSE 0.75-8.25 % IT SOLN
INTRATHECAL | Status: DC | PRN
  Administered 2016-09-14: 2 mL via INTRATHECAL

## 2016-09-14 MED ORDER — METOCLOPRAMIDE HCL 5 MG PO TABS: 5.0000 mg | ORAL_TABLET | Freq: Three times a day (TID) | ORAL | Status: DC | PRN

## 2016-09-14 MED ORDER — ONDANSETRON HCL 4 MG/2ML IJ SOLN: 4.0000 mg | Freq: Once | INTRAMUSCULAR | Status: DC | PRN

## 2016-09-14 MED ORDER — POLYETHYLENE GLYCOL 3350 17 G PO PACK: 17.0000 g | PACK | Freq: Every day | ORAL | Status: DC | PRN

## 2016-09-14 MED ORDER — PROPOFOL 10 MG/ML IV BOLUS
INTRAVENOUS | Status: DC | PRN
  Administered 2016-09-14 (×2): 20 mg via INTRAVENOUS

## 2016-09-14 MED ORDER — CHLORHEXIDINE GLUCONATE 4 % EX LIQD: 60.0000 mL | Freq: Once | CUTANEOUS | Status: DC

## 2016-09-14 MED ORDER — SODIUM CHLORIDE 0.9 % IJ SOLN
INTRAMUSCULAR | Status: AC
  Filled 2016-09-14: qty 50

## 2016-09-14 MED ORDER — TRAMADOL HCL 50 MG PO TABS
50.0000 mg | ORAL_TABLET | ORAL | Status: DC | PRN
  Administered 2016-09-14: 100 mg via ORAL
  Administered 2016-09-14: 17:00:00 50 mg via ORAL
  Administered 2016-09-15: 10:00:00 100 mg via ORAL
  Filled 2016-09-14 (×2): qty 2
  Filled 2016-09-14: qty 1

## 2016-09-14 MED ORDER — OMEGA-3-ACID ETHYL ESTERS 1 G PO CAPS
1.0000 g | ORAL_CAPSULE | Freq: Every day | ORAL | Status: DC
  Administered 2016-09-15: 1 g via ORAL
  Filled 2016-09-14: qty 1

## 2016-09-14 MED ORDER — VITAMIN B-12 1000 MCG PO TABS
1000.0000 ug | ORAL_TABLET | Freq: Every day | ORAL | Status: DC
  Administered 2016-09-15: 1000 ug via ORAL
  Filled 2016-09-14: qty 1

## 2016-09-14 MED ORDER — ASPIRIN 81 MG PO CHEW
81.0000 mg | CHEWABLE_TABLET | Freq: Two times a day (BID) | ORAL | Status: DC
  Administered 2016-09-14 – 2016-09-15 (×2): 81 mg via ORAL
  Filled 2016-09-14 (×2): qty 1

## 2016-09-14 MED ORDER — METOCLOPRAMIDE HCL 5 MG/ML IJ SOLN: 5.0000 mg | Freq: Three times a day (TID) | INTRAMUSCULAR | Status: DC | PRN

## 2016-09-14 MED ORDER — PROPOFOL 10 MG/ML IV BOLUS
INTRAVENOUS | Status: AC
  Filled 2016-09-14: qty 20

## 2016-09-14 MED ORDER — SUMATRIPTAN SUCCINATE 50 MG PO TABS
100.0000 mg | ORAL_TABLET | ORAL | Status: DC | PRN
  Filled 2016-09-14: qty 2

## 2016-09-14 MED ORDER — CEFAZOLIN SODIUM-DEXTROSE 1-4 GM/50ML-% IV SOLN
1.0000 g | Freq: Four times a day (QID) | INTRAVENOUS | Status: AC
  Administered 2016-09-14 – 2016-09-15 (×2): 1 g via INTRAVENOUS
  Filled 2016-09-14 (×2): qty 50

## 2016-09-14 MED ORDER — ONDANSETRON HCL 4 MG PO TABS: 4.0000 mg | ORAL_TABLET | Freq: Four times a day (QID) | ORAL | Status: DC | PRN

## 2016-09-14 MED ORDER — DEXTROSE 5 % IV SOLN
500.0000 mg | Freq: Four times a day (QID) | INTRAVENOUS | Status: DC | PRN
  Administered 2016-09-14: 500 mg via INTRAVENOUS
  Filled 2016-09-14: qty 550

## 2016-09-14 MED ORDER — DIPHENHYDRAMINE HCL 25 MG PO CAPS: 25.0000 mg | ORAL_CAPSULE | Freq: Two times a day (BID) | ORAL | Status: DC | PRN

## 2016-09-14 MED ORDER — DEXAMETHASONE SODIUM PHOSPHATE 10 MG/ML IJ SOLN
10.0000 mg | Freq: Once | INTRAMUSCULAR | Status: AC
Start: 2016-09-14 — End: 2016-09-15
  Administered 2016-09-14: 10 mg via INTRAVENOUS

## 2016-09-14 MED ORDER — ACETAMINOPHEN 650 MG RE SUPP: 650.0000 mg | Freq: Four times a day (QID) | RECTAL | Status: DC | PRN

## 2016-09-14 MED ORDER — PHENOL 1.4 % MT LIQD: 1.0000 | OROMUCOSAL | Status: DC | PRN

## 2016-09-14 MED ORDER — DOCUSATE SODIUM 100 MG PO CAPS
100.0000 mg | ORAL_CAPSULE | Freq: Two times a day (BID) | ORAL | Status: DC
  Administered 2016-09-14 – 2016-09-15 (×2): 100 mg via ORAL
  Filled 2016-09-14 (×2): qty 1

## 2016-09-14 MED ORDER — SODIUM CHLORIDE 0.9 % IV SOLN
INTRAVENOUS | Status: DC
  Administered 2016-09-14 (×2): via INTRAVENOUS

## 2016-09-14 MED ORDER — PROPOFOL 500 MG/50ML IV EMUL
INTRAVENOUS | Status: DC | PRN
  Administered 2016-09-14: 75 ug/kg/min via INTRAVENOUS

## 2016-09-14 MED ORDER — BUPIVACAINE-EPINEPHRINE 0.25% -1:200000 IJ SOLN
INTRAMUSCULAR | Status: DC | PRN
  Administered 2016-09-14: 30 mL

## 2016-09-14 MED ORDER — CALCIUM CARB-CHOLECALCIFEROL 600-200 MG-UNIT PO TABS: ORAL_TABLET | Freq: Two times a day (BID) | ORAL | Status: DC

## 2016-09-14 MED ORDER — KETOROLAC TROMETHAMINE 30 MG/ML IJ SOLN
INTRAMUSCULAR | Status: DC | PRN
  Administered 2016-09-14: 30 mg

## 2016-09-14 MED ORDER — METHOCARBAMOL 500 MG PO TABS
500.0000 mg | ORAL_TABLET | Freq: Four times a day (QID) | ORAL | Status: DC | PRN
  Administered 2016-09-15: 500 mg via ORAL
  Filled 2016-09-14: qty 1

## 2016-09-14 MED ORDER — MIDAZOLAM HCL 2 MG/2ML IJ SOLN
2.0000 mg | Freq: Once | INTRAMUSCULAR | Status: AC
  Administered 2016-09-14: 2 mg via INTRAVENOUS

## 2016-09-14 MED ORDER — LIDOCAINE 2% (20 MG/ML) 5 ML SYRINGE
INTRAMUSCULAR | Status: DC | PRN
  Administered 2016-09-14: 40 mg via INTRAVENOUS

## 2016-09-14 MED ORDER — MEPERIDINE HCL 50 MG/ML IJ SOLN: 6.2500 mg | INTRAMUSCULAR | Status: DC | PRN

## 2016-09-14 MED ORDER — FERROUS SULFATE 325 (65 FE) MG PO TABS
325.0000 mg | ORAL_TABLET | Freq: Two times a day (BID) | ORAL | Status: DC
  Administered 2016-09-15: 10:00:00 325 mg via ORAL
  Filled 2016-09-14: qty 1

## 2016-09-14 MED ORDER — FAMOTIDINE 20 MG PO TABS
20.0000 mg | ORAL_TABLET | Freq: Every day | ORAL | Status: DC
  Administered 2016-09-14 – 2016-09-15 (×2): 20 mg via ORAL
  Filled 2016-09-14 (×2): qty 1

## 2016-09-14 SURGICAL SUPPLY — 64 items
ADH SKN CLS APL DERMABOND .7 (GAUZE/BANDAGES/DRESSINGS) ×1
BAG SPEC THK2 15X12 ZIP CLS (MISCELLANEOUS) ×1
BAG ZIPLOCK 12X15 (MISCELLANEOUS) ×3 IMPLANT
BANDAGE ACE 6X5 VEL STRL LF (GAUZE/BANDAGES/DRESSINGS) ×3 IMPLANT
BANDAGE ESMARK 6X9 LF (GAUZE/BANDAGES/DRESSINGS) ×1 IMPLANT
BIT DRILL 2.8X128 (BIT) ×2 IMPLANT
BIT DRILL 2.8X128MM (BIT) ×1
BLADE SAW SGTL 11.0X1.19X90.0M (BLADE) IMPLANT
BNDG CMPR 9X6 STRL LF SNTH (GAUZE/BANDAGES/DRESSINGS) ×1
BNDG ESMARK 6X9 LF (GAUZE/BANDAGES/DRESSINGS) ×3
COVER SURGICAL LIGHT HANDLE (MISCELLANEOUS) ×3 IMPLANT
CUFF TOURN SGL QUICK 34 (TOURNIQUET CUFF)
CUFF TRNQT CYL 34X4X40X1 (TOURNIQUET CUFF) IMPLANT
DERMABOND ADVANCED (GAUZE/BANDAGES/DRESSINGS) ×2
DERMABOND ADVANCED .7 DNX12 (GAUZE/BANDAGES/DRESSINGS) ×1 IMPLANT
DRAPE EXTREMITY T 121X128X90 (DRAPE) ×3 IMPLANT
DRAPE POUCH INSTRU U-SHP 10X18 (DRAPES) ×3 IMPLANT
DRAPE U-SHAPE 47X51 STRL (DRAPES) ×3 IMPLANT
DRSG AQUACEL AG ADV 3.5X10 (GAUZE/BANDAGES/DRESSINGS) ×3 IMPLANT
DRSG TEGADERM 4X4.75 (GAUZE/BANDAGES/DRESSINGS) ×3 IMPLANT
DURAPREP 26ML APPLICATOR (WOUND CARE) ×3 IMPLANT
ELECT REM PT RETURN 15FT ADLT (MISCELLANEOUS) ×3 IMPLANT
FACESHIELD WRAPAROUND (MASK) ×9 IMPLANT
FACESHIELD WRAPAROUND OR TEAM (MASK) ×3 IMPLANT
GAUZE SPONGE 2X2 8PLY STRL LF (GAUZE/BANDAGES/DRESSINGS) ×1 IMPLANT
GLOVE BIOGEL M 7.0 STRL (GLOVE) IMPLANT
GLOVE BIOGEL PI IND STRL 7.5 (GLOVE) ×1 IMPLANT
GLOVE BIOGEL PI IND STRL 8.5 (GLOVE) ×1 IMPLANT
GLOVE BIOGEL PI INDICATOR 7.5 (GLOVE) ×8
GLOVE BIOGEL PI INDICATOR 8.5 (GLOVE)
GLOVE ECLIPSE 8.0 STRL XLNG CF (GLOVE) ×1 IMPLANT
GLOVE ORTHO TXT STRL SZ7.5 (GLOVE) ×6 IMPLANT
GLOVE SURG ORTHO 8.0 STRL STRW (GLOVE) ×1 IMPLANT
GOWN STRL REUS W/TWL LRG LVL3 (GOWN DISPOSABLE) ×5 IMPLANT
GOWN STRL REUS W/TWL XL LVL3 (GOWN DISPOSABLE) ×2 IMPLANT
KIT BASIN OR (CUSTOM PROCEDURE TRAY) ×3 IMPLANT
MANIFOLD NEPTUNE II (INSTRUMENTS) ×3 IMPLANT
NDL MA TROC 1/2 CIR (NEEDLE) ×1 IMPLANT
NDL SPNL 18GX3.5 QUINCKE PK (NEEDLE) ×1 IMPLANT
NEEDLE MA TROC 1/2 CIR (NEEDLE) IMPLANT
NEEDLE SPNL 18GX3.5 QUINCKE PK (NEEDLE) IMPLANT
NS IRRIG 1000ML POUR BTL (IV SOLUTION) ×3 IMPLANT
PACK TOTAL JOINT (CUSTOM PROCEDURE TRAY) ×3 IMPLANT
PASSER SUT SWANSON 36MM LOOP (INSTRUMENTS) ×1 IMPLANT
POSITIONER SURGICAL ARM (MISCELLANEOUS) ×3 IMPLANT
SPONGE GAUZE 2X2 STER 10/PKG (GAUZE/BANDAGES/DRESSINGS)
SPONGE LAP 18X18 X RAY DECT (DISPOSABLE) ×1 IMPLANT
STAPLER VISISTAT 35W (STAPLE) ×1 IMPLANT
SUT ETHIBOND 2 OS 4 DA (SUTURE) IMPLANT
SUT ETHIBOND 5 LR DA (SUTURE) IMPLANT
SUT FIBERWIRE #2 38 T-5 BLUE (SUTURE)
SUT FIBERWIRE #5 38 BLUE (WIRE) ×6 IMPLANT
SUT MNCRL AB 4-0 PS2 18 (SUTURE) ×3 IMPLANT
SUT VIC AB 0 CT1 27 (SUTURE) ×6
SUT VIC AB 0 CT1 27XBRD ANTBC (SUTURE) ×2 IMPLANT
SUT VIC AB 1 CT1 36 (SUTURE) ×9 IMPLANT
SUT VIC AB 2-0 CT1 27 (SUTURE) ×9
SUT VIC AB 2-0 CT1 TAPERPNT 27 (SUTURE) ×3 IMPLANT
SUT VLOC 180 0 24IN GS25 (SUTURE) ×2 IMPLANT
SUTURE FIBERWR #2 38 T-5 BLUE (SUTURE) IMPLANT
SYR 50ML LL SCALE MARK (SYRINGE) ×3 IMPLANT
TOWEL OR 17X26 10 PK STRL BLUE (TOWEL DISPOSABLE) ×6 IMPLANT
WATER STERILE IRR 1500ML POUR (IV SOLUTION) ×1 IMPLANT
YANKAUER SUCT BULB TIP 10FT TU (MISCELLANEOUS) ×3 IMPLANT

## 2016-09-14 NOTE — Op Note (Signed)
[name], [name]                  ACCOUNT NO.:  [id number]  MEDICAL RECORD NO.:  [id number]  LOCATION:                                 FACILITY:  PHYSICIAN:  Pietro Cassis. Alvan Dame, M.D.       DATE OF BIRTH:  DATE OF PROCEDURE:  09/14/2016 DATE OF DISCHARGE:                              OPERATIVE REPORT   PREOPERATIVE DIAGNOSIS:  Right quadriceps tendon dehiscence around the superior medial aspect of the patella with palpable defect in this region.  POSTOPERATIVE DIAGNOSIS:  Right quadriceps tendon dehiscence around the superior medial aspect of the patella with palpable defect in this region.  PROCEDURE:  Open revision quadriceps tendon reapproximation on the superior medial aspect of the arthrotomy site.  FINDINGS:  The patient was noted to have attenuation of the quadriceps tendon in this area.  There was no frank dehiscence.  The extensor mechanism was intact, but noted to be loose in this area.  SURGEON:  Pietro Cassis. Alvan Dame, M.D.  ASSISTANT:  Surgical team.  ANESTHESIA:  Spinal.  SPECIMENS:  None.  COMPLICATIONS:  None.  Injection with Marcaine utilized.  TOURNIQUET:  Up for 14 minutes at 250 mmHg.  INDICATIONS FOR PROCEDURE:  Brittany [name] is a pleasant 71 year old female, who has done exceptionally well over the past 9 months with her right total knee replacement.  She presented to the office recently with some discomfort in a palpable area over the superior medial aspect of her joint.  She had retained full knee extension and flexion.  However, this area was aggravated and concerning to her.  There was some discussion in our office about the fact that the quadriceps tendon could be dehisced with some palpable underlying hardware.  Given these discussion, she wished to proceed with surgery to re- evaluate the extensor mechanism, make sure that everything was stable. I addressed the questions and concerns, she had regarding the activity level of hers in particular.  We  also discussed the potential by doing this procedure of eliminating this palpable dimple.  PROCEDURE IN DETAIL:  The patient was brought to the operative theater. Once adequate anesthesia and preoperative antibiotics, Ancef administered as well as Decadron, she was positioned supine with a right thigh tourniquet placed.  The right lower extremity was then prepped and draped in sterile fashion.  Time-out was performed identifying the patient, planned procedure, and extremity.  Leg was exsanguinated, tourniquet elevated to 250 mmHg.  Her old incision was incised.  Soft tissue planes created.  Once I got to the extensor mechanism, I did not see an obvious dehiscence or disruption of the extensor mechanism repair.  Instead, there was palpable attenuation of these tissues in this area.  Her vastus medialis obliquus contracted that was pink consistent with an intact and viable muscle despite these findings of the clinical presentation.  At this point, I did an arthrotomy sharply with a knife. I excised some underneath scar tissue, evacuated some seroma effusion in the knee, and then irrigated the knee with normal saline solution.  At this point, I reapproximated in a pants-over-vest fashion by shortening this distance as well as trying to overlap tissues to provide more  of a stable construct.  This was done with #1 Vicryl and then reinforced using #0 V-Loc suture.  Once this was completed, I injected the knee with 30 mL of 0.25% Marcaine with 30 mL of saline, and 1 mL of Toradol.  At this point, the remainder of the wound was closed with 2-0 Vicryl and running 4-0 Monocryl.  The knee was then cleaned, dried, and dressed sterilely using surgical glue and Aquacel dressing.  She was then brought to the recovery room in stable condition tolerating the procedure well.  Findings were reviewed with family, keep her in the hospital overnight for IV antibiotics.  Discharge in the  a.m.     Pietro Cassis Alvan Dame, M.D.     MDO/MEDQ  D:  09/14/2016  T:  09/14/2016  Job:  [id number]

## 2016-09-14 NOTE — Anesthesia Procedure Notes (Signed)
Procedure Name: MAC Date/Time: 09/14/2016 1:06 PM Performed by: Dione Booze Pre-anesthesia Checklist: Patient identified, Emergency Drugs available, Suction available and Patient being monitored Patient Re-evaluated:Patient Re-evaluated prior to induction Oxygen Delivery Method: Simple face mask Placement Confirmation: positive ETCO2

## 2016-09-14 NOTE — Progress Notes (Signed)
AssistedDr. Ossey with right, ultrasound guided, adductor canal block. Side rails up, monitors on throughout procedure. See vital signs in flow sheet. Tolerated Procedure well.  

## 2016-09-14 NOTE — Anesthesia Procedure Notes (Signed)
Spinal  Patient location during procedure: OR Start time: 09/14/2016 12:57 PM End time: 09/14/2016 1:00 PM Staffing Anesthesiologist: Lillia Abed Performed: anesthesiologist  Preanesthetic Checklist Completed: patient identified, surgical consent, pre-op evaluation, timeout performed, IV checked, risks and benefits discussed and monitors and equipment checked Spinal Block Patient position: sitting Prep: DuraPrep Patient monitoring: heart rate, cardiac monitor, continuous pulse ox and blood pressure Approach: right paramedian Location: L3-4 Injection technique: single-shot Needle Needle type: Pencan  Needle gauge: 24 G Needle length: 9 cm Needle insertion depth: 7 cm

## 2016-09-14 NOTE — Interval H&P Note (Signed)
History and Physical Interval Note:  09/14/2016 12:18 PM  Brittany [name]  has presented today for surgery, with the diagnosis of Right quad dehiscence  The various methods of treatment have been discussed with the patient and family. After consideration of risks, benefits and other options for treatment, the patient has consented to  Procedure(s): OPEN REPAIR QUADRICEP TENDON (Right) as a surgical intervention .  The patient's history has been reviewed, patient examined, no change in status, stable for surgery.  I have reviewed the patient's chart and labs.  Questions were answered to the patient's satisfaction.     Mauri Pole

## 2016-09-14 NOTE — Brief Op Note (Signed)
09/14/2016  1:21 PM  PATIENT:  Brittany [name]  70 y.o. 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   Delay start of Pharmacological VTE agent (>24hrs) due to surgical blood loss or risk of bleeding: no

## 2016-09-14 NOTE — Anesthesia Preprocedure Evaluation (Signed)
Anesthesia Evaluation  Patient identified by MRN, date of birth, ID band Patient awake    Reviewed: Allergy & Precautions, NPO status , Patient's Chart, lab work & pertinent test results  Airway Mallampati: I  TM Distance: >3 FB Neck ROM: Full    Dental   Pulmonary former smoker,    Pulmonary exam normal        Cardiovascular Normal cardiovascular exam     Neuro/Psych Anxiety Depression    GI/Hepatic GERD  Medicated and Controlled,  Endo/Other    Renal/GU      Musculoskeletal   Abdominal   Peds  Hematology   Anesthesia Other Findings   Reproductive/Obstetrics                             Anesthesia Physical Anesthesia Plan  ASA: II  Anesthesia Plan: Spinal   Post-op Pain Management:  Regional for Post-op pain   Induction: Intravenous  PONV Risk Score and Plan: 2 and Ondansetron, Dexamethasone and Treatment may vary due to age or medical condition  Airway Management Planned: Simple Face Mask  Additional Equipment:   Intra-op Plan:   Post-operative Plan:   Informed Consent: I have reviewed the patients History and Physical, chart, labs and discussed the procedure including the risks, benefits and alternatives for the proposed anesthesia with the patient or authorized representative who has indicated his/her understanding and acceptance.     Plan Discussed with: CRNA and Surgeon  Anesthesia Plan Comments:         Anesthesia Quick Evaluation

## 2016-09-14 NOTE — Transfer of Care (Signed)
Immediate Anesthesia Transfer of Care Note  Patient: Brittany [name]  Procedure(s) Performed: Procedure(s): OPEN REPAIR QUADRICEP TENDON (Right)  Patient Location: PACU  Anesthesia Type:MAC, Regional and Spinal  Level of Consciousness: awake, alert , oriented and patient cooperative  Airway & Oxygen Therapy: Patient Spontanous Breathing and Patient connected to face mask oxygen  Post-op Assessment: Report given to RN and Post -op Vital signs reviewed and stable  Post vital signs: Reviewed and stable  Last Vitals:  Vitals:   09/14/16 1130 09/14/16 1200  BP: (!) 116/54 101/64  Pulse: 72 72  Resp: 14 17  Temp:      Last Pain:  Vitals:   09/14/16 1003  TempSrc:   PainSc: 6       Patients Stated Pain Goal: 5 (29/92/42 6834)  Complications: No apparent anesthesia complications

## 2016-09-14 NOTE — Progress Notes (Signed)
PHARMACIST - PHYSICIAN ORDER COMMUNICATION  CONCERNING: P&T Medication Policy on Herbal Medications  DESCRIPTION:  This patient's order for:  Biotin  has been noted.  This product(s) is classified as an "herbal" or natural product. Due to a lack of definitive safety studies or FDA approval, nonstandard manufacturing practices, plus the potential risk of unknown drug-drug interactions while on inpatient medications, the Pharmacy and Therapeutics Committee does not permit the use of "herbal" or natural products of this type within [facility].   ACTION TAKEN: The pharmacy department is unable to verify this order at this time and your patient has been informed of this safety policy. Please reevaluate patient's clinical condition at discharge and address if the herbal or natural product(s) should be resumed at that time.  Dia Sitter, PharmD, BCPS 09/14/2016 4:09 PM

## 2016-09-14 NOTE — Anesthesia Procedure Notes (Signed)
Anesthesia Regional Block: Adductor canal block   Pre-Anesthetic Checklist: ,, timeout performed, Correct Patient, Correct Site, Correct Laterality, Correct Procedure, Correct Position, site marked, Risks and benefits discussed,  Surgical consent,  Pre-op evaluation,  At surgeon's request and post-op pain management  Laterality: Left  Prep: chloraprep       Needles:  Injection technique: Single-shot  Needle Type: Echogenic Stimulator Needle     Needle Length: 9cm  Needle Gauge: 21     Additional Needles:   Procedures: ultrasound guided,,,,,,,,  Narrative:  Start time: 09/14/2016 10:50 AM End time: 09/14/2016 11:00 AM Injection made incrementally with aspirations every 5 mL.  Performed by: Personally  Anesthesiologist: Lillia Abed  Additional Notes: Monitors applied. Patient sedated. Sterile prep and drape,hand hygiene and sterile gloves were used. Relevant anatomy identified.Needle position confirmed.Local anesthetic injected incrementally after negative aspiration. Local anesthetic spread visualized around nerve(s). Vascular puncture avoided. No complications. Image printed for medical record.The patient tolerated the procedure well.    Lillia Abed MD

## 2016-09-15 DIAGNOSIS — T8132XA Disruption of internal operation (surgical) wound, not elsewhere classified, initial encounter: Secondary | ICD-10-CM | POA: Diagnosis not present

## 2016-09-15 MED ORDER — DOCUSATE SODIUM 100 MG PO CAPS: 100.0000 mg | ORAL_CAPSULE | Freq: Two times a day (BID) | ORAL | 0 refills | Status: DC

## 2016-09-15 MED ORDER — POLYETHYLENE GLYCOL 3350 17 G PO PACK: 17.0000 g | PACK | Freq: Two times a day (BID) | ORAL | 0 refills | Status: DC

## 2016-09-15 MED ORDER — ASPIRIN 81 MG PO CHEW
81.0000 mg | CHEWABLE_TABLET | Freq: Two times a day (BID) | ORAL | 0 refills | Status: AC
Start: 2016-09-15 — End: 2016-10-15

## 2016-09-15 MED ORDER — METHOCARBAMOL 500 MG PO TABS: 500.0000 mg | ORAL_TABLET | Freq: Four times a day (QID) | ORAL | 0 refills | Status: DC | PRN

## 2016-09-15 MED ORDER — ACETAMINOPHEN 325 MG PO TABS: 650.0000 mg | ORAL_TABLET | Freq: Four times a day (QID) | ORAL | Status: DC | PRN

## 2016-09-15 MED ORDER — TRAMADOL HCL 50 MG PO TABS: 50.0000 mg | ORAL_TABLET | ORAL | 0 refills | Status: DC | PRN

## 2016-09-15 NOTE — Progress Notes (Signed)
Patient ID: Brittany [name], female   DOB: 01-Jan-1946, 71 y.o.   MRN: [id number] Subjective: 1 Day Post-Op Procedure(s) (LRB): OPEN REPAIR QUADRICEP TENDON (Right)    Patient reports pain as mild.  Did well over night.  Reviewed intra-operative findings  Objective:   VITALS:   Vitals:   09/15/16 0231 09/15/16 0612  BP: (!) 115/52 (!) 124/59  Pulse: 72 80  Resp: 16 17  Temp: 97.8 F (36.6 C) 97.8 F (36.6 C)    Neurovascular intact Incision: dressing C/D/I  LABS No results for input(s): HGB, HCT, WBC, PLT in the last 72 hours.  No results for input(s): NA, K, BUN, CREATININE, GLUCOSE in the last 72 hours.  No results for input(s): LABPT, INR in the last 72 hours.   Assessment/Plan: 1 Day Post-Op Procedure(s) (LRB): OPEN REPAIR QUADRICEP TENDON (Right)   Advance diet  Home this am Activity as tolerable Knows therapy exercises  RTC in 2 weeks

## 2016-09-15 NOTE — Anesthesia Postprocedure Evaluation (Signed)
Anesthesia Post Note  Patient: Brittany [name]  Procedure(s) Performed: Procedure(s) (LRB): OPEN REPAIR QUADRICEP TENDON (Right)     Patient location during evaluation: PACU Anesthesia Type: Spinal Level of consciousness: oriented and awake and alert Pain management: pain level controlled Vital Signs Assessment: post-procedure vital signs reviewed and stable Respiratory status: spontaneous breathing, respiratory function stable and patient connected to nasal cannula oxygen Cardiovascular status: blood pressure returned to baseline and stable Postop Assessment: no headache and no backache Anesthetic complications: no    Last Vitals:  Vitals:   09/15/16 0612 09/15/16 0943  BP: (!) 124/59 (!) 104/59  Pulse: 80 95  Resp: 17 16  Temp: 36.6 C 36.8 C    Last Pain:  Vitals:   09/15/16 1320  TempSrc:   PainSc: 3                  [name] DAVID

## 2016-09-15 NOTE — Evaluation (Signed)
Physical Therapy Evaluation Patient Details Name: Brittany [name] MRN: [id number] DOB: [birth year] Today's Date: 09/15/2016   History of Present Illness  Pt is a 71 year old female s/p right quadriceps tendon repair with hx of R TKA and breast cancer  Clinical Impression  Patient evaluated by Physical Therapy with no further acute PT needs identified. All education has been completed and the patient has no further questions.  Pt reports OOB with BSC this morning with nursing and states her LEs were buckling and she felt unsteady.  Discussed KI with RN for pt safety and ortho tech brought KI to room.  Pt educated on don/doff of KI and to wear with mobility until further instructed by MD.  Pt also educated to maintain more extension in R knee and avoid flexion.  KI applied prior to standing and ambulating and pt reports feeling more confident and appeared steady.  See below for any follow-up Physical Therapy or equipment needs. PT is signing off. Thank you for this referral.     Follow Up Recommendations No PT follow up    Equipment Recommendations  Rolling walker with 5" wheels    Recommendations for Other Services       Precautions / Restrictions Precautions Precautions: Fall;Knee Required Braces or Orthoses: Knee Immobilizer - Right Restrictions Other Position/Activity Restrictions: WBAT      Mobility  Bed Mobility Overal bed mobility: Modified Independent                Transfers Overall transfer level: Needs assistance Equipment used: Rolling walker (2 wheeled) Transfers: Sit to/from Stand Sit to Stand: Min guard         General transfer comment: min/guard for safety due to reports of buckling when OOB with nursing earlier, pt performed well  Ambulation/Gait Ambulation/Gait assistance: Min guard Ambulation Distance (Feet): 160 Feet Assistive device: Rolling walker (2 wheeled) Gait Pattern/deviations: Step-through pattern;Decreased stride length     General Gait  Details: pt ambulated with KI and had no knee buckling during gait, pt reports feeling more secure  Stairs Stairs: Yes Stairs assistance: Min guard Stair Management: Two rails;Step to pattern;Forwards Number of Stairs: 3 General stair comments: pt recalled sequence and performed well  Wheelchair Mobility    Modified Rankin (Stroke Patients Only)       Balance                                             Pertinent Vitals/Pain Pain Assessment: 0-10 Pain Score: 2  Pain Location: R knee Pain Descriptors / Indicators: Sore Pain Intervention(s): Premedicated before session;Monitored during session;Limited activity within patient's tolerance;Repositioned    Home Living Family/patient expects to be discharged to:: Private residence Living Arrangements: Spouse/significant other Available Help at Discharge: Family Type of Home: House Home Access: Stairs to enter Entrance Stairs-Rails: Psychiatric nurse of Steps: 3-4 Home Layout: One level Home Equipment: [place] - single point;Bedside commode      Prior Function Level of Independence: Independent               Hand Dominance        Extremity/Trunk Assessment        Lower Extremity Assessment Lower Extremity Assessment: RLE deficits/detail RLE Deficits / Details: able to perform SLR, pt educated to maintain extension and limit flexion s/p quad tendon repair       Communication  Communication: No difficulties  Cognition Arousal/Alertness: Awake/alert Behavior During Therapy: WFL for tasks assessed/performed Overall Cognitive Status: Within Functional Limits for tasks assessed                                        General Comments      Exercises     Assessment/Plan    PT Assessment Patent does not need any further PT services  PT Problem List         PT Treatment Interventions      PT Goals (Current goals can be found in the Care Plan section)   Acute Rehab PT Goals PT Goal Formulation: All assessment and education complete, DC therapy    Frequency     Barriers to discharge        Co-evaluation               AM-PAC PT "6 Clicks" Daily Activity  Outcome Measure Difficulty turning over in bed (including adjusting bedclothes, sheets and blankets)?: None Difficulty moving from lying on back to sitting on the side of the bed? : None Difficulty sitting down on and standing up from a chair with arms (e.g., wheelchair, bedside commode, etc,.)?: None Help needed moving to and from a bed to chair (including a wheelchair)?: A Little Help needed walking in hospital room?: A Little Help needed climbing 3-5 steps with a railing? : A Little 6 Click Score: 21    End of Session Equipment Utilized During Treatment: Gait belt;Right knee immobilizer Activity Tolerance: Patient tolerated treatment well Patient left: in bed;with call bell/phone within reach Nurse Communication: Mobility status PT Visit Diagnosis: Difficulty in walking, not elsewhere classified (R26.2)    Time: 5093-2671 PT Time Calculation (min) (ACUTE ONLY): 19 min   Charges:   PT Evaluation $PT Eval Low Complexity: 1 Procedure     PT G Codes:   PT G-Codes **NOT FOR INPATIENT CLASS** Functional Assessment Tool Used: AM-PAC 6 Clicks Basic Mobility;Clinical judgement Functional Limitation: Mobility: Walking and moving around Mobility: Walking and Moving Around Current Status (I4580): At least 1 percent but less than 20 percent impaired, limited or restricted Mobility: Walking and Moving Around Goal Status [phone]): At least 1 percent but less than 20 percent impaired, limited or restricted Mobility: Walking and Moving Around Discharge Status [phone]): At least 1 percent but less than 20 percent impaired, limited or restricted    Carmelia Bake, PT, DPT 09/15/2016 Pager: [phone]  York Ram E 09/15/2016, 1:04 PM

## 2016-09-15 NOTE — Progress Notes (Signed)
[facility] rep alerted of order for RW. Jackye Dever RN,BSN,NCM [phone] 

## 2016-09-20 ENCOUNTER — Other Ambulatory Visit: Payer: Self-pay | Admitting: Internal Medicine

## 2016-09-27 ENCOUNTER — Other Ambulatory Visit: Payer: Self-pay | Admitting: Internal Medicine

## 2016-09-28 NOTE — Telephone Encounter (Signed)
Please advise in Dr. Enis Slipper absence

## 2016-12-09 ENCOUNTER — Ambulatory Visit: Payer: PPO

## 2016-12-24 ENCOUNTER — Other Ambulatory Visit: Payer: Self-pay | Admitting: Internal Medicine

## 2017-01-11 ENCOUNTER — Encounter: Payer: Self-pay | Admitting: Internal Medicine

## 2017-01-13 MED ORDER — ZOSTER VAC RECOMB ADJUVANTED 50 MCG/0.5ML IM SUSR: 0.5000 mL | Freq: Once | INTRAMUSCULAR | 1 refills | Status: AC

## 2017-02-11 NOTE — Progress Notes (Addendum)
Subjective:   Brittany [name] is a 71 y.o. female who presents for an Initial Medicare Annual Wellness Visit.  Review of Systems    No ROS.  Medicare Wellness Visit. Additional risk factors are reflected in the social history.  Cardiac Risk Factors include: advanced age (>54men, >54 women);dyslipidemia Sleep patterns: feels rested on waking, gets up 1 times nightly to void and sleeps 6-8 hours nightly.    Home Safety/Smoke Alarms: Feels safe in home. Smoke alarms in place.  Living environment; residence and Firearm Safety: 2-story house, no firearms. Lives with husband, no needs for DME, good support system Seat Belt Safety/Bike Helmet: Wears seat belt.    Objective:    Today's Vitals   02/12/17 1056  BP: 118/63  Pulse: 68  Resp: 20  SpO2: 98%  Weight: 127 lb (57.6 kg)  Height: 5\' 7"  (1.702 m)   Body mass index is 19.89 kg/m.  Advanced Directives 02/12/2017 09/14/2016 09/08/2016 07/21/2016 01/29/2016 12/31/2015 12/26/2015  Does Patient Have a Medical Advance Directive? Yes Yes Yes Yes No No Yes  Type of Paramedic of [place];Living will [place];Living will [place];Living will Living will - - Living will  Does patient want to make changes to medical advance directive? - No - Patient declined No - Patient declined - - - No - Patient declined  Copy of [place] in Chart? No - copy requested No - copy requested - - - - No - copy requested  Would patient like information on creating a medical advance directive? - - - - No - Patient declined No - patient declined information -    Current Medications (verified) Outpatient Encounter Medications as of 02/12/2017  Medication Sig  . acetaminophen (TYLENOL) 325 MG tablet Take 2 tablets (650 mg total) by mouth every 6 (six) hours as needed for mild pain (or Fever >/= 101).  . Ascorbic Acid (VITAMIN C PO) Take 1 tablet by mouth daily.  . Biotin 1000 MCG  tablet Take 1,000 mcg by mouth daily.  . Calcium Carb-Cholecalciferol (CALCIUM 600 + D PO) Take 1 tablet by mouth 2 (two) times daily.  . Cholecalciferol (VITAMIN D3) 1000 units CAPS Take 1,000 Units by mouth daily.  . citalopram (CELEXA) 40 MG tablet TAKE ONE TABLET BY MOUTH DAILY.  [name] denosumab (PROLIA) 60 MG/ML SOLN injection Inject 60 mg into the skin once. Administer in upper arm, thigh, or abdomen (Patient taking differently: Inject 60 mg into the skin every 6 (six) months. Administer in upper arm, thigh, or abdomen)  . diphenhydrAMINE (BENADRYL) 25 MG tablet Take 25 mg by mouth 2 (two) times daily as needed for allergies.   [name] docusate sodium (COLACE) 100 MG capsule Take 1 capsule (100 mg total) by mouth 2 (two) times daily.  . ferrous sulfate 325 (65 FE) MG tablet Take 1 tablet (325 mg total) by mouth 2 (two) times daily with a meal.  . Omega-3 Fatty Acids (FISH OIL) 1200 MG CAPS Take 1,200 mg by mouth daily.   . polyethylene glycol (MIRALAX / GLYCOLAX) packet Take 17 g by mouth 2 (two) times daily.  . ranitidine (ZANTAC) 150 MG tablet TAKE 1 TABLET (150 MG TOTAL) BY MOUTH 2 (TWO) TIMES DAILY.  . SUMAtriptan (IMITREX) 100 MG tablet Take 1 tablet (100 mg total) by mouth once. May repeat in 2 hours if headache persists or recurs.  . traMADol (ULTRAM) 50 MG tablet Take 1-2 tablets (50-100 mg total) by  mouth every 4 (four) hours as needed for moderate pain.  . traZODone (DESYREL) 50 MG tablet Take 0.5-1 tablets (25-50 mg total) by mouth at bedtime as needed. for sleep Follow-up appt is due must see Md for refills (Patient taking differently: Take 50 mg by mouth at bedtime as needed for sleep. for sleep Follow-up appt is due must see Md for refills)  . vitamin B-12 (CYANOCOBALAMIN) 1000 MCG tablet Take 1,000 mcg by mouth daily.  [name] zolpidem (AMBIEN) 10 MG tablet TAKE 1 TABLET BY MOUTH AT BEDTIME AS NEEDED FOR SLEEP  . [DISCONTINUED] methocarbamol (ROBAXIN) 500 MG tablet Take 1 tablet (500 mg total)  by mouth every 6 (six) hours as needed for muscle spasms. (Patient not taking: Reported on 02/12/2017)   No facility-administered encounter medications on file as of 02/12/2017.     Allergies (verified) Boniva [ibandronic acid] and Lipitor [atorvastatin]   History: Past Medical History:  Diagnosis Date  . Acid reflux   . Anemia   . Arthritis   . Breast cancer ([place]) 11/23/2007   right, ER/PR -, HER 2 -  . Cancer [facility])    breast right  triple recet neg, lumpect, chemo, XRT  . Esophageal stricture   . Gastritis 10/2007  . History of chemotherapy    neoadjuvant chemo, last dose 03/2008  . History of radiation therapy 07/25/08 -09/11/08   right breast  . Hx of colonic polyps 08/11/2016  . Hyperlipidemia   . Migraines    rare  . Osteopenia    Past Surgical History:  Procedure Laterality Date  . BREAST LUMPECTOMY  06/26/2008   right, CA   XRT, chemo  . COLONOSCOPY    . EYE SURGERY     left eye cataract surgery 12/23/2015  . growth removed per right thigh    . JOINT REPLACEMENT  12/31/2015   RTK  . QUADRICEPS TENDON REPAIR Right 09/14/2016   Procedure: OPEN REPAIR QUADRICEP TENDON;  Surgeon: [name], MD;  Location: [facility];  Service: Orthopedics;  Laterality: Right;  . TOTAL KNEE ARTHROPLASTY Right 12/31/2015   Procedure: RIGHT TOTAL KNEE ARTHROPLASTY;  Surgeon: [name], MD;  Location: [facility];  Service: Orthopedics;  Laterality: Right;  . UPPER GASTROINTESTINAL ENDOSCOPY     Family History  Problem Relation Age of Onset  . Heart disease Father   . Cancer Father        lung  . Cancer Sister 50       Breast & Colorectal  . Epilepsy Mother   . Dementia Mother    Social History   Socioeconomic History  . Marital status: Married    Spouse name: None  . Number of children: 3  . Years of education: None  . Highest education level: None  Social Needs  . Financial resource strain: Not hard at all  . Food insecurity - worry: Never true  . Food insecurity -  inability: Never true  . Transportation needs - medical: No  . Transportation needs - non-medical: No  Occupational History    Employer: RETIRED  Tobacco Use  . Smoking status: Former Smoker    Packs/day: 0.25    Years: 5.00    Pack years: 1.25    Types: Cigarettes    Last attempt to quit: 03/03/1967    Years since quitting: 49.9  . Smokeless tobacco: Never Used  . Tobacco comment: 6 cig per day when she was smoking/quit years ago  Substance and Sexual Activity  . Alcohol use: Yes  Alcohol/week: 4.2 oz    Types: 7 Cans of beer per week    Comment: 1 beer per day   . Drug use: No  . Sexual activity: Yes    Partners: Male    Birth control/protection: Post-menopausal  Other Topics Concern  . None  Social History Narrative   G3, P3, 1st pregnancy age 40, menopause age 42, no HRT    Tobacco Counseling Counseling given: Not Answered Comment: 6 cig per day when she was smoking/quit years ago   Activities of Daily Living In your present state of health, do you have any difficulty performing the following activities: 02/12/2017 09/14/2016  Hearing? N N  Vision? N N  Difficulty concentrating or making decisions? N N  Walking or climbing stairs? N N  Dressing or bathing? N N  Doing errands, shopping? N N  Preparing Food and eating ? N -  Using the Toilet? N -  In the past six months, have you accidently leaked urine? N -  Do you have problems with loss of bowel control? N -  Managing your Medications? N -  Managing your Finances? N -  Housekeeping or managing your Housekeeping? N -  Some recent data might be hidden     Immunizations and Health Maintenance Immunization History  Administered Date(s) Administered  . Influenza Split 12/01/2011  . Influenza Whole 12/06/2007, 11/15/2008, 01/06/2010, 11/11/2010  . Influenza, High Dose Seasonal PF 12/20/2012, 11/20/2014  . Influenza,inj,Quad PF,6+ Mos 11/30/2013  . Influenza-Unspecified 11/27/2015  . Pneumococcal  Conjugate-13 03/22/2013  . Pneumococcal Polysaccharide-23 01/31/2009, 07/31/2015  . Rabies, IM 03/04/2015, 03/07/2015, 03/11/2015, 03/18/2015  . Td 10/09/2009  . Tdap 03/04/2015  . Zoster 04/18/2009   Health Maintenance Due  Topic Date Due  . INFLUENZA VACCINE  09/30/2016    Patient Care Team: Cassandria Anger, MD as PCP - General [name], [name], MD as Consulting Physician (Hematology and Oncology) Neldon Mc, MD as Surgeon (General Surgery) Thea Silversmith, MD (Inactive) as Consulting Physician (Radiation Oncology) Holley Bouche, NP as Nurse Practitioner (Nurse Practitioner)      Assessment:   This is a routine wellness examination for [place]. Physical assessment deferred to PCP.   Hearing/Vision screen Hearing Screening Comments: Able to hear conversational tones w/o difficulty. No issues reported.  Passed whisper test Vision Screening Comments: appointment yearly Dr. Katy Fitch  Dietary issues and exercise activities discussed: Current Exercise Habits: Structured exercise class, Type of exercise: walking;strength training/weights;calisthenics, Time (Minutes): 45, Frequency (Times/Week): 3, Weekly Exercise (Minutes/Week): 135, Intensity: Mild, Exercise limited by: orthopedic condition(s)  Diet (meal preparation, eat out, water intake, caffeinated beverages, dairy products, fruits and vegetables): in general, a "healthy" diet  , well balanced, eats a variety of fruits and vegetables daily, limits salt, fat/cholesterol, sugar, caffeine, drinks 6-8 glasses of water daily.    Goals    . Patient Stated     Maintain current health status by continuing to exercise and eat well, enjoy family and life.      Depression Screen PHQ 2/9 Scores 02/12/2017 07/28/2016  PHQ - 2 Score 0 0  PHQ- 9 Score 0 -    Fall Risk Fall Risk  02/12/2017 07/28/2016 09/25/2014  Falls in the past year? Yes No No  Number falls in past yr: 1 - -  Injury with Fall? No - -   Cognitive  Function: MMSE - Mini Mental State Exam 02/12/2017  Orientation to time 5  Orientation to Place 5  Registration 3  Attention/ Calculation 5  Recall 3  Language- name 2 objects 2  Language- repeat 1  Language- follow 3 step command 3  Language- read & follow direction 1  Write a sentence 1  Copy design 1  Total score 30        Screening Tests Health Maintenance  Topic Date Due  . INFLUENZA VACCINE  09/30/2016  . MAMMOGRAM  05/14/2018  . COLONOSCOPY  08/05/2019  . TETANUS/TDAP  03/03/2025  . DEXA SCAN  Completed  . Hepatitis C Screening  Completed  . PNA vac Low Risk Adult  Completed     Plan:     Shingrix vaccine prescription sent to patient's pharmacy.  Continue doing brain stimulating activities (puzzles, reading, adult coloring books, staying active) to keep memory sharp.   Continue to eat heart healthy diet (full of fruits, vegetables, whole grains, lean protein, water--limit salt, fat, and sugar intake) and increase physical activity as tolerated.  I have personally reviewed and noted the following in the patient's chart:   . Medical and social history . Use of alcohol, tobacco or illicit drugs  . Current medications and supplements . Functional ability and status . Nutritional status . Physical activity . Advanced directives . List of other physicians . Vitals . Screenings to include cognitive, depression, and falls . Referrals and appointments  In addition, I have reviewed and discussed with patient certain preventive protocols, quality metrics, and best practice recommendations. A written personalized care plan for preventive services as well as general preventive health recommendations were provided to patient.     Michiel Cowboy, RN   02/12/2017    Medical screening examination/treatment/procedure(s) were performed by non-physician practitioner and as supervising physician I was immediately available for consultation/collaboration. I agree with above.  Binnie Rail, MD

## 2017-02-12 ENCOUNTER — Ambulatory Visit (INDEPENDENT_AMBULATORY_CARE_PROVIDER_SITE_OTHER): Payer: PPO | Admitting: *Deleted

## 2017-02-12 VITALS — BP 118/63 | HR 68 | Resp 20 | Ht 67.0 in | Wt 127.0 lb

## 2017-02-12 DIAGNOSIS — Z0000 Encounter for general adult medical examination without abnormal findings: Secondary | ICD-10-CM

## 2017-02-12 MED ORDER — ZOSTER VAC RECOMB ADJUVANTED 50 MCG/0.5ML IM SUSR: 0.5000 mL | Freq: Once | INTRAMUSCULAR | 1 refills | Status: AC

## 2017-02-12 NOTE — Patient Instructions (Signed)
Continue doing brain stimulating activities (puzzles, reading, adult coloring books, staying active) to keep memory sharp.   Continue to eat heart healthy diet (full of fruits, vegetables, whole grains, lean protein, water--limit salt, fat, and sugar intake) and increase physical activity as tolerated.  Brittany [name] , Thank you for taking time to come for your Medicare Wellness Visit. I appreciate your ongoing commitment to your health goals. Please review the following plan we discussed and let me know if I can assist you in the future.   These are the goals we discussed: Goals    . Patient Stated     Maintain current health status by continuing to exercise and eat well, enjoy family and life.       This is a list of the screening recommended for you and due dates:  Health Maintenance  Topic Date Due  . Flu Shot  09/30/2016  . Mammogram  05/14/2018  . Colon Cancer Screening  08/05/2019  . Tetanus Vaccine  03/03/2025  . DEXA scan (bone density measurement)  Completed  .  Hepatitis C: One time screening is recommended by Center for Disease Control  (CDC) for  adults born from [birth year] through 1963.   Completed  . Pneumonia vaccines  Completed

## 2017-02-15 ENCOUNTER — Other Ambulatory Visit: Payer: Self-pay

## 2017-02-15 DIAGNOSIS — C50811 Malignant neoplasm of overlapping sites of right female breast: Secondary | ICD-10-CM

## 2017-02-15 DIAGNOSIS — Z170 Estrogen receptor positive status [ER+]: Principal | ICD-10-CM

## 2017-02-16 ENCOUNTER — Other Ambulatory Visit (HOSPITAL_BASED_OUTPATIENT_CLINIC_OR_DEPARTMENT_OTHER): Payer: PPO

## 2017-02-16 ENCOUNTER — Ambulatory Visit (HOSPITAL_BASED_OUTPATIENT_CLINIC_OR_DEPARTMENT_OTHER): Payer: PPO

## 2017-02-16 VITALS — BP 100/66 | HR 84 | Temp 98.0°F | Resp 18

## 2017-02-16 DIAGNOSIS — C50811 Malignant neoplasm of overlapping sites of right female breast: Secondary | ICD-10-CM

## 2017-02-16 DIAGNOSIS — M810 Age-related osteoporosis without current pathological fracture: Secondary | ICD-10-CM

## 2017-02-16 DIAGNOSIS — Z170 Estrogen receptor positive status [ER+]: Principal | ICD-10-CM

## 2017-02-16 LAB — COMPREHENSIVE METABOLIC PANEL
ALT: 7 U/L (ref 0–55)
ANION GAP: 7 meq/L (ref 3–11)
AST: 17 U/L (ref 5–34)
Albumin: 3.6 g/dL (ref 3.5–5.0)
Alkaline Phosphatase: 56 U/L (ref 40–150)
BUN: 10.4 mg/dL (ref 7.0–26.0)
CO2: 26 meq/L (ref 22–29)
Calcium: 9.2 mg/dL (ref 8.4–10.4)
Chloride: 102 mEq/L (ref 98–109)
Creatinine: 0.7 mg/dL (ref 0.6–1.1)
GLUCOSE: 83 mg/dL (ref 70–140)
Potassium: 4.1 mEq/L (ref 3.5–5.1)
SODIUM: 136 meq/L (ref 136–145)
Total Bilirubin: 0.34 mg/dL (ref 0.20–1.20)
Total Protein: 6.7 g/dL (ref 6.4–8.3)

## 2017-02-16 LAB — CBC WITH DIFFERENTIAL/PLATELET
BASO%: 0.6 % (ref 0.0–2.0)
BASOS ABS: 0 10*3/uL (ref 0.0–0.1)
EOS%: 7.1 % — AB (ref 0.0–7.0)
Eosinophils Absolute: 0.3 10*3/uL (ref 0.0–0.5)
HEMATOCRIT: 34.7 % — AB (ref 34.8–46.6)
HEMOGLOBIN: 11.4 g/dL — AB (ref 11.6–15.9)
LYMPH#: 1.3 10*3/uL (ref 0.9–3.3)
LYMPH%: 27.3 % (ref 14.0–49.7)
MCH: 31.6 pg (ref 25.1–34.0)
MCHC: 32.9 g/dL (ref 31.5–36.0)
MCV: 96.1 fL (ref 79.5–101.0)
MONO#: 0.5 10*3/uL (ref 0.1–0.9)
MONO%: 10.3 % (ref 0.0–14.0)
NEUT#: 2.6 10*3/uL (ref 1.5–6.5)
NEUT%: 54.7 % (ref 38.4–76.8)
PLATELETS: 250 10*3/uL (ref 145–400)
RBC: 3.61 10*6/uL — ABNORMAL LOW (ref 3.70–5.45)
RDW: 13 % (ref 11.2–14.5)
WBC: 4.8 10*3/uL (ref 3.9–10.3)

## 2017-02-16 MED ORDER — DENOSUMAB 60 MG/ML ~~LOC~~ SOLN
60.0000 mg | Freq: Once | SUBCUTANEOUS | Status: AC
  Administered 2017-02-16: 60 mg via SUBCUTANEOUS

## 2017-02-16 NOTE — Patient Instructions (Signed)

## 2017-03-04 ENCOUNTER — Telehealth: Payer: Self-pay | Admitting: Family

## 2017-03-10 MED ORDER — ZOLPIDEM TARTRATE 10 MG PO TABS: 10.0000 mg | ORAL_TABLET | Freq: Every evening | ORAL | 0 refills | Status: DC | PRN

## 2017-03-10 NOTE — Telephone Encounter (Signed)
Ok x30 d OV q 6 mo Thx

## 2017-03-10 NOTE — Telephone Encounter (Signed)
Pt is requesting refill on her Zolpidem. Check [place] registry last filled 09/29/2016 pls advise...Johny Chess

## 2017-03-10 NOTE — Telephone Encounter (Signed)
Pt called - pharmacy told her that she needed to call  Before getting refill. Cb# [phone]

## 2017-03-10 NOTE — Telephone Encounter (Addendum)
Called pt no answer LMOM MD sent refill to p[facility]..[facility] spoke w/pharmacist [name] gave md approval.updated epic.Marland KitchenJohny Chess

## 2017-03-10 NOTE — Addendum Note (Signed)
Addended by: Earnstine Regal on: 03/10/2017 03:05 PM   Modules accepted: Orders

## 2017-03-12 ENCOUNTER — Telehealth: Payer: Self-pay | Admitting: Internal Medicine

## 2017-03-12 NOTE — Telephone Encounter (Signed)
Refill was called in on Wednesday, 03/10/17 we spoke w/[name] and gave renewal to her. Called pharmacy spoke w/[name] she states through her insurance it was requiring a PA. She had check on how she paid for it on her last refill, and she used a discount card. She ran script through discount card which she can get through for $13. Notified pt w/status...Brittany [name]

## 2017-03-12 NOTE — Telephone Encounter (Signed)
Copied from [place] [phone]. Topic: Quick Communication - See Telephone Encounter >> Mar 12, 2017  9:07 AM Ahmed Prima L wrote: CRM for notification. See Telephone encounter for:   03/12/17.  Pt wants to know if the request for zolpidem (AMBIEN) 10 MG tablet could be re-sent. She said the pharmacy does not have it.[name]

## 2017-03-23 DIAGNOSIS — S76119A Strain of unspecified quadriceps muscle, fascia and tendon, initial encounter: Secondary | ICD-10-CM | POA: Insufficient documentation

## 2017-03-23 HISTORY — DX: Strain of unspecified quadriceps muscle, fascia and tendon, initial encounter: S76.119A

## 2017-03-25 DIAGNOSIS — Z9889 Other specified postprocedural states: Secondary | ICD-10-CM | POA: Diagnosis not present

## 2017-03-25 DIAGNOSIS — Z96651 Presence of right artificial knee joint: Secondary | ICD-10-CM | POA: Diagnosis not present

## 2017-03-25 DIAGNOSIS — M25561 Pain in right knee: Secondary | ICD-10-CM | POA: Diagnosis not present

## 2017-03-31 ENCOUNTER — Other Ambulatory Visit: Payer: Self-pay

## 2017-03-31 MED ORDER — RANITIDINE HCL 150 MG PO TABS: 150.0000 mg | ORAL_TABLET | Freq: Two times a day (BID) | ORAL | 0 refills | Status: DC

## 2017-04-02 ENCOUNTER — Other Ambulatory Visit (HOSPITAL_COMMUNITY): Payer: Self-pay | Admitting: Orthopedic Surgery

## 2017-04-02 DIAGNOSIS — M25561 Pain in right knee: Secondary | ICD-10-CM

## 2017-04-06 ENCOUNTER — Other Ambulatory Visit: Payer: Self-pay | Admitting: Internal Medicine

## 2017-04-09 ENCOUNTER — Encounter (HOSPITAL_COMMUNITY)
Admission: RE | Admit: 2017-04-09 | Discharge: 2017-04-09 | Disposition: A | Discharge status: Home / Self Care | Payer: PPO | Source: Ambulatory Visit | Attending: Orthopedic Surgery | Admitting: Orthopedic Surgery

## 2017-04-09 DIAGNOSIS — Z471 Aftercare following joint replacement surgery: Secondary | ICD-10-CM | POA: Diagnosis not present

## 2017-04-09 DIAGNOSIS — M25561 Pain in right knee: Secondary | ICD-10-CM | POA: Insufficient documentation

## 2017-04-09 DIAGNOSIS — Z96651 Presence of right artificial knee joint: Secondary | ICD-10-CM | POA: Diagnosis not present

## 2017-04-09 MED ORDER — TECHNETIUM TC 99M MEDRONATE IV KIT
25.0000 | PACK | Freq: Once | INTRAVENOUS | Status: AC | PRN
  Administered 2017-04-09: 25 via INTRAVENOUS

## 2017-05-25 DIAGNOSIS — M8589 Other specified disorders of bone density and structure, multiple sites: Secondary | ICD-10-CM | POA: Diagnosis not present

## 2017-05-25 DIAGNOSIS — Z1231 Encounter for screening mammogram for malignant neoplasm of breast: Secondary | ICD-10-CM | POA: Diagnosis not present

## 2017-05-25 DIAGNOSIS — Z853 Personal history of malignant neoplasm of breast: Secondary | ICD-10-CM | POA: Diagnosis not present

## 2017-05-25 LAB — HM DEXA SCAN

## 2017-05-31 ENCOUNTER — Other Ambulatory Visit: Payer: Self-pay | Admitting: Internal Medicine

## 2017-06-17 ENCOUNTER — Encounter: Payer: Self-pay | Admitting: Internal Medicine

## 2017-06-17 ENCOUNTER — Other Ambulatory Visit: Payer: Self-pay

## 2017-06-17 ENCOUNTER — Other Ambulatory Visit: Payer: Self-pay | Admitting: Internal Medicine

## 2017-06-17 ENCOUNTER — Ambulatory Visit (INDEPENDENT_AMBULATORY_CARE_PROVIDER_SITE_OTHER): Payer: PPO | Admitting: Internal Medicine

## 2017-06-17 ENCOUNTER — Other Ambulatory Visit (INDEPENDENT_AMBULATORY_CARE_PROVIDER_SITE_OTHER): Payer: PPO

## 2017-06-17 VITALS — BP 128/80 | HR 63 | Temp 98.1°F | Ht 67.0 in | Wt 125.0 lb

## 2017-06-17 DIAGNOSIS — R079 Chest pain, unspecified: Secondary | ICD-10-CM

## 2017-06-17 LAB — COMPREHENSIVE METABOLIC PANEL
ALT: 7 U/L (ref 0–35)
AST: 15 U/L (ref 0–37)
Albumin: 4.2 g/dL (ref 3.5–5.2)
Alkaline Phosphatase: 44 U/L (ref 39–117)
BILIRUBIN TOTAL: 0.4 mg/dL (ref 0.2–1.2)
BUN: 18 mg/dL (ref 6–23)
CALCIUM: 9.5 mg/dL (ref 8.4–10.5)
CO2: 29 mEq/L (ref 19–32)
CREATININE: 0.72 mg/dL (ref 0.40–1.20)
Chloride: 100 mEq/L (ref 96–112)
GFR: 85.16 mL/min (ref >60.00)
Glucose, Bld: 118 mg/dL — ABNORMAL HIGH (ref 70–99)
Potassium: 3.7 mEq/L (ref 3.5–5.1)
SODIUM: 136 meq/L (ref 135–145)
TOTAL PROTEIN: 6.9 g/dL (ref 6.0–8.3)

## 2017-06-17 LAB — CBC
HCT: 37.1 % (ref 36.0–46.0)
Hemoglobin: 12.7 g/dL (ref 12.0–15.0)
MCHC: 34.1 g/dL (ref 30.0–36.0)
MCV: 94.2 fl (ref 78.0–100.0)
Platelets: 306 10*3/uL (ref 150.0–400.0)
RBC: 3.94 Mil/uL (ref 3.87–5.11)
RDW: 13.8 % (ref 11.5–15.5)
WBC: 8.4 10*3/uL (ref 4.0–10.5)

## 2017-06-17 LAB — LIPASE: Lipase: 36 U/L (ref 11.0–59.0)

## 2017-06-17 LAB — TROPONIN I: TNIDX: 0 ug/L (ref 0.00–0.06)

## 2017-06-17 MED ORDER — CITALOPRAM HYDROBROMIDE 40 MG PO TABS: 40.0000 mg | ORAL_TABLET | Freq: Every day | ORAL | 0 refills | Status: DC

## 2017-06-17 MED ORDER — SUMATRIPTAN SUCCINATE 100 MG PO TABS: 100.0000 mg | ORAL_TABLET | Freq: Once | ORAL | 0 refills | Status: DC

## 2017-06-17 MED ORDER — VALACYCLOVIR HCL 1 G PO TABS: 1000.0000 mg | ORAL_TABLET | Freq: Three times a day (TID) | ORAL | 0 refills | Status: DC

## 2017-06-17 NOTE — Progress Notes (Signed)
   Subjective:    Patient ID: Brittany [name], female    DOB: [birth year], 72 y.o.   MRN: [id number]  HPI The patient is a 72 YO female coming in for pain under her breast in a band pattern. Started in the night and was still present this morning. She took a ranitidine thinking it may be gas but this did not help. She then had the pain to move down to above her belly button after she started getting up and moving. She has not eaten yet today. Had some water which did not change the pain. Denies nausea or vomiting. Denies constipation or diarrhea. Denies sweating although she feels unwell today. Denies pain radiating to her hand or neck. Does not radiate to her back. Stable since onset. Rates 2-3/10. She denies rash on skin.   Review of Systems  Constitutional: Negative.   HENT: Negative.   Eyes: Negative.   Respiratory: Negative for cough, chest tightness and shortness of breath.   Cardiovascular: Positive for chest pain. Negative for palpitations and leg swelling.  Gastrointestinal: Positive for abdominal pain. Negative for abdominal distention, constipation, diarrhea, nausea and vomiting.  Musculoskeletal: Negative.   Skin: Negative.   Neurological: Negative.   Psychiatric/Behavioral: Negative.       Objective:   Physical Exam  Constitutional: She is oriented to person, place, and time. She appears well-developed and well-nourished.  HENT:  Head: Normocephalic and atraumatic.  Eyes: EOM are normal.  Neck: Normal range of motion.  Cardiovascular: Normal rate and regular rhythm.  Pulmonary/Chest: Effort normal and breath sounds normal. No respiratory distress. She has no wheezes. She has no rales.  Abdominal: Soft. Bowel sounds are normal. She exhibits no distension. There is tenderness. There is no rebound.  Some pain epigastric, does not increase significantly with palpation, tenderness along the rib cage bilaterally  Musculoskeletal: She exhibits no edema.  Neurological: She is alert  and oriented to person, place, and time. Coordination normal.  Skin: Skin is warm and dry.  Psychiatric: She has a normal mood and affect.   Vitals:   06/17/17 0906  BP: 128/80  Pulse: 63  Temp: 98.1 F (36.7 C)  TempSrc: Oral  SpO2: 99%  Weight: 125 lb (56.7 kg)  Height: 5\' 7"  (1.702 m)   EKG: Rate 59, axis normal, intervals normal, no st or t wave changes, 1 pvc, no significant change when compared to 2018.     Assessment & Plan:  Note she is asking for refills for celexa, sumatriptan, trazodone, ambien which have been cut off until visit with her PCP. Since these were not addressed during visit only celexa and sumatriptan sent in. Others sent to PCP for review.

## 2017-06-17 NOTE — Telephone Encounter (Signed)
Relation to pt: self Call back [place]: [facility] [phone] (Phone) [phone] (Fax)    Reason for call:  Patient was seen today and stated all medications received by pharmacy but zolpidem (AMBIEN) 10 MG tablet was not, please advise if Rx can be sent in today, please advise

## 2017-06-17 NOTE — Telephone Encounter (Signed)
Patient requesting these refills at visit. PEC pushed back patients CPE since she saw Dr. Sharlet Salina today.

## 2017-06-17 NOTE — Assessment & Plan Note (Signed)
Sounds to be muscular most likely. EKG without changes in the office. Checking troponin as well. Checking CBC, CMP, lipase to rule out abdominal cause. Given the band like location could be early shingles. Given that office closed next several days will send in valtrex to start given low likelihood of side effects. Can use tylenol for pain prn.

## 2017-06-17 NOTE — Patient Instructions (Addendum)
There is no change from the last EKG. We will check some labs today to look for a cause.   It is okay to take tylenol for the pain if needed.   If there is no cause for the pain we will send in the medicine for shingles to start taking in cause this is the cause. If needed this will be 1 pill 3 times a day for 1 week.

## 2017-06-22 ENCOUNTER — Other Ambulatory Visit: Payer: Self-pay | Admitting: Internal Medicine

## 2017-06-24 ENCOUNTER — Encounter: Payer: Self-pay | Admitting: Internal Medicine

## 2017-06-27 ENCOUNTER — Other Ambulatory Visit: Payer: Self-pay | Admitting: Internal Medicine

## 2017-06-29 ENCOUNTER — Ambulatory Visit: Payer: PPO | Admitting: Internal Medicine

## 2017-08-16 ENCOUNTER — Encounter: Payer: PPO | Admitting: Internal Medicine

## 2017-08-16 ENCOUNTER — Other Ambulatory Visit: Payer: Self-pay

## 2017-08-16 DIAGNOSIS — C50811 Malignant neoplasm of overlapping sites of right female breast: Secondary | ICD-10-CM

## 2017-08-16 DIAGNOSIS — Z170 Estrogen receptor positive status [ER+]: Principal | ICD-10-CM

## 2017-08-16 NOTE — Progress Notes (Signed)
ID: Brittany [name]   DOB: [birth year]  MR#: [id number]  WUJ#:[id number]   PCP:  Tresa Garter, MD GYN: SURGEON:  Cyndia Bent, MD RADONC:  Lurline Hare, MD OTHER:  Jetty Duhamel, MD  CHIEF COMPLAINT:  Triple negative Breast Cancer; osteoporosis  CURRENT TREATMENT: Observation; denosumab  HISTORY OF PRESENT ILLNESS: From original  intake note:  The patient noted a mass in her right breast and immediately brought it to Dr. Cherlyn Labella attention 11/22/2007.  I should note that the patient had a negative routine screening mammogram at [facility] on 07/19/2007.  In the 11/22/2007 study, there was at least a 1.5-cm ill-defined density seen primarily on the right MLO view.  There were a few associated microcalcifications.  By palpation, Dr. Yolanda Bonine felt this was approximately 1.9 cm.  By ultrasound, this was an irregular hypoechoic mass measuring up to 1.9 cm.  There were a few irregular vessels present by Doppler.  There were no abnormal lymph nodes noted.  Breast specific gamma imaging was performed the next day, 11/23/2007, and showed a normal left breast.  On the right, there was a 1.6-cm high-density focus of abnormal isotope activity.  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  HER2-neu was negative at 1+. Her subsequent history is as detailed below.  INTERVAL HISTORY: Brittany [name] returns today for follow-up and treatment of her triple negative breast cancer. She continues under observation. The interval history is generally unremarkable. 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 She denies any changes in her breasts.   She receives denosumab every 6 months. She tolerates this well. She denies having bony aches after the shot.  There have been no dental problems  She  completed a bone density at [facility] on  05/25/2017 showed a T score of  -2.2 .  This is unchanged   REVIEW OF SYSTEMS: Hayle reports that she enjoys gardening. 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  She also goes to knitting at [employer]. [place], and she walks outside. She goes to the gym about 3 times per week from September to April.  She denies unusual headaches, visual changes, nausea, vomiting, or dizziness. There has been no unusual cough, phlegm production, or pleurisy. This been no change in bowel or bladder habits. She denies unexplained fatigue or unexplained weight loss, bleeding, rash, or fever. A detailed review of systems was otherwise stable.    PAST MEDICAL HISTORY: Past Medical History:  Diagnosis Date  . Acid reflux   . Anemia   . Arthritis   . Breast cancer (HCC) 11/23/2007   right, ER/PR -, HER 2 -  . Cancer [facility])    breast right  triple recet neg, lumpect, chemo, XRT  . Esophageal stricture   . Gastritis 10/2007  . History of chemotherapy    neoadjuvant chemo, last dose 03/2008  . History of radiation therapy 07/25/08 -09/11/08   right breast  . Hx of colonic polyps 08/11/2016  . Hyperlipidemia   . Migraines    rare  . Osteopenia   Significant for osteopenia, history of hiatal hernia with esophageal stricture, status post upper endoscopy under [facility] 10/10/2007 showing an erosive gastritis in a background of reactive gastropathy ([id number]).  She has a history of rare migraines, and  when she was 17, she had a benign growth removed from the back of her right calf.    PAST SURGICAL HISTORY: Past Surgical History:  Procedure Laterality Date  . BREAST LUMPECTOMY  06/26/2008   right, CA   XRT, chemo  . COLONOSCOPY    . EYE SURGERY     left eye cataract surgery 12/23/2015  . growth removed per right thigh    . JOINT REPLACEMENT  12/31/2015   RTK  . QUADRICEPS TENDON REPAIR Right 09/14/2016   Procedure: OPEN REPAIR  QUADRICEP TENDON;  Surgeon: Durene Romans, MD;  Location: [facility];  Service: Orthopedics;  Laterality: Right;  . TOTAL KNEE ARTHROPLASTY Right 12/31/2015   Procedure: RIGHT TOTAL KNEE ARTHROPLASTY;  Surgeon: Durene Romans, MD;  Location: [facility];  Service: Orthopedics;  Laterality: Right;  . UPPER GASTROINTESTINAL ENDOSCOPY      FAMILY HISTORY Family History  Problem Relation Age of Onset  . Heart disease Father   . Cancer Father        lung  . Cancer Sister 47       Breast & Colorectal  . Epilepsy Mother   . Dementia Mother   The patient's father died from lung cancer at the age of 9; he was not a smoker.  The patient's mother is alive at age 70; she has epilepsy.  The patient has a sister diagnosed with breast cancer when she was 50.  She is doing well.  The patient has a brother in good health.  There is no other history of breast or ovarian cancer in the family.  GYNECOLOGIC HISTORY: She is GX P3, first pregnancy to term age 43, last menstrual period when she was 72 years old.  She never took any hormones.     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  Their son, Brittany [name], works for [employer], an Electronics engineer, and is working for his [employer]; he has 2 children and lives in [place], right outside of [place].  They have a daughter, Brittany [name], who lives in [place] and has 3 children, and a son, Brittany [name], who just married a young woman from [place] and is now living in [place].  The patient attends the [employer].    ADVANCED DIRECTIVES:  HEALTH MAINTENANCE:  (Updated January 2015) Social History   Tobacco Use  . Smoking status: Former Smoker    Packs/day: 0.25    Years: 5.00    Pack years: 1.25    Types: Cigarettes    Last attempt to quit: 03/03/1967    Years since quitting: 50.4  . Smokeless  tobacco: Never Used  . Tobacco comment: 6 cig per day when she was smoking/quit years ago  Substance Use Topics  . Alcohol use: Yes    Alcohol/week: 4.2 oz    Types: 7 Cans of beer per week    Comment: 1 beer per day   . Drug use: No     Colonoscopy:  April 2007/Dr. Gessner  PAP: Not on file  Bone density: 05/25/2017 showed a T score of  -2.2   Lipid panel: January 2015/Dr. Plotnikov   Allergies  Allergen Reactions  . Boniva [Ibandronic Acid] Nausea Only  . Lipitor [Atorvastatin] Other (See Comments)    Arthralgias (joint pain)    Current Outpatient Medications  Medication Sig Dispense Refill  .  acetaminophen (TYLENOL) 325 MG tablet Take 2 tablets (650 mg total) by mouth every 6 (six) hours as needed for mild pain (or Fever >/= 101).    . Ascorbic Acid (VITAMIN C PO) Take 1 tablet by mouth daily.    . Biotin 1000 MCG tablet Take 1,000 mcg by mouth daily.    . Calcium Carb-Cholecalciferol (CALCIUM 600 + D PO) Take 1 tablet by mouth 2 (two) times daily.    . Cholecalciferol (VITAMIN D3) 1000 units CAPS Take 1,000 Units by mouth daily.    . citalopram (CELEXA) 40 MG tablet Take 1 tablet (40 mg total) by mouth daily. 90 tablet 0  . denosumab (PROLIA) 60 MG/ML SOLN injection Inject 60 mg into the skin once. Administer in upper arm, thigh, or abdomen (Patient taking differently: Inject 60 mg into the skin every 6 (six) months. Administer in upper arm, thigh, or abdomen) 1.8 mL 1  . diphenhydrAMINE (BENADRYL) 25 MG tablet Take 25 mg by mouth 2 (two) times daily as needed for allergies.     [name] docusate sodium (COLACE) 100 MG capsule Take 1 capsule (100 mg total) by mouth 2 (two) times daily. 10 capsule 0  . ferrous sulfate 325 (65 FE) MG tablet Take 1 tablet (325 mg total) by mouth 2 (two) times daily with a meal. 60 tablet 3  . Omega-3 Fatty Acids (FISH OIL) 1200 MG CAPS Take 1,200 mg by mouth daily.     . polyethylene glycol (MIRALAX / GLYCOLAX) packet Take 17 g by mouth 2 (two) times  daily. 14 each 0  . ranitidine (ZANTAC) 150 MG tablet Take 1 tablet (150 mg total) by mouth 2 (two) times daily. Must keep appt on 06/29/17 60 tablet 0  . SUMAtriptan (IMITREX) 100 MG tablet Take 1 tablet (100 mg total) by mouth once for 1 dose. May repeat in 2 hours if headache persists or recurs. 9 tablet 0  . traMADol (ULTRAM) 50 MG tablet Take 1-2 tablets (50-100 mg total) by mouth every 4 (four) hours as needed for moderate pain. 40 tablet 0  . traZODone (DESYREL) 50 MG tablet Take 0.5-1 tablets (25-50 mg total) by mouth at bedtime as needed. for sleep Follow-up appt is due must see Md for refills (Patient taking differently: Take 50 mg by mouth at bedtime as needed for sleep. for sleep Follow-up appt is due must see Md for refills) 30 tablet 5  . valACYclovir (VALTREX) 1000 MG tablet Take 1 tablet (1,000 mg total) by mouth 3 (three) times daily. 21 tablet 0  . vitamin B-12 (CYANOCOBALAMIN) 1000 MCG tablet Take 1,000 mcg by mouth daily.    [name] zolpidem (AMBIEN) 10 MG tablet Take 1 tablet (10 mg total) by mouth at bedtime as needed. for sleep 30 tablet 0   No current facility-administered medications for this visit.     OBJECTIVE: Middle-aged white woman who appears well Vitals:   08/17/17 1450  BP: 111/76  Pulse: 78  Resp: 18  Temp: 98.1 F (36.7 C)  SpO2: (!) 78%     Body mass index is 19.5 kg/m.    ECOG FS: 0 Filed Weights   08/17/17 1450  Weight: 124 lb 8 oz (56.5 kg)   Sclerae unicteric, pupils round and equal Oropharynx clear and moist No cervical or supraclavicular adenopathy Lungs no rales or rhonchi Heart regular rate with frequent skipped beats, no murmur appreciated Abd soft, nontender, positive bowel sounds MSK no focal spinal tenderness, no upper extremity lymphedema Neuro: nonfocal, well  oriented, appropriate affect Breasts: The right breast is status post lumpectomy and radiation.  There is no evidence of local recurrence.  Left breast is unremarkable.  Both  axillae are benign.  LAB RESULTS: Lab Results  Component Value Date   WBC 5.8 08/17/2017   NEUTROABS 3.2 08/17/2017   HGB 12.0 08/17/2017   HCT 35.5 08/17/2017   MCV 95.1 08/17/2017   PLT 317 08/17/2017      Chemistry      Component Value Date/Time   NA 136 06/17/2017 0946   NA 136 02/16/2017 1315   K 3.7 06/17/2017 0946   K 4.1 02/16/2017 1315   CL 100 06/17/2017 0946   CL 98 03/21/2012 1053   CO2 29 06/17/2017 0946   CO2 26 02/16/2017 1315   BUN 18 06/17/2017 0946   BUN 10.4 02/16/2017 1315   CREATININE 0.72 06/17/2017 0946   CREATININE 0.7 02/16/2017 1315      Component Value Date/Time   CALCIUM 9.5 06/17/2017 0946   CALCIUM 9.2 02/16/2017 1315   ALKPHOS 44 06/17/2017 0946   ALKPHOS 56 02/16/2017 1315   AST 15 06/17/2017 0946   AST 17 02/16/2017 1315   ALT 7 06/17/2017 0946   ALT 7 02/16/2017 1315   BILITOT 0.4 06/17/2017 0946   BILITOT 0.34 02/16/2017 1315        STUDIES: She completed a bone density at [facility] on 05/25/2017 which showed a T score of -2.2 --unchanged  ASSESSMENT: 72 y.o. [place] woman   (1) s/p right breast bopsy 11/2007 for a clinical  T1c N0, stage IA Invasive ductal carcinoma, grade 2-3, triple-negative with an MIB-1 of 50%.  (2) neoadjuvant chemotherapy consisted of docetaxel/ gemcitabine/ bevacizumab followed by doxorubicin/ cyclophosphamide/ bevacizumab as per the NSABP B-40 protocol.  Last bevacizumab dose was January of 2010.   (3) status-post right lumpectomy and sentinel lymph node dissection in April of 2010 showing a complete pathologic response   (4) She completed adjuvant radiation July 2010.   (5)  Osteoporosis: on vit D/ calcium; received zolendronate  OCT 2013 and SEPT 2014, started prolia/ denosumab 11/30/2013, to be repeated every 6 months  (a) dexa scan 05/15/2013 showed a T score of -2.3 (improved)  (b) DEXA scan 05/13/2015 showed a T score of -2.2 (stable)  (c) DEXA scan 05/25/2017 showed a T score of  -2.2    PLAN:  Laquila is now 9 years out from definitive surgery for breast cancer with no evidence of disease recurrence.  This is very favorable.  She receives denosumab/Prolia every 6 months, with a dose due today.  She has had no problems from this so far.  Her bone density is stable.  She does see her dentist on a regular basis so far with no issues  Her heart rhythm today showed some irregularities so we went ahead and got an electrocardiogram.  This showed only PACs.  I have copied her primary care physician on the EKG results  Ilze is interested in participating in our [place] garden project if we ever get it off the ground  She will see me again in a year.  She knows to call for any problems that may develop before the next visit.  Danetra Glock, Valentino Hue, MD  08/17/17 3:25 PM Medical Oncology and Hematology [facility] [address] [place], [place] [address] Tel. [phone]    Fax. [phone]  Fonnie Birkenhead, am acting as scribe for Lowella Dell MD.  I, Ruthann Cancer MD, have  reviewed the above documentation for accuracy and completeness, and I agree with the above.

## 2017-08-17 ENCOUNTER — Inpatient Hospital Stay: Payer: PPO

## 2017-08-17 ENCOUNTER — Other Ambulatory Visit: Payer: Self-pay

## 2017-08-17 ENCOUNTER — Inpatient Hospital Stay: Payer: PPO | Attending: Oncology | Admitting: Oncology

## 2017-08-17 VITALS — BP 111/76 | HR 78 | Temp 98.1°F | Resp 18 | Ht 67.0 in | Wt 124.5 lb

## 2017-08-17 DIAGNOSIS — Z923 Personal history of irradiation: Secondary | ICD-10-CM | POA: Diagnosis not present

## 2017-08-17 DIAGNOSIS — Z170 Estrogen receptor positive status [ER+]: Secondary | ICD-10-CM

## 2017-08-17 DIAGNOSIS — Z853 Personal history of malignant neoplasm of breast: Secondary | ICD-10-CM

## 2017-08-17 DIAGNOSIS — M810 Age-related osteoporosis without current pathological fracture: Secondary | ICD-10-CM

## 2017-08-17 DIAGNOSIS — Z801 Family history of malignant neoplasm of trachea, bronchus and lung: Secondary | ICD-10-CM | POA: Diagnosis not present

## 2017-08-17 DIAGNOSIS — Z9221 Personal history of antineoplastic chemotherapy: Secondary | ICD-10-CM | POA: Diagnosis not present

## 2017-08-17 DIAGNOSIS — Z87891 Personal history of nicotine dependence: Secondary | ICD-10-CM | POA: Insufficient documentation

## 2017-08-17 DIAGNOSIS — Z79899 Other long term (current) drug therapy: Secondary | ICD-10-CM | POA: Insufficient documentation

## 2017-08-17 DIAGNOSIS — C50811 Malignant neoplasm of overlapping sites of right female breast: Secondary | ICD-10-CM

## 2017-08-17 LAB — CBC WITH DIFFERENTIAL (CANCER CENTER ONLY)
BASOS PCT: 1 %
Basophils Absolute: 0.1 10*3/uL (ref 0.0–0.1)
EOS ABS: 0.5 10*3/uL (ref 0.0–0.5)
Eosinophils Relative: 8 %
HCT: 35.5 % (ref 34.8–46.6)
Hemoglobin: 12 g/dL (ref 11.6–15.9)
Lymphocytes Relative: 27 %
Lymphs Abs: 1.5 10*3/uL (ref 0.9–3.3)
MCH: 32 pg (ref 25.1–34.0)
MCHC: 33.7 g/dL (ref 31.5–36.0)
MCV: 95.1 fL (ref 79.5–101.0)
MONOS PCT: 9 %
Monocytes Absolute: 0.5 10*3/uL (ref 0.1–0.9)
Neutro Abs: 3.2 10*3/uL (ref 1.5–6.5)
Neutrophils Relative %: 55 %
Platelet Count: 317 10*3/uL (ref 145–400)
RBC: 3.73 MIL/uL (ref 3.70–5.45)
RDW: 13.3 % (ref 11.2–14.5)
WBC: 5.8 10*3/uL (ref 3.9–10.3)

## 2017-08-17 LAB — CMP (CANCER CENTER ONLY)
ALBUMIN: 3.8 g/dL (ref 3.5–5.0)
ALK PHOS: 60 U/L (ref 40–150)
ALT: 7 U/L (ref 0–55)
ANION GAP: 5 (ref 3–11)
AST: 15 U/L (ref 5–34)
BUN: 13 mg/dL (ref 7–26)
CALCIUM: 9.1 mg/dL (ref 8.4–10.4)
CHLORIDE: 105 mmol/L (ref 98–109)
CO2: 27 mmol/L (ref 22–29)
Creatinine: 0.73 mg/dL (ref 0.60–1.10)
GFR, Est AFR Am: >60 mL/min (ref >60)
GFR, Estimated: >60 mL/min (ref >60)
GLUCOSE: 98 mg/dL (ref 70–140)
POTASSIUM: 4 mmol/L (ref 3.5–5.1)
SODIUM: 137 mmol/L (ref 136–145)
Total Bilirubin: <0.2 mg/dL — ABNORMAL LOW (ref 0.2–1.2)
Total Protein: 7.1 g/dL (ref 6.4–8.3)

## 2017-08-17 MED ORDER — DENOSUMAB 60 MG/ML ~~LOC~~ SOSY
60.0000 mg | PREFILLED_SYRINGE | Freq: Once | SUBCUTANEOUS | Status: AC
  Administered 2017-08-17: 60 mg via SUBCUTANEOUS

## 2017-08-23 ENCOUNTER — Ambulatory Visit (INDEPENDENT_AMBULATORY_CARE_PROVIDER_SITE_OTHER)
Admission: RE | Admit: 2017-08-23 | Discharge: 2017-08-23 | Disposition: A | Discharge status: Home / Self Care | Payer: PPO | Source: Ambulatory Visit | Attending: Internal Medicine | Admitting: Internal Medicine

## 2017-08-23 ENCOUNTER — Ambulatory Visit (INDEPENDENT_AMBULATORY_CARE_PROVIDER_SITE_OTHER): Payer: PPO | Admitting: Internal Medicine

## 2017-08-23 ENCOUNTER — Encounter: Payer: Self-pay | Admitting: Internal Medicine

## 2017-08-23 VITALS — BP 112/72 | HR 73 | Temp 99.1°F | Ht 67.0 in | Wt 126.0 lb

## 2017-08-23 DIAGNOSIS — F411 Generalized anxiety disorder: Secondary | ICD-10-CM

## 2017-08-23 DIAGNOSIS — D508 Other iron deficiency anemias: Secondary | ICD-10-CM | POA: Diagnosis not present

## 2017-08-23 DIAGNOSIS — Z52008 Unspecified donor, other blood: Secondary | ICD-10-CM

## 2017-08-23 DIAGNOSIS — M810 Age-related osteoporosis without current pathological fracture: Secondary | ICD-10-CM | POA: Diagnosis not present

## 2017-08-23 DIAGNOSIS — R05 Cough: Secondary | ICD-10-CM

## 2017-08-23 DIAGNOSIS — R059 Cough, unspecified: Secondary | ICD-10-CM

## 2017-08-23 DIAGNOSIS — F3342 Major depressive disorder, recurrent, in full remission: Secondary | ICD-10-CM | POA: Diagnosis not present

## 2017-08-23 DIAGNOSIS — R9431 Abnormal electrocardiogram [ECG] [EKG]: Secondary | ICD-10-CM | POA: Insufficient documentation

## 2017-08-23 MED ORDER — CITALOPRAM HYDROBROMIDE 40 MG PO TABS: 40.0000 mg | ORAL_TABLET | Freq: Every day | ORAL | 3 refills | Status: DC

## 2017-08-23 MED ORDER — TRAZODONE HCL 50 MG PO TABS: 50.0000 mg | ORAL_TABLET | Freq: Every evening | ORAL | 3 refills | Status: DC | PRN

## 2017-08-23 MED ORDER — ZOLPIDEM TARTRATE 10 MG PO TABS: 5.0000 mg | ORAL_TABLET | Freq: Every evening | ORAL | 0 refills | Status: DC | PRN

## 2017-08-23 MED ORDER — RANITIDINE HCL 150 MG PO TABS: 150.0000 mg | ORAL_TABLET | Freq: Two times a day (BID) | ORAL | 3 refills | Status: DC

## 2017-08-23 MED ORDER — SUMATRIPTAN SUCCINATE 100 MG PO TABS: 100.0000 mg | ORAL_TABLET | Freq: Once | ORAL | 5 refills | Status: DC

## 2017-08-23 NOTE — Assessment & Plan Note (Signed)
On iron 

## 2017-08-23 NOTE — Patient Instructions (Addendum)
Valerian root for sleep  MC well w/Jill

## 2017-08-23 NOTE — Assessment & Plan Note (Signed)
Labs

## 2017-08-23 NOTE — Assessment & Plan Note (Signed)
D/c Tazodone Valerian root prn

## 2017-08-23 NOTE — Assessment & Plan Note (Signed)
Prolia since 2015

## 2017-08-23 NOTE — Progress Notes (Signed)
Subjective:  Patient ID: Brittany [name], female    DOB: [birth year]  Age: 72 y.o. MRN: [id number]  CC: No chief complaint on file.   HPI [name] presents for a well exam C/o cough Pt had abn EKG  Outpatient Medications Prior to Visit  Medication Sig Dispense Refill  . acetaminophen (TYLENOL) 325 MG tablet Take 2 tablets (650 mg total) by mouth every 6 (six) hours as needed for mild pain (or Fever >/= 101).    . Ascorbic Acid (VITAMIN C PO) Take 1 tablet by mouth daily.    . Biotin 1000 MCG tablet Take 1,000 mcg by mouth daily.    . Calcium Carb-Cholecalciferol (CALCIUM 600 + D PO) Take 1 tablet by mouth 2 (two) times daily.    . Cholecalciferol (VITAMIN D3) 1000 units CAPS Take 1,000 Units by mouth daily.    . citalopram (CELEXA) 40 MG tablet Take 1 tablet (40 mg total) by mouth daily. 90 tablet 0  . denosumab (PROLIA) 60 MG/ML SOLN injection Inject 60 mg into the skin once. Administer in upper arm, thigh, or abdomen (Patient taking differently: Inject 60 mg into the skin every 6 (six) months. Administer in upper arm, thigh, or abdomen) 1.8 mL 1  . diphenhydrAMINE (BENADRYL) 25 MG tablet Take 25 mg by mouth 2 (two) times daily as needed for allergies.     [name] docusate sodium (COLACE) 100 MG capsule Take 1 capsule (100 mg total) by mouth 2 (two) times daily. 10 capsule 0  . ferrous sulfate 325 (65 FE) MG tablet Take 1 tablet (325 mg total) by mouth 2 (two) times daily with a meal. 60 tablet 3  . Omega-3 Fatty Acids (FISH OIL) 1200 MG CAPS Take 1,200 mg by mouth daily.     . polyethylene glycol (MIRALAX / GLYCOLAX) packet Take 17 g by mouth 2 (two) times daily. 14 each 0  . ranitidine (ZANTAC) 150 MG tablet Take 1 tablet (150 mg total) by mouth 2 (two) times daily. Must keep appt on 06/29/17 60 tablet 0  . traZODone (DESYREL) 50 MG tablet Take 0.5-1 tablets (25-50 mg total) by mouth at bedtime as needed. for sleep Follow-up appt is due must see Md for refills (Patient taking differently:  Take 50 mg by mouth at bedtime as needed for sleep. for sleep Follow-up appt is due must see Md for refills) 30 tablet 5  . vitamin B-12 (CYANOCOBALAMIN) 1000 MCG tablet Take 1,000 mcg by mouth daily.    [name] zolpidem (AMBIEN) 10 MG tablet Take 1 tablet (10 mg total) by mouth at bedtime as needed. for sleep 30 tablet 0  . SUMAtriptan (IMITREX) 100 MG tablet Take 1 tablet (100 mg total) by mouth once for 1 dose. May repeat in 2 hours if headache persists or recurs. 9 tablet 0   No facility-administered medications prior to visit.     ROS: Review of Systems  Constitutional: Negative for activity change, appetite change, chills, fatigue and unexpected weight change.  HENT: Negative for congestion, mouth sores and sinus pressure.   Eyes: Negative for visual disturbance.  Respiratory: Negative for cough and chest tightness.   Gastrointestinal: Negative for abdominal pain and nausea.  Genitourinary: Negative for difficulty urinating, frequency and vaginal pain.  Musculoskeletal: Negative for back pain and gait problem.  Skin: Negative for pallor and rash.  Neurological: Negative for dizziness, tremors, weakness, numbness and headaches.  Psychiatric/Behavioral: Positive for sleep disturbance. Negative for confusion and suicidal ideas. The patient is nervous/anxious.  Objective:  BP 112/72 (BP Location: Left Arm, Patient Position: Sitting, Cuff Size: Normal)   Pulse 73   Temp 99.1 F (37.3 C) (Oral)   Ht 5\' 7"  (1.702 m)   Wt 126 lb (57.2 kg)   LMP 03/02/1994 (Approximate)   SpO2 99%   BMI 19.73 kg/m   BP Readings from Last 3 Encounters:  08/23/17 112/72  08/17/17 111/76  06/17/17 128/80    Wt Readings from Last 3 Encounters:  08/23/17 126 lb (57.2 kg)  08/17/17 124 lb 8 oz (56.5 kg)  06/17/17 125 lb (56.7 kg)    Physical Exam  Constitutional: She appears well-developed. No distress.  HENT:  Head: Normocephalic.  Right Ear: External ear normal.  Left Ear: External ear  normal.  Nose: Nose normal.  Mouth/Throat: Oropharynx is clear and moist.  Eyes: Pupils are equal, round, and reactive to light. Conjunctivae are normal. Right eye exhibits no discharge. Left eye exhibits no discharge.  Neck: Normal range of motion. Neck supple. No JVD present. No tracheal deviation present. No thyromegaly present.  Cardiovascular: Normal rate, regular rhythm and normal heart sounds.  Pulmonary/Chest: No stridor. No respiratory distress. She has no wheezes.  Abdominal: Soft. Bowel sounds are normal. She exhibits no distension and no mass. There is no tenderness. There is no rebound and no guarding.  Musculoskeletal: She exhibits no edema or tenderness.  Lymphadenopathy:    She has no cervical adenopathy.  Neurological: She displays normal reflexes. No cranial nerve deficit. She exhibits normal muscle tone. Coordination normal.  Skin: No rash noted. No erythema.  Psychiatric: She has a normal mood and affect. Her behavior is normal. Judgment and thought content normal.    Lab Results  Component Value Date   WBC 5.8 08/17/2017   HGB 12.0 08/17/2017   HCT 35.5 08/17/2017   PLT 317 08/17/2017   GLUCOSE 98 08/17/2017   CHOL 163 07/31/2015   TRIG 70.0 07/31/2015   HDL 63.30 07/31/2015   LDLDIRECT 178.9 03/23/2013   LDLCALC 85 07/31/2015   ALT 7 08/17/2017   AST 15 08/17/2017   NA 137 08/17/2017   K 4.0 08/17/2017   CL 105 08/17/2017   CREATININE 0.73 08/17/2017   BUN 13 08/17/2017   CO2 27 08/17/2017   TSH 1.41 03/23/2013    Nm Bone Scan 3 Phase  Result Date: 04/09/2017 CLINICAL DATA:  RIGHT knee TKA, EXAM: NUCLEAR MEDICINE 3-PHASE BONE SCAN TECHNIQUE: Radionuclide angiographic images, immediate static blood pool images, and 3-hour delayed static images were obtained of the knees after intravenous injection of radiopharmaceutical. RADIOPHARMACEUTICALS:  21.3 mCi Tc-64m MDP IV COMPARISON:  Whole body bone scan 12/16/2007 FINDINGS: Vascular phase: Mildly increased  blood flow to RIGHT knee. Blood pool phase: Mildly increased blood pool diffusely around RIGHT knee. Photopenic defect at RIGHT knee from knee prosthesis. Normal blood pool LEFT knee. Delayed phase: Minimal tracer localization surrounding the RIGHT knee prosthesis diffusely. No focal areas of increased delayed tracer accumulation are seen at the RIGHT knee prosthesis to suggest loosening or infection. Minimal uptake at the LEFT knee likely degenerative in origin. IMPRESSION: Mildly increased blood flow and blood pool of tracer at RIGHT knee suggesting synovitis. No scintigraphic evidence of prosthetic loosening or infection RIGHT knee. Probable minimal degenerative type uptake at LEFT knee. Electronically Signed   By: Lavonia Dana M.D.   On: 04/09/2017 16:53    Assessment & Plan:   There are no diagnoses linked to this encounter.   No orders of the defined  types were placed in this encounter.    Follow-up: No follow-ups on file.  Walker Kehr, MD

## 2017-09-20 ENCOUNTER — Telehealth: Payer: Self-pay | Admitting: Internal Medicine

## 2017-09-20 NOTE — Telephone Encounter (Signed)
Copied from [place] [id number]. Topic: Quick Communication - See Telephone Encounter >> Sep 20, 2017  9:37 AM Antonieta Iba C wrote: CRM for notification. See Telephone encounter for: 09/20/17.  Pt says that she was just seen by provider for CPE. Pt says that she is having UTI symptoms. Frequent urination, and discomfort with urinating. Pt would like to come in to provide a urine for medication if possible due to no openings w/PCP.   CB: [phone]    Please advise. LOV was 6/24.

## 2017-09-21 NOTE — Telephone Encounter (Signed)
Please advise or does pt need to be seen?

## 2017-09-22 NOTE — Telephone Encounter (Signed)
I can see her today Thx

## 2017-09-22 NOTE — Telephone Encounter (Signed)
Called both of patients numbers on file and left her a VM. Dr. Camila Li is asking her to call the office back and set up an appointment today with him to be seen for this issue.

## 2017-10-12 ENCOUNTER — Telehealth: Payer: Self-pay | Admitting: Internal Medicine

## 2017-10-12 NOTE — Telephone Encounter (Signed)
Copied from [place] [id number]. Topic: Referral - Request >> Oct 12, 2017  4:25 PM Bea Graff, NT wrote: Reason for CRM: Pt requesting a referral to a cardiologist, Dr. Marlou Porch with [facility] for her irregular heartbeat.

## 2017-10-13 ENCOUNTER — Other Ambulatory Visit: Payer: Self-pay | Admitting: Internal Medicine

## 2017-10-13 ENCOUNTER — Encounter: Payer: Self-pay | Admitting: Internal Medicine

## 2017-10-13 ENCOUNTER — Ambulatory Visit (INDEPENDENT_AMBULATORY_CARE_PROVIDER_SITE_OTHER): Payer: PPO | Admitting: Internal Medicine

## 2017-10-13 ENCOUNTER — Other Ambulatory Visit (INDEPENDENT_AMBULATORY_CARE_PROVIDER_SITE_OTHER): Payer: PPO

## 2017-10-13 ENCOUNTER — Ambulatory Visit: Payer: PPO | Admitting: Internal Medicine

## 2017-10-13 VITALS — BP 114/72 | HR 67 | Temp 98.1°F | Ht 67.0 in | Wt 124.0 lb

## 2017-10-13 DIAGNOSIS — R079 Chest pain, unspecified: Secondary | ICD-10-CM | POA: Diagnosis not present

## 2017-10-13 DIAGNOSIS — R350 Frequency of micturition: Secondary | ICD-10-CM

## 2017-10-13 DIAGNOSIS — I499 Cardiac arrhythmia, unspecified: Secondary | ICD-10-CM | POA: Insufficient documentation

## 2017-10-13 DIAGNOSIS — R300 Dysuria: Secondary | ICD-10-CM | POA: Diagnosis not present

## 2017-10-13 HISTORY — DX: Cardiac arrhythmia, unspecified: I49.9

## 2017-10-13 LAB — URINALYSIS, ROUTINE W REFLEX MICROSCOPIC
Bilirubin Urine: NEGATIVE
HGB URINE DIPSTICK: NEGATIVE
Ketones, ur: NEGATIVE
Nitrite: NEGATIVE
RBC / HPF: NONE SEEN (ref 0–?)
Specific Gravity, Urine: <=1.005 — AB (ref 1.000–1.030)
Total Protein, Urine: NEGATIVE
Urine Glucose: NEGATIVE
Urobilinogen, UA: 0.2 (ref 0.0–1.0)
pH: 7 (ref 5.0–8.0)

## 2017-10-13 LAB — MAGNESIUM: MAGNESIUM: 1.9 mg/dL (ref 1.5–2.5)

## 2017-10-13 LAB — BASIC METABOLIC PANEL
BUN: 12 mg/dL (ref 6–23)
CALCIUM: 9.6 mg/dL (ref 8.4–10.5)
CO2: 28 mEq/L (ref 19–32)
Chloride: 102 mEq/L (ref 96–112)
Creatinine, Ser: 0.69 mg/dL (ref 0.40–1.20)
GFR: 89.37 mL/min (ref >60.00)
Glucose, Bld: 91 mg/dL (ref 70–99)
Potassium: 4 mEq/L (ref 3.5–5.1)
SODIUM: 137 meq/L (ref 135–145)

## 2017-10-13 LAB — TSH: TSH: 1.37 u[IU]/mL (ref 0.35–4.50)

## 2017-10-13 MED ORDER — CEFUROXIME AXETIL 250 MG PO TABS: 250.0000 mg | ORAL_TABLET | Freq: Two times a day (BID) | ORAL | 0 refills | Status: DC

## 2017-10-13 NOTE — Assessment & Plan Note (Signed)
UA

## 2017-10-13 NOTE — Progress Notes (Addendum)
Subjective:  Patient ID: Brittany [name], female    DOB: 12-17-1945  Age: 72 y.o. MRN: [id number]  CC: No chief complaint on file.   HPI Brittany [name] presents for irregular beats off and on. No other sx's. No CP, SOB C/o urinary sx's - mild  Outpatient Medications Prior to Visit  Medication Sig Dispense Refill  . acetaminophen (TYLENOL) 325 MG tablet Take 2 tablets (650 mg total) by mouth every 6 (six) hours as needed for mild pain (or Fever >/= 101).    . Ascorbic Acid (VITAMIN C PO) Take 1 tablet by mouth daily.    . Biotin 1000 MCG tablet Take 1,000 mcg by mouth daily.    . Calcium Carb-Cholecalciferol (CALCIUM 600 + D PO) Take 1 tablet by mouth 2 (two) times daily.    . Cholecalciferol (VITAMIN D3) 1000 units CAPS Take 1,000 Units by mouth daily.    . citalopram (CELEXA) 40 MG tablet Take 1 tablet (40 mg total) by mouth daily. 90 tablet 3  . denosumab (PROLIA) 60 MG/ML SOLN injection Inject 60 mg into the skin once. Administer in upper arm, thigh, or abdomen (Patient taking differently: Inject 60 mg into the skin every 6 (six) months. Administer in upper arm, thigh, or abdomen) 1.8 mL 1  . diphenhydrAMINE (BENADRYL) 25 MG tablet Take 25 mg by mouth 2 (two) times daily as needed for allergies.     [name] docusate sodium (COLACE) 100 MG capsule Take 1 capsule (100 mg total) by mouth 2 (two) times daily. 10 capsule 0  . ferrous sulfate 325 (65 FE) MG tablet Take 1 tablet (325 mg total) by mouth 2 (two) times daily with a meal. 60 tablet 3  . Omega-3 Fatty Acids (FISH OIL) 1200 MG CAPS Take 1,200 mg by mouth daily.     . polyethylene glycol (MIRALAX / GLYCOLAX) packet Take 17 g by mouth 2 (two) times daily. 14 each 0  . ranitidine (ZANTAC) 150 MG tablet Take 1 tablet (150 mg total) by mouth 2 (two) times daily. Must keep appt on 06/29/17 180 tablet 3  . vitamin B-12 (CYANOCOBALAMIN) 1000 MCG tablet Take 1,000 mcg by mouth daily.    [name] zolpidem (AMBIEN) 10 MG tablet Take 0.5-1 tablets (5-10 mg  total) by mouth at bedtime as needed. for sleep 90 tablet 0  . SUMAtriptan (IMITREX) 100 MG tablet Take 1 tablet (100 mg total) by mouth once for 1 dose. May repeat in 2 hours if headache persists or recurs. 9 tablet 5   No facility-administered medications prior to visit.     ROS: Review of Systems  Constitutional: Negative for activity change, appetite change, chills, fatigue and unexpected weight change.  HENT: Negative for congestion, mouth sores and sinus pressure.   Eyes: Negative for visual disturbance.  Respiratory: Negative for cough, chest tightness and shortness of breath.   Cardiovascular: Negative for chest pain, palpitations and leg swelling.  Gastrointestinal: Negative for abdominal pain and nausea.  Genitourinary: Positive for urgency. Negative for difficulty urinating, frequency and vaginal pain.  Musculoskeletal: Negative for back pain and gait problem.  Skin: Negative for pallor and rash.  Neurological: Negative for dizziness, tremors, weakness, numbness and headaches.  Psychiatric/Behavioral: Negative for confusion and sleep disturbance.    Objective:  Ht 5\' 7"  (1.702 m)   Wt 124 lb (56.2 kg)   LMP 03/02/1994 (Approximate)   BMI 19.42 kg/m   BP Readings from Last 3 Encounters:  08/23/17 112/72  08/17/17 111/76  06/17/17  128/80    Wt Readings from Last 3 Encounters:  10/13/17 124 lb (56.2 kg)  08/23/17 126 lb (57.2 kg)  08/17/17 124 lb 8 oz (56.5 kg)    Physical Exam  Constitutional: She appears well-developed. No distress.  HENT:  Head: Normocephalic.  Right Ear: External ear normal.  Left Ear: External ear normal.  Nose: Nose normal.  Mouth/Throat: Oropharynx is clear and moist.  Eyes: Pupils are equal, round, and reactive to light. Conjunctivae are normal. Right eye exhibits no discharge. Left eye exhibits no discharge.  Neck: Normal range of motion. Neck supple. No JVD present. No tracheal deviation present. No thyromegaly present.    Cardiovascular: Normal rate, regular rhythm and normal heart sounds.  Pulmonary/Chest: No stridor. No respiratory distress. She has no wheezes.  Abdominal: Soft. Bowel sounds are normal. She exhibits no distension and no mass. There is no tenderness. There is no rebound and no guarding.  Musculoskeletal: She exhibits no edema or tenderness.  Lymphadenopathy:    She has no cervical adenopathy.  Neurological: She displays normal reflexes. No cranial nerve deficit. She exhibits normal muscle tone. Coordination normal.  Skin: No rash noted. No erythema.  Psychiatric: She has a normal mood and affect. Her behavior is normal. Judgment and thought content normal.  "trigeminy"  Procedure: EKG Indication: irreg beats Impression: NSR. No acute changes.   Lab Results  Component Value Date   WBC 5.8 08/17/2017   HGB 12.0 08/17/2017   HCT 35.5 08/17/2017   PLT 317 08/17/2017   GLUCOSE 98 08/17/2017   CHOL 163 07/31/2015   TRIG 70.0 07/31/2015   HDL 63.30 07/31/2015   LDLDIRECT 178.9 03/23/2013   LDLCALC 85 07/31/2015   ALT 7 08/17/2017   AST 15 08/17/2017   NA 137 08/17/2017   K 4.0 08/17/2017   CL 105 08/17/2017   CREATININE 0.73 08/17/2017   BUN 13 08/17/2017   CO2 27 08/17/2017   TSH 1.41 03/23/2013    Dg Chest 2 View  Result Date: 08/23/2017 CLINICAL DATA:  Chronic cough EXAM: CHEST - 2 VIEW COMPARISON:  01/29/2016 chest radiograph. FINDINGS: Stable cardiomediastinal silhouette with normal heart size. No pneumothorax. No pleural effusion. Lungs appear clear, with no acute consolidative airspace disease and no pulmonary edema. IMPRESSION: No active cardiopulmonary disease. Electronically Signed   By: Ilona Sorrel M.D.   On: 08/23/2017 23:20    Assessment & Plan:   There are no diagnoses linked to this encounter.   No orders of the defined types were placed in this encounter.    Follow-up: No follow-ups on file.  Walker Kehr, MD

## 2017-10-13 NOTE — Addendum Note (Signed)
Addended by: Cassandria Anger on: 10/13/2017 11:54 AM   Modules accepted: Orders, Level of Service

## 2017-10-13 NOTE — Assessment & Plan Note (Signed)
EKG - Nl Cardiol ref Dr Marlou Porch

## 2017-10-13 NOTE — Telephone Encounter (Signed)
Pt seen today

## 2017-10-20 ENCOUNTER — Other Ambulatory Visit: Payer: Self-pay | Admitting: Internal Medicine

## 2017-10-20 DIAGNOSIS — R300 Dysuria: Secondary | ICD-10-CM

## 2017-10-25 ENCOUNTER — Other Ambulatory Visit (INDEPENDENT_AMBULATORY_CARE_PROVIDER_SITE_OTHER): Payer: PPO

## 2017-10-25 DIAGNOSIS — R300 Dysuria: Secondary | ICD-10-CM | POA: Diagnosis not present

## 2017-10-25 LAB — URINALYSIS, ROUTINE W REFLEX MICROSCOPIC
BILIRUBIN URINE: NEGATIVE
Hgb urine dipstick: NEGATIVE
KETONES UR: NEGATIVE
NITRITE: NEGATIVE
PH: 6 (ref 5.0–8.0)
RBC / HPF: NONE SEEN (ref 0–?)
SPECIFIC GRAVITY, URINE: 1.01 (ref 1.000–1.030)
Total Protein, Urine: NEGATIVE
URINE GLUCOSE: NEGATIVE
UROBILINOGEN UA: 0.2 (ref 0.0–1.0)

## 2017-11-01 ENCOUNTER — Other Ambulatory Visit: Payer: Self-pay | Admitting: Internal Medicine

## 2017-11-01 MED ORDER — MIRABEGRON ER 50 MG PO TB24: 50.0000 mg | ORAL_TABLET | Freq: Every day | ORAL | 11 refills | Status: DC

## 2017-11-19 ENCOUNTER — Other Ambulatory Visit: Payer: Self-pay | Admitting: Internal Medicine

## 2017-11-19 MED ORDER — FAMOTIDINE 40 MG PO TABS: 40.0000 mg | ORAL_TABLET | Freq: Every day | ORAL | 3 refills | Status: DC

## 2017-12-06 ENCOUNTER — Ambulatory Visit: Payer: PPO | Admitting: Cardiology

## 2017-12-06 ENCOUNTER — Encounter: Payer: Self-pay | Admitting: Cardiology

## 2017-12-06 VITALS — BP 104/72 | HR 76 | Ht 67.0 in | Wt 123.4 lb

## 2017-12-06 DIAGNOSIS — R002 Palpitations: Secondary | ICD-10-CM

## 2017-12-06 DIAGNOSIS — I471 Supraventricular tachycardia: Secondary | ICD-10-CM | POA: Diagnosis not present

## 2017-12-06 NOTE — Patient Instructions (Signed)
Medication Instructions:  The current medical regimen is effective;  continue present plan and medications.  If you need a refill on your cardiac medications before your next appointment, please call your pharmacy.   Testing/Procedures: Your physician has requested that you have an echocardiogram. Echocardiography is a painless test that uses sound waves to create images of your heart. It provides your doctor with information about the size and shape of your heart and how well your heart's chambers and valves are working. This procedure takes approximately one hour. There are no restrictions for this procedure.  Your physician has recommended that you wear an event monitor for 14 days. Event monitors are medical devices that record the heart's electrical activity. Doctors most often [place] these monitors to diagnose arrhythmias. Arrhythmias are problems with the speed or rhythm of the heartbeat. The monitor is a small, portable device. You can wear one while you do your normal daily activities. This is usually used to diagnose what is causing palpitations/syncope (passing out).  Follow-Up: Follow up as needed after the above testing.  Thank you for choosing [facility]!!

## 2017-12-06 NOTE — Progress Notes (Signed)
Cardiology Office Note:    Date:  12/06/2017   ID:  Brittany [name], DOB September 30, 1945, MRN [id number]  PCP:  Brittany Anger, MD  Cardiologist:  No primary care provider on file.  Electrophysiologist:  None   Referring MD: Brittany Anger, MD     History of Present Illness:    Brittany [name] is a 72 y.o. female here for the evaluation of irregular heartbeat at the request of Dr. Alain [name].  In review of his note from 10/13/2017 she was feeling a regular beats intermittently with no other symptoms of chest pain shortness of breath.  EKG performed at that time was normal, personally reviewed.   She also had experience when she was at her oncologist and she felt no symptoms but her oncologist felt an irregular heartbeat as well.  She was trying to donate blood as well and was denied because of an irregular heartbeat.  Here today when auscultating her sitting up on the exam table I do hear an occasional ectopic beat as well as very brief run of potentially paroxysmal atrial tachycardia.  Once again she is asymptomatic.  She does not drink caffeine.  No other Sudafed for instance.  I found in the records back in 2015 a stress echocardiogram done in [facility] that stated that during the treadmill she did have brief periods of SVT, 11 beats for instance, asymptomatic.  Her echocardiogram was reassuring at that time.  She is about to go on a trip to [place].  She has not had any decreased exercise effort.  No chest pain, no syncope, no bleeding.   Past Medical History:  Diagnosis Date  . Acid reflux   . Anemia   . Arthritis   . Breast cancer ([facility]) 11/23/2007   right, ER/PR -, HER 2 -  . Cancer [facility])    breast right  triple recet neg, lumpect, chemo, XRT  . Esophageal stricture   . Gastritis 10/2007  . History of chemotherapy    neoadjuvant chemo, last dose 03/2008  . History of radiation therapy 07/25/08 -09/11/08   right breast  . Hx of colonic polyps 08/11/2016  .  Hyperlipidemia   . Migraines    rare  . Osteopenia     Past Surgical History:  Procedure Laterality Date  . BREAST LUMPECTOMY  06/26/2008   right, CA   XRT, chemo  . COLONOSCOPY    . EYE SURGERY     left eye cataract surgery 12/23/2015  . growth removed per right thigh    . JOINT REPLACEMENT  12/31/2015   RTK  . QUADRICEPS TENDON REPAIR Right 09/14/2016   Procedure: OPEN REPAIR QUADRICEP TENDON;  Surgeon: [name], MD;  Location: [facility] ORS;  Service: Orthopedics;  Laterality: Right;  . TOTAL KNEE ARTHROPLASTY Right 12/31/2015   Procedure: RIGHT TOTAL KNEE ARTHROPLASTY;  Surgeon: [name], MD;  Location: [facility] ORS;  Service: Orthopedics;  Laterality: Right;  . UPPER GASTROINTESTINAL ENDOSCOPY      Current Medications: Current Meds  Medication Sig  . acetaminophen (TYLENOL) 325 MG tablet Take 2 tablets (650 mg total) by mouth every 6 (six) hours as needed for mild pain (or Fever >/= 101).  . Ascorbic Acid (VITAMIN C PO) Take 1 tablet by mouth daily.  . Biotin 1000 MCG tablet Take 1,000 mcg by mouth daily.  . Cholecalciferol (VITAMIN D3) 1000 units CAPS Take 1,000 Units by mouth daily.  . citalopram (CELEXA) 40 MG tablet Take 1 tablet (40 mg total)  by mouth daily.  [name] denosumab (PROLIA) 60 MG/ML SOLN injection Inject 60 mg into the skin once. Administer in upper arm, thigh, or abdomen (Patient taking differently: Inject 60 mg into the skin every 6 (six) months. Administer in upper arm, thigh, or abdomen)  . diphenhydrAMINE (BENADRYL) 25 MG tablet Take 25 mg by mouth 2 (two) times daily as needed for allergies.   . famotidine (PEPCID) 40 MG tablet Take 1 tablet (40 mg total) by mouth daily.  . ferrous sulfate 325 (65 FE) MG tablet Take 1 tablet (325 mg total) by mouth 2 (two) times daily with a meal.  . mirabegron ER (MYRBETRIQ) 50 MG TB24 tablet Take 1 tablet (50 mg total) by mouth daily.  . Omega-3 Fatty Acids (FISH OIL) 1200 MG CAPS Take 1,200 mg by mouth daily.   . SUMAtriptan  (IMITREX) 100 MG tablet Take 1 tablet (100 mg total) by mouth once for 1 dose. May repeat in 2 hours if headache persists or recurs.  . vitamin B-12 (CYANOCOBALAMIN) 1000 MCG tablet Take 1,000 mcg by mouth daily.  [name] zolpidem (AMBIEN) 10 MG tablet Take 0.5-1 tablets (5-10 mg total) by mouth at bedtime as needed. for sleep     Allergies:   Boniva [ibandronic acid] and Lipitor [atorvastatin]   Social History   Socioeconomic History  . Marital status: Married    Spouse name: Not on file  . Number of children: 3  . Years of education: Not on file  . Highest education level: Not on file  Occupational History    Employer: RETIRED  Social Needs  . Financial resource strain: Not hard at all  . Food insecurity:    Worry: Never true    Inability: Never true  . Transportation needs:    Medical: No    Non-medical: No  Tobacco Use  . Smoking status: Former Smoker    Packs/day: 0.25    Years: 5.00    Pack years: 1.25    Types: Cigarettes    Last attempt to quit: 03/03/1967    Years since quitting: 50.7  . Smokeless tobacco: Never Used  . Tobacco comment: 6 cig per day when she was smoking/quit years ago  Substance and Sexual Activity  . Alcohol use: Yes    Alcohol/week: 7.0 standard drinks    Types: 7 Cans of beer per week    Comment: 1 beer per day   . Drug use: No  . Sexual activity: Yes    Partners: Male    Birth control/protection: Post-menopausal  Lifestyle  . Physical activity:    Days per week: 3 days    Minutes per session: 60 min  . Stress: Not at all  Relationships  . Social connections:    Talks on phone: More than three times a week    Gets together: More than three times a week    Attends religious service: More than 4 times per year    Active member of club or organization: Not on file    Attends meetings of clubs or organizations: More than 4 times per year    Relationship status: Married  Other Topics Concern  . Not on file  Social History Narrative   G3,  P3, 1st pregnancy age 25, menopause age 67, no HRT     Family History: The patient's family history includes Cancer in her father; Cancer (age of onset: 58) in her sister; Dementia in her mother; Epilepsy in her mother; Heart disease in her father.  ROS:   Please see the history of present illness.     All other systems reviewed and are negative.  EKGs/Labs/Other Studies Reviewed:    The following studies were reviewed today: Echocardiogram, stress echocardiogram, prior office note  EKG:  EKG is not ordered today.  Prior EKGs personally reviewed.  Recent Labs: 08/17/2017: ALT 7; Hemoglobin 12.0; Platelet Count 317 10/13/2017: BUN 12; Creatinine, Ser 0.69; Magnesium 1.9; Potassium 4.0; Sodium 137; TSH 1.37  Recent Lipid Panel    Component Value Date/Time   CHOL 163 07/31/2015 1153   TRIG 70.0 07/31/2015 1153   HDL 63.30 07/31/2015 1153   CHOLHDL 3 07/31/2015 1153   VLDL 14.0 07/31/2015 1153   LDLCALC 85 07/31/2015 1153   LDLDIRECT 178.9 03/23/2013 1059    Physical Exam:    VS:  BP 104/72   Pulse 76   Ht 5\' 7"  (1.702 m)   Wt 123 lb 6.4 oz (56 kg)   LMP 03/02/1994 (Approximate)   SpO2 98%   BMI 19.33 kg/m     Wt Readings from Last 3 Encounters:  12/06/17 123 lb 6.4 oz (56 kg)  10/13/17 124 lb (56.2 kg)  08/23/17 126 lb (57.2 kg)     GEN:  Thin in no acute distress HEENT: Normal NECK: No JVD; No carotid bruits LYMPHATICS: No lymphadenopathy CARDIAC: RRR, no murmurs, rubs, gallops, she did have occasional ectopy, short bursts. RESPIRATORY:  Clear to auscultation without rales, wheezing or rhonchi  ABDOMEN: Soft, non-tender, non-distended MUSCULOSKELETAL:  No edema; No deformity  SKIN: Warm and dry NEUROLOGIC:  Alert and oriented x 3 PSYCHIATRIC:  Normal affect   ASSESSMENT:    1. SVT (supraventricular tachycardia) (HCC)   2. Palpitations    PLAN:    In order of problems listed above:  Palpitations/irregular heartbeat/prior documented SVT on treadmill  portion of stress echocardiogram in 2015 -I will check a long-term event monitor 14 days.  She should be fine to go on her trip to [place].  She has been asymptomatic without any high risk features. -I will also check an echocardiogram to ensure that she is still maintaining proper structure and function of her heart. - For now continue with conservative management.  Discussed that occasionally we will give people beta-blockers or calcium channel blockers to help decrease palpitations/ectopy such as this.  Ultimately, I want to make sure that there is no evidence of atrial fibrillation present as this would require anticoagulation for instance.  We will follow-up with results of testing   Medication Adjustments/Labs and Tests Ordered: Current medicines are reviewed at length with the patient today.  Concerns regarding medicines are outlined above.  Orders Placed This Encounter  Procedures  . LONG TERM MONITOR (3-14 DAYS)  . ECHOCARDIOGRAM COMPLETE   No orders of the defined types were placed in this encounter.   Patient Instructions  Medication Instructions:  The current medical regimen is effective;  continue present plan and medications.  If you need a refill on your cardiac medications before your next appointment, please call your pharmacy.   Testing/Procedures: Your physician has requested that you have an echocardiogram. Echocardiography is a painless test that uses sound waves to create images of your heart. It provides your doctor with information about the size and shape of your heart and how well your heart's chambers and valves are working. This procedure takes approximately one hour. There are no restrictions for this procedure.  Your physician has recommended that you wear an event monitor for 14 days.  Event monitors are medical devices that record the heart's electrical activity. Doctors most often [place] these monitors to diagnose arrhythmias. Arrhythmias are problems with the  speed or rhythm of the heartbeat. The monitor is a small, portable device. You can wear one while you do your normal daily activities. This is usually used to diagnose what is causing palpitations/syncope (passing out).  Follow-Up: Follow up as needed after the above testing.  Thank you for choosing [facility]!!        Signed, Candee Furbish, MD  12/06/2017 10:51 AM    [place]

## 2017-12-29 ENCOUNTER — Other Ambulatory Visit: Payer: Self-pay

## 2017-12-29 ENCOUNTER — Ambulatory Visit (INDEPENDENT_AMBULATORY_CARE_PROVIDER_SITE_OTHER): Payer: PPO

## 2017-12-29 ENCOUNTER — Ambulatory Visit (HOSPITAL_COMMUNITY): Payer: PPO | Attending: Cardiology

## 2017-12-29 DIAGNOSIS — I471 Supraventricular tachycardia: Secondary | ICD-10-CM

## 2017-12-29 DIAGNOSIS — R002 Palpitations: Secondary | ICD-10-CM | POA: Insufficient documentation

## 2017-12-30 ENCOUNTER — Telehealth: Payer: Self-pay

## 2017-12-30 NOTE — Telephone Encounter (Signed)
-----   Message from [name], MD sent at 12/29/2017  5:33 PM EDT ----- Normal ejection fraction/pump function.  There is posterior leaflet prolapse but only trivial mitral regurgitation.  Continue to monitor clinically.  No indications for surgery. Candee Furbish, MD

## 2017-12-30 NOTE — Telephone Encounter (Signed)
Notes recorded by Frederik Schmidt, RN on 12/30/2017 at 9:19 AM EDT The patient has been notified of the result and verbalized understanding. All questions (if any) were answered. Frederik Schmidt, RN 12/30/2017 9:19 AM

## 2018-01-31 ENCOUNTER — Telehealth: Payer: Self-pay | Admitting: *Deleted

## 2018-01-31 ENCOUNTER — Ambulatory Visit: Payer: PPO | Admitting: Cardiology

## 2018-01-31 ENCOUNTER — Encounter: Payer: Self-pay | Admitting: Cardiology

## 2018-01-31 VITALS — BP 110/70 | HR 64 | Ht 67.0 in | Wt 127.0 lb

## 2018-01-31 DIAGNOSIS — G4700 Insomnia, unspecified: Secondary | ICD-10-CM

## 2018-01-31 DIAGNOSIS — R0683 Snoring: Secondary | ICD-10-CM | POA: Diagnosis not present

## 2018-01-31 DIAGNOSIS — I480 Paroxysmal atrial fibrillation: Secondary | ICD-10-CM

## 2018-01-31 DIAGNOSIS — R002 Palpitations: Secondary | ICD-10-CM

## 2018-01-31 DIAGNOSIS — I471 Supraventricular tachycardia: Secondary | ICD-10-CM | POA: Diagnosis not present

## 2018-01-31 DIAGNOSIS — K219 Gastro-esophageal reflux disease without esophagitis: Secondary | ICD-10-CM

## 2018-01-31 MED ORDER — APIXABAN 5 MG PO TABS: 5.0000 mg | ORAL_TABLET | Freq: Two times a day (BID) | ORAL | 11 refills | Status: DC

## 2018-01-31 NOTE — Patient Instructions (Signed)
Medication Instructions:  Please start Eliquis 5 mg one tablet twice a day. Continue all other medications as listed.  If you need a refill on your cardiac medications before your next appointment, please call your pharmacy.   Testing/Procedures: Your physician has recommended that you have a sleep study. This test records several body functions during sleep, including: brain activity, eye movement, oxygen and carbon dioxide blood levels, heart rate and rhythm, breathing rate and rhythm, the flow of air through your mouth and nose, snoring, body muscle movements, and chest and belly movement.  Follow-Up: At [facility], you and your health needs are our priority.  As part of our continuing mission to provide you with exceptional heart care, we have created designated Provider Care Teams.  These Care Teams include your primary Cardiologist (physician) and Advanced Practice Providers (APPs -  Physician Assistants and Nurse Practitioners) who all work together to provide you with the care you need, when you need it. You will need a follow up appointment in 3 months.  Please call our office 2 months in advance to schedule this appointment.  You may see Candee Furbish, MD or one of the following Advanced Practice Providers on your designated Care Team:   Truitt Merle, NP Cecilie Kicks, NP . Kathyrn Drown, NP  Thank you for choosing [facility]!!

## 2018-01-31 NOTE — Progress Notes (Signed)
Cardiology Office Note:    Date:  01/31/2018   ID:  Brittany [name], DOB 05-31-1945, MRN [id number]  PCP:  Cassandria Anger, MD  Cardiologist:  Candee Furbish, MD  Electrophysiologist:  None   Referring MD: Cassandria Anger, MD     History of Present Illness:    Brittany [name] is a 72 y.o. female here for follow-up of atrial fibrillation discovered on event monitor.  She had a brief episode of approximately 2 minutes duration but appears to be irregularly irregular with wandering baseline compatible with atrial fibrillation.  For several years she has felt irregular heartbeats intermittently.  At one point she was at her oncologist and was told that she had an irregular heartbeat at that time.  Back in 2015 she did have a treadmill stress test that showed brief periods of SVT done in [facility] which were asymptomatic.  Brittany [name]. Had a great time.   Overall once again she does not feel her palpitations.  She is without shortness of breath.  She climb 420 stairs.  No chest pain.  Past Medical History:  Diagnosis Date  . Acid reflux   . Anemia   . Arthritis   . Breast cancer ([place]) 11/23/2007   right, ER/PR -, HER 2 -  . Cancer [facility])    breast right  triple recet neg, lumpect, chemo, XRT  . Esophageal stricture   . Gastritis 10/2007  . History of chemotherapy    neoadjuvant chemo, last dose 03/2008  . History of radiation therapy 07/25/08 -09/11/08   right breast  . Hx of colonic polyps 08/11/2016  . Hyperlipidemia   . Migraines    rare  . Osteopenia     Past Surgical History:  Procedure Laterality Date  . BREAST LUMPECTOMY  06/26/2008   right, CA   XRT, chemo  . COLONOSCOPY    . EYE SURGERY     left eye cataract surgery 12/23/2015  . growth removed per right thigh    . JOINT REPLACEMENT  12/31/2015   RTK  . QUADRICEPS TENDON REPAIR Right 09/14/2016   Procedure: OPEN REPAIR QUADRICEP TENDON;  Surgeon: [name], MD;  Location: [facility];  Service: Orthopedics;   Laterality: Right;  . TOTAL KNEE ARTHROPLASTY Right 12/31/2015   Procedure: RIGHT TOTAL KNEE ARTHROPLASTY;  Surgeon: [name], MD;  Location: [facility];  Service: Orthopedics;  Laterality: Right;  . UPPER GASTROINTESTINAL ENDOSCOPY      Current Medications: Current Meds  Medication Sig  . acetaminophen (TYLENOL) 325 MG tablet Take 2 tablets (650 mg total) by mouth every 6 (six) hours as needed for mild pain (or Fever >/= 101).  . Ascorbic Acid (VITAMIN C PO) Take 1 tablet by mouth daily.  . Biotin 1000 MCG tablet Take 1,000 mcg by mouth daily.  . Cholecalciferol (VITAMIN D3) 1000 units CAPS Take 1,000 Units by mouth daily.  . citalopram (CELEXA) 40 MG tablet Take 1 tablet (40 mg total) by mouth daily.  [name] denosumab (PROLIA) 60 MG/ML SOLN injection Inject 60 mg into the skin once. Administer in upper arm, thigh, or abdomen  . diphenhydrAMINE (BENADRYL) 25 MG tablet Take 25 mg by mouth 2 (two) times daily as needed for allergies.   . famotidine (PEPCID) 40 MG tablet Take 1 tablet (40 mg total) by mouth daily.  . ferrous sulfate 325 (65 FE) MG tablet Take 1 tablet (325 mg total) by mouth 2 (two) times daily with a meal.  . Omega-3 Fatty  Acids (FISH OIL) 1200 MG CAPS Take 1,200 mg by mouth daily.   . vitamin B-12 (CYANOCOBALAMIN) 1000 MCG tablet Take 1,000 mcg by mouth daily.  [name] zolpidem (AMBIEN) 10 MG tablet Take 0.5-1 tablets (5-10 mg total) by mouth at bedtime as needed. for sleep     Allergies:   Boniva [ibandronic acid] and Lipitor [atorvastatin]   Social History   Socioeconomic History  . Marital status: Married    Spouse name: Not on file  . Number of children: 3  . Years of education: Not on file  . Highest education level: Not on file  Occupational History    Employer: RETIRED  Social Needs  . Financial resource strain: Not hard at all  . Food insecurity:    Worry: Never true    Inability: Never true  . Transportation needs:    Medical: No    Non-medical: No  Tobacco  Use  . Smoking status: Former Smoker    Packs/day: 0.25    Years: 5.00    Pack years: 1.25    Types: Cigarettes    Last attempt to quit: 03/03/1967    Years since quitting: 50.9  . Smokeless tobacco: Never Used  . Tobacco comment: 6 cig per day when she was smoking/quit years ago  Substance and Sexual Activity  . Alcohol use: Yes    Alcohol/week: 7.0 standard drinks    Types: 7 Cans of beer per week    Comment: 1 beer per day   . Drug use: No  . Sexual activity: Yes    Partners: Male    Birth control/protection: Post-menopausal  Lifestyle  . Physical activity:    Days per week: 3 days    Minutes per session: 60 min  . Stress: Not at all  Relationships  . Social connections:    Talks on phone: More than three times a week    Gets together: More than three times a week    Attends religious service: More than 4 times per year    Active member of club or organization: Not on file    Attends meetings of clubs or organizations: More than 4 times per year    Relationship status: Married  Other Topics Concern  . Not on file  Social History Narrative   G3, P3, 1st pregnancy age 41, menopause age 33, no HRT     Family History: The patient's family history includes Cancer in her father; Cancer (age of onset: 25) in her sister; Dementia in her mother; Epilepsy in her mother; Heart disease in her father.  ROS:   Please see the history of present illness.     All other systems reviewed and are negative.  EKGs/Labs/Other Studies Reviewed:    The following studies were reviewed today:  Long-term monitor on 12/29/2017:  Brief episode of paroxysmal atrial fibrillation noted with maximum heart rate 163 bpm. Episode lasted approximately 2 minutes.  Occasional ventricular couplets. Occasional PVCs  Slow ventricular run of approximately 79 bpm, 7 beats. Asymptomatic.  Brief 10 beats of paroxysmal atrial tachycardia  Paroxysmal atrial fibrillation episode noted-with her age greater  than 30 and female, chads vas score is 2. Please have her follow-up in clinic to discuss anticoagulation.  ECHO 12/29/17:  - Left ventricle: The cavity size was normal. Richardson thickness was   normal. Systolic function was normal. The estimated ejection   fraction was in the range of 55% to 60%. Left ventricular   diastolic function parameters were normal. - Aortic  valve: Sclerosis without stenosis. - Mitral valve: Leaflets are severely thickened and calcified with   posterior leaflet prolapse but only trivial MR. - Atrial septum: A patent foramen ovale cannot be excluded.  EKG:  EKG is not ordered today.    Recent Labs: 08/17/2017: ALT 7; Hemoglobin 12.0; Platelet Count 317 10/13/2017: BUN 12; Creatinine, Ser 0.69; Magnesium 1.9; Potassium 4.0; Sodium 137; TSH 1.37  Recent Lipid Panel    Component Value Date/Time   CHOL 163 07/31/2015 1153   TRIG 70.0 07/31/2015 1153   HDL 63.30 07/31/2015 1153   CHOLHDL 3 07/31/2015 1153   VLDL 14.0 07/31/2015 1153   LDLCALC 85 07/31/2015 1153   LDLDIRECT 178.9 03/23/2013 1059    Physical Exam:    VS:  BP 110/70   Pulse 64   Ht 5\' 7"  (1.702 m)   Wt 127 lb (57.6 kg)   LMP 03/02/1994 (Approximate)   SpO2 98%   BMI 19.89 kg/m     Wt Readings from Last 3 Encounters:  01/31/18 127 lb (57.6 kg)  12/06/17 123 lb 6.4 oz (56 kg)  10/13/17 124 lb (56.2 kg)     GEN:  Well nourished, well developed in no acute distress HEENT: Normal NECK: No JVD; No carotid bruits LYMPHATICS: No lymphadenopathy CARDIAC: RRR, no murmurs, rubs, gallops, occasional ectopy RESPIRATORY:  Clear to auscultation without rales, wheezing or rhonchi  ABDOMEN: Soft, non-tender, non-distended MUSCULOSKELETAL:  No edema; No deformity  SKIN: Warm and dry NEUROLOGIC:  Alert and oriented x 3 PSYCHIATRIC:  Normal affect   ASSESSMENT:    1. PAF (paroxysmal atrial fibrillation) ([place])   2. SVT (supraventricular tachycardia) ([facility])   3. Palpitations   4. Snoring     PLAN:    In order of problems listed above:  Paroxysmal atrial fibrillation - Approximately a 2-minute episode seen on event monitor.  Given her age greater than 74, female thus chads Vasc of 2 I do think that she warrants anticoagulation for stroke prevention. -Recent creatinine was 0.69, prior hemoglobin was 12.0-12.7. - She is asymptomatic with these episodes.  Her EF is normal.  Her blood pressure is on the low side at baseline. -She snores quite a bit, her husband wears earplugs, we will check a sleep study. - For now, I will not use any antiarrhythmic medications.  She could be a candidate for flecainide.  She is asymptomatic with her palpitations.  In the future, we will consider.  Multaq may be too expensive.    Paroxysmal atrial tachycardia -She also had episodes of PAT as well.  3 month follow up  Medication Adjustments/Labs and Tests Ordered: Current medicines are reviewed at length with the patient today.  Concerns regarding medicines are outlined above.  Orders Placed This Encounter  Procedures  . Split night study   Meds ordered this encounter  Medications  . apixaban (ELIQUIS) 5 MG TABS tablet    Sig: Take 1 tablet (5 mg total) by mouth 2 (two) times daily.    Dispense:  60 tablet    Refill:  11    Patient Instructions  Medication Instructions:  Please start Eliquis 5 mg one tablet twice a day. Continue all other medications as listed.  If you need a refill on your cardiac medications before your next appointment, please call your pharmacy.   Testing/Procedures: Your physician has recommended that you have a sleep study. This test records several body functions during sleep, including: brain activity, eye movement, oxygen and carbon dioxide blood levels, heart  rate and rhythm, breathing rate and rhythm, the flow of air through your mouth and nose, snoring, body muscle movements, and chest and belly movement.  Follow-Up: At [facility], you and your  health needs are our priority.  As part of our continuing mission to provide you with exceptional heart care, we have created designated Provider Care Teams.  These Care Teams include your primary Cardiologist (physician) and Advanced Practice Providers (APPs -  Physician Assistants and Nurse Practitioners) who all work together to provide you with the care you need, when you need it. You will need a follow up appointment in 3 months.  Please call our office 2 months in advance to schedule this appointment.  You may see Candee Furbish, MD or one of the following Advanced Practice Providers on your designated Care Team:   Truitt Merle, NP Cecilie Kicks, NP . Kathyrn Drown, NP  Thank you for choosing [facility]!!          Signed, Candee Furbish, MD  01/31/2018 3:24 PM    [place]

## 2018-01-31 NOTE — Telephone Encounter (Signed)
-----   Message from Shellia Cleverly, RN sent at 01/31/2018  3:25 PM EST ----- Regarding: sleep study Pt has been ordered to have a split night sleep study  Epworth sleepiness scale score of 2  Thanks - Pam

## 2018-02-01 ENCOUNTER — Telehealth: Payer: Self-pay | Admitting: *Deleted

## 2018-02-01 NOTE — Telephone Encounter (Signed)
PA submitted to [facility] via web portal for sleep study. 

## 2018-02-01 NOTE — Telephone Encounter (Signed)
Staff message sent to both [employer] and [employer] insurance denied in lab sleep study. Recommends HST, which does not needs PA.

## 2018-02-01 NOTE — Telephone Encounter (Signed)
-----   Message from Shellia Cleverly, RN sent at 01/31/2018  3:25 PM EST ----- Regarding: sleep study Pt has been ordered to have a split night sleep study  Epworth sleepiness scale score of 2  Thanks - Pam

## 2018-02-03 NOTE — Telephone Encounter (Signed)
RE: sleep study  [name], MD  Freada Bergeron, CMA  Cc: Shellia Cleverly, RN        Yes. Home sleep study is OK  Candee Furbish, MD     ----- Message -----  From: Freada Bergeron, CMA  Sent: 02/01/2018  6:22 PM EST  To: [name], MD, Shellia Cleverly, RN  Subject: Melton Alar: sleep study                  In lab denied is home sleep ok?  ----- Message -----  From: Lauralee Evener, CMA  Sent: 02/01/2018 11:24 AM EST  To: Freada Bergeron, CMA, Shellia Cleverly, RN  Subject: RE: sleep study                  [employer] denied in lab sleep study. Approved Home study. Please advise ordering provider. If agrees order HST.

## 2018-02-03 NOTE — Addendum Note (Signed)
Addended by: Freada Bergeron on: 02/03/2018 05:06 PM   Modules accepted: Orders

## 2018-02-07 NOTE — Telephone Encounter (Signed)
Let's have a further discussion about Eliquis. I understand her concerns.  Please have her come into clinic to discuss. Let's double book the 8am slot on Friday the13th.  Candee Furbish, MD

## 2018-02-09 NOTE — Telephone Encounter (Signed)
Patient is aware and agreeable to Home Sleep Study through [facility]. Patient is scheduled for 02/18/18 at 11:45 to pick up home sleep kit and meet with Respiratory therapist at [facility]. Patient is aware that if this appointment date and time does not work for them they should contact Artis Delay directly at [phone]. Patient is aware that a sleep packet will be sent from [facility] in week. Patient is agreeable to treatment and thankful for call.

## 2018-02-16 ENCOUNTER — Inpatient Hospital Stay: Payer: PPO | Attending: Oncology

## 2018-02-16 ENCOUNTER — Other Ambulatory Visit: Payer: Self-pay

## 2018-02-16 ENCOUNTER — Inpatient Hospital Stay: Payer: PPO

## 2018-02-16 DIAGNOSIS — Z170 Estrogen receptor positive status [ER+]: Principal | ICD-10-CM

## 2018-02-16 DIAGNOSIS — M810 Age-related osteoporosis without current pathological fracture: Secondary | ICD-10-CM | POA: Insufficient documentation

## 2018-02-16 DIAGNOSIS — Z853 Personal history of malignant neoplasm of breast: Secondary | ICD-10-CM | POA: Insufficient documentation

## 2018-02-16 DIAGNOSIS — C50811 Malignant neoplasm of overlapping sites of right female breast: Secondary | ICD-10-CM

## 2018-02-16 LAB — CMP (CANCER CENTER ONLY)
ALT: 13 U/L (ref 0–44)
AST: 16 U/L (ref 15–41)
Albumin: 3.9 g/dL (ref 3.5–5.0)
Alkaline Phosphatase: 65 U/L (ref 38–126)
Anion gap: 8 (ref 5–15)
BUN: 14 mg/dL (ref 8–23)
CHLORIDE: 103 mmol/L (ref 98–111)
CO2: 28 mmol/L (ref 22–32)
Calcium: 8.9 mg/dL (ref 8.9–10.3)
Creatinine: 0.72 mg/dL (ref 0.44–1.00)
GFR, Est AFR Am: >60 mL/min (ref >60)
GFR, Estimated: >60 mL/min (ref >60)
Glucose, Bld: 97 mg/dL (ref 70–99)
POTASSIUM: 3.9 mmol/L (ref 3.5–5.1)
SODIUM: 139 mmol/L (ref 135–145)
Total Bilirubin: 0.3 mg/dL (ref 0.3–1.2)
Total Protein: 6.9 g/dL (ref 6.5–8.1)

## 2018-02-16 LAB — CBC WITH DIFFERENTIAL (CANCER CENTER ONLY)
Abs Immature Granulocytes: 0.02 10*3/uL (ref 0.00–0.07)
Basophils Absolute: 0.1 10*3/uL (ref 0.0–0.1)
Basophils Relative: 1 %
Eosinophils Absolute: 0.3 10*3/uL (ref 0.0–0.5)
Eosinophils Relative: 5 %
HCT: 39.9 % (ref 36.0–46.0)
Hemoglobin: 13.2 g/dL (ref 12.0–15.0)
IMMATURE GRANULOCYTES: 0 %
LYMPHS PCT: 26 %
Lymphs Abs: 1.6 10*3/uL (ref 0.7–4.0)
MCH: 31.4 pg (ref 26.0–34.0)
MCHC: 33.1 g/dL (ref 30.0–36.0)
MCV: 95 fL (ref 80.0–100.0)
Monocytes Absolute: 0.5 10*3/uL (ref 0.1–1.0)
Monocytes Relative: 9 %
Neutro Abs: 3.6 10*3/uL (ref 1.7–7.7)
Neutrophils Relative %: 59 %
Platelet Count: 238 10*3/uL (ref 150–400)
RBC: 4.2 MIL/uL (ref 3.87–5.11)
RDW: 12.7 % (ref 11.5–15.5)
WBC Count: 6 10*3/uL (ref 4.0–10.5)
nRBC: 0 % (ref 0.0–0.2)

## 2018-02-16 MED ORDER — DENOSUMAB 60 MG/ML ~~LOC~~ SOSY
60.0000 mg | PREFILLED_SYRINGE | Freq: Once | SUBCUTANEOUS | Status: AC
  Administered 2018-02-16: 60 mg via SUBCUTANEOUS

## 2018-02-16 NOTE — Patient Instructions (Signed)

## 2018-02-17 NOTE — Telephone Encounter (Signed)
Discussed on phone and with Dr. Rayann Heman EP.  Very brief episode of AFIB (30 sec. ) CHADS VASC is 2. No anticoagulation for now.   Candee Furbish, MD

## 2018-02-18 ENCOUNTER — Ambulatory Visit (HOSPITAL_BASED_OUTPATIENT_CLINIC_OR_DEPARTMENT_OTHER): Payer: PPO

## 2018-02-18 ENCOUNTER — Ambulatory Visit (INDEPENDENT_AMBULATORY_CARE_PROVIDER_SITE_OTHER): Payer: PPO | Admitting: *Deleted

## 2018-02-18 VITALS — BP 104/60 | HR 70 | Resp 17 | Ht 67.0 in | Wt 128.0 lb

## 2018-02-18 DIAGNOSIS — Z0000 Encounter for general adult medical examination without abnormal findings: Secondary | ICD-10-CM | POA: Diagnosis not present

## 2018-02-18 NOTE — Progress Notes (Addendum)
Subjective:   Brittany [name] is a 72 y.o. female who presents for Medicare Annual (Subsequent) preventive examination.  Review of Systems:  No ROS.  Medicare Wellness Visit. Additional risk factors are reflected in the social history.  Cardiac Risk Factors include: advanced age (>30men, >75 women);dyslipidemia Sleep patterns: feels rested on waking, gets up 0-1 times nightly to void and sleeps 8 hours nightly.    Home Safety/Smoke Alarms: Feels safe in home. Smoke alarms in place.  Living environment; residence and Firearm Safety: 1-story house/ trailer, no firearms. Lives with husband, no needs for DME, good support system Seat Belt Safety/Bike Helmet: Wears seat belt.      Objective:     Vitals: BP 104/60   Pulse 70   Resp 17   Ht 5\' 7"  (1.702 m)   Wt 128 lb (58.1 kg)   LMP 03/02/1994 (Approximate)   SpO2 99%   BMI 20.05 kg/m   Body mass index is 20.05 kg/m.  Advanced Directives 02/18/2018 02/12/2017 09/14/2016 09/08/2016 07/21/2016 01/29/2016 12/31/2015  Does Patient Have a Medical Advance Directive? Yes Yes Yes Yes Yes No No  Type of Paramedic of [place];Living will [place];Living will [place];Living will [place];Living will Living will - -  Does patient want to make changes to medical advance directive? - - No - Patient declined No - Patient declined - - -  Copy of [place] in Chart? No - copy requested No - copy requested No - copy requested - - - -  Would patient like information on creating a medical advance directive? - - - - - No - Patient declined No - patient declined information    Tobacco Social History   Tobacco Use  Smoking Status Former Smoker  . Packs/day: 0.25  . Years: 5.00  . Pack years: 1.25  . Types: Cigarettes  . Last attempt to quit: 03/03/1967  . Years since quitting: 51.0  Smokeless Tobacco Never Used  Tobacco Comment   6 cig per day  when she was smoking/quit years ago     Counseling given: Not Answered Comment: 6 cig per day when she was smoking/quit years ago  Past Medical History:  Diagnosis Date  . Acid reflux   . Anemia   . Arthritis   . Breast cancer ([place]) 11/23/2007   right, ER/PR -, HER 2 -  . Cancer [facility])    breast right  triple recet neg, lumpect, chemo, XRT  . Esophageal stricture   . Gastritis 10/2007  . History of chemotherapy    neoadjuvant chemo, last dose 03/2008  . History of radiation therapy 07/25/08 -09/11/08   right breast  . Hx of colonic polyps 08/11/2016  . Hyperlipidemia   . Migraines    rare  . Osteopenia    Past Surgical History:  Procedure Laterality Date  . BREAST LUMPECTOMY  06/26/2008   right, CA   XRT, chemo  . COLONOSCOPY    . EYE SURGERY     left eye cataract surgery 12/23/2015  . growth removed per right thigh    . JOINT REPLACEMENT  12/31/2015   RTK  . QUADRICEPS TENDON REPAIR Right 09/14/2016   Procedure: OPEN REPAIR QUADRICEP TENDON;  Surgeon: [name], MD;  Location: [facility];  Service: Orthopedics;  Laterality: Right;  . TOTAL KNEE ARTHROPLASTY Right 12/31/2015   Procedure: RIGHT TOTAL KNEE ARTHROPLASTY;  Surgeon: [name], MD;  Location: [facility];  Service:  Orthopedics;  Laterality: Right;  . UPPER GASTROINTESTINAL ENDOSCOPY     Family History  Problem Relation Age of Onset  . Heart disease Father   . Cancer Father        lung  . Cancer Sister 44       Breast & Colorectal  . Epilepsy Mother   . Dementia Mother    Social History   Socioeconomic History  . Marital status: Married    Spouse name: Not on file  . Number of children: 3  . Years of education: Not on file  . Highest education level: Not on file  Occupational History    Employer: RETIRED  Social Needs  . Financial resource strain: Not hard at all  . Food insecurity:    Worry: Never true    Inability: Never true  . Transportation needs:    Medical: No    Non-medical: No  Tobacco Use   . Smoking status: Former Smoker    Packs/day: 0.25    Years: 5.00    Pack years: 1.25    Types: Cigarettes    Last attempt to quit: 03/03/1967    Years since quitting: 51.0  . Smokeless tobacco: Never Used  . Tobacco comment: 6 cig per day when she was smoking/quit years ago  Substance and Sexual Activity  . Alcohol use: Yes    Alcohol/week: 7.0 standard drinks    Types: 7 Cans of beer per week    Comment: 1 beer per day   . Drug use: No  . Sexual activity: Yes    Partners: Male    Birth control/protection: Post-menopausal  Lifestyle  . Physical activity:    Days per week: 3 days    Minutes per session: 60 min  . Stress: Not at all  Relationships  . Social connections:    Talks on phone: More than three times a week    Gets together: More than three times a week    Attends religious service: 1 to 4 times per year    Active member of club or organization: Yes    Attends meetings of clubs or organizations: More than 4 times per year    Relationship status: Married  Other Topics Concern  . Not on file  Social History Narrative   G3, P3, 1st pregnancy age 74, menopause age 8, no HRT    Outpatient Encounter Medications as of 02/18/2018  Medication Sig  . acetaminophen (TYLENOL) 325 MG tablet Take 2 tablets (650 mg total) by mouth every 6 (six) hours as needed for mild pain (or Fever >/= 101).  . Ascorbic Acid (VITAMIN C PO) Take 1 tablet by mouth daily.  . Biotin 1000 MCG tablet Take 1,000 mcg by mouth daily.  . Cholecalciferol (VITAMIN D3) 1000 units CAPS Take 1,000 Units by mouth daily.  . citalopram (CELEXA) 40 MG tablet Take 1 tablet (40 mg total) by mouth daily.  [name] denosumab (PROLIA) 60 MG/ML SOLN injection Inject 60 mg into the skin once. Administer in upper arm, thigh, or abdomen  . diphenhydrAMINE (BENADRYL) 25 MG tablet Take 25 mg by mouth 2 (two) times daily as needed for allergies.   . famotidine (PEPCID) 40 MG tablet Take 1 tablet (40 mg total) by mouth daily.    [name] levocetirizine (XYZAL) 5 MG tablet Take 5 mg by mouth every evening.  . Omega-3 Fatty Acids (FISH OIL) 1200 MG CAPS Take 1,200 mg by mouth daily.   . vitamin B-12 (CYANOCOBALAMIN) 1000 MCG tablet  Take 1,000 mcg by mouth daily.  [name] zolpidem (AMBIEN) 10 MG tablet Take 0.5-1 tablets (5-10 mg total) by mouth at bedtime as needed. for sleep  . apixaban (ELIQUIS) 5 MG TABS tablet Take 1 tablet (5 mg total) by mouth 2 (two) times daily. (Patient not taking: Reported on 02/18/2018)  . SUMAtriptan (IMITREX) 100 MG tablet Take 1 tablet (100 mg total) by mouth once for 1 dose. May repeat in 2 hours if headache persists or recurs.  . [DISCONTINUED] ferrous sulfate 325 (65 FE) MG tablet Take 1 tablet (325 mg total) by mouth 2 (two) times daily with a meal. (Patient not taking: Reported on 02/18/2018)   No facility-administered encounter medications on file as of 02/18/2018.     Activities of Daily Living In your present state of health, do you have any difficulty performing the following activities: 02/18/2018  Hearing? N  Vision? N  Difficulty concentrating or making decisions? N  Walking or climbing stairs? N  Dressing or bathing? N  Doing errands, shopping? N  Preparing Food and eating ? N  Using the Toilet? N  In the past six months, have you accidently leaked urine? N  Do you have problems with loss of bowel control? N  Managing your Medications? N  Managing your Finances? N  Housekeeping or managing your Housekeeping? N  Some recent data might be hidden    Patient Care Team: Plotnikov, Evie Lacks, MD as PCP - General [name], MD as PCP - Cardiology (Cardiology) [name], [name], MD as Consulting Physician (Hematology and Oncology) Neldon Mc, MD as Surgeon (General Surgery) Thea Silversmith, MD as Consulting Physician (Radiation Oncology) Holley Bouche, NP (Inactive) as Nurse Practitioner (Nurse Practitioner)    Assessment:   This is a routine wellness  examination for [place]. Physical assessment deferred to PCP.   Exercise Activities and Dietary recommendations Current Exercise Habits: Structured exercise class, Type of exercise: walking;calisthenics;strength training/weights, Time (Minutes): 50, Frequency (Times/Week): 4, Weekly Exercise (Minutes/Week): 200, Intensity: Mild, Exercise limited by: orthopedic condition(s)  Diet (meal preparation, eat out, water intake, caffeinated beverages, dairy products, fruits and vegetables): in general, a "healthy" diet  , well balanced eats a variety of fruits and vegetables daily, limits salt, fat/cholesterol, sugar,carbohydrates,caffeine, drinks 6-8 glasses of water daily.  Goals    . Patient Stated     Maintain current health status by continuing to exercise and eat well, enjoy family and life.    . Patient Stated     Maintain current health status and [place]       Fall Risk Fall Risk  02/18/2018 02/12/2017 07/28/2016 09/25/2014  Falls in the past year? 0 Yes No No  Number falls in past yr: - 1 - -  Injury with Fall? - No - -    Depression Screen PHQ 2/9 Scores 02/18/2018 02/12/2017 07/28/2016  PHQ - 2 Score 0 0 0  PHQ- 9 Score 0 0 -     Cognitive Function MMSE - Mini Mental State Exam 02/18/2018 02/12/2017  Orientation to time 5 5  Orientation to Place 5 5  Registration 3 3  Attention/ Calculation 5 5  Recall 3 3  Language- name 2 objects 2 2  Language- repeat 1 1  Language- follow 3 step command 3 3  Language- read & follow direction 1 1  Write a sentence 1 1  Copy design 1 1  Total score 30 30        Immunization History  Administered  Date(s) Administered  . Influenza Split 12/01/2011  . Influenza Whole 12/06/2007, 11/15/2008, 01/06/2010, 11/11/2010  . Influenza, High Dose Seasonal PF 12/20/2012, 11/20/2014, 11/21/2017  . Influenza,inj,Quad PF,6+ Mos 11/30/2013  . Influenza-Unspecified 11/27/2015, 10/31/2016  . Pneumococcal Conjugate-13 03/22/2013  .  Pneumococcal Polysaccharide-23 01/31/2009, 07/31/2015  . Rabies, IM 03/04/2015, 03/07/2015, 03/11/2015, 03/18/2015  . Td 10/09/2009  . Tdap 03/04/2015  . Zoster 04/18/2009  . Zoster Recombinat (Shingrix) 02/13/2017, 07/03/2017   Screening Tests Health Maintenance  Topic Date Due  . MAMMOGRAM  05/14/2018  . COLONOSCOPY  08/05/2019  . TETANUS/TDAP  03/03/2025  . INFLUENZA VACCINE  Completed  . DEXA SCAN  Completed  . Hepatitis C Screening  Completed  . PNA vac Low Risk Adult  Completed      Plan:     Continue doing brain stimulating activities (puzzles, reading, adult coloring books, staying active) to keep memory sharp.   Continue to eat heart healthy diet (full of fruits, vegetables, whole grains, lean protein, water--limit salt, fat, and sugar intake) and increase physical activity as tolerated.  I have personally reviewed and noted the following in the patient's chart:   . Medical and social history . Use of alcohol, tobacco or illicit drugs  . Current medications and supplements . Functional ability and status . Nutritional status . Physical activity . Advanced directives . List of other physicians . Vitals . Screenings to include cognitive, depression, and falls . Referrals and appointments  In addition, I have reviewed and discussed with patient certain preventive protocols, quality metrics, and best practice recommendations. A written personalized care plan for preventive services as well as general preventive health recommendations were provided to patient.     Michiel Cowboy, RN  02/18/2018  Medical screening examination/treatment/procedure(s) were performed by non-physician practitioner and as supervising physician I was immediately available for consultation/collaboration. I agree with above. Lew Dawes, MD

## 2018-02-18 NOTE — Patient Instructions (Addendum)
Continue doing brain stimulating activities (puzzles, reading, adult coloring books, staying active) to keep memory sharp.   Continue to eat heart healthy diet (full of fruits, vegetables, whole grains, lean protein, water--limit salt, fat, and sugar intake) and increase physical activity as tolerated.   Brittany [name] , Thank you for taking time to come for your Medicare Wellness Visit. I appreciate your ongoing commitment to your health goals. Please review the following plan we discussed and let me know if I can assist you in the future.   These are the goals we discussed: Goals    . Patient Stated     Maintain current health status by continuing to exercise and eat well, enjoy family and life.    . Patient Stated     Maintain current health status and [place]       This is a list of the screening recommended for you and due dates:  Health Maintenance  Topic Date Due  . Mammogram  05/14/2018  . Colon Cancer Screening  08/05/2019  . Tetanus Vaccine  03/03/2025  . Flu Shot  Completed  . DEXA scan (bone density measurement)  Completed  .  Hepatitis C: One time screening is recommended by Center for Disease Control  (CDC) for  adults born from [birth year] through 1963.   Completed  . Pneumonia vaccines  Completed   Health Maintenance, Female Adopting a healthy lifestyle and getting preventive care can go a long way to promote health and wellness. Talk with your health care provider about what schedule of regular examinations is right for you. This is a good chance for you to check in with your provider about disease prevention and staying healthy. In between checkups, there are plenty of things you can do on your own. Experts have done a lot of research about which lifestyle changes and preventive measures are most likely to keep you healthy. Ask your health care provider for more information. Weight and diet Eat a healthy diet  Be sure to include plenty of vegetables, fruits,  low-fat dairy products, and lean protein.  Do not eat a lot of foods high in solid fats, added sugars, or salt.  Get regular exercise. This is one of the most important things you can do for your health. ? Most adults should exercise for at least 150 minutes each week. The exercise should increase your heart rate and make you sweat (moderate-intensity exercise). ? Most adults should also do strengthening exercises at least twice a week. This is in addition to the moderate-intensity exercise. Maintain a healthy weight  Body mass index (BMI) is a measurement that can be used to identify possible weight problems. It estimates body fat based on height and weight. Your health care provider can help determine your BMI and help you achieve or maintain a healthy weight.  For females 30 years of age and older: ? A BMI below 18.5 is considered underweight. ? A BMI of 18.5 to 24.9 is normal. ? A BMI of 25 to 29.9 is considered overweight. ? A BMI of 30 and above is considered obese. Watch levels of cholesterol and blood lipids  You should start having your blood tested for lipids and cholesterol at 70 years of age, then have this test every 5 years.  You may need to have your cholesterol levels checked more often if: ? Your lipid or cholesterol levels are high. ? You are older than 70 years of age. ? You are at high risk  for heart disease. Cancer screening Lung Cancer  Lung cancer screening is recommended for adults 35-52 years old who are at high risk for lung cancer because of a history of smoking.  A yearly low-dose CT scan of the lungs is recommended for people who: ? Currently smoke. ? Have quit within the past 15 years. ? Have at least a 30-pack-year history of smoking. A pack year is smoking an average of one pack of cigarettes a day for 1 year.  Yearly screening should continue until it has been 15 years since you quit.  Yearly screening should stop if you develop a health problem  that would prevent you from having lung cancer treatment. Breast Cancer  Practice breast self-awareness. This means understanding how your breasts normally appear and feel.  It also means doing regular breast self-exams. Let your health care provider know about any changes, no matter how small.  If you are in your 20s or 30s, you should have a clinical breast exam (CBE) by a health care provider every 1-3 years as part of a regular health exam.  If you are 44 or older, have a CBE every year. Also consider having a breast X-ray (mammogram) every year.  If you have a family history of breast cancer, talk to your health care provider about genetic screening.  If you are at high risk for breast cancer, talk to your health care provider about having an MRI and a mammogram every year.  Breast cancer gene (BRCA) assessment is recommended for women who have family members with BRCA-related cancers. BRCA-related cancers include: ? Breast. ? Ovarian. ? Tubal. ? Peritoneal cancers.  Results of the assessment will determine the need for genetic counseling and BRCA1 and BRCA2 testing. Cervical Cancer Your health care provider may recommend that you be screened regularly for cancer of the pelvic organs (ovaries, uterus, and vagina). This screening involves a pelvic examination, including checking for microscopic changes to the surface of your cervix (Pap test). You may be encouraged to have this screening done every 3 years, beginning at age 33.  For women ages 31-65, health care providers may recommend pelvic exams and Pap testing every 3 years, or they may recommend the Pap and pelvic exam, combined with testing for human papilloma virus (HPV), every 5 years. Some types of HPV increase your risk of cervical cancer. Testing for HPV may also be done on women of any age with unclear Pap test results.  Other health care providers may not recommend any screening for nonpregnant women who are considered low  risk for pelvic cancer and who do not have symptoms. Ask your health care provider if a screening pelvic exam is right for you.  If you have had past treatment for cervical cancer or a condition that could lead to cancer, you need Pap tests and screening for cancer for at least 20 years after your treatment. If Pap tests have been discontinued, your risk factors (such as having a new sexual partner) need to be reassessed to determine if screening should resume. Some women have medical problems that increase the chance of getting cervical cancer. In these cases, your health care provider may recommend more frequent screening and Pap tests. Colorectal Cancer  This type of cancer can be detected and often prevented.  Routine colorectal cancer screening usually begins at 70 years of age and continues through 70 years of age.  Your health care provider may recommend screening at an earlier age if you have risk factors  for colon cancer.  Your health care provider may also recommend using home test kits to check for hidden blood in the stool.  A small camera at the end of a tube can be used to examine your colon directly (sigmoidoscopy or colonoscopy). This is done to check for the earliest forms of colorectal cancer.  Routine screening usually begins at age 41.  Direct examination of the colon should be repeated every 5-10 years through 70 years of age. However, you may need to be screened more often if early forms of precancerous polyps or small growths are found. Skin Cancer  Check your skin from head to toe regularly.  Tell your health care provider about any new moles or changes in moles, especially if there is a change in a mole's shape or color.  Also tell your health care provider if you have a mole that is larger than the size of a pencil eraser.  Always use sunscreen. Apply sunscreen liberally and repeatedly throughout the day.  Protect yourself by wearing long sleeves, pants, a  wide-brimmed hat, and sunglasses whenever you are outside. Heart disease, diabetes, and high blood pressure  High blood pressure causes heart disease and increases the risk of stroke. High blood pressure is more likely to develop in: ? People who have blood pressure in the high end of the normal range (130-139/85-89 mm Hg). ? People who are overweight or obese. ? People who are African American.  If you are 60-69 years of age, have your blood pressure checked every 3-5 years. If you are 72 years of age or older, have your blood pressure checked every year. You should have your blood pressure measured twice-once when you are at a hospital or clinic, and once when you are not at a hospital or clinic. Record the average of the two measurements. To check your blood pressure when you are not at a hospital or clinic, you can use: ? An automated blood pressure machine at a pharmacy. ? A home blood pressure monitor.  If you are between 42 years and 22 years old, ask your health care provider if you should take aspirin to prevent strokes.  Have regular diabetes screenings. This involves taking a blood sample to check your fasting blood sugar level. ? If you are at a normal weight and have a low risk for diabetes, have this test once every three years after 70 years of age. ? If you are overweight and have a high risk for diabetes, consider being tested at a younger age or more often. Preventing infection Hepatitis B  If you have a higher risk for hepatitis B, you should be screened for this virus. You are considered at high risk for hepatitis B if: ? You were born in a country where hepatitis B is common. Ask your health care provider which countries are considered high risk. ? Your parents were born in a high-risk country, and you have not been immunized against hepatitis B (hepatitis B vaccine). ? You have HIV or AIDS. ? You use needles to inject street drugs. ? You live with someone who has  hepatitis B. ? You have had sex with someone who has hepatitis B. ? You get hemodialysis treatment. ? You take certain medicines for conditions, including cancer, organ transplantation, and autoimmune conditions. Hepatitis C  Blood testing is recommended for: ? Everyone born from [birth year] through 1963. ? Anyone with known risk factors for hepatitis C. Sexually transmitted infections (STIs)  You should be screened  for sexually transmitted infections (STIs) including gonorrhea and chlamydia if: ? You are sexually active and are younger than 70 years of age. ? You are older than 70 years of age and your health care provider tells you that you are at risk for this type of infection. ? Your sexual activity has changed since you were last screened and you are at an increased risk for chlamydia or gonorrhea. Ask your health care provider if you are at risk.  If you do not have HIV, but are at risk, it may be recommended that you take a prescription medicine daily to prevent HIV infection. This is called pre-exposure prophylaxis (PrEP). You are considered at risk if: ? You are sexually active and do not regularly use condoms or know the HIV status of your partner(s). ? You take drugs by injection. ? You are sexually active with a partner who has HIV. Talk with your health care provider about whether you are at high risk of being infected with HIV. If you choose to begin PrEP, you should first be tested for HIV. You should then be tested every 3 months for as long as you are taking PrEP. Pregnancy  If you are premenopausal and you may become pregnant, ask your health care provider about preconception counseling.  If you may become pregnant, take 400 to 800 micrograms (mcg) of folic acid every day.  If you want to prevent pregnancy, talk to your health care provider about birth control (contraception). Osteoporosis and menopause  Osteoporosis is a disease in which the bones lose minerals and strength  with aging. This can result in serious bone fractures. Your risk for osteoporosis can be identified using a bone density scan.  If you are 23 years of age or older, or if you are at risk for osteoporosis and fractures, ask your health care provider if you should be screened.  Ask your health care provider whether you should take a calcium or vitamin D supplement to lower your risk for osteoporosis.  Menopause may have certain physical symptoms and risks.  Hormone replacement therapy may reduce some of these symptoms and risks. Talk to your health care provider about whether hormone replacement therapy is right for you. Follow these instructions at home:  Schedule regular health, dental, and eye exams.  Stay current with your immunizations.  Do not use any tobacco products including cigarettes, chewing tobacco, or electronic cigarettes.  If you are pregnant, do not drink alcohol.  If you are breastfeeding, limit how much and how often you drink alcohol.  Limit alcohol intake to no more than 1 drink per day for nonpregnant women. One drink equals 12 ounces of beer, 5 ounces of wine, or 1 ounces of hard liquor.  Do not use street drugs.  Do not share needles.  Ask your health care provider for help if you need support or information about quitting drugs.  Tell your health care provider if you often feel depressed.  Tell your health care provider if you have ever been abused or do not feel safe at home. This information is not intended to replace advice given to you by your health care provider. Make sure you discuss any questions you have with your health care provider. Document Released: 09/01/2010 Document Revised: 07/25/2015 Document Reviewed: 11/20/2014 Elsevier Interactive Patient Education  2019 [employer].

## 2018-03-04 ENCOUNTER — Ambulatory Visit (HOSPITAL_BASED_OUTPATIENT_CLINIC_OR_DEPARTMENT_OTHER): Payer: PPO | Attending: Cardiology | Admitting: Cardiology

## 2018-03-04 DIAGNOSIS — K219 Gastro-esophageal reflux disease without esophagitis: Secondary | ICD-10-CM | POA: Diagnosis not present

## 2018-03-04 DIAGNOSIS — R0902 Hypoxemia: Secondary | ICD-10-CM | POA: Diagnosis not present

## 2018-03-04 DIAGNOSIS — G4700 Insomnia, unspecified: Secondary | ICD-10-CM | POA: Insufficient documentation

## 2018-03-04 DIAGNOSIS — G4733 Obstructive sleep apnea (adult) (pediatric): Secondary | ICD-10-CM

## 2018-03-20 NOTE — Procedures (Signed)
   Patient Name: Brittany [name], Brittany [name] Date: 03/04/2018 Gender: Female D.O.B: [birth year] Age (years): 4 Referring Provider: [name] MD, ABSM Height (inches): 67 Interpreting Physician: [name] MD, ABSM Weight (lbs): 124 RPSGT: Jacolyn Reedy BMI: 19 MRN: [id number] Neck Size: 14.00  CLINICAL INFORMATION Sleep Study Type: HST  Indication for sleep study: N/A  Epworth Sleepiness Score: 3  Most recent polysomnogram was on 02/19/2018.  SLEEP STUDY TECHNIQUE A multi-channel overnight portable sleep study was performed. The channels recorded were: nasal airflow, thoracic respiratory movement, and oxygen saturation with a pulse oximetry. Snoring was also monitored.  MEDICATIONS Patient self administered medications include: N/A.  SLEEP ARCHITECTURE Patient was studied for 377.2 minutes. The sleep efficiency was 99.2 % and the patient was supine for 100%. The arousal index was 0.0 per hour.  RESPIRATORY PARAMETERS The overall AHI was 34.4 per hour, with a central apnea index of 0.0 per hour.  The oxygen nadir was 81% during sleep.  CARDIAC DATA Mean heart rate during sleep was 71.6 bpm.  IMPRESSIONS - Severe obstructive sleep apnea occurred during this study (AHI = 34.4/h). - No significant central sleep apnea occurred during this study (CAI = 0.0/h). - Moderate oxygen desaturation was noted during this study (Min O2 = 81%). - Patient snored 7.6% during the sleep.  DIAGNOSIS - Obstructive Sleep Apnea (327.23 [G47.33 ICD-10]) - Nocturnal Hypoxemia (327.26 [G47.36 ICD-10])  RECOMMENDATIONS - Recommend in lab CPAP titration due to severity of sleep disordered breathing.  - Positional therapy avoiding supine position during sleep. - Avoid alcohol, sedatives and other CNS depressants that may worsen sleep apnea and disrupt normal sleep architecture. - Sleep hygiene should be reviewed to assess factors that may improve sleep quality. - Weight management and regular  exercise should be initiated or continued.  [Electronically signed] 03/20/2018 06:27 PM  [name] MD, ABSM Diplomate, American Board of Sleep Medicine

## 2018-03-21 ENCOUNTER — Telehealth: Payer: Self-pay | Admitting: *Deleted

## 2018-03-21 DIAGNOSIS — G4733 Obstructive sleep apnea (adult) (pediatric): Secondary | ICD-10-CM

## 2018-03-21 NOTE — Telephone Encounter (Signed)
-----   Message from [name], MD sent at 03/20/2018  6:30 PM EST ----- Please let patient know that they have sleep apnea and recommend CPAP titration. Please set up titration in the sleep lab.

## 2018-03-21 NOTE — Telephone Encounter (Signed)
Informed patient of sleep study results and patient understanding was verbalized. Patient understands her sleep study showed  they have sleep apnea and recommend CPAP titration. Please set up titration in the sleep lab.    Pt is aware and agreeable to results.

## 2018-03-22 ENCOUNTER — Telehealth: Payer: Self-pay | Admitting: *Deleted

## 2018-03-22 NOTE — Telephone Encounter (Signed)
-----   Message from Freada Bergeron, [place] sent at 03/21/2018  5:54 PM EST ----- Regarding: precert recommend CPAP titration.

## 2018-03-22 NOTE — Telephone Encounter (Signed)
PA for CPAP titration submitted via web portal to [facility].

## 2018-03-23 ENCOUNTER — Telehealth: Payer: Self-pay | Admitting: *Deleted

## 2018-03-23 NOTE — Telephone Encounter (Signed)
-----   Message from Freada Bergeron, [place] sent at 03/21/2018  5:54 PM EST ----- Regarding: precert recommend CPAP titration.

## 2018-03-23 NOTE — Telephone Encounter (Signed)
Staff message sent to Tomi Bamberger received for CPAP titration. Ok to schedule. Auth # [id number]. Valid dates 03/22/18 to 06/20/18.

## 2018-03-29 NOTE — Telephone Encounter (Signed)
Patient is scheduled for CPAP Titration on 05/04/18. Patient understands her titration study will be done at [facility] sleep lab. Patient understands she will receive a letter in a week or so detailing appointment, date, time, and location. Patient understands to call if she does not receive the letter  in a timely manner. Left detailed message on voicemail with date and time of titration and informed patient to call back to confirm or reschedule.

## 2018-04-01 ENCOUNTER — Encounter: Payer: Self-pay | Admitting: *Deleted

## 2018-04-11 ENCOUNTER — Ambulatory Visit (INDEPENDENT_AMBULATORY_CARE_PROVIDER_SITE_OTHER)
Admission: RE | Admit: 2018-04-11 | Discharge: 2018-04-11 | Disposition: A | Discharge status: Home / Self Care | Payer: PPO | Source: Ambulatory Visit | Attending: Internal Medicine | Admitting: Internal Medicine

## 2018-04-11 ENCOUNTER — Encounter: Payer: Self-pay | Admitting: Internal Medicine

## 2018-04-11 ENCOUNTER — Ambulatory Visit (INDEPENDENT_AMBULATORY_CARE_PROVIDER_SITE_OTHER): Payer: PPO | Admitting: Internal Medicine

## 2018-04-11 VITALS — BP 118/74 | HR 70 | Temp 98.3°F | Ht 67.0 in | Wt 126.0 lb

## 2018-04-11 DIAGNOSIS — R109 Unspecified abdominal pain: Secondary | ICD-10-CM | POA: Diagnosis not present

## 2018-04-11 DIAGNOSIS — Z86010 Personal history of colonic polyps: Secondary | ICD-10-CM | POA: Diagnosis not present

## 2018-04-11 DIAGNOSIS — E538 Deficiency of other specified B group vitamins: Secondary | ICD-10-CM | POA: Diagnosis not present

## 2018-04-11 DIAGNOSIS — R140 Abdominal distension (gaseous): Secondary | ICD-10-CM

## 2018-04-11 DIAGNOSIS — K5900 Constipation, unspecified: Secondary | ICD-10-CM | POA: Diagnosis not present

## 2018-04-11 MED ORDER — SACCHAROMYCES BOULARDII 250 MG PO CAPS: 250.0000 mg | ORAL_CAPSULE | Freq: Two times a day (BID) | ORAL | 1 refills | Status: DC

## 2018-04-11 MED ORDER — METRONIDAZOLE 500 MG PO TABS: 500.0000 mg | ORAL_TABLET | Freq: Three times a day (TID) | ORAL | 0 refills | Status: DC

## 2018-04-11 NOTE — Assessment & Plan Note (Signed)
On B12 

## 2018-04-11 NOTE — Progress Notes (Signed)
Subjective:  Patient ID: Brittany [name], female    DOB: [birth year]  Age: 73 y.o. MRN: [id number]  CC: No chief complaint on file.   HPI Shell Blanchette Leahy presents for constipation x3 mo C/o bloating,  gas, loose stools - going several times a day, gas after Izora Gala started to use Senakot, Miralax Colon 07/2016   Outpatient Medications Prior to Visit  Medication Sig Dispense Refill  . acetaminophen (TYLENOL) 325 MG tablet Take 2 tablets (650 mg total) by mouth every 6 (six) hours as needed for mild pain (or Fever >/= 101).    [name] apixaban (ELIQUIS) 5 MG TABS tablet Take 1 tablet (5 mg total) by mouth 2 (two) times daily. 60 tablet 11  . Ascorbic Acid (VITAMIN C PO) Take 1 tablet by mouth daily.    . Biotin 1000 MCG tablet Take 1,000 mcg by mouth daily.    . Cholecalciferol (VITAMIN D3) 1000 units CAPS Take 1,000 Units by mouth daily.    . citalopram (CELEXA) 40 MG tablet Take 1 tablet (40 mg total) by mouth daily. 90 tablet 3  . denosumab (PROLIA) 60 MG/ML SOLN injection Inject 60 mg into the skin once. Administer in upper arm, thigh, or abdomen 1.8 mL 1  . diphenhydrAMINE (BENADRYL) 25 MG tablet Take 25 mg by mouth 2 (two) times daily as needed for allergies.     . famotidine (PEPCID) 40 MG tablet Take 1 tablet (40 mg total) by mouth daily. 90 tablet 3  . levocetirizine (XYZAL) 5 MG tablet Take 5 mg by mouth every evening.    . Omega-3 Fatty Acids (FISH OIL) 1200 MG CAPS Take 1,200 mg by mouth daily.     . vitamin B-12 (CYANOCOBALAMIN) 1000 MCG tablet Take 1,000 mcg by mouth daily.    [name] zolpidem (AMBIEN) 10 MG tablet Take 0.5-1 tablets (5-10 mg total) by mouth at bedtime as needed. for sleep 90 tablet 0  . SUMAtriptan (IMITREX) 100 MG tablet Take 1 tablet (100 mg total) by mouth once for 1 dose. May repeat in 2 hours if headache persists or recurs. 9 tablet 5   No facility-administered medications prior to visit.     ROS: Review of Systems  Constitutional: Negative for activity change,  appetite change, chills, fatigue and unexpected weight change.  HENT: Negative for congestion, mouth sores and sinus pressure.   Eyes: Negative for visual disturbance.  Respiratory: Negative for cough and chest tightness.   Gastrointestinal: Positive for abdominal distention and diarrhea. Negative for abdominal pain, anal bleeding, blood in stool, constipation, nausea, rectal pain and vomiting.  Genitourinary: Negative for difficulty urinating, frequency and vaginal pain.  Musculoskeletal: Negative for back pain and gait problem.  Skin: Negative for pallor and rash.  Neurological: Negative for dizziness, tremors, weakness, numbness and headaches.  Psychiatric/Behavioral: Negative for confusion and sleep disturbance.    Objective:  BP 118/74 (BP Location: Left Arm, Patient Position: Sitting, Cuff Size: Normal)   Pulse 70   Temp 98.3 F (36.8 C) (Oral)   Ht 5\' 7"  (1.702 m)   Wt 126 lb (57.2 kg)   LMP 03/02/1994 (Approximate)   SpO2 97%   BMI 19.73 kg/m   BP Readings from Last 3 Encounters:  04/11/18 118/74  02/18/18 104/60  01/31/18 110/70    Wt Readings from Last 3 Encounters:  04/11/18 126 lb (57.2 kg)  02/18/18 128 lb (58.1 kg)  01/31/18 127 lb (57.6 kg)    Physical Exam Constitutional:      General:  She is not in acute distress.    Appearance: She is well-developed.  HENT:     Head: Normocephalic.     Right Ear: External ear normal.     Left Ear: External ear normal.     Nose: Nose normal.  Eyes:     General:        Right eye: No discharge.        Left eye: No discharge.     Conjunctiva/sclera: Conjunctivae normal.     Pupils: Pupils are equal, round, and reactive to light.  Neck:     Musculoskeletal: Normal range of motion and neck supple.     Thyroid: No thyromegaly.     Vascular: No JVD.     Trachea: No tracheal deviation.  Cardiovascular:     Rate and Rhythm: Normal rate and regular rhythm.     Heart sounds: Normal heart sounds.  Pulmonary:      Effort: No respiratory distress.     Breath sounds: No stridor. No wheezing.  Abdominal:     General: Bowel sounds are normal. There is no distension.     Palpations: Abdomen is soft. There is no mass.     Tenderness: There is no abdominal tenderness. There is no guarding or rebound.  Musculoskeletal:        General: No tenderness.  Lymphadenopathy:     Cervical: No cervical adenopathy.  Skin:    Findings: No erythema or rash.  Neurological:     Cranial Nerves: No cranial nerve deficit.     Motor: No abnormal muscle tone.     Coordination: Coordination normal.     Deep Tendon Reflexes: Reflexes normal.  Psychiatric:        Behavior: Behavior normal.        Thought Content: Thought content normal.        Judgment: Judgment normal.     Lab Results  Component Value Date   WBC 6.0 02/16/2018   HGB 13.2 02/16/2018   HCT 39.9 02/16/2018   PLT 238 02/16/2018   GLUCOSE 97 02/16/2018   CHOL 163 07/31/2015   TRIG 70.0 07/31/2015   HDL 63.30 07/31/2015   LDLDIRECT 178.9 03/23/2013   LDLCALC 85 07/31/2015   ALT 13 02/16/2018   AST 16 02/16/2018   NA 139 02/16/2018   K 3.9 02/16/2018   CL 103 02/16/2018   CREATININE 0.72 02/16/2018   BUN 14 02/16/2018   CO2 28 02/16/2018   TSH 1.37 10/13/2017    Dg Chest 2 View  Result Date: 08/23/2017 CLINICAL DATA:  Chronic cough EXAM: CHEST - 2 VIEW COMPARISON:  01/29/2016 chest radiograph. FINDINGS: Stable cardiomediastinal silhouette with normal heart size. No pneumothorax. No pleural effusion. Lungs appear clear, with no acute consolidative airspace disease and no pulmonary edema. IMPRESSION: No active cardiopulmonary disease. Electronically Signed   By: Ilona Sorrel M.D.   On: 08/23/2017 23:20    Assessment & Plan:   There are no diagnoses linked to this encounter.   No orders of the defined types were placed in this encounter.    Follow-up: No follow-ups on file.  Walker Kehr, MD

## 2018-04-11 NOTE — Assessment & Plan Note (Signed)
Colonoscopy 07/2016 -- Dr Carlean Purl

## 2018-04-11 NOTE — Assessment & Plan Note (Addendum)
Flagyl po Pelvic US Florastor RTC 4 wks Colon 07/2016

## 2018-04-13 NOTE — Addendum Note (Signed)
Addended by: Karren Cobble on: 04/13/2018 11:34 AM   Modules accepted: Orders

## 2018-04-14 ENCOUNTER — Encounter: Payer: Self-pay | Admitting: Cardiology

## 2018-04-18 ENCOUNTER — Other Ambulatory Visit: Payer: Self-pay

## 2018-04-18 ENCOUNTER — Emergency Department (HOSPITAL_COMMUNITY)
Admission: EM | Admit: 2018-04-18 | Discharge: 2018-04-18 | Disposition: A | Discharge status: Home / Self Care | Payer: PPO | Attending: Emergency Medicine | Admitting: Emergency Medicine

## 2018-04-18 ENCOUNTER — Encounter (HOSPITAL_COMMUNITY): Payer: Self-pay | Admitting: *Deleted

## 2018-04-18 DIAGNOSIS — Z923 Personal history of irradiation: Secondary | ICD-10-CM | POA: Diagnosis not present

## 2018-04-18 DIAGNOSIS — Z853 Personal history of malignant neoplasm of breast: Secondary | ICD-10-CM | POA: Diagnosis not present

## 2018-04-18 DIAGNOSIS — Z9221 Personal history of antineoplastic chemotherapy: Secondary | ICD-10-CM | POA: Diagnosis not present

## 2018-04-18 DIAGNOSIS — I480 Paroxysmal atrial fibrillation: Secondary | ICD-10-CM | POA: Insufficient documentation

## 2018-04-18 DIAGNOSIS — Z87891 Personal history of nicotine dependence: Secondary | ICD-10-CM | POA: Insufficient documentation

## 2018-04-18 DIAGNOSIS — Z96651 Presence of right artificial knee joint: Secondary | ICD-10-CM | POA: Diagnosis not present

## 2018-04-18 DIAGNOSIS — Z7982 Long term (current) use of aspirin: Secondary | ICD-10-CM | POA: Insufficient documentation

## 2018-04-18 DIAGNOSIS — Z7901 Long term (current) use of anticoagulants: Secondary | ICD-10-CM | POA: Diagnosis not present

## 2018-04-18 DIAGNOSIS — Z79899 Other long term (current) drug therapy: Secondary | ICD-10-CM | POA: Diagnosis not present

## 2018-04-18 DIAGNOSIS — R000 Tachycardia, unspecified: Secondary | ICD-10-CM | POA: Diagnosis present

## 2018-04-18 HISTORY — DX: Unspecified atrial fibrillation: I48.91

## 2018-04-18 LAB — BASIC METABOLIC PANEL
Anion gap: 9 (ref 5–15)
BUN: 13 mg/dL (ref 8–23)
CO2: 23 mmol/L (ref 22–32)
Calcium: 9 mg/dL (ref 8.9–10.3)
Chloride: 104 mmol/L (ref 98–111)
Creatinine, Ser: 0.88 mg/dL (ref 0.44–1.00)
GFR calc Af Amer: >60 mL/min (ref >60)
GFR calc non Af Amer: >60 mL/min (ref >60)
Glucose, Bld: 148 mg/dL — ABNORMAL HIGH (ref 70–99)
Potassium: 3.4 mmol/L — ABNORMAL LOW (ref 3.5–5.1)
Sodium: 136 mmol/L (ref 135–145)

## 2018-04-18 LAB — CBC
HCT: 43.5 % (ref 36.0–46.0)
Hemoglobin: 14.1 g/dL (ref 12.0–15.0)
MCH: 30.4 pg (ref 26.0–34.0)
MCHC: 32.4 g/dL (ref 30.0–36.0)
MCV: 93.8 fL (ref 80.0–100.0)
Platelets: 278 10*3/uL (ref 150–400)
RBC: 4.64 MIL/uL (ref 3.87–5.11)
RDW: 12.6 % (ref 11.5–15.5)
WBC: 6.1 10*3/uL (ref 4.0–10.5)
nRBC: 0 % (ref 0.0–0.2)

## 2018-04-18 LAB — PROTIME-INR
INR: 1.03
Prothrombin Time: 13.4 seconds (ref 11.4–15.2)

## 2018-04-18 LAB — TROPONIN I: Troponin I: <0.03 ng/mL (ref <0.03)

## 2018-04-18 MED ORDER — MIDAZOLAM HCL 2 MG/2ML IJ SOLN
2.0000 mg | Freq: Once | INTRAMUSCULAR | Status: DC
  Filled 2018-04-18: qty 2

## 2018-04-18 MED ORDER — FENTANYL CITRATE (PF) 100 MCG/2ML IJ SOLN
50.0000 ug | Freq: Once | INTRAMUSCULAR | Status: AC
  Administered 2018-04-18: 50 ug via INTRAVENOUS
  Filled 2018-04-18: qty 2

## 2018-04-18 MED ORDER — FLECAINIDE ACETATE 50 MG PO TABS
50.0000 mg | ORAL_TABLET | Freq: Once | ORAL | Status: DC
  Filled 2018-04-18: qty 1

## 2018-04-18 MED ORDER — METOPROLOL TARTRATE 50 MG PO TABS: 50.0000 mg | ORAL_TABLET | ORAL | 0 refills | Status: DC | PRN

## 2018-04-18 MED ORDER — SODIUM CHLORIDE 0.9% FLUSH: 3.0000 mL | Freq: Once | INTRAVENOUS | Status: DC

## 2018-04-18 MED ORDER — DILTIAZEM HCL 60 MG PO TABS
60.0000 mg | ORAL_TABLET | Freq: Once | ORAL | Status: AC
  Administered 2018-04-18: 60 mg via ORAL
  Filled 2018-04-18: qty 1

## 2018-04-18 MED ORDER — METOPROLOL TARTRATE 5 MG/5ML IV SOLN
2.5000 mg | Freq: Once | INTRAVENOUS | Status: AC
  Administered 2018-04-18: 2.5 mg via INTRAVENOUS
  Filled 2018-04-18: qty 5

## 2018-04-18 MED ORDER — PROPOFOL 10 MG/ML IV BOLUS
0.5000 mg/kg | Freq: Once | INTRAVENOUS | Status: DC
  Filled 2018-04-18: qty 20

## 2018-04-18 NOTE — ED Provider Notes (Signed)
[facility] EMERGENCY DEPARTMENT Provider Note   CSN: [id number] Arrival date & time: 04/18/18  1938    History   Chief Complaint Chief Complaint  Patient presents with  . Tachycardia    HPI Brittany [name] is a 73 y.o. female.     HPI  The patient is a 73 year old female, she has a known history of breast cancer status post chemotherapy radiation and surgery.  The patient has a history of paroxysmal atrial fibrillation which was picked up on some monitor testing.  The patient states that this was very short-lived and initially she was started on Xarelto however she was then discontinued on this after review by the cardiology service.  She now takes a baby aspirin.  She reports that after dinner tonight she felt acute onset of palpitations in her chest, she checked her watch which she uses and the smart watch told her that she had a tachycardia.  She felt like it was very irregular.  She has noted several episodes of intermittent irregular tachycardia over the last month but none of these correlated necessarily with palpitations.  She did have an echocardiogram performed in October which showed normal ejection fraction and no signs of abnormalities.  Past Medical History:  Diagnosis Date  . A-fib ([place])   . Acid reflux   . Anemia   . Arthritis   . Breast cancer ([place]) 11/23/2007   right, ER/PR -, HER 2 -  . Cancer [facility])    breast right  triple recet neg, lumpect, chemo, XRT  . Esophageal stricture   . Gastritis 10/2007  . History of chemotherapy    neoadjuvant chemo, last dose 03/2008  . History of radiation therapy 07/25/08 -09/11/08   right breast  . Hx of colonic polyps 08/11/2016  . Hyperlipidemia   . Migraines    rare  . Osteopenia     Patient Active Problem List   Diagnosis Date Noted  . Bloating 04/11/2018  . Irregular heart beat 10/13/2017  . Abnormal EKG 08/23/2017  . Other specified injury of right quadriceps muscle, fascia and tendon, initial  encounter 09/14/2016  . Malignant neoplasm of overlapping sites of right breast in female, estrogen receptor positive ([place]) 08/17/2016  . Hx of colonic polyps 08/11/2016  . Iron deficiency anemia 07/28/2016  . Blood donor 07/28/2016  . Degenerative arthritis of left knee 04/06/2016  . Pain due to total right knee replacement ([place]) 04/06/2016  . Baker cyst, left 04/06/2016  . 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 Baker's cyst, unruptured 09/27/2015  . Degenerative arthritis of right knee 09/27/2015  . Right knee pain 07/31/2015  . Insomnia 07/31/2015  . Onychomycosis 09/25/2014  . Cognitive deficits 09/25/2014  . Urinary frequency 05/21/2014  . Mitral valve prolapse 05/08/2014  . Mitral regurgitation 05/08/2014  . Burning with urination 03/06/2014  . Acute cystitis with positive culture 03/06/2014  . Chest pain 02/09/2014  . Dyslipidemia 02/09/2014  . Myalgia 07/18/2013  . History of radiation therapy   . Hiatal hernia   . History of chemotherapy   . Osteoporosis 03/21/2012  . Seasonal and perennial allergic rhinitis 12/17/2011  . Cerumen impaction 12/01/2011  . Well adult exam 11/11/2010  . B12 deficiency 10/09/2009  . TOBACCO USE, QUIT 01/31/2009  . THYROID NODULE 12/25/2008  . ESOPHAGEAL STRICTURE 11/23/2007  . GERD 11/23/2007  . VARICOSE VEIN 12/29/2006  . Anxiety state 12/23/2006  . Depression 12/23/2006  . 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 Disorder of bone  and cartilage 12/23/2006    Past Surgical History:  Procedure Laterality Date  . BREAST LUMPECTOMY  06/26/2008   right, CA   XRT, chemo  . COLONOSCOPY    . EYE SURGERY     left eye cataract surgery 12/23/2015  . growth removed per right thigh    . JOINT REPLACEMENT  12/31/2015   RTK  . QUADRICEPS TENDON REPAIR Right 09/14/2016   Procedure: OPEN REPAIR QUADRICEP TENDON;  Surgeon: [name], MD;  Location: [facility] ORS;  Service: Orthopedics;  Laterality: Right;  . TOTAL KNEE ARTHROPLASTY Right  12/31/2015   Procedure: RIGHT TOTAL KNEE ARTHROPLASTY;  Surgeon: [name], MD;  Location: [facility] ORS;  Service: Orthopedics;  Laterality: Right;  . UPPER GASTROINTESTINAL ENDOSCOPY       OB History    Gravida  3   Para  3   Term      Preterm      AB      Living  3     SAB      TAB      Ectopic      Multiple      Live Births               Home Medications    Prior to Admission medications   Medication Sig Start Date End Date Taking? Authorizing Provider  Ascorbic Acid (VITAMIN C PO) Take 1 tablet by mouth daily.   Yes [provider]  aspirin EC 81 MG tablet Take 81 mg by mouth daily.   Yes [provider]  Biotin 1000 MCG tablet Take 1,000 mcg by mouth daily.   Yes [provider]  Cholecalciferol (VITAMIN D3) 1000 units CAPS Take 1,000 Units by mouth daily.   Yes [provider]  citalopram (CELEXA) 40 MG tablet Take 1 tablet (40 mg total) by mouth daily. 08/23/17  Yes Plotnikov, Evie Lacks, MD  denosumab (PROLIA) 60 MG/ML SOLN injection Inject 60 mg into the skin once. Administer in upper arm, thigh, or abdomen Patient taking differently: Inject 60 mg into the skin every 6 (six) months. Administer in upper arm, thigh, or abdomen 08/14/15  Yes Sylvan Cheese, NP  famotidine (PEPCID) 40 MG tablet Take 1 tablet (40 mg total) by mouth daily. 11/19/17  Yes Plotnikov, Evie Lacks, MD  levocetirizine (XYZAL) 5 MG tablet Take 5 mg by mouth daily.    Yes [provider]  metroNIDAZOLE (FLAGYL) 500 MG tablet Take 1 tablet (500 mg total) by mouth 3 (three) times daily. Patient taking differently: Take 500 mg by mouth 3 (three) times daily. FOR 10 DAYS 04/11/18  Yes Plotnikov, Evie Lacks, MD  Omega-3 Fatty Acids (FISH OIL) 1200 MG CAPS Take 1,200 mg by mouth daily.    Yes [provider]  saccharomyces boulardii (FLORASTOR) 250 MG capsule Take 1 capsule (250 mg total) by mouth 2 (two) times daily. 04/11/18  Yes Plotnikov,  Evie Lacks, MD  SUMAtriptan (IMITREX) 100 MG tablet Take 1 tablet (100 mg total) by mouth once for 1 dose. May repeat in 2 hours if headache persists or recurs. Patient taking differently: Take 100 mg by mouth once as needed for migraine or headache (and may repeat in 2 hours if headache persists or recurs).  08/23/17 04/18/18 Yes Plotnikov, Evie Lacks, MD  VALERIAN ROOT PO Take 1 capsule by mouth at bedtime.   Yes [provider]  vitamin B-12 (CYANOCOBALAMIN) 1000 MCG tablet Take 1,000 mcg by mouth daily.   Yes  [provider]  zolpidem (AMBIEN) 10 MG tablet Take 0.5-1 tablets (5-10 mg total) by mouth at bedtime as needed. for sleep Patient taking differently: Take 5-10 mg by mouth at bedtime as needed for sleep.  08/23/17  Yes Plotnikov, Evie Lacks, MD  apixaban (ELIQUIS) 5 MG TABS tablet Take 1 tablet (5 mg total) by mouth 2 (two) times daily. 01/31/18   [name], MD  metoprolol tartrate (LOPRESSOR) 50 MG tablet Take 1 tablet (50 mg total) by mouth as needed (raacing heart rate). 04/18/18   Noemi Chapel, MD    Family History Family History  Problem Relation Age of Onset  . Heart disease Father   . Cancer Father        lung  . Cancer Sister 36       Breast & Colorectal  . Epilepsy Mother   . Dementia Mother     Social History Social History   Tobacco Use  . Smoking status: Former Smoker    Packs/day: 0.25    Years: 5.00    Pack years: 1.25    Types: Cigarettes    Last attempt to quit: 03/03/1967    Years since quitting: 51.1  . Smokeless tobacco: Never Used  . Tobacco comment: 6 cig per day when she was smoking/quit years ago  Substance Use Topics  . Alcohol use: Yes    Alcohol/week: 7.0 standard drinks    Types: 7 Cans of beer per week    Comment: 1 beer per day   . Drug use: No     Allergies   Boniva [ibandronic acid] and Lipitor [atorvastatin]   Review of Systems Review of Systems   Physical Exam Updated Vital Signs BP (!) 137/98   Pulse  (!) 175   Temp 98 F (36.7 C)   Resp 18   LMP 03/02/1994 (Approximate)   SpO2 98%   Physical Exam   ED Treatments / Results  Labs (all labs ordered are listed, but only abnormal results are displayed) Labs Reviewed  BASIC METABOLIC PANEL - Abnormal; Notable for the following components:      Result Value   Potassium 3.4 (*)    Glucose, Bld 148 (*)    All other components within normal limits  CBC  TROPONIN I  PROTIME-INR    EKG EKG Interpretation  Date/Time:  Monday April 18 2018 20:55:47 EST Ventricular Rate:  113 PR Interval:    QRS Duration: 89 QT Interval:  319 QTC Calculation: 438 R Axis:   77 Text Interpretation:  Sinus tachycardia with irregular rate Repol abnrm suggests ischemia, anterolateral Since last tracing afib has resolved Confirmed by Noemi Chapel [phone]) on 04/18/2018 9:02:45 PM   EKG Interpretation  Date/Time:  Monday April 18 2018 22:13:14 EST Ventricular Rate:  72 PR Interval:    QRS Duration: 97 QT Interval:  423 QTC Calculation: 463 R Axis:   74 Text Interpretation:  Sinus rhythm Borderline repolarization abnormality Since last tracing Normal sinus rhythm has returned Confirmed by Noemi Chapel [phone]) on 04/18/2018 10:32:09 PM        Radiology No results found.  Procedures Procedures (including critical care time)  Medications Ordered in ED Medications  sodium chloride flush (NS) 0.9 % injection 3 mL (has no administration in time range)  propofol (DIPRIVAN) 10 mg/mL bolus/IV push 28.6 mg (has no administration in time range)  midazolam (VERSED) injection 2 mg (has no administration in time range)  fentaNYL (SUBLIMAZE) injection 50 mcg (50 mcg Intravenous Given 04/18/18  2052)  metoprolol tartrate (LOPRESSOR) injection 2.5 mg (2.5 mg Intravenous Given 04/18/18 2111)  diltiazem (CARDIZEM) tablet 60 mg (60 mg Oral Given 04/18/18 2129)     Initial Impression / Assessment and Plan / ED Course  I have reviewed the triage vital  signs and the nursing notes.  Pertinent labs & imaging results that were available during my care of the patient were reviewed by me and considered in my medical decision making (see chart for details).  Clinical Course as of Apr 18 2229  Mon Apr 18, 2018  2101 Electrolytes are normal, blood counts troponin and coagulation studies are also normal.  The patient after getting a small dose of fentanyl prior to cardioversion went back into what appears to be a sinus rhythm with frequent ectopy.  We will not proceed with cardioversion at this time but rather she will be given a small dose of flecainide as well as a small dose of metoprolol.  The patient's blood pressure is around 818 systolic and her mentation is totally normal.   [BM]    Clinical Course User Index [BM] Noemi Chapel, MD       After dose of metoprolol and Cardizem the patient has converted to a normal sinus rhythm.  I discussed this case with the cardiology service, they have recommended that the patient can follow-up at her appointment in March.  Will not do any anticoagulation at this time, she will be given a prescription for low-dose metoprolol to use as a pill in the pocket type treatment plan.  Patient agreeable  Final Clinical Impressions(s) / ED Diagnoses   Final diagnoses:  Paroxysmal atrial fibrillation [facility])    ED Discharge Orders         Ordered    metoprolol tartrate (LOPRESSOR) 50 MG tablet  As needed     04/18/18 2229           Noemi Chapel, MD 04/18/18 2233

## 2018-04-18 NOTE — Discharge Instructions (Signed)
Please follow-up with Dr. Marlou Porch at your appointment in March, until that time please take 50 mg of metoprolol only if you feel like your heart is racing.  Otherwise you do not need to take this medication nor do need to take a blood thinner.  Dr. Marlou Porch can have a further discussion with you at that time about what to do going forward.  Please return to the emergency department for severe or worsening chest pain shortness of breath or racing heartbeat that does not resolve quickly.

## 2018-04-18 NOTE — ED Triage Notes (Signed)
Pt c/o tachycardia while eating dinner. Reports checking her apple watch and showing her heart rate was very high. Recent diagnosis of afib, is taking asa

## 2018-05-02 ENCOUNTER — Encounter: Payer: Self-pay | Admitting: Cardiology

## 2018-05-02 ENCOUNTER — Encounter: Payer: Self-pay | Admitting: *Deleted

## 2018-05-02 ENCOUNTER — Ambulatory Visit: Payer: PPO | Admitting: Cardiology

## 2018-05-02 VITALS — BP 112/60 | HR 73 | Ht 67.0 in | Wt 126.4 lb

## 2018-05-02 DIAGNOSIS — G4733 Obstructive sleep apnea (adult) (pediatric): Secondary | ICD-10-CM

## 2018-05-02 DIAGNOSIS — R0683 Snoring: Secondary | ICD-10-CM | POA: Diagnosis not present

## 2018-05-02 DIAGNOSIS — I480 Paroxysmal atrial fibrillation: Secondary | ICD-10-CM | POA: Diagnosis not present

## 2018-05-02 DIAGNOSIS — R002 Palpitations: Secondary | ICD-10-CM

## 2018-05-02 DIAGNOSIS — S8010XA Contusion of unspecified lower leg, initial encounter: Secondary | ICD-10-CM | POA: Insufficient documentation

## 2018-05-02 NOTE — Patient Instructions (Signed)
Medication Instructions:  The current medical regimen is effective;  continue present plan and medications.  If you need a refill on your cardiac medications before your next appointment, please call your pharmacy.   You have been referred to either Dr Rayann Heman or Dr Curt Bears to discuss possible At Fib ablation.  Follow-Up: At [facility], you and your health needs are our priority.  As part of our continuing mission to provide you with exceptional heart care, we have created designated Provider Care Teams.  These Care Teams include your primary Cardiologist (physician) and Advanced Practice Providers (APPs -  Physician Assistants and Nurse Practitioners) who all work together to provide you with the care you need, when you need it. You will need a follow up appointment in 6 months.  Please call our office 2 months in advance to schedule this appointment.  You may see Candee Furbish, MD or one of the following Advanced Practice Providers on your designated Care Team:   Truitt Merle, NP Cecilie Kicks, NP . Kathyrn Drown, NP  Thank you for choosing [facility]!!     Cardiac Ablation Cardiac ablation is a procedure to disable (ablate) a small amount of heart tissue in very specific places. The heart has many electrical connections. Sometimes these connections are abnormal and can cause the heart to beat very fast or irregularly. Ablating some of the problem areas can improve the heart rhythm or return it to normal. Ablation may be done for people who:  Have Wolff-Parkinson-White syndrome.  Have fast heart rhythms (tachycardia).  Have taken medicines for an abnormal heart rhythm (arrhythmia) that were not effective or caused side effects.  Have a high-risk heartbeat that may be life-threatening. During the procedure, a small incision is made in the neck or the groin, and a long, thin, flexible tube (catheter) is inserted into the incision and moved to the heart. Small devices  (electrodes) on the tip of the catheter will send out electrical currents. A type of X-ray (fluoroscopy) will be used to help guide the catheter and to provide images of the heart. Tell a health care provider about:  Any allergies you have.  All medicines you are taking, including vitamins, herbs, eye drops, creams, and over-the-counter medicines.  Any problems you or family members have had with anesthetic medicines.  Any blood disorders you have.  Any surgeries you have had.  Any medical conditions you have, such as kidney failure.  Whether you are pregnant or may be pregnant. What are the risks? Generally, this is a safe procedure. However, problems may occur, including:  Infection.  Bruising and bleeding at the catheter insertion site.  Bleeding into the chest, especially into the sac that surrounds the heart. This is a serious complication.  Stroke or blood clots.  Damage to other structures or organs.  Allergic reaction to medicines or dyes.  Need for a permanent pacemaker if the normal electrical system is damaged. A pacemaker is a small computer that sends electrical signals to the heart and helps your heart beat normally.  The procedure not being fully effective. This may not be recognized until months later. Repeat ablation procedures are sometimes required. What happens before the procedure?  Follow instructions from your health care provider about eating or drinking restrictions.  Ask your health care provider about: ? Changing or stopping your regular medicines. This is especially important if you are taking diabetes medicines or blood thinners. ? Taking medicines such as aspirin and ibuprofen. These medicines can thin  your blood. Do not take these medicines before your procedure if your health care provider instructs you not to.  Plan to have someone take you home from the hospital or clinic.  If you will be going home right after the procedure, plan to have  someone with you for 24 hours. What happens during the procedure?  To lower your risk of infection: ? Your health care team will wash or sanitize their hands. ? Your skin will be washed with soap. ? Hair may be removed from the incision area.  An IV tube will be inserted into one of your veins.  You will be given a medicine to help you relax (sedative).  The skin on your neck or groin will be numbed.  An incision will be made in your neck or your groin.  A needle will be inserted through the incision and into a large vein in your neck or groin.  A catheter will be inserted into the needle and moved to your heart.  Dye may be injected through the catheter to help your surgeon see the area of the heart that needs treatment.  Electrical currents will be sent from the catheter to ablate heart tissue in desired areas. There are three types of energy that may be used to ablate heart tissue: ? Heat (radiofrequency energy). ? Laser energy. ? Extreme cold (cryoablation).  When the necessary tissue has been ablated, the catheter will be removed.  Pressure will be held on the catheter insertion area to prevent excessive bleeding.  A bandage (dressing) will be placed over the catheter insertion area. The procedure may vary among health care providers and hospitals. What happens after the procedure?  Your blood pressure, heart rate, breathing rate, and blood oxygen level will be monitored until the medicines you were given have worn off.  Your catheter insertion area will be monitored for bleeding. You will need to lie still for a few hours to ensure that you do not bleed from the catheter insertion area.  Do not drive for 24 hours or as long as directed by your health care provider. Summary  Cardiac ablation is a procedure to disable (ablate) a small amount of heart tissue in very specific places. Ablating some of the problem areas can improve the heart rhythm or return it to  normal.  During the procedure, electrical currents will be sent from the catheter to ablate heart tissue in desired areas. This information is not intended to replace advice given to you by your health care provider. Make sure you discuss any questions you have with your health care provider. Document Released: 07/05/2008 Document Revised: 01/06/2016 Document Reviewed: 01/06/2016 Elsevier Interactive Patient Education  2019 [employer].

## 2018-05-02 NOTE — Progress Notes (Signed)
Cardiology Office Note:    Date:  05/02/2018   ID:  Brittany [name], DOB 01/24/1946, MRN [id number]  PCP:  Cassandria Anger, MD  Cardiologist:  Candee Furbish, MD  Electrophysiologist:  None   Referring MD: Cassandria Anger, MD     History of Present Illness:    Brittany [name] is a 73 y.o. female here for follow-up of atrial fibrillation discovered on event monitor.  She had a brief episode of approximately 2 minutes duration but appears to be irregularly irregular with wandering baseline compatible with atrial fibrillation.  For several years she has felt irregular heartbeats intermittently.  At one point she was at her oncologist and was told that she had an irregular heartbeat at that time.  Back in 2015 she did have a treadmill stress test that showed brief periods of SVT done in [facility] which were asymptomatic.  [name]. Had a great time.   Overall once again she does not feel her palpitations.  She is without shortness of breath.  She climb 420 stairs.  No chest pain.  05/02/2018 -went to the ER on 04/18/2018 with rapid atrial fibrillation, 175, AFIB.  After dinner, she started to feel her heart jumping out of her chest, racing, pounding in her smart watch said that she had a tachycardia.  It was very irregular.  She is had a few of these episodes up to now.  No fevers chills nausea vomiting syncope bleeding. LDL 85, creatinine 0.8  Past Medical History:  Diagnosis Date  . A-fib ([place])   . Acid reflux   . Anemia   . Arthritis   . Breast cancer ([place]) 11/23/2007   right, ER/PR -, HER 2 -  . [facility])    breast right  triple recet neg, lumpect, chemo, XRT  . Esophageal stricture   . Gastritis 10/2007  . History of chemotherapy    neoadjuvant chemo, last dose 03/2008  . History of radiation therapy 07/25/08 -09/11/08   right breast  . Hx of colonic polyps 08/11/2016  . Hyperlipidemia   . Migraines    rare  . Osteopenia     Past Surgical History:  Procedure  Laterality Date  . BREAST LUMPECTOMY  06/26/2008   right, CA   XRT, chemo  . COLONOSCOPY    . EYE SURGERY     left eye cataract surgery 12/23/2015  . growth removed per right thigh    . JOINT REPLACEMENT  12/31/2015   RTK  . QUADRICEPS TENDON REPAIR Right 09/14/2016   Procedure: OPEN REPAIR QUADRICEP TENDON;  Surgeon: [name], MD;  Location: [facility] ORS;  Service: Orthopedics;  Laterality: Right;  . TOTAL KNEE ARTHROPLASTY Right 12/31/2015   Procedure: RIGHT TOTAL KNEE ARTHROPLASTY;  Surgeon: [name], MD;  Location: [facility] ORS;  Service: Orthopedics;  Laterality: Right;  . UPPER GASTROINTESTINAL ENDOSCOPY      Current Medications: Current Meds  Medication Sig  . Ascorbic Acid (VITAMIN C PO) Take 1 tablet by mouth daily.  [name] aspirin EC 81 MG tablet Take 81 mg by mouth daily.  . Biotin 1000 MCG tablet Take 1,000 mcg by mouth daily.  . Cholecalciferol (VITAMIN D3) 1000 units CAPS Take 1,000 Units by mouth daily.  . citalopram (CELEXA) 40 MG tablet Take 1 tablet (40 mg total) by mouth daily.  [name] denosumab (PROLIA) 60 MG/ML SOLN injection Inject 60 mg into the skin once. Administer in upper arm, thigh, or abdomen (Patient taking differently: Inject 60 mg  into the skin once. Administer in upper arm, thigh, or abdomen every 6 months.)  . famotidine (PEPCID) 40 MG tablet Take 1 tablet (40 mg total) by mouth daily.  [name] levocetirizine (XYZAL) 5 MG tablet Take 5 mg by mouth daily.   . metoprolol tartrate (LOPRESSOR) 50 MG tablet Take 1 tablet (50 mg total) by mouth as needed (raacing heart rate).  . Omega-3 Fatty Acids (FISH OIL) 1200 MG CAPS Take 1,200 mg by mouth daily.   [name] saccharomyces boulardii (FLORASTOR) 250 MG capsule Take 1 capsule (250 mg total) by mouth 2 (two) times daily.  [name] VALERIAN ROOT PO Take 1 capsule by mouth at bedtime.  . vitamin B-12 (CYANOCOBALAMIN) 1000 MCG tablet Take 1,000 mcg by mouth daily.  [name] zolpidem (AMBIEN) 10 MG tablet Take 0.5-1 tablets (5-10 mg total) by mouth at  bedtime as needed. for sleep     Allergies:   Boniva [ibandronic acid] and Lipitor [atorvastatin]   Social History   Socioeconomic History  . Marital status: Married    Spouse name: Not on file  . Number of children: 3  . Years of education: Not on file  . Highest education level: Not on file  Occupational History    Employer: RETIRED  Social Needs  . Financial resource strain: Not hard at all  . Food insecurity:    Worry: Never true    Inability: Never true  . Transportation needs:    Medical: No    Non-medical: No  Tobacco Use  . Smoking status: Former Smoker    Packs/day: 0.25    Years: 5.00    Pack years: 1.25    Types: Cigarettes    Last attempt to quit: 03/03/1967    Years since quitting: 51.2  . Smokeless tobacco: Never Used  . Tobacco comment: 6 cig per day when she was smoking/quit years ago  Substance and Sexual Activity  . Alcohol use: Yes    Alcohol/week: 7.0 standard drinks    Types: 7 Cans of beer per week    Comment: 1 beer per day   . Drug use: No  . Sexual activity: Yes    Partners: Male    Birth control/protection: Post-menopausal  Lifestyle  . Physical activity:    Days per week: 3 days    Minutes per session: 60 min  . Stress: Not at all  Relationships  . Social connections:    Talks on phone: More than three times a week    Gets together: More than three times a week    Attends religious service: 1 to 4 times per year    Active member of club or organization: Yes    Attends meetings of clubs or organizations: More than 4 times per year    Relationship status: Married  Other Topics Concern  . Not on file  Social History Narrative   G3, P3, 1st pregnancy age 15, menopause age 7, no HRT     Family History: The patient's family history includes Cancer in her father; Cancer (age of onset: 13) in her sister; Dementia in her mother; Epilepsy in her mother; Heart disease in her father.  ROS:   Please see the history of present illness.      All other systems reviewed and are negative.  EKGs/Labs/Other Studies Reviewed:    The following studies were reviewed today:  Long-term monitor on 12/29/2017:  Brief episode of paroxysmal atrial fibrillation noted with maximum heart rate 163 bpm. Episode lasted approximately 2 minutes.  Occasional ventricular couplets. Occasional PVCs  Slow ventricular run of approximately 79 bpm, 7 beats. Asymptomatic.  Brief 10 beats of paroxysmal atrial tachycardia  Paroxysmal atrial fibrillation episode noted-with her age greater than 19 and female, chads vas score is 2. Please have her follow-up in clinic to discuss anticoagulation.  ECHO 12/29/17:  - Left ventricle: The cavity size was normal. Neuhaus thickness was   normal. Systolic function was normal. The estimated ejection   fraction was in the range of 55% to 60%. Left ventricular   diastolic function parameters were normal. - Aortic valve: Sclerosis without stenosis. - Mitral valve: Leaflets are severely thickened and calcified with   posterior leaflet prolapse but only trivial MR. - Atrial septum: A patent foramen ovale cannot be excluded.  EKG:  EKG is not ordered today.    Recent Labs: 10/13/2017: Magnesium 1.9; TSH 1.37 02/16/2018: ALT 13 04/18/2018: BUN 13; Creatinine, Ser 0.88; Hemoglobin 14.1; Platelets 278; Potassium 3.4; Sodium 136  Recent Lipid Panel    Component Value Date/Time   CHOL 163 07/31/2015 1153   TRIG 70.0 07/31/2015 1153   HDL 63.30 07/31/2015 1153   CHOLHDL 3 07/31/2015 1153   VLDL 14.0 07/31/2015 1153   LDLCALC 85 07/31/2015 1153   LDLDIRECT 178.9 03/23/2013 1059    Physical Exam:    VS:  BP 112/60   Pulse 73   Ht 5\' 7"  (1.702 m)   Wt 126 lb 6.4 oz (57.3 kg)   LMP 03/02/1994 (Approximate)   SpO2 99%   BMI 19.80 kg/m     Wt Readings from Last 3 Encounters:  05/02/18 126 lb 6.4 oz (57.3 kg)  04/11/18 126 lb (57.2 kg)  02/18/18 128 lb (58.1 kg)     VQM:GQQP, in no acute distress  HEENT:  normal  Neck: no JVD, carotid bruits, or masses Cardiac: RRR; no murmurs, rubs, or gallops,no edema  Respiratory:  clear to auscultation bilaterally, normal work of breathing GI: soft, nontender, nondistended, + BS MS: no deformity or atrophy  Skin: warm and dry, no rash Neuro:  Alert and Oriented x 3, Strength and sensation are intact Psych: euthymic mood, full affect   ASSESSMENT:    1. PAF (paroxysmal atrial fibrillation) ([place])   2. OSA (obstructive sleep apnea)   3. Snoring   4. Palpitations    PLAN:    In order of problems listed above:  Paroxysmal atrial fibrillation -She had another episode of atrial fibrillation that prompted her ER visit on 04/18/2018.  Multiple EKGs personally reviewed demonstrates heart rate as high as 175 bpm.  Her apple watch detected her heart rate as well.  I would like for her to sit down with either Dr. Rayann Heman or Dr. Curt Bears to discuss the potential for atrial fibrillation ablation given her symptomatic atrial fibrillation, rare instances and normal structural heart.  We may be able to rid her of future recurrences with ablative procedure. - Previously, approximately a 2-minute episode seen on event monitor.  Given her age greater than 26, female thus chads Vasc of 2 I do think that she warrants anticoagulation for stroke prevention. -Recent creatinine was 0.69, prior hemoglobin was 12.0-12.7. - Her EF is normal.  Her blood pressure is on the low side at baseline. -She snores quite a bit, her husband wears earplugs, we will check a sleep study with OSA, she is to go in next week.  Paroxysmal atrial tachycardia -She also had episodes of PAT as well.  Mitral valve prolapse - Thickened but only  trivial regurgitation.  Follow-up after EP visit  6 month follow up  Medication Adjustments/Labs and Tests Ordered: Current medicines are reviewed at length with the patient today.  Concerns regarding medicines are outlined above.  Orders Placed This  Encounter  Procedures  . Ambulatory referral to Cardiac Electrophysiology   No orders of the defined types were placed in this encounter.   Patient Instructions  Medication Instructions:  The current medical regimen is effective;  continue present plan and medications.  If you need a refill on your cardiac medications before your next appointment, please call your pharmacy.   You have been referred to either Dr Rayann Heman or Dr Curt Bears to discuss possible At Fib ablation.  Follow-Up: At [facility], you and your health needs are our priority.  As part of our continuing mission to provide you with exceptional heart care, we have created designated Provider Care Teams.  These Care Teams include your primary Cardiologist (physician) and Advanced Practice Providers (APPs -  Physician Assistants and Nurse Practitioners) who all work together to provide you with the care you need, when you need it. You will need a follow up appointment in 6 months.  Please call our office 2 months in advance to schedule this appointment.  You may see Candee Furbish, MD or one of the following Advanced Practice Providers on your designated Care Team:   Truitt Merle, NP Cecilie Kicks, NP . Kathyrn Drown, NP  Thank you for choosing [facility]!!     Cardiac Ablation Cardiac ablation is a procedure to disable (ablate) a small amount of heart tissue in very specific places. The heart has many electrical connections. Sometimes these connections are abnormal and can cause the heart to beat very fast or irregularly. Ablating some of the problem areas can improve the heart rhythm or return it to normal. Ablation may be done for people who:  Have Wolff-Parkinson-White syndrome.  Have fast heart rhythms (tachycardia).  Have taken medicines for an abnormal heart rhythm (arrhythmia) that were not effective or caused side effects.  Have a high-risk heartbeat that may be life-threatening. During the procedure,  a small incision is made in the neck or the groin, and a long, thin, flexible tube (catheter) is inserted into the incision and moved to the heart. Small devices (electrodes) on the tip of the catheter will send out electrical currents. A type of X-ray (fluoroscopy) will be used to help guide the catheter and to provide images of the heart. Tell a health care provider about:  Any allergies you have.  All medicines you are taking, including vitamins, herbs, eye drops, creams, and over-the-counter medicines.  Any problems you or family members have had with anesthetic medicines.  Any blood disorders you have.  Any surgeries you have had.  Any medical conditions you have, such as kidney failure.  Whether you are pregnant or may be pregnant. What are the risks? Generally, this is a safe procedure. However, problems may occur, including:  Infection.  Bruising and bleeding at the catheter insertion site.  Bleeding into the chest, especially into the sac that surrounds the heart. This is a serious complication.  Stroke or blood clots.  Damage to other structures or organs.  Allergic reaction to medicines or dyes.  Need for a permanent pacemaker if the normal electrical system is damaged. A pacemaker is a small computer that sends electrical signals to the heart and helps your heart beat normally.  The procedure not being fully effective. This may not  be recognized until months later. Repeat ablation procedures are sometimes required. What happens before the procedure?  Follow instructions from your health care provider about eating or drinking restrictions.  Ask your health care provider about: ? Changing or stopping your regular medicines. This is especially important if you are taking diabetes medicines or blood thinners. ? Taking medicines such as aspirin and ibuprofen. These medicines can thin your blood. Do not take these medicines before your procedure if your health care  provider instructs you not to.  Plan to have someone take you home from the hospital or clinic.  If you will be going home right after the procedure, plan to have someone with you for 24 hours. What happens during the procedure?  To lower your risk of infection: ? Your health care team will wash or sanitize their hands. ? Your skin will be washed with soap. ? Hair may be removed from the incision area.  An IV tube will be inserted into one of your veins.  You will be given a medicine to help you relax (sedative).  The skin on your neck or groin will be numbed.  An incision will be made in your neck or your groin.  A needle will be inserted through the incision and into a large vein in your neck or groin.  A catheter will be inserted into the needle and moved to your heart.  Dye may be injected through the catheter to help your surgeon see the area of the heart that needs treatment.  Electrical currents will be sent from the catheter to ablate heart tissue in desired areas. There are three types of energy that may be used to ablate heart tissue: ? Heat (radiofrequency energy). ? Laser energy. ? Extreme cold (cryoablation).  When the necessary tissue has been ablated, the catheter will be removed.  Pressure will be held on the catheter insertion area to prevent excessive bleeding.  A bandage (dressing) will be placed over the catheter insertion area. The procedure may vary among health care providers and hospitals. What happens after the procedure?  Your blood pressure, heart rate, breathing rate, and blood oxygen level will be monitored until the medicines you were given have worn off.  Your catheter insertion area will be monitored for bleeding. You will need to lie still for a few hours to ensure that you do not bleed from the catheter insertion area.  Do not drive for 24 hours or as long as directed by your health care provider. Summary  Cardiac ablation is a procedure  to disable (ablate) a small amount of heart tissue in very specific places. Ablating some of the problem areas can improve the heart rhythm or return it to normal.  During the procedure, electrical currents will be sent from the catheter to ablate heart tissue in desired areas. This information is not intended to replace advice given to you by your health care provider. Make sure you discuss any questions you have with your health care provider. Document Released: 07/05/2008 Document Revised: 01/06/2016 Document Reviewed: 01/06/2016 Elsevier Interactive Patient Education  2019 [employer].     Signed, Candee Furbish, MD  05/02/2018 2:21 PM    [place]

## 2018-05-04 ENCOUNTER — Ambulatory Visit (HOSPITAL_BASED_OUTPATIENT_CLINIC_OR_DEPARTMENT_OTHER): Payer: PPO | Attending: Cardiology | Admitting: Cardiology

## 2018-05-04 VITALS — Ht 67.0 in | Wt 127.0 lb

## 2018-05-04 DIAGNOSIS — G4733 Obstructive sleep apnea (adult) (pediatric): Secondary | ICD-10-CM | POA: Insufficient documentation

## 2018-05-05 ENCOUNTER — Ambulatory Visit
Admission: RE | Admit: 2018-05-05 | Discharge: 2018-05-05 | Disposition: A | Discharge status: Home / Self Care | Payer: PPO | Source: Ambulatory Visit | Attending: Internal Medicine | Admitting: Internal Medicine

## 2018-05-05 DIAGNOSIS — R109 Unspecified abdominal pain: Secondary | ICD-10-CM

## 2018-05-05 DIAGNOSIS — R140 Abdominal distension (gaseous): Secondary | ICD-10-CM | POA: Diagnosis not present

## 2018-05-08 NOTE — Procedures (Signed)
   Patient Name: Brittany, [name] Date: 05/04/2018 Gender: Female D.O.B: Dec 23, 1945 Age (years): 54 Referring Provider: [name] MD, ABSM Height (inches): 67 Interpreting Physician: [name] MD, ABSM Weight (lbs): 124 RPSGT: Jorge Ny BMI: 19 MRN: [id number] Neck Size: 14.00  CLINICAL INFORMATION  The patient is referred for a CPAP titration to treat sleep apnea.  SLEEP STUDY TECHNIQUE  As per the AASM Manual for the Scoring of Sleep and Associated Events v2.3 (April 2016) with a hypopnea requiring 4% desaturations. The channels recorded and monitored were frontal, central and occipital EEG, electrooculogram (EOG), submentalis EMG (chin), nasal and oral airflow, thoracic and abdominal Kovaleski motion, anterior tibialis EMG, snore microphone, electrocardiogram, and pulse oximetry. Continuous positive airway pressure (CPAP) was initiated at the beginning of the study and titrated to treat sleep-disordered breathing.  MEDICATIONS  Medications self-administered by patient taken the night of the study : [place]  Comments added by technician: NONE Comments added by scorer: N/A  RESPIRATORY PARAMETERS  NOptimal PAP Pressure (cm):12 AHI at Optimal Pressure (/hr):0 Overall Minimal O2 (%):86.0 Supine % at Optimal Pressure (%):N/A Minimal O2 at Optimal Pressure (%):91.0   SLEEP ARCHITECTURE  The study was initiated at 10:29:24 PM and ended at 5:19:47 AM. Sleep onset time was 8.7 minutes and the sleep efficiency was 83.3%%. The total sleep time was 342 minutes. The patient spent 3.5% of the night in stage N1 sleep, 71.8% in stage N2 sleep, 0.0% in stage N3 and 24.7% in REM.Stage REM latency was 171.5 minutes Wake after sleep onset was 59.7. Alpha intrusion was absent. Supine sleep was 29.82%.  CARDIAC DATA  The 2 lead EKG demonstrated sinus rhythm. The mean heart rate was 68.8 beats per minute. Other EKG findings include: None.  LEG MOVEMENT DATA  The  total Periodic Limb Movements of Sleep (PLMS) were 0. The PLMS index was 0.0. A PLMS index of <15 is considered normal in adults.   IMPRESSIONS  - An optimal PAP pressure of12cm H2O was obtained. - Central sleep apnea was not noted during this titration (CAI = 0.2/h). - Moderate oxygen desaturations were observed during this titration (min O2 = 86.0%). - The patient snored with soft snoring volume during this titration study. - No cardiac abnormalities were observed during this study. - Clinically significant periodic limb movements were not noted during this study. Arousals associated with PLMs were rare.  DIAGNOSIS  - Obstructive Sleep Apnea (327.23 [G47.33 ICD-10])  RECOMMENDATIONS  - Recommend CPAP at 12cm H2O with heated humidity and medium Airfit 30 full face mask. - Avoid alcohol, sedatives and other CNS depressants that may worsen sleep apnea and disrupt normal sleep architecture. - Sleep hygiene should be reviewed to assess factors that may improve sleep quality. - Weight management and regular exercise should be initiated or continued. - Recommend followup in sleep clinic in 10 weeks.  [Electronically signed] 05/08/2018 07:34 PM  [name] MD, ABSM Diplomate, American Board of Sleep Medicine

## 2018-05-09 ENCOUNTER — Telehealth: Payer: Self-pay | Admitting: *Deleted

## 2018-05-09 ENCOUNTER — Ambulatory Visit (INDEPENDENT_AMBULATORY_CARE_PROVIDER_SITE_OTHER): Payer: PPO | Admitting: Cardiology

## 2018-05-09 ENCOUNTER — Telehealth: Payer: Self-pay | Admitting: Cardiology

## 2018-05-09 ENCOUNTER — Institutional Professional Consult (permissible substitution): Payer: PPO | Admitting: Cardiology

## 2018-05-09 ENCOUNTER — Encounter: Payer: Self-pay | Admitting: Cardiology

## 2018-05-09 VITALS — BP 102/56 | HR 85 | Ht 67.0 in | Wt 128.0 lb

## 2018-05-09 DIAGNOSIS — I480 Paroxysmal atrial fibrillation: Secondary | ICD-10-CM | POA: Diagnosis not present

## 2018-05-09 MED ORDER — APIXABAN 5 MG PO TABS: 5.0000 mg | ORAL_TABLET | Freq: Two times a day (BID) | ORAL | 3 refills | Status: DC

## 2018-05-09 NOTE — Progress Notes (Signed)
Electrophysiology Office Note   Date:  05/09/2018   ID:  Brittany [name], DOB 26-Oct-1945, MRN [id number]  PCP:  Tresa Garter, MD  Cardiologist:  Anne Fu Primary Electrophysiologist:  Lacrystal Barbe Jorja Loa, MD    No chief complaint on file.    History of Present Illness: Brittany [name] is a 73 y.o. female who is being seen today for the evaluation of atrial fibrillation at the request of Jake Bathe, MD. Presenting today for electrophysiology evaluation.  She has a history of atrial fibrillation, breast cancer status post lumpectomy, chemo and XRT.  She wore a cardiac monitor that showed a brief episode of atrial fibrillation.  She has had palpitations for many years.  She did have a treadmill test that showed a brief episode of SVT for which she was asymptomatic.  She went to the emergency room 04/18/2018 with rapid atrial fibrillation rates 175.  She had palpitations with racing and pounding.  She does state that she has had an abnormal rhythm for multiple years, but only has just been diagnosed with atrial fibrillation.  Today, she denies symptoms of palpitations, chest pain, shortness of breath, orthopnea, PND, lower extremity edema, claudication, dizziness, presyncope, syncope, bleeding, or neurologic sequela. The patient is tolerating medications without difficulties.    Past Medical History:  Diagnosis Date  . A-fib (HCC)   . Acid reflux   . Anemia   . Arthritis   . Breast cancer (HCC) 11/23/2007   right, ER/PR -, HER 2 -  . Cancer [facility])    breast right  triple recet neg, lumpect, chemo, XRT  . Esophageal stricture   . Gastritis 10/2007  . History of chemotherapy    neoadjuvant chemo, last dose 03/2008  . History of radiation therapy 07/25/08 -09/11/08   right breast  . Hx of colonic polyps 08/11/2016  . Hyperlipidemia   . Migraines    rare  . Osteopenia    Past Surgical History:  Procedure Laterality Date  . BREAST LUMPECTOMY  06/26/2008   right, CA   XRT, chemo  .  COLONOSCOPY    . EYE SURGERY     left eye cataract surgery 12/23/2015  . growth removed per right thigh    . JOINT REPLACEMENT  12/31/2015   RTK  . QUADRICEPS TENDON REPAIR Right 09/14/2016   Procedure: OPEN REPAIR QUADRICEP TENDON;  Surgeon: Durene Romans, MD;  Location: [facility];  Service: Orthopedics;  Laterality: Right;  . TOTAL KNEE ARTHROPLASTY Right 12/31/2015   Procedure: RIGHT TOTAL KNEE ARTHROPLASTY;  Surgeon: Durene Romans, MD;  Location: [facility];  Service: Orthopedics;  Laterality: Right;  . UPPER GASTROINTESTINAL ENDOSCOPY       Current Outpatient Medications  Medication Sig Dispense Refill  . Ascorbic Acid (VITAMIN C PO) Take 1 tablet by mouth daily.    . Biotin 1000 MCG tablet Take 1,000 mcg by mouth daily.    . Cholecalciferol (VITAMIN D3) 1000 units CAPS Take 1,000 Units by mouth daily.    . citalopram (CELEXA) 40 MG tablet Take 1 tablet (40 mg total) by mouth daily. 90 tablet 3  . denosumab (PROLIA) 60 MG/ML SOLN injection Inject 60 mg into the skin once. Administer in upper arm, thigh, or abdomen (Patient taking differently: Inject 60 mg into the skin once. Administer in upper arm, thigh, or abdomen every 6 months.) 1.8 mL 1  . famotidine (PEPCID) 40 MG tablet Take 1 tablet (40 mg total) by mouth daily. 90 tablet 3  . levocetirizine (  XYZAL) 5 MG tablet Take 5 mg by mouth daily.     . metoprolol tartrate (LOPRESSOR) 50 MG tablet Take 1 tablet (50 mg total) by mouth as needed (raacing heart rate). 30 tablet 0  . Omega-3 Fatty Acids (FISH OIL) 1200 MG CAPS Take 1,200 mg by mouth daily.     [name] saccharomyces boulardii (FLORASTOR) 250 MG capsule Take 1 capsule (250 mg total) by mouth 2 (two) times daily. 60 capsule 1  . VALERIAN ROOT PO Take 1 capsule by mouth at bedtime.    . vitamin B-12 (CYANOCOBALAMIN) 1000 MCG tablet Take 1,000 mcg by mouth daily.    [name] zolpidem (AMBIEN) 10 MG tablet Take 0.5-1 tablets (5-10 mg total) by mouth at bedtime as needed. for sleep 90 tablet 0  .  apixaban (ELIQUIS) 5 MG TABS tablet Take 1 tablet (5 mg total) by mouth 2 (two) times daily. 60 tablet 3  . SUMAtriptan (IMITREX) 100 MG tablet Take 1 tablet (100 mg total) by mouth once for 1 dose. May repeat in 2 hours if headache persists or recurs. 9 tablet 5   No current facility-administered medications for this visit.     Allergies:   Boniva [ibandronic acid] and Lipitor [atorvastatin]   Social History:  The patient  reports that she quit smoking about 51 years ago. Her smoking use included cigarettes. She has a 1.25 pack-year smoking history. She has never used smokeless tobacco. She reports current alcohol use of about 7.0 standard drinks of alcohol per week. She reports that she does not use drugs.   Family History:  The patient's family history includes Cancer in her father; Cancer (age of onset: 19) in her sister; Dementia in her mother; Epilepsy in her mother; Heart disease in her father.    ROS:  Please see the history of present illness.   Otherwise, review of systems is positive for none.   All other systems are reviewed and negative.    PHYSICAL EXAM: VS:  BP (!) 102/56   Pulse 85   Ht 5\' 7"  (1.702 m)   Wt 128 lb (58.1 kg)   LMP 03/02/1994 (Approximate)   BMI 20.05 kg/m  , BMI Body mass index is 20.05 kg/m. GEN: Well nourished, well developed, in no acute distress  HEENT: normal  Neck: no JVD, carotid bruits, or masses Cardiac: iRRR; no murmurs, rubs, or gallops,no edema  Respiratory:  clear to auscultation bilaterally, normal work of breathing GI: soft, nontender, nondistended, + BS MS: no deformity or atrophy  Skin: warm and dry Neuro:  Strength and sensation are intact Psych: euthymic mood, full affect  EKG:  EKG is ordered today. Personal review of the ekg ordered shows sinus rhythm, PACs, rate 85  Recent Labs: 10/13/2017: Magnesium 1.9; TSH 1.37 02/16/2018: ALT 13 04/18/2018: BUN 13; Creatinine, Ser 0.88; Hemoglobin 14.1; Platelets 278; Potassium 3.4;  Sodium 136    Lipid Panel     Component Value Date/Time   CHOL 163 07/31/2015 1153   TRIG 70.0 07/31/2015 1153   HDL 63.30 07/31/2015 1153   CHOLHDL 3 07/31/2015 1153   VLDL 14.0 07/31/2015 1153   LDLCALC 85 07/31/2015 1153   LDLDIRECT 178.9 03/23/2013 1059     Wt Readings from Last 3 Encounters:  05/09/18 128 lb (58.1 kg)  05/04/18 127 lb (57.6 kg)  05/02/18 126 lb 6.4 oz (57.3 kg)      Other studies Reviewed: Additional studies/ records that were reviewed today include: TTE 12/29/17  Review of the above  records today demonstrates:  - Left ventricle: The cavity size was normal. Agustin thickness was   normal. Systolic function was normal. The estimated ejection   fraction was in the range of 55% to 60%. Left ventricular   diastolic function parameters were normal. - Aortic valve: Sclerosis without stenosis. - Mitral valve: Leaflets are severely thickened and calcified with   posterior leaflet prolapse but only trivial MR. - Atrial septum: A patent foramen ovale cannot be excluded. - Left atrium:  The atrium was normal in size.  30 day monitor 01/24/18 - personally reviewed  Brief episode of paroxysmal atrial fibrillation noted with maximum heart rate 163 bpm. Episode lasted approximately 2 minutes.  Occasional ventricular couplets. Occasional PVCs  Slow ventricular run of approximately 79 bpm, 7 beats. Asymptomatic.  Brief 10 beats of paroxysmal atrial tachycardia   ASSESSMENT AND PLAN:  1.  Paroxysmal atrial fibrillation: Currently in sinus rhythm and feeling well.  She had not been previously anticoagulated and thus we Heavenly Christine start her on Eliquis.  I discussed with her possibilities of medical management with likely flecainide versus ablation.  Risks and benefits of ablation were discussed and include bleeding, tamponade, heart block, stroke, damage to surrounding organs.  She understands these risks and is agreed to the procedure.  This patients CHA2DS2-VASc Score  and unadjusted Ischemic Stroke Rate (% per year) is equal to 2.2 % stroke rate/year from a score of 2  Above score calculated as 1 point each if present [CHF, HTN, DM, Vascular=MI/PAD/Aortic Plaque, Age if 65-74, or Female] Above score calculated as 2 points each if present [Age > 75, or Stroke/TIA/TE]  2.  Obstructive sleep apnea: CPAP compliance encouraged   Current medicines are reviewed at length with the patient today.   The patient does not have concerns regarding her medicines.  The following changes were made today:  none  Labs/ tests ordered today include:  Orders Placed This Encounter  Procedures  . EKG 12-Lead   Case discussed with primary cardiology  Disposition:   FU with Gwendolyne Welford 3 months  Signed, Jaggar Benko Jorja Loa, MD  05/09/2018 2:55 PM     [facility] [address] [place] [address] [phone] (office) [phone] (fax)

## 2018-05-09 NOTE — Telephone Encounter (Signed)
Informed patient of sleep study results and patient understanding was verbalized. Patient understands her sleep study showed they had a successful PAP titration and let DME know that orders are in EPIC. Please set up 10 week OV with me.   Upon patient request DME selection is [facility]. Patient understands she will be contacted by [facility] to set up her cpap. Patient understands to call if [facility] does not contact her with new setup in a timely manner. Patient understands they will be called once confirmation has been received from [facility] that they have received their new machine to schedule 10 week follow up appointment.  [facility] notified of new cpap order  Please add to airview Patient was grateful for the call and thanked me.

## 2018-05-09 NOTE — Telephone Encounter (Signed)
-----   Message from [name], MD sent at 05/08/2018  7:37 PM EDT ----- Please let patient know that they had a successful PAP titration and let DME know that orders are in EPIC.  Please set up 10 week OV with me.

## 2018-05-09 NOTE — Telephone Encounter (Signed)
Patient stated she would like the ablation done on 06/15/18

## 2018-05-09 NOTE — Patient Instructions (Addendum)
Medication Instructions:  Your physician has recommended you make the following change in your medication:  1. START Eliquis 5 mg twice a day -- you will start this medication 3 weeks prior to ablation procedure.  *If you need a refill on your cardiac medications before your next appointment, please call your pharmacy.  Labwork: To be determined once procedure is scheduled. *Will notify you of abnormal results, otherwise continue current treatment plan.  Testing/Procedures: Your physician has requested that you have cardiac CT within 7 days prior to your ablation. Cardiac computed tomography (CT) is a painless test that uses an x-ray machine to take clear, detailed pictures of your heart. For further information please visit [email or web address]. Please follow instruction below located under special instructions. You will get a call from our office to schedule the date for this test.  Your physician has recommended that you have an ablation. Catheter ablation is a medical procedure used to treat some cardiac arrhythmias (irregular heartbeats). During catheter ablation, a long, thin, flexible tube is put into a blood vessel in your groin (upper thigh), or neck. This tube is called an ablation catheter. It is then guided to your heart through the blood vessel. Radio frequency waves destroy small areas of heart tissue where abnormal heartbeats may cause an arrhythmia to start. Please see the instructions below located under special instructions  The following dates are available (these are subject to change):  4/10, 4/15, 4/17, 4/22, 4/24, 4/29  Follow-Up: Your physician recommends that you schedule a follow-up appointment in: 4 weeks, after your procedure on ______, with Roderic Palau NP in the AFib clinic.  Your physician recommends that you schedule a follow up appointment in: 3 months, after your procedure on ______, with Dr. Curt Bears.  *Please note that any paperwork needing to be filled out by  the provider will need to be addressed at the front desk prior to seeing the provider. Please note that any FMLA, disability or other documents regarding health condition is subject to a $25.00 charge that must be received prior to completion of paperwork in the form of a money order or check.  Thank you for choosing [facility]!! Trinidad Curet, RN [phone]   Any Other Special Instructions Will Be Listed Below   CARDIAC CT INSTRUCTIONS:  Please arrive at the [facility] main entrance of [facility] at ________ AM (30-45 minutes prior to test start time) [facility] [address] [place],  [address] [phone]  Proceed to the [facility] Radiology Department (First Floor).  Please follow these instructions carefully (unless otherwise directed):  On the Night Before the Test: . Drink plenty of water. . Do not consume any caffeinated/decaffeinated beverages or chocolate 12 hours prior to your test. . Do not take any antihistamines 12 hours prior to your test. . If you take Metformin do not take 24 hours prior to test.  On the Day of the Test: . Drink plenty of water. Do not drink any water within one hour of the test. . Do not eat any food 4 hours prior to the test. . You may take your regular medications prior to the test. . IF NOT ON A BETA BLOCKER - Take 50 mg of Lopressor (Metoprolol) one hour before the test.   . HOLD Furosemide morning of the test.  After the Test: . Drink plenty of water. . After receiving IV contrast, you may experience a mild flushed feeling. This is normal. . On occasion, you may  experience a mild rash up to 24 hours after the test. This is not dangerous. If this occurs, you can take Benadryl 25 mg and increase your fluid intake. . If you experience trouble breathing, this can be serious. If it is severe call 911 IMMEDIATELY. If it is mild, please call our office. . If you take any of these medications:  Glipizide/Metformin, Avandament, Glucavance, please do not take 48 hours after completing test.    Instructions for your ablation: 1. Please arrive at the [facility], Main Entrance "A", of [facility] at ______ on _______. 2. Do not eat or drink after midnight the night prior to the procedure. 3. Do not miss any doses of ELIQUIS prior to the morning of the procedure.  4. Do not take any medications the morning of the procedure. 5. Plan for an overnight stay in the hospital. 6. You will need someone to drive you home at discharge.

## 2018-05-13 ENCOUNTER — Encounter: Payer: Self-pay | Admitting: Internal Medicine

## 2018-05-13 ENCOUNTER — Other Ambulatory Visit: Payer: Self-pay

## 2018-05-13 ENCOUNTER — Ambulatory Visit (INDEPENDENT_AMBULATORY_CARE_PROVIDER_SITE_OTHER): Payer: PPO | Admitting: Internal Medicine

## 2018-05-13 DIAGNOSIS — E538 Deficiency of other specified B group vitamins: Secondary | ICD-10-CM | POA: Diagnosis not present

## 2018-05-13 DIAGNOSIS — R140 Abdominal distension (gaseous): Secondary | ICD-10-CM | POA: Diagnosis not present

## 2018-05-13 DIAGNOSIS — I480 Paroxysmal atrial fibrillation: Secondary | ICD-10-CM | POA: Diagnosis not present

## 2018-05-13 DIAGNOSIS — I4891 Unspecified atrial fibrillation: Secondary | ICD-10-CM | POA: Insufficient documentation

## 2018-05-13 NOTE — Assessment & Plan Note (Signed)
Flagyl po helped some Pelvic US - ok 05/2018 Florastor  po helped some  Gluten free diet

## 2018-05-13 NOTE — Progress Notes (Signed)
Subjective:  Patient ID: Brittany [name], female    DOB: [birth year]  Age: 73 y.o. MRN: [id number]  CC: No chief complaint on file.   HPI Brittany [name] presents for A fib, constipation, osteoporosis f/u  Outpatient Medications Prior to Visit  Medication Sig Dispense Refill  . apixaban (ELIQUIS) 5 MG TABS tablet Take 1 tablet (5 mg total) by mouth 2 (two) times daily. 60 tablet 3  . Ascorbic Acid (VITAMIN C PO) Take 1 tablet by mouth daily.    . Biotin 1000 MCG tablet Take 1,000 mcg by mouth daily.    . Cholecalciferol (VITAMIN D3) 1000 units CAPS Take 1,000 Units by mouth daily.    . citalopram (CELEXA) 40 MG tablet Take 1 tablet (40 mg total) by mouth daily. 90 tablet 3  . denosumab (PROLIA) 60 MG/ML SOLN injection Inject 60 mg into the skin once. Administer in upper arm, thigh, or abdomen (Patient taking differently: Inject 60 mg into the skin once. Administer in upper arm, thigh, or abdomen every 6 months.) 1.8 mL 1  . famotidine (PEPCID) 40 MG tablet Take 1 tablet (40 mg total) by mouth daily. 90 tablet 3  . levocetirizine (XYZAL) 5 MG tablet Take 5 mg by mouth daily.     . metoprolol tartrate (LOPRESSOR) 50 MG tablet Take 1 tablet (50 mg total) by mouth as needed (raacing heart rate). 30 tablet 0  . Omega-3 Fatty Acids (FISH OIL) 1200 MG CAPS Take 1,200 mg by mouth daily.     [name] saccharomyces boulardii (FLORASTOR) 250 MG capsule Take 1 capsule (250 mg total) by mouth 2 (two) times daily. 60 capsule 1  . VALERIAN ROOT PO Take 1 capsule by mouth at bedtime.    . vitamin B-12 (CYANOCOBALAMIN) 1000 MCG tablet Take 1,000 mcg by mouth daily.    [name] zolpidem (AMBIEN) 10 MG tablet Take 0.5-1 tablets (5-10 mg total) by mouth at bedtime as needed. for sleep 90 tablet 0  . SUMAtriptan (IMITREX) 100 MG tablet Take 1 tablet (100 mg total) by mouth once for 1 dose. May repeat in 2 hours if headache persists or recurs. 9 tablet 5   No facility-administered medications prior to visit.     ROS: Review  of Systems  Constitutional: Negative for activity change, appetite change, chills, fatigue and unexpected weight change.  HENT: Negative for congestion, mouth sores and sinus pressure.   Eyes: Negative for visual disturbance.  Respiratory: Negative for cough and chest tightness.   Gastrointestinal: Positive for constipation. Negative for abdominal pain and nausea.  Genitourinary: Negative for difficulty urinating, frequency and vaginal pain.  Musculoskeletal: Negative for back pain and gait problem.  Skin: Negative for pallor and rash.  Neurological: Negative for dizziness, tremors, weakness, numbness and headaches.  Psychiatric/Behavioral: Negative for confusion, sleep disturbance and suicidal ideas.    Objective:  BP 114/70 (BP Location: Left Arm, Patient Position: Sitting, Cuff Size: Normal)   Pulse 76   Temp 98 F (36.7 C) (Oral)   Ht 5\' 7"  (1.702 m)   Wt 125 lb (56.7 kg)   LMP 03/02/1994 (Approximate)   SpO2 97%   BMI 19.58 kg/m   BP Readings from Last 3 Encounters:  05/13/18 114/70  05/09/18 (!) 102/56  05/02/18 112/60    Wt Readings from Last 3 Encounters:  05/13/18 125 lb (56.7 kg)  05/09/18 128 lb (58.1 kg)  05/04/18 127 lb (57.6 kg)    Physical Exam Constitutional:      General: She is  not in acute distress.    Appearance: She is well-developed.  HENT:     Head: Normocephalic.     Right Ear: External ear normal.     Left Ear: External ear normal.     Nose: Nose normal.  Eyes:     General:        Right eye: No discharge.        Left eye: No discharge.     Conjunctiva/sclera: Conjunctivae normal.     Pupils: Pupils are equal, round, and reactive to light.  Neck:     Musculoskeletal: Normal range of motion and neck supple.     Thyroid: No thyromegaly.     Vascular: No JVD.     Trachea: No tracheal deviation.  Cardiovascular:     Rate and Rhythm: Normal rate. Rhythm irregular.     Heart sounds: Normal heart sounds. No murmur. No gallop.   Pulmonary:      Effort: No respiratory distress.     Breath sounds: No stridor. No wheezing.  Abdominal:     General: Bowel sounds are normal. There is no distension.     Palpations: Abdomen is soft. There is no mass.     Tenderness: There is no abdominal tenderness. There is no guarding or rebound.  Musculoskeletal:        General: No tenderness.  Lymphadenopathy:     Cervical: No cervical adenopathy.  Skin:    Findings: No erythema or rash.  Neurological:     Cranial Nerves: No cranial nerve deficit.     Motor: No abnormal muscle tone.     Coordination: Coordination normal.     Deep Tendon Reflexes: Reflexes normal.  Psychiatric:        Behavior: Behavior normal.        Thought Content: Thought content normal.        Judgment: Judgment normal.     Lab Results  Component Value Date   WBC 6.1 04/18/2018   HGB 14.1 04/18/2018   HCT 43.5 04/18/2018   PLT 278 04/18/2018   GLUCOSE 148 (H) 04/18/2018   CHOL 163 07/31/2015   TRIG 70.0 07/31/2015   HDL 63.30 07/31/2015   LDLDIRECT 178.9 03/23/2013   LDLCALC 85 07/31/2015   ALT 13 02/16/2018   AST 16 02/16/2018   NA 136 04/18/2018   K 3.4 (L) 04/18/2018   CL 104 04/18/2018   CREATININE 0.88 04/18/2018   BUN 13 04/18/2018   CO2 23 04/18/2018   TSH 1.37 10/13/2017   INR 1.03 04/18/2018    US Pelvic Complete With Transvaginal  Result Date: 05/05/2018 CLINICAL DATA:  Abdomen pain and bloating EXAM: TRANSABDOMINAL AND TRANSVAGINAL ULTRASOUND OF PELVIS TECHNIQUE: Both transabdominal and transvaginal ultrasound examinations of the pelvis were performed. Transabdominal technique was performed for global imaging of the pelvis including uterus, ovaries, adnexal regions, and pelvic cul-de-sac. It was necessary to proceed with endovaginal exam following the transabdominal exam to visualize the uterus endometrium and ovaries. COMPARISON:  None FINDINGS: Uterus Measurements: 8.8 x 2.7 x 4.1 cm = volume: 50 mL. Scattered echogenic foci within the  myometrium at the fundus, possible myometrial calcifications. Endometrium Thickness: 2.3 mm.  Small amount of fluid in the endometrial canal. Right ovary Nonvisualized Left ovary Nonvisualized. Other findings No abnormal free fluid. IMPRESSION: 1. Non visualized ovaries. 2. Small amount of nonspecific fluid in the endometrial canal. Electronically Signed   By: Donavan Foil M.D.   On: 05/05/2018 14:46    Assessment & Plan:  There are no diagnoses linked to this encounter.   No orders of the defined types were placed in this encounter.    Follow-up: No follow-ups on file.  Walker Kehr, MD

## 2018-05-13 NOTE — Patient Instructions (Signed)
  Gluten free trial for 4-6 weeks. OK to use gluten-free bread and gluten-free pasta.    Gluten-Free Diet for Celiac Disease, Adult The gluten-free diet includes all foods that do not contain gluten. Gluten is a protein that is found in wheat, rye, barley, and some other grains. Following the gluten-free diet is the only treatment for people with celiac disease. It helps to prevent damage to the intestines and improves or eliminates the symptoms of celiac disease. Following the gluten-free diet requires some planning. It can be challenging at first, but it gets easier with time and practice. There are more gluten-free options available today than ever before. If you need help finding gluten-free foods or if you have questions, talk with your diet and nutrition specialist (registered dietitian) or your health care provider. What do I need to know about a gluten-free diet?  All fruits, vegetables, and meats are safe to eat and do not contain gluten.  When grocery shopping, start by shopping in the produce, meat, and dairy sections. These sections are more likely to contain gluten-free foods. Then move to the aisles that contain packaged foods if you need to.  Read all food labels. Gluten is often added to foods. Always check the ingredient list and look for warnings, such as "may contain gluten."  Talk with your dietitian or health care provider before taking a gluten-free multivitamin or mineral supplement.  Be aware of gluten-free foods having contact with foods that contain gluten (cross-contamination). This can happen at home and with any processed foods. ? Talk with your health care provider or dietitian about how to reduce the risk of cross-contamination in your home. ? If you have questions about how a food is processed, ask the manufacturer. What key words help to identify gluten? Foods that list any of these key words on the label usually contain gluten:  Wheat, flour, enriched  flour, bromated flour, white flour, durum flour, graham flour, phosphated flour, self-rising flour, semolina, farina, barley (malt), rye, and oats.  Starch, dextrin, modified food starch, or cereal.  Thickening, fillers, or emulsifiers.  Malt flavoring, malt extract, or malt syrup.  Hydrolyzed vegetable protein.  In the [place] packaged foods that are gluten-free are required to be labeled "GF." These foods should be easy to identify and are safe to eat. In the [place] food companies are also required to list common food allergens, including wheat, on their labels. Recommended foods Grains  Amaranth, bean flours, 100% buckwheat flour, corn, millet, nut flours or nut meals, GF oats, quinoa, rice, sorghum, teff, rice wafers, pure cornmeal tortillas, popcorn, and hot cereals made from cornmeal. Hominy, rice, wild rice. Some Asian rice noodles or bean noodles. Arrowroot starch, corn bran, corn flour, corn germ, cornmeal, corn starch, potato flour, potato starch flour, and rice bran. Plain, brown, and sweet rice flours. Rice polish, soy flour, and tapioca starch. Vegetables  All plain fresh, frozen, and canned vegetables. Fruits  All plain fresh, frozen, canned, and dried fruits, and 100% fruit juices. Meats and other protein foods  All fresh beef, pork, poultry, fish, seafood, and eggs. Fish canned in water, oil, brine, or vegetable broth. Plain nuts and seeds, peanut butter. Some lunch meat and some frankfurters. Dried beans, dried peas, and lentils. Dairy  Fresh plain, dry, evaporated, or condensed milk. Cream, butter, sour cream, whipping cream, and most yogurts. Unprocessed cheese, most processed cheeses, some cottage cheese, some cream cheeses. Beverages  Coffee, tea, most herbal teas. Carbonated beverages and some root beers.   Wine, sake, and distilled spirits, such as gin, vodka, and whiskey. Most hard ciders. Fats and oils  Butter, margarine, vegetable oil, hydrogenated butter, olive  oil, shortening, lard, cream, and some mayonnaise. Some commercial salad dressings. Olives. Sweets and desserts  Sugar, honey, some syrups, molasses, jelly, and jam. Plain hard candy, marshmallows, and gumdrops. Pure cocoa powder. Plain chocolate. Custard and some pudding mixes. Gelatin desserts, sorbets, frozen ice pops, and sherbet. Cake, cookies, and other desserts prepared with allowed flours. Some commercial ice creams. Cornstarch, tapioca, and rice puddings. Seasoning and other foods  Some canned or frozen soups. Monosodium glutamate (MSG). Cider, rice, and wine vinegar. Baking soda and baking powder. Cream of tartar. Baking and nutritional yeast. Certain soy sauces made without wheat (ask your dietitian about specific brands that are allowed). Nuts, coconut, and chocolate. Salt, pepper, herbs, spices, flavoring extracts, imitation or artificial flavorings, natural flavorings, and food colorings. Some medicines and supplements. Some lip glosses and other cosmetics. Rice syrups. The items listed may not be a complete list. Talk with your dietitian about what dietary choices are best for you. Foods to avoid Grains  Barley, bran, bulgur, couscous, cracked wheat, [place], farro, graham, malt, matzo, semolina, wheat germ, and all wheat and rye cereals including spelt and kamut. Cereals containing malt as a flavoring, such as rice cereal. Noodles, spaghetti, macaroni, most packaged rice mixes, and all mixes containing wheat, rye, barley, or triticale. Vegetables  Most creamed vegetables and most vegetables canned in sauces. Some commercially prepared vegetables and salads. Fruits  Thickened or prepared fruits and some pie fillings. Some fruit snacks and fruit roll-ups. Meats and other protein foods  Any meat or meat alternative containing wheat, rye, barley, or gluten stabilizers. These are often marinated or packaged meats and lunch meats. Bread-containing products, such as Swiss steak,  croquettes, meatballs, and meatloaf. Most tuna canned in vegetable broth and turkey with hydrolyzed vegetable protein (HVP) injected as part of the basting. Seitan. Imitation fish. Eggs in sauces made from ingredients to avoid. Dairy  Commercial chocolate milk drinks and malted milk. Some non-dairy creamers. Any cheese product containing ingredients to avoid. Beverages  Certain cereal beverages. Beer, ale, malted milk, and some root beers. Some hard ciders. Some instant flavored coffees. Some herbal teas made with barley or with barley malt added. Fats and oils  Some commercial salad dressings. Sour cream containing modified food starch. Sweets and desserts  Some toffees. Chocolate-coated nuts (may be rolled in wheat flour) and some commercial candies and candy bars. Most cakes, cookies, donuts, pastries, and other baked goods. Some commercial ice cream. Ice cream cones. Commercially prepared mixes for cakes, cookies, and other desserts. Bread pudding and other puddings thickened with flour. Products containing brown rice syrup made with barley malt enzyme. Desserts and sweets made with malt flavoring. Seasoning and other foods  Some curry powders, some dry seasoning mixes, some gravy extracts, some meat sauces, some ketchups, some prepared mustards, and horseradish. Certain soy sauces. Malt vinegar. Bouillon and bouillon cubes that contain HVP. Some chip dips, and some chewing gum. Yeast extract. Brewer's yeast. Caramel color. Some medicines and supplements. Some lip glosses and other cosmetics. The items listed may not be a complete list. Talk with your dietitian about what dietary choices are best for you. Summary  Gluten is a protein that is found in wheat, rye, barley, and some other grains. The gluten-free diet includes all foods that do not contain gluten.  If you need help finding gluten-free foods or if   you have questions, talk with your diet and nutrition specialist (registered  dietitian) or your health care provider.  Read all food labels. Gluten is often added to foods. Always check the ingredient list and look for warnings, such as "may contain gluten." This information is not intended to replace advice given to you by your health care provider. Make sure you discuss any questions you have with your health care provider. Document Released: 02/16/2005 Document Revised: 12/02/2015 Document Reviewed: 12/02/2015 Elsevier Interactive Patient Education  2018 Elsevier Inc.   

## 2018-05-13 NOTE — Assessment & Plan Note (Signed)
Ablation pending Meds reviewed

## 2018-05-13 NOTE — Assessment & Plan Note (Signed)
On B12 

## 2018-05-16 NOTE — Telephone Encounter (Signed)
Patient has a 10 week follow up appointment scheduled for 07/26/18. Patient understands she needs to keep this appointment for insurance compliance. Patient was contacted by Ivin Booty at [facility]. .

## 2018-05-18 NOTE — Telephone Encounter (Signed)
Updated pt d/t COVID-19. Pt aware procedure will not be scheduled at this time.  She understands I will call her at a later date to schedule elective ablation procedure. Advised to call office, in the meantime, if she experiences any issues d/t AFib. She is agreeable to plan.

## 2018-05-25 DIAGNOSIS — G4733 Obstructive sleep apnea (adult) (pediatric): Secondary | ICD-10-CM | POA: Diagnosis not present

## 2018-05-31 ENCOUNTER — Telehealth: Payer: Self-pay | Admitting: Cardiology

## 2018-05-31 NOTE — Telephone Encounter (Signed)
  Patient is supposed to have a cardiac ablation on April 15th and would like to know if that is still going to happen.

## 2018-05-31 NOTE — Telephone Encounter (Signed)
Pt aware non-emergent procedures cancelled this month. Apologized for not following up sooner with her. Pt aware I will call her back once procedure can be rescheduled. She will call the office if she begins to have any issues with her AFib/ATach.

## 2018-06-11 ENCOUNTER — Other Ambulatory Visit: Payer: Self-pay | Admitting: Emergency Medicine

## 2018-06-25 DIAGNOSIS — G4733 Obstructive sleep apnea (adult) (pediatric): Secondary | ICD-10-CM | POA: Diagnosis not present

## 2018-06-28 DIAGNOSIS — G4733 Obstructive sleep apnea (adult) (pediatric): Secondary | ICD-10-CM | POA: Diagnosis not present

## 2018-07-11 ENCOUNTER — Telehealth: Payer: Self-pay | Admitting: Cardiology

## 2018-07-11 NOTE — Telephone Encounter (Signed)
Unable to reach to reschedule appt and get consent 07/11/18/

## 2018-07-21 ENCOUNTER — Telehealth: Payer: Self-pay | Admitting: Cardiology

## 2018-07-21 NOTE — Telephone Encounter (Signed)
Follow up:    Patient returning your call back from a few days ago concering her appt. Please call patient back.

## 2018-07-25 DIAGNOSIS — G4733 Obstructive sleep apnea (adult) (pediatric): Secondary | ICD-10-CM | POA: Diagnosis not present

## 2018-07-26 ENCOUNTER — Encounter: Payer: Self-pay | Admitting: Cardiology

## 2018-07-26 ENCOUNTER — Telehealth (INDEPENDENT_AMBULATORY_CARE_PROVIDER_SITE_OTHER): Payer: PPO | Admitting: Cardiology

## 2018-07-26 ENCOUNTER — Other Ambulatory Visit: Payer: Self-pay

## 2018-07-26 DIAGNOSIS — G4733 Obstructive sleep apnea (adult) (pediatric): Secondary | ICD-10-CM

## 2018-07-26 NOTE — Progress Notes (Signed)
Virtual Visit via Telephone Note   This visit type was conducted due to national recommendations for restrictions regarding the COVID-19 Pandemic (e.g. social distancing) in an effort to limit this patient's exposure and mitigate transmission in our community.  Due to her co-morbid illnesses, this patient is at least at moderate risk for complications without adequate follow up.  This format is felt to be most appropriate for this patient at this time.  All issues noted in this document were discussed and addressed.  A limited physical exam was performed with this format.  Please refer to the patient's chart for her consent to telehealth for [facility].  Evaluation Performed:  Follow-up visit  This visit type was conducted due to national recommendations for restrictions regarding the COVID-19 Pandemic (e.g. social distancing).  This format is felt to be most appropriate for this patient at this time.  All issues noted in this document were discussed and addressed.  No physical exam was performed (except for noted visual exam findings with Video Visits).  Please refer to the patient's chart (MyChart message for video visits and phone note for telephone visits) for the patient's consent to telehealth for [facility].  Date:  07/26/2018   ID:  Brittany [name], DOB 1945/08/22, MRN [id number]  Patient Location:  Home  Provider location:   [place]  PCP:  Plotnikov, Evie Lacks, MD  Cardiologist:  Candee Furbish, MD  Sleep Medicine:  [name], MD Electrophysiologist:  None   Chief Complaint:  OSA  History of Present Illness:    Brittany [name] is a 73 y.o. female who presents via audio/video conferencing for a telehealth visit today.    This is a pleasant 72 year old female who is followed by Dr. Marlou Porch for atrial fibrillation who was referred for sleep study.  She underwent home sleep study which showed severe obstructive sleep apnea with an AHI of 34.4/h and moderate oxygen desaturations  to 81%.  She underwent CPAP titration to 12 cm H2O.  She is now here for follow-up. She is doing well with her CPAP device and thinks that she has gotten used to it.  She the pressure is adequate.  She has not noticed an improvement in daytime sleepiness because she has had some issues with her mask leaking when she rolls onto her right side.  She uses a under the nose full face mask.  She likes the mask and wants to keep trying it.   She denies any significant mouth or nasal dryness or nasal congestion.  She does not think that he snores.    The patient does not have symptoms concerning for COVID-19 infection (fever, chills, cough, or new shortness of breath).   Prior CV studies:   The following studies were reviewed today:  PAP compliance download  Past Medical History:  Diagnosis Date  . A-fib ([place])   . Acid reflux   . Anemia   . Arthritis   . Breast cancer ([place]) 11/23/2007   right, ER/PR -, HER 2 -  . Cancer [facility])    breast right  triple recet neg, lumpect, chemo, XRT  . Esophageal stricture   . Gastritis 10/2007  . History of chemotherapy    neoadjuvant chemo, last dose 03/2008  . History of radiation therapy 07/25/08 -09/11/08   right breast  . Hx of colonic polyps 08/11/2016  . Hyperlipidemia   . Migraines    rare  . Osteopenia    Past Surgical History:  Procedure Laterality Date  .  BREAST LUMPECTOMY  06/26/2008   right, CA   XRT, chemo  . COLONOSCOPY    . EYE SURGERY     left eye cataract surgery 12/23/2015  . growth removed per right thigh    . JOINT REPLACEMENT  12/31/2015   RTK  . QUADRICEPS TENDON REPAIR Right 09/14/2016   Procedure: OPEN REPAIR QUADRICEP TENDON;  Surgeon: [name], MD;  Location: [facility];  Service: Orthopedics;  Laterality: Right;  . TOTAL KNEE ARTHROPLASTY Right 12/31/2015   Procedure: RIGHT TOTAL KNEE ARTHROPLASTY;  Surgeon: [name], MD;  Location: [facility];  Service: Orthopedics;  Laterality: Right;  . UPPER GASTROINTESTINAL ENDOSCOPY        Current Meds  Medication Sig  . Ascorbic Acid (VITAMIN C PO) Take 1 tablet by mouth daily.  . Biotin 1000 MCG tablet Take 1,000 mcg by mouth daily.  . Cholecalciferol (VITAMIN D3) 1000 units CAPS Take 1,000 Units by mouth daily.  . citalopram (CELEXA) 40 MG tablet Take 1 tablet (40 mg total) by mouth daily.  [name] denosumab (PROLIA) 60 MG/ML SOLN injection Inject 60 mg into the skin once. Administer in upper arm, thigh, or abdomen (Patient taking differently: Inject 60 mg into the skin once. Administer in upper arm, thigh, or abdomen every 6 months.)  . famotidine (PEPCID) 40 MG tablet Take 1 tablet (40 mg total) by mouth daily.  [name] levocetirizine (XYZAL) 5 MG tablet Take 5 mg by mouth daily as needed for allergies.   . metoprolol tartrate (LOPRESSOR) 50 MG tablet Take 1 tablet (50 mg total) by mouth as needed (raacing heart rate).  . Omega-3 Fatty Acids (FISH OIL) 1200 MG CAPS Take 1,200 mg by mouth daily.   [name] saccharomyces boulardii (FLORASTOR) 250 MG capsule Take 1 capsule (250 mg total) by mouth 2 (two) times daily. (Patient taking differently: Take 250 mg by mouth daily. )  . SUMAtriptan (IMITREX) 100 MG tablet Take 1 tablet (100 mg total) by mouth once for 1 dose. May repeat in 2 hours if headache persists or recurs.  [name] VALERIAN ROOT PO Take 1 capsule by mouth at bedtime.  . vitamin B-12 (CYANOCOBALAMIN) 1000 MCG tablet Take 1,000 mcg by mouth daily.  [name] zolpidem (AMBIEN) 10 MG tablet Take 0.5-1 tablets (5-10 mg total) by mouth at bedtime as needed. for sleep     Allergies:   Boniva [ibandronic acid] and Lipitor [atorvastatin]   Social History   Tobacco Use  . Smoking status: Former Smoker    Packs/day: 0.25    Years: 5.00    Pack years: 1.25    Types: Cigarettes    Last attempt to quit: 03/03/1967    Years since quitting: 51.4  . Smokeless tobacco: Never Used  . Tobacco comment: 6 cig per day when she was smoking/quit years ago  Substance Use Topics  . Alcohol use: Yes     Alcohol/week: 7.0 standard drinks    Types: 7 Cans of beer per week    Comment: 1 beer per day   . Drug use: No     Family Hx: The patient's family history includes Cancer in her father; Cancer (age of onset: 82) in her sister; Dementia in her mother; Epilepsy in her mother; Heart disease in her father.  ROS:   Please see the history of present illness.     All other systems reviewed and are negative.   Labs/Other Tests and Data Reviewed:    Recent Labs: 10/13/2017: Magnesium 1.9; TSH 1.37 02/16/2018: ALT 13  04/18/2018: BUN 13; Creatinine, Ser 0.88; Hemoglobin 14.1; Platelets 278; Potassium 3.4; Sodium 136   Recent Lipid Panel Lab Results  Component Value Date/Time   CHOL 163 07/31/2015 11:53 AM   TRIG 70.0 07/31/2015 11:53 AM   HDL 63.30 07/31/2015 11:53 AM   CHOLHDL 3 07/31/2015 11:53 AM   LDLCALC 85 07/31/2015 11:53 AM   LDLDIRECT 178.9 03/23/2013 10:59 AM    Wt Readings from Last 3 Encounters:  07/26/18 122 lb (55.3 kg)  05/13/18 125 lb (56.7 kg)  05/09/18 128 lb (58.1 kg)     Objective:    Vital Signs:  BP (!) 93/59   Pulse (!) 59   Ht 5\' 7"  (1.702 m)   Wt 122 lb (55.3 kg)   LMP 03/02/1994 (Approximate)   BMI 19.11 kg/m     ASSESSMENT & PLAN:    1.  OSA - the patient is tolerating PAP therapy well without any problems. The PAP download was reviewed today and showed an AHI of 1.8/hr on 12 cm H2O with 90% compliance in using more than 4 hours nightly.  The patient has been using and benefiting from PAP use and will continue to benefit from therapy. She has had some problems with her mask but things are getting better.  I encouraged her to call me if she starts to really struggle with her mask.  2.  COVID-19 Education:The signs and symptoms of COVID-19 were discussed with the patient and how to seek care for testing (follow up with PCP or arrange E-visit).  The importance of social distancing was discussed today.  Patient Risk:   After full review of this  patient's clinical status, I feel that they are at least moderate risk at this time.  Time:   Today, I have spent 15 minutes directly with the patient on video discussing medical problems including OSA.  We also reviewed the symptoms of COVID 19 and the ways to protect against contracting the virus with telehealth technology.  I spent an additional 5 minutes reviewing patient's chart including PAP compliance download.  Medication Adjustments/Labs and Tests Ordered: Current medicines are reviewed at length with the patient today.  Concerns regarding medicines are outlined above.  Tests Ordered: No orders of the defined types were placed in this encounter.  Medication Changes: No orders of the defined types were placed in this encounter.   Disposition:  Follow up in 1 year(s)  Signed, [name], MD  07/26/2018 2:38 PM    [place]

## 2018-07-26 NOTE — Patient Instructions (Signed)

## 2018-08-17 ENCOUNTER — Telehealth: Payer: Self-pay | Admitting: Cardiology

## 2018-08-17 NOTE — Telephone Encounter (Signed)
Patient initially had an ablation scheduled in April that got cancelled due to [facility] and she would like to know when it may get rescheduled

## 2018-08-18 ENCOUNTER — Inpatient Hospital Stay: Payer: PPO | Attending: Oncology

## 2018-08-18 ENCOUNTER — Other Ambulatory Visit: Payer: Self-pay

## 2018-08-18 ENCOUNTER — Inpatient Hospital Stay: Payer: PPO

## 2018-08-18 ENCOUNTER — Ambulatory Visit: Payer: PPO

## 2018-08-18 ENCOUNTER — Inpatient Hospital Stay: Payer: PPO | Admitting: Oncology

## 2018-08-18 VITALS — BP 113/75 | HR 76 | Temp 97.7°F | Resp 17 | Ht 67.0 in | Wt 123.7 lb

## 2018-08-18 DIAGNOSIS — Z7901 Long term (current) use of anticoagulants: Secondary | ICD-10-CM | POA: Diagnosis not present

## 2018-08-18 DIAGNOSIS — Z87891 Personal history of nicotine dependence: Secondary | ICD-10-CM

## 2018-08-18 DIAGNOSIS — I4891 Unspecified atrial fibrillation: Secondary | ICD-10-CM | POA: Diagnosis not present

## 2018-08-18 DIAGNOSIS — Z801 Family history of malignant neoplasm of trachea, bronchus and lung: Secondary | ICD-10-CM | POA: Diagnosis not present

## 2018-08-18 DIAGNOSIS — M810 Age-related osteoporosis without current pathological fracture: Secondary | ICD-10-CM

## 2018-08-18 DIAGNOSIS — Z79899 Other long term (current) drug therapy: Secondary | ICD-10-CM | POA: Diagnosis not present

## 2018-08-18 DIAGNOSIS — C50811 Malignant neoplasm of overlapping sites of right female breast: Secondary | ICD-10-CM

## 2018-08-18 DIAGNOSIS — Z853 Personal history of malignant neoplasm of breast: Secondary | ICD-10-CM | POA: Insufficient documentation

## 2018-08-18 DIAGNOSIS — Z803 Family history of malignant neoplasm of breast: Secondary | ICD-10-CM | POA: Diagnosis not present

## 2018-08-18 LAB — CMP (CANCER CENTER ONLY)
ALT: 15 U/L (ref 0–44)
AST: 20 U/L (ref 15–41)
Albumin: 4 g/dL (ref 3.5–5.0)
Alkaline Phosphatase: 61 U/L (ref 38–126)
Anion gap: 10 (ref 5–15)
BUN: 10 mg/dL (ref 8–23)
CO2: 28 mmol/L (ref 22–32)
Calcium: 9.1 mg/dL (ref 8.9–10.3)
Chloride: 102 mmol/L (ref 98–111)
Creatinine: 0.79 mg/dL (ref 0.44–1.00)
GFR, Est AFR Am: >60 mL/min (ref >60)
GFR, Estimated: >60 mL/min (ref >60)
Glucose, Bld: 94 mg/dL (ref 70–99)
Potassium: 3.6 mmol/L (ref 3.5–5.1)
Sodium: 140 mmol/L (ref 135–145)
Total Bilirubin: 0.3 mg/dL (ref 0.3–1.2)
Total Protein: 7.1 g/dL (ref 6.5–8.1)

## 2018-08-18 LAB — CBC WITH DIFFERENTIAL (CANCER CENTER ONLY)
Abs Immature Granulocytes: 0.01 10*3/uL (ref 0.00–0.07)
Basophils Absolute: 0.1 10*3/uL (ref 0.0–0.1)
Basophils Relative: 1 %
Eosinophils Absolute: 0.2 10*3/uL (ref 0.0–0.5)
Eosinophils Relative: 4 %
HCT: 41.2 % (ref 36.0–46.0)
Hemoglobin: 13.4 g/dL (ref 12.0–15.0)
Immature Granulocytes: 0 %
Lymphocytes Relative: 27 %
Lymphs Abs: 1.4 10*3/uL (ref 0.7–4.0)
MCH: 31.7 pg (ref 26.0–34.0)
MCHC: 32.5 g/dL (ref 30.0–36.0)
MCV: 97.4 fL (ref 80.0–100.0)
Monocytes Absolute: 0.5 10*3/uL (ref 0.1–1.0)
Monocytes Relative: 10 %
Neutro Abs: 3.1 10*3/uL (ref 1.7–7.7)
Neutrophils Relative %: 58 %
Platelet Count: 253 10*3/uL (ref 150–400)
RBC: 4.23 MIL/uL (ref 3.87–5.11)
RDW: 13 % (ref 11.5–15.5)
WBC Count: 5.3 10*3/uL (ref 4.0–10.5)
nRBC: 0 % (ref 0.0–0.2)

## 2018-08-18 MED ORDER — DENOSUMAB 60 MG/ML ~~LOC~~ SOSY
60.0000 mg | PREFILLED_SYRINGE | Freq: Once | SUBCUTANEOUS | Status: AC
  Administered 2018-08-18: 60 mg via SUBCUTANEOUS

## 2018-08-18 MED ORDER — DENOSUMAB 60 MG/ML ~~LOC~~ SOSY
PREFILLED_SYRINGE | SUBCUTANEOUS | Status: AC
  Filled 2018-08-18: qty 1

## 2018-08-18 NOTE — Patient Instructions (Signed)

## 2018-08-18 NOTE — Telephone Encounter (Signed)
Pt scheduled for virtual visit next week to discuss rescheduling ablation. Pt agreeable to plan and telemedicine visit.     Virtual Visit Pre-Appointment Phone Call  "(Name), I am calling you today to discuss your upcoming appointment. We are currently trying to limit exposure to the virus that causes COVID-19 by seeing patients at home rather than in the office."  1. "What is the BEST phone number to call the day of the visit?" - include this in appointment notes  2. "Do you have or have access to (through a family member/friend) a smartphone with video capability that we can use for your visit?" a. If yes - list this number in appt notes as "cell" (if different from BEST phone #) and list the appointment type as a VIDEO visit in appointment notes b. If no - list the appointment type as a PHONE visit in appointment notes  3. Confirm consent - "In the setting of the current Covid19 crisis, you are scheduled for a (phone or video) visit with your provider on (date) at (time).  Just as we do with many in-office visits, in order for you to participate in this visit, we must obtain consent.  If you'd like, I can send this to your mychart (if signed up) or email for you to review.  Otherwise, I can obtain your verbal consent now.  All virtual visits are billed to your insurance company just like a normal visit would be.  By agreeing to a virtual visit, we'd like you to understand that the technology does not allow for your provider to perform an examination, and thus may limit your provider's ability to fully assess your condition. If your provider identifies any concerns that need to be evaluated in person, we will make arrangements to do so.  Finally, though the technology is pretty good, we cannot assure that it will always work on either your or our end, and in the setting of a video visit, we may have to convert it to a phone-only visit.  In either situation, we cannot ensure that we have a secure  connection.  Are you willing to proceed?" STAFF: Did the patient verbally acknowledge consent to telehealth visit? Document YES/NO here: YES  4. Advise patient to be prepared - "Two hours prior to your appointment, go ahead and check your blood pressure, pulse, oxygen saturation, and your weight (if you have the equipment to check those) and write them all down. When your visit starts, your provider will ask you for this information. If you have an Apple Watch or Kardia device, please plan to have heart rate information ready on the day of your appointment. Please have a pen and paper handy nearby the day of the visit as well."  5. Give patient instructions for MyChart download to smartphone OR Doximity/Doxy.me as below if video visit (depending on what platform provider is using)  6. Inform patient they will receive a phone call 15 minutes prior to their appointment time (may be from unknown caller ID) so they should be prepared to answer    TELEPHONE CALL NOTE  Brittany [name] has been deemed a candidate for a follow-up tele-health visit to limit community exposure during the Covid-19 pandemic. I spoke with the patient via phone to ensure availability of phone/video source, confirm preferred email & phone number, and discuss instructions and expectations.  I reminded Brittany [name] to be prepared with any vital sign and/or heart rhythm information that could potentially be obtained via  home monitoring, at the time of her visit. I reminded Brittany [name] to expect a phone call prior to her visit.  [name], RN 08/18/2018 4:09 PM   INSTRUCTIONS FOR DOWNLOADING THE MYCHART APP TO SMARTPHONE  - The patient must first make sure to have activated MyChart and know their login information - If Apple, go to [employer] and type in MyChart in the search bar and download the app. If Android, ask patient to go to [employer] and type in [place] in the search bar and download the app. The app is free  but as with any other app downloads, their phone may require them to verify saved payment information or Apple/Android password.  - The patient will need to then log into the app with their MyChart username and password, and select [facility] as their healthcare provider to link the account. When it is time for your visit, go to the MyChart app, find appointments, and click Begin Video Visit. Be sure to Select Allow for your device to access the Microphone and Camera for your visit. You will then be connected, and your provider will be with you shortly.  **If they have any issues connecting, or need assistance please contact MyChart service desk [phone])**  **If using a computer, in order to ensure the best quality for their visit they will need to use either of the following Internet Browsers: [employer], or Google Chrome**  IF USING DOXIMITY or DOXY.ME - The patient will receive a link just prior to their visit by text.     FULL LENGTH CONSENT FOR TELE-HEALTH VISIT   I hereby voluntarily request, consent and authorize [facility] and its employed or contracted physicians, physician assistants, nurse practitioners or other licensed health care professionals (the Practitioner), to provide me with telemedicine health care services (the "Services") as deemed necessary by the treating Practitioner. I acknowledge and consent to receive the Services by the Practitioner via telemedicine. I understand that the telemedicine visit will involve communicating with the Practitioner through live audiovisual communication technology and the disclosure of certain medical information by electronic transmission. I acknowledge that I have been given the opportunity to request an in-person assessment or other available alternative prior to the telemedicine visit and am voluntarily participating in the telemedicine visit.  I understand that I have the right to withhold or withdraw my consent  to the use of telemedicine in the course of my care at any time, without affecting my right to future care or treatment, and that the Practitioner or I may terminate the telemedicine visit at any time. I understand that I have the right to inspect all information obtained and/or recorded in the course of the telemedicine visit and may receive copies of available information for a reasonable fee.  I understand that some of the potential risks of receiving the Services via telemedicine include:  [name] Delay or interruption in medical evaluation due to technological equipment failure or disruption; . Information transmitted may not be sufficient (e.g. poor resolution of images) to allow for appropriate medical decision making by the Practitioner; and/or  . In rare instances, security protocols could fail, causing a breach of personal health information.  Furthermore, I acknowledge that it is my responsibility to provide information about my medical history, conditions and care that is complete and accurate to the best of my ability. I acknowledge that Practitioner's advice, recommendations, and/or decision may be based on factors not within their control, such as  incomplete or inaccurate data provided by me or distortions of diagnostic images or specimens that may result from electronic transmissions. I understand that the practice of medicine is not an exact science and that Practitioner makes no warranties or guarantees regarding treatment outcomes. I acknowledge that I will receive a copy of this consent concurrently upon execution via email to the email address I last provided but may also request a printed copy by calling the office of [facility].    I understand that my insurance will be billed for this visit.   I have read or had this consent read to me. . I understand the contents of this consent, which adequately explains the benefits and risks of the Services being provided via telemedicine.  . I  have been provided ample opportunity to ask questions regarding this consent and the Services and have had my questions answered to my satisfaction. . I give my informed consent for the services to be provided through the use of telemedicine in my medical care  By participating in this telemedicine visit I agree to the above.

## 2018-08-18 NOTE — Progress Notes (Signed)
ID: Brittany [name]   DOB: Mar 20, 1945  MR#: [id number]  UUE#:[id number]  Patient Care Team: Cassandria Anger, MD as PCP - General Marlou Porch Thana Farr, MD as PCP - Cardiology (Cardiology) [name], [name], MD as Consulting Physician (Hematology and Oncology) [name], MD as Consulting Physician (Sleep Medicine) OTHER MD:    CHIEF COMPLAINT:  Triple negative Breast Cancer; osteoporosis  CURRENT TREATMENT: Observation; denosumab   INTERVAL HISTORY: Brittany [name] returns today for follow-up and treatment of her triple negative breast cancer. She continues under observation.  She receives denosumab every 6 months and is due for a dose today. She tolerates this well.   Her most recent bone density test was completed at [facility] on 05/25/2017 and showed a T-score of -2.2.  She underwent bilateral screening mammography at [facility] March of last year showing: breast density category C; no evidence of malignancy in either breast.  Her mammograms this year of course have been delayed because of the pandemic.  They are scheduled for later this month.   REVIEW OF SYSTEMS: Brittany [name] reports she is not getting the exercise she used to because of the gyms being closed. She walks her dog, but she is not doing any walking dedicated to exercise. A detailed review of systems was otherwise entirely negative.    HISTORY OF PRESENT ILLNESS: From original  intake note:  The patient noted a mass in her right breast and immediately brought it to Dr. Gareth Eagle attention 11/22/2007.  I should note that the patient had a negative routine screening mammogram at [facility] on 07/19/2007.  In the 11/22/2007 study, there was at least a 1.5-cm ill-defined density seen primarily on the right MLO view.  There were a few associated microcalcifications.  By palpation, Dr. Isaiah Blakes felt this was approximately 1.9 cm.  By ultrasound, this was an irregular hypoechoic mass measuring up to 1.9 cm.  There were a few irregular vessels  present by Doppler.  There were no abnormal lymph nodes noted.  Breast specific gamma imaging was performed the next day, 11/23/2007, and showed a normal left breast.  On the right, there was a 1.6-cm high-density focus of abnormal isotope activity.  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  HER2-neu was negative at 1+.   Her subsequent history is as detailed below.   PAST MEDICAL HISTORY: Past Medical History:  Diagnosis Date  . A-fib ([place])   . Acid reflux   . Anemia   . Arthritis   . Breast cancer ([place]) 11/23/2007   right, ER/PR -, HER 2 -  . Cancer [facility])    breast right  triple recet neg, lumpect, chemo, XRT  . Esophageal stricture   . Gastritis 10/2007  . History of chemotherapy    neoadjuvant chemo, last dose 03/2008  . History of radiation therapy 07/25/08 -09/11/08   right breast  . Hx of colonic polyps 08/11/2016  . Hyperlipidemia   . Migraines    rare  . Osteopenia   Significant for osteopenia, history of hiatal hernia with esophageal stricture, status post upper endoscopy under [facility] 10/10/2007 showing an erosive gastritis in a background of reactive gastropathy ([id number]).  She has a history of rare migraines, and when she was 73, she had a benign growth removed from the back of her right calf.    PAST  SURGICAL HISTORY: Past Surgical History:  Procedure Laterality Date  . BREAST LUMPECTOMY  06/26/2008   right, CA   XRT, chemo  . COLONOSCOPY    . EYE SURGERY     left eye cataract surgery 12/23/2015  . growth removed per right thigh    . JOINT REPLACEMENT  12/31/2015   RTK  . QUADRICEPS TENDON REPAIR Right 09/14/2016   Procedure: OPEN REPAIR QUADRICEP TENDON;  Surgeon: [name], MD;  Location: [facility];  Service: Orthopedics;  Laterality: Right;  . TOTAL KNEE ARTHROPLASTY  Right 12/31/2015   Procedure: RIGHT TOTAL KNEE ARTHROPLASTY;  Surgeon: [name], MD;  Location: [facility];  Service: Orthopedics;  Laterality: Right;  . UPPER GASTROINTESTINAL ENDOSCOPY      FAMILY HISTORY Family History  Problem Relation Age of Onset  . Heart disease Father   . Cancer Father        lung  . Cancer Sister 58       Breast & Colorectal  . Epilepsy Mother   . Dementia Mother   The patient's father died from lung cancer at the age of 51; he was not a smoker.  The patient's mother is alive at age 104; she has epilepsy.  The patient has a sister diagnosed with breast cancer when she was 81.  She is doing well.  The patient has a brother in good health.  There is no other history of breast or ovarian cancer in the family.  GYNECOLOGIC HISTORY: She is GX P3, first pregnancy to term age 73, last menstrual period when she was 73 years old.  She never took any hormones.     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  Their son, [name], works for [employer], an Automotive engineer, and is working for his [employer]; he has 2 children and lives in [place], right outside of [place].  They have a daughter, [name], who lives in [place] and has 3 children, and a son, [name], who just married a young woman from [place] and is now living in [place]  The patient attends the [employer].    ADVANCED DIRECTIVES:  HEALTH MAINTENANCE:  (Updated January 2015) Social History   Tobacco Use  . Smoking status: Former Smoker    Packs/day: 0.25    Years: 5.00    Pack years: 1.25    Types: Cigarettes    Quit date: 03/03/1967    Years since quitting: 51.4  . Smokeless tobacco: Never Used  . Tobacco comment: 6 cig per day when she was smoking/quit years ago  Substance Use Topics  . Alcohol use: Yes    Alcohol/week:  7.0 standard drinks    Types: 7 Cans of beer per week    Comment: 1 beer per day   . Drug use: No     Colonoscopy:  April 2007/Dr. Gessner  PAP: Not on file  Bone density: 05/25/2017 showed a T score of  -2.2   Lipid panel: January 2015/Dr. Plotnikov   Allergies  Allergen Reactions  . Boniva [Ibandronic Acid] Nausea Only  . Lipitor [Atorvastatin] Other (See Comments)    Muscle and joint pain(s)    Current Outpatient Medications  Medication Sig Dispense Refill  . apixaban (ELIQUIS) 5 MG TABS tablet Take 1 tablet (5 mg total) by mouth 2 (two) times daily. (Patient not taking:  Reported on 07/26/2018) 60 tablet 3  . Ascorbic Acid (VITAMIN C PO) Take 1 tablet by mouth daily.    . Biotin 1000 MCG tablet Take 1,000 mcg by mouth daily.    . Cholecalciferol (VITAMIN D3) 1000 units CAPS Take 1,000 Units by mouth daily.    . citalopram (CELEXA) 40 MG tablet Take 1 tablet (40 mg total) by mouth daily. 90 tablet 3  . denosumab (PROLIA) 60 MG/ML SOLN injection Inject 60 mg into the skin once. Administer in upper arm, thigh, or abdomen (Patient taking differently: Inject 60 mg into the skin once. Administer in upper arm, thigh, or abdomen every 6 months.) 1.8 mL 1  . famotidine (PEPCID) 40 MG tablet Take 1 tablet (40 mg total) by mouth daily. 90 tablet 3  . levocetirizine (XYZAL) 5 MG tablet Take 5 mg by mouth daily as needed for allergies.     . metoprolol tartrate (LOPRESSOR) 50 MG tablet Take 1 tablet (50 mg total) by mouth as needed (raacing heart rate). 30 tablet 0  . Omega-3 Fatty Acids (FISH OIL) 1200 MG CAPS Take 1,200 mg by mouth daily.     [name] saccharomyces boulardii (FLORASTOR) 250 MG capsule Take 1 capsule (250 mg total) by mouth 2 (two) times daily. (Patient taking differently: Take 250 mg by mouth daily. ) 60 capsule 1  . SUMAtriptan (IMITREX) 100 MG tablet Take 1 tablet (100 mg total) by mouth once for 1 dose. May repeat in 2 hours if headache persists or recurs. 9 tablet 5  . VALERIAN  ROOT PO Take 1 capsule by mouth at bedtime.    . vitamin B-12 (CYANOCOBALAMIN) 1000 MCG tablet Take 1,000 mcg by mouth daily.    [name] zolpidem (AMBIEN) 10 MG tablet Take 0.5-1 tablets (5-10 mg total) by mouth at bedtime as needed. for sleep 90 tablet 0   No current facility-administered medications for this visit.     OBJECTIVE: Middle-aged white woman in no acute distress  Vitals:   08/18/18 1429  BP: 113/75  Pulse: 76  Resp: 17  Temp: 97.7 F (36.5 C)  SpO2: 99%     Body mass index is 19.37 kg/m.    ECOG FS: 0 Filed Weights   08/18/18 1429  Weight: 123 lb 11.2 oz (56.1 kg)   Sclerae unicteric, EOMs intact Wearing a mask No cervical or supraclavicular adenopathy Lungs no rales or rhonchi Heart irregularly irregular rhythm, no murmur Abd soft, nontender, positive bowel sounds MSK no focal spinal tenderness, no upper extremity lymphedema Neuro: nonfocal, well oriented, appropriate affect Breasts: Status post right lumpectomy followed by radiation with no evidence of disease recurrence.  Left breast benign.  Both axillae are benign.  LAB RESULTS: Lab Results  Component Value Date   WBC 5.3 08/18/2018   NEUTROABS 3.1 08/18/2018   HGB 13.4 08/18/2018   HCT 41.2 08/18/2018   MCV 97.4 08/18/2018   PLT 253 08/18/2018      Chemistry      Component Value Date/Time   NA 140 08/18/2018 1412   NA 136 02/16/2017 1315   K 3.6 08/18/2018 1412   K 4.1 02/16/2017 1315   CL 102 08/18/2018 1412   CL 98 03/21/2012 1053   CO2 28 08/18/2018 1412   CO2 26 02/16/2017 1315   BUN 10 08/18/2018 1412   BUN 10.4 02/16/2017 1315   CREATININE 0.79 08/18/2018 1412   CREATININE 0.7 02/16/2017 1315      Component Value Date/Time   CALCIUM 9.1 08/18/2018 1412  CALCIUM 9.2 02/16/2017 1315   ALKPHOS 61 08/18/2018 1412   ALKPHOS 56 02/16/2017 1315   AST 20 08/18/2018 1412   AST 17 02/16/2017 1315   ALT 15 08/18/2018 1412   ALT 7 02/16/2017 1315   BILITOT 0.3 08/18/2018 1412   BILITOT  0.34 02/16/2017 1315        STUDIES: No results found.   ASSESSMENT: 73 y.o. [place] woman   (1) s/p right breast bopsy 11/2007 for a clinical  T1c N0, stage IA Invasive ductal carcinoma, grade 2-3, triple-negative with an MIB-1 of 50%.  (2) neoadjuvant chemotherapy consisted of docetaxel/ gemcitabine/ bevacizumab followed by doxorubicin/ cyclophosphamide/ bevacizumab as per the NSABP B-40 protocol.  Last bevacizumab dose was January of 2010.   (3) status-post right lumpectomy and sentinel lymph node dissection in April of 2010 showing a complete pathologic response   (4) She completed adjuvant radiation July 2010.   (5)  Osteoporosis: on vit D/ calcium; received zolendronate  OCT 2013 and SEPT 2014, started prolia/ denosumab 11/30/2013, to be repeated every 6 months  (a) dexa scan 05/15/2013 showed a T score of -2.3 (improved)  (b) DEXA scan 05/13/2015 showed a T score of -2.2 (stable)  (c) DEXA scan 05/25/2017 showed a T score of  -2.2   PLAN:  Caryn is now 10 years out from definitive surgery for her breast cancer with no evidence of disease recurrence.  This is very favorable.  She is receiving denosumab/Prolia every 6 months.  She tolerates this well.  Her bone density is stable  She will receive her next Prolia dose in 6 months and see me again in 12 months  She is keeping appropriate pandemic precautions.  She knows to call for any other issue that may develop before then.  She will see me again in a year.  She knows to call for any problems that may develop before the next visit.   [name], [name], MD  08/18/18 2:51 PM Medical Oncology and Hematology [facility] [address] [place], [place] [address] Tel. [phone]    Fax. [phone]   I, Wilburn Mylar, am acting as scribe for Dr. [name]. [name].  I, [name] MD, have reviewed the above documentation for accuracy and completeness, and I agree with the above.

## 2018-08-23 ENCOUNTER — Other Ambulatory Visit: Payer: Self-pay

## 2018-08-23 ENCOUNTER — Telehealth (INDEPENDENT_AMBULATORY_CARE_PROVIDER_SITE_OTHER): Payer: PPO | Admitting: Cardiology

## 2018-08-23 DIAGNOSIS — I480 Paroxysmal atrial fibrillation: Secondary | ICD-10-CM

## 2018-08-23 NOTE — Progress Notes (Signed)
Electrophysiology TeleHealth Note   Due to national recommendations of social distancing due to COVID 19, an audio/video telehealth visit is felt to be most appropriate for this patient at this time.  See Epic message for the patient's consent to telehealth for Brittany [name].   Date:  08/23/2018   ID:  Brittany [name], DOB May 07, 1945, MRN [id number]  Location: patient's home  Provider location: [address], [place]  Evaluation Performed: Follow-up visit  PCP:  Plotnikov, Georgina Quint, MD  Cardiologist:  Donato Schultz, MD  Electrophysiologist:  Dr Elberta Fortis  Chief Complaint:  AF  History of Present Illness:    Brittany [name] is a 73 y.o. female who presents via audio/video conferencing for a telehealth visit today.  Since last being seen in our clinic, the patient reports doing very well.  Today, she denies symptoms of palpitations, chest pain, shortness of breath,  lower extremity edema, dizziness, presyncope, or syncope.  The patient is otherwise without complaint today.  The patient denies symptoms of fevers, chills, cough, or new SOB worrisome for COVID 19.  She has a history significant for atrial fibrillation, breast cancer status post lumpectomy, chemotherapy, XRT.  She wore a cardiac monitor showed brief episodes of atrial fibrillation.  She also had an exercise treadmill test that showed SVT.  She presented to the emergency room 04/18/2018 with rapid atrial fibrillation and rates of 175.  She had previously agreed to ablation, but due to coronavirus concerns this was canceled.  Today, denies symptoms of palpitations, chest pain, shortness of breath, orthopnea, PND, lower extremity edema, claudication, dizziness, presyncope, syncope, bleeding, or neurologic sequela. The patient is tolerating medications without difficulties.  She is continued to have episodic palpitations.  She can tell when she is in atrial fibrillation but she has a strange feeling in her chest.  She  otherwise has felt well.  She has not had symptoms that were similar to what brought her to the emergency room.  Past Medical History:  Diagnosis Date  . A-fib (HCC)   . Acid reflux   . Anemia   . Arthritis   . Breast cancer (HCC) 11/23/2007   right, ER/PR -, HER 2 -  . Cancer [facility])    breast right  triple recet neg, lumpect, chemo, XRT  . Esophageal stricture   . Gastritis 10/2007  . History of chemotherapy    neoadjuvant chemo, last dose 03/2008  . History of radiation therapy 07/25/08 -09/11/08   right breast  . Hx of colonic polyps 08/11/2016  . Hyperlipidemia   . Migraines    rare  . Osteopenia     Past Surgical History:  Procedure Laterality Date  . BREAST LUMPECTOMY  06/26/2008   right, CA   XRT, chemo  . COLONOSCOPY    . EYE [name]     left eye cataract [name] 12/23/2015  . growth removed per right thigh    . JOINT REPLACEMENT  12/31/2015   RTK  . QUADRICEPS TENDON REPAIR Right 09/14/2016   Procedure: OPEN REPAIR QUADRICEP TENDON;  Surgeon: Durene Romans, MD;  Location: [facility];  Service: Orthopedics;  Laterality: Right;  . TOTAL KNEE ARTHROPLASTY Right 12/31/2015   Procedure: RIGHT TOTAL KNEE ARTHROPLASTY;  Surgeon: Durene Romans, MD;  Location: [facility];  Service: Orthopedics;  Laterality: Right;  . UPPER GASTROINTESTINAL ENDOSCOPY      Current Outpatient Medications  Medication Sig Dispense Refill  . apixaban (ELIQUIS) 5 MG TABS tablet Take 1 tablet (5  mg total) by mouth 2 (two) times daily. 60 tablet 3  . Ascorbic Acid (VITAMIN C PO) Take 1 tablet by mouth daily.    . Biotin 1000 MCG tablet Take 1,000 mcg by mouth daily.    . Cholecalciferol (VITAMIN D3) 1000 units CAPS Take 1,000 Units by mouth daily.    . citalopram (CELEXA) 40 MG tablet Take 1 tablet (40 mg total) by mouth daily. 90 tablet 3  . denosumab (PROLIA) 60 MG/ML SOLN injection Inject 60 mg into the skin once. Administer in upper arm, thigh, or abdomen (Patient taking differently: Inject 60 mg into the  skin once. Administer in upper arm, thigh, or abdomen every 6 months.) 1.8 mL 1  . famotidine (PEPCID) 40 MG tablet Take 1 tablet (40 mg total) by mouth daily. 90 tablet 3  . levocetirizine (XYZAL) 5 MG tablet Take 5 mg by mouth daily as needed for allergies.     . metoprolol tartrate (LOPRESSOR) 50 MG tablet Take 1 tablet (50 mg total) by mouth as needed (raacing heart rate). 30 tablet 0  . Omega-3 Fatty Acids (FISH OIL) 1200 MG CAPS Take 1,200 mg by mouth daily.     [name] saccharomyces boulardii (FLORASTOR) 250 MG capsule Take 1 capsule (250 mg total) by mouth 2 (two) times daily. (Patient taking differently: Take 250 mg by mouth daily. ) 60 capsule 1  . VALERIAN ROOT PO Take 1 capsule by mouth at bedtime.    . vitamin B-12 (CYANOCOBALAMIN) 1000 MCG tablet Take 1,000 mcg by mouth daily.    [name] zolpidem (AMBIEN) 10 MG tablet Take 0.5-1 tablets (5-10 mg total) by mouth at bedtime as needed. for sleep 90 tablet 0  . SUMAtriptan (IMITREX) 100 MG tablet Take 1 tablet (100 mg total) by mouth once for 1 dose. May repeat in 2 hours if headache persists or recurs. 9 tablet 5   No current facility-administered medications for this visit.     Allergies:   Boniva [ibandronic acid] and Lipitor [atorvastatin]   Social History:  The patient  reports that she quit smoking about 51 years ago. Her smoking use included cigarettes. She has a 1.25 pack-year smoking history. She has never used smokeless tobacco. She reports current alcohol use of about 7.0 standard drinks of alcohol per week. She reports that she does not use drugs.   Family History:  The patient's  family history includes Cancer in her father; Cancer (age of onset: 7) in her sister; Dementia in her mother; Epilepsy in her mother; Heart disease in her father.   ROS:  Please see the history of present illness.   All other systems are personally reviewed and negative.    Exam:    Vital Signs:  LMP 03/02/1994 (Approximate)   Well appearing, alert and  conversant, regular work of breathing,  good skin color Eyes- anicteric, neuro- grossly intact, skin- no apparent rash or lesions or cyanosis, mouth- oral mucosa is pink   Labs/Other Tests and Data Reviewed:    Recent Labs: 10/13/2017: Magnesium 1.9; TSH 1.37 08/18/2018: ALT 15; BUN 10; Creatinine 0.79; Hemoglobin 13.4; Platelet Count 253; Potassium 3.6; Sodium 140   Wt Readings from Last 3 Encounters:  08/18/18 123 lb 11.2 oz (56.1 kg)  07/26/18 122 lb (55.3 kg)  05/13/18 125 lb (56.7 kg)     Other studies personally reviewed: Additional studies/ records that were reviewed today include: ECG 05/09/2018 personally reviewed Review of the above records today demonstrates: Sinus rhythm with PACs  ASSESSMENT &  PLAN:    1.  Paroxysmal atrial fibrillation: Currently on Eliquis.  She is in sinus rhythm today but has continued to have episodic atrial fibrillation.  At this point she would prefer to avoid antiarrhythmic meds and Christabell Loseke plan thus for ablation.  Risks and benefits were discussed include bleeding, tamponade, heart block, stroke, damage to surrounding organs.  She understands the risks and is agreed to the procedure.  This patients CHA2DS2-VASc Score and unadjusted Ischemic Stroke Rate (% per year) is equal to 2.2 % stroke rate/year from a score of 2  Above score calculated as 1 point each if present [CHF, HTN, DM, Vascular=MI/PAD/Aortic Plaque, Age if 65-74, or Female] Above score calculated as 2 points each if present [Age > 75, or Stroke/TIA/TE]  2.  Obstructive sleep apnea: CPAP compliance encouraged    COVID 19 screen The patient denies symptoms of COVID 19 at this time.  The importance of social distancing was discussed today.  Follow-up: 3 months  Current medicines are reviewed at length with the patient today.   The patient does not have concerns regarding her medicines.  The following changes were made today:  none  Labs/ tests ordered today include:  No orders of  the defined types were placed in this encounter.    Patient Risk:  after full review of this patients clinical status, I feel that they are at moderate risk at this time.  Today, I have spent 8 minutes with the patient with telehealth technology discussing atrial fibrillation.    Signed, Tavarus Poteete Jorja Loa, MD  08/23/2018 3:37 PM     [facility] [address] [place] [address] [phone] (office) [phone] (fax)

## 2018-08-25 DIAGNOSIS — G4733 Obstructive sleep apnea (adult) (pediatric): Secondary | ICD-10-CM | POA: Diagnosis not present

## 2018-09-05 ENCOUNTER — Telehealth: Payer: Self-pay | Admitting: *Deleted

## 2018-09-05 DIAGNOSIS — I480 Paroxysmal atrial fibrillation: Secondary | ICD-10-CM

## 2018-09-05 NOTE — Telephone Encounter (Signed)
Will schedule ablation for 8/7. Pt aware I will follow up this week/next to review instructions. Patient verbalized understanding and agreeable to plan.

## 2018-09-14 NOTE — Telephone Encounter (Signed)
Ablation & CT instructions reviewed w/ pt and sent via MyChart. Covid screening scheduled for 8/4 and instructions reviewed. Aware office will call to arrange cardiac CT & post procedure f/u appts. Patient verbalized understanding and agreeable to plan.

## 2018-09-18 ENCOUNTER — Other Ambulatory Visit: Payer: Self-pay | Admitting: Internal Medicine

## 2018-09-19 ENCOUNTER — Other Ambulatory Visit: Payer: Self-pay | Admitting: Internal Medicine

## 2018-09-19 NOTE — Telephone Encounter (Signed)
[place] Controlled Database Checked Last filled: 08/23/17 # 90 LOV w/you: 05/13/18 Next appt w/you: 03/16/19

## 2018-09-21 ENCOUNTER — Telehealth: Payer: Self-pay | Admitting: Cardiology

## 2018-09-21 DIAGNOSIS — R300 Dysuria: Secondary | ICD-10-CM | POA: Diagnosis not present

## 2018-09-21 NOTE — Telephone Encounter (Signed)
°  Patient wanted to know if she needed to have labs done prior to her ablation. She is scheduled to have the ablation on 08/07, and just wants to have everything done in time so the procedure does not have to be postponed.

## 2018-09-21 NOTE — Telephone Encounter (Signed)
Called pt to clarify her needs. She states when she was scheduled for her CT, she was asked about lab work. She was under the impression she was ok with lab work since it had been drawn recently (June 18.) I advised pt this may be sufficient, but I would forward to [facility] for follow up. Pt understands Brittany [name] is out of the office this week, and she would be following up with her next week.  She verbalized understanding and had no additional questions.

## 2018-09-24 DIAGNOSIS — G4733 Obstructive sleep apnea (adult) (pediatric): Secondary | ICD-10-CM | POA: Diagnosis not present

## 2018-09-26 NOTE — Telephone Encounter (Signed)
Left message

## 2018-09-27 NOTE — Telephone Encounter (Signed)
Left message

## 2018-09-28 ENCOUNTER — Telehealth: Payer: Self-pay | Admitting: *Deleted

## 2018-09-28 NOTE — Telephone Encounter (Signed)
Left message

## 2018-09-28 NOTE — Telephone Encounter (Signed)
Informed pt that her last BMP is only a few days outside of the 6 week protocol for CT testing.  Pt is currently out of town and cannot come into the office this week for lab.   Pt advised to arrive 30 min prior to CT testing for STAT BMET.  Informed that they may be a be to use the last BMP w/o needing repeat lab. Will forward to Lisabeth Pick in radiology for her FYI.  Pt CT is scheduled for Monday.

## 2018-09-28 NOTE — Telephone Encounter (Signed)
Informed that COVID screening for her upcoming procedure has been changed to [place]. Instructions/directions reviewed w/ pt. Pt agreeable to plan.

## 2018-09-29 ENCOUNTER — Other Ambulatory Visit: Payer: Self-pay | Admitting: Internal Medicine

## 2018-10-01 ENCOUNTER — Other Ambulatory Visit: Payer: Self-pay | Admitting: Internal Medicine

## 2018-10-02 ENCOUNTER — Telehealth (HOSPITAL_COMMUNITY): Payer: Self-pay | Admitting: Emergency Medicine

## 2018-10-02 NOTE — Telephone Encounter (Signed)
Left message on voicemail with name and callback number Yaden Seith RN Navigator Cardiac Imaging [facility] [phone] Office [phone] Cell  

## 2018-10-03 ENCOUNTER — Ambulatory Visit (HOSPITAL_COMMUNITY): Admission: RE | Admit: 2018-10-03 | Payer: PPO | Source: Ambulatory Visit

## 2018-10-03 ENCOUNTER — Other Ambulatory Visit: Payer: Self-pay | Admitting: Internal Medicine

## 2018-10-03 ENCOUNTER — Ambulatory Visit (HOSPITAL_COMMUNITY)
Admission: RE | Admit: 2018-10-03 | Discharge: 2018-10-03 | Disposition: A | Discharge status: Home / Self Care | Payer: PPO | Source: Ambulatory Visit | Attending: Cardiology | Admitting: Cardiology

## 2018-10-03 ENCOUNTER — Other Ambulatory Visit: Payer: Self-pay

## 2018-10-03 DIAGNOSIS — I480 Paroxysmal atrial fibrillation: Secondary | ICD-10-CM | POA: Diagnosis not present

## 2018-10-03 DIAGNOSIS — G4733 Obstructive sleep apnea (adult) (pediatric): Secondary | ICD-10-CM | POA: Diagnosis not present

## 2018-10-03 LAB — POCT I-STAT CREATININE: Creatinine, Ser: 0.6 mg/dL (ref 0.44–1.00)

## 2018-10-03 MED ORDER — IOHEXOL 350 MG/ML SOLN
80.0000 mL | Freq: Once | INTRAVENOUS | Status: AC | PRN
  Administered 2018-10-03: 12:00:00 80 mL via INTRAVENOUS

## 2018-10-04 ENCOUNTER — Other Ambulatory Visit (HOSPITAL_COMMUNITY)
Admission: RE | Admit: 2018-10-04 | Discharge: 2018-10-04 | Disposition: A | Discharge status: Home / Self Care | Payer: PPO | Source: Ambulatory Visit | Attending: Cardiology | Admitting: Cardiology

## 2018-10-04 DIAGNOSIS — Z01812 Encounter for preprocedural laboratory examination: Secondary | ICD-10-CM | POA: Diagnosis not present

## 2018-10-04 DIAGNOSIS — Z20828 Contact with and (suspected) exposure to other viral communicable diseases: Secondary | ICD-10-CM | POA: Insufficient documentation

## 2018-10-04 LAB — SARS CORONAVIRUS 2 (TAT 6-24 HRS): SARS Coronavirus 2: NEGATIVE

## 2018-10-06 ENCOUNTER — Encounter (HOSPITAL_COMMUNITY): Payer: Self-pay | Admitting: Anesthesiology

## 2018-10-06 NOTE — Anesthesia Preprocedure Evaluation (Addendum)
Anesthesia Evaluation  Patient identified by MRN, date of birth, ID band Patient awake    Reviewed: Allergy & Precautions, NPO status , Patient's Chart, lab work & pertinent test results  Airway Mallampati: II  TM Distance: >3 FB Neck ROM: Full    Dental no notable dental hx. (+) Teeth Intact   Pulmonary former smoker,    Pulmonary exam normal breath sounds clear to auscultation       Cardiovascular Normal cardiovascular exam+ dysrhythmias Atrial Fibrillation  Rhythm:Regular Rate:Normal     Neuro/Psych  Headaches, PSYCHIATRIC DISORDERS Anxiety Depression    GI/Hepatic Neg liver ROS, hiatal hernia, GERD  Medicated and Controlled,Hx/o esophageal stricture   Endo/Other  Hyperlipidemia Hx/o right breast Ca- S/P mastectomy, chemoRx, RT  Renal/GU negative Renal ROS   Hx/o cystits    Musculoskeletal  (+) Arthritis , Osteoarthritis,    Abdominal   Peds  Hematology  (+) anemia , Eliquis therapy- last dose last pm   Anesthesia Other Findings   Reproductive/Obstetrics                           Anesthesia Physical Anesthesia Plan  ASA: III  Anesthesia Plan: General   Post-op Pain Management:    Induction: Intravenous  PONV Risk Score and Plan: 3 and Ondansetron, Dexamethasone and Treatment may vary due to age or medical condition  Airway Management Planned: Oral ETT  Additional Equipment:   Intra-op Plan:   Post-operative Plan: Extubation in OR  Informed Consent: I have reviewed the patients History and Physical, chart, labs and discussed the procedure including the risks, benefits and alternatives for the proposed anesthesia with the patient or authorized representative who has indicated his/her understanding and acceptance.     Dental advisory given  Plan Discussed with: CRNA and Surgeon  Anesthesia Plan Comments:        Anesthesia Quick Evaluation

## 2018-10-07 ENCOUNTER — Other Ambulatory Visit: Payer: Self-pay

## 2018-10-07 ENCOUNTER — Encounter (HOSPITAL_COMMUNITY): Payer: Self-pay | Admitting: Anesthesiology

## 2018-10-07 ENCOUNTER — Ambulatory Visit (HOSPITAL_COMMUNITY): Payer: PPO | Admitting: Anesthesiology

## 2018-10-07 ENCOUNTER — Ambulatory Visit (HOSPITAL_COMMUNITY)
Admission: RE | Admit: 2018-10-07 | Discharge: 2018-10-07 | Disposition: A | Discharge status: Home / Self Care | Payer: PPO | Attending: Cardiology | Admitting: Cardiology

## 2018-10-07 ENCOUNTER — Encounter (HOSPITAL_COMMUNITY)
Admission: RE | Disposition: A | Discharge status: Home / Self Care | Payer: PPO | Source: Home / Self Care | Attending: Cardiology

## 2018-10-07 DIAGNOSIS — G4733 Obstructive sleep apnea (adult) (pediatric): Secondary | ICD-10-CM | POA: Diagnosis not present

## 2018-10-07 DIAGNOSIS — M8580 Other specified disorders of bone density and structure, unspecified site: Secondary | ICD-10-CM | POA: Insufficient documentation

## 2018-10-07 DIAGNOSIS — Z87891 Personal history of nicotine dependence: Secondary | ICD-10-CM | POA: Insufficient documentation

## 2018-10-07 DIAGNOSIS — Z7901 Long term (current) use of anticoagulants: Secondary | ICD-10-CM | POA: Diagnosis not present

## 2018-10-07 DIAGNOSIS — K219 Gastro-esophageal reflux disease without esophagitis: Secondary | ICD-10-CM | POA: Diagnosis not present

## 2018-10-07 DIAGNOSIS — F418 Other specified anxiety disorders: Secondary | ICD-10-CM | POA: Diagnosis not present

## 2018-10-07 DIAGNOSIS — I480 Paroxysmal atrial fibrillation: Secondary | ICD-10-CM | POA: Diagnosis not present

## 2018-10-07 DIAGNOSIS — Z9221 Personal history of antineoplastic chemotherapy: Secondary | ICD-10-CM | POA: Insufficient documentation

## 2018-10-07 DIAGNOSIS — Z923 Personal history of irradiation: Secondary | ICD-10-CM | POA: Diagnosis not present

## 2018-10-07 DIAGNOSIS — E785 Hyperlipidemia, unspecified: Secondary | ICD-10-CM | POA: Diagnosis not present

## 2018-10-07 DIAGNOSIS — Z853 Personal history of malignant neoplasm of breast: Secondary | ICD-10-CM | POA: Diagnosis not present

## 2018-10-07 DIAGNOSIS — I4891 Unspecified atrial fibrillation: Secondary | ICD-10-CM | POA: Diagnosis not present

## 2018-10-07 HISTORY — PX: ATRIAL FIBRILLATION ABLATION: EP1191

## 2018-10-07 LAB — BASIC METABOLIC PANEL
Anion gap: 10 (ref 5–15)
BUN: 13 mg/dL (ref 8–23)
CO2: 25 mmol/L (ref 22–32)
Calcium: 9.3 mg/dL (ref 8.9–10.3)
Chloride: 101 mmol/L (ref 98–111)
Creatinine, Ser: 0.66 mg/dL (ref 0.44–1.00)
GFR calc Af Amer: >60 mL/min (ref >60)
GFR calc non Af Amer: >60 mL/min (ref >60)
Glucose, Bld: 95 mg/dL (ref 70–99)
Potassium: 3.9 mmol/L (ref 3.5–5.1)
Sodium: 136 mmol/L (ref 135–145)

## 2018-10-07 LAB — POCT ACTIVATED CLOTTING TIME
Activated Clotting Time: 323 seconds
Activated Clotting Time: 334 seconds
Activated Clotting Time: 142 seconds

## 2018-10-07 LAB — CBC
HCT: 42.3 % (ref 36.0–46.0)
Hemoglobin: 13.9 g/dL (ref 12.0–15.0)
MCH: 31.4 pg (ref 26.0–34.0)
MCHC: 32.9 g/dL (ref 30.0–36.0)
MCV: 95.7 fL (ref 80.0–100.0)
Platelets: 286 10*3/uL (ref 150–400)
RBC: 4.42 MIL/uL (ref 3.87–5.11)
RDW: 12.7 % (ref 11.5–15.5)
WBC: 5.6 10*3/uL (ref 4.0–10.5)
nRBC: 0 % (ref 0.0–0.2)

## 2018-10-07 SURGERY — ATRIAL FIBRILLATION ABLATION
Anesthesia: General

## 2018-10-07 MED ORDER — BUPIVACAINE HCL (PF) 0.25 % IJ SOLN
INTRAMUSCULAR | Status: DC | PRN
  Administered 2018-10-07: 20 mL

## 2018-10-07 MED ORDER — SODIUM CHLORIDE 0.9 % IV SOLN
INTRAVENOUS | Status: DC | PRN
  Administered 2018-10-07: 07:00:00 25 ug/min via INTRAVENOUS

## 2018-10-07 MED ORDER — ROCURONIUM BROMIDE 10 MG/ML (PF) SYRINGE
PREFILLED_SYRINGE | INTRAVENOUS | Status: DC | PRN
  Administered 2018-10-07: 50 mg via INTRAVENOUS

## 2018-10-07 MED ORDER — BUPIVACAINE HCL (PF) 0.25 % IJ SOLN
INTRAMUSCULAR | Status: AC
  Filled 2018-10-07: qty 60

## 2018-10-07 MED ORDER — PROTAMINE SULFATE 10 MG/ML IV SOLN
INTRAVENOUS | Status: DC | PRN
  Administered 2018-10-07: 50 mg via INTRAVENOUS

## 2018-10-07 MED ORDER — LIDOCAINE 2% (20 MG/ML) 5 ML SYRINGE
INTRAMUSCULAR | Status: DC | PRN
  Administered 2018-10-07: 60 mg via INTRAVENOUS

## 2018-10-07 MED ORDER — HEPARIN (PORCINE) IN NACL 1000-0.9 UT/500ML-% IV SOLN
INTRAVENOUS | Status: DC | PRN
  Administered 2018-10-07 (×5): 500 mL

## 2018-10-07 MED ORDER — DOBUTAMINE IN D5W 4-5 MG/ML-% IV SOLN
INTRAVENOUS | Status: AC
  Filled 2018-10-07: qty 250

## 2018-10-07 MED ORDER — HEPARIN SODIUM (PORCINE) 1000 UNIT/ML IJ SOLN
INTRAMUSCULAR | Status: AC
  Filled 2018-10-07: qty 1

## 2018-10-07 MED ORDER — DOBUTAMINE IN D5W 4-5 MG/ML-% IV SOLN
INTRAVENOUS | Status: DC | PRN
  Administered 2018-10-07: 20 ug/kg/min via INTRAVENOUS

## 2018-10-07 MED ORDER — HEPARIN (PORCINE) IN NACL 1000-0.9 UT/500ML-% IV SOLN
INTRAVENOUS | Status: AC
  Filled 2018-10-07: qty 2500

## 2018-10-07 MED ORDER — PROPOFOL 10 MG/ML IV BOLUS
INTRAVENOUS | Status: DC | PRN
  Administered 2018-10-07: 120 mg via INTRAVENOUS

## 2018-10-07 MED ORDER — ONDANSETRON HCL 4 MG/2ML IJ SOLN
INTRAMUSCULAR | Status: DC | PRN
  Administered 2018-10-07: 4 mg via INTRAVENOUS

## 2018-10-07 MED ORDER — SUGAMMADEX SODIUM 200 MG/2ML IV SOLN
INTRAVENOUS | Status: DC | PRN
  Administered 2018-10-07: 150 mg via INTRAVENOUS

## 2018-10-07 MED ORDER — SODIUM CHLORIDE 0.9 % IV SOLN: 250.0000 mL | INTRAVENOUS | Status: DC | PRN

## 2018-10-07 MED ORDER — SODIUM CHLORIDE 0.9 % IV SOLN
INTRAVENOUS | Status: DC
  Administered 2018-10-07: 07:00:00 via INTRAVENOUS

## 2018-10-07 MED ORDER — FENTANYL CITRATE (PF) 100 MCG/2ML IJ SOLN
INTRAMUSCULAR | Status: DC | PRN
  Administered 2018-10-07 (×2): 50 ug via INTRAVENOUS

## 2018-10-07 MED ORDER — DEXAMETHASONE SODIUM PHOSPHATE 10 MG/ML IJ SOLN
INTRAMUSCULAR | Status: DC | PRN
  Administered 2018-10-07: 5 mg via INTRAVENOUS

## 2018-10-07 MED ORDER — MIDAZOLAM HCL 5 MG/5ML IJ SOLN
INTRAMUSCULAR | Status: DC | PRN
  Administered 2018-10-07: 1 mg via INTRAVENOUS

## 2018-10-07 MED ORDER — HEPARIN SODIUM (PORCINE) 1000 UNIT/ML IJ SOLN
INTRAMUSCULAR | Status: DC | PRN
  Administered 2018-10-07: 1000 [IU] via INTRAVENOUS

## 2018-10-07 MED ORDER — HEPARIN SODIUM (PORCINE) 1000 UNIT/ML IJ SOLN
INTRAMUSCULAR | Status: DC | PRN
  Administered 2018-10-07: 12000 [IU] via INTRAVENOUS
  Administered 2018-10-07: 1000 [IU] via INTRAVENOUS
  Administered 2018-10-07: 2000 [IU] via INTRAVENOUS

## 2018-10-07 SURGICAL SUPPLY — 20 items
BLANKET WARM UNDERBOD FULL ACC (MISCELLANEOUS) ×3 IMPLANT
CATH MAPPNG PENTARAY F 2-6-2MM (CATHETERS) IMPLANT
CATH SMTCH THERMOCOOL SF DF (CATHETERS) ×2 IMPLANT
CATH SOUNDSTAR ECO REPROCESSED (CATHETERS) ×2 IMPLANT
CATH WEBSTER BI DIR CS D-F CRV (CATHETERS) ×2 IMPLANT
COVER SWIFTLINK CONNECTOR (BAG) ×3 IMPLANT
PACK EP LATEX FREE (CUSTOM PROCEDURE TRAY) ×3
PACK EP LF (CUSTOM PROCEDURE TRAY) ×1 IMPLANT
PAD PRO RADIOLUCENT 2001M-C (PAD) ×3 IMPLANT
PATCH CARTO3 (PAD) ×2 IMPLANT
PENTARAY F 2-6-2MM (CATHETERS) ×3
SHEATH AVANTI 11F 11CM (SHEATH) ×2 IMPLANT
SHEATH BAYLIS SUREFLEX  M 8.5 (SHEATH) ×2
SHEATH BAYLIS SUREFLEX M 8.5 (SHEATH) IMPLANT
SHEATH BAYLIS TRANSSEPTAL 98CM (NEEDLE) ×2 IMPLANT
SHEATH CARTO VIZIGO SM CVD (SHEATH) ×2 IMPLANT
SHEATH PINNACLE 7F 10CM (SHEATH) ×2 IMPLANT
SHEATH PINNACLE 8F 10CM (SHEATH) ×4 IMPLANT
SHEATH PINNACLE 9F 10CM (SHEATH) ×4 IMPLANT
TUBING SMART ABLATE COOLFLOW (TUBING) ×2 IMPLANT

## 2018-10-07 NOTE — H&P (Signed)
Electrophysiology TeleHealth Note   Due to national recommendations of social distancing due to COVID 19, an audio/video telehealth visit is felt to be most appropriate for this patient at this time.  See Epic message for the patient's consent to telehealth for [facility].   Date:  10/07/2018   ID:  Brittany [name], DOB 01/19/1946, MRN [id number]  Location: patient's home  Provider location: [address] [place]  Evaluation Performed: Follow-up visit  PCP:  Plotnikov, Evie Lacks, MD  Cardiologist:  Candee Furbish, MD  Electrophysiologist:  Dr Curt Bears  Chief Complaint:  AF  History of Present Illness:    Brittany [name] is a 73 y.o. female who presents via audio/video conferencing for a telehealth visit today.  Since last being seen in our clinic, the patient reports doing very well.  Today, she denies symptoms of palpitations, chest pain, shortness of breath,  lower extremity edema, dizziness, presyncope, or syncope.  The patient is otherwise without complaint today.  The patient denies symptoms of fevers, chills, cough, or new SOB worrisome for COVID 19.  Today, denies symptoms of palpitations, chest pain, shortness of breath, orthopnea, PND, lower extremity edema, claudication, dizziness, presyncope, syncope, bleeding, or neurologic sequela. The patient is tolerating medications without difficulties. Plan for ablaiton today.   Past Medical History:  Diagnosis Date  . A-fib ([place])   . Acid reflux   . Anemia   . Arthritis   . Breast cancer ([place]) 11/23/2007   right, ER/PR -, HER 2 -  . Cancer [facility])    breast right  triple recet neg, lumpect, chemo, XRT  . Esophageal stricture   . Gastritis 10/2007  . History of chemotherapy    neoadjuvant chemo, last dose 03/2008  . History of radiation therapy 07/25/08 -09/11/08   right breast  . Hx of colonic polyps 08/11/2016  . Hyperlipidemia   . Migraines    rare  . Osteopenia     Past Surgical History:  Procedure  Laterality Date  . BREAST LUMPECTOMY  06/26/2008   right, CA   XRT, chemo  . COLONOSCOPY    . EYE SURGERY     left eye cataract surgery 12/23/2015  . growth removed per right thigh    . JOINT REPLACEMENT  12/31/2015   RTK  . QUADRICEPS TENDON REPAIR Right 09/14/2016   Procedure: OPEN REPAIR QUADRICEP TENDON;  Surgeon: [name], MD;  [facility];  Service: Orthopedics;  Laterality: Right;  . TOTAL KNEE ARTHROPLASTY Right 12/31/2015   Procedure: RIGHT TOTAL KNEE ARTHROPLASTY;  Surgeon: [name], MD;  [facility];  Service: Orthopedics;  Laterality: Right;  . UPPER GASTROINTESTINAL ENDOSCOPY      Current Facility-Administered Medications  Medication Dose Route Frequency Provider Last Rate Last Dose  . 0.9 %  sodium chloride infusion   Intravenous Continuous Camnitz, Ocie Doyne, MD        Allergies:   Boniva [ibandronic acid] and Lipitor [atorvastatin]   Social History:  The patient  reports that she quit smoking about 51 years ago. Her smoking use included cigarettes. She has a 1.25 pack-year smoking history. She has never used smokeless tobacco. She reports current alcohol use of about 7.0 standard drinks of alcohol per week. She reports that she does not use drugs.   Family History:  The patient's  family history includes Cancer in her father; Cancer (age of onset: 31) in her sister; Dementia in her mother; Epilepsy in her mother; Heart disease  in her father.   ROS:  Please see the history of present illness.   All other systems are personally reviewed and negative.    Exam:    Vital Signs:  BP 140/82   Pulse 100   Temp (!) 97.4 F (36.3 C) (Temporal)   Ht 5\' 7"  (1.702 m)   Wt 55.3 kg   LMP 03/02/1994 (Approximate)   SpO2 97%   BMI 19.11 kg/m   GEN: Well nourished, well developed, in no acute distress  HEENT: normal  Neck: no JVD, carotid bruits, or masses Cardiac: RRR; no murmurs, rubs, or gallops,no edema  Respiratory:  clear to auscultation  bilaterally, normal work of breathing GI: soft, nontender, nondistended, + BS MS: no deformity or atrophy  Skin: warm and dry Neuro:  Strength and sensation are intact Psych: euthymic mood, full affect    Labs/Other Tests and Data Reviewed:    Recent Labs: 10/13/2017: Magnesium 1.9; TSH 1.37 08/18/2018: ALT 15 10/07/2018: BUN 13; Creatinine, Ser 0.66; Hemoglobin 13.9; Platelets 286; Potassium 3.9; Sodium 136   Wt Readings from Last 3 Encounters:  10/07/18 55.3 kg  08/18/18 56.1 kg  07/26/18 55.3 kg     Other studies personally reviewed: Additional studies/ records that were reviewed today include: ECG 05/09/2018 personally reviewed Review of the above records today demonstrates: Sinus rhythm with PACs  ASSESSMENT & PLAN:    1.  Paroxysmal atrial fibrillation: Currently on Eliquis.  She is in sinus rhythm today but has continued to have episodic atrial fibrillation.  At this point she would prefer to avoid antiarrhythmic meds and Brittany plan thus for ablation.  Risks and benefits were discussed include bleeding, tamponade, heart block, stroke, damage to surrounding organs.  She understands the risks and is agreed to the procedure.  This patients CHA2DS2-VASc Score and unadjusted Ischemic Stroke Rate (% per year) is equal to 2.2 % stroke rate/year from a score of 2  Above score calculated as 1 point each if present [CHF, HTN, DM, Vascular=MI/PAD/Aortic Plaque, Age if 65-74, or Female] Above score calculated as 2 points each if present [Age > 75, or Stroke/TIA/TE]  2.  Obstructive sleep apnea: CPAP compliance encouraged  Brittany [name] has presented today for surgery, with the diagnosis of atrial fibrillaton.  The various methods of treatment have been discussed with the patient and family. After consideration of risks, benefits and other options for treatment, the patient has consented to  Procedure(s): Catheter ablation as a surgical intervention .  Risks include but not limited to  bleeding, tamponade, heart block, stroke, damage to surrounding organs, among others. The patient's history has been reviewed, patient examined, no change in status, stable for surgery.  I have reviewed the patient's chart and labs.  Questions were answered to the patient's satisfaction.    Brittany Lai, MD 10/07/2018 7:10 AM      Signed, Brittany Meredith Leeds, MD  10/07/2018 7:10 AM

## 2018-10-07 NOTE — Transfer of Care (Signed)
Immediate Anesthesia Transfer of Care Note  Patient: Brittany [name]  Procedure(s) Performed: ATRIAL FIBRILLATION ABLATION (N/A )  Patient Location: PACU  Anesthesia Type:General  Level of Consciousness: awake, alert  and oriented  Airway & Oxygen Therapy: Patient Spontanous Breathing  Post-op Assessment: Report given to RN and Post -op Vital signs reviewed and stable  Post vital signs: Reviewed and stable  Last Vitals:  Vitals Value Taken Time  BP    Temp    Pulse 91 10/07/18 1007  Resp 14 10/07/18 1007  SpO2 97 % 10/07/18 1007  Vitals shown include unvalidated device data.  Last Pain:  Vitals:   10/07/18 0559  TempSrc:   PainSc: 0-No pain         Complications: No apparent anesthesia complications

## 2018-10-07 NOTE — Anesthesia Postprocedure Evaluation (Signed)
Anesthesia Post Note  Patient: Brittany [name]  Procedure(s) Performed: ATRIAL FIBRILLATION ABLATION (N/A )     Patient location during evaluation: PACU Anesthesia Type: General Level of consciousness: awake and alert and oriented Pain management: pain level controlled Vital Signs Assessment: post-procedure vital signs reviewed and stable Respiratory status: spontaneous breathing, nonlabored ventilation and respiratory function stable Cardiovascular status: blood pressure returned to baseline and stable Postop Assessment: no apparent nausea or vomiting Anesthetic complications: no    Last Vitals:  Vitals:   10/07/18 1008 10/07/18 1030  BP: (!) 114/38 110/60  Pulse: 92 94  Resp: 14 15  Temp: (!) 36.3 C (!) 36.3 C  SpO2:      Last Pain:  Vitals:   10/07/18 1030  TempSrc: Temporal  PainSc: 0-No pain                 Tabrina Esty A.

## 2018-10-07 NOTE — H&P (Deleted)
Brittany [name] has presented today for surgery, with the diagnosis of atrial fibrillation.  The various methods of treatment have been discussed with the patient and family. After consideration of risks, benefits and other options for treatment, the patient has consented to  Procedure(s): Catheter ablation as a surgical intervention .  Risks include but not limited to bleeding, tamponade, heart block, stroke, damage to surrounding organs, among others. The patient's history has been reviewed, patient examined, no change in status, stable for surgery.  I have reviewed the patient's chart and labs.  Questions were answered to the patient's satisfaction.    Will Curt Bears, MD 10/07/2018 7:09 AM

## 2018-10-07 NOTE — Discharge Instructions (Signed)
Cardiac Ablation, Care After This sheet gives you information about how to care for yourself after your procedure. Your health care provider may also give you more specific instructions. If you have problems or questions, contact your health care provider. What can I expect after the procedure? After the procedure, it is common to have:  Bruising around your puncture site.  Tenderness around your puncture site.  Skipped heartbeats.  Tiredness (fatigue). Follow these instructions at home: Puncture site care   Follow instructions from your health care provider about how to take care of your puncture site. Make sure you: ? Wash your hands with soap and water before you change your bandage (dressing). If soap and water are not available, use hand sanitizer. ? Change your dressing as told by your health care provider. ? Leave stitches (sutures), skin glue, or adhesive strips in place. These skin closures may need to stay in place for up to 2 weeks. If adhesive strip edges start to loosen and curl up, you may trim the loose edges. Do not remove adhesive strips completely unless your health care provider tells you to do that.  Check your puncture site every day for signs of infection. Check for: ? Redness, swelling, or pain. ? Fluid or blood. If your puncture site starts to bleed, lie down on your back, apply firm pressure to the area, and contact your health care provider. ? Warmth. ? Pus or a bad smell. Driving  Ask your health care provider when it is safe for you to drive again after the procedure.  Do not drive or use heavy machinery while taking prescription pain medicine.  Do not drive for 24 hours if you were given a medicine to help you relax (sedative) during your procedure. Activity  Avoid activities that take a lot of effort for at least 3 days after your procedure.  Do not lift anything that is heavier than 10 lb (4.5 kg), or the limit that you are told, until your health  care provider says that it is safe.  Return to your normal activities as told by your health care provider. Ask your health care provider what activities are safe for you. General instructions  Take over-the-counter and prescription medicines only as told by your health care provider.  Do not use any products that contain nicotine or tobacco, such as cigarettes and e-cigarettes. If you need help quitting, ask your health care provider.  Do not take baths, swim, or use a hot tub until your health care provider approves.  Do not drink alcohol for 24 hours after your procedure.  Keep all follow-up visits as told by your health care provider. This is important. Contact a health care provider if:  You have redness, mild swelling, or pain around your puncture site.  You have fluid or blood coming from your puncture site that stops after applying firm pressure to the area.  Your puncture site feels warm to the touch.  You have pus or a bad smell coming from your puncture site.  You have a fever.  You have chest pain or discomfort that spreads to your neck, jaw, or arm.  You are sweating a lot.  You feel nauseous.  You have a fast or irregular heartbeat.  You have shortness of breath.  You are dizzy or light-headed and feel the need to lie down.  You have pain or numbness in the arm or leg closest to your puncture site. Get help right away if:  Your puncture  site suddenly swells.  Your puncture site is bleeding and the bleeding does not stop after applying firm pressure to the area. These symptoms may represent a serious problem that is an emergency. Do not wait to see if the symptoms will go away. Get medical help right away. Call your local emergency services (911 in the [place] Do not drive yourself to the hospital. Summary  After the procedure, it is normal to have bruising and tenderness at the puncture site in your groin, neck, or forearm.  Check your puncture site every  day for signs of infection.  Get help right away if your puncture site is bleeding and the bleeding does not stop after applying firm pressure to the area. This is a medical emergency. This information is not intended to replace advice given to you by your health care provider. Make sure you discuss any questions you have with your health care provider. Document Released: 05/28/2016 Document Revised: 01/29/2017 Document Reviewed: 05/28/2016 Elsevier Patient Education  2020 [employer].

## 2018-10-07 NOTE — Anesthesia Procedure Notes (Signed)
Procedure Name: Intubation Date/Time: 10/07/2018 7:46 AM Performed by: Kyung Rudd, CRNA Pre-anesthesia Checklist: Patient identified, Emergency Drugs available, Suction available, Patient being monitored and Timeout performed Patient Re-evaluated:Patient Re-evaluated prior to induction Oxygen Delivery Method: Circle system utilized Preoxygenation: Pre-oxygenation with 100% oxygen Induction Type: IV induction Ventilation: Mask ventilation without difficulty Laryngoscope Size: Mac and 3 Grade View: Grade I Tube type: Oral Tube size: 7.0 mm Number of attempts: 1 Airway Equipment and Method: Stylet Placement Confirmation: ETT inserted through vocal cords under direct vision,  positive ETCO2 and breath sounds checked- equal and bilateral Secured at: 21 cm Tube secured with: Tape Dental Injury: Teeth and Oropharynx as per pre-operative assessment

## 2018-10-07 NOTE — Progress Notes (Signed)
    2 X 9 Fr. R F/V, and  7 Fr.11 Fr. L F/V sheaths were pulled manually, Pressure was held for 2X10 min. Sterile gauze was applied at the site. Both groins are soft and non tender. R and L DP were palpable before and after the sheath pull.    bed rest started at 1115 X 6 hr. Instructions were given to patient about the bed rest.  HR 89 SR BP 103/58 sPO2 97% on R/A

## 2018-10-10 ENCOUNTER — Encounter (HOSPITAL_COMMUNITY): Payer: Self-pay | Admitting: Cardiology

## 2018-10-25 DIAGNOSIS — G4733 Obstructive sleep apnea (adult) (pediatric): Secondary | ICD-10-CM | POA: Diagnosis not present

## 2018-11-08 ENCOUNTER — Other Ambulatory Visit: Payer: Self-pay

## 2018-11-08 ENCOUNTER — Encounter (HOSPITAL_COMMUNITY): Payer: Self-pay | Admitting: Physician Assistant

## 2018-11-08 ENCOUNTER — Ambulatory Visit (HOSPITAL_COMMUNITY)
Admission: RE | Admit: 2018-11-08 | Discharge: 2018-11-08 | Disposition: A | Discharge status: Home / Self Care | Payer: PPO | Source: Ambulatory Visit | Attending: Physician Assistant | Admitting: Physician Assistant

## 2018-11-08 VITALS — BP 122/82 | HR 81 | Ht 67.0 in | Wt 125.2 lb

## 2018-11-08 DIAGNOSIS — G4733 Obstructive sleep apnea (adult) (pediatric): Secondary | ICD-10-CM | POA: Diagnosis not present

## 2018-11-08 DIAGNOSIS — Z7901 Long term (current) use of anticoagulants: Secondary | ICD-10-CM | POA: Insufficient documentation

## 2018-11-08 DIAGNOSIS — Z79899 Other long term (current) drug therapy: Secondary | ICD-10-CM | POA: Diagnosis not present

## 2018-11-08 DIAGNOSIS — Z853 Personal history of malignant neoplasm of breast: Secondary | ICD-10-CM | POA: Diagnosis not present

## 2018-11-08 DIAGNOSIS — K219 Gastro-esophageal reflux disease without esophagitis: Secondary | ICD-10-CM | POA: Insufficient documentation

## 2018-11-08 DIAGNOSIS — Z9221 Personal history of antineoplastic chemotherapy: Secondary | ICD-10-CM | POA: Insufficient documentation

## 2018-11-08 DIAGNOSIS — Z87891 Personal history of nicotine dependence: Secondary | ICD-10-CM | POA: Insufficient documentation

## 2018-11-08 DIAGNOSIS — E785 Hyperlipidemia, unspecified: Secondary | ICD-10-CM | POA: Insufficient documentation

## 2018-11-08 DIAGNOSIS — Z8249 Family history of ischemic heart disease and other diseases of the circulatory system: Secondary | ICD-10-CM | POA: Insufficient documentation

## 2018-11-08 DIAGNOSIS — Z803 Family history of malignant neoplasm of breast: Secondary | ICD-10-CM | POA: Diagnosis not present

## 2018-11-08 DIAGNOSIS — Z923 Personal history of irradiation: Secondary | ICD-10-CM | POA: Diagnosis not present

## 2018-11-08 DIAGNOSIS — I480 Paroxysmal atrial fibrillation: Secondary | ICD-10-CM | POA: Diagnosis not present

## 2018-11-08 DIAGNOSIS — D649 Anemia, unspecified: Secondary | ICD-10-CM | POA: Insufficient documentation

## 2018-11-08 DIAGNOSIS — M8580 Other specified disorders of bone density and structure, unspecified site: Secondary | ICD-10-CM | POA: Insufficient documentation

## 2018-11-08 DIAGNOSIS — Z86010 Personal history of colonic polyps: Secondary | ICD-10-CM | POA: Insufficient documentation

## 2018-11-08 DIAGNOSIS — Z888 Allergy status to other drugs, medicaments and biological substances status: Secondary | ICD-10-CM | POA: Insufficient documentation

## 2018-11-08 MED ORDER — METOPROLOL TARTRATE 25 MG PO TABS: 25.0000 mg | ORAL_TABLET | Freq: Two times a day (BID) | ORAL | 3 refills | Status: DC

## 2018-11-08 NOTE — Patient Instructions (Signed)
Metoprolol to 25mg  twice a day

## 2018-11-08 NOTE — Progress Notes (Signed)
Primary Care Physician: Cassandria Anger, MD Primary Cardiologist: Dr Marlou Porch Primary Electrophysiologist: Dr Curt Bears Referring Physician: Dr Dorcas Carrow is a 73 y.o. female with a history of atrial fibrillation, breast cancer status post lumpectomy, chemotherapy, XRT, OSA who presents for follow up in the [facility].  Patient is s/p afib ablation with Dr Curt Bears 10/07/18. Patient reports that she has felt well since the ablation but was concerned that her Apple Watch had shown episodes of "inconclusive" rhythm. No CP, swallowing, or groin issues.  Today, she denies symptoms of palpitations, chest pain, shortness of breath, orthopnea, PND, lower extremity edema, dizziness, presyncope, syncope, snoring, daytime somnolence, bleeding, or neurologic sequela. The patient is tolerating medications without difficulties and is otherwise without complaint today.    Atrial Fibrillation Risk Factors:  she does have symptoms or diagnosis of sleep apnea. she is compliant with CPAP therapy.   she has a BMI of Body mass index is 19.61 kg/m.[name] Filed Weights   11/08/18 1003  Weight: 56.8 kg    Family History  Problem Relation Age of Onset  . Heart disease Father   . Cancer Father        lung  . Cancer Sister 36       Breast & Colorectal  . Epilepsy Mother   . Dementia Mother      Atrial Fibrillation Management history:  Previous antiarrhythmic drugs: none Previous cardioversions: none Previous ablations: 10/07/18 CHADS2VASC score: 2 Anticoagulation history: Eliquis   Past Medical History:  Diagnosis Date  . A-fib ([place])   . Acid reflux   . Anemia   . Arthritis   . Breast cancer ([place]) 11/23/2007   right, ER/PR -, HER 2 -  . Cancer [facility])    breast right  triple recet neg, lumpect, chemo, XRT  . Esophageal stricture   . Gastritis 10/2007  . History of chemotherapy    neoadjuvant chemo, last dose 03/2008  . History of radiation therapy  07/25/08 -09/11/08   right breast  . Hx of colonic polyps 08/11/2016  . Hyperlipidemia   . Migraines    rare  . Osteopenia    Past Surgical History:  Procedure Laterality Date  . ATRIAL FIBRILLATION ABLATION N/A 10/07/2018   Procedure: ATRIAL FIBRILLATION ABLATION;  Surgeon: [name], MD;  Location: [facility];  Service: Cardiovascular;  Laterality: N/A;  . BREAST LUMPECTOMY  06/26/2008   right, CA   XRT, chemo  . COLONOSCOPY    . EYE SURGERY     left eye cataract surgery 12/23/2015  . growth removed per right thigh    . JOINT REPLACEMENT  12/31/2015   RTK  . QUADRICEPS TENDON REPAIR Right 09/14/2016   Procedure: OPEN REPAIR QUADRICEP TENDON;  Surgeon: [name], MD;  Location: [facility];  Service: Orthopedics;  Laterality: Right;  . TOTAL KNEE ARTHROPLASTY Right 12/31/2015   Procedure: RIGHT TOTAL KNEE ARTHROPLASTY;  Surgeon: [name], MD;  Location: [facility];  Service: Orthopedics;  Laterality: Right;  . UPPER GASTROINTESTINAL ENDOSCOPY      Current Outpatient Medications  Medication Sig Dispense Refill  . apixaban (ELIQUIS) 5 MG TABS tablet Take 1 tablet (5 mg total) by mouth 2 (two) times daily. 60 tablet 3  . Biotin 5000 MCG TABS Take 5,000 mcg by mouth daily.    . Cholecalciferol (VITAMIN D) 50 MCG (2000 UT) tablet Take 2,000 Units by mouth daily.    . citalopram (CELEXA) 40 MG  tablet TAKE 1 TABLET BY MOUTH EVERY DAY (Patient taking differently: Take 40 mg by mouth daily. ) 90 tablet 0  . denosumab (PROLIA) 60 MG/ML SOLN injection Inject 60 mg into the skin once. Administer in upper arm, thigh, or abdomen (Patient taking differently: Inject 60 mg into the skin every 6 (six) months. Administer in upper arm, thigh, or abdomen every 6 months.) 1.8 mL 1  . famotidine (PEPCID) 40 MG tablet TAKE 1 TABLET BY MOUTH EVERY DAY (Patient taking differently: Take 40 mg by mouth daily. ) 90 tablet 0  . levocetirizine (XYZAL) 5 MG tablet Take 5 mg by mouth daily as needed  for allergies.     . Omega-3 Fatty Acids (FISH OIL) 1200 MG CAPS Take 1,200 mg by mouth daily.     . Probiotic Product (PROBIOTIC DAILY PO) Take 1 tablet by mouth daily.    . SUMAtriptan (IMITREX) 100 MG tablet TAKE 1 TABLET BY MOUTH ONCE FOR 1 DOSE. MAY REPEAT IN 2 HOURS IF HEADACHE PERSISTS / RECURS (Patient taking differently: Take 100 mg by mouth See admin instructions. Take 1 tablet by mouth once as needed for headache. May repeat in 2 hours if needed.) 9 tablet 5  . VALERIAN ROOT PO Take 1 capsule by mouth at bedtime.    . vitamin B-12 (CYANOCOBALAMIN) 1000 MCG tablet Take 1,000 mcg by mouth daily.    [name] zolpidem (AMBIEN) 10 MG tablet TAKE 1/2 TO 1 TABLET BY MOUTH AT BEDTIME AS NEEDED FOR SLEEP (Patient taking differently: Take 5 mg by mouth at bedtime as needed for sleep. ) 90 tablet 0   No current facility-administered medications for this encounter.     Allergies  Allergen Reactions  . Boniva [Ibandronic Acid] Nausea Only  . Lipitor [Atorvastatin] Other (See Comments)    Muscle and joint pain(s)    Social History   Socioeconomic History  . Marital status: Married    Spouse name: Not on file  . Number of children: 3  . Years of education: Not on file  . Highest education level: Not on file  Occupational History    Employer: RETIRED  Social Needs  . Financial resource strain: Not hard at all  . Food insecurity    Worry: Never true    Inability: Never true  . Transportation needs    Medical: No    Non-medical: No  Tobacco Use  . Smoking status: Former Smoker    Packs/day: 0.25    Years: 5.00    Pack years: 1.25    Types: Cigarettes    Quit date: 03/03/1967    Years since quitting: 51.7  . Smokeless tobacco: Never Used  . Tobacco comment: 6 cig per day when she was smoking/quit years ago  Substance and Sexual Activity  . Alcohol use: Yes    Alcohol/week: 7.0 standard drinks    Types: 7 Cans of beer per week    Comment: 1 beer per day   . Drug use: No  . Sexual  activity: Yes    Partners: Male    Birth control/protection: Post-menopausal  Lifestyle  . Physical activity    Days per week: 3 days    Minutes per session: 60 min  . Stress: Not at all  Relationships  . Social connections    Talks on phone: More than three times a week    Gets together: More than three times a week    Attends religious service: 1 to 4 times per year  Active member of club or organization: Yes    Attends meetings of clubs or organizations: More than 4 times per year    Relationship status: Married  . Intimate partner violence    Fear of current or ex partner: No    Emotionally abused: No    Physically abused: No    Forced sexual activity: No  Other Topics Concern  . Not on file  Social History Narrative   G3, P3, 1st pregnancy age 34, menopause age 58, no HRT     ROS- All systems are reviewed and negative except as per the HPI above.  Physical Exam: Vitals:   11/08/18 1003  BP: 122/82  Pulse: 81  Weight: 56.8 kg  Height: 5\' 7"  (1.702 m)    GEN- The patient is well appearing, alert and oriented x 3 today.   Head- normocephalic, atraumatic Eyes-  Sclera clear, conjunctiva pink Ears- hearing intact Oropharynx- clear Neck- supple  Lungs- Clear to ausculation bilaterally, normal work of breathing Heart- Regular rate and rhythm, occasional ectopic beat heard, no murmurs, rubs or gallops  GI- soft, NT, ND, + BS Extremities- no clubbing, cyanosis, or edema MS- no significant deformity or atrophy Skin- no rash or lesion Psych- euthymic mood, full affect Neuro- strength and sensation are intact  Wt Readings from Last 3 Encounters:  11/08/18 56.8 kg  10/07/18 55.3 kg  08/18/18 56.1 kg    EKG today demonstrates SR HR 81, PACs, LVH, PR 140, QRS 96, QTc 485  Echo 12/30/18 demonstrated  - Left ventricle: The cavity size was normal. Vandegrift thickness was   normal. Systolic function was normal. The estimated ejection   fraction was in the range of 55%  to 60%. Left ventricular   diastolic function parameters were normal. - Aortic valve: Sclerosis without stenosis. - Mitral valve: Leaflets are severely thickened and calcified with   posterior leaflet prolapse but only trivial MR. - Atrial septum: A patent foramen ovale cannot be excluded.  Epic records are reviewed at length today  Assessment and Plan:  1. Paroxysmal atrial fibrillation Appears to be maintaining SR. Apple Watch ECG strips reviewed with patient. Strips shows SR with frequent PACs. Reassured patient that some afib is normal after ablation. Increase metoprolol to 25 mg BID. Continue Eliquis 5 mg BID for at least 3 months post ablation.   This patients CHA2DS2-VASc Score and unadjusted Ischemic Stroke Rate (% per year) is equal to 2.2 % stroke rate/year from a score of 2  Above score calculated as 1 point each if present [CHF, HTN, DM, Vascular=MI/PAD/Aortic Plaque, Age if 65-74, or Female] Above score calculated as 2 points each if present [Age > 75, or Stroke/TIA/TE]   2. Obstructive sleep apnea The importance of adequate treatment of sleep apnea was discussed today in order to improve our ability to maintain sinus rhythm long term. Encouraged compliance with CPAP therapy.   Follow up with Dr Curt Bears as scheduled.   [facility] [address] [place], [place] [address] [phone] 11/08/2018 10:24 AM

## 2018-11-23 DIAGNOSIS — Z961 Presence of intraocular lens: Secondary | ICD-10-CM | POA: Diagnosis not present

## 2018-11-23 DIAGNOSIS — H26493 Other secondary cataract, bilateral: Secondary | ICD-10-CM | POA: Diagnosis not present

## 2018-11-25 DIAGNOSIS — G4733 Obstructive sleep apnea (adult) (pediatric): Secondary | ICD-10-CM | POA: Diagnosis not present

## 2018-12-25 ENCOUNTER — Other Ambulatory Visit: Payer: Self-pay | Admitting: Internal Medicine

## 2018-12-25 DIAGNOSIS — G4733 Obstructive sleep apnea (adult) (pediatric): Secondary | ICD-10-CM | POA: Diagnosis not present

## 2019-01-09 DIAGNOSIS — H40033 Anatomical narrow angle, bilateral: Secondary | ICD-10-CM | POA: Diagnosis not present

## 2019-01-09 DIAGNOSIS — H43393 Other vitreous opacities, bilateral: Secondary | ICD-10-CM | POA: Diagnosis not present

## 2019-01-12 ENCOUNTER — Other Ambulatory Visit: Payer: Self-pay

## 2019-01-12 ENCOUNTER — Encounter: Payer: Self-pay | Admitting: Cardiology

## 2019-01-12 ENCOUNTER — Ambulatory Visit: Payer: PPO | Admitting: Cardiology

## 2019-01-12 VITALS — BP 112/70 | HR 75 | Ht 67.0 in | Wt 127.6 lb

## 2019-01-12 DIAGNOSIS — I480 Paroxysmal atrial fibrillation: Secondary | ICD-10-CM | POA: Diagnosis not present

## 2019-01-12 NOTE — Patient Instructions (Signed)
Medication Instructions:  Your physician has recommended you make the following change in your medication:  1. STOP Metoprolol  * If you need a refill on your cardiac medications before your next appointment, please call your pharmacy.   Labwork: None ordered  Testing/Procedures: None ordered  Follow-Up: At [facility], you and your health needs are our priority.  As part of our continuing mission to provide you with exceptional heart care, we have created designated Provider Care Teams.  These Care Teams include your primary Cardiologist (physician) and Advanced Practice Providers (APPs -  Physician Assistants and Nurse Practitioners) who all work together to provide you with the care you need, when you need it.  You will need a follow up appointment in 3 months.  Please call our office 2 months in advance to schedule this appointment.  You may see Dr Curt Bears or one of the following Advanced Practice Providers on your designated Care Team:    Chanetta Marshall, NP  Tommye Standard, PA-C  [place], [place]  Thank you for choosing [facility]!!   Trinidad Curet, RN [phone]

## 2019-01-12 NOTE — Addendum Note (Signed)
Addended by: [name] on: 01/12/2019 11:29 AM   Modules accepted: Orders

## 2019-01-12 NOTE — Progress Notes (Signed)
Electrophysiology Office Note   Date:  01/12/2019   ID:  Brittany [name], DOB 02/20/1946, MRN [id number]  PCP:  Tresa Garter, MD  Cardiologist:  Anne Fu Primary Electrophysiologist:  Layni Kreamer Jorja Loa, MD    No chief complaint on file.    History of Present Illness: Brittany [name] is a 73 y.o. female who is being seen today for the evaluation of atrial fibrillation at the request of Plotnikov, Georgina Quint, MD. Presenting today for electrophysiology evaluation.  She has a history of atrial fibrillation, breast cancer status post lumpectomy, chemo and XRT.  She wore a cardiac monitor that showed a brief episode of atrial fibrillation.  She has had palpitations for many years.  She did have a treadmill test that showed a brief episode of SVT for which she was asymptomatic.  She went to the emergency room 04/18/2018 with rapid atrial fibrillation rates 175.  She had palpitations with racing and pounding.  She does state that she has had an abnormal rhythm for multiple years, but only has just been diagnosed with atrial fibrillation.  She is now status post day of ablation 10/07/2018.  Today, denies symptoms of palpitations, chest pain, shortness of breath, orthopnea, PND, lower extremity edema, claudication, dizziness, presyncope, syncope, bleeding, or neurologic sequela. The patient is tolerating medications without difficulties.  Been feeling well since her ablation.  She has noted no further episodes of atrial fibrillation.  Her apple watch has shown intermittent irregular rhythms that appear to be due to PACs.   Past Medical History:  Diagnosis Date  . A-fib (HCC)   . Acid reflux   . Anemia   . Arthritis   . Breast cancer (HCC) 11/23/2007   right, ER/PR -, HER 2 -  . Cancer [facility])    breast right  triple recet neg, lumpect, chemo, XRT  . Esophageal stricture   . Gastritis 10/2007  . History of chemotherapy    neoadjuvant chemo, last dose 03/2008  . History of radiation therapy 07/25/08  -09/11/08   right breast  . Hx of colonic polyps 08/11/2016  . Hyperlipidemia   . Migraines    rare  . Osteopenia    Past Surgical History:  Procedure Laterality Date  . ATRIAL FIBRILLATION ABLATION N/A 10/07/2018   Procedure: ATRIAL FIBRILLATION ABLATION;  Surgeon: Regan Lemming, MD;  Location: [facility] INVASIVE CV LAB;  Service: Cardiovascular;  Laterality: N/A;  . BREAST LUMPECTOMY  06/26/2008   right, CA   XRT, chemo  . COLONOSCOPY    . EYE SURGERY     left eye cataract surgery 12/23/2015  . growth removed per right thigh    . JOINT REPLACEMENT  12/31/2015   RTK  . QUADRICEPS TENDON REPAIR Right 09/14/2016   Procedure: OPEN REPAIR QUADRICEP TENDON;  Surgeon: Durene Romans, MD;  Location: [facility] ORS;  Service: Orthopedics;  Laterality: Right;  . TOTAL KNEE ARTHROPLASTY Right 12/31/2015   Procedure: RIGHT TOTAL KNEE ARTHROPLASTY;  Surgeon: Durene Romans, MD;  Location: [facility] ORS;  Service: Orthopedics;  Laterality: Right;  . UPPER GASTROINTESTINAL ENDOSCOPY       Current Outpatient Medications  Medication Sig Dispense Refill  . apixaban (ELIQUIS) 5 MG TABS tablet Take 1 tablet (5 mg total) by mouth 2 (two) times daily. 60 tablet 3  . Biotin 5000 MCG TABS Take 5,000 mcg by mouth daily.    . Cholecalciferol (VITAMIN D) 50 MCG (2000 UT) tablet Take 2,000 Units by mouth daily.    . citalopram (CELEXA)  40 MG tablet TAKE 1 TABLET BY MOUTH EVERY DAY 90 tablet 3  . denosumab (PROLIA) 60 MG/ML SOLN injection Inject 60 mg into the skin once. Administer in upper arm, thigh, or abdomen (Patient taking differently: Inject 60 mg into the skin every 6 (six) months. Administer in upper arm, thigh, or abdomen every 6 months.) 1.8 mL 1  . famotidine (PEPCID) 40 MG tablet TAKE 1 TABLET BY MOUTH EVERY DAY (Patient taking differently: Take 40 mg by mouth daily. ) 90 tablet 0  . levocetirizine (XYZAL) 5 MG tablet Take 5 mg by mouth daily as needed for allergies.     . metoprolol tartrate (LOPRESSOR) 25 MG tablet  Take 1 tablet (25 mg total) by mouth 2 (two) times daily. 180 tablet 3  . Omega-3 Fatty Acids (FISH OIL) 1200 MG CAPS Take 1,200 mg by mouth daily.     . Probiotic Product (PROBIOTIC DAILY PO) Take 1 tablet by mouth daily.    . SUMAtriptan (IMITREX) 100 MG tablet TAKE 1 TABLET BY MOUTH ONCE FOR 1 DOSE. MAY REPEAT IN 2 HOURS IF HEADACHE PERSISTS / RECURS (Patient taking differently: Take 100 mg by mouth See admin instructions. Take 1 tablet by mouth once as needed for headache. May repeat in 2 hours if needed.) 9 tablet 5  . VALERIAN ROOT PO Take 1 capsule by mouth at bedtime.    . vitamin B-12 (CYANOCOBALAMIN) 1000 MCG tablet Take 1,000 mcg by mouth daily.    [name] zolpidem (AMBIEN) 10 MG tablet TAKE 1/2 TO 1 TABLET BY MOUTH AT BEDTIME AS NEEDED FOR SLEEP (Patient taking differently: Take 5 mg by mouth at bedtime as needed for sleep. ) 90 tablet 0   No current facility-administered medications for this visit.     Allergies:   Boniva [ibandronic acid] and Lipitor [atorvastatin]   Social History:  The patient  reports that she quit smoking about 51 years ago. Her smoking use included cigarettes. She has a 1.25 pack-year smoking history. She has never used smokeless tobacco. She reports current alcohol use of about 7.0 standard drinks of alcohol per week. She reports that she does not use drugs.   Family History:  The patient's family history includes Cancer in her father; Cancer (age of onset: 23) in her sister; Dementia in her mother; Epilepsy in her mother; Heart disease in her father.   ROS:  Please see the history of present illness.   Otherwise, review of systems is positive for none.   All other systems are reviewed and negative.   PHYSICAL EXAM: VS:  BP 112/70   Pulse 75   Ht 5\' 7"  (1.702 m)   Wt 127 lb 9.6 oz (57.9 kg)   LMP 03/02/1994 (Approximate)   SpO2 97%   BMI 19.98 kg/m  , BMI Body mass index is 19.98 kg/m. GEN: Well nourished, well developed, in no acute distress  HEENT:  normal  Neck: no JVD, carotid bruits, or masses Cardiac: RRR; no murmurs, rubs, or gallops,no edema  Respiratory:  clear to auscultation bilaterally, normal work of breathing GI: soft, nontender, nondistended, + BS MS: no deformity or atrophy  Skin: warm and dry Neuro:  Strength and sensation are intact Psych: euthymic mood, full affect  EKG:  EKG is ordered today. Personal review of the ekg ordered shows sinus rhythm, PACs, rate 75  Recent Labs: 08/18/2018: ALT 15 10/07/2018: BUN 13; Creatinine, Ser 0.66; Hemoglobin 13.9; Platelets 286; Potassium 3.9; Sodium 136    Lipid Panel  Component Value Date/Time   CHOL 163 07/31/2015 1153   TRIG 70.0 07/31/2015 1153   HDL 63.30 07/31/2015 1153   CHOLHDL 3 07/31/2015 1153   VLDL 14.0 07/31/2015 1153   LDLCALC 85 07/31/2015 1153   LDLDIRECT 178.9 03/23/2013 1059     Wt Readings from Last 3 Encounters:  01/12/19 127 lb 9.6 oz (57.9 kg)  11/08/18 125 lb 3.2 oz (56.8 kg)  10/07/18 122 lb (55.3 kg)      Other studies Reviewed: Additional studies/ records that were reviewed today include: TTE 12/29/17  Review of the above records today demonstrates:  - Left ventricle: The cavity size was normal. Stoneking thickness was   normal. Systolic function was normal. The estimated ejection   fraction was in the range of 55% to 60%. Left ventricular   diastolic function parameters were normal. - Aortic valve: Sclerosis without stenosis. - Mitral valve: Leaflets are severely thickened and calcified with   posterior leaflet prolapse but only trivial MR. - Atrial septum: A patent foramen ovale cannot be excluded. - Left atrium:  The atrium was normal in size.  30 day monitor 01/24/18 - personally reviewed  Brief episode of paroxysmal atrial fibrillation noted with maximum heart rate 163 bpm. Episode lasted approximately 2 minutes.  Occasional ventricular couplets. Occasional PVCs  Slow ventricular run of approximately 79 bpm, 7 beats.  Asymptomatic.  Brief 10 beats of paroxysmal atrial tachycardia   ASSESSMENT AND PLAN:  1.  Paroxysmal atrial fibrillation: Currently on Eliquis.  Status post AF ablation 10/07/2018.  We Desta Bujak plan to stop her metoprolol today.  We Shulamis Wenberg see her back in 3 months.  She has had some sinus rhythm with PACs.  No obvious atrial fibrillation by her apple watch.  This patients CHA2DS2-VASc Score and unadjusted Ischemic Stroke Rate (% per year) is equal to 2.2 % stroke rate/year from a score of 2  Above score calculated as 1 point each if present [CHF, HTN, DM, Vascular=MI/PAD/Aortic Plaque, Age if 65-74, or Female] Above score calculated as 2 points each if present [Age > 75, or Stroke/TIA/TE]   2.  Obstructive sleep apnea: CPAP compliance encouraged   Current medicines are reviewed at length with the patient today.   The patient does not have concerns regarding her medicines.  The following changes were made today: Stop metoprolol  Labs/ tests ordered today include:  Orders Placed This Encounter  Procedures  . EKG 12-Lead    Disposition:   FU with Tanishka Drolet 3 months  Signed, Monico Sudduth Jorja Loa, MD  01/12/2019 11:11 AM     [facility] [address] [place] [address] [phone] (office) [phone] (fax)

## 2019-01-13 DIAGNOSIS — G4733 Obstructive sleep apnea (adult) (pediatric): Secondary | ICD-10-CM | POA: Diagnosis not present

## 2019-01-17 ENCOUNTER — Other Ambulatory Visit: Payer: Self-pay

## 2019-01-17 ENCOUNTER — Ambulatory Visit (INDEPENDENT_AMBULATORY_CARE_PROVIDER_SITE_OTHER): Payer: PPO | Admitting: Internal Medicine

## 2019-01-17 ENCOUNTER — Encounter: Payer: Self-pay | Admitting: Internal Medicine

## 2019-01-17 VITALS — BP 118/74 | HR 95 | Temp 98.6°F | Ht 67.0 in | Wt 126.0 lb

## 2019-01-17 DIAGNOSIS — N3000 Acute cystitis without hematuria: Secondary | ICD-10-CM

## 2019-01-17 DIAGNOSIS — I480 Paroxysmal atrial fibrillation: Secondary | ICD-10-CM | POA: Diagnosis not present

## 2019-01-17 DIAGNOSIS — R350 Frequency of micturition: Secondary | ICD-10-CM | POA: Diagnosis not present

## 2019-01-17 LAB — POC URINALSYSI DIPSTICK (AUTOMATED)
Bilirubin, UA: NEGATIVE
Blood, UA: NEGATIVE
Glucose, UA: NEGATIVE
Ketones, UA: NEGATIVE
Nitrite, UA: NEGATIVE
Protein, UA: NEGATIVE
Spec Grav, UA: 1.01 (ref 1.010–1.025)
Urobilinogen, UA: NEGATIVE E.U./dL — AB
pH, UA: 7 (ref 5.0–8.0)

## 2019-01-17 MED ORDER — CEFUROXIME AXETIL 250 MG PO TABS: 250.0000 mg | ORAL_TABLET | Freq: Two times a day (BID) | ORAL | 0 refills | Status: DC

## 2019-01-17 NOTE — Addendum Note (Signed)
Addended by: Karren Cobble on: 01/17/2019 10:37 AM   Modules accepted: Orders

## 2019-01-17 NOTE — Assessment & Plan Note (Signed)
NSR w/ PACs

## 2019-01-17 NOTE — Assessment & Plan Note (Signed)
11:20

## 2019-01-17 NOTE — Progress Notes (Signed)
Subjective:  Patient ID: Brittany [name], female    DOB: 07-Sep-1945  Age: 73 y.o. MRN: [id number]  CC: No chief complaint on file.   HPI Brittany [name] presents for UTI sx's x 2 d  Outpatient Medications Prior to Visit  Medication Sig Dispense Refill  . apixaban (ELIQUIS) 5 MG TABS tablet Take 1 tablet (5 mg total) by mouth 2 (two) times daily. 60 tablet 3  . Biotin 5000 MCG TABS Take 5,000 mcg by mouth daily.    . Cholecalciferol (VITAMIN D) 50 MCG (2000 UT) tablet Take 2,000 Units by mouth daily.    . citalopram (CELEXA) 40 MG tablet TAKE 1 TABLET BY MOUTH EVERY DAY 90 tablet 3  . denosumab (PROLIA) 60 MG/ML SOLN injection Inject 60 mg into the skin once. Administer in upper arm, thigh, or abdomen (Patient taking differently: Inject 60 mg into the skin every 6 (six) months. Administer in upper arm, thigh, or abdomen every 6 months.) 1.8 mL 1  . famotidine (PEPCID) 40 MG tablet TAKE 1 TABLET BY MOUTH EVERY DAY (Patient taking differently: Take 40 mg by mouth daily. ) 90 tablet 0  . levocetirizine (XYZAL) 5 MG tablet Take 5 mg by mouth daily as needed for allergies.     . Omega-3 Fatty Acids (FISH OIL) 1200 MG CAPS Take 1,200 mg by mouth daily.     . Probiotic Product (PROBIOTIC DAILY PO) Take 1 tablet by mouth daily.    . SUMAtriptan (IMITREX) 100 MG tablet TAKE 1 TABLET BY MOUTH ONCE FOR 1 DOSE. MAY REPEAT IN 2 HOURS IF HEADACHE PERSISTS / RECURS (Patient taking differently: Take 100 mg by mouth See admin instructions. Take 1 tablet by mouth once as needed for headache. May repeat in 2 hours if needed.) 9 tablet 5  . VALERIAN ROOT PO Take 1 capsule by mouth at bedtime.    . vitamin B-12 (CYANOCOBALAMIN) 1000 MCG tablet Take 1,000 mcg by mouth daily.    [name] zolpidem (AMBIEN) 10 MG tablet TAKE 1/2 TO 1 TABLET BY MOUTH AT BEDTIME AS NEEDED FOR SLEEP (Patient taking differently: Take 5 mg by mouth at bedtime as needed for sleep. ) 90 tablet 0   No facility-administered medications prior to visit.      ROS: Review of Systems  Constitutional: Negative for activity change, appetite change, chills, fatigue and unexpected weight change.  HENT: Negative for congestion, mouth sores and sinus pressure.   Eyes: Negative for visual disturbance.  Respiratory: Negative for cough and chest tightness.   Gastrointestinal: Negative for abdominal pain and nausea.  Genitourinary: Positive for dysuria and frequency. Negative for difficulty urinating and vaginal pain.  Musculoskeletal: Negative for back pain and gait problem.  Skin: Negative for pallor and rash.  Neurological: Negative for dizziness, tremors, weakness, numbness and headaches.  Psychiatric/Behavioral: Negative for confusion and sleep disturbance.    Objective:  BP 118/74 (BP Location: Left Arm, Patient Position: Sitting, Cuff Size: Normal)   Pulse 95   Temp 98.6 F (37 C) (Oral)   Ht 5\' 7"  (1.702 m)   Wt 126 lb (57.2 kg)   LMP 03/02/1994 (Approximate)   SpO2 97%   BMI 19.73 kg/m   BP Readings from Last 3 Encounters:  01/17/19 118/74  01/12/19 112/70  11/08/18 122/82    Wt Readings from Last 3 Encounters:  01/17/19 126 lb (57.2 kg)  01/12/19 127 lb 9.6 oz (57.9 kg)  11/08/18 125 lb 3.2 oz (56.8 kg)    Physical Exam  Constitutional:      General: She is not in acute distress.    Appearance: She is well-developed.  HENT:     Head: Normocephalic.     Right Ear: External ear normal.     Left Ear: External ear normal.     Nose: Nose normal.  Eyes:     General:        Right eye: No discharge.        Left eye: No discharge.     Conjunctiva/sclera: Conjunctivae normal.     Pupils: Pupils are equal, round, and reactive to light.  Neck:     Musculoskeletal: Normal range of motion and neck supple.     Thyroid: No thyromegaly.     Vascular: No JVD.     Trachea: No tracheal deviation.  Cardiovascular:     Rate and Rhythm: Normal rate and regular rhythm.     Heart sounds: Normal heart sounds.  Pulmonary:     Effort:  No respiratory distress.     Breath sounds: No stridor. No wheezing.  Abdominal:     General: Bowel sounds are normal. There is no distension.     Palpations: Abdomen is soft. There is no mass.     Tenderness: There is no abdominal tenderness. There is no guarding or rebound.  Musculoskeletal:        General: No tenderness.  Lymphadenopathy:     Cervical: No cervical adenopathy.  Skin:    Findings: No erythema or rash.  Neurological:     Cranial Nerves: No cranial nerve deficit.     Motor: No abnormal muscle tone.     Coordination: Coordination normal.     Deep Tendon Reflexes: Reflexes normal.  Psychiatric:        Behavior: Behavior normal.        Thought Content: Thought content normal.        Judgment: Judgment normal.   irreg beats - ?PACs  Lab Results  Component Value Date   WBC 5.6 10/07/2018   HGB 13.9 10/07/2018   HCT 42.3 10/07/2018   PLT 286 10/07/2018   GLUCOSE 95 10/07/2018   CHOL 163 07/31/2015   TRIG 70.0 07/31/2015   HDL 63.30 07/31/2015   LDLDIRECT 178.9 03/23/2013   LDLCALC 85 07/31/2015   ALT 15 08/18/2018   AST 20 08/18/2018   NA 136 10/07/2018   K 3.9 10/07/2018   CL 101 10/07/2018   CREATININE 0.66 10/07/2018   BUN 13 10/07/2018   CO2 25 10/07/2018   TSH 1.37 10/13/2017   INR 1.03 04/18/2018    No results found.  Assessment & Plan:   There are no diagnoses linked to this encounter.   No orders of the defined types were placed in this encounter.    Follow-up: No follow-ups on file.  Walker Kehr, MD

## 2019-01-23 ENCOUNTER — Telehealth: Payer: Self-pay | Admitting: *Deleted

## 2019-01-23 DIAGNOSIS — R300 Dysuria: Secondary | ICD-10-CM

## 2019-01-23 DIAGNOSIS — R350 Frequency of micturition: Secondary | ICD-10-CM

## 2019-01-23 NOTE — Telephone Encounter (Signed)
Copied from [place] [id number]. Topic: Quick Communication - See Telephone Encounter >> Jan 23, 2019 10:11 AM Loma Boston wrote: CRM for notification. See Telephone encounter for: 01/23/19. Pls FU with pt not much better still have freq of every 10 min call back [phone] aapt was on 11/17

## 2019-01-23 NOTE — Telephone Encounter (Signed)
I called pt- she still c/o mild burning and urinary frequency. She wants to know what else she can do. She completed the Ceftin before the weekend. Please advise.

## 2019-01-24 ENCOUNTER — Other Ambulatory Visit (INDEPENDENT_AMBULATORY_CARE_PROVIDER_SITE_OTHER): Payer: PPO

## 2019-01-24 DIAGNOSIS — R350 Frequency of micturition: Secondary | ICD-10-CM | POA: Diagnosis not present

## 2019-01-24 DIAGNOSIS — R300 Dysuria: Secondary | ICD-10-CM

## 2019-01-24 LAB — URINALYSIS, ROUTINE W REFLEX MICROSCOPIC
Bilirubin Urine: NEGATIVE
Hgb urine dipstick: NEGATIVE
Ketones, ur: NEGATIVE
Nitrite: NEGATIVE
Specific Gravity, Urine: 1.01 (ref 1.000–1.030)
Total Protein, Urine: NEGATIVE
Urine Glucose: NEGATIVE
Urobilinogen, UA: 0.2 (ref 0.0–1.0)
pH: 7.5 (ref 5.0–8.0)

## 2019-01-24 NOTE — Telephone Encounter (Signed)
Patient is checking status on specimen sample she gave this morning.  Patient would like to know results. Patient call back [phone]

## 2019-01-24 NOTE — Telephone Encounter (Signed)
Pt informed of below. Orders placed. See labs.

## 2019-01-24 NOTE — Telephone Encounter (Signed)
UA and Cx Use OTC AZO prn after giving a specimen Thx

## 2019-01-25 ENCOUNTER — Other Ambulatory Visit: Payer: Self-pay

## 2019-01-25 DIAGNOSIS — G4733 Obstructive sleep apnea (adult) (pediatric): Secondary | ICD-10-CM | POA: Diagnosis not present

## 2019-01-26 LAB — URINE CULTURE
MICRO NUMBER:: 1134446
SPECIMEN QUALITY:: ADEQUATE

## 2019-01-30 NOTE — Telephone Encounter (Signed)
Pt was notified through [employer]

## 2019-02-01 ENCOUNTER — Other Ambulatory Visit: Payer: Self-pay | Admitting: Internal Medicine

## 2019-02-01 MED ORDER — CEFUROXIME AXETIL 250 MG PO TABS: 250.0000 mg | ORAL_TABLET | Freq: Two times a day (BID) | ORAL | 1 refills | Status: DC

## 2019-02-07 DIAGNOSIS — Z853 Personal history of malignant neoplasm of breast: Secondary | ICD-10-CM | POA: Diagnosis not present

## 2019-02-07 DIAGNOSIS — Z1231 Encounter for screening mammogram for malignant neoplasm of breast: Secondary | ICD-10-CM | POA: Diagnosis not present

## 2019-02-07 LAB — HM MAMMOGRAPHY

## 2019-02-20 ENCOUNTER — Inpatient Hospital Stay: Payer: PPO

## 2019-02-20 ENCOUNTER — Telehealth: Payer: Self-pay | Admitting: Oncology

## 2019-02-20 ENCOUNTER — Other Ambulatory Visit: Payer: Self-pay | Admitting: Internal Medicine

## 2019-02-20 NOTE — Telephone Encounter (Signed)
Returned patient's phone call regarding cancelling 12/21 appointment, per patient' request appointment cancelled. Patient will call back when ready to reschedule.

## 2019-02-21 ENCOUNTER — Other Ambulatory Visit: Payer: Self-pay | Admitting: Internal Medicine

## 2019-02-21 NOTE — Telephone Encounter (Signed)
I will run benefits at the first of the year to confirm prior to scheduling and sending to the pharmacy. Is Dr Alain Marion okay have it done here and following the patient with labs, etc?

## 2019-02-23 ENCOUNTER — Telehealth: Payer: Self-pay | Admitting: Internal Medicine

## 2019-02-23 ENCOUNTER — Other Ambulatory Visit: Payer: Self-pay

## 2019-02-23 MED ORDER — FAMOTIDINE 40 MG PO TABS: 40.0000 mg | ORAL_TABLET | Freq: Every day | ORAL | 0 refills | Status: DC

## 2019-02-23 NOTE — Telephone Encounter (Signed)
Done erx 

## 2019-02-23 NOTE — Telephone Encounter (Signed)
Spoke with Mrs. Wagar she would like to know if her prescription for famotidine 40mg  has been called in to her pharmacy.

## 2019-02-24 DIAGNOSIS — G4733 Obstructive sleep apnea (adult) (pediatric): Secondary | ICD-10-CM | POA: Diagnosis not present

## 2019-03-06 ENCOUNTER — Ambulatory Visit: Payer: PPO

## 2019-03-15 ENCOUNTER — Telehealth: Payer: Self-pay | Admitting: Internal Medicine

## 2019-03-15 NOTE — Telephone Encounter (Signed)
New message    Patient calling requesting advice for UTI.  Patient states she recently finished taking antibiotic for UTI but the burning sensation has returned. Currently taking over the counter AZO. Please advise if patient needs to come into the office to  provide another specimen.  Please advise

## 2019-03-15 NOTE — Telephone Encounter (Signed)
Please advise 

## 2019-03-16 ENCOUNTER — Ambulatory Visit: Payer: PPO | Admitting: Cardiology

## 2019-03-16 NOTE — Telephone Encounter (Signed)
Please schedule office visit or virtual office visit tomorrow (Friday) with any available provider.  This message did not have to be sent to me.  I think we have triage protocols that address what to do with UTI complaints.

## 2019-03-16 NOTE — Telephone Encounter (Signed)
Pt had left a message for Dr Mamie Nick yesterday about herUTI, is there anyway she could get some feedback today? Uncomfortable FU [phone]

## 2019-03-17 NOTE — Telephone Encounter (Signed)
Please call to schedule pt

## 2019-03-17 NOTE — Telephone Encounter (Signed)
   Scheduler left message voicemail on home and mobile number to call to schedule appointment.(virtual clinic 1/16)

## 2019-03-18 ENCOUNTER — Ambulatory Visit: Payer: PPO | Admitting: Family Medicine

## 2019-03-18 DIAGNOSIS — R309 Painful micturition, unspecified: Secondary | ICD-10-CM | POA: Diagnosis not present

## 2019-03-18 DIAGNOSIS — R8290 Unspecified abnormal findings in urine: Secondary | ICD-10-CM | POA: Diagnosis not present

## 2019-03-27 DIAGNOSIS — G4733 Obstructive sleep apnea (adult) (pediatric): Secondary | ICD-10-CM | POA: Diagnosis not present

## 2019-04-09 ENCOUNTER — Ambulatory Visit: Payer: PPO

## 2019-04-10 ENCOUNTER — Other Ambulatory Visit: Payer: Self-pay

## 2019-04-10 ENCOUNTER — Ambulatory Visit: Payer: PPO | Admitting: Cardiology

## 2019-04-10 ENCOUNTER — Encounter: Payer: Self-pay | Admitting: Cardiology

## 2019-04-10 VITALS — BP 106/80 | HR 101 | Ht 67.0 in | Wt 127.0 lb

## 2019-04-10 DIAGNOSIS — I480 Paroxysmal atrial fibrillation: Secondary | ICD-10-CM

## 2019-04-10 DIAGNOSIS — I491 Atrial premature depolarization: Secondary | ICD-10-CM | POA: Diagnosis not present

## 2019-04-10 DIAGNOSIS — R002 Palpitations: Secondary | ICD-10-CM

## 2019-04-10 DIAGNOSIS — Z7901 Long term (current) use of anticoagulants: Secondary | ICD-10-CM | POA: Diagnosis not present

## 2019-04-10 NOTE — Progress Notes (Signed)
Cardiology Office Note:    Date:  04/10/2019   ID:  Brittany [name], DOB 06-19-1945, MRN [id number]  PCP:  Cassandria Anger, MD  Cardiologist:  Candee Furbish, MD  Electrophysiologist:  [name], MD   Referring MD: Cassandria Anger, MD     History of Present Illness:    Brittany [name] is a 74 y.o. female here for follow-up of atrial fibrillation discovered on event monitor.  She had a brief episode of approximately 2 minutes duration but appears to be irregularly irregular with wandering baseline compatible with atrial fibrillation.  For several years she has felt irregular heartbeats intermittently.  At one point she was at her oncologist and was told that she had an irregular heartbeat at that time.  Back in 2015 she did have a treadmill stress test that showed brief periods of SVT done in [facility] which were asymptomatic.  Brittany [name]. Had a great time.   Overall once again she does not feel her palpitations.  She is without shortness of breath.  She climb 420 stairs.  No chest pain.  05/02/2018 -went to the ER on 04/18/2018 with rapid atrial fibrillation, 175, AFIB.  After dinner, she started to feel her heart jumping out of her chest, racing, pounding in her smart watch said that she had a tachycardia.  It was very irregular.  She is had a few of these episodes up to now.  No fevers chills nausea vomiting syncope bleeding. LDL 85, creatinine 0.8  04/10/2019 office visit: Here for the follow-up of paroxysmal atrial fibrillation.  Since her last visit, she had seen Dr. Curt Bears in EP on 01/12/2019.  Office note reviewed.  She has had abnormal rhythm for multiple years recently diagnosed with A. fib.  She underwent an atrial fibrillation ablation on 10/07/2018.  Doing very well.  Apple watch shows PACs, but is being diagnosed on the apple watch as atrial fibrillation.  Echocardiogram 2019 EF 60%.  Her first met during a Covid vaccine  Past Medical History:  Diagnosis Date   . A-fib ([place])   . Acid reflux   . Anemia   . Arthritis   . Breast cancer ([place]) 11/23/2007   right, ER/PR -, HER 2 -  . [facility])    breast right  triple recet neg, lumpect, chemo, XRT  . Esophageal stricture   . Gastritis 10/2007  . History of chemotherapy    neoadjuvant chemo, last dose 03/2008  . History of radiation therapy 07/25/08 -09/11/08   right breast  . Hx of colonic polyps 08/11/2016  . Hyperlipidemia   . Migraines    rare  . Osteopenia     Past Surgical History:  Procedure Laterality Date  . ATRIAL FIBRILLATION ABLATION N/A 10/07/2018   Procedure: ATRIAL FIBRILLATION ABLATION;  Surgeon: [name], MD;  Location: [facility];  Service: Cardiovascular;  Laterality: N/A;  . BREAST LUMPECTOMY  06/26/2008   right, CA   XRT, chemo  . COLONOSCOPY    . EYE SURGERY     left eye cataract surgery 12/23/2015  . growth removed per right thigh    . JOINT REPLACEMENT  12/31/2015   RTK  . QUADRICEPS TENDON REPAIR Right 09/14/2016   Procedure: OPEN REPAIR QUADRICEP TENDON;  Surgeon: [name], MD;  Location: [facility];  Service: Orthopedics;  Laterality: Right;  . TOTAL KNEE ARTHROPLASTY Right 12/31/2015   Procedure: RIGHT TOTAL KNEE ARTHROPLASTY;  Surgeon: [name], MD;  Location: [facility];  Service: Orthopedics;  Laterality: Right;  . UPPER GASTROINTESTINAL ENDOSCOPY      Current Medications: Current Meds  Medication Sig  . apixaban (ELIQUIS) 5 MG TABS tablet Take 1 tablet (5 mg total) by mouth 2 (two) times daily.  . Biotin 5000 MCG TABS Take 5,000 mcg by mouth daily.  . Cholecalciferol (VITAMIN D) 50 MCG (2000 UT) tablet Take 2,000 Units by mouth daily.  . citalopram (CELEXA) 40 MG tablet TAKE 1 TABLET BY MOUTH EVERY DAY  . denosumab (PROLIA) 60 MG/ML SOLN injection Inject 60 mg into the skin once. Administer in upper arm, thigh, or abdomen  . famotidine (PEPCID) 40 MG tablet Take 1 tablet (40 mg total) by mouth daily.  [name] levocetirizine (XYZAL) 5 MG  tablet Take 5 mg by mouth daily as needed for allergies.   . Omega-3 Fatty Acids (FISH OIL) 1200 MG CAPS Take 1,200 mg by mouth daily.   . Probiotic Product (PROBIOTIC DAILY PO) Take 1 tablet by mouth daily.  . SUMAtriptan (IMITREX) 100 MG tablet TAKE 1 TABLET BY MOUTH ONCE FOR 1 DOSE. MAY REPEAT IN 2 HOURS IF HEADACHE PERSISTS / RECURS  . VALERIAN ROOT PO Take 1 capsule by mouth at bedtime.  . vitamin B-12 (CYANOCOBALAMIN) 1000 MCG tablet Take 1,000 mcg by mouth daily.  [name] zolpidem (AMBIEN) 10 MG tablet TAKE 1/2 TO 1 TABLET BY MOUTH AT BEDTIME AS NEEDED FOR SLEEP     Allergies:   Boniva [ibandronic acid] and Lipitor [atorvastatin]   Social History   Socioeconomic History  . Marital status: Married    Spouse name: Not on file  . Number of children: 3  . Years of education: Not on file  . Highest education level: Not on file  Occupational History    Employer: RETIRED  Tobacco Use  . Smoking status: Former Smoker    Packs/day: 0.25    Years: 5.00    Pack years: 1.25    Types: Cigarettes    Quit date: 03/03/1967    Years since quitting: 52.1  . Smokeless tobacco: Never Used  . Tobacco comment: 6 cig per day when she was smoking/quit years ago  Substance and Sexual Activity  . Alcohol use: Yes    Alcohol/week: 7.0 standard drinks    Types: 7 Cans of beer per week    Comment: 1 beer per day   . Drug use: No  . Sexual activity: Yes    Partners: Male    Birth control/protection: Post-menopausal  Other Topics Concern  . Not on file  Social History Narrative   G3, P3, 1st pregnancy age 29, menopause age 33, no HRT   Social Determinants of Health   Financial Resource Strain:   . Difficulty of Paying Living Expenses: Not on file  Food Insecurity:   . Worried About Charity fundraiser in the Last Year: Not on file  . Ran Out of Food in the Last Year: Not on file  Transportation Needs:   . Lack of Transportation (Medical): Not on file  . Lack of Transportation (Non-Medical): Not  on file  Physical Activity:   . Days of Exercise per Week: Not on file  . Minutes of Exercise per Session: Not on file  Stress:   . Feeling of Stress : Not on file  Social Connections:   . Frequency of Communication with Friends and Family: Not on file  . Frequency of Social Gatherings with Friends and Family: Not on file  . Attends  Religious Services: Not on file  . Active Member of Clubs or Organizations: Not on file  . Attends Archivist Meetings: Not on file  . Marital Status: Not on file     Family History: The patient's family history includes Cancer in her father; Cancer (age of onset: 33) in her sister; Dementia in her mother; Epilepsy in her mother; Heart disease in her father.  ROS:   Please see the history of present illness.     All other systems reviewed and are negative.  EKGs/Labs/Other Studies Reviewed:    The following studies were reviewed today:  Long-term monitor on 12/29/2017:  Brief episode of paroxysmal atrial fibrillation noted with maximum heart rate 163 bpm. Episode lasted approximately 2 minutes.  Occasional ventricular couplets. Occasional PVCs  Slow ventricular run of approximately 79 bpm, 7 beats. Asymptomatic.  Brief 10 beats of paroxysmal atrial tachycardia  Paroxysmal atrial fibrillation episode noted-with her age greater than 48 and female, chads vas score is 2. Please have her follow-up in clinic to discuss anticoagulation.  ECHO 12/29/17:  - Left ventricle: The cavity size was normal. Nair thickness was   normal. Systolic function was normal. The estimated ejection   fraction was in the range of 55% to 60%. Left ventricular   diastolic function parameters were normal. - Aortic valve: Sclerosis without stenosis. - Mitral valve: Leaflets are severely thickened and calcified with   posterior leaflet prolapse but only trivial MR. - Atrial septum: A patent foramen ovale cannot be excluded.  EKG: 04/10/2019-normal sinus rhythm  with PACs.  Apple Watch tracings reviewed-many are labeled atrial fibrillation however there are clear P waves preceding QRS with PACs.  Recent Labs: 08/18/2018: ALT 15 10/07/2018: BUN 13; Creatinine, Ser 0.66; Hemoglobin 13.9; Platelets 286; Potassium 3.9; Sodium 136  Recent Lipid Panel    Component Value Date/Time   CHOL 163 07/31/2015 1153   TRIG 70.0 07/31/2015 1153   HDL 63.30 07/31/2015 1153   CHOLHDL 3 07/31/2015 1153   VLDL 14.0 07/31/2015 1153   LDLCALC 85 07/31/2015 1153   LDLDIRECT 178.9 03/23/2013 1059    Physical Exam:    VS:  BP 106/80   Pulse (!) 101   Ht _0  (1.702 m)   Wt 127 lb (57.6 kg)   LMP 03/02/1994 (Approximate)   SpO2 99%   BMI 19.89 kg/m     Wt Readings from Last 3 Encounters:  04/10/19 127 lb (57.6 kg)  01/17/19 126 lb (57.2 kg)  01/12/19 127 lb 9.6 oz (57.9 kg)     GEN: Thin in no acute distress  HEENT: normal  Neck: no JVD, carotid bruits, or masses Cardiac: RRR rare ectopy; no murmurs, rubs, or gallops,no edema  Respiratory:  clear to auscultation bilaterally, normal work of breathing GI: soft, nontender, nondistended, + BS MS: no deformity or atrophy  Skin: warm and dry, no rash Neuro:  Alert and Oriented x 3, Strength and sensation are intact Psych: euthymic mood, full affect    ASSESSMENT:    1. PAF (paroxysmal atrial fibrillation) (HCC)   2. Palpitations   3. PAC (premature atrial contraction)   4. Chronic anticoagulation    PLAN:    In order of problems listed above:  Paroxysmal atrial fibrillation -Post ablation Dr. Curt Bears 10/07/2018.  Excellent.  Overall doing well.  Apple watch is diagnosing atrial fibrillation however there are P waves present, sinus rhythm with PACs personally reviewed.  My advice to her was that if she did have an episode  where she went 175 beats a minute that this could be a return of atrial fibrillation.  She would let us know then.  She does have some metoprolol on hand that she may take  occasionally if she is feeling increased skips, she does have PACs.  This is fine with me if she would like to continue with that periodically.  Chronic anticoagulation -Continue with apixaban for now since it is fairly early post ablation August 2020.  Perhaps after a year if she is not showing any more evidence of atrial fibrillation we could consider discontinuation.  I would like to see what Dr. Curt Bears thinks.  She will be seeing him soon.  Last hemoglobin 13.9 creatinine 0.6 ALT 15  Paroxysmal atrial tachycardia/PACs -She also had episodes of PAT as well.  Improved.  I am fine with her taking occasional metoprolol, she does have some at home.  As needed.  Mitral valve prolapse - Thickened but only trivial regurgitation.  No changes made.  44yr Medication Adjustments/Labs and Tests Ordered: Current medicines are reviewed at length with the patient today.  Concerns regarding medicines are outlined above.  Orders Placed This Encounter  Procedures  . EKG 12-Lead   No orders of the defined types were placed in this encounter.   Patient Instructions  Medication Instructions:  The current medical regimen is effective;  continue present plan and medications.  *If you need a refill on your cardiac medications before your next appointment, please call your pharmacy*  Follow-Up: At [facility] you and your health needs are our priority.  As part of our continuing mission to provide you with exceptional heart care, we have created designated Provider Care Teams.   Care Teams include your primary Cardiologist (physician) and Advanced Practice Providers (APPs -  Physician Assistants and Nurse Practitioners) who all work together to provide you with the care you need, when you need it.  Your next appointment:   12 month(s)  The format for your next appointment:   In Person  Provider:   MCandee Furbish MD  [facility]!        Signed, MCandee Furbish MD  04/10/2019 12:02 PM    [place]

## 2019-04-10 NOTE — Patient Instructions (Addendum)
Medication Instructions:  The current medical regimen is effective;  continue present plan and medications.  *If you need a refill on your cardiac medications before your next appointment, please call your pharmacy*  Follow-Up: At [facility], you and your health needs are our priority.  As part of our continuing mission to provide you with exceptional heart care, we have created designated Provider Care Teams.   Care Teams include your primary Cardiologist (physician) and Advanced Practice Providers (APPs -  Physician Assistants and Nurse Practitioners) who all work together to provide you with the care you need, when you need it.  Your next appointment:   12 month(s)  The format for your next appointment:   In Person  Provider:   Candee Furbish, MD  Thank you for choosing [facility] [place]!!

## 2019-04-13 ENCOUNTER — Ambulatory Visit: Payer: PPO

## 2019-04-17 ENCOUNTER — Other Ambulatory Visit: Payer: Self-pay | Admitting: Podiatry

## 2019-04-17 ENCOUNTER — Encounter: Payer: Self-pay | Admitting: Podiatry

## 2019-04-17 ENCOUNTER — Ambulatory Visit (INDEPENDENT_AMBULATORY_CARE_PROVIDER_SITE_OTHER): Payer: PPO

## 2019-04-17 ENCOUNTER — Ambulatory Visit: Payer: PPO | Admitting: Podiatry

## 2019-04-17 ENCOUNTER — Other Ambulatory Visit: Payer: Self-pay

## 2019-04-17 VITALS — Temp 96.7°F

## 2019-04-17 DIAGNOSIS — M21619 Bunion of unspecified foot: Secondary | ICD-10-CM

## 2019-04-17 DIAGNOSIS — B351 Tinea unguium: Secondary | ICD-10-CM

## 2019-04-17 DIAGNOSIS — M79672 Pain in left foot: Secondary | ICD-10-CM | POA: Diagnosis not present

## 2019-04-17 DIAGNOSIS — L84 Corns and callosities: Secondary | ICD-10-CM | POA: Diagnosis not present

## 2019-04-17 DIAGNOSIS — M79671 Pain in right foot: Secondary | ICD-10-CM

## 2019-04-17 DIAGNOSIS — M2012 Hallux valgus (acquired), left foot: Secondary | ICD-10-CM

## 2019-04-17 DIAGNOSIS — M2011 Hallux valgus (acquired), right foot: Secondary | ICD-10-CM

## 2019-04-17 MED ORDER — TERBINAFINE HCL 250 MG PO TABS: ORAL_TABLET | ORAL | 0 refills | Status: DC

## 2019-04-17 NOTE — Patient Instructions (Signed)
Bunion  A bunion is a bump on the base of the big toe that forms when the bones of the big toe joint move out of position. Bunions may be small at first, but they often get larger over time. They can make walking painful. What are the causes? A bunion may be caused by:  Wearing narrow or pointed shoes that force the big toe to press against the other toes.  Abnormal foot development that causes the foot to roll inward (pronate).  Changes in the foot that are caused by certain diseases, such as rheumatoid arthritis or polio.  A foot injury. What increases the risk? The following factors may make you more likely to develop this condition:  Wearing shoes that squeeze the toes together.  Having certain diseases, such as: ? Rheumatoid arthritis. ? Polio. ? Cerebral palsy.  Having family members who have bunions.  Being born with a foot deformity, such as flat feet or low arches.  Doing activities that put a lot of pressure on the feet, such as ballet dancing. What are the signs or symptoms? The main symptom of a bunion is a noticeable bump on the big toe. Other symptoms may include:  Pain.  Swelling around the big toe.  Redness and inflammation.  Thick or hardened skin on the big toe or between the toes.  Stiffness or loss of motion in the big toe.  Trouble with walking. How is this diagnosed? A bunion may be diagnosed based on your symptoms, medical history, and activities. You may have tests, such as:  X-rays. These allow your health care provider to check the position of the bones in your foot and look for damage to your joint. They also help your health care provider determine the severity of your bunion and the best way to treat it.  Joint aspiration. In this test, a sample of fluid is removed from the toe joint. This test may be done if you are in a lot of pain. It helps rule out diseases that cause painful swelling of the joints, such as arthritis. How is this  treated? Treatment depends on the severity of your symptoms. The goal of treatment is to relieve symptoms and prevent the bunion from getting worse. Your health care provider may recommend:  Wearing shoes that have a wide toe box.  Using bunion pads to cushion the affected area.  Taping your toes together to keep them in a normal position.  Placing a device inside your shoe (orthotics) to help reduce pressure on your toe joint.  Taking medicine to ease pain, inflammation, and swelling.  Applying heat or ice to the affected area.  Doing stretching exercises.  Surgery to remove scar tissue and move the toes back into their normal position. This treatment is rare. Follow these instructions at home: Managing pain, stiffness, and swelling   If directed, put ice on the painful area: ? Put ice in a plastic bag. ? Place a towel between your skin and the bag. ? Leave the ice on for 20 minutes, 2-3 times a day. Activity   If directed, apply heat to the affected area before you exercise. Use the heat source that your health care provider recommends, such as a moist heat pack or a heating pad. ? Place a towel between your skin and the heat source. ? Leave the heat on for 20-30 minutes. ? Remove the heat if your skin turns bright red. This is especially important if you are unable to feel pain,   heat, or cold. You may have a greater risk of getting burned.  Do exercises as told by your health care provider. General instructions  Support your toe joint with proper footwear, shoe padding, or taping as told by your health care provider.  Take over-the-counter and prescription medicines only as told by your health care provider.  Keep all follow-up visits as told by your health care provider. This is important. Contact a health care provider if your symptoms:  Get worse.  Do not improve in 2 weeks. Get help right away if you have:  Severe pain and trouble with walking. Summary  A  bunion is a bump on the base of the big toe that forms when the bones of the big toe joint move out of position.  Bunions can make walking painful.  Treatment depends on the severity of your symptoms.  Support your toe joint with proper footwear, shoe padding, or taping as told by your health care provider. This information is not intended to replace advice given to you by your health care provider. Make sure you discuss any questions you have with your health care provider. Document Revised: 08/23/2017 Document Reviewed: 06/29/2017 Elsevier Patient Education  2020 [employer] Inc.  

## 2019-04-17 NOTE — Progress Notes (Signed)
   Subjective:    Patient ID: Brittany [name], female    DOB: [birth year], 74 y.o.   MRN: [id number]  HPI    Review of Systems  All other systems reviewed and are negative.      Objective:   Physical Exam        Assessment & Plan:

## 2019-04-19 NOTE — Progress Notes (Signed)
Subjective:   Patient ID: Brittany [name], female   DOB: 74 y.o.   MRN: [id number]   HPI Patient presents stating that she is having trouble mostly between the big toe and the second toe of the right foot with irritation between with mild deformity of the left.  Patient states that she also has concerns about discoloration of her left big toenail and states it seems the pain between the toes is getting worse.  Patient does not smoke likes to be active   Review of Systems  All other systems reviewed and are negative.       Objective:  Physical Exam Vitals and nursing note reviewed.  Constitutional:      Appearance: She is well-developed.  Pulmonary:     Effort: Pulmonary effort is normal.  Musculoskeletal:        General: Normal range of motion.  Skin:    General: Skin is warm.  Neurological:     Mental Status: She is alert.     Neurovascular status found to be intact muscle strength was found to be adequate range of motion within normal limits.  Patient is found to have hallux right which is rotated over top the second digit and is irritative with deformity at the MPJ with keratotic lesion between the hallux second toe that also gets sore.  Left big toenails thickened with moderate deformity of the forefoot left also noted.  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  I educated her on this and will get a try to hold off for today I debrided the lesion I applied a pad to the hallux in order to provide for protection of the toe and patient will be seen back as needed we will determine whether or  not surgical intervention in the future will be necessary  X-ray indicates that there is a significant rotation of the hallux right over top second toe

## 2019-04-24 ENCOUNTER — Telehealth: Payer: Self-pay | Admitting: Internal Medicine

## 2019-04-24 NOTE — Telephone Encounter (Signed)
    Patient requesting to transfer care from Dr Alain Marion to Dr Quay Burow. Patient prefers female MD.

## 2019-04-25 ENCOUNTER — Ambulatory Visit: Payer: PPO | Admitting: Cardiology

## 2019-04-25 ENCOUNTER — Encounter: Payer: Self-pay | Admitting: Cardiology

## 2019-04-25 ENCOUNTER — Other Ambulatory Visit: Payer: Self-pay

## 2019-04-25 VITALS — BP 114/68 | HR 82 | Ht 67.0 in | Wt 127.4 lb

## 2019-04-25 DIAGNOSIS — I480 Paroxysmal atrial fibrillation: Secondary | ICD-10-CM | POA: Diagnosis not present

## 2019-04-25 NOTE — Progress Notes (Signed)
Electrophysiology Office Note   Date:  04/25/2019   ID:  Brittany [name], DOB May 22, 1945, MRN [id number]  PCP:  Tresa Garter, MD  Cardiologist:  Anne Fu Primary Electrophysiologist:  Raffael Bugarin Jorja Loa, MD    No chief complaint on file.    History of Present Illness: Brittany [name] is a 74 y.o. female who is being seen today for the evaluation of atrial fibrillation at the request of Plotnikov, Georgina Quint, MD. Presenting today for electrophysiology evaluation.  She has a history of atrial fibrillation, breast cancer status post lumpectomy, chemo and XRT.  She wore a cardiac monitor that showed a brief episode of atrial fibrillation.  She has had palpitations for many years.  She did have a treadmill test that showed a brief episode of SVT for which she was asymptomatic.  She went to the emergency room 04/18/2018 with rapid atrial fibrillation rates 175.  She had palpitations with racing and pounding.  She does state that she has had an abnormal rhythm for multiple years, but only has just been diagnosed with atrial fibrillation.  She is now status post day of ablation 10/07/2018.  Today, denies symptoms of palpitations, chest pain, shortness of breath, orthopnea, PND, lower extremity edema, claudication, dizziness, presyncope, syncope, bleeding, or neurologic sequela. The patient is tolerating medications without difficulties.  All she is doing well.  She has noted no further episodes of atrial fibrillation.  She is able to do all of her daily activities without significant restriction.   Past Medical History:  Diagnosis Date  . A-fib (HCC)   . Acid reflux   . Anemia   . Arthritis   . Breast cancer (HCC) 11/23/2007   right, ER/PR -, HER 2 -  . Cancer [facility])    breast right  triple recet neg, lumpect, chemo, XRT  . Esophageal stricture   . Gastritis 10/2007  . History of chemotherapy    neoadjuvant chemo, last dose 03/2008  . History of radiation therapy 07/25/08 -09/11/08   right breast   . Hx of colonic polyps 08/11/2016  . Hyperlipidemia   . Migraines    rare  . Osteopenia    Past Surgical History:  Procedure Laterality Date  . ATRIAL FIBRILLATION ABLATION N/A 10/07/2018   Procedure: ATRIAL FIBRILLATION ABLATION;  Surgeon: Regan Lemming, MD;  Location: [facility];  Service: Cardiovascular;  Laterality: N/A;  . BREAST LUMPECTOMY  06/26/2008   right, CA   XRT, chemo  . COLONOSCOPY    . EYE SURGERY     left eye cataract surgery 12/23/2015  . growth removed per right thigh    . JOINT REPLACEMENT  12/31/2015   RTK  . QUADRICEPS TENDON REPAIR Right 09/14/2016   Procedure: OPEN REPAIR QUADRICEP TENDON;  Surgeon: Durene Romans, MD;  Location: [facility] ORS;  Service: Orthopedics;  Laterality: Right;  . TOTAL KNEE ARTHROPLASTY Right 12/31/2015   Procedure: RIGHT TOTAL KNEE ARTHROPLASTY;  Surgeon: Durene Romans, MD;  Location: [facility] ORS;  Service: Orthopedics;  Laterality: Right;  . UPPER GASTROINTESTINAL ENDOSCOPY       Current Outpatient Medications  Medication Sig Dispense Refill  . apixaban (ELIQUIS) 5 MG TABS tablet Take 1 tablet (5 mg total) by mouth 2 (two) times daily. 60 tablet 3  . Biotin 5000 MCG TABS Take 5,000 mcg by mouth daily.    . Cholecalciferol (VITAMIN D) 50 MCG (2000 UT) tablet Take 2,000 Units by mouth daily.    . citalopram (CELEXA) 40 MG tablet  TAKE 1 TABLET BY MOUTH EVERY DAY 90 tablet 3  . denosumab (PROLIA) 60 MG/ML SOLN injection Inject 60 mg into the skin once. Administer in upper arm, thigh, or abdomen 1.8 mL 1  . famotidine (PEPCID) 40 MG tablet Take 1 tablet (40 mg total) by mouth daily. 90 tablet 0  . levocetirizine (XYZAL) 5 MG tablet Take 5 mg by mouth daily as needed for allergies.     . Omega-3 Fatty Acids (FISH OIL) 1200 MG CAPS Take 1,200 mg by mouth daily.     . Probiotic Product (PROBIOTIC DAILY PO) Take 1 tablet by mouth daily.    . SUMAtriptan (IMITREX) 100 MG tablet TAKE 1 TABLET BY MOUTH ONCE FOR 1 DOSE. MAY REPEAT IN 2 HOURS  IF HEADACHE PERSISTS / RECURS 9 tablet 5  . terbinafine (LAMISIL) 250 MG tablet Please take one a day x 7days, repeat every 4 weeks x 4 months 28 tablet 0  . VALERIAN ROOT PO Take 1 capsule by mouth at bedtime.    . vitamin B-12 (CYANOCOBALAMIN) 1000 MCG tablet Take 1,000 mcg by mouth daily.    [name] zolpidem (AMBIEN) 10 MG tablet TAKE 1/2 TO 1 TABLET BY MOUTH AT BEDTIME AS NEEDED FOR SLEEP 90 tablet 0   No current facility-administered medications for this visit.    Allergies:   Boniva [ibandronic acid] and Lipitor [atorvastatin]   Social History:  The patient  reports that she quit smoking about 52 years ago. Her smoking use included cigarettes. She has a 1.25 pack-year smoking history. She has never used smokeless tobacco. She reports current alcohol use of about 7.0 standard drinks of alcohol per week. She reports that she does not use drugs.   Family History:  The patient's family history includes Cancer in her father; Cancer (age of onset: 74) in her sister; Dementia in her mother; Epilepsy in her mother; Heart disease in her father.   ROS:  Please see the history of present illness.   Otherwise, review of systems is positive for none.   All other systems are reviewed and negative.   PHYSICAL EXAM: VS:  BP 114/68   Pulse 82   Ht 5\' 7"  (1.702 m)   Wt 127 lb 6.4 oz (57.8 kg)   LMP 03/02/1994 (Approximate)   SpO2 99%   BMI 19.95 kg/m  , BMI Body mass index is 19.95 kg/m. GEN: Well nourished, well developed, in no acute distress  HEENT: normal  Neck: no JVD, carotid bruits, or masses Cardiac: RRR; no murmurs, rubs, or gallops,no edema  Respiratory:  clear to auscultation bilaterally, normal work of breathing GI: soft, nontender, nondistended, + BS MS: no deformity or atrophy  Skin: warm and dry Neuro:  Strength and sensation are intact Psych: euthymic mood, full affect  EKG:  EKG is not ordered today. Personal review of the ekg ordered 04/10/19 shows sinus rhythm, rate 101   Recent Labs: 08/18/2018: ALT 15 10/07/2018: BUN 13; Creatinine, Ser 0.66; Hemoglobin 13.9; Platelets 286; Potassium 3.9; Sodium 136    Lipid Panel     Component Value Date/Time   CHOL 163 07/31/2015 1153   TRIG 70.0 07/31/2015 1153   HDL 63.30 07/31/2015 1153   CHOLHDL 3 07/31/2015 1153   VLDL 14.0 07/31/2015 1153   LDLCALC 85 07/31/2015 1153   LDLDIRECT 178.9 03/23/2013 1059     Wt Readings from Last 3 Encounters:  04/25/19 127 lb 6.4 oz (57.8 kg)  04/10/19 127 lb (57.6 kg)  01/17/19 126 lb (57.2  kg)      Other studies Reviewed: Additional studies/ records that were reviewed today include: TTE 12/29/17  Review of the above records today demonstrates:  - Left ventricle: The cavity size was normal. Yepiz thickness was   normal. Systolic function was normal. The estimated ejection   fraction was in the range of 55% to 60%. Left ventricular   diastolic function parameters were normal. - Aortic valve: Sclerosis without stenosis. - Mitral valve: Leaflets are severely thickened and calcified with   posterior leaflet prolapse but only trivial MR. - Atrial septum: A patent foramen ovale cannot be excluded. - Left atrium:  The atrium was normal in size.  30 day monitor 01/24/18 - personally reviewed  Brief episode of paroxysmal atrial fibrillation noted with maximum heart rate 163 bpm. Episode lasted approximately 2 minutes.  Occasional ventricular couplets. Occasional PVCs  Slow ventricular run of approximately 79 bpm, 7 beats. Asymptomatic.  Brief 10 beats of paroxysmal atrial tachycardia   ASSESSMENT AND PLAN:  1.  Paroxysmal atrial fibrillation: Currently on Eliquis.  Status post AF ablation 10/07/2018.  CHA2DS2-VASc of 2.  No changes  2.  Obstructive sleep apnea: CPAP compliance encouraged  Current medicines are reviewed at length with the patient today.   The patient does not have concerns regarding her medicines.  The following changes were made today: none  Labs/  tests ordered today include:  No orders of the defined types were placed in this encounter.   Disposition:   FU with Jadore Mcguffin 6 months  Signed, Joanna Hall Jorja Loa, MD  04/25/2019 10:08 AM     [facility] [address] [place] [address] [phone] (office) [phone] (fax)

## 2019-04-25 NOTE — Telephone Encounter (Signed)
Okay with me.  Thank you °

## 2019-04-25 NOTE — Telephone Encounter (Signed)
Patient made aware.

## 2019-04-25 NOTE — Patient Instructions (Signed)
Medication Instructions:  Your physician recommends that you continue on your current medications as directed. Please refer to the Current Medication list given to you today.  *If you need a refill on your cardiac medications before your next appointment, please call your pharmacy*   Lab Work: None ordered If you have labs (blood work) drawn today and your tests are completely normal, you will receive your results only by: . MyChart Message (if you have MyChart) OR . A paper copy in the mail If you have any lab test that is abnormal or we need to change your treatment, we will call you to review the results.   Testing/Procedures: None ordered   Follow-Up: At [facility], you and your health needs are our priority.  As part of our continuing mission to provide you with exceptional heart care, we have created designated Provider Care Teams.  These Care Teams include your primary Cardiologist (physician) and Advanced Practice Providers (APPs -  Physician Assistants and Nurse Practitioners) who all work together to provide you with the care you need, when you need it.  Your next appointment:   6 month(s)  The format for your next appointment:   In Person  Provider:   Will Camnitz, MD    Thank you for choosing [facility]!!   Nely Dedmon, RN [phone]    

## 2019-04-25 NOTE — Telephone Encounter (Signed)
I am not accepting any transfer patients.

## 2019-04-26 ENCOUNTER — Ambulatory Visit: Payer: PPO

## 2019-04-27 DIAGNOSIS — G4733 Obstructive sleep apnea (adult) (pediatric): Secondary | ICD-10-CM | POA: Diagnosis not present

## 2019-05-01 ENCOUNTER — Ambulatory Visit (INDEPENDENT_AMBULATORY_CARE_PROVIDER_SITE_OTHER): Payer: PPO | Admitting: *Deleted

## 2019-05-01 ENCOUNTER — Other Ambulatory Visit: Payer: Self-pay

## 2019-05-01 DIAGNOSIS — B351 Tinea unguium: Secondary | ICD-10-CM

## 2019-05-01 NOTE — Progress Notes (Signed)
Patient presents today for the 1st laser treatment. Diagnosed with mycotic nail infection by Dr. Paulla Dolly. Toenail most affected hallux nail left.  All other systems are negative.  Nails were filed thin. Laser therapy was administered to 1st toenails left and patient tolerated the treatment well. All safety precautions were in place.   Patient also started a pulse dosing of terbinafine a couple weeks ago.   Follow up in 4 weeks for laser # 2.  Picture of nails taken today to document visual progress   Dr. Paulla Dolly would like patient to have 3 laser treatments total

## 2019-05-01 NOTE — Patient Instructions (Signed)
Follow-up care is important to the success of your foot laser treatment. Please keep these instructions for future reference.    1. Normal activity can resume immediately.   2. IF you doctor prescribed an antifungal cream apply medication to the skin on the bottom of your feet, sides of your feet, between your toes, and around your nails. (This cream is not intended for use on your nails) Apply cream as directed  3. IF a topical for your nails was prescribed by your doctor, apply to nail/nails once daily until nail is free from infection unless otherwise directed by your doctor. Maximum use for nail topical is 12 months.   4. Spray the insides of your shoes with an over the counter antifungal spray or Lysol disinfectant (aerosol can) at the end of each day or use an ultra violet shoe sterilizer as directed. Try not to wear the same shoes everyday.   5. Buy new nail clippers and files since your current pair may be infected. Metal nail care instruments may also be cleaned with diluted bleach or boiling water. DO NOT share nail clippers or nail files.  6. Keep your toenails trimmed and clean.   7. Wear flip-flops in public places especially hotel rooms and showers, athletic club locker rooms and showers and indoor swimming pools.   8. Avoid nail salons that do not clean their instruments properly or use a whirlpool system.  

## 2019-05-05 DIAGNOSIS — M8589 Other specified disorders of bone density and structure, multiple sites: Secondary | ICD-10-CM | POA: Diagnosis not present

## 2019-05-05 LAB — HM DEXA SCAN

## 2019-05-08 ENCOUNTER — Other Ambulatory Visit: Payer: PPO

## 2019-05-10 ENCOUNTER — Telehealth: Payer: Self-pay | Admitting: *Deleted

## 2019-05-10 ENCOUNTER — Encounter: Payer: Self-pay | Admitting: Oncology

## 2019-05-10 ENCOUNTER — Other Ambulatory Visit: Payer: Self-pay | Admitting: Internal Medicine

## 2019-05-10 ENCOUNTER — Telehealth: Payer: Self-pay | Admitting: Internal Medicine

## 2019-05-10 NOTE — Telephone Encounter (Signed)
Insurance has been submitted and verified for Prolia. Patient is responsible for a $255 copay. Due anytime. Left message for patient to call back to schedule.  Okay to schedule... Visit Note: Prolia ($255 copay - okay to give per [place]) Visit Type: Nurse Provider: Nurse 

## 2019-05-10 NOTE — Telephone Encounter (Signed)
Milner, Beckey Rutter to [name], [name], MD      05/10/19 1:59 PM My co-pay for Prolia injection is $255 at the [facility]. My insurance company says if you will request my pharmacy to get the Prolia the cost would be $180. The pharmacist can give me the shot or I can go to my internist to get it. Will you get the prescription to [facility] on [address] for me?  Thanks for your consideration.  Hinda Lenis to Arletta Bale D     05/10/19 3:50 PM Your inquiry is new to [place]- we have never prescribed the prolia outside of this office because it is labeled as an in office medication.  If I am correct you are not due for the injection until June ( or July at your next appointment ) so I am forwarding your inquiry to not only Dr Jana Hakim to other personnel in regards to your request.  We certainly would like to help you save money but need to verify how to do this best - the injection needs to have labs checked prior for safe administration. So it may be that you have labs and see Dr Jana Hakim and if all is good - he would then send the prescription to your pharmacy.  I will keep you informed- thank you Val RN with Dr Jana Hakim

## 2019-05-22 ENCOUNTER — Encounter: Payer: Self-pay | Admitting: Internal Medicine

## 2019-05-22 ENCOUNTER — Other Ambulatory Visit: Payer: PPO

## 2019-05-25 DIAGNOSIS — G4733 Obstructive sleep apnea (adult) (pediatric): Secondary | ICD-10-CM | POA: Diagnosis not present

## 2019-05-30 ENCOUNTER — Other Ambulatory Visit: Payer: Self-pay | Admitting: Cardiology

## 2019-05-30 NOTE — Telephone Encounter (Signed)
Age 74, weight 57.8kg, SCr 0.66 on 10/07/18, last OV Feb 2021, afib indication

## 2019-05-31 DIAGNOSIS — G4733 Obstructive sleep apnea (adult) (pediatric): Secondary | ICD-10-CM | POA: Diagnosis not present

## 2019-06-01 ENCOUNTER — Ambulatory Visit (INDEPENDENT_AMBULATORY_CARE_PROVIDER_SITE_OTHER): Payer: PPO | Admitting: *Deleted

## 2019-06-01 ENCOUNTER — Other Ambulatory Visit: Payer: Self-pay

## 2019-06-01 DIAGNOSIS — B351 Tinea unguium: Secondary | ICD-10-CM

## 2019-06-01 NOTE — Progress Notes (Signed)
Patient presents today for the 2nd laser treatment. Diagnosed with mycotic nail infection by Dr. Paulla Dolly. Toenail most affected hallux nail left, along the lateral border.  All other systems are negative.  Nail was filed thin. Laser therapy was administered to 1st toenails left and patient tolerated the treatment well. All safety precautions were in place.   Patient continuing pulse dosing of terbinafine   Follow up in 4 weeks for laser # 3, final treatment.   Dr. Paulla Dolly would like patient to have 3 laser treatments total

## 2019-06-06 ENCOUNTER — Other Ambulatory Visit: Payer: Self-pay | Admitting: Internal Medicine

## 2019-06-30 ENCOUNTER — Ambulatory Visit (INDEPENDENT_AMBULATORY_CARE_PROVIDER_SITE_OTHER): Payer: PPO | Admitting: *Deleted

## 2019-06-30 ENCOUNTER — Other Ambulatory Visit: Payer: PPO

## 2019-06-30 ENCOUNTER — Other Ambulatory Visit: Payer: Self-pay

## 2019-06-30 DIAGNOSIS — B351 Tinea unguium: Secondary | ICD-10-CM

## 2019-06-30 NOTE — Progress Notes (Signed)
Patient presents today for the 3rd laser treatment. Diagnosed with mycotic nail infection by Dr. Paulla Dolly.   Toenail most affected hallux nail left, along the lateral border. The nail hasn't grown out very much.  All other systems are negative.  Nail was filed thin. Laser therapy was administered to 1st toenails left and patient tolerated the treatment well. All safety precautions were in place.   Patient continuing pulse dosing of terbinafine, with one more month to go.  Since the nail is growing so slow, I will follow up with her in 8 weeks and we'll decide at that time whether another laser is necessary.

## 2019-07-12 ENCOUNTER — Other Ambulatory Visit: Payer: Self-pay | Admitting: Internal Medicine

## 2019-07-17 ENCOUNTER — Other Ambulatory Visit: Payer: PPO

## 2019-07-24 ENCOUNTER — Ambulatory Visit (INDEPENDENT_AMBULATORY_CARE_PROVIDER_SITE_OTHER): Payer: PPO | Admitting: Internal Medicine

## 2019-07-24 ENCOUNTER — Encounter: Payer: Self-pay | Admitting: Internal Medicine

## 2019-07-24 ENCOUNTER — Other Ambulatory Visit: Payer: Self-pay

## 2019-07-24 VITALS — BP 116/70 | HR 79 | Temp 98.5°F | Ht 67.0 in | Wt 125.0 lb

## 2019-07-24 DIAGNOSIS — I480 Paroxysmal atrial fibrillation: Secondary | ICD-10-CM

## 2019-07-24 DIAGNOSIS — E785 Hyperlipidemia, unspecified: Secondary | ICD-10-CM | POA: Diagnosis not present

## 2019-07-24 DIAGNOSIS — K219 Gastro-esophageal reflux disease without esophagitis: Secondary | ICD-10-CM

## 2019-07-24 DIAGNOSIS — E559 Vitamin D deficiency, unspecified: Secondary | ICD-10-CM | POA: Diagnosis not present

## 2019-07-24 DIAGNOSIS — M810 Age-related osteoporosis without current pathological fracture: Secondary | ICD-10-CM

## 2019-07-24 DIAGNOSIS — E538 Deficiency of other specified B group vitamins: Secondary | ICD-10-CM | POA: Diagnosis not present

## 2019-07-24 LAB — URINALYSIS, ROUTINE W REFLEX MICROSCOPIC
Bilirubin Urine: NEGATIVE
Hgb urine dipstick: NEGATIVE
Ketones, ur: NEGATIVE
Nitrite: NEGATIVE
Specific Gravity, Urine: 1.015 (ref 1.000–1.030)
Total Protein, Urine: NEGATIVE
Urine Glucose: NEGATIVE
Urobilinogen, UA: 0.2 (ref 0.0–1.0)
pH: 7.5 (ref 5.0–8.0)

## 2019-07-24 LAB — BASIC METABOLIC PANEL
BUN: 14 mg/dL (ref 6–23)
CO2: 28 mEq/L (ref 19–32)
Calcium: 9.1 mg/dL (ref 8.4–10.5)
Chloride: 102 mEq/L (ref 96–112)
Creatinine, Ser: 0.72 mg/dL (ref 0.40–1.20)
GFR: 79.65 mL/min (ref >60.00)
Glucose, Bld: 93 mg/dL (ref 70–99)
Potassium: 4.1 mEq/L (ref 3.5–5.1)
Sodium: 137 mEq/L (ref 135–145)

## 2019-07-24 LAB — CBC WITH DIFFERENTIAL/PLATELET
Basophils Absolute: 0.1 10*3/uL (ref 0.0–0.1)
Basophils Relative: 1.4 % (ref 0.0–3.0)
Eosinophils Absolute: 0.2 10*3/uL (ref 0.0–0.7)
Eosinophils Relative: 3.6 % (ref 0.0–5.0)
HCT: 37.9 % (ref 36.0–46.0)
Hemoglobin: 12.8 g/dL (ref 12.0–15.0)
Lymphocytes Relative: 28.7 % (ref 12.0–46.0)
Lymphs Abs: 1.3 10*3/uL (ref 0.7–4.0)
MCHC: 33.8 g/dL (ref 30.0–36.0)
MCV: 95.3 fl (ref 78.0–100.0)
Monocytes Absolute: 0.4 10*3/uL (ref 0.1–1.0)
Monocytes Relative: 9.5 % (ref 3.0–12.0)
Neutro Abs: 2.6 10*3/uL (ref 1.4–7.7)
Neutrophils Relative %: 56.8 % (ref 43.0–77.0)
Platelets: 237 10*3/uL (ref 150.0–400.0)
RBC: 3.98 Mil/uL (ref 3.87–5.11)
RDW: 13.6 % (ref 11.5–15.5)
WBC: 4.5 10*3/uL (ref 4.0–10.5)

## 2019-07-24 LAB — LIPID PANEL
Cholesterol: 213 mg/dL — ABNORMAL HIGH (ref 0–200)
HDL: 53.9 mg/dL (ref >39.00)
LDL Cholesterol: 146 mg/dL — ABNORMAL HIGH (ref 0–99)
NonHDL: 159.35
Total CHOL/HDL Ratio: 4
Triglycerides: 68 mg/dL (ref 0.0–149.0)
VLDL: 13.6 mg/dL (ref 0.0–40.0)

## 2019-07-24 LAB — VITAMIN D 25 HYDROXY (VIT D DEFICIENCY, FRACTURES): VITD: 58.83 ng/mL (ref 30.00–100.00)

## 2019-07-24 LAB — TSH: TSH: 1.44 u[IU]/mL (ref 0.35–4.50)

## 2019-07-24 LAB — HEPATIC FUNCTION PANEL
ALT: 10 U/L (ref 0–35)
AST: 14 U/L (ref 0–37)
Albumin: 4.1 g/dL (ref 3.5–5.2)
Alkaline Phosphatase: 53 U/L (ref 39–117)
Bilirubin, Direct: 0.1 mg/dL (ref 0.0–0.3)
Total Bilirubin: 0.4 mg/dL (ref 0.2–1.2)
Total Protein: 6.6 g/dL (ref 6.0–8.3)

## 2019-07-24 LAB — VITAMIN B12: Vitamin B-12: 901 pg/mL (ref 211–911)

## 2019-07-24 MED ORDER — ZOLPIDEM TARTRATE 10 MG PO TABS: 5.0000 mg | ORAL_TABLET | Freq: Every evening | ORAL | 0 refills | Status: DC | PRN

## 2019-07-24 MED ORDER — FAMOTIDINE 40 MG PO TABS: 40.0000 mg | ORAL_TABLET | Freq: Every day | ORAL | 3 refills | Status: DC

## 2019-07-24 NOTE — Addendum Note (Signed)
Addended by: Boris Lown B on: 07/24/2019 09:46 AM   Modules accepted: Orders

## 2019-07-24 NOTE — Assessment & Plan Note (Signed)
Prolia Vit D 

## 2019-07-24 NOTE — Progress Notes (Signed)
Subjective:  Patient ID: Brittany [name], female    DOB: 16-Apr-1945  Age: 74 y.o. MRN: [id number]  CC: No chief complaint on file.   HPI Brittany [name] presents for insomnia, GERD, anticoagulation f/u  Outpatient Medications Prior to Visit  Medication Sig Dispense Refill  . Biotin 5000 MCG TABS Take 5,000 mcg by mouth daily.    . Cholecalciferol (VITAMIN D) 50 MCG (2000 UT) tablet Take 2,000 Units by mouth daily.    . citalopram (CELEXA) 40 MG tablet TAKE 1 TABLET BY MOUTH EVERY DAY 90 tablet 3  . denosumab (PROLIA) 60 MG/ML SOLN injection Inject 60 mg into the skin once. Administer in upper arm, thigh, or abdomen 1.8 mL 1  . ELIQUIS 5 MG TABS tablet TAKE 1 TABLET BY MOUTH TWICE A DAY 60 tablet 5  . Omega-3 Fatty Acids (FISH OIL) 1200 MG CAPS Take 1,200 mg by mouth daily.     . Probiotic Product (PROBIOTIC DAILY PO) Take 1 tablet by mouth daily.    . SUMAtriptan (IMITREX) 100 MG tablet TAKE 1 TABLET BY MOUTH ONCE FOR 1 DOSE. MAY REPEAT IN 2 HOURS IF HEADACHE PERSISTS / RECURS 9 tablet 5  . VALERIAN ROOT PO Take 1 capsule by mouth at bedtime.    . vitamin B-12 (CYANOCOBALAMIN) 1000 MCG tablet Take 1,000 mcg by mouth daily.    . famotidine (PEPCID) 40 MG tablet Take 1 tablet (40 mg total) by mouth daily. OFFICE VISIT DUE 30 tablet 0  . zolpidem (AMBIEN) 10 MG tablet TAKE 1/2 TO 1 TABLET BY MOUTH AT BEDTIME AS NEEDED FOR SLEEP 90 tablet 0  . levocetirizine (XYZAL) 5 MG tablet Take 5 mg by mouth daily as needed for allergies.     . metoprolol tartrate (LOPRESSOR) 25 MG tablet Take 25 mg by mouth 2 (two) times daily.    [name] terbinafine (LAMISIL) 250 MG tablet Please take one a day x 7days, repeat every 4 weeks x 4 months (Patient not taking: Reported on 07/24/2019) 28 tablet 0   No facility-administered medications prior to visit.    ROS: Review of Systems  Constitutional: Negative for activity change, appetite change, chills, fatigue and unexpected weight change.  HENT: Negative for  congestion, mouth sores and sinus pressure.   Eyes: Negative for visual disturbance.  Respiratory: Negative for cough and chest tightness.   Gastrointestinal: Negative for abdominal pain and nausea.  Genitourinary: Negative for difficulty urinating, frequency and vaginal pain.  Musculoskeletal: Positive for gait problem. Negative for back pain.  Skin: Negative for pallor and rash.  Neurological: Negative for dizziness, tremors, weakness, numbness and headaches.  Psychiatric/Behavioral: Negative for confusion and sleep disturbance.    Objective:  BP 116/70 (BP Location: Left Arm, Patient Position: Sitting, Cuff Size: Normal)   Pulse 79   Temp 98.5 F (36.9 C) (Oral)   Ht 5\' 7"  (1.702 m)   Wt 125 lb (56.7 kg)   LMP 03/02/1994 (Approximate)   SpO2 97%   BMI 19.58 kg/m   BP Readings from Last 3 Encounters:  07/24/19 116/70  04/25/19 114/68  04/10/19 106/80    Wt Readings from Last 3 Encounters:  07/24/19 125 lb (56.7 kg)  04/25/19 127 lb 6.4 oz (57.8 kg)  04/10/19 127 lb (57.6 kg)    Physical Exam Constitutional:      General: She is not in acute distress.    Appearance: She is well-developed.  HENT:     Head: Normocephalic.     Right Ear:  External ear normal.     Left Ear: External ear normal.     Nose: Nose normal.  Eyes:     General:        Right eye: No discharge.        Left eye: No discharge.     Conjunctiva/sclera: Conjunctivae normal.     Pupils: Pupils are equal, round, and reactive to light.  Neck:     Thyroid: No thyromegaly.     Vascular: No JVD.     Trachea: No tracheal deviation.  Cardiovascular:     Rate and Rhythm: Normal rate and regular rhythm.     Heart sounds: Normal heart sounds.  Pulmonary:     Effort: No respiratory distress.     Breath sounds: No stridor. No wheezing.  Abdominal:     General: Bowel sounds are normal. There is no distension.     Palpations: Abdomen is soft. There is no mass.     Tenderness: There is no abdominal  tenderness. There is no guarding or rebound.  Musculoskeletal:        General: No tenderness.     Cervical back: Normal range of motion and neck supple.  Lymphadenopathy:     Cervical: No cervical adenopathy.  Skin:    Findings: No erythema or rash.  Neurological:     Mental Status: She is oriented to person, place, and time.     Cranial Nerves: No cranial nerve deficit.     Motor: No abnormal muscle tone.     Coordination: Coordination normal.     Deep Tendon Reflexes: Reflexes normal.  Psychiatric:        Behavior: Behavior normal.        Thought Content: Thought content normal.        Judgment: Judgment normal.     Lab Results  Component Value Date   WBC 5.6 10/07/2018   HGB 13.9 10/07/2018   HCT 42.3 10/07/2018   PLT 286 10/07/2018   GLUCOSE 95 10/07/2018   CHOL 163 07/31/2015   TRIG 70.0 07/31/2015   HDL 63.30 07/31/2015   LDLDIRECT 178.9 03/23/2013   LDLCALC 85 07/31/2015   ALT 15 08/18/2018   AST 20 08/18/2018   NA 136 10/07/2018   K 3.9 10/07/2018   CL 101 10/07/2018   CREATININE 0.66 10/07/2018   BUN 13 10/07/2018   CO2 25 10/07/2018   TSH 1.37 10/13/2017   INR 1.03 04/18/2018    No results found.  Assessment & Plan:   There are no diagnoses linked to this encounter.   Meds ordered this encounter  Medications  . famotidine (PEPCID) 40 MG tablet    Sig: Take 1 tablet (40 mg total) by mouth daily.    Dispense:  90 tablet    Refill:  3  . zolpidem (AMBIEN) 10 MG tablet    Sig: Take 0.5-1 tablets (5-10 mg total) by mouth at bedtime as needed. for sleep    Dispense:  90 tablet    Refill:  0    This request is for a new prescription for a controlled substance as required by Federal/State law.     Follow-up: No follow-ups on file.  Walker Kehr, MD

## 2019-07-24 NOTE — Assessment & Plan Note (Signed)
On B12 

## 2019-07-24 NOTE — Assessment & Plan Note (Addendum)
  The patient is off pravastatin Calcium score 0 normal 2020

## 2019-07-24 NOTE — Assessment & Plan Note (Signed)
Pepcid?

## 2019-07-24 NOTE — Assessment & Plan Note (Signed)
On Xarelto 

## 2019-07-25 ENCOUNTER — Other Ambulatory Visit: Payer: PPO

## 2019-07-25 DIAGNOSIS — R8290 Unspecified abnormal findings in urine: Secondary | ICD-10-CM | POA: Diagnosis not present

## 2019-07-27 ENCOUNTER — Other Ambulatory Visit: Payer: Self-pay | Admitting: Internal Medicine

## 2019-07-27 LAB — URINE CULTURE
MICRO NUMBER:: 10516912
SPECIMEN QUALITY:: ADEQUATE

## 2019-07-27 MED ORDER — CIPROFLOXACIN HCL 250 MG PO TABS: 250.0000 mg | ORAL_TABLET | Freq: Two times a day (BID) | ORAL | 0 refills | Status: DC

## 2019-08-14 ENCOUNTER — Encounter: Payer: Self-pay | Admitting: Family Medicine

## 2019-08-14 ENCOUNTER — Ambulatory Visit (INDEPENDENT_AMBULATORY_CARE_PROVIDER_SITE_OTHER): Payer: PPO | Admitting: Family Medicine

## 2019-08-14 ENCOUNTER — Other Ambulatory Visit: Payer: Self-pay

## 2019-08-14 VITALS — BP 94/64 | Temp 97.8°F | Wt 125.0 lb

## 2019-08-14 DIAGNOSIS — G43809 Other migraine, not intractable, without status migrainosus: Secondary | ICD-10-CM

## 2019-08-14 DIAGNOSIS — Z170 Estrogen receptor positive status [ER+]: Secondary | ICD-10-CM | POA: Diagnosis not present

## 2019-08-14 DIAGNOSIS — M26623 Arthralgia of bilateral temporomandibular joint: Secondary | ICD-10-CM | POA: Diagnosis not present

## 2019-08-14 DIAGNOSIS — C50811 Malignant neoplasm of overlapping sites of right female breast: Secondary | ICD-10-CM | POA: Diagnosis not present

## 2019-08-14 DIAGNOSIS — M810 Age-related osteoporosis without current pathological fracture: Secondary | ICD-10-CM

## 2019-08-14 DIAGNOSIS — G4700 Insomnia, unspecified: Secondary | ICD-10-CM | POA: Diagnosis not present

## 2019-08-14 DIAGNOSIS — E538 Deficiency of other specified B group vitamins: Secondary | ICD-10-CM

## 2019-08-14 DIAGNOSIS — K219 Gastro-esophageal reflux disease without esophagitis: Secondary | ICD-10-CM | POA: Diagnosis not present

## 2019-08-14 DIAGNOSIS — E785 Hyperlipidemia, unspecified: Secondary | ICD-10-CM

## 2019-08-14 NOTE — Patient Instructions (Addendum)
Schedule follow up with Dr. Quincy Simmonds. (ask about recurrent UTI/?atrophy related) Temporomandibular Joint Syndrome  Temporomandibular joint syndrome (TMJ syndrome) is a condition that causes pain in the temporomandibular joints. These joints are located near your ears and allow your jaw to open and close. For people with TMJ syndrome, chewing, biting, or other movements of the jaw can be difficult or painful. TMJ syndrome is often mild and goes away within a few weeks. However, sometimes the condition becomes a long-term (chronic) problem. What are the causes? This condition may be caused by:  Grinding your teeth or clenching your jaw. Some people do this when they are under stress.  Arthritis.  Injury to the jaw.  Head or neck injury.  Teeth or dentures that are not aligned well. In some cases, the cause of TMJ syndrome may not be known. What are the signs or symptoms? The most common symptom of this condition is an aching pain on the side of the head in the area of the TMJ. Other symptoms may include:  Pain when moving your jaw, such as when chewing or biting.  Being unable to open your jaw all the way.  Making a clicking sound when you open your mouth.  Headache.  Earache.  Neck or shoulder pain. How is this diagnosed? This condition may be diagnosed based on:  Your symptoms and medical history.  A physical exam. Your health care provider may check the range of motion of your jaw.  Imaging tests, such as X-rays or an MRI. You may also need to see your dentist, who will determine if your teeth and jaw are lined up correctly. How is this treated? TMJ syndrome often goes away on its own. If treatment is needed, the options may include:  Eating soft foods and applying ice or heat.  Medicines to relieve pain or inflammation.  Medicines or massage to relax the muscles.  A splint, bite plate, or mouthpiece to prevent teeth grinding or jaw clenching.  Relaxation techniques  or counseling to help reduce stress.  A therapy for pain in which an electrical current is applied to the nerves through the skin (transcutaneous electrical nerve stimulation).  Acupuncture. This is sometimes helpful to relieve pain.  Jaw surgery. This is rarely needed. Follow these instructions at home:  Eating and drinking  Eat a soft diet if you are having trouble chewing.  Avoid foods that require a lot of chewing. Do not chew gum. General instructions  Take over-the-counter and prescription medicines only as told by your health care provider.  If directed, put ice on the painful area. ? Put ice in a plastic bag. ? Place a towel between your skin and the bag. ? Leave the ice on for 20 minutes, 2-3 times a day.  Apply a warm, wet cloth (warm compress) to the painful area as directed.  Massage your jaw area and do any jaw stretching exercises as told by your health care provider.  If you were given a splint, bite plate, or mouthpiece, wear it as told by your health care provider.  Keep all follow-up visits as told by your health care provider. This is important. Contact a health care provider if:  You are having trouble eating.  You have new or worsening symptoms. Get help right away if:  Your jaw locks open or closed. Summary  Temporomandibular joint syndrome (TMJ syndrome) is a condition that causes pain in the temporomandibular joints. These joints are located near your ears and allow your jaw  to open and close.  TMJ syndrome is often mild and goes away within a few weeks. However, sometimes the condition becomes a long-term (chronic) problem.  Symptoms include an aching pain on the side of the head in the area of the TMJ, pain when chewing or biting, and being unable to open your jaw all the way. You may also make a clicking sound when you open your mouth.  TMJ syndrome often goes away on its own. If treatment is needed, it may include medicines to relieve pain,  reduce inflammation, or relax the muscles. A splint, bite plate, or mouthpiece may also be used to prevent teeth grinding or jaw clenching. This information is not intended to replace advice given to you by your health care provider. Make sure you discuss any questions you have with your health care provider. Document Revised: 04/30/2017 Document Reviewed: 03/30/2017 Elsevier Patient Education  2020 [employer].

## 2019-08-14 NOTE — Progress Notes (Signed)
Brittany [name] DOB: 1945/10/27 Encounter date: 08/14/2019  This isa 74 y.o. female who presents to establish care. Chief Complaint  Patient presents with  . Establish Care    History of present illness: Has had in left ear - clicking type noise and chewing in left jaw is sore. Has been bothering her a  Couple of months. Hasn't had issues with jaw or ear before. No pain in the ear. Hearing ok.   Hasn't had a pap smear since 05/2015. Normal but some inflammation.   Migraine: has these infrequently. Starts right temple. If she feels little bit of pain she takes imitrex and this works. Gets them once a year.   A fib/MVP/Mitral regurg: feels like she has done well with this. Had episode and went to hospital but heart went back into normal rhythm before she even got medications. Usually she cannot feel herself going in or out of rhythm. Hasn't been exercising due to [place].   Seasonal allergies: uses benadryl just as needed.   GERD/esoph stricture: had esoph dilation at one time; dx with esoph reflux at that time.   Due for repeat colonoscopy this year; their office will contact her (she has already called).   Thyroid nodule - noted 2010; imaged in 12/18/08; repeat US in 08/2009 demonstrated stability of nodules. Osteoporosis: on prolia, vitamin D. Last dexa was 05/2019.   Arthritis: not major issue now; better since knee replacement.   B12 def: taking supplement.   Anxiety/depression: taking citalopram. Doesn't feel like she has had depression. Started citalopram to help keep her more even keel.   Dyslipidemia: previously on pravastatin. Had bad reaction with lipitor - muscle and joint pain.   Uses the Lorrin Mais just if she has a lot on her mind and can't relax to sleep - uses a 5 mg. Usually uses the valerian instead.   Hx of breast cancer right breast: has done well since 2009. Last mammogram was 01/2019.   Gait doesn't feel as good as it did prior to knee surgery. Had to go back to  surgery.   Past Medical History:  Diagnosis Date  . A-fib ([place])   . Acid reflux   . Acute cystitis with positive culture 03/06/2014  . Anemia   . Arthritis   . Breast cancer ([place]) 11/23/2007   right, ER/PR -, HER 2 -  . Cancer [facility])    breast right  triple recet neg, lumpect, chemo, XRT  . Esophageal stricture   . Gastritis 10/2007  . History of chemotherapy    neoadjuvant chemo, last dose 03/2008  . History of radiation therapy 07/25/08 -09/11/08   right breast  . Hx of colonic polyps 08/11/2016  . Hyperlipidemia   . Irregular heart beat 10/13/2017   2019  . Migraines    rare  . Osteopenia    Past Surgical History:  Procedure Laterality Date  . ATRIAL FIBRILLATION ABLATION N/A 10/07/2018   Procedure: ATRIAL FIBRILLATION ABLATION;  Surgeon: [name], MD;  Location: [facility];  Service: Cardiovascular;  Laterality: N/A;  . BREAST LUMPECTOMY  06/26/2008   right, CA   XRT, chemo  . COLONOSCOPY    . EYE SURGERY     left eye cataract surgery 12/23/2015  . growth removed per right thigh     as teen - mole  . JOINT REPLACEMENT  12/31/2015   RTK  . QUADRICEPS TENDON REPAIR Right 09/14/2016   Procedure: OPEN REPAIR QUADRICEP TENDON;  Surgeon: [name], MD;  Location: [facility];  Service: Orthopedics;  Laterality: Right;  . TOTAL KNEE ARTHROPLASTY Right 12/31/2015   Procedure: RIGHT TOTAL KNEE ARTHROPLASTY;  Surgeon: [name], MD;  Location: [facility];  Service: Orthopedics;  Laterality: Right;  . UPPER GASTROINTESTINAL ENDOSCOPY     Allergies  Allergen Reactions  . Boniva [Ibandronic Acid] Nausea Only  . Lipitor [Atorvastatin] Other (See Comments)    Muscle and joint pain(s)   Current Meds  Medication Sig  . Biotin 5000 MCG TABS Take 5,000 mcg by mouth daily.  . Cholecalciferol (VITAMIN D) 50 MCG (2000 UT) tablet Take 2,000 Units by mouth daily.  . citalopram (CELEXA) 40 MG tablet TAKE 1 TABLET BY MOUTH EVERY DAY  . CRANBERRY PO Take 15,000 mg by mouth  daily.  [name] denosumab (PROLIA) 60 MG/ML SOLN injection Inject 60 mg into the skin once. Administer in upper arm, thigh, or abdomen  . diphenhydrAMINE (BENADRYL) 25 MG tablet Take 25 mg by mouth every 6 (six) hours as needed.  [name] ELIQUIS 5 MG TABS tablet TAKE 1 TABLET BY MOUTH TWICE A DAY  . famotidine (PEPCID) 40 MG tablet Take 1 tablet (40 mg total) by mouth daily.  . metoprolol tartrate (LOPRESSOR) 25 MG tablet Take 25 mg by mouth 2 (two) times daily.  . Omega-3 Fatty Acids (FISH OIL) 1200 MG CAPS Take 1,200 mg by mouth daily.   . Probiotic Product (PROBIOTIC DAILY PO) Take 1 tablet by mouth daily.  . SUMAtriptan (IMITREX) 100 MG tablet TAKE 1 TABLET BY MOUTH ONCE FOR 1 DOSE. MAY REPEAT IN 2 HOURS IF HEADACHE PERSISTS / RECURS  . VALERIAN ROOT PO Take 1 capsule by mouth at bedtime.  . vitamin B-12 (CYANOCOBALAMIN) 1000 MCG tablet Take 1,000 mcg by mouth daily.  [name] zolpidem (AMBIEN) 10 MG tablet Take 0.5-1 tablets (5-10 mg total) by mouth at bedtime as needed. for sleep   Social History   Tobacco Use  . Smoking status: Former Smoker    Packs/day: 0.25    Years: 5.00    Pack years: 1.25    Types: Cigarettes    Quit date: 03/03/1967    Years since quitting: 52.4  . Smokeless tobacco: Never Used  . Tobacco comment: 6 cig per day when she was smoking/quit years ago  Substance Use Topics  . Alcohol use: Yes    Alcohol/week: 7.0 standard drinks    Types: 7 Cans of beer per week    Comment: 1 beer per day    Family History  Problem Relation Age of Onset  . Heart disease Father   . Cancer Father        lung-nonsmoker  . Breast cancer Sister 45  . Colon cancer Sister 75  . Epilepsy Mother        at older age  . Dementia Mother        ?uncertain type  . Hip fracture Mother        x2  . Other Brother        suicide  . Other Maternal Grandfather        didn't go to doctor     Review of Systems  Constitutional: Negative for chills, fatigue and fever.  Respiratory: Negative for  cough, chest tightness, shortness of breath and wheezing.   Cardiovascular: Negative for chest pain, palpitations and leg swelling.    Objective:  BP 94/64   Temp 97.8 F (36.6 C)   Wt 125 lb (56.7 kg)   LMP 03/02/1994 (Approximate)  BMI 19.58 kg/m   Weight: 125 lb (56.7 kg)   BP Readings from Last 3 Encounters:  08/14/19 94/64  07/24/19 116/70  04/25/19 114/68   Wt Readings from Last 3 Encounters:  08/14/19 125 lb (56.7 kg)  07/24/19 125 lb (56.7 kg)  04/25/19 127 lb 6.4 oz (57.8 kg)    Physical Exam Constitutional:      General: She is not in acute distress.    Appearance: She is well-developed.  HENT:     Right Ear: External ear normal. There is impacted cerumen.     Left Ear: Tympanic membrane, ear canal and external ear normal.     Ears:     Comments: Excessive cerumen right ear canal.  Lighted curette was used to remove some of it, but there is a significant amount.  Encourage patient to use earwax softening drops and we can always try to be flushed on a future visit.    Mouth/Throat:     Lips: Pink.     Mouth: Mucous membranes are moist.     Dentition: Normal dentition.     Tongue: No lesions. Tongue does not deviate from midline.     Pharynx: Oropharynx is clear.     Tonsils: No tonsillar exudate.     Comments: There is significant bilateral TMJ tenderness.  This is worse on the left.  No active popping or grinding of the jaw. Cardiovascular:     Rate and Rhythm: Normal rate and regular rhythm.     Heart sounds: Normal heart sounds. No murmur heard.  No friction rub.  Pulmonary:     Effort: Pulmonary effort is normal. No respiratory distress.     Breath sounds: Normal breath sounds. No wheezing or rales.  Musculoskeletal:     Right lower leg: No edema.     Left lower leg: No edema.  Neurological:     Mental Status: She is alert and oriented to person, place, and time.  Psychiatric:        Behavior: Behavior normal.     Assessment/Plan:  1.  Migraine variant Infrequent migraines that are easily treated with sumatriptan.  Okay for refills if needed.  2. Gastroesophageal reflux disease, unspecified whether esophagitis present Reflux was diagnosed on prior endoscopy.  Patient continues with Pepcid due to this.  She is asymptomatic.  3. Osteoporosis, unspecified osteoporosis type, unspecified pathological fracture presence Currently on Prolia.  Last bone density was March/2021.  4. B12 deficiency Takes oral B12.  5. Insomnia, unspecified type Infrequent use of Benadryl to help with sleep.  Ambien if needed.  6. Malignant neoplasm of overlapping sites of right breast in female, estrogen receptor positive ([place]) Follows up regularly for mammograms.  7. Hyperlipidemia: She stopped pravastatin (no note about why this was stopped).  She has been intolerant to Lipitor in the past.  Using the ASCVD calculator, there is very minimal benefit to her being on a statin at this time.  We reviewed this together.  We will continue to monitor cholesterol and she will work on continuing healthy diet and exercise.  8.  TMJ tenderness: Described TMJ release that patient can perform on herself at home.  Encouraged her to follow-up with the dentist and make sure she is not grinding.  Consider icing jaw to help with discomfort, avoid excessive chewing.   Return in about 6 months (around 02/13/2020) for physical exam.  [name], MD Time of visit, cerumen removal, chart review, charting 45 minutes total time.

## 2019-08-15 NOTE — Telephone Encounter (Signed)
Please advise if ok for pt to have pap done here

## 2019-08-16 NOTE — Telephone Encounter (Signed)
Please set up for physical (if it has not been a year since last "physical" then we can just do a "pap" visit since she is due)

## 2019-08-17 ENCOUNTER — Encounter: Payer: Self-pay | Admitting: Family Medicine

## 2019-08-25 ENCOUNTER — Other Ambulatory Visit: Payer: PPO

## 2019-09-18 ENCOUNTER — Other Ambulatory Visit: Payer: Self-pay

## 2019-09-18 DIAGNOSIS — Z170 Estrogen receptor positive status [ER+]: Secondary | ICD-10-CM

## 2019-09-19 ENCOUNTER — Inpatient Hospital Stay: Payer: PPO | Attending: Oncology

## 2019-09-19 ENCOUNTER — Other Ambulatory Visit: Payer: Self-pay

## 2019-09-19 ENCOUNTER — Inpatient Hospital Stay: Payer: PPO | Admitting: Oncology

## 2019-09-19 VITALS — BP 102/57 | HR 77 | Temp 98.0°F | Resp 18 | Ht 67.0 in | Wt 125.4 lb

## 2019-09-19 DIAGNOSIS — C50811 Malignant neoplasm of overlapping sites of right female breast: Secondary | ICD-10-CM | POA: Diagnosis not present

## 2019-09-19 DIAGNOSIS — Z87891 Personal history of nicotine dependence: Secondary | ICD-10-CM | POA: Diagnosis not present

## 2019-09-19 DIAGNOSIS — Z79899 Other long term (current) drug therapy: Secondary | ICD-10-CM | POA: Insufficient documentation

## 2019-09-19 DIAGNOSIS — M8580 Other specified disorders of bone density and structure, unspecified site: Secondary | ICD-10-CM | POA: Insufficient documentation

## 2019-09-19 DIAGNOSIS — Z8249 Family history of ischemic heart disease and other diseases of the circulatory system: Secondary | ICD-10-CM | POA: Insufficient documentation

## 2019-09-19 DIAGNOSIS — Z803 Family history of malignant neoplasm of breast: Secondary | ICD-10-CM | POA: Insufficient documentation

## 2019-09-19 DIAGNOSIS — Z801 Family history of malignant neoplasm of trachea, bronchus and lung: Secondary | ICD-10-CM | POA: Diagnosis not present

## 2019-09-19 DIAGNOSIS — I4891 Unspecified atrial fibrillation: Secondary | ICD-10-CM | POA: Diagnosis not present

## 2019-09-19 DIAGNOSIS — Z800 Family history of malignant neoplasm of digestive organs: Secondary | ICD-10-CM | POA: Diagnosis not present

## 2019-09-19 DIAGNOSIS — Z818 Family history of other mental and behavioral disorders: Secondary | ICD-10-CM | POA: Insufficient documentation

## 2019-09-19 DIAGNOSIS — E785 Hyperlipidemia, unspecified: Secondary | ICD-10-CM | POA: Diagnosis not present

## 2019-09-19 DIAGNOSIS — Z7901 Long term (current) use of anticoagulants: Secondary | ICD-10-CM | POA: Insufficient documentation

## 2019-09-19 DIAGNOSIS — Z853 Personal history of malignant neoplasm of breast: Secondary | ICD-10-CM | POA: Insufficient documentation

## 2019-09-19 DIAGNOSIS — Z8719 Personal history of other diseases of the digestive system: Secondary | ICD-10-CM | POA: Diagnosis not present

## 2019-09-19 DIAGNOSIS — K219 Gastro-esophageal reflux disease without esophagitis: Secondary | ICD-10-CM | POA: Insufficient documentation

## 2019-09-19 DIAGNOSIS — Z170 Estrogen receptor positive status [ER+]: Secondary | ICD-10-CM

## 2019-09-19 DIAGNOSIS — Z820 Family history of epilepsy and other diseases of the nervous system: Secondary | ICD-10-CM | POA: Diagnosis not present

## 2019-09-19 LAB — CMP (CANCER CENTER ONLY)
ALT: 12 U/L (ref 0–44)
AST: 16 U/L (ref 15–41)
Albumin: 4 g/dL (ref 3.5–5.0)
Alkaline Phosphatase: 89 U/L (ref 38–126)
Anion gap: 9 (ref 5–15)
BUN: 13 mg/dL (ref 8–23)
CO2: 28 mmol/L (ref 22–32)
Calcium: 9.6 mg/dL (ref 8.9–10.3)
Chloride: 102 mmol/L (ref 98–111)
Creatinine: 0.79 mg/dL (ref 0.44–1.00)
GFR, Est AFR Am: >60 mL/min (ref >60)
GFR, Estimated: >60 mL/min (ref >60)
Glucose, Bld: 117 mg/dL — ABNORMAL HIGH (ref 70–99)
Potassium: 4.3 mmol/L (ref 3.5–5.1)
Sodium: 139 mmol/L (ref 135–145)
Total Bilirubin: 0.5 mg/dL (ref 0.3–1.2)
Total Protein: 7.4 g/dL (ref 6.5–8.1)

## 2019-09-19 LAB — CBC WITH DIFFERENTIAL (CANCER CENTER ONLY)
Abs Immature Granulocytes: 0.01 10*3/uL (ref 0.00–0.07)
Basophils Absolute: 0 10*3/uL (ref 0.0–0.1)
Basophils Relative: 1 %
Eosinophils Absolute: 0.3 10*3/uL (ref 0.0–0.5)
Eosinophils Relative: 5 %
HCT: 40.4 % (ref 36.0–46.0)
Hemoglobin: 13.3 g/dL (ref 12.0–15.0)
Immature Granulocytes: 0 %
Lymphocytes Relative: 25 %
Lymphs Abs: 1.3 10*3/uL (ref 0.7–4.0)
MCH: 31.8 pg (ref 26.0–34.0)
MCHC: 32.9 g/dL (ref 30.0–36.0)
MCV: 96.7 fL (ref 80.0–100.0)
Monocytes Absolute: 0.4 10*3/uL (ref 0.1–1.0)
Monocytes Relative: 8 %
Neutro Abs: 3.2 10*3/uL (ref 1.7–7.7)
Neutrophils Relative %: 61 %
Platelet Count: 256 10*3/uL (ref 150–400)
RBC: 4.18 MIL/uL (ref 3.87–5.11)
RDW: 12.6 % (ref 11.5–15.5)
WBC Count: 5.2 10*3/uL (ref 4.0–10.5)
nRBC: 0 % (ref 0.0–0.2)

## 2019-09-19 NOTE — Progress Notes (Signed)
ID: Brittany [name]   DOB: 06-05-1945  MR#: [id number]  NWG#:[id number]  Patient Care Team: Wynn Banker, MD as PCP - General (Family Medicine) Jake Bathe, MD as PCP - Cardiology (Cardiology) Regan Lemming, MD as PCP - Electrophysiology (Cardiology) Beverlyn Mcginness, Valentino Hue, MD as Consulting Physician (Hematology and Oncology) Quintella Reichert, MD as Consulting Physician (Sleep Medicine) OTHER MD:    CHIEF COMPLAINT:  Triple negative Breast Cancer; osteoporosis  CURRENT TREATMENT: Observation; denosumab/Prolia   INTERVAL HISTORY: Brittany [name] returns today for follow-up of her triple negative breast cancer. She continues under observation.  She receives denosumab every 6 months and is due for a dose today. She tolerates this well.   Since her last visit, she underwent bilateral diagnostic mammography with tomography at [facility] on 02/07/2019 showing: breast density category C; no evidence of malignancy in either breast.   She also underwent bone density screening on 05/05/2019 showing a T-score of -2.4, which is considered osteopenic. Prior in 04/2017 was -2.2.   REVIEW OF SYSTEMS: Brittany [name] received the majority of vaccine and tolerated it without any side effects.  All the adults and her family have been vaccinated.  She tried to obtain Prolia through her pharmacy and through her primary care physician but was not able to get it done.  She is very concerned because of worsening osteopenia.  She was not able to tolerate oral bisphosphonates previously.  [place].  When she is in [place] she does go to the gym but with the pandemic even though she has been vaccinated she is reluctant to go to the gym.  Aside from these issues a detailed review of systems today was stable.    HISTORY OF PRESENT ILLNESS: From original  intake note:  The patient noted a mass in her right breast and immediately brought it to Dr. Cherlyn Labella  attention 11/22/2007.  I should note that the patient had a negative routine screening mammogram at [facility] on 07/19/2007.  In the 11/22/2007 study, there was at least a 1.5-cm ill-defined density seen primarily on the right MLO view.  There were a few associated microcalcifications.  By palpation, Dr. Yolanda Bonine felt this was approximately 1.9 cm.  By ultrasound, this was an irregular hypoechoic mass measuring up to 1.9 cm.  There were a few irregular vessels present by Doppler.  There were no abnormal lymph nodes noted.  Breast specific gamma imaging was performed the next day, 11/23/2007, and showed a normal left breast.  On the right, there was a 1.6-cm high-density focus of abnormal isotope activity.  Biopsy of the mass was performed the same day under ultrasound guidance, and showed ([id number]) an intermediate to high-grade invasive ductal carcinoma, which was ER and PR negative with a proliferation marker of 50%.  HER2-neu was negative at 1+.   Her subsequent history is as detailed below.   PAST MEDICAL HISTORY: Past Medical History:  Diagnosis Date  . A-fib (HCC)   . Acid reflux   . Acute cystitis with positive culture 03/06/2014  . Anemia   . Arthritis   . Baker cyst, left 04/06/2016   Aspirated 04/06/2016  . Breast cancer (HCC) 11/23/2007   right, ER/PR -, HER 2 -  . Cancer [facility])    breast right  triple recet neg, lumpect, chemo, XRT  . Cognitive deficits 09/25/2014   2016 Very mild, could be related to meds   .  Degenerative arthritis of left knee 04/06/2016   Injection and aspiration 04/06/2016  . Degenerative arthritis of right knee 09/27/2015  . Esophageal stricture   . ESOPHAGEAL STRICTURE 11/23/2007   Qualifier: Diagnosis of  By: Leone Payor MD, Charlyne Quale Gastritis 10/2007  . History of chemotherapy    neoadjuvant chemo, last dose 03/2008  . History of radiation therapy 07/25/08 -09/11/08   right breast  . Hx of colonic polyps 08/11/2016  . Hyperlipidemia    . Irregular heart beat 10/13/2017   2019  . Migraines    rare  . Myalgia 07/18/2013   5/15 multiple site   . Osteopenia   . Other specified injury of right quadriceps muscle, fascia and tendon, initial encounter 09/14/2016  . Rupture of quadriceps tendon 03/23/2017  Significant for osteopenia, history of hiatal hernia with esophageal stricture, status post upper endoscopy under [facility] 10/10/2007 showing an erosive gastritis in a background of reactive gastropathy ([id number]).  She has a history of rare migraines, and when she was 17, she had a benign growth removed from the back of her right calf.     PAST SURGICAL HISTORY: Past Surgical History:  Procedure Laterality Date  . ATRIAL FIBRILLATION ABLATION N/A 10/07/2018   Procedure: ATRIAL FIBRILLATION ABLATION;  Surgeon: Regan Lemming, MD;  Location: [facility] INVASIVE CV LAB;  Service: Cardiovascular;  Laterality: N/A;  . BREAST LUMPECTOMY  06/26/2008   right, CA   XRT, chemo  . COLONOSCOPY    . EYE SURGERY     left eye cataract surgery 12/23/2015  . growth removed per right thigh     as teen - mole  . JOINT REPLACEMENT  12/31/2015   RTK  . QUADRICEPS TENDON REPAIR Right 09/14/2016   Procedure: OPEN REPAIR QUADRICEP TENDON;  Surgeon: Durene Romans, MD;  Location: [facility] ORS;  Service: Orthopedics;  Laterality: Right;  . TOTAL KNEE ARTHROPLASTY Right 12/31/2015   Procedure: RIGHT TOTAL KNEE ARTHROPLASTY;  Surgeon: Durene Romans, MD;  Location: [facility] ORS;  Service: Orthopedics;  Laterality: Right;  . UPPER GASTROINTESTINAL ENDOSCOPY      FAMILY HISTORY Family History  Problem Relation Age of Onset  . Heart disease Father   . Cancer Father        lung-nonsmoker  . Breast cancer Sister 28  . Colon cancer Sister 83  . Epilepsy Mother        at older age  . Dementia Mother        ?uncertain type  . Hip fracture Mother        x2  . Other Brother        suicide  . Other Maternal Grandfather        didn't go to doctor  The  patient's father died from lung cancer at the age of 68; he was not a smoker.  The patient's mother is alive at age 92; she has epilepsy.  The patient has a sister diagnosed with breast cancer when she was 2.  She is doing well.  The patient has a brother in good health.  There is no other history of breast or ovarian cancer in the family.   GYNECOLOGIC HISTORY: She is GX P3, first pregnancy to term age 63, last menstrual period when she was 74 years old.  She never took any hormones.      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  Their son, Brittany [name], works for [employer], an Electronics engineer, and is working for his [employer]; he has 2 children and lives in [place], right outside of [place].  They have a daughter, Brittany [name], who lives in [place] and has 3 children, and a son, Brittany [name], who just married a young woman from [place] and is now living in [place].  The patient attends the [employer].    ADVANCED DIRECTIVES: In the absence of any documentation to the contrary, the patient's spouse is their HCPOA.    HEALTH MAINTENANCE:   Social History   Tobacco Use  . Smoking status: Former Smoker    Packs/day: 0.25    Years: 5.00    Pack years: 1.25    Types: Cigarettes    Quit date: 03/03/1967    Years since quitting: 52.5  . Smokeless tobacco: Never Used  . Tobacco comment: 6 cig per day when she was smoking/quit years ago  Vaping Use  . Vaping Use: Never used  Substance Use Topics  . Alcohol use: Yes    Alcohol/week: 7.0 standard drinks    Types: 7 Cans of beer per week    Comment: 1 beer per day   . Drug use: No     Colonoscopy:  April 2007/Dr. Gessner  PAP: Not on file  Bone density: 05/25/2017 showed a T score of  -2.2   Lipid panel: January 2015/Dr. Plotnikov   Allergies  Allergen Reactions   . Boniva [Ibandronic Acid] Nausea Only  . Lipitor [Atorvastatin] Other (See Comments)    Muscle and joint pain(s)    Current Outpatient Medications  Medication Sig Dispense Refill  . Biotin 5000 MCG TABS Take 5,000 mcg by mouth daily.    . Cholecalciferol (VITAMIN D) 50 MCG (2000 UT) tablet Take 2,000 Units by mouth daily.    . citalopram (CELEXA) 40 MG tablet TAKE 1 TABLET BY MOUTH EVERY DAY 90 tablet 3  . CRANBERRY PO Take 15,000 mg by mouth daily.    [name] denosumab (PROLIA) 60 MG/ML SOLN injection Inject 60 mg into the skin once. Administer in upper arm, thigh, or abdomen 1.8 mL 1  . diphenhydrAMINE (BENADRYL) 25 MG tablet Take 25 mg by mouth every 6 (six) hours as needed.    [name] ELIQUIS 5 MG TABS tablet TAKE 1 TABLET BY MOUTH TWICE A DAY 60 tablet 5  . famotidine (PEPCID) 40 MG tablet Take 1 tablet (40 mg total) by mouth daily. 90 tablet 3  . metoprolol tartrate (LOPRESSOR) 25 MG tablet Take 25 mg by mouth 2 (two) times daily.    . Omega-3 Fatty Acids (FISH OIL) 1200 MG CAPS Take 1,200 mg by mouth daily.     . Probiotic Product (PROBIOTIC DAILY PO) Take 1 tablet by mouth daily.    . SUMAtriptan (IMITREX) 100 MG tablet TAKE 1 TABLET BY MOUTH ONCE FOR 1 DOSE. MAY REPEAT IN 2 HOURS IF HEADACHE PERSISTS / RECURS 9 tablet 5  . VALERIAN ROOT PO Take 1 capsule by mouth at bedtime.    . vitamin B-12 (CYANOCOBALAMIN) 1000 MCG tablet Take 1,000 mcg by mouth daily.    [name] zolpidem (AMBIEN) 10 MG tablet Take 0.5-1 tablets (5-10 mg total) by mouth at bedtime as needed. for sleep 90 tablet 0   No current facility-administered medications for this visit.    OBJECTIVE: white woman in no acute distress  Vitals:   09/19/19 1326  BP: [name])  102/57  Pulse: 77  Resp: 18  Temp: 98 F (36.7 C)  SpO2: 99%     Body mass index is 19.64 kg/m.    ECOG FS: 0 Filed Weights   09/19/19 1326  Weight: 125 lb 6.4 oz (56.9 kg)    Sclerae unicteric, EOMs intact Wearing a mask No cervical or supraclavicular  adenopathy Lungs no rales or rhonchi Heart regular rate and rhythm Abd soft, nontender, positive bowel sounds MSK no focal spinal tenderness, no upper extremity lymphedema Neuro: nonfocal, well oriented, appropriate affect Breasts: The right breast is status post lumpectomy and radiation.  There is no evidence of local recurrence.  The left breast is benign.  Both axillae are benign.   LAB RESULTS: Lab Results  Component Value Date   WBC 5.2 09/19/2019   NEUTROABS 3.2 09/19/2019   HGB 13.3 09/19/2019   HCT 40.4 09/19/2019   MCV 96.7 09/19/2019   PLT 256 09/19/2019      Chemistry      Component Value Date/Time   NA 139 09/19/2019 1258   NA 136 02/16/2017 1315   K 4.3 09/19/2019 1258   K 4.1 02/16/2017 1315   CL 102 09/19/2019 1258   CL 98 03/21/2012 1053   CO2 28 09/19/2019 1258   CO2 26 02/16/2017 1315   BUN 13 09/19/2019 1258   BUN 10.4 02/16/2017 1315   CREATININE 0.79 09/19/2019 1258   CREATININE 0.7 02/16/2017 1315      Component Value Date/Time   CALCIUM 9.6 09/19/2019 1258   CALCIUM 9.2 02/16/2017 1315   ALKPHOS 89 09/19/2019 1258   ALKPHOS 56 02/16/2017 1315   AST 16 09/19/2019 1258   AST 17 02/16/2017 1315   ALT 12 09/19/2019 1258   ALT 7 02/16/2017 1315   BILITOT 0.5 09/19/2019 1258   BILITOT 0.34 02/16/2017 1315      STUDIES: No results found.   ASSESSMENT: 74 y.o. [place] woman   (1) s/p right breast bopsy 11/2007 for a clinical  T1c N0, stage IA Invasive ductal carcinoma, grade 2-3, triple-negative with an MIB-1 of 50%.  (2) neoadjuvant chemotherapy consisted of docetaxel/ gemcitabine/ bevacizumab followed by doxorubicin/ cyclophosphamide/ bevacizumab as per the NSABP B-40 protocol.  Last bevacizumab dose was January of 2010.   (3) status-post right lumpectomy and sentinel lymph node dissection in April of 2010 showing a complete pathologic response   (4) She completed adjuvant radiation July 2010.   (5)  Osteoporosis: on vit D/ calcium;  received zolendronate  OCT 2013 and SEPT 2014, started prolia/ denosumab 11/30/2013, to be repeated every 6 months  (a) dexa scan 05/15/2013 showed a T score of -2.3 (improved)  (b) DEXA scan 05/13/2015 showed a T score of -2.2 (stable)  (c) DEXA scan 05/25/2017 showed a T score of  -2.2   (d) DEXA scan 05/05/2019 showed a T score of -2.4   PLAN:  Brittany [name] is now 11 years out from definitive surgery for her breast cancer with no evidence of disease recurrence.  This is very favorable.  Her big concern is osteoporosis.  She is nearly there, with very advanced osteopenia.  Today we discussed Boniva and Fosamax.  She did try Boniva previously and she was absolutely unable to tolerate it because of multiple side effects.  She does tolerate Prolia well and it seems to be keeping her osteopenia from getting worse.  Accordingly we will schedule her for Prolia and August (they are going to be in [place] state for the next couple  of weeks visiting their son).  She does not need labs that day since she obtain labs today and her calcium is fine.  She will then have Prolia again in February and August of next year and she will see me again in August of next year  She is already on vitamin D.  I have encouraged her to walk at least 45 minutes at least 5 days a week.  She knows to call for any other issue that may develop before the next visit.  Total encounter time 30 minutes.  Start   Tequisha Maahs, Valentino Hue, MD  09/19/19 2:01 PM Medical Oncology and Hematology [facility] [address] [place], [place] [address] Tel. [phone]    Fax. [phone]   I, Mickie Bail, am acting as scribe for Dr. Valentino Hue. Aijah Lattner.  I, Ruthann Cancer MD, have reviewed the above documentation for accuracy and completeness, and I agree with the above.   *Total Encounter Time as defined by the Centers for Medicare and Medicaid Services includes, in addition to the face-to-face time of a patient  visit (documented in the note above) non-face-to-face time: obtaining and reviewing outside history, ordering and reviewing medications, tests or procedures, care coordination (communications with other health care professionals or caregivers) and documentation in the medical record.

## 2019-09-21 ENCOUNTER — Telehealth: Payer: Self-pay | Admitting: Oncology

## 2019-09-21 NOTE — Telephone Encounter (Signed)
Scheduled appts per 7/20 los. Left voicemail with appt date and time.  

## 2019-09-30 ENCOUNTER — Other Ambulatory Visit: Payer: Self-pay | Admitting: Internal Medicine

## 2019-09-30 ENCOUNTER — Other Ambulatory Visit: Payer: Self-pay | Admitting: Family Medicine

## 2019-09-30 DIAGNOSIS — R42 Dizziness and giddiness: Secondary | ICD-10-CM | POA: Diagnosis not present

## 2019-09-30 DIAGNOSIS — S93401A Sprain of unspecified ligament of right ankle, initial encounter: Secondary | ICD-10-CM | POA: Diagnosis not present

## 2019-09-30 DIAGNOSIS — M25571 Pain in right ankle and joints of right foot: Secondary | ICD-10-CM | POA: Diagnosis not present

## 2019-10-03 ENCOUNTER — Telehealth: Payer: Self-pay | Admitting: Family Medicine

## 2019-10-03 NOTE — Telephone Encounter (Signed)
Left message for patient to schedule Annual Wellness Visit.  Please schedule with Nurse Health Advisor Shannon Crews, RN at [facility]  

## 2019-10-03 NOTE — Telephone Encounter (Signed)
Spoke with a patient she stated to call back next week she was out of town

## 2019-10-04 ENCOUNTER — Telehealth: Payer: Self-pay | Admitting: Cardiology

## 2019-10-04 DIAGNOSIS — R072 Precordial pain: Secondary | ICD-10-CM | POA: Diagnosis not present

## 2019-10-04 DIAGNOSIS — R079 Chest pain, unspecified: Secondary | ICD-10-CM | POA: Diagnosis not present

## 2019-10-04 DIAGNOSIS — Z888 Allergy status to other drugs, medicaments and biological substances status: Secondary | ICD-10-CM | POA: Diagnosis not present

## 2019-10-04 DIAGNOSIS — Z7982 Long term (current) use of aspirin: Secondary | ICD-10-CM | POA: Diagnosis not present

## 2019-10-04 DIAGNOSIS — Z7901 Long term (current) use of anticoagulants: Secondary | ICD-10-CM | POA: Diagnosis not present

## 2019-10-04 DIAGNOSIS — I4891 Unspecified atrial fibrillation: Secondary | ICD-10-CM | POA: Diagnosis not present

## 2019-10-04 NOTE — Telephone Encounter (Signed)
New Message  Dr. Chip Boer from [facility] is calling in to speak with Dr. Marlou Porch or DOD.

## 2019-10-04 NOTE — Telephone Encounter (Signed)
Call received from physician from [facility].  Dr. Lovena Le spoke with physician.  Pt with chest pain.  EKG reassuring.  Plan to discharge with close cardiology follow up.  Pt scheduled to see Dr. Marlou Porch Friday 10/06/2019.

## 2019-10-06 ENCOUNTER — Encounter: Payer: Self-pay | Admitting: Cardiology

## 2019-10-06 ENCOUNTER — Other Ambulatory Visit: Payer: Self-pay

## 2019-10-06 ENCOUNTER — Ambulatory Visit: Payer: PPO | Admitting: Cardiology

## 2019-10-06 ENCOUNTER — Encounter: Payer: Self-pay | Admitting: *Deleted

## 2019-10-06 VITALS — BP 118/72 | HR 74 | Ht 67.0 in | Wt 128.0 lb

## 2019-10-06 DIAGNOSIS — R002 Palpitations: Secondary | ICD-10-CM

## 2019-10-06 DIAGNOSIS — R0789 Other chest pain: Secondary | ICD-10-CM | POA: Diagnosis not present

## 2019-10-06 DIAGNOSIS — R079 Chest pain, unspecified: Secondary | ICD-10-CM | POA: Diagnosis not present

## 2019-10-06 DIAGNOSIS — I480 Paroxysmal atrial fibrillation: Secondary | ICD-10-CM | POA: Diagnosis not present

## 2019-10-06 MED ORDER — METOPROLOL TARTRATE 25 MG PO TABS
12.5000 mg | ORAL_TABLET | Freq: Two times a day (BID) | ORAL | 3 refills | Status: DC
Start: 2019-10-06 — End: 2020-04-01

## 2019-10-06 NOTE — Progress Notes (Signed)
Cardiology Office Note:    Date:  10/06/2019   ID:  Brittany [name], DOB 1945/09/06, MRN [id number]  PCP:  [name], MD  [facility] Cardiologist:  Candee Furbish, MD  [name] Electrophysiologist:  Will Meredith Leeds, MD   Referring MD: [name], MD    History of Present Illness:    Brittany [name] is a 74 y.o. female here for the follow-up of recent chest pain visit from person Brittany [name].  Your EKG was reassuring.  She was discharged with close cardiology follow-up.  Dr. Lovena Le spoke with the physician at outside hospital.  ER doc reommended stress test.   Woke up itching on arms bilaterally, 2 benadry, went back to bed, pushing on chest. Woke up husband and went to hospital. Normal work up. Pressure lasted a hour. Same feeling with   She has been seen by Dr. Curt Bears in the past for evaluation of atrial fibrillation, paroxysmal atrial fibrillation was ablated on 10/07/2018.  CHA2DS2-VASc score of 2.  Has obstructive sleep apnea as well.  She had many brief episodes.  Palpitations for many years.  Past Medical History:  Diagnosis Date  . A-fib ([place])   . Acid reflux   . Acute cystitis with positive culture 03/06/2014  . Anemia   . Arthritis   . Baker cyst, left 04/06/2016   Aspirated 04/06/2016  . Breast cancer ([place]) 11/23/2007   right, ER/PR -, HER 2 -  . Cancer [facility])    breast right  triple recet neg, lumpect, chemo, XRT  . Cognitive deficits 09/25/2014   2016 Very mild, could be related to meds   . Degenerative arthritis of left knee 04/06/2016   Injection and aspiration 04/06/2016  . Degenerative arthritis of right knee 09/27/2015  . Esophageal stricture   . ESOPHAGEAL STRICTURE 11/23/2007   Qualifier: Diagnosis of  By: [name], Dimas Millin Gastritis 10/2007  . History of chemotherapy    neoadjuvant chemo, last dose 03/2008  . History of radiation therapy 07/25/08 -09/11/08   right breast  . Hx of colonic polyps 08/11/2016  . Hyperlipidemia     . Irregular heart beat 10/13/2017   2019  . Migraines    rare  . Myalgia 07/18/2013   5/15 multiple site   . Osteopenia   . Other specified injury of right quadriceps muscle, fascia and tendon, initial encounter 09/14/2016  . Rupture of quadriceps tendon 03/23/2017    Past Surgical History:  Procedure Laterality Date  . ATRIAL FIBRILLATION ABLATION N/A 10/07/2018   Procedure: ATRIAL FIBRILLATION ABLATION;  Surgeon: [name], MD;  Location: [place] CV LAB;  Service: Cardiovascular;  Laterality: N/A;  . BREAST LUMPECTOMY  06/26/2008   right, CA   XRT, chemo  . COLONOSCOPY    . EYE SURGERY     left eye cataract surgery 12/23/2015  . growth removed per right thigh     as teen - mole  . JOINT REPLACEMENT  12/31/2015   RTK  . QUADRICEPS TENDON REPAIR Right 09/14/2016   Procedure: OPEN REPAIR QUADRICEP TENDON;  Surgeon: [name], MD;  Location: [facility];  Service: Orthopedics;  Laterality: Right;  . TOTAL KNEE ARTHROPLASTY Right 12/31/2015   Procedure: RIGHT TOTAL KNEE ARTHROPLASTY;  Surgeon: [name], MD;  Location: [facility];  Service: Orthopedics;  Laterality: Right;  . UPPER GASTROINTESTINAL ENDOSCOPY      Current Medications: Current Meds  Medication Sig  . Biotin 5000 MCG TABS Take  5,000 mcg by mouth daily.  . Cholecalciferol (VITAMIN D) 50 MCG (2000 UT) tablet Take 2,000 Units by mouth daily.  . citalopram (CELEXA) 40 MG tablet TAKE 1 TABLET BY MOUTH EVERY DAY  . CRANBERRY PO Take 15,000 mg by mouth daily.  [name] denosumab (PROLIA) 60 MG/ML SOLN injection Inject 60 mg into the skin once. Administer in upper arm, thigh, or abdomen  . diphenhydrAMINE (BENADRYL) 25 MG tablet Take 25 mg by mouth every 6 (six) hours as needed.  [name] ELIQUIS 5 MG TABS tablet TAKE 1 TABLET BY MOUTH TWICE A DAY  . famotidine (PEPCID) 40 MG tablet Take 1 tablet (40 mg total) by mouth daily.  . meloxicam (MOBIC) 7.5 MG tablet Take 7.5 mg by mouth daily.  . Omega-3 Fatty Acids (FISH OIL) 1200  MG CAPS Take 1,200 mg by mouth daily.   . Probiotic Product (PROBIOTIC DAILY PO) Take 1 tablet by mouth daily.  . SUMAtriptan (IMITREX) 100 MG tablet TAKE 1 TABLET BY MOUTH ONCE FOR 1 DOSE. MAY REPEAT IN 2 HOURS IF HEADACHE PERSISTS / RECURS  . VALERIAN ROOT PO Take 1 capsule by mouth at bedtime.  . vitamin B-12 (CYANOCOBALAMIN) 1000 MCG tablet Take 1,000 mcg by mouth daily.  [name] zolpidem (AMBIEN) 10 MG tablet Take 0.5-1 tablets (5-10 mg total) by mouth at bedtime as needed. for sleep  . [DISCONTINUED] metoprolol tartrate (LOPRESSOR) 25 MG tablet Take 25 mg by mouth 2 (two) times daily.     Allergies:   Boniva [ibandronic acid] and Lipitor [atorvastatin]   Social History   Socioeconomic History  . Marital status: Married    Spouse name: Not on file  . Number of children: 3  . Years of education: Not on file  . Highest education level: Not on file  Occupational History    Employer: RETIRED  Tobacco Use  . Smoking status: Former Smoker    Packs/day: 0.25    Years: 5.00    Pack years: 1.25    Types: Cigarettes    Quit date: 03/03/1967    Years since quitting: 52.6  . Smokeless tobacco: Never Used  . Tobacco comment: 6 cig per day when she was smoking/quit years ago  Vaping Use  . Vaping Use: Never used  Substance and Sexual Activity  . Alcohol use: Yes    Alcohol/week: 7.0 standard drinks    Types: 7 Cans of beer per week    Comment: 1 beer per day   . Drug use: No  . Sexual activity: Yes    Partners: Male    Birth control/protection: Post-menopausal  Other Topics Concern  . Not on file  Social History Narrative   G3, P3, 1st pregnancy age 32, menopause age 50, no HRT   Social Determinants of Health   Financial Resource Strain:   . Difficulty of Paying Living Expenses:   Food Insecurity:   . Worried About Charity fundraiser in the Last Year:   . Arboriculturist in the Last Year:   Transportation Needs:   . Film/video editor (Medical):   [name] Lack of Transportation  (Non-Medical):   Physical Activity:   . Days of Exercise per Week:   . Minutes of Exercise per Session:   Stress:   . Feeling of Stress :   Social Connections:   . Frequency of Communication with Friends and Family:   . Frequency of Social Gatherings with Friends and Family:   . Attends Religious Services:   .  Active Member of Clubs or Organizations:   . Attends Archivist Meetings:   [name] Marital Status:      Family History: The patient's family history includes Breast cancer (age of onset: 69) in her sister; Cancer in her father; Colon cancer (age of onset: 58) in her sister; Dementia in her mother; Epilepsy in her mother; Heart disease in her father; Hip fracture in her mother; Other in her brother and maternal grandfather.  ROS:   Please see the history of present illness.    No fevers chills nausea vomiting syncope bleeding all other systems reviewed and are negative.  EKGs/Labs/Other Studies Reviewed:    The following studies were reviewed today: TTE 17-Jan-2018  Review of the above records today demonstrates:  - Left ventricle: The cavity size was normal. [place] thickness was normal. Systolic function was normal. The estimated ejection fraction was in the range of 55% to 60%. Left ventricular diastolic function parameters were normal. - Aortic valve: Sclerosis without stenosis. - Mitral valve: Leaflets are severely thickened and calcified with posterior leaflet prolapse but only trivial MR. - Atrial septum: A patent foramen ovale cannot be excluded. - Left atrium: The atrium was normal in size.  30 day monitor 01/24/18 - personally reviewed  Brief episode of paroxysmal atrial fibrillation noted with maximum heart rate 163 bpm. Episode lasted approximately 2 minutes.  Occasional ventricular couplets. Occasional PVCs  Slow ventricular run of approximately 79 bpm, 7 beats. Asymptomatic.  Brief 10 beats of paroxysmal atrial tachycardia   EKG:  EKG is   ordered today.  The ekg ordered today demonstrates NSR no changes  Recent Labs: 07/24/2019: TSH 1.44 09/19/2019: ALT 12; BUN 13; Creatinine 0.79; Hemoglobin 13.3; Platelet Count 256; Potassium 4.3; Sodium 139  Recent Lipid Panel    Component Value Date/Time   CHOL 213 (H) 07/24/2019 0946   TRIG 68.0 07/24/2019 0946   HDL 53.90 07/24/2019 0946   CHOLHDL 4 07/24/2019 0946   VLDL 13.6 07/24/2019 0946   LDLCALC 146 (H) 07/24/2019 0946   LDLDIRECT 178.9 03/23/2013 1059    Physical Exam:    VS:  BP 118/72   Pulse 74   Ht 5\' 7"  (1.702 m)   Wt 128 lb (58.1 kg)   LMP 03/02/1994 (Approximate)   SpO2 98%   BMI 20.05 kg/m     Wt Readings from Last 3 Encounters:  10/06/19 128 lb (58.1 kg)  09/19/19 125 lb 6.4 oz (56.9 kg)  08/14/19 125 lb (56.7 kg)     GEN:  Well nourished, well developed in no acute distress, thin HEENT: Normal NECK: No JVD; No carotid bruits LYMPHATICS: No lymphadenopathy CARDIAC: RRR, no murmurs, rubs, gallops, occasional ectopy RESPIRATORY:  Clear to auscultation without rales, wheezing or rhonchi  ABDOMEN: Soft, non-tender, non-distended MUSCULOSKELETAL:  No edema; No deformity , right ankle brace.  SKIN: Warm and dry NEUROLOGIC:  Alert and oriented x 3 PSYCHIATRIC:  Normal affect   ASSESSMENT:    1. Atypical chest pain   2. Palpitations   3. PAF (paroxysmal atrial fibrillation) (HCC)   4. Chest pain, unspecified type    PLAN:    In order of problems listed above:  Atypical chest pain -We will go ahead and get her ordered up for a nuclear stress test, Lexiscan.  She has a ankle brace.  Recent ER visit negative troponin negative EKG.  Paroxysmal atrial fibrillation -Post atrial fibrillation ablation, Dr. Curt Bears.  In apple watch, no evidence of atrial fibrillation  Low normal  blood pressure -Take 12.5mg  twice a day metoprolol tartrate   Medication Adjustments/Labs and Tests Ordered: Current medicines are reviewed at length with the patient  today.  Concerns regarding medicines are outlined above.  Orders Placed This Encounter  Procedures  . MYOCARDIAL PERFUSION IMAGING   Meds ordered this encounter  Medications  . metoprolol tartrate (LOPRESSOR) 25 MG tablet    Sig: Take 0.5 tablets (12.5 mg total) by mouth 2 (two) times daily.    Dispense:  45 tablet    Refill:  3    Pt will call when ready to have filled.  Please d/c any other orders for Lopressor    Patient Instructions  Medication Instructions:  Please decrease your Lopressor to 12.5 mg twice a day.  Continue all other medications as listed.  *If you need a refill on your cardiac medications before your next appointment, please call your pharmacy*  Testing/Procedures: Your physician has requested that you have a lexiscan myoview. For further information please visit [email or web address]. Please follow instruction sheet, as given.  Follow-Up: At [name], you and your health needs are our priority.  As part of our continuing mission to provide you with exceptional heart care, we have created designated Provider Care Teams.  These Care Teams include your primary Cardiologist (physician) and Advanced Practice Providers (APPs -  Physician Assistants and Nurse Practitioners) who all work together to provide you with the care you need, when you need it.  We recommend signing up for the patient portal called "MyChart".  Sign up information is provided on this After Visit Summary.  MyChart is used to connect with patients for Virtual Visits (Telemedicine).  Patients are able to view lab/test results, encounter notes, upcoming appointments, etc.  Non-urgent messages can be sent to your provider as well.   To learn more about what you can do with MyChart, go to [email or web address].    Your next appointment:   6 month(s)  The format for your next appointment:   In Person  Provider:   Candee Furbish, MD   Thank you for choosing [name]!!         Signed, Candee Furbish, MD  10/06/2019 11:24 AM    [place]

## 2019-10-06 NOTE — Patient Instructions (Addendum)
Medication Instructions:  Please decrease your Lopressor to 12.5 mg twice a day.  Continue all other medications as listed.  *If you need a refill on your cardiac medications before your next appointment, please call your pharmacy*  Testing/Procedures: Your physician has requested that you have a lexiscan myoview. For further information please visit [email or web address]. Please follow instruction sheet, as given.  Follow-Up: At [facility], you and your health needs are our priority.  As part of our continuing mission to provide you with exceptional heart care, we have created designated Provider Care Teams.  These Care Teams include your primary Cardiologist (physician) and Advanced Practice Providers (APPs -  Physician Assistants and Nurse Practitioners) who all work together to provide you with the care you need, when you need it.  We recommend signing up for the patient portal called "MyChart".  Sign up information is provided on this After Visit Summary.  MyChart is used to connect with patients for Virtual Visits (Telemedicine).  Patients are able to view lab/test results, encounter notes, upcoming appointments, etc.  Non-urgent messages can be sent to your provider as well.   To learn more about what you can do with MyChart, go to [email or web address].    Your next appointment:   6 month(s)  The format for your next appointment:   In Person  Provider:   Candee Furbish, MD   Thank you for choosing [facility]!!

## 2019-10-10 ENCOUNTER — Inpatient Hospital Stay: Payer: PPO | Attending: Oncology

## 2019-10-10 ENCOUNTER — Other Ambulatory Visit: Payer: Self-pay

## 2019-10-10 VITALS — BP 111/97 | HR 79 | Temp 98.4°F | Resp 18

## 2019-10-10 DIAGNOSIS — I480 Paroxysmal atrial fibrillation: Secondary | ICD-10-CM | POA: Diagnosis not present

## 2019-10-10 DIAGNOSIS — Z853 Personal history of malignant neoplasm of breast: Secondary | ICD-10-CM | POA: Diagnosis not present

## 2019-10-10 DIAGNOSIS — Z87891 Personal history of nicotine dependence: Secondary | ICD-10-CM | POA: Insufficient documentation

## 2019-10-10 DIAGNOSIS — Z820 Family history of epilepsy and other diseases of the nervous system: Secondary | ICD-10-CM | POA: Diagnosis not present

## 2019-10-10 DIAGNOSIS — Z9221 Personal history of antineoplastic chemotherapy: Secondary | ICD-10-CM | POA: Diagnosis not present

## 2019-10-10 DIAGNOSIS — Z818 Family history of other mental and behavioral disorders: Secondary | ICD-10-CM | POA: Diagnosis not present

## 2019-10-10 DIAGNOSIS — E538 Deficiency of other specified B group vitamins: Secondary | ICD-10-CM | POA: Insufficient documentation

## 2019-10-10 DIAGNOSIS — Z7289 Other problems related to lifestyle: Secondary | ICD-10-CM | POA: Diagnosis not present

## 2019-10-10 DIAGNOSIS — Z801 Family history of malignant neoplasm of trachea, bronchus and lung: Secondary | ICD-10-CM | POA: Insufficient documentation

## 2019-10-10 DIAGNOSIS — M8580 Other specified disorders of bone density and structure, unspecified site: Secondary | ICD-10-CM | POA: Insufficient documentation

## 2019-10-10 DIAGNOSIS — Z79899 Other long term (current) drug therapy: Secondary | ICD-10-CM | POA: Insufficient documentation

## 2019-10-10 DIAGNOSIS — M810 Age-related osteoporosis without current pathological fracture: Secondary | ICD-10-CM

## 2019-10-10 DIAGNOSIS — Z923 Personal history of irradiation: Secondary | ICD-10-CM | POA: Diagnosis not present

## 2019-10-10 DIAGNOSIS — Z8249 Family history of ischemic heart disease and other diseases of the circulatory system: Secondary | ICD-10-CM | POA: Diagnosis not present

## 2019-10-10 DIAGNOSIS — K219 Gastro-esophageal reflux disease without esophagitis: Secondary | ICD-10-CM | POA: Insufficient documentation

## 2019-10-10 MED ORDER — DENOSUMAB 60 MG/ML ~~LOC~~ SOSY
PREFILLED_SYRINGE | SUBCUTANEOUS | Status: AC
  Filled 2019-10-10: qty 1

## 2019-10-10 MED ORDER — DENOSUMAB 60 MG/ML ~~LOC~~ SOSY
60.0000 mg | PREFILLED_SYRINGE | Freq: Once | SUBCUTANEOUS | Status: AC
  Administered 2019-10-10: 60 mg via SUBCUTANEOUS

## 2019-10-16 ENCOUNTER — Ambulatory Visit: Payer: PPO | Admitting: Family Medicine

## 2019-10-18 ENCOUNTER — Telehealth (HOSPITAL_COMMUNITY): Payer: Self-pay | Admitting: *Deleted

## 2019-10-18 NOTE — Telephone Encounter (Signed)
Patient given detailed instructions per Myocardial Perfusion Study Information Sheet for the test on 10/23/19 at 10:30. Patient notified to arrive 15 minutes early and that it is imperative to arrive on time for appointment to keep from having the test rescheduled.  If you need to cancel or reschedule your appointment, please call the office within 24 hours of your appointment. . Patient verbalized understanding.Brittany [name]

## 2019-10-23 ENCOUNTER — Other Ambulatory Visit: Payer: Self-pay

## 2019-10-23 ENCOUNTER — Ambulatory Visit (HOSPITAL_COMMUNITY): Payer: PPO | Attending: Cardiology

## 2019-10-23 VITALS — Ht 67.0 in | Wt 128.0 lb

## 2019-10-23 DIAGNOSIS — R9431 Abnormal electrocardiogram [ECG] [EKG]: Secondary | ICD-10-CM | POA: Diagnosis not present

## 2019-10-23 DIAGNOSIS — R0789 Other chest pain: Secondary | ICD-10-CM | POA: Diagnosis not present

## 2019-10-23 DIAGNOSIS — R079 Chest pain, unspecified: Secondary | ICD-10-CM | POA: Diagnosis not present

## 2019-10-23 LAB — MYOCARDIAL PERFUSION IMAGING
LV dias vol: 88 mL (ref 46–106)
LV sys vol: 39 mL
Peak HR: 95 {beats}/min
Rest HR: 76 {beats}/min
SDS: 0
SRS: 2
SSS: 2
TID: 0.95

## 2019-10-23 MED ORDER — TECHNETIUM TC 99M TETROFOSMIN IV KIT
10.4000 | PACK | Freq: Once | INTRAVENOUS | Status: AC | PRN
  Administered 2019-10-23: 10.4 via INTRAVENOUS
  Filled 2019-10-23: qty 11

## 2019-10-23 MED ORDER — REGADENOSON 0.4 MG/5ML IV SOLN
0.4000 mg | Freq: Once | INTRAVENOUS | Status: AC
  Administered 2019-10-23: 0.4 mg via INTRAVENOUS

## 2019-10-23 MED ORDER — TECHNETIUM TC 99M TETROFOSMIN IV KIT
30.9000 | PACK | Freq: Once | INTRAVENOUS | Status: AC | PRN
  Administered 2019-10-23: 30.9 via INTRAVENOUS
  Filled 2019-10-23: qty 31

## 2019-10-24 ENCOUNTER — Encounter: Payer: Self-pay | Admitting: Cardiology

## 2019-10-24 ENCOUNTER — Ambulatory Visit: Payer: PPO | Admitting: Cardiology

## 2019-10-24 VITALS — BP 118/78 | HR 76 | Ht 67.0 in | Wt 126.2 lb

## 2019-10-24 DIAGNOSIS — I480 Paroxysmal atrial fibrillation: Secondary | ICD-10-CM | POA: Diagnosis not present

## 2019-10-24 NOTE — Progress Notes (Signed)
Electrophysiology Office Note   Date:  10/24/2019   ID:  Brittany [name], DOB 09-28-1945, MRN [id number]  PCP:  Wynn Banker, [name]  Cardiologist:  Anne Fu Primary Electrophysiologist:  Regan Lemming, [name]    No chief complaint on file.    History of Present Illness: Brittany [name] is a 74 y.o. female who is being seen today for the evaluation of atrial fibrillation at the request of Plotnikov, Georgina Quint, [name]. Presenting today for electrophysiology evaluation.  She has a history of atrial fibrillation, breast cancer status post lumpectomy, chemo and XRT.  She wore a cardiac monitor that showed a brief episode of atrial fibrillation.  She has had palpitations for many years.  She did have a treadmill test that showed a brief episode of SVT for which she was asymptomatic.  She went to the emergency room 04/18/2018 with rapid atrial fibrillation rates 175.  She had palpitations with racing and pounding.  She does state that she has had an abnormal rhythm for multiple years, but only has just been diagnosed with atrial fibrillation.  She is now status post day of ablation 10/07/2018.  Today, denies symptoms of palpitations, chest pain, shortness of breath, orthopnea, PND, lower extremity edema, claudication, dizziness, presyncope, syncope, bleeding, or neurologic sequela. The patient is tolerating medications without difficulties.  She is doing well.  She has no chest pain or shortness of breath.  [place] itching and chest pressure.  She went to the emergency room and there.  They did a work-up and found no major abnormality.  She returned and saw Dr. Anne Fu who ordered a Myoview which was read as low risk.  She has not had any further episodes of this.   Past Medical History:  Diagnosis Date  . A-fib (H[facility])   . Acid reflux   . Acute cystitis with positive culture 03/06/2014  . Anemia   . Arthritis   . Baker cyst, left 04/06/2016   Aspirated  04/06/2016  . Breast cancer ([facility]) 11/23/2007   right, ER/PR -, HER 2 -  . Cancer [facility])    breast right  triple recet neg, lumpect, chemo, XRT  . Cognitive deficits 09/25/2014   2016 Very mild, could be related to meds   . Degenerative arthritis of left knee 04/06/2016   Injection and aspiration 04/06/2016  . Degenerative arthritis of right knee 09/27/2015  . Esophageal stricture   . ESOPHAGEAL STRICTURE 11/23/2007   Qualifier: Diagnosis of  By: Brittany [name], Charlyne Quale Gastritis 10/2007  . History of chemotherapy    neoadjuvant chemo, last dose 03/2008  . History of radiation therapy 07/25/08 -09/11/08   right breast  . Hx of colonic polyps 08/11/2016  . Hyperlipidemia   . Irregular heart beat 10/13/2017   2019  . Migraines    rare  . Myalgia 07/18/2013   5/15 multiple site   . Osteopenia   . Other specified injury of right quadriceps muscle, fascia and tendon, initial encounter 09/14/2016  . Rupture of quadriceps tendon 03/23/2017   Past Surgical History:  Procedure Laterality Date  . ATRIAL FIBRILLATION ABLATION N/A 10/07/2018   Procedure: ATRIAL FIBRILLATION ABLATION;  Surgeon: Regan Lemming, [name];  [facility];  Service: Cardiovascular;  Laterality: N/A;  . BREAST LUMPECTOMY  06/26/2008   right, CA   XRT, chemo  . COLONOSCOPY    . EYE SURGERY  left eye cataract surgery 12/23/2015  . growth removed per right thigh     as teen - mole  . JOINT REPLACEMENT  12/31/2015   RTK  . QUADRICEPS TENDON REPAIR Right 09/14/2016   Procedure: OPEN REPAIR QUADRICEP TENDON;  Surgeon: Durene Romans, [name];  Location: [facility];  Service: Orthopedics;  Laterality: Right;  . TOTAL KNEE ARTHROPLASTY Right 12/31/2015   Procedure: RIGHT TOTAL KNEE ARTHROPLASTY;  Surgeon: Durene Romans, [name];  Location: [facility];  Service: Orthopedics;  Laterality: Right;  . UPPER GASTROINTESTINAL ENDOSCOPY       Current Outpatient Medications  Medication Sig Dispense Refill  . Biotin 5000 MCG TABS  Take 5,000 mcg by mouth daily.    . Cholecalciferol (VITAMIN D) 50 MCG (2000 UT) tablet Take 2,000 Units by mouth daily.    . citalopram (CELEXA) 40 MG tablet TAKE 1 TABLET BY MOUTH EVERY DAY 90 tablet 1  . CRANBERRY PO Take 15,000 mg by mouth daily.    [name] denosumab (PROLIA) 60 MG/ML SOLN injection Inject 60 mg into the skin once. Administer in upper arm, thigh, or abdomen 1.8 mL 1  . diphenhydrAMINE (BENADRYL) 25 MG tablet Take 25 mg by mouth every 6 (six) hours as needed.    [name] ELIQUIS 5 MG TABS tablet TAKE 1 TABLET BY MOUTH TWICE A DAY 60 tablet 5  . famotidine (PEPCID) 40 MG tablet Take 1 tablet (40 mg total) by mouth daily. 90 tablet 3  . metoprolol tartrate (LOPRESSOR) 25 MG tablet Take 0.5 tablets (12.5 mg total) by mouth 2 (two) times daily. 45 tablet 3  . Omega-3 Fatty Acids (FISH OIL) 1200 MG CAPS Take 1,200 mg by mouth daily.     . Probiotic Product (PROBIOTIC DAILY PO) Take 1 tablet by mouth daily.    . SUMAtriptan (IMITREX) 100 MG tablet TAKE 1 TABLET BY MOUTH ONCE FOR 1 DOSE. MAY REPEAT IN 2 HOURS IF HEADACHE PERSISTS / RECURS 9 tablet 5  . VALERIAN ROOT PO Take 1 capsule by mouth at bedtime.    . vitamin B-12 (CYANOCOBALAMIN) 1000 MCG tablet Take 1,000 mcg by mouth daily.    [name] zolpidem (AMBIEN) 10 MG tablet Take 0.5-1 tablets (5-10 mg total) by mouth at bedtime as needed. for sleep 90 tablet 0   No current facility-administered medications for this visit.    Allergies:   Boniva [ibandronic acid] and Lipitor [atorvastatin]   Social History:  The patient  reports that she quit smoking about 52 years ago. Her smoking use included cigarettes. She has a 1.25 pack-year smoking history. She has never used smokeless tobacco. She reports current alcohol use of about 7.0 standard drinks of alcohol per week. She reports that she does not use drugs.   Family History:  The patient's family history includes Breast cancer (age of onset: 65) in her sister; Cancer in her father; Colon cancer (age  of onset: 41) in her sister; Dementia in her mother; Epilepsy in her mother; Heart disease in her father; Hip fracture in her mother; Other in her brother and maternal grandfather.   ROS:  Please see the history of present illness.   Otherwise, review of systems is positive for none.   All other systems are reviewed and negative.   PHYSICAL EXAM: VS:  BP 118/78   Pulse 76   Ht 5\' 7"  (1.702 m)   Wt 126 lb 3.2 oz (57.2 kg)   LMP 03/02/1994 (Approximate)   SpO2 95%   BMI 19.77 kg/m  ,  BMI Body mass index is 19.77 kg/m. GEN: Well nourished, well developed, in no acute distress  HEENT: normal  Neck: no JVD, carotid bruits, or masses Cardiac: RRR; no murmurs, rubs, or gallops,no edema  Respiratory:  clear to auscultation bilaterally, normal work of breathing GI: soft, nontender, nondistended, + BS MS: no deformity or atrophy  Skin: warm and dry Neuro:  Strength and sensation are intact Psych: euthymic mood, full affect  EKG:  EKG is ordered today.  She has Personal review of the ekg ordered shows sinus rhythm, rate 77, PACs  Recent Labs: 07/24/2019: TSH 1.44 09/19/2019: ALT 12; BUN 13; Creatinine 0.79; Hemoglobin 13.3; Platelet Count 256; Potassium 4.3; Sodium 139    Lipid Panel     Component Value Date/Time   CHOL 213 (H) 07/24/2019 0946   TRIG 68.0 07/24/2019 0946   HDL 53.90 07/24/2019 0946   CHOLHDL 4 07/24/2019 0946   VLDL 13.6 07/24/2019 0946   LDLCALC 146 (H) 07/24/2019 0946   LDLDIRECT 178.9 03/23/2013 1059     Wt Readings from Last 3 Encounters:  10/24/19 126 lb 3.2 oz (57.2 kg)  10/23/19 128 lb (58.1 kg)  10/06/19 128 lb (58.1 kg)      Other studies Reviewed: Additional studies/ records that were reviewed today include: TTE 12/29/17  Review of the above records today demonstrates:  - Left ventricle: The cavity size was normal. [place] thickness was   normal. Systolic function was normal. The estimated ejection   fraction was in the range of 55% to 60%. Left  ventricular   diastolic function parameters were normal. - Aortic valve: Sclerosis without stenosis. - Mitral valve: Leaflets are severely thickened and calcified with   posterior leaflet prolapse but only trivial MR. - Atrial septum: A patent foramen ovale cannot be excluded. - Left atrium:  The atrium was normal in size.  30 day monitor 01/24/18 - personally reviewed  Brief episode of paroxysmal atrial fibrillation noted with maximum heart rate 163 bpm. Episode lasted approximately 2 minutes.  Occasional ventricular couplets. Occasional PVCs  Slow ventricular run of approximately 79 bpm, 7 beats. Asymptomatic.  Brief 10 beats of paroxysmal atrial tachycardia   ASSESSMENT AND PLAN:  1.  Paroxysmal atrial fibrillation: Currently on Eliquis.  Status post AF ablation 10/07/2018.  CHA2DS2-VASc of 2.  She is remained in sinus rhythm without issue.  No changes.  2.  Obstructive sleep apnea: CPAP compliance encouraged.  Current medicines are reviewed at length with the patient today.   The patient does not have concerns regarding her medicines.  The following changes were made today: None  Labs/ tests ordered today include:  Orders Placed This Encounter  Procedures  . EKG 12-Lead    Disposition:   FU with Nereyda Bowler 6 months  Signed, Eugune Sine Jorja Loa, [name]  10/24/2019 9:57 AM     [facility] [address] [place] [address] [phone] (office) [phone] (fax)

## 2019-10-24 NOTE — Patient Instructions (Signed)
Medication Instructions:  Your physician recommends that you continue on your current medications as directed. Please refer to the Current Medication list given to you today.  *If you need a refill on your cardiac medications before your next appointment, please call your pharmacy*   Lab Work: None ordered If you have labs (blood work) drawn today and your tests are completely normal, you will receive your results only by: . MyChart Message (if you have MyChart) OR . A paper copy in the mail If you have any lab test that is abnormal or we need to change your treatment, we will call you to review the results.   Testing/Procedures: None ordered   Follow-Up: At [facility], you and your health needs are our priority.  As part of our continuing mission to provide you with exceptional heart care, we have created designated Provider Care Teams.  These Care Teams include your primary Cardiologist (physician) and Advanced Practice Providers (APPs -  Physician Assistants and Nurse Practitioners) who all work together to provide you with the care you need, when you need it.  We recommend signing up for the patient portal called "MyChart".  Sign up information is provided on this After Visit Summary.  MyChart is used to connect with patients for Virtual Visits (Telemedicine).  Patients are able to view lab/test results, encounter notes, upcoming appointments, etc.  Non-urgent messages can be sent to your provider as well.   To learn more about what you can do with MyChart, go to [email or web address].    Your next appointment:   6 month(s)  The format for your next appointment:   In Person  Provider:   Will Camnitz, MD   Thank you for choosing [facility]!!   Gibbs Naugle, RN [phone]    Other Instructions    

## 2019-11-02 ENCOUNTER — Ambulatory Visit: Payer: PPO | Admitting: Internal Medicine

## 2019-11-07 ENCOUNTER — Other Ambulatory Visit: Payer: Self-pay | Admitting: Cardiology

## 2019-11-07 ENCOUNTER — Other Ambulatory Visit: Payer: Self-pay | Admitting: Podiatry

## 2019-11-07 NOTE — Telephone Encounter (Signed)
Please Advise

## 2019-11-07 NOTE — Telephone Encounter (Signed)
Prescription refill request for Eliquis received.  Last office visit: 10/24/2019, Camnitz Scr: 0.79, 09/19/2019 Age: 74 y.o. Weight: 57.2 kg   Prescription refill sent.

## 2019-11-08 ENCOUNTER — Ambulatory Visit (INDEPENDENT_AMBULATORY_CARE_PROVIDER_SITE_OTHER): Payer: PPO | Admitting: Family Medicine

## 2019-11-08 ENCOUNTER — Encounter: Payer: Self-pay | Admitting: Family Medicine

## 2019-11-08 ENCOUNTER — Other Ambulatory Visit: Payer: Self-pay

## 2019-11-08 ENCOUNTER — Ambulatory Visit (INDEPENDENT_AMBULATORY_CARE_PROVIDER_SITE_OTHER)
Admission: RE | Admit: 2019-11-08 | Discharge: 2019-11-08 | Disposition: A | Discharge status: Home / Self Care | Payer: PPO | Source: Ambulatory Visit | Attending: Family Medicine | Admitting: Family Medicine

## 2019-11-08 VITALS — BP 96/70 | HR 91 | Temp 98.2°F | Ht 67.0 in | Wt 125.4 lb

## 2019-11-08 DIAGNOSIS — M25512 Pain in left shoulder: Secondary | ICD-10-CM

## 2019-11-08 DIAGNOSIS — M25712 Osteophyte, left shoulder: Secondary | ICD-10-CM | POA: Diagnosis not present

## 2019-11-08 DIAGNOSIS — M85412 Solitary bone cyst, left shoulder: Secondary | ICD-10-CM | POA: Diagnosis not present

## 2019-11-08 NOTE — Progress Notes (Signed)
Brittany [name] DOB: February 07, 1946 Encounter date: 11/08/2019  This is a 74 y.o. female who presents with Chief Complaint  Patient presents with  . Shoulder Pain    patient complains of left shoulder pain x6 months, no known injury    History of present illness:  Sleeps on left side - has sleep apnea and uses cpap. Can't sleep on back at all. Not sure what she did. Does feel like it is getting worse. Pain from shoulder down to elbow. Feels it all day. No neck pain. No weakness in hand or arm. She is left handed, no previous injury.   Daughter in law is doc and asked if anyone had checked her for rheumatoid due to hands turning out. No joint swelling she has noted.    Allergies  Allergen Reactions  . Boniva [Ibandronic Acid] Nausea Only  . Lipitor [Atorvastatin] Other (See Comments)    Muscle and joint pain(s)   Current Meds  Medication Sig  . Biotin 5000 MCG TABS Take 5,000 mcg by mouth daily.  . Cholecalciferol (VITAMIN D) 50 MCG (2000 UT) tablet Take 2,000 Units by mouth daily.  . citalopram (CELEXA) 40 MG tablet TAKE 1 TABLET BY MOUTH EVERY DAY  . CRANBERRY PO Take 15,000 mg by mouth daily.  [name] denosumab (PROLIA) 60 MG/ML SOLN injection Inject 60 mg into the skin once. Administer in upper arm, thigh, or abdomen  . diphenhydrAMINE (BENADRYL) 25 MG tablet Take 25 mg by mouth every 6 (six) hours as needed.  [name] ELIQUIS 5 MG TABS tablet TAKE 1 TABLET BY MOUTH TWICE A DAY  . famotidine (PEPCID) 40 MG tablet Take 1 tablet (40 mg total) by mouth daily.  . metoprolol tartrate (LOPRESSOR) 25 MG tablet Take 0.5 tablets (12.5 mg total) by mouth 2 (two) times daily.  . Omega-3 Fatty Acids (FISH OIL) 1200 MG CAPS Take 1,200 mg by mouth daily.   . Probiotic Product (PROBIOTIC DAILY PO) Take 1 tablet by mouth daily.  . SUMAtriptan (IMITREX) 100 MG tablet TAKE 1 TABLET BY MOUTH ONCE FOR 1 DOSE. MAY REPEAT IN 2 HOURS IF HEADACHE PERSISTS / RECURS  . VALERIAN ROOT PO Take 1 capsule by mouth at bedtime.   . vitamin B-12 (CYANOCOBALAMIN) 1000 MCG tablet Take 1,000 mcg by mouth daily.  [name] zolpidem (AMBIEN) 10 MG tablet Take 0.5-1 tablets (5-10 mg total) by mouth at bedtime as needed. for sleep    Review of Systems  Constitutional: Negative for chills, fatigue and fever.  Respiratory: Negative for cough, chest tightness, shortness of breath and wheezing.   Cardiovascular: Negative for chest pain, palpitations and leg swelling.  Musculoskeletal: Positive for arthralgias.    Objective:  BP 96/70 (BP Location: Left Arm, Patient Position: Sitting, Cuff Size: Normal)   Pulse 91   Temp 98.2 F (36.8 C) (Oral)   Ht 5\' 7"  (1.702 m)   Wt 125 lb 6.4 oz (56.9 kg)   LMP 03/02/1994 (Approximate)   BMI 19.64 kg/m   Weight: 125 lb 6.4 oz (56.9 kg)   BP Readings from Last 3 Encounters:  11/08/19 96/70  10/24/19 118/78  10/10/19 (!) 111/97   Wt Readings from Last 3 Encounters:  11/08/19 125 lb 6.4 oz (56.9 kg)  10/24/19 126 lb 3.2 oz (57.2 kg)  10/23/19 128 lb (58.1 kg)    Physical Exam Constitutional:      General: She is not in acute distress.    Appearance: She is well-developed.  Cardiovascular:     Rate  and Rhythm: Normal rate and regular rhythm.     Heart sounds: Normal heart sounds. No murmur heard.  No friction rub.  Pulmonary:     Effort: Pulmonary effort is normal. No respiratory distress.     Breath sounds: Normal breath sounds. No wheezing or rales.  Musculoskeletal:     Right lower leg: No edema.     Left lower leg: No edema.     Comments: Full ROM of bilat shoulders. There is +hawkins on testing. There is no notable weakness with testing of shoulder strength. No bony tenderness to palpation.   She does have some first MCP joint enlargement, but no joint tenderness in hands. There is some medial deviation of fingers noted.   Neurological:     Mental Status: She is alert and oriented to person, place, and time.     Deep Tendon Reflexes:     Reflex Scores:      Tricep  reflexes are 2+ on the right side and 2+ on the left side.      Bicep reflexes are 2+ on the right side and 2+ on the left side.      Brachioradialis reflexes are 2+ on the right side and 2+ on the left side. Psychiatric:        Behavior: Behavior normal.     Assessment/Plan  1. Acute pain of left shoulder Start with xray; further eval/treatment pending result. Pain has been present for a long time. She is unable to adjust sleeping side due to cpap, which likely is worsening situation/or not allowing healing/resolution of pain.  - DG Shoulder Left; Future    Return for pending xray.     [name], MD

## 2019-11-08 NOTE — Telephone Encounter (Signed)
Should not have to take at this time

## 2019-11-09 ENCOUNTER — Other Ambulatory Visit: Payer: Self-pay | Admitting: *Deleted

## 2019-11-09 DIAGNOSIS — M25512 Pain in left shoulder: Secondary | ICD-10-CM

## 2019-11-21 ENCOUNTER — Encounter: Payer: Self-pay | Admitting: Orthopaedic Surgery

## 2019-11-21 ENCOUNTER — Ambulatory Visit: Payer: Self-pay

## 2019-11-21 ENCOUNTER — Ambulatory Visit: Payer: PPO | Admitting: Orthopaedic Surgery

## 2019-11-21 VITALS — Ht 66.5 in | Wt 125.8 lb

## 2019-11-21 DIAGNOSIS — M25512 Pain in left shoulder: Secondary | ICD-10-CM | POA: Diagnosis not present

## 2019-11-21 DIAGNOSIS — G8929 Other chronic pain: Secondary | ICD-10-CM

## 2019-11-21 NOTE — Progress Notes (Signed)
Subjective: Patient is here for ultrasound-guided intra-articular left glenohumeral injection.  Pain with any movement.   Objective:  Full ROM, no adhesive capsulitis.  Procedure: Ultrasound-guided left glenohumeral injection: After sterile prep with Betadine, injected 8 cc 1% lidocaine without epinephrine and 40 mg methylprednisolone using a 22-gauge spinal needle, passing the needle from posterior approach into the glenohumeral joint.  Injectate was seen filling the joint capsule.  Mild immediate relief.

## 2019-11-21 NOTE — Progress Notes (Signed)
Office Visit Note   Patient: Brittany [name]           Date of Birth: 08/28/1945           MRN: [id number] Visit Date: 11/21/2019              Requested by: [name], MD [place],  [place] [id number] PCP: [name], MD   Assessment & Plan: Visit Diagnoses:  1. 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 After discussion of options she would like to try cortisone injection today. I have also recommended Voltaren gel to try. Follow-up as needed.  Follow-Up Instructions: Return if symptoms worsen or fail to improve.   Orders:  No orders of the defined types were placed in this encounter.  No orders of the defined types were placed in this encounter.     Procedures: No procedures performed   Clinical Data: No additional findings.   Subjective: Chief Complaint  Patient presents with  . Left Shoulder - Pain    Brittany [name] is a 74 year old female referral from PCP for chronic left shoulder pain for 6 months without any injuries. Recent x-ray shows some mild osteoarthritis. Advil and naproxen do help. She has pain when sleeping on the left side at night. Has not had any injections. Denies any numbness and tingling. Feels like the pain is gotten worse and all movement of the shoulder hurts. Denies any weakness.   Review of Systems  Constitutional: Negative.   HENT: Negative.   Eyes: Negative.   Respiratory: Negative.   Cardiovascular: Negative.   Endocrine: Negative.   Musculoskeletal: Negative.   Neurological: Negative.   Hematological: Negative.   Psychiatric/Behavioral: Negative.   All other systems reviewed and are negative.    Objective: Vital Signs: Ht 5' 6.5" (1.689 m)   Wt 125 lb 12.8 oz (57.1 kg)   LMP 03/02/1994 (Approximate)   BMI 20.00 kg/m   Physical Exam Vitals and nursing note reviewed.  Constitutional:      Appearance: She is well-developed.  HENT:      Head: Normocephalic and atraumatic.  Pulmonary:     Effort: Pulmonary effort is normal.  Abdominal:     Palpations: Abdomen is soft.  Musculoskeletal:     Cervical back: Neck supple.  Skin:    General: Skin is warm.     Capillary Refill: Capillary refill takes less than 2 seconds.  Neurological:     Mental Status: She is alert and oriented to person, place, and time.  Psychiatric:        Behavior: Behavior normal.        Thought Content: Thought content normal.        Judgment: Judgment normal.     Ortho Exam Left shoulder shows full range of motion with mild pain. Manual muscle testing of the rotator cuff is normal with mild pain. Specialty Comments:  No specialty comments available.  Imaging: No results found.   PMFS History: Patient Active Problem List   Diagnosis Date Noted  . Atrial fibrillation ([place]) 05/13/2018  . Bloating 04/11/2018  . Abnormal EKG 08/23/2017  . Malignant neoplasm of overlapping sites of right breast in female, estrogen receptor positive ([place]) 08/17/2016  . Hx of colonic polyps 08/11/2016  . Iron deficiency anemia 07/28/2016  . Blood donor 07/28/2016  . Pain due to total right knee replacement ([place]) 04/06/2016  . 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  Baker's cyst, unruptured 09/27/2015  . Insomnia 07/31/2015  . Onychomycosis 09/25/2014  . Urinary frequency 05/21/2014  . Mitral valve prolapse 05/08/2014  . Mitral regurgitation 05/08/2014  . Dyslipidemia 02/09/2014  . History of radiation therapy   . Hiatal hernia   . History of chemotherapy   . Osteoporosis 03/21/2012  . Seasonal and perennial allergic rhinitis 12/17/2011  . B12 deficiency 10/09/2009  . TOBACCO USE, QUIT 01/31/2009  . THYROID NODULE 12/25/2008  . GERD 11/23/2007  . VARICOSE VEIN 12/29/2006  . Anxiety state 12/23/2006  . Depression 12/23/2006  . Migraine variant 12/23/2006  . Disorder of bone and cartilage 12/23/2006   Past Medical History:  Diagnosis Date  . A-fib ([place])     . Acid reflux   . Acute cystitis with positive culture 03/06/2014  . Anemia   . Arthritis   . Baker cyst, left 04/06/2016   Aspirated 04/06/2016  . Breast cancer ([place]) 11/23/2007   right, ER/PR -, HER 2 -  . Cancer [facility])    breast right  triple recet neg, lumpect, chemo, XRT  . Cognitive deficits 09/25/2014   2016 Very mild, could be related to meds   . Degenerative arthritis of left knee 04/06/2016   Injection and aspiration 04/06/2016  . Degenerative arthritis of right knee 09/27/2015  . Esophageal stricture   . ESOPHAGEAL STRICTURE 11/23/2007   Qualifier: Diagnosis of  By: [name], Dimas Millin Gastritis 10/2007  . History of chemotherapy    neoadjuvant chemo, last dose 03/2008  . History of radiation therapy 07/25/08 -09/11/08   right breast  . Hx of colonic polyps 08/11/2016  . Hyperlipidemia   . Irregular heart beat 10/13/2017   2019  . Migraines    rare  . Myalgia 07/18/2013   5/15 multiple site   . Osteopenia   . Other specified injury of right quadriceps muscle, fascia and tendon, initial encounter 09/14/2016  . Rupture of quadriceps tendon 03/23/2017    Family History  Problem Relation Age of Onset  . Heart disease Father   . Cancer Father        lung-nonsmoker  . Breast cancer Sister 76  . Colon cancer Sister 43  . Epilepsy Mother        at older age  . Dementia Mother        ?uncertain type  . Hip fracture Mother        x2  . Other Brother        suicide  . Other Maternal Grandfather        didn't go to doctor    Past Surgical History:  Procedure Laterality Date  . ATRIAL FIBRILLATION ABLATION N/A 10/07/2018   Procedure: ATRIAL FIBRILLATION ABLATION;  Surgeon: [name], MD;  Location: [facility];  Service: Cardiovascular;  Laterality: N/A;  . BREAST LUMPECTOMY  06/26/2008   right, CA   XRT, chemo  . COLONOSCOPY    . EYE SURGERY     left eye cataract surgery 12/23/2015  . growth removed per right thigh     as teen - mole  . JOINT  REPLACEMENT  12/31/2015   RTK  . QUADRICEPS TENDON REPAIR Right 09/14/2016   Procedure: OPEN REPAIR QUADRICEP TENDON;  Surgeon: [name], MD;  Location: [facility];  Service: Orthopedics;  Laterality: Right;  . TOTAL KNEE ARTHROPLASTY Right 12/31/2015   Procedure: RIGHT TOTAL KNEE ARTHROPLASTY;  Surgeon: [name], MD;  Location: [facility];  Service: Orthopedics;  Laterality: Right;  . UPPER GASTROINTESTINAL ENDOSCOPY     Social History   Occupational History    Employer: RETIRED  Tobacco Use  . Smoking status: Former Smoker    Packs/day: 0.25    Years: 5.00    Pack years: 1.25    Types: Cigarettes    Quit date: 03/03/1967    Years since quitting: 52.7  . Smokeless tobacco: Never Used  . Tobacco comment: 6 cig per day when she was smoking/quit years ago  Vaping Use  . Vaping Use: Never used  Substance and Sexual Activity  . Alcohol use: Yes    Alcohol/week: 7.0 standard drinks    Types: 7 Cans of beer per week    Comment: 1 beer per day   . Drug use: No  . Sexual activity: Yes    Partners: Male    Birth control/protection: Post-menopausal

## 2019-12-06 ENCOUNTER — Telehealth: Payer: Self-pay | Admitting: Cardiology

## 2019-12-06 ENCOUNTER — Other Ambulatory Visit: Payer: Self-pay | Admitting: Cardiology

## 2019-12-06 MED ORDER — APIXABAN 5 MG PO TABS
5.0000 mg | ORAL_TABLET | Freq: Two times a day (BID) | ORAL | 1 refills | Status: DC
Start: 2019-12-06 — End: 2020-06-17

## 2019-12-06 NOTE — Telephone Encounter (Signed)
**Note De-Identified  Obfuscation** The pt states that she will not qualify for asst for her Eliquis with [employer] as her income exceeds their limit.  We discussed her switching from Eliquis to Xarelto if Dr Curt Bears and/or our pharmacists are in agreement with change as there is a program called Gwendel Hanson that only requires her applying over the phone but will cost $85 a month.  She wants to read up on Xarelto, s/w her husband and [facility] concerning [facility] and states that she will call me back if she wants to switch.

## 2019-12-06 NOTE — Telephone Encounter (Signed)
Message routed to [facility], anticoagulation clinic does not keep samples of any DOACs.  Called pt Eliquis was refilled at her pharmacy on 11/07/19, she does not need a refill, she states she is in the donut hole and Eliquis went from $45/mo to $123/month.  Pt is requesting samples, not a refill which she currently has on file at pharmacy.  Please check for samples and return call to pt.

## 2019-12-06 NOTE — Telephone Encounter (Signed)
    Pt is calling back and would like to speak with Jeani Hawking again

## 2019-12-06 NOTE — Telephone Encounter (Signed)
Hey Lynn, LPN, can you please advise on this matter? Thanks  ?

## 2019-12-06 NOTE — Telephone Encounter (Signed)
   Patient calling the office for samples of medication:   1.  What medication and dosage are you requesting samples for? ELIQUIS 5 MG TABS tablet  2.  Are you currently out of this medication? Yes, pt is in donut hole and need samples. She ask if she can get for 90 days supply

## 2019-12-06 NOTE — Telephone Encounter (Signed)
*  STAT* If patient is at the pharmacy, call can be transferred to refill team.   1. Which medications need to be refilled? (please list name of each medication and dose if known)   ELIQUIS 5 MG TABS tablet     2. Which pharmacy/location (including street and city if local pharmacy) is medication to be sent to? [facility] [place] [place] - [address]  3. Do they need a 30 day or 90 day supply? 90 day supply

## 2019-12-06 NOTE — Telephone Encounter (Signed)
**Note De-Identified  Obfuscation** The pt states that her Eliquis has gone up to $147/ 30 day supply and that she is in the donut hole.  She states that she currently has 10 days of Eliquis on hand.  We discussed her applying for Pt Asst through [employer] and she is interested. I gave her [employer]'s phone number and advised her to call them with questions concerning their Eliquis program and that if it seems that she is eligible for approval to request that they mail her an application in the mail.  She is aware that once she receives the application to compete her part, obtain required documents per [employer], and to bring all to Dr Camnitz/Dr Marlou Porch office to drop off and that we will take care of the provider part and will fax all to [employer].  Also, she is advised that if she needs samples by the time she is finished her part of the application and is ready to drop all off at the office to call us and we will leave her 2 boxes of Eliquis 5 mg samples (if we have them) to pick up when she drops off her application.  She is aware to call Jeani Hawking Back if she dosent think she will qualify for the assistance program through [employer] so we can discuss other options to lower the cost.  She thanked me for calling her to discuss pt asst.

## 2019-12-06 NOTE — Telephone Encounter (Signed)
Prescription refill request for Eliquis received.  Last office visit: 10/24/2019, Camnitz Scr: 0.79, 09/19/2019 Age: 74 y.o. Weight: 57.2 kg

## 2019-12-06 NOTE — Telephone Encounter (Signed)
Called pt back and pt stated that she needed a 90 day supply sent to her pharmacy instead of a 30 day. Please address

## 2019-12-13 DIAGNOSIS — G4733 Obstructive sleep apnea (adult) (pediatric): Secondary | ICD-10-CM | POA: Diagnosis not present

## 2019-12-14 ENCOUNTER — Ambulatory Visit: Payer: PPO | Admitting: Cardiology

## 2019-12-15 DIAGNOSIS — S93504A Unspecified sprain of right lesser toe(s), initial encounter: Secondary | ICD-10-CM | POA: Diagnosis not present

## 2019-12-18 ENCOUNTER — Telehealth: Payer: Self-pay | Admitting: Cardiology

## 2019-12-18 NOTE — Telephone Encounter (Signed)
2 weeks of Eliquis samples left out front for the patient.

## 2019-12-18 NOTE — Telephone Encounter (Signed)
Patient calling the office for samples of medication: ° ° °1.  What medication and dosage are you requesting samples for? ° °apixaban (ELIQUIS) 5 MG TABS tablet ° °2.  Are you currently out of this medication?  yes ° ° °

## 2019-12-18 NOTE — Telephone Encounter (Signed)
2 weeks worth of samples placed out front for patient to pick up.

## 2019-12-18 NOTE — Telephone Encounter (Signed)
Pt has been notified 2 weeks of Eliquis samples have been placed up at the front desk for pick up. Pt thanked me for the call. Pt states she researched about Xarelto (see previous phone note) and feels she would like to remain on Eliquis. I assured her that I will let her cardiologist know she prefers to stay on Eliquis. Pt thanked me for the call back.

## 2019-12-19 ENCOUNTER — Telehealth: Payer: Self-pay

## 2019-12-19 ENCOUNTER — Ambulatory Visit: Payer: PPO | Admitting: Internal Medicine

## 2019-12-19 ENCOUNTER — Encounter: Payer: Self-pay | Admitting: Internal Medicine

## 2019-12-19 VITALS — BP 98/60 | HR 64 | Ht 66.5 in | Wt 127.0 lb

## 2019-12-19 DIAGNOSIS — I4891 Unspecified atrial fibrillation: Secondary | ICD-10-CM | POA: Diagnosis not present

## 2019-12-19 DIAGNOSIS — Z7901 Long term (current) use of anticoagulants: Secondary | ICD-10-CM | POA: Diagnosis not present

## 2019-12-19 DIAGNOSIS — Z86010 Personal history of colonic polyps: Secondary | ICD-10-CM | POA: Diagnosis not present

## 2019-12-19 NOTE — Telephone Encounter (Signed)
   Primary Cardiologist: Candee Furbish, MD  Chart reviewed as part of pre-operative protocol coverage. We have been asked for guidance to hold eliquis, not medical clearance.  Per our clinical pharmacist: Patient with diagnosis of afib on Eliquis for anticoagulation.    Procedure: colonoscopy Date of procedure: 02/07/20   :[id number]}  CHA2DS2-VASc Score = 2  This indicates a 2.2% annual risk of stroke. The patient's score is based upon: CHF History: 0 HTN History: 0 Diabetes History: 0 Stroke History: 0 Vascular Disease History: 0 Age Score: 1 Gender Score: 1  CrCl 23mL/min Platelet count 256K  Per office protocol, patient can hold Eliquis for 2 days prior to procedure as requested    I will route this recommendation to the requesting party via [facility] fax function and remove from pre-op pool. Please call with questions.  Tami Lin Valine Drozdowski, PA 12/19/2019, 2:50 PM

## 2019-12-19 NOTE — Telephone Encounter (Signed)
[facility] Pre-operative Risk Assessment     Request for surgical clearance:     Endoscopy Procedure  What type of surgery is being performed?     colonoscopy  When is this surgery scheduled?     02/07/2020  What type of clearance is required ?   Pharmacy  Are there any medications that need to be held prior to surgery and how long? Eliquis, 2 days  Practice name and name of physician performing surgery?      [facility]  What is your office phone and fax number?      Phone- [phone]  [phone]  Anesthesia type (None, local, MAC, general) ?       MAC

## 2019-12-19 NOTE — Progress Notes (Signed)
Brittany [name] 74 y.o. [birth year] [id number]  Assessment & Plan:   Encounter Diagnoses  Name Primary?  [name] Hx of colonic polyps Yes  . Long term current use of anticoagulant therapy   . Atrial fibrillation, unspecified type [facility])     Schedule surveillance colonoscopy. Endoscopy risks please. Rare but real risk of stroke off Eliquis explained to the patient.   Subjective:   Chief Complaint: History of colon polyps takes Eliquis  HPI 74 year old white woman on Eliquis for atrial fibrillation (followed by Dr. Marlou Porch and [facility]) with a family history of colon cancer and prior colonoscopy in 2018 demonstrating 6 subcentimeter sessile serrated polyps.  Here to discuss having repeat colonoscopy. Lites. Allergies  Allergen Reactions  . Boniva [Ibandronic Acid] Nausea Only  . Lipitor [Atorvastatin] Other (See Comments)    Muscle and joint pain(s)   Current Meds  Medication Sig  . apixaban (ELIQUIS) 5 MG TABS tablet Take 1 tablet (5 mg total) by mouth 2 (two) times daily.  . Biotin 5000 MCG TABS Take 5,000 mcg by mouth daily.  . Cholecalciferol (VITAMIN D) 50 MCG (2000 UT) tablet Take 2,000 Units by mouth daily.  . citalopram (CELEXA) 40 MG tablet TAKE 1 TABLET BY MOUTH EVERY DAY  . CRANBERRY PO Take 15,000 mg by mouth daily.  [name] denosumab (PROLIA) 60 MG/ML SOLN injection Inject 60 mg into the skin once. Administer in upper arm, thigh, or abdomen  . diphenhydrAMINE (BENADRYL) 25 MG tablet Take 25 mg by mouth every 6 (six) hours as needed.  . famotidine (PEPCID) 40 MG tablet Take 1 tablet (40 mg total) by mouth daily.  . metoprolol tartrate (LOPRESSOR) 25 MG tablet Take 0.5 tablets (12.5 mg total) by mouth 2 (two) times daily.  . Omega-3 Fatty Acids (FISH OIL) 1200 MG CAPS Take 1,200 mg by mouth daily.   . Probiotic Product (PROBIOTIC DAILY PO) Take 1 tablet by mouth daily.  . SUMAtriptan (IMITREX) 100 MG tablet TAKE 1 TABLET BY MOUTH ONCE FOR 1 DOSE. MAY REPEAT IN 2 HOURS IF HEADACHE  PERSISTS / RECURS  . VALERIAN ROOT PO Take 1 capsule by mouth at bedtime.  . vitamin B-12 (CYANOCOBALAMIN) 1000 MCG tablet Take 1,000 mcg by mouth daily.  [name] zolpidem (AMBIEN) 10 MG tablet Take 0.5-1 tablets (5-10 mg total) by mouth at bedtime as needed. for sleep   Past Medical History:  Diagnosis Date  . A-fib ([place])   . Acid reflux   . Acute cystitis with positive culture 03/06/2014  . Anemia   . Arthritis   . Baker cyst, left 04/06/2016   Aspirated 04/06/2016  . Breast cancer ([place]) 11/23/2007   right, ER/PR -, HER 2 -  . Cancer [facility])    breast right  triple recet neg, lumpect, chemo, XRT  . Cognitive deficits 09/25/2014   2016 Very mild, could be related to meds   . Degenerative arthritis of left knee 04/06/2016   Injection and aspiration 04/06/2016  . Degenerative arthritis of right knee 09/27/2015  . Esophageal stricture   . ESOPHAGEAL STRICTURE 11/23/2007   Qualifier: Diagnosis of  By: [name], Dimas Millin Gastritis 10/2007  . History of chemotherapy    neoadjuvant chemo, last dose 03/2008  . History of radiation therapy 07/25/08 -09/11/08   right breast  . Hx of colonic polyps 08/11/2016  . Hyperlipidemia   . Irregular heart beat 10/13/2017   2019  . Migraines    rare  . Myalgia 07/18/2013  5/15 multiple site   . Osteopenia   . Other specified injury of right quadriceps muscle, fascia and tendon, initial encounter 09/14/2016  . Rupture of quadriceps tendon 03/23/2017   Past Surgical History:  Procedure Laterality Date  . ATRIAL FIBRILLATION ABLATION N/A 10/07/2018   Procedure: ATRIAL FIBRILLATION ABLATION;  Surgeon: [name], MD;  Location: [place] CV LAB;  Service: Cardiovascular;  Laterality: N/A;  . BREAST LUMPECTOMY  06/26/2008   right, CA   XRT, chemo  . COLONOSCOPY    . EYE SURGERY     left eye cataract surgery 12/23/2015  . growth removed per right thigh     as teen - mole  . JOINT REPLACEMENT  12/31/2015   RTK  . QUADRICEPS TENDON REPAIR Right  09/14/2016   Procedure: OPEN REPAIR QUADRICEP TENDON;  Surgeon: [name], MD;  Location: [facility];  Service: Orthopedics;  Laterality: Right;  . TOTAL KNEE ARTHROPLASTY Right 12/31/2015   Procedure: RIGHT TOTAL KNEE ARTHROPLASTY;  Surgeon: [name], MD;  Location: [facility];  Service: Orthopedics;  Laterality: Right;  . UPPER GASTROINTESTINAL ENDOSCOPY     Social History   Social History Narrative   G3, P3, 1st pregnancy age 49, menopause age 60, no HRT   family history includes Breast cancer (age of onset: 42) in her sister; Cancer in her father; Colon cancer (age of onset: 74) in her sister; Dementia in her mother; Epilepsy in her mother; Heart disease in her father; Hip fracture in her mother; Other in her brother and maternal grandfather.   Review of Systems As per HPI Objective:   Physical Exam BP 98/60 (BP Location: Left Arm, Patient Position: Sitting, Cuff Size: Normal)   Pulse 64   Ht 5' 6.5" (1.689 m)   Wt 127 lb (57.6 kg)   LMP 03/02/1994 (Approximate)   SpO2 100%   BMI 20.19 kg/m  Thin no acute distress Alert noted x3

## 2019-12-19 NOTE — Telephone Encounter (Signed)
Patient with diagnosis of afib on Eliquis for anticoagulation.    Procedure: colonoscopy Date of procedure: 02/07/20   :[id number]}  CHA2DS2-VASc Score = 2  This indicates a 2.2% annual risk of stroke. The patient's score is based upon: CHF History: 0 HTN History: 0 Diabetes History: 0 Stroke History: 0 Vascular Disease History: 0 Age Score: 1 Gender Score: 1    CrCl 16mL/min Platelet count 256K  Per office protocol, patient can hold Eliquis for 2 days prior to procedure as requested.

## 2019-12-19 NOTE — Patient Instructions (Signed)
You have been scheduled for a colonoscopy. Please follow written instructions given to you at your visit today.  Please pick up your prep supplies at the pharmacy within the next 1-3 days. If you use inhalers (even only as needed), please bring them with you on the day of your procedure.  You will be contaced by our office prior to your procedure for directions on holding your Eliquis..  If you do not hear from our office 1 week prior to your scheduled procedure, please call [phone] to discuss.    Normal BMI (Body Mass Index- based on height and weight) is between 23 and 30. Your BMI today is Body mass index is 20.19 kg/m. [name] Please consider follow up  regarding your BMI with your Primary Care Provider.  Due to recent changes in healthcare laws, you may see the results of your imaging and laboratory studies on MyChart before your provider has had a chance to review them.  We understand that in some cases there may be results that are confusing or concerning to you. Not all laboratory results come back in the same time frame and the provider may be waiting for multiple results in order to interpret others.  Please give [place] 48 hours in order for your provider to thoroughly review all the results before contacting the office for clarification of your results.   I appreciate the opportunity to care for you. Silvano Rusk, MD, [facility]

## 2019-12-20 ENCOUNTER — Telehealth: Payer: Self-pay

## 2019-12-20 DIAGNOSIS — D2272 Melanocytic nevi of left lower limb, including hip: Secondary | ICD-10-CM | POA: Diagnosis not present

## 2019-12-20 DIAGNOSIS — L814 Other melanin hyperpigmentation: Secondary | ICD-10-CM | POA: Diagnosis not present

## 2019-12-20 DIAGNOSIS — L821 Other seborrheic keratosis: Secondary | ICD-10-CM | POA: Diagnosis not present

## 2019-12-20 DIAGNOSIS — D225 Melanocytic nevi of trunk: Secondary | ICD-10-CM | POA: Diagnosis not present

## 2019-12-20 DIAGNOSIS — L570 Actinic keratosis: Secondary | ICD-10-CM | POA: Diagnosis not present

## 2019-12-20 DIAGNOSIS — R238 Other skin changes: Secondary | ICD-10-CM | POA: Diagnosis not present

## 2019-12-20 DIAGNOSIS — D2271 Melanocytic nevi of right lower limb, including hip: Secondary | ICD-10-CM | POA: Diagnosis not present

## 2019-12-20 DIAGNOSIS — D485 Neoplasm of uncertain behavior of skin: Secondary | ICD-10-CM | POA: Diagnosis not present

## 2019-12-20 NOTE — Telephone Encounter (Signed)
I spoke to Brittany [name] and she verbalized understanding to hold her Eliquis 2 days prior to her colonoscopy on 02/07/2020.

## 2020-01-04 ENCOUNTER — Encounter: Payer: Self-pay | Admitting: Cardiology

## 2020-01-04 ENCOUNTER — Ambulatory Visit: Payer: PPO | Admitting: Cardiology

## 2020-01-04 ENCOUNTER — Other Ambulatory Visit: Payer: Self-pay

## 2020-01-04 VITALS — BP 100/66 | HR 73 | Ht 66.0 in | Wt 128.6 lb

## 2020-01-04 DIAGNOSIS — G4733 Obstructive sleep apnea (adult) (pediatric): Secondary | ICD-10-CM

## 2020-01-04 DIAGNOSIS — I480 Paroxysmal atrial fibrillation: Secondary | ICD-10-CM | POA: Diagnosis not present

## 2020-01-04 NOTE — Addendum Note (Signed)
Addended by: Loren Racer on: 01/04/2020 10:11 AM   Modules accepted: Orders

## 2020-01-04 NOTE — Patient Instructions (Signed)
Medication Instructions:  Your physician recommends that you continue on your current medications as directed. Please refer to the Current Medication list given to you today.  *If you need a refill on your cardiac medications before your next appointment, please call your pharmacy*   Lab Work: None If you have labs (blood work) drawn today and your tests are completely normal, you will receive your results only by: [name] MyChart Message (if you have MyChart) OR . A paper copy in the mail If you have any lab test that is abnormal or we need to change your treatment, we will call you to review the results.   Testing/Procedures: None   Follow-Up: At [facility], you and your health needs are our priority.  As part of our continuing mission to provide you with exceptional heart care, we have created designated Provider Care Teams.  These Care Teams include your primary Cardiologist (physician) and Advanced Practice Providers (APPs -  Physician Assistants and Nurse Practitioners) who all work together to provide you with the care you need, when you need it.  We recommend signing up for the patient portal called "MyChart".  Sign up information is provided on this After Visit Summary.  MyChart is used to connect with patients for Virtual Visits (Telemedicine).  Patients are able to view lab/test results, encounter notes, upcoming appointments, etc.  Non-urgent messages can be sent to your provider as well.   To learn more about what you can do with MyChart, go to [email or web address].    Your next appointment:   12 month(s)  The format for your next appointment:   In Person  Provider:   You may see [name], MD or one of the following Advanced Practice Providers on your designated Care Team:    Melina Copa, PA-C  Ermalinda Barrios, PA-C    Other Instructions  You have been referred to Dr. Melida Quitter at [facility].

## 2020-01-04 NOTE — Progress Notes (Signed)
Date:  01/04/2020   ID:  Brittany [name], DOB 1945/11/22, MRN [id number]   PCP:  [name], MD  Cardiologist:  Candee Furbish, MD  Sleep Medicine:  [name], MD Electrophysiologist:  Will Meredith Leeds, MD   Chief Complaint:  OSA  History of Present Illness:    Brittany [name] is a 74 y.o. female who is followed by Dr. Marlou Porch for atrial fibrillation who was referred for sleep study.  She underwent home sleep study which showed severe obstructive sleep apnea with an AHI of 34.4/h and moderate oxygen desaturations to 81%.  She underwent CPAP titration to 12 cm H2O.   She is use her PAP device but does not like it and when using it dose not sleep well at night. She uses a full face mask which she dose not like.  She feels the pressure is adequate.  She is a light sleeper and she wakes up frequently to adjust her mask and cannot get used to it.  She is interested in being evaluated for the hypoglossal nerve stimulator.    Prior CV studies:   The following studies were reviewed today:  PAP compliance download  Past Medical History:  Diagnosis Date  . A-fib ([place])   . Acid reflux   . Acute cystitis with positive culture 03/06/2014  . Anemia   . Arthritis   . Baker cyst, left 04/06/2016   Aspirated 04/06/2016  . Breast cancer ([place]) 11/23/2007   right, ER/PR -, HER 2 -  . Cancer [facility])    breast right  triple recet neg, lumpect, chemo, XRT  . Cognitive deficits 09/25/2014   2016 Very mild, could be related to meds   . Degenerative arthritis of left knee 04/06/2016   Injection and aspiration 04/06/2016  . Degenerative arthritis of right knee 09/27/2015  . Esophageal stricture   . ESOPHAGEAL STRICTURE 11/23/2007   Qualifier: Diagnosis of  By: [name], Dimas Millin Gastritis 10/2007  . History of chemotherapy    neoadjuvant chemo, last dose 03/2008  . History of radiation therapy 07/25/08 -09/11/08   right breast  . Hx of colonic polyps 08/11/2016  . Hyperlipidemia   . Irregular  heart beat 10/13/2017   2019  . Migraines    rare  . Myalgia 07/18/2013   5/15 multiple site   . Osteopenia   . Other specified injury of right quadriceps muscle, fascia and tendon, initial encounter 09/14/2016  . Rupture of quadriceps tendon 03/23/2017   Past Surgical History:  Procedure Laterality Date  . ATRIAL FIBRILLATION ABLATION N/A 10/07/2018   Procedure: ATRIAL FIBRILLATION ABLATION;  Surgeon: [name], MD;  Location: [place] CV LAB;  Service: Cardiovascular;  Laterality: N/A;  . BREAST LUMPECTOMY  06/26/2008   right, CA   XRT, chemo  . COLONOSCOPY    . EYE SURGERY     left eye cataract surgery 12/23/2015  . growth removed per right thigh     as teen - mole  . JOINT REPLACEMENT  12/31/2015   RTK  . QUADRICEPS TENDON REPAIR Right 09/14/2016   Procedure: OPEN REPAIR QUADRICEP TENDON;  Surgeon: [name], MD;  [facility];  Service: Orthopedics;  Laterality: Right;  . TOTAL KNEE ARTHROPLASTY Right 12/31/2015   Procedure: RIGHT TOTAL KNEE ARTHROPLASTY;  Surgeon: [name], MD;  Location: [facility];  Service: Orthopedics;  Laterality: Right;  . UPPER GASTROINTESTINAL ENDOSCOPY       Current Meds  Medication Sig  .  apixaban (ELIQUIS) 5 MG TABS tablet Take 1 tablet (5 mg total) by mouth 2 (two) times daily.  . Biotin 5000 MCG TABS Take 5,000 mcg by mouth daily.  . Cholecalciferol (VITAMIN D) 50 MCG (2000 UT) tablet Take 2,000 Units by mouth daily.  . citalopram (CELEXA) 40 MG tablet TAKE 1 TABLET BY MOUTH EVERY DAY  . CRANBERRY PO Take 15,000 mg by mouth daily.  [name] denosumab (PROLIA) 60 MG/ML SOLN injection Inject 60 mg into the skin once. Administer in upper arm, thigh, or abdomen  . diphenhydrAMINE (BENADRYL) 25 MG tablet Take 25 mg by mouth every 6 (six) hours as needed.  . famotidine (PEPCID) 40 MG tablet Take 1 tablet (40 mg total) by mouth daily.  . metoprolol tartrate (LOPRESSOR) 25 MG tablet Take 0.5 tablets (12.5 mg total) by mouth 2 (two) times  daily.  . Omega-3 Fatty Acids (FISH OIL) 1200 MG CAPS Take 1,200 mg by mouth daily.   . Probiotic Product (PROBIOTIC DAILY PO) Take 1 tablet by mouth daily.  . SUMAtriptan (IMITREX) 100 MG tablet TAKE 1 TABLET BY MOUTH ONCE FOR 1 DOSE. MAY REPEAT IN 2 HOURS IF HEADACHE PERSISTS / RECURS  . VALERIAN ROOT PO Take 1 capsule by mouth at bedtime.  . vitamin B-12 (CYANOCOBALAMIN) 1000 MCG tablet Take 1,000 mcg by mouth daily.  [name] zolpidem (AMBIEN) 10 MG tablet Take 0.5-1 tablets (5-10 mg total) by mouth at bedtime as needed. for sleep     Allergies:   Boniva [ibandronic acid] and Lipitor [atorvastatin]   Social History   Tobacco Use  . Smoking status: Former Smoker    Packs/day: 0.25    Years: 5.00    Pack years: 1.25    Types: Cigarettes    Quit date: 03/03/1967    Years since quitting: 52.8  . Smokeless tobacco: Never Used  . Tobacco comment: 6 cig per day when she was smoking/quit years ago  Vaping Use  . Vaping Use: Never used  Substance Use Topics  . Alcohol use: Yes    Alcohol/week: 7.0 standard drinks    Types: 7 Cans of beer per week    Comment: 1 beer per day   . Drug use: No     Family Hx: The patient's family history includes Breast cancer (age of onset: 55) in her sister; Cancer in her father; Colon cancer (age of onset: 94) in her sister; Dementia in her mother; Epilepsy in her mother; Heart disease in her father; Hip fracture in her mother; Other in her brother and maternal grandfather.  ROS:   Please see the history of present illness.     All other systems reviewed and are negative.   Labs/Other Tests and Data Reviewed:    Recent Labs: 07/24/2019: TSH 1.44 09/19/2019: ALT 12; BUN 13; Creatinine 0.79; Hemoglobin 13.3; Platelet Count 256; Potassium 4.3; Sodium 139   Recent Lipid Panel Lab Results  Component Value Date/Time   CHOL 213 (H) 07/24/2019 09:46 AM   TRIG 68.0 07/24/2019 09:46 AM   HDL 53.90 07/24/2019 09:46 AM   CHOLHDL 4 07/24/2019 09:46 AM    LDLCALC 146 (H) 07/24/2019 09:46 AM   LDLDIRECT 178.9 03/23/2013 10:59 AM    Wt Readings from Last 3 Encounters:  01/04/20 128 lb 9.6 oz (58.3 kg)  12/19/19 127 lb (57.6 kg)  11/21/19 125 lb 12.8 oz (57.1 kg)     Objective:    Vital Signs:  BP 100/66   Pulse 73   Ht 5\' 6"  (  1.676 m)   Wt 128 lb 9.6 oz (58.3 kg)   LMP 03/02/1994 (Approximate)   SpO2 99%   BMI 20.76 kg/m    GEN: Well nourished, well developed in no acute distress HEENT: Normal NECK: No JVD; No carotid bruits LYMPHATICS: No lymphadenopathy CARDIAC:RRR, no murmurs, rubs, gallops, occasional ectopy RESPIRATORY:  Clear to auscultation without rales, wheezing or rhonchi  ABDOMEN: Soft, non-tender, non-distended MUSCULOSKELETAL:  No edema; No deformity  SKIN: Warm and dry NEUROLOGIC:  Alert and oriented x 3 PSYCHIATRIC:  Normal affect    ASSESSMENT & PLAN:    1.  OSA -  The patient is tolerating PAP therapy well without any problems. The PAP download was reviewed today and showed an AHI of 1.9/hr on 12 cm H2O with 47% compliance in using more than 4 hours nightly.  The patient has been using and benefiting from PAP use and will continue to benefit from therapy.  -She is not tolerant of her PAP mask and does not sleep well with CPAP.  She has been trying it for over a year and cannot get adjusted to the mask -options discussed including trying a new mask vs referral to Dr. Redmond Baseman for possible hypoglossal nerve stimulator.  -I will refer to Dr. Redmond Baseman to discuss Inspire Device  2.  PAF -s/p ablation -she is maintaining NSR -no palpitations -continue apixaban 5mg  BID and Lopressor 12.5mg  BID  Medication Adjustments/Labs and Tests Ordered: Current medicines are reviewed at length with the patient today.  Concerns regarding medicines are outlined above.  Tests Ordered: No orders of the defined types were placed in this encounter.  Medication Changes: No orders of the defined types were placed in this  encounter.   Disposition:  Follow up in 1 year(s)  Signed, [name], MD  01/04/2020 10:00 AM    [facility]

## 2020-01-09 ENCOUNTER — Telehealth: Payer: Self-pay | Admitting: Cardiology

## 2020-01-09 DIAGNOSIS — L988 Other specified disorders of the skin and subcutaneous tissue: Secondary | ICD-10-CM | POA: Diagnosis not present

## 2020-01-09 DIAGNOSIS — D485 Neoplasm of uncertain behavior of skin: Secondary | ICD-10-CM | POA: Diagnosis not present

## 2020-01-09 NOTE — Telephone Encounter (Signed)
Patient is calling to follow up regarding scheduling an inspire procedure. She states this was discussed during appointment on 01/04/20 with Dr. Radford Pax. Please return call to discuss further.

## 2020-01-09 NOTE — Telephone Encounter (Signed)
Spoke with the patient and advised her that a referral has been placed for her to see Dr. Redmond Baseman in regards to [facility] device. She should be getting a call soon to set up an appointment.

## 2020-01-18 NOTE — Telephone Encounter (Signed)
Spoke with receptionist at Dr. Redmond Baseman office to check on the referral. The referral coordinator has the patient on her list and will be in touch soon.   I spoke with the patient and relayed this information to her. Patient verbalized understanding.

## 2020-01-18 NOTE — Telephone Encounter (Signed)
Patient states she has not heard from Dr. Redmond Baseman office.

## 2020-02-07 ENCOUNTER — Encounter: Payer: Self-pay | Admitting: Internal Medicine

## 2020-02-07 ENCOUNTER — Ambulatory Visit (AMBULATORY_SURGERY_CENTER): Payer: PPO | Admitting: Internal Medicine

## 2020-02-07 ENCOUNTER — Other Ambulatory Visit: Payer: Self-pay

## 2020-02-07 VITALS — BP 110/58 | HR 63 | Temp 97.6°F | Resp 14 | Ht 66.0 in | Wt 127.0 lb

## 2020-02-07 DIAGNOSIS — Z86010 Personal history of colonic polyps: Secondary | ICD-10-CM

## 2020-02-07 DIAGNOSIS — Z1211 Encounter for screening for malignant neoplasm of colon: Secondary | ICD-10-CM | POA: Diagnosis not present

## 2020-02-07 MED ORDER — SODIUM CHLORIDE 0.9 % IV SOLN: 500.0000 mL | Freq: Once | INTRAVENOUS | Status: DC

## 2020-02-07 NOTE — Patient Instructions (Addendum)
No polyps today.  Will consider a repeat colonoscopy (one more) in 5 years.  I appreciate the opportunity to care for you. Gatha Mayer, MD, [facility]  Resume previous medications.  Resume Eliquis today at prior dose.    YOU HAD AN ENDOSCOPIC PROCEDURE TODAY AT [facility]:   Refer to the procedure report that was given to you for any specific questions about what was found during the examination.  If the procedure report does not answer your questions, please call your gastroenterologist to clarify.  If you requested that your care partner not be given the details of your procedure findings, then the procedure report has been included in a sealed envelope for you to review at your convenience later.  YOU SHOULD EXPECT: Some feelings of bloating in the abdomen. Passage of more gas than usual.  Walking can help get rid of the air that was put into your GI tract during the procedure and reduce the bloating. If you had a lower endoscopy (such as a colonoscopy or flexible sigmoidoscopy) you may notice spotting of blood in your stool or on the toilet paper. If you underwent a bowel prep for your procedure, you may not have a normal bowel movement for a few days.  Please Note:  You might notice some irritation and congestion in your nose or some drainage.  This is from the oxygen used during your procedure.  There is no need for concern and it should clear up in a day or so.  SYMPTOMS TO REPORT IMMEDIATELY:   Following lower endoscopy (colonoscopy or flexible sigmoidoscopy):  Excessive amounts of blood in the stool  Significant tenderness or worsening of abdominal pains  Swelling of the abdomen that is new, acute  Fever of 100F or higher   For urgent or emergent issues, a gastroenterologist can be reached at any hour by calling [phone]. Do not use MyChart messaging for urgent concerns.    DIET:  We do recommend a small meal at first, but then you may proceed to your  regular diet.  Drink plenty of fluids but you should avoid alcoholic beverages for 24 hours.  ACTIVITY:  You should plan to take it easy for the rest of today and you should NOT DRIVE or use heavy machinery until tomorrow (because of the sedation medicines used during the test).    FOLLOW UP: Our staff will call the number listed on your records 48-72 hours following your procedure to check on you and address any questions or concerns that you may have regarding the information given to you following your procedure. If we do not reach you, we will leave a message.  We will attempt to reach you two times.  During this call, we will ask if you have developed any symptoms of COVID 19. If you develop any symptoms (ie: fever, flu-like symptoms, shortness of breath, cough etc.) before then, please call [phone].  If you test positive for Covid 19 in the 2 weeks post procedure, please call and report this information to [place].    If any biopsies were taken you will be contacted by phone or by letter within the next 1-3 weeks.  Please call us at [phone] if you have not heard about the biopsies in 3 weeks.    SIGNATURES/CONFIDENTIALITY: You and/or your care partner have signed paperwork which will be entered into your electronic medical record.  These signatures attest to the fact that that the information above on your After  Visit Summary has been reviewed and is understood.  Full responsibility of the confidentiality of this discharge information lies with you and/or your care-partner.

## 2020-02-07 NOTE — Progress Notes (Unsigned)
A and O x3. Report to RN. Tolerated MAC anesthesia well.

## 2020-02-07 NOTE — Op Note (Signed)
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 The patient's tolerance of                            previous anesthesia was also reviewed. The risks                            and benefits of the procedure and the sedation                            options and risks were discussed with the patient.                            All questions were answered, and informed consent                            was obtained. Prior Anticoagulants: The patient                            last took Eliquis (apixaban) 2 days prior to the                            procedure. ASA Grade Assessment: II - A patient                            with mild systemic disease. After reviewing the  risks and benefits, the patient was deemed in                            satisfactory condition to undergo the procedure.                           After obtaining informed consent, the colonoscope                            was passed under direct vision. Throughout the                            procedure, the patient's blood pressure, pulse, and                            oxygen saturations were monitored  continuously. The                            Colonoscope was introduced through the anus and                            advanced to the the cecum, identified by                            appendiceal orifice and ileocecal valve. The                            colonoscopy was somewhat difficult due to a                            tortuous colon. Successful completion of the                            procedure was aided by changing the patient's                            position and straightening and shortening the scope                            to obtain bowel loop reduction. The patient                            tolerated the procedure well. The quality of the                            bowel preparation was good. The bowel preparation                            used was Miralax via split dose instruction. The                            ileocecal valve, appendiceal orifice, and rectum  were photographed. Scope In: 10:27:49 AM Scope Out: 10:43:29 AM Scope Withdrawal Time: 0 hours 10 minutes 9 seconds  Total Procedure Duration: 0 hours 15 minutes 40 seconds  Findings:                 The perianal and digital rectal examinations were                            normal.                           Many small and large-mouthed diverticula were found                            in the sigmoid colon. There was narrowing of the                            colon in association with the diverticular opening.                           A diffuse area of mild melanosis was found in the                            sigmoid colon.                           The exam was otherwise without abnormality on                            direct and retroflexion views. Complications:            No immediate complications. Estimated Blood Loss:     Estimated blood loss: none. Impression:               - Severe diverticulosis in the sigmoid colon. There                            was  narrowing of the colon in association with the                            diverticular opening.                           - Melanosis in the colon.                           - The examination was otherwise normal on direct                            and retroflexion views.                           - No specimens collected. Recommendation:           - Patient has a contact number available for  emergencies. The signs and symptoms of potential                            delayed complications were discussed with the                            patient. Return to normal activities tomorrow.                            Written discharge instructions were provided to the                            patient.                           - Resume previous diet.                           - Continue present medications.                           - Resume Eliquis (apixaban) at prior dose today.                           - Repeat colonoscopy in 5 years. Has FHx CRCA also Gatha Mayer, MD 02/07/2020 10:56:09 AM This report has been signed electronically.

## 2020-02-09 ENCOUNTER — Telehealth: Payer: Self-pay | Admitting: *Deleted

## 2020-02-09 NOTE — Telephone Encounter (Signed)
  Follow up Call-  Call back number 02/07/2020  Post procedure Call Back phone  # [phone]  Permission to leave phone message Yes  Some recent data might be hidden     Patient questions:  Do you have a fever, pain , or abdominal swelling? No. Pain Score  0 *  Have you tolerated food without any problems? Yes.    Have you been able to return to your normal activities? Yes.    Do you have any questions about your discharge instructions: Diet   No. Medications  No. Follow up visit  No.  Do you have questions or concerns about your Care? No.  Actions: * If pain score is 4 or above: No action needed, pain <4.  1. Have you developed a fever since your procedure? no  2.   Have you had an respiratory symptoms (SOB or cough) since your procedure? no  3.   Have you tested positive for COVID 19 since your procedure no  4.   Have you had any family members/close contacts diagnosed with the COVID 19 since your procedure?  no   If yes to any of these questions please route to Joylene John, RN and Joella Prince, RN

## 2020-02-13 ENCOUNTER — Telehealth: Payer: Self-pay | Admitting: Family Medicine

## 2020-02-13 DIAGNOSIS — G4733 Obstructive sleep apnea (adult) (pediatric): Secondary | ICD-10-CM | POA: Diagnosis not present

## 2020-02-13 DIAGNOSIS — Z803 Family history of malignant neoplasm of breast: Secondary | ICD-10-CM | POA: Diagnosis not present

## 2020-02-13 DIAGNOSIS — Z1231 Encounter for screening mammogram for malignant neoplasm of breast: Secondary | ICD-10-CM | POA: Diagnosis not present

## 2020-02-13 DIAGNOSIS — Z6820 Body mass index (BMI) 20.0-20.9, adult: Secondary | ICD-10-CM | POA: Diagnosis not present

## 2020-02-13 LAB — HM MAMMOGRAPHY

## 2020-02-13 NOTE — Telephone Encounter (Signed)
Left message for patient to call back and schedule Medicare Annual Wellness Visit (AWV) either virtually or in office.   Last AWV 02/18/18  please schedule at anytime with [facility] Nurse Health Advisor 1 or 2   This should be a 45 minute visit.

## 2020-02-14 ENCOUNTER — Other Ambulatory Visit: Payer: Self-pay

## 2020-02-14 ENCOUNTER — Encounter (HOSPITAL_BASED_OUTPATIENT_CLINIC_OR_DEPARTMENT_OTHER): Payer: Self-pay | Admitting: Otolaryngology

## 2020-02-14 ENCOUNTER — Other Ambulatory Visit: Payer: Self-pay | Admitting: Otolaryngology

## 2020-02-17 ENCOUNTER — Other Ambulatory Visit (HOSPITAL_COMMUNITY)
Admission: RE | Admit: 2020-02-17 | Discharge: 2020-02-17 | Disposition: A | Discharge status: Home / Self Care | Payer: PPO | Source: Ambulatory Visit | Attending: Otolaryngology | Admitting: Otolaryngology

## 2020-02-17 DIAGNOSIS — Z20822 Contact with and (suspected) exposure to covid-19: Secondary | ICD-10-CM | POA: Insufficient documentation

## 2020-02-17 DIAGNOSIS — Z01812 Encounter for preprocedural laboratory examination: Secondary | ICD-10-CM | POA: Diagnosis not present

## 2020-02-17 LAB — SARS CORONAVIRUS 2 (TAT 6-24 HRS): SARS Coronavirus 2: NEGATIVE

## 2020-02-20 ENCOUNTER — Other Ambulatory Visit: Payer: Self-pay

## 2020-02-20 ENCOUNTER — Ambulatory Visit (INDEPENDENT_AMBULATORY_CARE_PROVIDER_SITE_OTHER): Payer: PPO

## 2020-02-20 DIAGNOSIS — Z0000 Encounter for general adult medical examination without abnormal findings: Secondary | ICD-10-CM

## 2020-02-20 NOTE — Patient Instructions (Signed)
Brittany [name] , Thank you for taking time to come for your Medicare Wellness Visit. I appreciate your ongoing commitment to your health goals. Please review the following plan we discussed and let me know if I can assist you in the future.   Screening recommendations/referrals: Colonoscopy: Done 02/07/20 Mammogram: Done 02/07/19 Bone Density: Discontinue Recommended yearly ophthalmology/optometry visit for glaucoma screening and checkup Recommended yearly dental visit for hygiene and checkup  Vaccinations: Influenza vaccine: Done 11/07/19 Pneumococcal vaccine: Up to date Tdap vaccine: Up to date Shingles vaccine: Completed 02/13/17 & 07/03/17   Covid-19:Completed 1/28, 2/25, 01/08/20  Advanced directives: Please bring a copy of your health care power of attorney and living will to the office at your convenience.  Conditions/risks identified: Get back to exercising more   Next appointment: Follow up in one year for your annual wellness visit    Preventive Care 72 Years and Older, Female Preventive care refers to lifestyle choices and visits with your health care provider that can promote health and wellness. What does preventive care include?  A yearly physical exam. This is also called an annual well check.  Dental exams once or twice a year.  Routine eye exams. Ask your health care provider how often you should have your eyes checked.  Personal lifestyle choices, including:  Daily care of your teeth and gums.  Regular physical activity.  Eating a healthy diet.  Avoiding tobacco and drug use.  Limiting alcohol use.  Practicing safe sex.  Taking low-dose aspirin every day.  Taking vitamin and mineral supplements as recommended by your health care provider. What happens during an annual well check? The services and screenings done by your health care provider during your annual well check will depend on your age, overall health, lifestyle risk factors, and family history of  disease. Counseling  Your health care provider may ask you questions about your:  Alcohol use.  Tobacco use.  Drug use.  Emotional well-being.  Home and relationship well-being.  Sexual activity.  Eating habits.  History of falls.  Memory and ability to understand (cognition).  Work and work Statistician.  Reproductive health. Screening  You may have the following tests or measurements:  Height, weight, and BMI.  Blood pressure.  Lipid and cholesterol levels. These may be checked every 5 years, or more frequently if you are over 72 years old.  Skin check.  Lung cancer screening. You may have this screening every year starting at age 72 if you have a 30-pack-year history of smoking and currently smoke or have quit within the past 15 years.  Fecal occult blood test (FOBT) of the stool. You may have this test every year starting at age 72.  Flexible sigmoidoscopy or colonoscopy. You may have a sigmoidoscopy every 5 years or a colonoscopy every 10 years starting at age 72.  Hepatitis C blood test.  Hepatitis B blood test.  Sexually transmitted disease (STD) testing.  Diabetes screening. This is done by checking your blood sugar (glucose) after you have not eaten for a while (fasting). You may have this done every 1-3 years.  Bone density scan. This is done to screen for osteoporosis. You may have this done starting at age 72.  Mammogram. This may be done every 1-2 years. Talk to your health care provider about how often you should have regular mammograms. Talk with your health care provider about your test results, treatment options, and if necessary, the need for more tests. Vaccines  Your health care provider may  recommend certain vaccines, such as:  Influenza vaccine. This is recommended every year.  Tetanus, diphtheria, and acellular pertussis (Tdap, Td) vaccine. You may need a Td booster every 10 years.  Zoster vaccine. You may need this after age  72.  Pneumococcal 13-valent conjugate (PCV13) vaccine. One dose is recommended after age 72.  Pneumococcal polysaccharide (PPSV23) vaccine. One dose is recommended after age 72. Talk to your health care provider about which screenings and vaccines you need and how often you need them. This information is not intended to replace advice given to you by your health care provider. Make sure you discuss any questions you have with your health care provider. Document Released: 03/15/2015 Document Revised: 11/06/2015 Document Reviewed: 12/18/2014 Elsevier Interactive Patient Education  2017 [place] Prevention in the Home Falls can cause injuries. They can happen to people of all ages. There are many things you can do to make your home safe and to help prevent falls. What can I do on the outside of my home?  Regularly fix the edges of walkways and driveways and fix any cracks.  Remove anything that might make you trip as you walk through a door, such as a raised step or threshold.  Trim any bushes or trees on the path to your home.  Use bright outdoor lighting.  Clear any walking paths of anything that might make someone trip, such as rocks or tools.  Regularly check to see if handrails are loose or broken. Make sure that both sides of any steps have handrails.  Any raised decks and porches should have guardrails on the edges.  Have any leaves, snow, or ice cleared regularly.  Use sand or salt on walking paths during winter.  Clean up any spills in your garage right away. This includes oil or grease spills. What can I do in the bathroom?  Use night lights.  Install grab bars by the toilet and in the tub and shower. Do not use towel bars as grab bars.  Use non-skid mats or decals in the tub or shower.  If you need to sit down in the shower, use a plastic, non-slip stool.  Keep the floor dry. Clean up any water that spills on the floor as soon as it happens.  Remove  soap buildup in the tub or shower regularly.  Attach bath mats securely with double-sided non-slip rug tape.  Do not have throw rugs and other things on the floor that can make you trip. What can I do in the bedroom?  Use night lights.  Make sure that you have a light by your bed that is easy to reach.  Do not use any sheets or blankets that are too big for your bed. They should not hang down onto the floor.  Have a firm chair that has side arms. You can use this for support while you get dressed.  Do not have throw rugs and other things on the floor that can make you trip. What can I do in the kitchen?  Clean up any spills right away.  Avoid walking on wet floors.  Keep items that you use a lot in easy-to-reach places.  If you need to reach something above you, use a strong step stool that has a grab bar.  Keep electrical cords out of the way.  Do not use floor polish or wax that makes floors slippery. If you must use wax, use non-skid floor wax.  Do not have throw rugs and  other things on the floor that can make you trip. What can I do with my stairs?  Do not leave any items on the stairs.  Make sure that there are handrails on both sides of the stairs and use them. Fix handrails that are broken or loose. Make sure that handrails are as long as the stairways.  Check any carpeting to make sure that it is firmly attached to the stairs. Fix any carpet that is loose or worn.  Avoid having throw rugs at the top or bottom of the stairs. If you do have throw rugs, attach them to the floor with carpet tape.  Make sure that you have a light switch at the top of the stairs and the bottom of the stairs. If you do not have them, ask someone to add them for you. What else can I do to help prevent falls?  Wear shoes that:  Do not have high heels.  Have rubber bottoms.  Are comfortable and fit you well.  Are closed at the toe. Do not wear sandals.  If you use a  stepladder:  Make sure that it is fully opened. Do not climb a closed stepladder.  Make sure that both sides of the stepladder are locked into place.  Ask someone to hold it for you, if possible.  Clearly mark and make sure that you can see:  Any grab bars or handrails.  First and last steps.  Where the edge of each step is.  Use tools that help you move around (mobility aids) if they are needed. These include:  Canes.  Walkers.  Scooters.  Crutches.  Turn on the lights when you go into a dark area. Replace any light bulbs as soon as they burn out.  Set up your furniture so you have a clear path. Avoid moving your furniture around.  If any of your floors are uneven, fix them.  If there are any pets around you, be aware of where they are.  Review your medicines with your doctor. Some medicines can make you feel dizzy. This can increase your chance of falling. Ask your doctor what other things that you can do to help prevent falls. This information is not intended to replace advice given to you by your health care provider. Make sure you discuss any questions you have with your health care provider. Document Released: 12/13/2008 Document Revised: 07/25/2015 Document Reviewed: 03/23/2014 Elsevier Interactive Patient Education  2017 [employer].

## 2020-02-20 NOTE — Progress Notes (Signed)
Virtual Visit via Telephone Note  I connected with  Brittany [name] on 02/20/20 at  2:30 PM EST by telephone and verified that I am speaking with the correct person using two identifiers.  Medicare Annual Wellness visit completed telephonically due to Covid-19 pandemic.   Persons participating in this call: This Health Coach and this patient.   Location: Patient: Home Provider: Office   I discussed the limitations, risks, security and privacy concerns of performing an evaluation and management service by telephone and the availability of in person appointments. The patient expressed understanding and agreed to proceed.  Unable to perform video visit due to video visit attempted and failed and/or patient does not have video capability.   Some vital signs may be absent or patient reported.   Willette Brace, LPN    Subjective:   Brittany [name] is a 74 y.o. female who presents for Medicare Annual (Subsequent) preventive examination.  Review of Systems     Cardiac Risk Factors include: advanced age (>58men, >30 women);dyslipidemia     Objective:    There were no vitals filed for this visit. There is no height or weight on file to calculate BMI.  Advanced Directives 02/20/2020 02/14/2020 02/07/2020 10/07/2018 05/04/2018 04/18/2018 02/18/2018  Does Patient Have a Medical Advance Directive? Yes Yes Yes Yes Yes Yes Yes  Type of Paramedic of [place];Living will [place];Living will Living will Living will Living will Living will [place];Living will  Does patient want to make changes to medical advance directive? - No - Patient declined No - Patient declined - No - Patient declined - -  Copy of [place] in Chart? No - copy requested No - copy requested - No - copy requested - - No - copy requested  Would patient like information on creating a medical advance directive? - - - - - - -    Current Medications  (verified) Outpatient Encounter Medications as of 02/20/2020  Medication Sig   apixaban (ELIQUIS) 5 MG TABS tablet Take 1 tablet (5 mg total) by mouth 2 (two) times daily.   Biotin 5000 MCG TABS Take 5,000 mcg by mouth daily.   cetirizine (ZYRTEC) 10 MG tablet Take 10 mg by mouth daily.   Cholecalciferol (VITAMIN D) 50 MCG (2000 UT) tablet Take 2,000 Units by mouth daily.   citalopram (CELEXA) 40 MG tablet TAKE 1 TABLET BY MOUTH EVERY DAY   CRANBERRY PO Take 15,000 mg by mouth daily.   denosumab (PROLIA) 60 MG/ML SOLN injection Inject 60 mg into the skin once. Administer in upper arm, thigh, or abdomen   diphenhydrAMINE (BENADRYL) 25 MG tablet Take 25 mg by mouth every 6 (six) hours as needed.   famotidine (PEPCID) 40 MG tablet Take 1 tablet (40 mg total) by mouth daily.   metoprolol tartrate (LOPRESSOR) 25 MG tablet Take 0.5 tablets (12.5 mg total) by mouth 2 (two) times daily.   Omega-3 Fatty Acids (FISH OIL) 1200 MG CAPS Take 1,200 mg by mouth daily.   Probiotic Product (PROBIOTIC DAILY PO) Take 1 tablet by mouth daily.   SUMAtriptan (IMITREX) 100 MG tablet TAKE 1 TABLET BY MOUTH ONCE FOR 1 DOSE. MAY REPEAT IN 2 HOURS IF HEADACHE PERSISTS / RECURS   VALERIAN ROOT PO Take 1 capsule by mouth at bedtime.   vitamin B-12 (CYANOCOBALAMIN) 1000 MCG tablet Take 1,000 mcg by mouth daily.   zolpidem (AMBIEN) 10 MG tablet Take 0.5-1 tablets (5-10 mg  total) by mouth at bedtime as needed. for sleep   Facility-Administered Encounter Medications as of 02/20/2020  Medication   0.9 %  sodium chloride infusion    Allergies (verified) Boniva [ibandronic acid] and Lipitor [atorvastatin]   History: Past Medical History:  Diagnosis Date   A-fib ([place])    Acid reflux    Acute cystitis with positive culture 03/06/2014   Anemia    Anxiety    Arthritis    Baker cyst, left 04/06/2016   Aspirated 04/06/2016   Breast cancer ([place]) 11/23/2007   right, ER/PR -, HER 2 -   Cancer ([place])     breast right  triple recet neg, lumpect, chemo, XRT   Cognitive deficits 09/25/2014   2016 Very mild, could be related to meds    Degenerative arthritis of left knee 04/06/2016   Injection and aspiration 04/06/2016   Degenerative arthritis of right knee 09/27/2015   Depression    Esophageal stricture    ESOPHAGEAL STRICTURE 11/23/2007   Qualifier: Diagnosis of  By: [name], Dimas Millin    Gastritis 10/2007   History of chemotherapy    neoadjuvant chemo, last dose 03/2008   History of radiation therapy 07/25/08 -09/11/08   right breast   Hx of colonic polyps 08/11/2016   Hyperlipidemia    Irregular heart beat 10/13/2017   2019   Migraines    rare   Myalgia 07/18/2013   5/15 multiple site    Osteopenia    Other specified injury of right quadriceps muscle, fascia and tendon, initial encounter 09/14/2016   Rupture of quadriceps tendon 03/23/2017   Sleep apnea    severe OSA AHI 34, uses CPAP   Past Surgical History:  Procedure Laterality Date   ATRIAL FIBRILLATION ABLATION N/A 10/07/2018   Procedure: ATRIAL FIBRILLATION ABLATION;  Surgeon: [name], MD;  Location: [facility];  Service: Cardiovascular;  Laterality: N/A;   BREAST LUMPECTOMY  06/26/2008   right, CA   XRT, chemo   COLONOSCOPY     EYE SURGERY     left eye cataract surgery 12/23/2015   growth removed per right thigh     as teen - mole   JOINT REPLACEMENT  12/31/2015   RTK   QUADRICEPS TENDON REPAIR Right 09/14/2016   Procedure: OPEN REPAIR QUADRICEP TENDON;  Surgeon: [name], MD;  Location: [facility];  Service: Orthopedics;  Laterality: Right;   TOTAL KNEE ARTHROPLASTY Right 12/31/2015   Procedure: RIGHT TOTAL KNEE ARTHROPLASTY;  Surgeon: [name], MD;  Location: [facility];  Service: Orthopedics;  Laterality: Right;   UPPER GASTROINTESTINAL ENDOSCOPY     Family History  Problem Relation Age of Onset   Heart disease Father    Cancer Father        lung-nonsmoker    Breast cancer Sister 2   Colon cancer Sister 35   Epilepsy Mother        at older age   Dementia Mother        ?uncertain type   Hip fracture Mother        x2   Other Brother        suicide   Other Maternal Grandfather        didn't go to doctor   Esophageal cancer Neg Hx    Stomach cancer Neg Hx    Social History   Socioeconomic History   Marital status: Married    Spouse name: Not on file   Number of children: 3  Years of education: Not on file   Highest education level: Not on file  Occupational History    Employer: RETIRED  Tobacco Use   Smoking status: Former Smoker    Packs/day: 0.25    Years: 5.00    Pack years: 1.25    Types: Cigarettes    Quit date: 03/03/1967    Years since quitting: 53.0   Smokeless tobacco: Never Used   Tobacco comment: 6 cig per day when she was smoking/quit years ago  Vaping Use   Vaping Use: Never used  Substance and Sexual Activity   Alcohol use: Yes    Alcohol/week: 7.0 standard drinks    Types: 7 Cans of beer per week    Comment: 1 beer per day    Drug use: No   Sexual activity: Yes    Partners: Male    Birth control/protection: Post-menopausal  Other Topics Concern   Not on file  Social History Narrative   G3, P3, 1st pregnancy age 53, menopause age 38, no HRT   Social Determinants of Radio broadcast assistant Strain: Low Risk    Difficulty of Paying Living Expenses: Not hard at all  Food Insecurity: No Food Insecurity   Worried About Charity fundraiser in the Last Year: Never true   Ran Out of Food in the Last Year: Never true  Transportation Needs: No Transportation Needs   Lack of Transportation (Medical): No   Lack of Transportation (Non-Medical): No  Physical Activity: Inactive   Days of Exercise per Week: 0 days   Minutes of Exercise per Session: 0 min  Stress: No Stress Concern Present   Feeling of Stress : Not at all  Social Connections: Socially Integrated   Frequency of  Communication with Friends and Family: Three times a week   Frequency of Social Gatherings with Friends and Family: Once a week   Attends Religious Services: More than 4 times per year   Active Member of [employer] or Organizations: Yes   Attends Archivist Meetings: 1 to 4 times per year   Marital Status: Married    Tobacco Counseling Counseling given: Not Answered Comment: 6 cig per day when she was smoking/quit years ago   Clinical Intake:  Pre-visit preparation completed: Yes  Pain : No/denies pain     BMI - recorded: 20.67 Nutritional Status: BMI of 19-24  Normal Nutritional Risks: None Diabetes: No  How often do you need to have someone help you when you read instructions, pamphlets, or other written materials from your doctor or pharmacy?: 1 - Never  Diabetic?No  Interpreter Needed?: No  Information entered by :: Charlott Rakes, LPN   Activities of Daily Living In your present state of health, do you have any difficulty performing the following activities: 02/20/2020  Hearing? N  Vision? N  Difficulty concentrating or making decisions? Y  Comment memory at times  Walking or climbing stairs? N  Dressing or bathing? N  Doing errands, shopping? N  Preparing Food and eating ? N  Using the Toilet? N  In the past six months, have you accidently leaked urine? N  Do you have problems with loss of bowel control? N  Managing your Medications? N  Managing your Finances? N  Housekeeping or managing your Housekeeping? N  Some recent data might be hidden    Patient Care Team: [name], MD as PCP - General (Family Medicine) [name], MD as PCP - Cardiology (Cardiology) [name],  MD as PCP - Electrophysiology (Cardiology) [name], MD as PCP - Sleep Medicine (Cardiology) [name], [name], MD as Consulting Physician (Hematology and Oncology) [name], MD as Consulting Physician (Sleep Medicine)  Indicate any  recent Medical Services you may have received from other than [facility] providers in the past year (date may be approximate).     Assessment:   This is a routine wellness examination for [place].  Hearing/Vision screen  Hearing Screening   125Hz  250Hz  500Hz  1000Hz  2000Hz  3000Hz  4000Hz  6000Hz  8000Hz   Right ear:           Left ear:           Comments: Pt denies any hearing issues  Vision Screening Comments: Pt follows up with dr Suzie Portela for annual eye exams  Dietary issues and exercise activities discussed: Current Exercise Habits: The patient does not participate in regular exercise at present  Goals     Patient Stated     Maintain current health status by continuing to exercise and eat well, enjoy family and life.     Patient Stated     Maintain current health status and [place]     Patient Stated     Do more exercise       Depression Screen PHQ 2/9 Scores 02/20/2020 02/18/2018 02/12/2017 07/28/2016  PHQ - 2 Score 0 0 0 0  PHQ- 9 Score - 0 0 -    Fall Risk Fall Risk  02/20/2020 01/25/2019 02/18/2018 02/12/2017 07/28/2016  Falls in the past year? 0 1 0 Yes No  Comment - Emmi Telephone Survey: data to providers prior to load - - -  Number falls in past yr: 0 1 - 1 -  Comment - Emmi Telephone Survey Actual Response = 1 - - -  Injury with Fall? 0 0 - No -  Risk for fall due to : Impaired vision;Impaired balance/gait;Impaired mobility - - - -  Follow up Falls prevention discussed - - - -    FALL RISK PREVENTION PERTAINING TO THE HOME:  Any stairs in or around the home? No  If so, are there any without handrails? No  Home free of loose throw rugs in walkways, pet beds, electrical cords, etc? Yes  Adequate lighting in your home to reduce risk of falls? Yes   ASSISTIVE DEVICES UTILIZED TO PREVENT FALLS:  Life alert? No Use of a cane, walker or w/c? No  Grab bars in the bathroom? Yes  Shower chair or bench in shower? Yes  Elevated toilet seat or a  handicapped toilet? Yes   TIMED UP AND GO:  Was the test performed? No .      Cognitive Function: MMSE - Mini Mental State Exam 02/18/2018 02/12/2017  Orientation to time 5 5  Orientation to Place 5 5  Registration 3 3  Attention/ Calculation 5 5  Recall 3 3  Language- name 2 objects 2 2  Language- repeat 1 1  Language- follow 3 step command 3 3  Language- read & follow direction 1 1  Write a sentence 1 1  Copy design 1 1  Total score 30 30     6CIT Screen 02/20/2020  What Year? 0 points  What month? 0 points  Count back from 20 0 points  Months in reverse 0 points  Repeat phrase 0 points    Immunizations Immunization History  Administered Date(s) Administered   Influenza Split 12/01/2011   Influenza Whole 12/06/2007, 11/15/2008, 01/06/2010, 11/11/2010  Influenza, High Dose Seasonal PF 12/20/2012, 11/20/2014, 11/21/2017, 11/07/2019   Influenza,inj,Quad PF,6+ Mos 11/30/2013   Influenza,inj,quad, With Preservative 11/02/2016   Influenza-Unspecified 11/27/2015, 10/31/2016, 10/27/2018   Moderna Sars-Covid-2 Vaccination 03/30/2019, 04/27/2019, 01/08/2020   Pneumococcal Conjugate-13 03/22/2013   Pneumococcal Polysaccharide-23 01/31/2009, 07/31/2015, 06/20/2019   Rabies, IM 03/04/2015, 03/07/2015, 03/11/2015, 03/18/2015   Td 10/09/2009   Tdap 03/04/2015   Zoster 04/18/2009   Zoster Recombinat (Shingrix) 02/13/2017, 07/03/2017    TDAP status: Up to date  Flu Vaccine status: Up to date  Pneumococcal vaccine status: Up to date  Covid-19 vaccine status: Completed vaccines  Qualifies for Shingles Vaccine? Yes   Zostavax completed Yes   Shingrix Completed?: Yes  Screening Tests Health Maintenance  Topic Date Due   COVID-19 Vaccine (4 - Booster for Moderna series) 07/07/2020   MAMMOGRAM  02/06/2021   COLONOSCOPY  02/06/2025   TETANUS/TDAP  03/03/2025   INFLUENZA VACCINE  Completed   DEXA SCAN  Completed   Hepatitis C Screening  Completed    PNA vac Low Risk Adult  Completed    Health Maintenance  There are no preventive care reminders to display for this patient.  Colorectal cancer screening: Type of screening: Colonoscopy. Completed 02/07/20. Repeat every 5 years  Mammogram status: Completed 02/07/19. Repeat every year  Bone Density status: Completed 05/05/19. Results reflect: Bone density results: NORMAL. Repeat every 0 years.   Additional Screening:  Hepatitis C Screening: Completed 05/27/15  Vision Screening: Recommended annual ophthalmology exams for early detection of glaucoma and other disorders of the eye. Is the patient up to date with their annual eye exam?  Yes  Who is the provider or what is the name of the office in which the patient attends annual eye exams? Dr Suzie Portela  Dental Screening: Recommended annual dental exams for proper oral hygiene  Community Resource Referral / Chronic Care Management: CRR required this visit?  No   CCM required this visit?  No      Plan:     I have personally reviewed and noted the following in the patients chart:    Medical and social history  Use of alcohol, tobacco or illicit drugs   Current medications and supplements  Functional ability and status  Nutritional status  Physical activity  Advanced directives  List of other physicians  Hospitalizations, surgeries, and ER visits in previous 12 months  Vitals  Screenings to include cognitive, depression, and falls  Referrals and appointments  In addition, I have reviewed and discussed with patient certain preventive protocols, quality metrics, and best practice recommendations. A written personalized care plan for preventive services as well as general preventive health recommendations were provided to patient.     Willette Brace, LPN   [id number]   Nurse Notes: None

## 2020-02-21 ENCOUNTER — Encounter (HOSPITAL_BASED_OUTPATIENT_CLINIC_OR_DEPARTMENT_OTHER): Payer: Self-pay | Admitting: Otolaryngology

## 2020-02-21 ENCOUNTER — Encounter (HOSPITAL_BASED_OUTPATIENT_CLINIC_OR_DEPARTMENT_OTHER)
Admission: RE | Disposition: A | Discharge status: Home / Self Care | Payer: Self-pay | Source: Home / Self Care | Attending: Otolaryngology

## 2020-02-21 ENCOUNTER — Ambulatory Visit (HOSPITAL_BASED_OUTPATIENT_CLINIC_OR_DEPARTMENT_OTHER): Payer: PPO | Admitting: Certified Registered"

## 2020-02-21 ENCOUNTER — Other Ambulatory Visit: Payer: Self-pay

## 2020-02-21 ENCOUNTER — Ambulatory Visit (HOSPITAL_BASED_OUTPATIENT_CLINIC_OR_DEPARTMENT_OTHER)
Admission: RE | Admit: 2020-02-21 | Discharge: 2020-02-21 | Disposition: A | Discharge status: Home / Self Care | Payer: PPO | Attending: Otolaryngology | Admitting: Otolaryngology

## 2020-02-21 DIAGNOSIS — Z7901 Long term (current) use of anticoagulants: Secondary | ICD-10-CM | POA: Diagnosis not present

## 2020-02-21 DIAGNOSIS — J449 Chronic obstructive pulmonary disease, unspecified: Secondary | ICD-10-CM | POA: Diagnosis not present

## 2020-02-21 DIAGNOSIS — Z79899 Other long term (current) drug therapy: Secondary | ICD-10-CM | POA: Insufficient documentation

## 2020-02-21 DIAGNOSIS — Z888 Allergy status to other drugs, medicaments and biological substances status: Secondary | ICD-10-CM | POA: Insufficient documentation

## 2020-02-21 DIAGNOSIS — Z7983 Long term (current) use of bisphosphonates: Secondary | ICD-10-CM | POA: Insufficient documentation

## 2020-02-21 DIAGNOSIS — Z87891 Personal history of nicotine dependence: Secondary | ICD-10-CM | POA: Insufficient documentation

## 2020-02-21 DIAGNOSIS — Z170 Estrogen receptor positive status [ER+]: Secondary | ICD-10-CM | POA: Diagnosis not present

## 2020-02-21 DIAGNOSIS — G4733 Obstructive sleep apnea (adult) (pediatric): Secondary | ICD-10-CM | POA: Insufficient documentation

## 2020-02-21 DIAGNOSIS — C50811 Malignant neoplasm of overlapping sites of right female breast: Secondary | ICD-10-CM | POA: Diagnosis not present

## 2020-02-21 HISTORY — PX: DRUG INDUCED ENDOSCOPY: SHX6808

## 2020-02-21 HISTORY — DX: Anxiety disorder, unspecified: F41.9

## 2020-02-21 HISTORY — DX: Depression, unspecified: F32.A

## 2020-02-21 SURGERY — DRUG INDUCED SLEEP ENDOSCOPY
Anesthesia: General | Site: Nose

## 2020-02-21 MED ORDER — OXYMETAZOLINE HCL 0.05 % NA SOLN
NASAL | Status: DC | PRN
  Administered 2020-02-21: 1 via TOPICAL

## 2020-02-21 MED ORDER — LIDOCAINE 2% (20 MG/ML) 5 ML SYRINGE
INTRAMUSCULAR | Status: DC | PRN
  Administered 2020-02-21: 60 mg via INTRAVENOUS

## 2020-02-21 MED ORDER — PROPOFOL 500 MG/50ML IV EMUL
INTRAVENOUS | Status: DC | PRN
  Administered 2020-02-21: 150 ug/kg/min via INTRAVENOUS

## 2020-02-21 MED ORDER — LACTATED RINGERS IV SOLN: INTRAVENOUS | Status: DC

## 2020-02-21 SURGICAL SUPPLY — 17 items
CANISTER SUCT 1200ML W/VALVE (MISCELLANEOUS) ×3 IMPLANT
COVER WAND RF STERILE (DRAPES) IMPLANT
GLOVE BIO SURGEON STRL SZ 6.5 (GLOVE) ×1 IMPLANT
GLOVE BIO SURGEON STRL SZ7.5 (GLOVE) ×3 IMPLANT
GLOVE BIO SURGEONS STRL SZ 6.5 (GLOVE) ×1
KIT CLEAN ENDO (MISCELLANEOUS) ×3 IMPLANT
NDL PRECISIONGLIDE 27X1.5 (NEEDLE) IMPLANT
NEEDLE PRECISIONGLIDE 27X1.5 (NEEDLE) IMPLANT
PACK BASIN DAY SURGERY FS (CUSTOM PROCEDURE TRAY) ×3 IMPLANT
PATTIES SURGICAL .5 X3 (DISPOSABLE) ×3 IMPLANT
SHEET MEDIUM DRAPE 40X70 STRL (DRAPES) ×2 IMPLANT
SOL ANTI FOG 6CC (MISCELLANEOUS) ×1 IMPLANT
SOLUTION ANTI FOG 6CC (MISCELLANEOUS) ×2
SYR CONTROL 10ML LL (SYRINGE) IMPLANT
TOWEL GREEN STERILE FF (TOWEL DISPOSABLE) ×3 IMPLANT
TUBE CONNECTING 20'X1/4 (TUBING)
TUBE CONNECTING 20X1/4 (TUBING) ×1 IMPLANT

## 2020-02-21 NOTE — Brief Op Note (Signed)
02/21/2020  11:06 AM  PATIENT:  Brittany [name]  74 y.o. female  PRE-OPERATIVE DIAGNOSIS:  obstructive sleep apnea  POST-OPERATIVE DIAGNOSIS:  obstructive sleep apnea  PROCEDURE:  Procedure(s): DRUG INDUCED ENDOSCOPY (N/A)  SURGEON:  Surgeon(s) and Role:    Melida Quitter, MD - Primary  PHYSICIAN ASSISTANT:   ASSISTANTS: none   ANESTHESIA:   IV sedation  EBL:  0 mL   BLOOD ADMINISTERED:none  DRAINS: none   LOCAL MEDICATIONS USED:  NONE  SPECIMEN:  No Specimen  DISPOSITION OF SPECIMEN:  N/A  COUNTS:  YES  TOURNIQUET:  * No tourniquets in log *  DICTATION: .Note written in EPIC  PLAN OF CARE: Discharge to home after PACU  PATIENT DISPOSITION:  PACU - hemodynamically stable.   Delay start of Pharmacological VTE agent (>24hrs) due to surgical blood loss or risk of bleeding: no

## 2020-02-21 NOTE — Transfer of Care (Signed)
Immediate Anesthesia Transfer of Care Note  Patient: Brittany [name]  Procedure(s) Performed: DRUG INDUCED ENDOSCOPY (N/A Nose)  Patient Location: PACU  Anesthesia Type:MAC  Level of Consciousness: awake, alert , oriented and patient cooperative  Airway & Oxygen Therapy: Patient Spontanous Breathing and Patient connected to face mask oxygen  Post-op Assessment: Report given to RN, Post -op Vital signs reviewed and stable and Patient moving all extremities  Post vital signs: Reviewed and stable  Last Vitals:  Vitals Value Taken Time  BP 134/89 02/21/20 1100  Temp 36.6 C 02/21/20 1100  Pulse 46 02/21/20 1103  Resp 15 02/21/20 1103  SpO2 93 % 02/21/20 1103  Vitals shown include unvalidated device data.  Last Pain:  Vitals:   02/21/20 0933  TempSrc: Oral  PainSc: 0-No pain         Complications: No complications documented.

## 2020-02-21 NOTE — Op Note (Signed)
Preop diagnosis: Obstructive sleep apnea Postop diagnosis: same Procedure: Drug-induced sleep endoscopy Surgeon: Redmond Baseman Anesth: IV sedation Compl: None Findings: There is complete anterior-posteior collapse at the velum making her a candidate for hypoglossal nerve stimulator placement.  There was also severe anterior-posterior collapse at the tongue base. Description:  After discussing risks, benefits, and alternatives, the patient was brought to the operative suite and placed on the operative table in the supine position.  Anesthesia was induced and the patient was given light sedation to simulate natural sleep. When the proper level was reached, an Afrin-soaked pledget was placed in the right nasal passage for a couple of minutes and then removed.  The fiberoptic laryngoscope was then passed to view the pharynx and larynx.  Findings are noted above and the exam was recorded.  After completion, the scope was removed and the patient was returned to anesthesia for wakeup and was moved to the recovery room in stable condition.

## 2020-02-21 NOTE — H&P (Signed)
Brittany [name] is an 74 y.o. female.   Chief Complaint: Sleep apnea HPI: 74 year old female with obstructive sleep apnea who has had difficulty tolerating CPAP.  She presents for sleep endoscopy.  Past Medical History:  Diagnosis Date  . A-fib ([place])   . Acid reflux   . Acute cystitis with positive culture 03/06/2014  . Anemia   . Anxiety   . Arthritis   . Baker cyst, left 04/06/2016   Aspirated 04/06/2016  . Breast cancer ([place]) 11/23/2007   right, ER/PR -, HER 2 -  . Cancer [facility])    breast right  triple recet neg, lumpect, chemo, XRT  . Cognitive deficits 09/25/2014   2016 Very mild, could be related to meds   . Degenerative arthritis of left knee 04/06/2016   Injection and aspiration 04/06/2016  . Degenerative arthritis of right knee 09/27/2015  . Depression   . Esophageal stricture   . ESOPHAGEAL STRICTURE 11/23/2007   Qualifier: Diagnosis of  By: [name], Dimas Millin Gastritis 10/2007  . History of chemotherapy    neoadjuvant chemo, last dose 03/2008  . History of radiation therapy 07/25/08 -09/11/08   right breast  . Hx of colonic polyps 08/11/2016  . Hyperlipidemia   . Irregular heart beat 10/13/2017   2019  . Migraines    rare  . Myalgia 07/18/2013   5/15 multiple site   . Osteopenia   . Other specified injury of right quadriceps muscle, fascia and tendon, initial encounter 09/14/2016  . Rupture of quadriceps tendon 03/23/2017  . Sleep apnea    severe OSA AHI 34, uses CPAP    Past Surgical History:  Procedure Laterality Date  . ATRIAL FIBRILLATION ABLATION N/A 10/07/2018   Procedure: ATRIAL FIBRILLATION ABLATION;  Surgeon: [name], MD;  Location: [place] [facility];  Service: Cardiovascular;  Laterality: N/A;  . BREAST LUMPECTOMY  06/26/2008   right, CA   XRT, chemo  . COLONOSCOPY    . EYE SURGERY     left eye cataract surgery 12/23/2015  . growth removed per right thigh     as teen - mole  . JOINT REPLACEMENT  12/31/2015   RTK  . QUADRICEPS TENDON  REPAIR Right 09/14/2016   Procedure: OPEN REPAIR QUADRICEP TENDON;  Surgeon: [name], MD;  Location: [facility];  Service: Orthopedics;  Laterality: Right;  . TOTAL KNEE ARTHROPLASTY Right 12/31/2015   Procedure: RIGHT TOTAL KNEE ARTHROPLASTY;  Surgeon: [name], MD;  Location: [facility];  Service: Orthopedics;  Laterality: Right;  . UPPER GASTROINTESTINAL ENDOSCOPY      Family History  Problem Relation Age of Onset  . Heart disease Father   . Cancer Father        lung-nonsmoker  . Breast cancer Sister 8  . Colon cancer Sister 62  . Epilepsy Mother        at older age  . Dementia Mother        ?uncertain type  . Hip fracture Mother        x2  . Other Brother        suicide  . Other Maternal Grandfather        didn't go to doctor  . Esophageal cancer Neg Hx   . Stomach cancer Neg Hx    Social History:  reports that she quit smoking about 53 years ago. Her smoking use included cigarettes. She has a 1.25 pack-year smoking history. She has never used smokeless tobacco.  She reports current alcohol use of about 7.0 standard drinks of alcohol per week. She reports that she does not use drugs.  Allergies:  Allergies  Allergen Reactions  . Boniva [Ibandronic Acid] Nausea Only  . Lipitor [Atorvastatin] Other (See Comments)    Muscle and joint pain(s)    Facility-Administered Medications Prior to Admission  Medication Dose Route Frequency Provider Last Rate Last Admin  . 0.9 %  sodium chloride infusion  500 mL Intravenous Once Gatha Mayer, MD       Medications Prior to Admission  Medication Sig Dispense Refill  . apixaban (ELIQUIS) 5 MG TABS tablet Take 1 tablet (5 mg total) by mouth 2 (two) times daily. 180 tablet 1  . Biotin 5000 MCG TABS Take 5,000 mcg by mouth daily.    . cetirizine (ZYRTEC) 10 MG tablet Take 10 mg by mouth daily.    . Cholecalciferol (VITAMIN D) 50 MCG (2000 UT) tablet Take 2,000 Units by mouth daily.    . citalopram (CELEXA) 40 MG tablet TAKE 1 TABLET  BY MOUTH EVERY DAY 90 tablet 1  . CRANBERRY PO Take 15,000 mg by mouth daily.    . diphenhydrAMINE (BENADRYL) 25 MG tablet Take 25 mg by mouth every 6 (six) hours as needed.    . famotidine (PEPCID) 40 MG tablet Take 1 tablet (40 mg total) by mouth daily. 90 tablet 3  . metoprolol tartrate (LOPRESSOR) 25 MG tablet Take 0.5 tablets (12.5 mg total) by mouth 2 (two) times daily. 45 tablet 3  . Omega-3 Fatty Acids (FISH OIL) 1200 MG CAPS Take 1,200 mg by mouth daily.    . Probiotic Product (PROBIOTIC DAILY PO) Take 1 tablet by mouth daily.    [name] VALERIAN ROOT PO Take 1 capsule by mouth at bedtime.    . vitamin B-12 (CYANOCOBALAMIN) 1000 MCG tablet Take 1,000 mcg by mouth daily.    [name] zolpidem (AMBIEN) 10 MG tablet Take 0.5-1 tablets (5-10 mg total) by mouth at bedtime as needed. for sleep 90 tablet 0  . denosumab (PROLIA) 60 MG/ML SOLN injection Inject 60 mg into the skin once. Administer in upper arm, thigh, or abdomen 1.8 mL 1  . SUMAtriptan (IMITREX) 100 MG tablet TAKE 1 TABLET BY MOUTH ONCE FOR 1 DOSE. MAY REPEAT IN 2 HOURS IF HEADACHE PERSISTS / RECURS 9 tablet 5    No results found for this or any previous visit (from the past 48 hour(s)). No results found.  Review of Systems  All other systems reviewed and are negative.   Blood pressure 114/60, pulse (!) 59, temperature 97.8 F (36.6 C), temperature source Oral, resp. rate 18, height 5\' 6"  (1.676 m), weight 57 kg, last menstrual period 03/02/1994, SpO2 100 %. Physical Exam Constitutional:      Appearance: Normal appearance. She is normal weight.  HENT:     Head: Normocephalic and atraumatic.     Right Ear: External ear normal.     Left Ear: External ear normal.     Nose: Nose normal.     Mouth/Throat:     Mouth: Mucous membranes are moist.     Pharynx: Oropharynx is clear.  Eyes:     Extraocular Movements: Extraocular movements intact.     Conjunctiva/sclera: Conjunctivae normal.     Pupils: Pupils are equal, round, and reactive  to light.  Cardiovascular:     Rate and Rhythm: Normal rate.  Pulmonary:     Effort: Pulmonary effort is normal.  Musculoskeletal:  Cervical back: Normal range of motion.  Skin:    General: Skin is warm and dry.  Neurological:     General: No focal deficit present.     Mental Status: She is alert and oriented to person, place, and time.  Psychiatric:        Mood and Affect: Mood normal.        Behavior: Behavior normal.        Thought Content: Thought content normal.        Judgment: Judgment normal.      Assessment/Plan Obstructive sleep apnea  To OR for DISE.  Melida Quitter, MD 02/21/2020, 10:12 AM

## 2020-02-21 NOTE — Anesthesia Postprocedure Evaluation (Addendum)
Anesthesia Post Note  Patient: Brittany [name]  Procedure(s) Performed: DRUG INDUCED ENDOSCOPY (N/A Nose)     Patient location during evaluation: PACU Anesthesia Type: MAC Level of consciousness: awake and alert and oriented Pain management: pain level controlled Vital Signs Assessment: post-procedure vital signs reviewed and stable Respiratory status: spontaneous breathing, nonlabored ventilation and respiratory function stable Cardiovascular status: blood pressure returned to baseline and stable Postop Assessment: no apparent nausea or vomiting Anesthetic complications: no   No complications documented.  Last Vitals:  Vitals:   02/21/20 1100 02/21/20 1115  BP: 134/89 117/67  Pulse: 60 76  Resp: 12 15  Temp: 36.6 C   SpO2: 99% 100%    Last Pain:  Vitals:   02/21/20 1115  TempSrc:   PainSc: 0-No pain                 Margeret Stachnik A.

## 2020-02-21 NOTE — Addendum Note (Signed)
Addendum  created 02/21/20 1132 by Josephine Igo, MD   Clinical Note Signed

## 2020-02-21 NOTE — Discharge Instructions (Signed)

## 2020-02-21 NOTE — Anesthesia Preprocedure Evaluation (Addendum)
Anesthesia Evaluation  Patient identified by MRN, date of birth, ID band Patient awake    Reviewed: Allergy & Precautions, NPO status , Patient's Chart, lab work & pertinent test results, reviewed documented beta blocker date and time   Airway Mallampati: I  TM Distance: >3 FB Neck ROM: Full    Dental no notable dental hx. (+) Teeth Intact, Caps   Pulmonary sleep apnea , COPD, former smoker,    Pulmonary exam normal breath sounds clear to auscultation       Cardiovascular Normal cardiovascular exam+ dysrhythmias Atrial Fibrillation  Rhythm:Regular Rate:Normal  S/P ablation   Neuro/Psych  Headaches, PSYCHIATRIC DISORDERS Anxiety Depression  Neuromuscular disease    GI/Hepatic Neg liver ROS, hiatal hernia, GERD  Medicated and Controlled,Hx/o esophageal stricture   Endo/Other  negative endocrine ROS  Renal/GU negative Renal ROS  negative genitourinary   Musculoskeletal  (+) Arthritis , Osteoarthritis,    Abdominal   Peds  Hematology  (+) anemia ,   Anesthesia Other Findings   Reproductive/Obstetrics                             Anesthesia Physical Anesthesia Plan  ASA: II  Anesthesia Plan: MAC   Post-op Pain Management:    Induction: Intravenous  PONV Risk Score and Plan: 3 and Treatment may vary due to age or medical condition and Propofol infusion  Airway Management Planned: Natural Airway and Mask  Additional Equipment:   Intra-op Plan:   Post-operative Plan:   Informed Consent: I have reviewed the patients History and Physical, chart, labs and discussed the procedure including the risks, benefits and alternatives for the proposed anesthesia with the patient or authorized representative who has indicated his/her understanding and acceptance.     Dental advisory given  Plan Discussed with: CRNA and Anesthesiologist  Anesthesia Plan Comments:        Anesthesia Quick  Evaluation

## 2020-02-22 ENCOUNTER — Encounter (HOSPITAL_BASED_OUTPATIENT_CLINIC_OR_DEPARTMENT_OTHER): Payer: Self-pay | Admitting: Otolaryngology

## 2020-03-07 ENCOUNTER — Other Ambulatory Visit: Payer: Self-pay | Admitting: Otolaryngology

## 2020-03-14 ENCOUNTER — Encounter: Payer: Self-pay | Admitting: Family Medicine

## 2020-03-25 ENCOUNTER — Other Ambulatory Visit: Payer: Self-pay | Admitting: Internal Medicine

## 2020-04-01 ENCOUNTER — Other Ambulatory Visit: Payer: Self-pay | Admitting: Podiatry

## 2020-04-01 ENCOUNTER — Other Ambulatory Visit (HOSPITAL_COMMUNITY): Payer: Self-pay | Admitting: Physician Assistant

## 2020-04-02 NOTE — Telephone Encounter (Signed)
Please advise 

## 2020-04-04 ENCOUNTER — Ambulatory Visit: Payer: PPO | Admitting: Cardiology

## 2020-04-11 ENCOUNTER — Inpatient Hospital Stay: Payer: PPO

## 2020-04-11 ENCOUNTER — Other Ambulatory Visit: Payer: Self-pay

## 2020-04-11 ENCOUNTER — Inpatient Hospital Stay: Payer: PPO | Attending: Oncology

## 2020-04-11 VITALS — BP 104/54 | HR 70 | Temp 98.1°F | Resp 16

## 2020-04-11 DIAGNOSIS — M810 Age-related osteoporosis without current pathological fracture: Secondary | ICD-10-CM

## 2020-04-11 DIAGNOSIS — K219 Gastro-esophageal reflux disease without esophagitis: Secondary | ICD-10-CM | POA: Insufficient documentation

## 2020-04-11 DIAGNOSIS — E538 Deficiency of other specified B group vitamins: Secondary | ICD-10-CM | POA: Insufficient documentation

## 2020-04-11 DIAGNOSIS — Z170 Estrogen receptor positive status [ER+]: Secondary | ICD-10-CM | POA: Insufficient documentation

## 2020-04-11 DIAGNOSIS — Z87891 Personal history of nicotine dependence: Secondary | ICD-10-CM | POA: Insufficient documentation

## 2020-04-11 DIAGNOSIS — Z803 Family history of malignant neoplasm of breast: Secondary | ICD-10-CM | POA: Diagnosis not present

## 2020-04-11 DIAGNOSIS — Z800 Family history of malignant neoplasm of digestive organs: Secondary | ICD-10-CM | POA: Diagnosis not present

## 2020-04-11 DIAGNOSIS — Z7289 Other problems related to lifestyle: Secondary | ICD-10-CM | POA: Insufficient documentation

## 2020-04-11 DIAGNOSIS — Z818 Family history of other mental and behavioral disorders: Secondary | ICD-10-CM | POA: Insufficient documentation

## 2020-04-11 DIAGNOSIS — C50911 Malignant neoplasm of unspecified site of right female breast: Secondary | ICD-10-CM | POA: Insufficient documentation

## 2020-04-11 DIAGNOSIS — Z8249 Family history of ischemic heart disease and other diseases of the circulatory system: Secondary | ICD-10-CM | POA: Diagnosis not present

## 2020-04-11 DIAGNOSIS — I480 Paroxysmal atrial fibrillation: Secondary | ICD-10-CM | POA: Diagnosis not present

## 2020-04-11 DIAGNOSIS — E785 Hyperlipidemia, unspecified: Secondary | ICD-10-CM | POA: Insufficient documentation

## 2020-04-11 DIAGNOSIS — Z801 Family history of malignant neoplasm of trachea, bronchus and lung: Secondary | ICD-10-CM | POA: Insufficient documentation

## 2020-04-11 DIAGNOSIS — Z820 Family history of epilepsy and other diseases of the nervous system: Secondary | ICD-10-CM | POA: Insufficient documentation

## 2020-04-11 LAB — CBC WITH DIFFERENTIAL (CANCER CENTER ONLY)
Abs Immature Granulocytes: 0.01 10*3/uL (ref 0.00–0.07)
Basophils Absolute: 0.1 10*3/uL (ref 0.0–0.1)
Basophils Relative: 1 %
Eosinophils Absolute: 0.2 10*3/uL (ref 0.0–0.5)
Eosinophils Relative: 3 %
HCT: 39.3 % (ref 36.0–46.0)
Hemoglobin: 12.9 g/dL (ref 12.0–15.0)
Immature Granulocytes: 0 %
Lymphocytes Relative: 24 %
Lymphs Abs: 1.4 10*3/uL (ref 0.7–4.0)
MCH: 31.7 pg (ref 26.0–34.0)
MCHC: 32.8 g/dL (ref 30.0–36.0)
MCV: 96.6 fL (ref 80.0–100.0)
Monocytes Absolute: 0.5 10*3/uL (ref 0.1–1.0)
Monocytes Relative: 8 %
Neutro Abs: 3.8 10*3/uL (ref 1.7–7.7)
Neutrophils Relative %: 64 %
Platelet Count: 224 10*3/uL (ref 150–400)
RBC: 4.07 MIL/uL (ref 3.87–5.11)
RDW: 12.7 % (ref 11.5–15.5)
WBC Count: 6 10*3/uL (ref 4.0–10.5)
nRBC: 0 % (ref 0.0–0.2)

## 2020-04-11 LAB — CMP (CANCER CENTER ONLY)
ALT: 10 U/L (ref 0–44)
AST: 16 U/L (ref 15–41)
Albumin: 4.1 g/dL (ref 3.5–5.0)
Alkaline Phosphatase: 53 U/L (ref 38–126)
Anion gap: 5 (ref 5–15)
BUN: 10 mg/dL (ref 8–23)
CO2: 29 mmol/L (ref 22–32)
Calcium: 9.2 mg/dL (ref 8.9–10.3)
Chloride: 102 mmol/L (ref 98–111)
Creatinine: 0.73 mg/dL (ref 0.44–1.00)
GFR, Estimated: >60 mL/min (ref >60)
Glucose, Bld: 82 mg/dL (ref 70–99)
Potassium: 4.3 mmol/L (ref 3.5–5.1)
Sodium: 136 mmol/L (ref 135–145)
Total Bilirubin: 0.4 mg/dL (ref 0.3–1.2)
Total Protein: 7.1 g/dL (ref 6.5–8.1)

## 2020-04-11 MED ORDER — DENOSUMAB 60 MG/ML ~~LOC~~ SOSY
PREFILLED_SYRINGE | SUBCUTANEOUS | Status: AC
  Filled 2020-04-11: qty 1

## 2020-04-11 MED ORDER — DENOSUMAB 60 MG/ML ~~LOC~~ SOSY
60.0000 mg | PREFILLED_SYRINGE | Freq: Once | SUBCUTANEOUS | Status: AC
  Administered 2020-04-11: 60 mg via SUBCUTANEOUS

## 2020-04-11 NOTE — Patient Instructions (Signed)
Denosumab injection What is this medicine? DENOSUMAB (den oh sue mab) slows bone breakdown. Prolia is used to treat osteoporosis in women after menopause and in men, and in people who are taking corticosteroids for 6 months or more. Xgeva is used to treat a high calcium level due to cancer and to prevent bone fractures and other bone problems caused by multiple myeloma or cancer bone metastases. Xgeva is also used to treat giant cell tumor of the bone. This medicine may be used for other purposes; ask your health care provider or pharmacist if you have questions. COMMON BRAND NAME(S): Prolia, XGEVA What should I tell my health care provider before I take this medicine? They need to know if you have any of these conditions:  dental disease  having surgery or tooth extraction  infection  kidney disease  low levels of calcium or Vitamin D in the blood  malnutrition  on hemodialysis  skin conditions or sensitivity  thyroid or parathyroid disease  an unusual reaction to denosumab, other medicines, foods, dyes, or preservatives  pregnant or trying to get pregnant  breast-feeding How should I use this medicine? This medicine is for injection under the skin. It is given by a health care professional in a hospital or clinic setting. A special MedGuide will be given to you before each treatment. Be sure to read this information carefully each time. For Prolia, talk to your pediatrician regarding the use of this medicine in children. Special care may be needed. For Xgeva, talk to your pediatrician regarding the use of this medicine in children. While this drug may be prescribed for children as young as 13 years for selected conditions, precautions do apply. Overdosage: If you think you have taken too much of this medicine contact a poison control center or emergency room at once. NOTE: This medicine is only for you. Do not share this medicine with others. What if I miss a dose? It is  important not to miss your dose. Call your doctor or health care professional if you are unable to keep an appointment. What may interact with this medicine? Do not take this medicine with any of the following medications:  other medicines containing denosumab This medicine may also interact with the following medications:  medicines that lower your chance of fighting infection  steroid medicines like prednisone or cortisone This list may not describe all possible interactions. Give your health care provider a list of all the medicines, herbs, non-prescription drugs, or dietary supplements you use. Also tell them if you smoke, drink alcohol, or use illegal drugs. Some items may interact with your medicine. What should I watch for while using this medicine? Visit your doctor or health care professional for regular checks on your progress. Your doctor or health care professional may order blood tests and other tests to see how you are doing. Call your doctor or health care professional for advice if you get a fever, chills or sore throat, or other symptoms of a cold or flu. Do not treat yourself. This drug may decrease your body's ability to fight infection. Try to avoid being around people who are sick. You should make sure you get enough calcium and vitamin D while you are taking this medicine, unless your doctor tells you not to. Discuss the foods you eat and the vitamins you take with your health care professional. See your dentist regularly. Brush and floss your teeth as directed. Before you have any dental work done, tell your dentist you are   receiving this medicine. Do not become pregnant while taking this medicine or for 5 months after stopping it. Talk with your doctor or health care professional about your birth control options while taking this medicine. Women should inform their doctor if they wish to become pregnant or think they might be pregnant. There is a potential for serious side  effects to an unborn child. Talk to your health care professional or pharmacist for more information. What side effects may I notice from receiving this medicine? Side effects that you should report to your doctor or health care professional as soon as possible:  allergic reactions like skin rash, itching or hives, swelling of the face, lips, or tongue  bone pain  breathing problems  dizziness  jaw pain, especially after dental work  redness, blistering, peeling of the skin  signs and symptoms of infection like fever or chills; cough; sore throat; pain or trouble passing urine  signs of low calcium like fast heartbeat, muscle cramps or muscle pain; pain, tingling, numbness in the hands or feet; seizures  unusual bleeding or bruising  unusually weak or tired Side effects that usually do not require medical attention (report to your doctor or health care professional if they continue or are bothersome):  constipation  diarrhea  headache  joint pain  loss of appetite  muscle pain  runny nose  tiredness  upset stomach This list may not describe all possible side effects. Call your doctor for medical advice about side effects. You may report side effects to FDA at 1-800-FDA-1088. Where should I keep my medicine? This medicine is only given in a clinic, doctor's office, or other health care setting and will not be stored at home. NOTE: This sheet is a summary. It may not cover all possible information. If you have questions about this medicine, talk to your doctor, pharmacist, or health care provider.  2021 Elsevier/Gold Standard (2017-06-25 16:10:44)

## 2020-04-15 ENCOUNTER — Ambulatory Visit: Payer: PPO | Admitting: Cardiology

## 2020-04-15 ENCOUNTER — Other Ambulatory Visit: Payer: Self-pay

## 2020-04-15 ENCOUNTER — Encounter: Payer: Self-pay | Admitting: Cardiology

## 2020-04-15 VITALS — BP 100/70 | HR 78 | Ht 66.0 in | Wt 126.0 lb

## 2020-04-15 DIAGNOSIS — G4733 Obstructive sleep apnea (adult) (pediatric): Secondary | ICD-10-CM | POA: Diagnosis not present

## 2020-04-15 DIAGNOSIS — I480 Paroxysmal atrial fibrillation: Secondary | ICD-10-CM

## 2020-04-15 NOTE — Patient Instructions (Signed)
Medication Instructions:  Please discontinue your Metoprolol.  Continue all other medications as listed.  *If you need a refill on your cardiac medications before your next appointment, please call your pharmacy*  Follow-Up: At [facility], you and your health needs are our priority.  As part of our continuing mission to provide you with exceptional heart care, we have created designated Provider Care Teams.  These Care Teams include your primary Cardiologist (physician) and Advanced Practice Providers (APPs -  Physician Assistants and Nurse Practitioners) who all work together to provide you with the care you need, when you need it.  We recommend signing up for the patient portal called "MyChart".  Sign up information is provided on this After Visit Summary.  MyChart is used to connect with patients for Virtual Visits (Telemedicine).  Patients are able to view lab/test results, encounter notes, upcoming appointments, etc.  Non-urgent messages can be sent to your provider as well.   To learn more about what you can do with MyChart, go to [email or web address].    Your next appointment:   6 month(s)  The format for your next appointment:   In Person  Provider:   Candee Furbish, MD   Thank you for choosing [facility]!!

## 2020-04-15 NOTE — Progress Notes (Signed)
Cardiology Office Note:    Date:  04/15/2020   ID:  Brittany [name], DOB [birth year], MRN [id number]  PCP:  [name], MD   [place]  Cardiologist:  Candee Furbish, MD  Advanced Practice Provider:  No care team member to display Electrophysiologist:  Will Meredith Leeds, MD       Referring MD: [name], MD    History of Present Illness:    Brittany [name] is a 75 y.o. female here for follow-up of paroxysmal atrial fibrillation.  Breast cancer status post lumpectomy with chemo and radiation.  Cardiac monitor showed brief A. fib.  She has had palpitations for many years.  In the past she had had a treadmill test that showed a brief episode of SVT.  04/18/2018 showed rapid atrial fibrillation with heart rates of 175 bpm in the emergency room.  She felt racing and pounding.  Nuclear stress test after episode of chest pain was reassuring low risk.  Has felt some mild lightheadedness.  Blood pressure has been 100/70 for instance.``  Overall she has been doing quite well.  LDL 146 HDL 53 hemoglobin 12.9 creatinine 0.7 potassium 4.3 ALT 10 TSH 1.4 platelets 224  Past Medical History:  Diagnosis Date  . A-fib ([place])   . Acid reflux   . Acute cystitis with positive culture 03/06/2014  . Anemia   . Anxiety   . Arthritis   . Baker cyst, left 04/06/2016   Aspirated 04/06/2016  . Breast cancer ([place]) 11/23/2007   right, ER/PR -, HER 2 -  . Cancer [facility])    breast right  triple recet neg, lumpect, chemo, XRT  . Cognitive deficits 09/25/2014   2016 Very mild, could be related to meds   . Degenerative arthritis of left knee 04/06/2016   Injection and aspiration 04/06/2016  . Degenerative arthritis of right knee 09/27/2015  . Depression   . Esophageal stricture   . ESOPHAGEAL STRICTURE 11/23/2007   Qualifier: Diagnosis of  By: [name], Dimas Millin Gastritis 10/2007  . History of chemotherapy    neoadjuvant chemo, last dose 03/2008  . History of  radiation therapy 07/25/08 -09/11/08   right breast  . Hx of colonic polyps 08/11/2016  . Hyperlipidemia   . Irregular heart beat 10/13/2017   2019  . Migraines    rare  . Myalgia 07/18/2013   5/15 multiple site   . Osteopenia   . Other specified injury of right quadriceps muscle, fascia and tendon, initial encounter 09/14/2016  . Rupture of quadriceps tendon 03/23/2017  . Sleep apnea    severe OSA AHI 34, uses CPAP    Past Surgical History:  Procedure Laterality Date  . ATRIAL FIBRILLATION ABLATION N/A 10/07/2018   Procedure: ATRIAL FIBRILLATION ABLATION;  Surgeon: [name], MD;  Location: [facility];  Service: Cardiovascular;  Laterality: N/A;  . BREAST LUMPECTOMY  06/26/2008   right, CA   XRT, chemo  . COLONOSCOPY    . DRUG INDUCED ENDOSCOPY N/A 02/21/2020   Procedure: DRUG INDUCED ENDOSCOPY;  Surgeon: Melida Quitter, MD;  Location: [facility];  Service: ENT;  Laterality: N/A;  . EYE SURGERY     left eye cataract surgery 12/23/2015  . growth removed per right thigh     as teen - mole  . JOINT REPLACEMENT  12/31/2015   RTK  . QUADRICEPS TENDON REPAIR Right 09/14/2016   Procedure: OPEN REPAIR QUADRICEP TENDON;  Surgeon: [name], MD;  Location: [facility];  Service: Orthopedics;  Laterality: Right;  . TOTAL KNEE ARTHROPLASTY Right 12/31/2015   Procedure: RIGHT TOTAL KNEE ARTHROPLASTY;  Surgeon: [name], MD;  Location: [facility];  Service: Orthopedics;  Laterality: Right;  . UPPER GASTROINTESTINAL ENDOSCOPY      Current Medications: Current Meds  Medication Sig  . apixaban (ELIQUIS) 5 MG TABS tablet Take 1 tablet (5 mg total) by mouth 2 (two) times daily.  . Biotin 5000 MCG TABS Take 5,000 mcg by mouth daily.  . cetirizine (ZYRTEC) 10 MG tablet Take 10 mg by mouth daily.  . Cholecalciferol (VITAMIN D) 50 MCG (2000 UT) tablet Take 2,000 Units by mouth daily.  . citalopram (CELEXA) 40 MG tablet TAKE 1 TABLET BY MOUTH EVERY DAY  . CRANBERRY PO  Take 15,000 mg by mouth daily.  [name] denosumab (PROLIA) 60 MG/ML SOLN injection Inject 60 mg into the skin once. Administer in upper arm, thigh, or abdomen  . diphenhydrAMINE (BENADRYL) 25 MG tablet Take 25 mg by mouth every 6 (six) hours as needed.  . famotidine (PEPCID) 40 MG tablet Take 1 tablet (40 mg total) by mouth daily.  . Omega-3 Fatty Acids (FISH OIL) 1200 MG CAPS Take 1,200 mg by mouth daily.  . Probiotic Product (PROBIOTIC DAILY PO) Take 1 tablet by mouth daily.  . SUMAtriptan (IMITREX) 100 MG tablet TAKE 1 TABLET BY MOUTH ONCE FOR 1 DOSE. MAY REPEAT IN 2 HOURS IF HEADACHE PERSISTS / RECURS  . terbinafine (LAMISIL) 250 MG tablet TAKE ONE TABLET BY MOUTH ONCE A DAY X 7DAYS, REPEAT EVERY 4 WEEKS X 4 MONTHS  . VALERIAN ROOT PO Take 1 capsule by mouth at bedtime.  . vitamin B-12 (CYANOCOBALAMIN) 1000 MCG tablet Take 1,000 mcg by mouth daily.  [name] zolpidem (AMBIEN) 10 MG tablet Take 0.5-1 tablets (5-10 mg total) by mouth at bedtime as needed. for sleep  . [DISCONTINUED] metoprolol tartrate (LOPRESSOR) 25 MG tablet Take 0.5 tablets (12.5 mg total) by mouth 2 (two) times daily.   Current Facility-Administered Medications for the 04/15/20 encounter (Office Visit) with [name], MD  Medication  . 0.9 %  sodium chloride infusion     Allergies:   Boniva [ibandronic acid] and Lipitor [atorvastatin]   Social History   Socioeconomic History  . Marital status: Married    Spouse name: Not on file  . Number of children: 3  . Years of education: Not on file  . Highest education level: Not on file  Occupational History    Employer: RETIRED  Tobacco Use  . Smoking status: Former Smoker    Packs/day: 0.25    Years: 5.00    Pack years: 1.25    Types: Cigarettes    Quit date: 03/03/1967    Years since quitting: 53.1  . Smokeless tobacco: Never Used  . Tobacco comment: 6 cig per day when she was smoking/quit years ago  Vaping Use  . Vaping Use: Never used  Substance and Sexual Activity   . Alcohol use: Yes    Alcohol/week: 7.0 standard drinks    Types: 7 Cans of beer per week    Comment: 1 beer per day   . Drug use: No  . Sexual activity: Yes    Partners: Male    Birth control/protection: Post-menopausal  Other Topics Concern  . Not on file  Social History Narrative   G3, P3, 1st pregnancy age 62, menopause age 12, no HRT   Social Determinants of  Health   Financial Resource Strain: Low Risk   . Difficulty of Paying Living Expenses: Not hard at all  Food Insecurity: No Food Insecurity  . Worried About Charity fundraiser in the Last Year: Never true  . Ran Out of Food in the Last Year: Never true  Transportation Needs: No Transportation Needs  . Lack of Transportation (Medical): No  . Lack of Transportation (Non-Medical): No  Physical Activity: Inactive  . Days of Exercise per Week: 0 days  . Minutes of Exercise per Session: 0 min  Stress: No Stress Concern Present  . Feeling of Stress : Not at all  Social Connections: Socially Integrated  . Frequency of Communication with Friends and Family: Three times a week  . Frequency of Social Gatherings with Friends and Family: Once a week  . Attends Religious Services: More than 4 times per year  . Active Member of Clubs or Organizations: Yes  . Attends Archivist Meetings: 1 to 4 times per year  . Marital Status: Married     Family History: The patient's family history includes Breast cancer (age of onset: 78) in her sister; Cancer in her father; Colon cancer (age of onset: 87) in her sister; Dementia in her mother; Epilepsy in her mother; Heart disease in her father; Hip fracture in her mother; Other in her brother and maternal grandfather. There is no history of Esophageal cancer or Stomach cancer.  ROS:   Please see the history of present illness.     All other systems reviewed and are negative.  EKGs/Labs/Other Studies Reviewed:    The following studies were reviewed today:   TTE 12/29/17   Review of the above records today demonstrates:  - Left ventricle: The cavity size was normal. Brouhard thickness was normal. Systolic function was normal. The estimated ejection fraction was in the range of 55% to 60%. Left ventricular diastolic function parameters were normal. - Aortic valve: Sclerosis without stenosis. - Mitral valve: Leaflets are severely thickened and calcified with posterior leaflet prolapse but only trivial MR. - Atrial septum: A patent foramen ovale cannot be excluded. - Left atrium: The atrium was normal in size.  30 day monitor 01/24/18 - personally reviewed  Brief episode of paroxysmal atrial fibrillation noted with maximum heart rate 163 bpm. Episode lasted approximately 2 minutes.  Occasional ventricular couplets. Occasional PVCs  Slow ventricular run of approximately 79 bpm, 7 beats. Asymptomatic.  Brief 10 beats of paroxysmal atrial tachycardia   10/03/2018: Calcium score 0 normal right dominant coronary arteries   Recent Labs: 07/24/2019: TSH 1.44 04/11/2020: ALT 10; BUN 10; Creatinine 0.73; Hemoglobin 12.9; Platelet Count 224; Potassium 4.3; Sodium 136  Recent Lipid Panel    Component Value Date/Time   CHOL 213 (H) 07/24/2019 0946   TRIG 68.0 07/24/2019 0946   HDL 53.90 07/24/2019 0946   CHOLHDL 4 07/24/2019 0946   VLDL 13.6 07/24/2019 0946   LDLCALC 146 (H) 07/24/2019 0946   LDLDIRECT 178.9 03/23/2013 1059     Risk Assessment/Calculations:      Physical Exam:    VS:  BP 100/70 (BP Location: Left Arm, Patient Position: Sitting, Cuff Size: Normal)   Pulse 78   Ht 5\' 6"  (1.676 m)   Wt 126 lb (57.2 kg)   LMP 03/02/1994 (Approximate)   SpO2 97%   BMI 20.34 kg/m     Wt Readings from Last 3 Encounters:  04/15/20 126 lb (57.2 kg)  02/21/20 125 lb 10.6 oz (57 kg)  02/07/20  127 lb (57.6 kg)     GEN:  Well nourished, well developed in no acute distress HEENT: Normal NECK: No JVD; No carotid bruits LYMPHATICS: No  lymphadenopathy CARDIAC: RRR, occasional ectopic beat, no murmurs, rubs, gallops RESPIRATORY:  Clear to auscultation without rales, wheezing or rhonchi  ABDOMEN: Soft, non-tender, non-distended MUSCULOSKELETAL:  No edema; No deformity  SKIN: Warm and dry NEUROLOGIC:  Alert and oriented x 3 PSYCHIATRIC:  Normal affect   ASSESSMENT:    1. PAF (paroxysmal atrial fibrillation) ([place])   2. OSA (obstructive sleep apnea)    PLAN:    In order of problems listed above:  Paroxysmal atrial fibrillation -On Eliquis, had atrial fibrillation ablation 10/07/2018, Dr. Curt Bears.  CHA2DS2-VASc 2.  Remaining in sinus rhythm.  Has occasional ectopic beat noted on auscultation. -Since she is with softer blood pressure 100/70, and occasionally may feel some dizziness, I will stop her metoprolol 12.5 twice a day.  Obstructive sleep apnea -Has been compliant.  Dr. Radford Pax has been monitoring.       Medication Adjustments/Labs and Tests Ordered: Current medicines are reviewed at length with the patient today.  Concerns regarding medicines are outlined above.  No orders of the defined types were placed in this encounter.  No orders of the defined types were placed in this encounter.   Patient Instructions  Medication Instructions:  Please discontinue your Metoprolol.  Continue all other medications as listed.  *If you need a refill on your cardiac medications before your next appointment, please call your pharmacy*  Follow-Up: At [facility], you and your health needs are our priority.  As part of our continuing mission to provide you with exceptional heart care, we have created designated Provider Care Teams.  These Care Teams include your primary Cardiologist (physician) and Advanced Practice Providers (APPs -  Physician Assistants and Nurse Practitioners) who all work together to provide you with the care you need, when you need it.  We recommend signing up for the patient portal called  "MyChart".  Sign up information is provided on this After Visit Summary.  MyChart is used to connect with patients for Virtual Visits (Telemedicine).  Patients are able to view lab/test results, encounter notes, upcoming appointments, etc.  Non-urgent messages can be sent to your provider as well.   To learn more about what you can do with MyChart, go to [email or web address].    Your next appointment:   6 month(s)  The format for your next appointment:   In Person  Provider:   Candee Furbish, MD   Thank you for choosing [facility]!!         Signed, Candee Furbish, MD  04/15/2020 10:42 AM    [place]

## 2020-05-02 ENCOUNTER — Ambulatory Visit: Payer: PPO | Admitting: Cardiology

## 2020-05-04 ENCOUNTER — Other Ambulatory Visit: Payer: Self-pay | Admitting: Family Medicine

## 2020-05-08 ENCOUNTER — Telehealth (INDEPENDENT_AMBULATORY_CARE_PROVIDER_SITE_OTHER): Payer: PPO | Admitting: Family Medicine

## 2020-05-08 ENCOUNTER — Ambulatory Visit (INDEPENDENT_AMBULATORY_CARE_PROVIDER_SITE_OTHER): Payer: PPO

## 2020-05-08 DIAGNOSIS — R059 Cough, unspecified: Secondary | ICD-10-CM

## 2020-05-08 DIAGNOSIS — F411 Generalized anxiety disorder: Secondary | ICD-10-CM | POA: Diagnosis not present

## 2020-05-08 DIAGNOSIS — J439 Emphysema, unspecified: Secondary | ICD-10-CM | POA: Diagnosis not present

## 2020-05-08 DIAGNOSIS — J302 Other seasonal allergic rhinitis: Secondary | ICD-10-CM

## 2020-05-08 DIAGNOSIS — F3342 Major depressive disorder, recurrent, in full remission: Secondary | ICD-10-CM | POA: Diagnosis not present

## 2020-05-08 MED ORDER — BUPROPION HCL ER (XL) 150 MG PO TB24: 150.0000 mg | ORAL_TABLET | Freq: Every day | ORAL | 1 refills | Status: DC

## 2020-05-08 MED ORDER — FLUTICASONE PROPIONATE 50 MCG/ACT NA SUSP: 2.0000 | Freq: Every day | NASAL | 6 refills | Status: DC

## 2020-05-08 MED ORDER — MONTELUKAST SODIUM 10 MG PO TABS: 10.0000 mg | ORAL_TABLET | Freq: Every day | ORAL | 1 refills | Status: DC

## 2020-05-08 NOTE — Progress Notes (Signed)
Virtual Visit via Video Note  I connected with Brittany [name]  on 05/08/20 at 10:30 AM EST by a video enabled telemedicine application and verified that I am speaking with the correct person using two identifiers.  Location patient: home Location provider: [place], [place] [address] Persons participating in the virtual visit: patient, provider  I discussed the limitations of evaluation and management by telemedicine and the availability of in person appointments. The patient expressed understanding and agreed to proceed.   Brittany [name] DOB: 12-21-1945 Encounter date: 05/08/2020  This is a 75 y.o. female who presents with No chief complaint on file.   History of present illness: Has had a cough for a few weeks - generally in morning and night (more than during day). Very dry - coughs once and then has a spell of coughing - and can't stop. Like ten times in a row. Not deep in her chest. Usually ok during day. Like tickle in throat. No fevers. No shortness of breath or trouble with breathing. No edema. She takes the famotidine regularly; no break through symptoms. Not positional. Not through night. No racing heart; a fib seems to be under control.   Has post nasal drainage all the time. Does take allertec. If sneezing/ more severe sx will take benadryl. This happens once or twice/week.  Sleeping ok. Just anxious. On verge of tears all the time. Mood just not where it should be. Stress with daughter (alcohol), grandson (enlisted), worries about friend with advanced Parkinsons. Not suicidal, just not doing well.  Allergies  Allergen Reactions  . Boniva [Ibandronic Acid] Nausea Only  . Lipitor [Atorvastatin] Other (See Comments)    Muscle and joint pain(s)   No outpatient medications have been marked as taking for the 05/08/20 encounter (Appointment) with [name], MD.   Current Facility-Administered Medications for the 05/08/20 encounter  (Appointment) with [name], MD  Medication  . 0.9 %  sodium chloride infusion    Review of Systems  Constitutional: Negative for chills, fatigue and fever.  Respiratory: Positive for cough. Negative for chest tightness, shortness of breath and wheezing.   Cardiovascular: Negative for chest pain, palpitations and leg swelling.    Objective:  LMP 03/02/1994 (Approximate)       BP Readings from Last 3 Encounters:  04/15/20 100/70  04/11/20 (!) 104/54  02/21/20 123/73   Wt Readings from Last 3 Encounters:  04/15/20 126 lb (57.2 kg)  02/21/20 125 lb 10.6 oz (57 kg)  02/07/20 127 lb (57.6 kg)    EXAM:  GENERAL: alert, oriented, appears well and in no acute distress  HEENT: atraumatic, conjunctiva clear, no obvious abnormalities on inspection of external nose and ears  NECK: normal movements of the head and neck  LUNGS: on inspection no signs of respiratory distress, breathing rate appears normal, no obvious gross SOB, gasping or wheezing. Occasional dry cough.  CV: no obvious cyanosis  MS: moves all visible extremities without noticeable abnormality  PSYCH/NEURO: pleasant and cooperative, no obvious depression or anxiety, speech and thought processing grossly intact   Assessment/Plan  1. Cough Do not suspect infection, but with upcoming procedure I will get xray to ensure no underlying concerns. - DG Chest 2 View; Future  2. Seasonal allergies We are going to start flonase and singulair to help with allergy sx; continue allertec. Hoping this stops the dry cough/post nasal drainage. - montelukast (SINGULAIR) 10 MG tablet; Take 1 tablet (10 mg  total) by mouth at bedtime.  Dispense: 90 tablet; Refill: 1  3. Recurrent major depressive disorder, in full remission ([place]) Adding welbutrin to citalopram to help with mood. Multiple stressors currently. We discussed alternative options including effexor if this is not effective. - buPROPion (WELLBUTRIN XL) 150 MG 24  hr tablet; Take 1 tablet (150 mg total) by mouth daily.  Dispense: 90 tablet; Refill: 1  4. Anxiety state Continue citalopram; we are adding wellbutrin to help with depressed symptoms. We discuss hydroyxzine if needed. Prefers to avoid controlled substances. If no improvement with combo citalopram/wellbutrin we will consider effexor.    Return if symptoms worsen or fail to improve.   I discussed the assessment and treatment plan with the patient. The patient was provided an opportunity to ask questions and all were answered. The patient agreed with the plan and demonstrated an understanding of the instructions.   The patient was advised to call back or seek an in-person evaluation if the symptoms worsen or if the condition fails to improve as anticipated.  I provided 35 minutes of non-face-to-face time during this encounter.   [name], MD

## 2020-05-11 ENCOUNTER — Other Ambulatory Visit (HOSPITAL_COMMUNITY)
Admission: RE | Admit: 2020-05-11 | Discharge: 2020-05-11 | Disposition: A | Discharge status: Home / Self Care | Payer: PPO | Source: Ambulatory Visit | Attending: Otolaryngology | Admitting: Otolaryngology

## 2020-05-11 DIAGNOSIS — Z01812 Encounter for preprocedural laboratory examination: Secondary | ICD-10-CM | POA: Diagnosis not present

## 2020-05-11 DIAGNOSIS — Z20822 Contact with and (suspected) exposure to covid-19: Secondary | ICD-10-CM | POA: Diagnosis not present

## 2020-05-11 LAB — SARS CORONAVIRUS 2 (TAT 6-24 HRS): SARS Coronavirus 2: NEGATIVE

## 2020-05-13 ENCOUNTER — Other Ambulatory Visit: Payer: Self-pay | Admitting: Family Medicine

## 2020-05-13 ENCOUNTER — Telehealth: Payer: Self-pay | Admitting: Family Medicine

## 2020-05-13 DIAGNOSIS — R9389 Abnormal findings on diagnostic imaging of other specified body structures: Secondary | ICD-10-CM

## 2020-05-13 DIAGNOSIS — R918 Other nonspecific abnormal finding of lung field: Secondary | ICD-10-CM

## 2020-05-13 DIAGNOSIS — H26491 Other secondary cataract, right eye: Secondary | ICD-10-CM | POA: Diagnosis not present

## 2020-05-13 DIAGNOSIS — H43811 Vitreous degeneration, right eye: Secondary | ICD-10-CM | POA: Diagnosis not present

## 2020-05-13 DIAGNOSIS — Z961 Presence of intraocular lens: Secondary | ICD-10-CM | POA: Diagnosis not present

## 2020-05-13 NOTE — Telephone Encounter (Signed)
Patient is calling and have questions about her XRAY results and would like a call back, please advise. CB is [phone]

## 2020-05-14 ENCOUNTER — Other Ambulatory Visit: Payer: Self-pay

## 2020-05-14 ENCOUNTER — Encounter (HOSPITAL_COMMUNITY): Payer: Self-pay | Admitting: Otolaryngology

## 2020-05-14 NOTE — Anesthesia Preprocedure Evaluation (Addendum)
Anesthesia Evaluation  Patient identified by MRN, date of birth, ID band Patient awake    Reviewed: Allergy & Precautions, NPO status , Patient's Chart, lab work & pertinent test results  History of Anesthesia Complications Negative for: history of anesthetic complications  Airway Mallampati: II  TM Distance: >3 FB Neck ROM: Full    Dental no notable dental hx. (+) Dental Advisory Given   Pulmonary sleep apnea and Continuous Positive Airway Pressure Ventilation , former smoker,    breath sounds clear to auscultation       Cardiovascular + dysrhythmias Atrial Fibrillation  Rhythm:Regular Rate:Normal  Narrative  The left ventricular ejection fraction is normal (55-65%).  Nuclear stress EF: 56%. There are no Hatchel motion abnormalities.  There was no ST segment deviation noted during stress.  Defect 1: There is a small defect of mild severity present in the apex  location. This likely represents apical thinning. No infarct, no ischemia  noted.  This is a low risk study.   Neuro/Psych  Headaches, PSYCHIATRIC DISORDERS Anxiety Depression    GI/Hepatic Neg liver ROS, hiatal hernia, GERD  Medicated,  Endo/Other  negative endocrine ROS  Renal/GU negative Renal ROS     Musculoskeletal  (+) Arthritis ,   Abdominal   Peds  Hematology negative hematology ROS (+)   Anesthesia Other Findings H/o breast cancer  Reproductive/Obstetrics                            Anesthesia Physical Anesthesia Plan  ASA: III  Anesthesia Plan: General   Post-op Pain Management:    Induction: Intravenous  PONV Risk Score and Plan: 3 and Dexamethasone and Ondansetron  Airway Management Planned: Oral ETT  Additional Equipment:   Intra-op Plan:   Post-operative Plan: Extubation in OR  Informed Consent: I have reviewed the patients History and Physical, chart, labs and discussed the procedure including the  risks, benefits and alternatives for the proposed anesthesia with the patient or authorized representative who has indicated his/her understanding and acceptance.     Dental advisory given  Plan Discussed with: CRNA and Anesthesiologist  Anesthesia Plan Comments:        Anesthesia Quick Evaluation

## 2020-05-14 NOTE — Progress Notes (Signed)
Mrs Shuey denies chest pain, shortness of breath.  Patient states that  she has not had Palpations in a long time. Mrs. Cudmore has been in quarantine since Covid test was done.  Mrs Lahmann is on Eliquis, patient was not given instructions on holding medication, she did not take Eliquis last evening.  I told Mers Ranney that I would call Dr. Redmond Baseman scheduler and ask her to call you with information on Eliquis,

## 2020-05-14 NOTE — Telephone Encounter (Signed)
See results note. 

## 2020-05-15 ENCOUNTER — Ambulatory Visit (HOSPITAL_COMMUNITY)
Admission: RE | Admit: 2020-05-15 | Discharge: 2020-05-15 | Disposition: A | Discharge status: Home / Self Care | Payer: PPO | Attending: Otolaryngology | Admitting: Otolaryngology

## 2020-05-15 ENCOUNTER — Encounter (HOSPITAL_COMMUNITY)
Admission: RE | Disposition: A | Discharge status: Home / Self Care | Payer: Self-pay | Source: Home / Self Care | Attending: Otolaryngology

## 2020-05-15 ENCOUNTER — Encounter (HOSPITAL_COMMUNITY): Payer: Self-pay | Admitting: Otolaryngology

## 2020-05-15 ENCOUNTER — Ambulatory Visit (HOSPITAL_COMMUNITY): Payer: PPO | Admitting: Anesthesiology

## 2020-05-15 ENCOUNTER — Ambulatory Visit (HOSPITAL_COMMUNITY): Payer: PPO

## 2020-05-15 DIAGNOSIS — R221 Localized swelling, mass and lump, neck: Secondary | ICD-10-CM | POA: Diagnosis not present

## 2020-05-15 DIAGNOSIS — Z7901 Long term (current) use of anticoagulants: Secondary | ICD-10-CM | POA: Diagnosis not present

## 2020-05-15 DIAGNOSIS — G4730 Sleep apnea, unspecified: Secondary | ICD-10-CM | POA: Diagnosis not present

## 2020-05-15 DIAGNOSIS — Z87891 Personal history of nicotine dependence: Secondary | ICD-10-CM | POA: Insufficient documentation

## 2020-05-15 DIAGNOSIS — Z6820 Body mass index (BMI) 20.0-20.9, adult: Secondary | ICD-10-CM | POA: Diagnosis not present

## 2020-05-15 DIAGNOSIS — G4733 Obstructive sleep apnea (adult) (pediatric): Secondary | ICD-10-CM | POA: Diagnosis not present

## 2020-05-15 DIAGNOSIS — E785 Hyperlipidemia, unspecified: Secondary | ICD-10-CM | POA: Diagnosis not present

## 2020-05-15 DIAGNOSIS — Z79899 Other long term (current) drug therapy: Secondary | ICD-10-CM | POA: Insufficient documentation

## 2020-05-15 DIAGNOSIS — C50811 Malignant neoplasm of overlapping sites of right female breast: Secondary | ICD-10-CM | POA: Diagnosis not present

## 2020-05-15 DIAGNOSIS — Z170 Estrogen receptor positive status [ER+]: Secondary | ICD-10-CM | POA: Diagnosis not present

## 2020-05-15 HISTORY — DX: Cardiac arrhythmia, unspecified: I49.9

## 2020-05-15 HISTORY — PX: IMPLANTATION OF HYPOGLOSSAL NERVE STIMULATOR: SHX6827

## 2020-05-15 LAB — CBC
HCT: 39.7 % (ref 36.0–46.0)
Hemoglobin: 12.7 g/dL (ref 12.0–15.0)
MCH: 31.4 pg (ref 26.0–34.0)
MCHC: 32 g/dL (ref 30.0–36.0)
MCV: 98 fL (ref 80.0–100.0)
Platelets: 253 10*3/uL (ref 150–400)
RBC: 4.05 MIL/uL (ref 3.87–5.11)
RDW: 12.7 % (ref 11.5–15.5)
WBC: 4.5 10*3/uL (ref 4.0–10.5)
nRBC: 0 % (ref 0.0–0.2)

## 2020-05-15 LAB — BASIC METABOLIC PANEL
Anion gap: 7 (ref 5–15)
BUN: 10 mg/dL (ref 8–23)
CO2: 26 mmol/L (ref 22–32)
Calcium: 8.7 mg/dL — ABNORMAL LOW (ref 8.9–10.3)
Chloride: 104 mmol/L (ref 98–111)
Creatinine, Ser: 0.58 mg/dL (ref 0.44–1.00)
GFR, Estimated: >60 mL/min (ref >60)
Glucose, Bld: 85 mg/dL (ref 70–99)
Potassium: 3.8 mmol/L (ref 3.5–5.1)
Sodium: 137 mmol/L (ref 135–145)

## 2020-05-15 SURGERY — INSERTION, HYPOGLOSSAL NERVE STIMULATOR
Anesthesia: General | Site: Chest

## 2020-05-15 MED ORDER — LIDOCAINE-EPINEPHRINE 1 %-1:100000 IJ SOLN
INTRAMUSCULAR | Status: AC
  Filled 2020-05-15: qty 1

## 2020-05-15 MED ORDER — SODIUM CHLORIDE 0.9 % IV SOLN
0.0125 ug/kg/min | INTRAVENOUS | Status: AC
  Administered 2020-05-15: .1 ug/kg/min via INTRAVENOUS
  Filled 2020-05-15: qty 1000

## 2020-05-15 MED ORDER — FENTANYL CITRATE (PF) 250 MCG/5ML IJ SOLN
INTRAMUSCULAR | Status: AC
  Filled 2020-05-15: qty 5

## 2020-05-15 MED ORDER — CHLORHEXIDINE GLUCONATE 0.12 % MT SOLN
OROMUCOSAL | Status: AC
  Administered 2020-05-15: 15 mL via OROMUCOSAL
  Filled 2020-05-15: qty 15

## 2020-05-15 MED ORDER — 0.9 % SODIUM CHLORIDE (POUR BTL) OPTIME
TOPICAL | Status: DC | PRN
  Administered 2020-05-15: 1000 mL

## 2020-05-15 MED ORDER — STERILE WATER FOR IRRIGATION IR SOLN
Status: DC | PRN
  Administered 2020-05-15: 1000 mL

## 2020-05-15 MED ORDER — LIDOCAINE 2% (20 MG/ML) 5 ML SYRINGE
INTRAMUSCULAR | Status: DC | PRN
  Administered 2020-05-15: 100 mg via INTRAVENOUS

## 2020-05-15 MED ORDER — SUCCINYLCHOLINE CHLORIDE 200 MG/10ML IV SOSY
PREFILLED_SYRINGE | INTRAVENOUS | Status: AC
  Filled 2020-05-15: qty 10

## 2020-05-15 MED ORDER — LACTATED RINGERS IV SOLN: INTRAVENOUS | Status: DC

## 2020-05-15 MED ORDER — FENTANYL CITRATE (PF) 100 MCG/2ML IJ SOLN
INTRAMUSCULAR | Status: AC
  Administered 2020-05-15: 25 ug via INTRAVENOUS
  Filled 2020-05-15: qty 2

## 2020-05-15 MED ORDER — AMISULPRIDE (ANTIEMETIC) 5 MG/2ML IV SOLN: 10.0000 mg | Freq: Once | INTRAVENOUS | Status: DC | PRN

## 2020-05-15 MED ORDER — DEXAMETHASONE SODIUM PHOSPHATE 10 MG/ML IJ SOLN
INTRAMUSCULAR | Status: DC | PRN
  Administered 2020-05-15: 10 mg via INTRAVENOUS

## 2020-05-15 MED ORDER — FENTANYL CITRATE (PF) 100 MCG/2ML IJ SOLN
25.0000 ug | INTRAMUSCULAR | Status: DC | PRN
Start: 2020-05-15 — End: 2020-05-15
  Administered 2020-05-15: 25 ug via INTRAVENOUS

## 2020-05-15 MED ORDER — SUCCINYLCHOLINE CHLORIDE 20 MG/ML IJ SOLN
INTRAMUSCULAR | Status: DC | PRN
  Administered 2020-05-15: 140 mg via INTRAVENOUS

## 2020-05-15 MED ORDER — LIDOCAINE-EPINEPHRINE 1 %-1:100000 IJ SOLN
INTRAMUSCULAR | Status: DC | PRN
  Administered 2020-05-15: 7 mL

## 2020-05-15 MED ORDER — PROPOFOL 10 MG/ML IV BOLUS
INTRAVENOUS | Status: DC | PRN
  Administered 2020-05-15: 120 mg via INTRAVENOUS

## 2020-05-15 MED ORDER — PHENYLEPHRINE HCL-NACL 10-0.9 MG/250ML-% IV SOLN
INTRAVENOUS | Status: DC | PRN
  Administered 2020-05-15: 40 ug/min via INTRAVENOUS
  Administered 2020-05-15: 30 ug/min via INTRAVENOUS

## 2020-05-15 MED ORDER — ACETAMINOPHEN 500 MG PO TABS
1000.0000 mg | ORAL_TABLET | Freq: Once | ORAL | Status: AC
  Administered 2020-05-15: 1000 mg via ORAL
  Filled 2020-05-15: qty 2

## 2020-05-15 MED ORDER — MIDAZOLAM HCL 5 MG/5ML IJ SOLN
INTRAMUSCULAR | Status: DC | PRN
  Administered 2020-05-15: 2 mg via INTRAVENOUS

## 2020-05-15 MED ORDER — LIDOCAINE 2% (20 MG/ML) 5 ML SYRINGE
INTRAMUSCULAR | Status: AC
  Filled 2020-05-15: qty 5

## 2020-05-15 MED ORDER — EPHEDRINE SULFATE-NACL 50-0.9 MG/10ML-% IV SOSY
PREFILLED_SYRINGE | INTRAVENOUS | Status: DC | PRN
  Administered 2020-05-15: 10 mg via INTRAVENOUS

## 2020-05-15 MED ORDER — PHENYLEPHRINE 40 MCG/ML (10ML) SYRINGE FOR IV PUSH (FOR BLOOD PRESSURE SUPPORT)
PREFILLED_SYRINGE | INTRAVENOUS | Status: DC | PRN
  Administered 2020-05-15: 80 ug via INTRAVENOUS
  Administered 2020-05-15: 160 ug via INTRAVENOUS
  Administered 2020-05-15: 80 ug via INTRAVENOUS

## 2020-05-15 MED ORDER — MIDAZOLAM HCL 2 MG/2ML IJ SOLN
INTRAMUSCULAR | Status: AC
  Filled 2020-05-15: qty 2

## 2020-05-15 MED ORDER — PROPOFOL 10 MG/ML IV BOLUS
INTRAVENOUS | Status: AC
  Filled 2020-05-15: qty 20

## 2020-05-15 MED ORDER — FENTANYL CITRATE (PF) 100 MCG/2ML IJ SOLN
INTRAMUSCULAR | Status: DC | PRN
  Administered 2020-05-15: 100 ug via INTRAVENOUS

## 2020-05-15 MED ORDER — CEFAZOLIN SODIUM-DEXTROSE 2-4 GM/100ML-% IV SOLN
2.0000 g | INTRAVENOUS | Status: AC
  Administered 2020-05-15: 2 g via INTRAVENOUS
  Filled 2020-05-15: qty 100

## 2020-05-15 MED ORDER — ONDANSETRON HCL 4 MG/2ML IJ SOLN
INTRAMUSCULAR | Status: DC | PRN
  Administered 2020-05-15: 4 mg via INTRAVENOUS

## 2020-05-15 MED ORDER — ROCURONIUM BROMIDE 10 MG/ML (PF) SYRINGE
PREFILLED_SYRINGE | INTRAVENOUS | Status: AC
  Filled 2020-05-15: qty 10

## 2020-05-15 MED ORDER — CHLORHEXIDINE GLUCONATE 0.12 % MT SOLN: 15.0000 mL | Freq: Once | OROMUCOSAL | Status: AC

## 2020-05-15 MED ORDER — GLYCOPYRROLATE PF 0.2 MG/ML IJ SOSY
PREFILLED_SYRINGE | INTRAMUSCULAR | Status: DC | PRN
  Administered 2020-05-15: .2 mg via INTRAVENOUS

## 2020-05-15 MED ORDER — OXYCODONE HCL 5 MG PO TABS
ORAL_TABLET | ORAL | Status: AC
  Administered 2020-05-15: 5 mg via ORAL
  Filled 2020-05-15: qty 1

## 2020-05-15 MED ORDER — HYDROCODONE-ACETAMINOPHEN 5-325 MG PO TABS: 1.0000 | ORAL_TABLET | Freq: Four times a day (QID) | ORAL | 0 refills | Status: DC | PRN

## 2020-05-15 MED ORDER — OXYCODONE HCL 5 MG PO TABS: 5.0000 mg | ORAL_TABLET | Freq: Once | ORAL | Status: AC

## 2020-05-15 MED ORDER — ORAL CARE MOUTH RINSE: 15.0000 mL | Freq: Once | OROMUCOSAL | Status: AC

## 2020-05-15 SURGICAL SUPPLY — 70 items
ADH SKN CLS APL DERMABOND .7 (GAUZE/BANDAGES/DRESSINGS) ×1
BAG DECANTER FOR FLEXI CONT (MISCELLANEOUS) ×1 IMPLANT
BLADE CLIPPER SURG (BLADE) IMPLANT
BLADE SURG 15 STRL LF DISP TIS (BLADE) ×3 IMPLANT
BLADE SURG 15 STRL SS (BLADE) ×2
CANISTER SUCT 3000ML PPV (MISCELLANEOUS) ×2 IMPLANT
CORD BIPOLAR FORCEPS 12FT (ELECTRODE) ×2 IMPLANT
COVER PROBE W GEL 5X96 (DRAPES) ×2 IMPLANT
COVER SURGICAL LIGHT HANDLE (MISCELLANEOUS) ×2 IMPLANT
COVER WAND RF STERILE (DRAPES) ×1 IMPLANT
DERMABOND ADVANCED (GAUZE/BANDAGES/DRESSINGS) ×1
DERMABOND ADVANCED .7 DNX12 (GAUZE/BANDAGES/DRESSINGS) ×2 IMPLANT
DRAPE C-ARM 35X43 STRL (DRAPES) ×2 IMPLANT
DRAPE HEAD BAR (DRAPES) ×2 IMPLANT
DRAPE INCISE IOBAN 66X45 STRL (DRAPES) ×2 IMPLANT
DRAPE MICROSCOPE LEICA 54X105 (DRAPES) ×2 IMPLANT
DRAPE UTILITY XL STRL (DRAPES) ×1 IMPLANT
DRSG TEGADERM 4X4.75 (GAUZE/BANDAGES/DRESSINGS) ×3 IMPLANT
ELECT COATED BLADE 2.86 ST (ELECTRODE) ×2 IMPLANT
ELECT EMG 18 NIMS (NEUROSURGERY SUPPLIES) ×2
ELECT REM PT RETURN 9FT ADLT (ELECTROSURGICAL) ×2
ELECTRODE EMG 18 NIMS (NEUROSURGERY SUPPLIES) ×1 IMPLANT
ELECTRODE REM PT RTRN 9FT ADLT (ELECTROSURGICAL) ×1 IMPLANT
FORCEPS BIPOLAR SPETZLER 8 1.0 (NEUROSURGERY SUPPLIES) ×2 IMPLANT
GAUZE 4X4 16PLY RFD (DISPOSABLE) ×2 IMPLANT
GAUZE SPONGE 4X4 12PLY STRL (GAUZE/BANDAGES/DRESSINGS) ×2 IMPLANT
GENERATOR PULSE INSPIRE (Generator) ×2 IMPLANT
GENERATOR PULSE INSPIRE IV (Generator) ×1 IMPLANT
GLOVE BIO SURGEON STRL SZ 6.5 (GLOVE) IMPLANT
GLOVE BIO SURGEON STRL SZ7.5 (GLOVE) ×2 IMPLANT
GOWN STRL REUS W/ TWL LRG LVL3 (GOWN DISPOSABLE) ×3 IMPLANT
GOWN STRL REUS W/TWL LRG LVL3 (GOWN DISPOSABLE) ×6
KIT BASIN OR (CUSTOM PROCEDURE TRAY) ×2 IMPLANT
KIT NEUROSTIMULATOR ACCESSORY (KITS) IMPLANT
KIT TURNOVER KIT B (KITS) ×2 IMPLANT
LEAD SENSING RESP INSPIRE (Lead) ×2 IMPLANT
LEAD SENSING RESP INSPIRE IV (Lead) ×1 IMPLANT
LEAD SLEEP STIM INSPIRE IV/V (Lead) ×1 IMPLANT
LEAD SLEEP STIMULATION INSPIRE (Lead) ×2 IMPLANT
LOOP VESSEL MAXI BLUE (MISCELLANEOUS) ×2 IMPLANT
LOOP VESSEL MINI RED (MISCELLANEOUS) ×2 IMPLANT
MARKER SKIN DUAL TIP RULER LAB (MISCELLANEOUS) ×3 IMPLANT
NDL HYPO 25GX1X1/2 BEV (NEEDLE) ×1 IMPLANT
NEEDLE HYPO 25GX1X1/2 BEV (NEEDLE) ×2 IMPLANT
NS IRRIG 1000ML POUR BTL (IV SOLUTION) ×2 IMPLANT
PAD ARMBOARD 7.5X6 YLW CONV (MISCELLANEOUS) ×2 IMPLANT
PASSER CATH 38CM DISP (INSTRUMENTS) ×2 IMPLANT
PENCIL SMOKE EVACUATOR (MISCELLANEOUS) ×2 IMPLANT
POSITIONER HEAD DONUT 9IN (MISCELLANEOUS) ×2 IMPLANT
PROBE NERVE STIMULATOR (NEUROSURGERY SUPPLIES) ×2 IMPLANT
REMOTE CONTROL SLEEP INSPIRE (MISCELLANEOUS) ×2 IMPLANT
SET WALTER ACTIVATION W/DRAPE (SET/KITS/TRAYS/PACK) ×2 IMPLANT
SLING ARM FOAM STRAP LRG (SOFTGOODS) IMPLANT
SLING ARM FOAM STRAP MED (SOFTGOODS) IMPLANT
SPONGE INTESTINAL PEANUT (DISPOSABLE) ×2 IMPLANT
STAPLER VISISTAT 35W (STAPLE) ×1 IMPLANT
SUT SILK 2 0 SH (SUTURE) ×2 IMPLANT
SUT SILK 3 0 REEL (SUTURE) ×2 IMPLANT
SUT SILK 3 0 SH 30 (SUTURE) ×4 IMPLANT
SUT SILK 3-0 (SUTURE) ×2
SUT SILK 3-0 RB1 30XBRD (SUTURE) ×1
SUT VIC AB 3-0 SH 27 (SUTURE) ×4
SUT VIC AB 3-0 SH 27X BRD (SUTURE) ×2 IMPLANT
SUT VIC AB 4-0 PS2 27 (SUTURE) ×4 IMPLANT
SUTURE SILK 3-0 RB1 30XBRD (SUTURE) ×1 IMPLANT
SYR 10ML LL (SYRINGE) ×2 IMPLANT
TAPE CLOTH SURG 4X10 WHT LF (GAUZE/BANDAGES/DRESSINGS) ×1 IMPLANT
TAPE PAPER 2X10 WHT MICROPORE (GAUZE/BANDAGES/DRESSINGS) ×1 IMPLANT
TOWEL GREEN STERILE (TOWEL DISPOSABLE) ×2 IMPLANT
TRAY ENT MC OR (CUSTOM PROCEDURE TRAY) ×2 IMPLANT

## 2020-05-15 NOTE — Brief Op Note (Signed)
05/15/2020  11:13 AM  PATIENT:  Beckey Rutter Kerper  75 y.o. female  PRE-OPERATIVE DIAGNOSIS:  obstructive sleep apnea.  POST-OPERATIVE DIAGNOSIS:  obstructive sleep apnea.  PROCEDURE:  Procedure(s): IMPLANTATION OF HYPOGLOSSAL NERVE STIMULATOR (N/A)  SURGEON:  Surgeon(s) and Role:    * Melida Quitter, MD - Primary  PHYSICIAN ASSISTANT: Sallee Provencal, PA  ASSISTANTS: none   ANESTHESIA:   general  EBL: Minimal  BLOOD ADMINISTERED:none  DRAINS: none   LOCAL MEDICATIONS USED:  LIDOCAINE   SPECIMEN:  No Specimen  DISPOSITION OF SPECIMEN:  N/A  COUNTS:  YES  TOURNIQUET:  * No tourniquets in log *  DICTATION: .Note written in EPIC  PLAN OF CARE: Discharge to home after PACU  PATIENT DISPOSITION:  PACU - hemodynamically stable.   Delay start of Pharmacological VTE agent (>24hrs) due to surgical blood loss or risk of bleeding: yes

## 2020-05-15 NOTE — H&P (Signed)
Brittany [name] is an 75 y.o. female.   Chief Complaint: Sleep apnea HPI: 75 year old female with sleep apnea who has not been able to tolerate CPAP.  She presents for hypoglossal nerve stimulator implant.  Past Medical History:  Diagnosis Date  . A-fib ([place])   . Acid reflux   . Acute cystitis with positive culture 03/06/2014  . Anemia   . Anxiety   . Arthritis   . Baker cyst, left 04/06/2016   Aspirated 04/06/2016  . Breast cancer ([place]) 11/23/2007   right, ER/PR -, HER 2 -  . Cancer [facility])    breast right  triple recet neg, lumpect, chemo, XRT  . Cognitive deficits 09/25/2014   2016 Very mild, could be related to meds   . Degenerative arthritis of left knee 04/06/2016   Injection and aspiration 04/06/2016  . Degenerative arthritis of right knee 09/27/2015  . Depression   . Dysrhythmia    PAF  . Esophageal stricture   . ESOPHAGEAL STRICTURE 11/23/2007   Qualifier: Diagnosis of  By: [name], Dimas Millin Gastritis 10/2007  . History of chemotherapy    neoadjuvant chemo, last dose 03/2008  . History of radiation therapy 07/25/08 -09/11/08   right breast  . Hx of colonic polyps 08/11/2016  . Hyperlipidemia   . Irregular heart beat 10/13/2017   2019  . Migraines    rare  . Myalgia 07/18/2013   5/15 multiple site   . Osteopenia   . Other specified injury of right quadriceps muscle, fascia and tendon, initial encounter 09/14/2016  . Rupture of quadriceps tendon 03/23/2017  . Sleep apnea    severe OSA AHI 34, uses CPAP    Past Surgical History:  Procedure Laterality Date  . ATRIAL FIBRILLATION ABLATION N/A 10/07/2018   Procedure: ATRIAL FIBRILLATION ABLATION;  Surgeon: [name], MD;  Location: [place] [facility];  Service: Cardiovascular;  Laterality: N/A;  . BREAST LUMPECTOMY  06/26/2008   right, CA   XRT, chemo  . COLONOSCOPY    . DRUG INDUCED ENDOSCOPY N/A 02/21/2020   Procedure: DRUG INDUCED ENDOSCOPY;  Surgeon: Melida Quitter, MD;  Location: [place];   Service: ENT;  Laterality: N/A;  . EYE SURGERY     left eye cataract surgery 12/23/2015  . growth removed per right thigh     as teen - mole  . JOINT REPLACEMENT  12/31/2015   RTK  . QUADRICEPS TENDON REPAIR Right 09/14/2016   Procedure: OPEN REPAIR QUADRICEP TENDON;  Surgeon: [name], MD;  Location: [facility];  Service: Orthopedics;  Laterality: Right;  . TOTAL KNEE ARTHROPLASTY Right 12/31/2015   Procedure: RIGHT TOTAL KNEE ARTHROPLASTY;  Surgeon: [name], MD;  Location: [facility];  Service: Orthopedics;  Laterality: Right;  . UPPER GASTROINTESTINAL ENDOSCOPY      Family History  Problem Relation Age of Onset  . Heart disease Father   . Cancer Father        lung-nonsmoker  . Breast cancer Sister 66  . Colon cancer Sister 77  . Epilepsy Mother        at older age  . Dementia Mother        ?uncertain type  . Hip fracture Mother        x2  . Other Brother        suicide  . Other Maternal Grandfather        didn't go to doctor  . Esophageal cancer Neg Hx   .  Stomach cancer Neg Hx    Social History:  reports that she quit smoking about 53 years ago. Her smoking use included cigarettes. She has a 1.25 pack-year smoking history. She has never used smokeless tobacco. She reports current alcohol use of about 7.0 standard drinks of alcohol per week. She reports that she does not use drugs.  Allergies:  Allergies  Allergen Reactions  . Boniva [Ibandronic Acid] Nausea Only  . Lipitor [Atorvastatin] Other (See Comments)    Muscle and joint pain(s)    Facility-Administered Medications Prior to Admission  Medication Dose Route Frequency Provider Last Rate Last Admin  . 0.9 %  sodium chloride infusion  500 mL Intravenous Once Gatha Mayer, MD       Medications Prior to Admission  Medication Sig Dispense Refill  . apixaban (ELIQUIS) 5 MG TABS tablet Take 1 tablet (5 mg total) by mouth 2 (two) times daily. 180 tablet 1  . Biotin 5000 MCG TABS Take 5,000 mcg by mouth daily.     [name] buPROPion (WELLBUTRIN XL) 150 MG 24 hr tablet Take 1 tablet (150 mg total) by mouth daily. 90 tablet 1  . Calcium-Magnesium-Zinc (CAL-MAG-ZINC PO) Take 1 tablet by mouth daily.    . cetirizine (ZYRTEC) 10 MG tablet Take 10 mg by mouth daily. Patient takes Aller-Tec.    . Cholecalciferol (VITAMIN D) 50 MCG (2000 UT) tablet Take 2,000 Units by mouth daily.    . citalopram (CELEXA) 40 MG tablet TAKE 1 TABLET BY MOUTH EVERY DAY (Patient taking differently: Take 40 mg by mouth daily.) 90 tablet 0  . CRANBERRY PO Take 400 mg by mouth daily.    . diphenhydrAMINE (BENADRYL) 25 MG tablet Take 25 mg by mouth every 6 (six) hours as needed for allergies.    . famotidine (PEPCID) 40 MG tablet Take 1 tablet (40 mg total) by mouth daily. 90 tablet 3  . fluticasone (FLONASE) 50 MCG/ACT nasal spray Place 2 sprays into both nostrils daily. 16 g 6  . Omega-3 Fatty Acids (FISH OIL) 1200 MG CAPS Take 1,200 mg by mouth daily.    . Probiotic Product (PROBIOTIC DAILY PO) Take 1 tablet by mouth daily.    . SUMAtriptan (IMITREX) 100 MG tablet TAKE 1 TABLET BY MOUTH ONCE FOR 1 DOSE. MAY REPEAT IN 2 HOURS IF HEADACHE PERSISTS / RECURS (Patient taking differently: Take 100 mg by mouth every 2 (two) hours as needed for migraine.) 9 tablet 5  . VALERIAN ROOT PO Take 1,200 mg by mouth at bedtime.    . vitamin B-12 (CYANOCOBALAMIN) 1000 MCG tablet Take 1,000 mcg by mouth daily.    [name] denosumab (PROLIA) 60 MG/ML SOLN injection Inject 60 mg into the skin once. Administer in upper arm, thigh, or abdomen (Patient taking differently: Inject 60 mg into the skin every 6 (six) months. Administer in upper arm, thigh, or abdomen) 1.8 mL 1  . montelukast (SINGULAIR) 10 MG tablet Take 1 tablet (10 mg total) by mouth at bedtime. 90 tablet 1  . zolpidem (AMBIEN) 10 MG tablet TAKE 0.5-1 TABLETS (5-10 MG TOTAL) BY MOUTH AT BEDTIME AS NEEDED. FOR SLEEP (Patient taking differently: Take 5-10 mg by mouth at bedtime as needed for sleep.) 90 tablet 1     Results for orders placed or performed during the hospital encounter of 05/15/20 (from the past 48 hour(s))  Basic metabolic panel per protocol     Status: Abnormal   Collection Time: 05/15/20  7:32 AM  Result Value Ref Range  Sodium 137 135 - 145 mmol/L   Potassium 3.8 3.5 - 5.1 mmol/L   Chloride 104 98 - 111 mmol/L   CO2 26 22 - 32 mmol/L   Glucose, Bld 85 70 - 99 mg/dL    Comment: Glucose reference range applies only to samples taken after fasting for at least 8 hours.   BUN 10 8 - 23 mg/dL   Creatinine, Ser 0.58 0.44 - 1.00 mg/dL   Calcium 8.7 (L) 8.9 - 10.3 mg/dL   GFR, Estimated >60 >60 mL/min    Comment: (NOTE) Calculated using the CKD-EPI Creatinine Equation (2021)    Anion gap 7 5 - 15    Comment: Performed at [facility] [address], [place], [place] [address]  CBC per protocol     Status: None   Collection Time: 05/15/20  7:32 AM  Result Value Ref Range   WBC 4.5 4.0 - 10.5 K/uL   RBC 4.05 3.87 - 5.11 MIL/uL   Hemoglobin 12.7 12.0 - 15.0 g/dL   HCT 39.7 36.0 - 46.0 %   MCV 98.0 80.0 - 100.0 fL   MCH 31.4 26.0 - 34.0 pg   MCHC 32.0 30.0 - 36.0 g/dL   RDW 12.7 11.5 - 15.5 %   Platelets 253 150 - 400 K/uL   nRBC 0.0 0.0 - 0.2 %    Comment: Performed at [facility], [place] [address], [place], [place] [address]   No results found.  Review of Systems  All other systems reviewed and are negative.   Blood pressure (!) 123/50, pulse 60, temperature 97.9 F (36.6 C), temperature source Oral, resp. rate 17, height 5\' 6"  (1.676 m), weight 56.7 kg, last menstrual period 03/02/1994, SpO2 99 %. Physical Exam Constitutional:      Appearance: Normal appearance. She is normal weight.  HENT:     Head: Normocephalic and atraumatic.     Right Ear: External ear normal.     Left Ear: External ear normal.     Nose: Nose normal.     Mouth/Throat:     Mouth: Mucous membranes are moist.     Pharynx: Oropharynx is clear.  Eyes:     Extraocular Movements:  Extraocular movements intact.     Conjunctiva/sclera: Conjunctivae normal.     Pupils: Pupils are equal, round, and reactive to light.  Cardiovascular:     Rate and Rhythm: Normal rate.  Pulmonary:     Effort: Pulmonary effort is normal.  Musculoskeletal:     Cervical back: Normal range of motion.  Skin:    General: Skin is warm and dry.  Neurological:     General: No focal deficit present.     Mental Status: She is alert and oriented to person, place, and time.  Psychiatric:        Mood and Affect: Mood normal.        Behavior: Behavior normal.        Thought Content: Thought content normal.        Judgment: Judgment normal.      Assessment/Plan Obstructive sleep apnea  To OR for hypoglossal nerve stimulator placement.  Melida Quitter, MD 05/15/2020, 9:23 AM

## 2020-05-15 NOTE — Anesthesia Procedure Notes (Signed)
Procedure Name: Intubation Date/Time: 05/15/2020 9:39 AM Performed by: Hoy Morn, CRNA Pre-anesthesia Checklist: Patient identified, Emergency Drugs available, Suction available and Patient being monitored Patient Re-evaluated:Patient Re-evaluated prior to induction Oxygen Delivery Method: Circle system utilized Preoxygenation: Pre-oxygenation with 100% oxygen Induction Type: IV induction Ventilation: Mask ventilation without difficulty Laryngoscope Size: Miller and 2 Grade View: Grade II Tube type: Oral Tube size: 7.0 mm Number of attempts: 1 Airway Equipment and Method: Oral airway and Bougie stylet Placement Confirmation: ETT inserted through vocal cords under direct vision,  positive ETCO2 and breath sounds checked- equal and bilateral Tube secured with: Tape Dental Injury: Teeth and Oropharynx as per pre-operative assessment

## 2020-05-15 NOTE — Anesthesia Postprocedure Evaluation (Signed)
Anesthesia Post Note  Patient: Brittany [name]  Procedure(s) Performed: IMPLANTATION OF HYPOGLOSSAL NERVE STIMULATOR (N/A Chest)     Patient location during evaluation: PACU Anesthesia Type: General Level of consciousness: sedated Pain management: pain level controlled Vital Signs Assessment: post-procedure vital signs reviewed and stable Respiratory status: spontaneous breathing and respiratory function stable Cardiovascular status: stable Postop Assessment: no apparent nausea or vomiting Anesthetic complications: no   No complications documented.  Last Vitals:  Vitals:   05/15/20 1250 05/15/20 1305  BP: 118/70 107/76  Pulse: 94 92  Resp: (!) 22 19  Temp:  (!) 36.3 C  SpO2: 94% 95%    Last Pain:  Vitals:   05/15/20 1300  TempSrc:   PainSc: 7                  Chantil Bari DANIEL

## 2020-05-15 NOTE — Op Note (Signed)

## 2020-05-15 NOTE — Transfer of Care (Signed)
Immediate Anesthesia Transfer of Care Note  Patient: Brittany [name]  Procedure(s) Performed: IMPLANTATION OF HYPOGLOSSAL NERVE STIMULATOR (N/A Chest)  Patient Location: PACU  Anesthesia Type:General  Level of Consciousness: awake, alert , oriented, patient cooperative and responds to stimulation  Airway & Oxygen Therapy: Patient Spontanous Breathing and Patient connected to face mask oxygen  Post-op Assessment: Report given to RN and Post -op Vital signs reviewed and stable  Post vital signs: Reviewed and stable  Last Vitals:  Vitals Value Taken Time  BP    Temp    Pulse 94 05/15/20 1135  Resp 18 05/15/20 1135  SpO2 100 % 05/15/20 1135  Vitals shown include unvalidated device data.  Last Pain:  Vitals:   05/15/20 0704  TempSrc: Oral         Complications: No complications documented.

## 2020-05-16 ENCOUNTER — Encounter (HOSPITAL_COMMUNITY): Payer: Self-pay | Admitting: Otolaryngology

## 2020-05-27 ENCOUNTER — Ambulatory Visit
Admission: RE | Admit: 2020-05-27 | Discharge: 2020-05-27 | Disposition: A | Discharge status: Home / Self Care | Payer: PPO | Source: Ambulatory Visit | Attending: Family Medicine | Admitting: Family Medicine

## 2020-05-27 DIAGNOSIS — R9389 Abnormal findings on diagnostic imaging of other specified body structures: Secondary | ICD-10-CM

## 2020-05-27 DIAGNOSIS — R918 Other nonspecific abnormal finding of lung field: Secondary | ICD-10-CM

## 2020-05-27 DIAGNOSIS — J984 Other disorders of lung: Secondary | ICD-10-CM | POA: Diagnosis not present

## 2020-05-27 DIAGNOSIS — R911 Solitary pulmonary nodule: Secondary | ICD-10-CM | POA: Diagnosis not present

## 2020-05-27 MED ORDER — IOPAMIDOL (ISOVUE-300) INJECTION 61%
75.0000 mL | Freq: Once | INTRAVENOUS | Status: AC | PRN
  Administered 2020-05-27: 75 mL via INTRAVENOUS

## 2020-05-28 ENCOUNTER — Telehealth: Payer: Self-pay | Admitting: Family Medicine

## 2020-05-28 NOTE — Telephone Encounter (Signed)
Patient is calling and wanted to see if the provider can call her back with her CT results, please advise. CB is [phone]

## 2020-05-29 NOTE — Telephone Encounter (Signed)
Result note created

## 2020-05-30 NOTE — Telephone Encounter (Signed)
Left a detailed message with the results at the pts home number.

## 2020-06-01 ENCOUNTER — Encounter: Payer: Self-pay | Admitting: Family Medicine

## 2020-06-03 ENCOUNTER — Other Ambulatory Visit: Payer: Self-pay

## 2020-06-03 ENCOUNTER — Encounter: Payer: Self-pay | Admitting: Family Medicine

## 2020-06-03 ENCOUNTER — Ambulatory Visit (INDEPENDENT_AMBULATORY_CARE_PROVIDER_SITE_OTHER): Payer: PPO | Admitting: Family Medicine

## 2020-06-03 VITALS — BP 110/78 | HR 75 | Temp 98.2°F | Wt 125.0 lb

## 2020-06-03 DIAGNOSIS — M7122 Synovial cyst of popliteal space [Baker], left knee: Secondary | ICD-10-CM

## 2020-06-03 NOTE — Progress Notes (Signed)
   Subjective:    Patient ID: Brittany [name], female    DOB: 01-May-1945, 75 y.o.   MRN: [id number]  HPI Here for what she thinks is another Baker's cyst behind the left knee. She had one of these behind the right knee in 2017, and this was aspirated twice. However each time it can back, and was was removed of course during her total knee replacement surgery. Now for the last 3 months she has a similar lump behind the left knee which has been painful. She takes Tylenol at times for it. No recent trauma to the knee.    Review of Systems  Constitutional: Negative.   Respiratory: Negative.   Cardiovascular: Negative.   Musculoskeletal: Positive for arthralgias.       Objective:   Physical Exam Constitutional:      Appearance: Normal appearance.  Cardiovascular:     Rate and Rhythm: Normal rate and regular rhythm.     Pulses: Normal pulses.     Heart sounds: Normal heart sounds.  Pulmonary:     Effort: Pulmonary effort is normal.     Breath sounds: Normal breath sounds.  Musculoskeletal:     Comments: There is a tender fluctuant swelling behind the left knee. No erythema or warmth. ROM is full   Neurological:     Mental Status: She is alert.           Assessment & Plan:  Baker's cyst. We will refer her to Orthopedics for treatment.  Alysia Penna, MD

## 2020-06-11 ENCOUNTER — Ambulatory Visit (INDEPENDENT_AMBULATORY_CARE_PROVIDER_SITE_OTHER): Payer: PPO

## 2020-06-11 ENCOUNTER — Ambulatory Visit: Payer: PPO | Admitting: Cardiology

## 2020-06-11 ENCOUNTER — Ambulatory Visit: Payer: PPO | Admitting: Orthopaedic Surgery

## 2020-06-11 ENCOUNTER — Encounter: Payer: Self-pay | Admitting: Cardiology

## 2020-06-11 ENCOUNTER — Other Ambulatory Visit: Payer: Self-pay

## 2020-06-11 VITALS — BP 118/64 | HR 88 | Ht 66.0 in | Wt 125.0 lb

## 2020-06-11 DIAGNOSIS — M1712 Unilateral primary osteoarthritis, left knee: Secondary | ICD-10-CM | POA: Diagnosis not present

## 2020-06-11 DIAGNOSIS — I480 Paroxysmal atrial fibrillation: Secondary | ICD-10-CM | POA: Diagnosis not present

## 2020-06-11 NOTE — Patient Instructions (Signed)
Medication Instructions:  °Your physician recommends that you continue on your current medications as directed. Please refer to the Current Medication list given to you today. ° °*If you need a refill on your cardiac medications before your next appointment, please call your pharmacy* ° ° °Lab Work: °None ordered ° ° °Testing/Procedures: °None ordered ° ° °Follow-Up: °At [facility], you and your health needs are our priority.  As part of our continuing mission to provide you with exceptional heart care, we have created designated Provider Care Teams.  These Care Teams include your primary Cardiologist (physician) and Advanced Practice Providers (APPs -  Physician Assistants and Nurse Practitioners) who all work together to provide you with the care you need, when you need it. ° °Your next appointment:   °1 year(s) ° °The format for your next appointment:   °In Person ° °Provider:   °Will Camnitz, MD ° ° ° °Thank you for choosing [facility]!! ° ° °Jayshaun Phillips, RN °[phone] ° ° ° °

## 2020-06-11 NOTE — Progress Notes (Signed)
Electrophysiology Office Note   Date:  06/11/2020   ID:  Brittany [name], DOB [birth year], MRN [id number]  PCP:  Wynn Banker, MD  Cardiologist:  Anne Fu Primary Electrophysiologist:  Regan Lemming, MD    No chief complaint on file.    History of Present Illness: Brittany [name] is a 75 y.o. female who is being seen today for the evaluation of atrial fibrillation at the request of Wynn Banker, MD. Presenting today for electrophysiology evaluation.    She has a history significant for atrial fibrillation, breast cancer status post lumpectomy, chemo, and XRT.  She wore a cardiac monitor that showed brief episodes of atrial fibrillation.  She had had palpitations for many years.  She had an exercise treadmill test that showed an episode of SVT for which she was asymptomatic.  She went to the emergency room 04/18/2018 with rapid atrial fibrillation.  She is status post AF ablation 10/07/2018.  Today, denies symptoms of palpitations, chest pain, shortness of breath, orthopnea, PND, lower extremity edema, claudication, dizziness, presyncope, syncope, bleeding, or neurologic sequela. The patient is tolerating medications without difficulties.  Since last being seen she has done well.  She has no chest pain or shortness of breath.  She Brittany Kozar do all of her daily activities without restriction.  Her only problem is her left knee.  She has a Baker's cyst and is following with orthopedics.  Past Medical History:  Diagnosis Date  . A-fib (HCC)   . Acid reflux   . Acute cystitis with positive culture 03/06/2014  . Anemia   . Anxiety   . Arthritis   . Baker cyst, left 04/06/2016   Aspirated 04/06/2016  . Breast cancer (HCC) 11/23/2007   right, ER/PR -, HER 2 -  . Cancer Brittany [name])    breast right  triple recet neg, lumpect, chemo, XRT  . Cognitive deficits 09/25/2014   2016 Very mild, could be related to meds   . Degenerative arthritis of left knee 04/06/2016   Injection and aspiration  04/06/2016  . Degenerative arthritis of right knee 09/27/2015  . Depression   . Dysrhythmia    PAF  . Esophageal stricture   . ESOPHAGEAL STRICTURE 11/23/2007   Qualifier: Diagnosis of  By: Leone Payor MD, Charlyne Quale Gastritis 10/2007  . History of chemotherapy    neoadjuvant chemo, last dose 03/2008  . History of radiation therapy 07/25/08 -09/11/08   right breast  . Hx of colonic polyps 08/11/2016  . Hyperlipidemia   . Irregular heart beat 10/13/2017   2019  . Migraines    rare  . Myalgia 07/18/2013   5/15 multiple site   . Osteopenia   . Other specified injury of right quadriceps muscle, fascia and tendon, initial encounter 09/14/2016  . Rupture of quadriceps tendon 03/23/2017  . Sleep apnea    severe OSA AHI 34, uses CPAP   Past Surgical History:  Procedure Laterality Date  . ATRIAL FIBRILLATION ABLATION N/A 10/07/2018   Procedure: ATRIAL FIBRILLATION ABLATION;  Surgeon: Regan Lemming, MD;  Location: [facility];  Service: Cardiovascular;  Laterality: N/A;  . BREAST LUMPECTOMY  06/26/2008   right, CA   XRT, chemo  . COLONOSCOPY    . DRUG INDUCED ENDOSCOPY N/A 02/21/2020   Procedure: DRUG INDUCED ENDOSCOPY;  Surgeon: Christia Reading, MD;  Location: [name];  Service: ENT;  Laterality: N/A;  . EYE SURGERY     left eye cataract surgery 12/23/2015  .  growth removed per right thigh     as teen - mole  . IMPLANTATION OF HYPOGLOSSAL NERVE STIMULATOR N/A 05/15/2020   Procedure: IMPLANTATION OF HYPOGLOSSAL NERVE STIMULATOR;  Surgeon: Christia Reading, MD;  Location: [name];  Service: ENT;  Laterality: N/A;  . JOINT REPLACEMENT  12/31/2015   RTK  . QUADRICEPS TENDON REPAIR Right 09/14/2016   Procedure: OPEN REPAIR QUADRICEP TENDON;  Surgeon: Durene Romans, MD;  Location: [facility];  Service: Orthopedics;  Laterality: Right;  . TOTAL KNEE ARTHROPLASTY Right 12/31/2015   Procedure: RIGHT TOTAL KNEE ARTHROPLASTY;  Surgeon: Durene Romans, MD;  Location: [facility];  Service:  Orthopedics;  Laterality: Right;  . UPPER GASTROINTESTINAL ENDOSCOPY       Current Outpatient Medications  Medication Sig Dispense Refill  . apixaban (ELIQUIS) 5 MG TABS tablet Take 1 tablet (5 mg total) by mouth 2 (two) times daily. 180 tablet 1  . Biotin 5000 MCG TABS Take 5,000 mcg by mouth daily.    [name] buPROPion (WELLBUTRIN XL) 150 MG 24 hr tablet Take 1 tablet (150 mg total) by mouth daily. 90 tablet 1  . Calcium-Magnesium-Zinc (CAL-MAG-ZINC PO) Take 1 tablet by mouth daily.    . Cholecalciferol (VITAMIN D) 50 MCG (2000 UT) tablet Take 2,000 Units by mouth daily.    . citalopram (CELEXA) 40 MG tablet TAKE 1 TABLET BY MOUTH EVERY DAY (Patient taking differently: Take 40 mg by mouth daily.) 90 tablet 0  . CRANBERRY PO Take 400 mg by mouth daily.    [name] denosumab (PROLIA) 60 MG/ML SOLN injection Inject 60 mg into the skin once. Administer in upper arm, thigh, or abdomen (Patient taking differently: Inject 60 mg into the skin every 6 (six) months. Administer in upper arm, thigh, or abdomen) 1.8 mL 1  . diphenhydrAMINE (BENADRYL) 25 MG tablet Take 25 mg by mouth every 6 (six) hours as needed for allergies.    . famotidine (PEPCID) 40 MG tablet Take 1 tablet (40 mg total) by mouth daily. 90 tablet 3  . fluticasone (FLONASE) 50 MCG/ACT nasal spray Place 2 sprays into both nostrils daily. 16 g 6  . montelukast (SINGULAIR) 10 MG tablet Take 1 tablet (10 mg total) by mouth at bedtime. 90 tablet 1  . Omega-3 Fatty Acids (FISH OIL) 1200 MG CAPS Take 1,200 mg by mouth daily.    . Probiotic Product (PROBIOTIC DAILY PO) Take 1 tablet by mouth daily.    . SUMAtriptan (IMITREX) 100 MG tablet TAKE 1 TABLET BY MOUTH ONCE FOR 1 DOSE. MAY REPEAT IN 2 HOURS IF HEADACHE PERSISTS / RECURS (Patient taking differently: Take 100 mg by mouth every 2 (two) hours as needed for migraine.) 9 tablet 5  . VALERIAN ROOT PO Take 1,200 mg by mouth at bedtime.    . vitamin B-12 (CYANOCOBALAMIN) 1000 MCG tablet Take 1,000 mcg by  mouth daily.    [name] zolpidem (AMBIEN) 10 MG tablet TAKE 0.5-1 TABLETS (5-10 MG TOTAL) BY MOUTH AT BEDTIME AS NEEDED. FOR SLEEP (Patient taking differently: Take 5-10 mg by mouth at bedtime as needed for sleep.) 90 tablet 1   Current Facility-Administered Medications  Medication Dose Route Frequency Provider Last Rate Last Admin  . 0.9 %  sodium chloride infusion  500 mL Intravenous Once Iva Boop, MD        Allergies:   Sandrea Hammond [ibandronic acid] and Lipitor [atorvastatin]   Social History:  The patient  reports that she quit smoking about 53 years ago. Her smoking use included  cigarettes. She has a 1.25 pack-year smoking history. She has never used smokeless tobacco. She reports current alcohol use of about 7.0 standard drinks of alcohol per week. She reports that she does not use drugs.   Family History:  The patient's family history includes Breast cancer (age of onset: 50) in her sister; Cancer in her father; Colon cancer (age of onset: 72) in her sister; Dementia in her mother; Epilepsy in her mother; Heart disease in her father; Hip fracture in her mother; Other in her brother and maternal grandfather.   ROS:  Please see the history of present illness.   Otherwise, review of systems is positive for none.   All other systems are reviewed and negative.   PHYSICAL EXAM: VS:  BP 118/64   Pulse 88   Ht 5\' 6"  (1.676 m)   Wt 125 lb (56.7 kg)   LMP 03/02/1994 (Approximate)   BMI 20.18 kg/m  , BMI Body mass index is 20.18 kg/m. GEN: Well nourished, well developed, in no acute distress  HEENT: normal  Neck: no JVD, carotid bruits, or masses Cardiac: RRR; no murmurs, rubs, or gallops,no edema  Respiratory:  clear to auscultation bilaterally, normal work of breathing GI: soft, nontender, nondistended, + BS MS: no deformity or atrophy  Skin: warm and dry Neuro:  Strength and sensation are intact Psych: euthymic mood, full affect  EKG:  EKG is ordered today. Personal review of the ekg  ordered shows sinus rhythm, rate 88.    Recent Labs: 07/24/2019: TSH 1.44 04/11/2020: ALT 10 05/15/2020: BUN 10; Creatinine, Ser 0.58; Hemoglobin 12.7; Platelets 253; Potassium 3.8; Sodium 137    Lipid Panel     Component Value Date/Time   CHOL 213 (H) 07/24/2019 0946   TRIG 68.0 07/24/2019 0946   HDL 53.90 07/24/2019 0946   CHOLHDL 4 07/24/2019 0946   VLDL 13.6 07/24/2019 0946   LDLCALC 146 (H) 07/24/2019 0946   LDLDIRECT 178.9 03/23/2013 1059     Wt Readings from Last 3 Encounters:  06/11/20 125 lb (56.7 kg)  06/03/20 125 lb (56.7 kg)  05/15/20 125 lb (56.7 kg)      Other studies Reviewed: Additional studies/ records that were reviewed today include: TTE 12/29/17  Review of the above records today demonstrates:  - Left ventricle: The cavity size was normal. Granberg thickness was   normal. Systolic function was normal. The estimated ejection   fraction was in the range of 55% to 60%. Left ventricular   diastolic function parameters were normal. - Aortic valve: Sclerosis without stenosis. - Mitral valve: Leaflets are severely thickened and calcified with   posterior leaflet prolapse but only trivial MR. - Atrial septum: A patent foramen ovale cannot be excluded. - Left atrium:  The atrium was normal in size.  30 day monitor 01/24/18 - personally reviewed  Brief episode of paroxysmal atrial fibrillation noted with maximum heart rate 163 bpm. Episode lasted approximately 2 minutes.  Occasional ventricular couplets. Occasional PVCs  Slow ventricular run of approximately 79 bpm, 7 beats. Asymptomatic.  Brief 10 beats of paroxysmal atrial tachycardia   ASSESSMENT AND PLAN:  1.  Paroxysmal atrial fibrillation: Currently on Eliquis.  Status post ablation 10/07/2018.  CHA2DS2-VASc of 2.  She remains in sinus rhythm.  I told her that if she would like she can get off of her Eliquis.  She Eliav Mechling continue for now.  No changes.  2.  Obstructive sleep apnea: CPAP compliance  encouraged  Current medicines are reviewed at length with  the patient today.   The patient does not have concerns regarding her medicines.  The following changes were made today: None  Labs/ tests ordered today include:  Orders Placed This Encounter  Procedures  . EKG 12-Lead    Disposition:   FU with Kalinda Romaniello 12 months  Signed, Quindell Shere Jorja Loa, MD  06/11/2020 2:48 PM     [facility] [address] [place] [address] [phone] (office) [phone] (fax)

## 2020-06-11 NOTE — Progress Notes (Signed)
Office Visit Note   Patient: Brittany [name]           Date of Birth: [birth year]           MRN: [id number] Visit Date: 06/11/2020              Requested by: Laurey Morale, MD [place],  [place] [address] PCP: [name], MD   Assessment & Plan: Visit Diagnoses:  1. Primary osteoarthritis of left knee     Plan: Overall her symptoms stem from the Baker's cyst which is quite irritating.  Her arthritis and her symptoms are not to the point where she needs a knee replacement.  Given her options of either living with it or trying an ultrasound-guided aspiration cortisone injection she has elected to try the procedure.  We will make a referral to Dr. Junius Roads for this procedure.  We will see her back as needed.  Follow-Up Instructions: Return for needs appt with [facility] to have left Baker's cyst aspirated and injected.   Orders:  Orders Placed This Encounter  Procedures  . XR KNEE 3 VIEW LEFT   No orders of the defined types were placed in this encounter.     Procedures: No procedures performed   Clinical Data: No additional findings.   Subjective: Chief Complaint  Patient presents with  . Left Knee - Pain    Lakiya is a very active and pleasant 75 year old female comes in for valuation of a separate problem.  I saw her about a year ago for a shoulder problem which resolved after a glenohumeral injection.  She takes Eliquis for atrial fibrillation.  She states that she started having more pain in the back of the knee that is caused by a Baker's cyst which she originally had in the right knee.  She did not have a good experience with her right knee replacement so she is trying to avoid a knee replacement with the left knee.  She is still very active and not really limited by her left knee pain.  She denies any mechanical symptoms.  She feels like she is starting to have some balance issues because the left knee feels little shaky.  She has not worn any knee  braces or had any injuries or surgeries to the left knee.   Review of Systems  Constitutional: Negative.   HENT: Negative.   Eyes: Negative.   Respiratory: Negative.   Cardiovascular: Negative.   Endocrine: Negative.   Musculoskeletal: Negative.   Neurological: Negative.   Hematological: Negative.   Psychiatric/Behavioral: Negative.   All other systems reviewed and are negative.    Objective: Vital Signs: LMP 03/02/1994 (Approximate)   Physical Exam Vitals and nursing note reviewed.  Constitutional:      Appearance: She is well-developed.  HENT:     Head: Normocephalic and atraumatic.  Pulmonary:     Effort: Pulmonary effort is normal.  Abdominal:     Palpations: Abdomen is soft.  Musculoskeletal:     Cervical back: Neck supple.  Skin:    General: Skin is warm.     Capillary Refill: Capillary refill takes less than 2 seconds.  Neurological:     Mental Status: She is alert and oriented to person, place, and time.  Psychiatric:        Behavior: Behavior normal.        Thought Content: Thought content normal.        Judgment: Judgment normal.  Ortho Exam Left knee shows a firm palpable mass in the popliteal fossa consistent with a Baker's cyst.  There is no overlying skin changes.  She has fairly normal range of motion.  Because her cruciates are stable.  No joint line tenderness.  Mild patellofemoral crepitus with range of motion. Specialty Comments:  No specialty comments available.  Imaging: XR KNEE 3 VIEW LEFT  Result Date: 06/11/2020 Moderate degenerative changes to the patellofemoral and lateral compartments.  Medial compartment appears to be well preserved.    PMFS History: Patient Active Problem List   Diagnosis Date Noted  . Atrial fibrillation ([place]) 05/13/2018  . Bloating 04/11/2018  . Abnormal EKG 08/23/2017  . Malignant neoplasm of overlapping sites of right breast in female, estrogen receptor positive ([place]) 08/17/2016  . Hx of colonic  polyps 08/11/2016  . Iron deficiency anemia 07/28/2016  . Blood donor 07/28/2016  . Pain due to total right knee replacement ([place]) 04/06/2016  . 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 Baker's cyst, unruptured 09/27/2015  . Insomnia 07/31/2015  . Onychomycosis 09/25/2014  . Urinary frequency 05/21/2014  . Mitral valve prolapse 05/08/2014  . Mitral regurgitation 05/08/2014  . Dyslipidemia 02/09/2014  . History of radiation therapy   . Hiatal hernia   . History of chemotherapy   . Osteoporosis 03/21/2012  . Seasonal and perennial allergic rhinitis 12/17/2011  . B12 deficiency 10/09/2009  . TOBACCO USE, QUIT 01/31/2009  . THYROID NODULE 12/25/2008  . GERD 11/23/2007  . VARICOSE VEIN 12/29/2006  . Anxiety state 12/23/2006  . Depression 12/23/2006  . Migraine variant 12/23/2006  . Disorder of bone and cartilage 12/23/2006   Past Medical History:  Diagnosis Date  . A-fib ([place])   . Acid reflux   . Acute cystitis with positive culture 03/06/2014  . Anemia   . Anxiety   . Arthritis   . Baker cyst, left 04/06/2016   Aspirated 04/06/2016  . Breast cancer ([place]) 11/23/2007   right, ER/PR -, HER 2 -  . Cancer [facility])    breast right  triple recet neg, lumpect, chemo, XRT  . Cognitive deficits 09/25/2014   2016 Very mild, could be related to meds   . Degenerative arthritis of left knee 04/06/2016   Injection and aspiration 04/06/2016  . Degenerative arthritis of right knee 09/27/2015  . Depression   . Dysrhythmia    PAF  . Esophageal stricture   . ESOPHAGEAL STRICTURE 11/23/2007   Qualifier: Diagnosis of  By: [name], Dimas Millin Gastritis 10/2007  . History of chemotherapy    neoadjuvant chemo, last dose 03/2008  . History of radiation therapy 07/25/08 -09/11/08   right breast  . Hx of colonic polyps 08/11/2016  . Hyperlipidemia   . Irregular heart beat 10/13/2017   2019  . Migraines    rare  . Myalgia 07/18/2013   5/15 multiple site   . Osteopenia   . Other specified injury of right  quadriceps muscle, fascia and tendon, initial encounter 09/14/2016  . Rupture of quadriceps tendon 03/23/2017  . Sleep apnea    severe OSA AHI 34, uses CPAP    Family History  Problem Relation Age of Onset  . Heart disease Father   . Cancer Father        lung-nonsmoker  . Breast cancer Sister 47  . Colon cancer Sister 51  . Epilepsy Mother        at older age  . Dementia Mother        ?  uncertain type  . Hip fracture Mother        x2  . Other Brother        suicide  . Other Maternal Grandfather        didn't go to doctor  . Esophageal cancer Neg Hx   . Stomach cancer Neg Hx     Past Surgical History:  Procedure Laterality Date  . ATRIAL FIBRILLATION ABLATION N/A 10/07/2018   Procedure: ATRIAL FIBRILLATION ABLATION;  Surgeon: [name], MD;  Location: [facility];  Service: Cardiovascular;  Laterality: N/A;  . BREAST LUMPECTOMY  06/26/2008   right, CA   XRT, chemo  . COLONOSCOPY    . DRUG INDUCED ENDOSCOPY N/A 02/21/2020   Procedure: DRUG INDUCED ENDOSCOPY;  Surgeon: Melida Quitter, MD;  Location: [facility];  Service: ENT;  Laterality: N/A;  . EYE SURGERY     left eye cataract surgery 12/23/2015  . growth removed per right thigh     as teen - mole  . IMPLANTATION OF HYPOGLOSSAL NERVE STIMULATOR N/A 05/15/2020   Procedure: IMPLANTATION OF HYPOGLOSSAL NERVE STIMULATOR;  Surgeon: Melida Quitter, MD;  Location: [facility];  Service: ENT;  Laterality: N/A;  . JOINT REPLACEMENT  12/31/2015   RTK  . QUADRICEPS TENDON REPAIR Right 09/14/2016   Procedure: OPEN REPAIR QUADRICEP TENDON;  Surgeon: [name], MD;  Location: [facility];  Service: Orthopedics;  Laterality: Right;  . TOTAL KNEE ARTHROPLASTY Right 12/31/2015   Procedure: RIGHT TOTAL KNEE ARTHROPLASTY;  Surgeon: [name], MD;  Location: [facility];  Service: Orthopedics;  Laterality: Right;  . UPPER GASTROINTESTINAL ENDOSCOPY     Social History   Occupational History    Employer: RETIRED  Tobacco  Use  . Smoking status: Former Smoker    Packs/day: 0.25    Years: 5.00    Pack years: 1.25    Types: Cigarettes    Quit date: 03/03/1967    Years since quitting: 53.3  . Smokeless tobacco: Never Used  . Tobacco comment: 6 cig per day when she was smoking/quit years ago  Vaping Use  . Vaping Use: Never used  Substance and Sexual Activity  . Alcohol use: Yes    Alcohol/week: 7.0 standard drinks    Types: 7 Cans of beer per week    Comment: 1 beer per day   . Drug use: No  . Sexual activity: Yes    Partners: Male    Birth control/protection: Post-menopausal

## 2020-06-12 ENCOUNTER — Other Ambulatory Visit: Payer: Self-pay | Admitting: Podiatry

## 2020-06-12 ENCOUNTER — Other Ambulatory Visit: Payer: Self-pay | Admitting: Internal Medicine

## 2020-06-12 NOTE — Telephone Encounter (Signed)
Please advise 

## 2020-06-13 ENCOUNTER — Ambulatory Visit: Payer: PPO | Admitting: Family Medicine

## 2020-06-13 ENCOUNTER — Ambulatory Visit: Payer: Self-pay

## 2020-06-13 DIAGNOSIS — M1712 Unilateral primary osteoarthritis, left knee: Secondary | ICD-10-CM

## 2020-06-13 NOTE — Telephone Encounter (Signed)
Should not refill for 1 year

## 2020-06-13 NOTE — Progress Notes (Signed)
Subjective: She is here for a planned left knee Baker's cyst aspiration and injection.  She had the same thing done for the right knee several years ago and unfortunately it led to knee replacement.  It did not go well for her.  Objective: She has a prominent Baker's cyst.  Procedure: Ultrasound-guided left Baker's cyst aspiration and injection: After sterile prep with Betadine, injected 3 cc 0.25% bupivacaine and aspirated 14 cc clear yellow synovial fluid, then injected 40 mg Depo-Medrol.

## 2020-06-17 ENCOUNTER — Encounter: Payer: Self-pay | Admitting: Orthopaedic Surgery

## 2020-06-17 ENCOUNTER — Other Ambulatory Visit: Payer: Self-pay | Admitting: Cardiology

## 2020-06-17 NOTE — Telephone Encounter (Signed)
Pt's age 75, wt 56.7 kg, SCr 0.58, CrCl 78.48, last ov w/ WC 06/11/20.

## 2020-06-18 ENCOUNTER — Encounter: Payer: Self-pay | Admitting: Cardiology

## 2020-06-18 NOTE — Progress Notes (Signed)
Date:  06/19/2020   ID:  Brittany [name], DOB 10-08-1945, MRN [id number]   PCP:  [name], MD  Cardiologist:  Candee Furbish, MD  Sleep Medicine:  [name], MD Electrophysiologist:  Will Meredith Leeds, MD   Chief Complaint:  OSA  History of Present Illness:    Brittany [name] is a 75 y.o. female who is followed by Dr. Marlou Porch for atrial fibrillation who was referred for sleep study.  She underwent home sleep study which showed severe obstructive sleep apnea with an AHI of 34.4/h and moderate oxygen desaturations to 81%.  She underwent CPAP titration to 12 cm H2O.   When I saw her last she was not doing well with her PAP device and I referred her to ENT for evaluation for the hypoglossal nerve stimulator.  She underwent Inspire device implantation 05/15/2020.  She is now here for initial activation of her device. She is doing well post op.  Her incisions have healed nicely without any problems.  She is nervous about understanding all she needs to know about her device.    Prior CV studies:   The following studies were reviewed today:  none  Past Medical History:  Diagnosis Date  . A-fib ([place])   . Acid reflux   . Acute cystitis with positive culture 03/06/2014  . Anemia   . Anxiety   . Arthritis   . Baker cyst, left 04/06/2016   Aspirated 04/06/2016  . Breast cancer ([place]) 11/23/2007   right, ER/PR -, HER 2 -  . Cancer [facility])    breast right  triple recet neg, lumpect, chemo, XRT  . Cognitive deficits 09/25/2014   2016 Very mild, could be related to meds   . Degenerative arthritis of left knee 04/06/2016   Injection and aspiration 04/06/2016  . Degenerative arthritis of right knee 09/27/2015  . Depression   . Dysrhythmia    PAF  . Esophageal stricture   . ESOPHAGEAL STRICTURE 11/23/2007   Qualifier: Diagnosis of  By: [name], Dimas Millin Gastritis 10/2007  . History of chemotherapy    neoadjuvant chemo, last dose 03/2008  . History of radiation therapy 07/25/08  -09/11/08   right breast  . Hx of colonic polyps 08/11/2016  . Hyperlipidemia   . Irregular heart beat 10/13/2017   2019  . Migraines    rare  . Myalgia 07/18/2013   5/15 multiple site   . OSA (obstructive sleep apnea)    severe OSA AHI 34, intolerant to PAP and now s/p hypoglossal nerve stimulator implant  . Osteopenia   . Other specified injury of right quadriceps muscle, fascia and tendon, initial encounter 09/14/2016  . Rupture of quadriceps tendon 03/23/2017   Past Surgical History:  Procedure Laterality Date  . ATRIAL FIBRILLATION ABLATION N/A 10/07/2018   Procedure: ATRIAL FIBRILLATION ABLATION;  Surgeon: [name], MD;  Location: [place] [facility];  Service: Cardiovascular;  Laterality: N/A;  . BREAST LUMPECTOMY  06/26/2008   right, CA   XRT, chemo  . COLONOSCOPY    . DRUG INDUCED ENDOSCOPY N/A 02/21/2020   Procedure: DRUG INDUCED ENDOSCOPY;  Surgeon: Melida Quitter, MD;  Location: [place];  Service: ENT;  Laterality: N/A;  . EYE SURGERY     left eye cataract surgery 12/23/2015  . growth removed per right thigh     as teen - mole  . IMPLANTATION OF HYPOGLOSSAL NERVE STIMULATOR N/A 05/15/2020   Procedure: IMPLANTATION OF HYPOGLOSSAL  NERVE STIMULATOR;  Surgeon: Melida Quitter, MD;  Location: [facility];  Service: ENT;  Laterality: N/A;  . JOINT REPLACEMENT  12/31/2015   RTK  . QUADRICEPS TENDON REPAIR Right 09/14/2016   Procedure: OPEN REPAIR QUADRICEP TENDON;  Surgeon: [name], MD;  [facility];  Service: Orthopedics;  Laterality: Right;  . TOTAL KNEE ARTHROPLASTY Right 12/31/2015   Procedure: RIGHT TOTAL KNEE ARTHROPLASTY;  Surgeon: [name], MD;  [facility];  Service: Orthopedics;  Laterality: Right;  . UPPER GASTROINTESTINAL ENDOSCOPY       No outpatient medications have been marked as taking for the 06/19/20 encounter (Office Visit) with [name], MD.   Current Facility-Administered Medications for the 06/19/20 encounter  (Office Visit) with [name], MD  Medication  . 0.9 %  sodium chloride infusion     Allergies:   Boniva [ibandronic acid] and Lipitor [atorvastatin]   Social History   Tobacco Use  . Smoking status: Former Smoker    Packs/day: 0.25    Years: 5.00    Pack years: 1.25    Types: Cigarettes    Quit date: 03/03/1967    Years since quitting: 53.3  . Smokeless tobacco: Never Used  . Tobacco comment: 6 cig per day when she was smoking/quit years ago  Vaping Use  . Vaping Use: Never used  Substance Use Topics  . Alcohol use: Yes    Alcohol/week: 7.0 standard drinks    Types: 7 Cans of beer per week    Comment: 1 beer per day   . Drug use: No     Family Hx: The patient's family history includes Breast cancer (age of onset: 70) in her sister; Cancer in her father; Colon cancer (age of onset: 14) in her sister; Dementia in her mother; Epilepsy in her mother; Heart disease in her father; Hip fracture in her mother; Other in her brother and maternal grandfather. There is no history of Esophageal cancer or Stomach cancer.  ROS:   Please see the history of present illness.     All other systems reviewed and are negative.   Labs/Other Tests and Data Reviewed:    Recent Labs: 07/24/2019: TSH 1.44 04/11/2020: ALT 10 05/15/2020: BUN 10; Creatinine, Ser 0.58; Hemoglobin 12.7; Platelets 253; Potassium 3.8; Sodium 137   Recent Lipid Panel Lab Results  Component Value Date/Time   CHOL 213 (H) 07/24/2019 09:46 AM   TRIG 68.0 07/24/2019 09:46 AM   HDL 53.90 07/24/2019 09:46 AM   CHOLHDL 4 07/24/2019 09:46 AM   LDLCALC 146 (H) 07/24/2019 09:46 AM   LDLDIRECT 178.9 03/23/2013 10:59 AM    Wt Readings from Last 3 Encounters:  06/19/20 125 lb 6.4 oz (56.9 kg)  06/11/20 125 lb (56.7 kg)  06/03/20 125 lb (56.7 kg)     Objective:    Vital Signs:  BP 100/74   Pulse 72   Ht 5\' 6"  (1.676 m)   Wt 125 lb 6.4 oz (56.9 kg)   LMP 03/02/1994 (Approximate)   SpO2 95%   BMI 20.24 kg/m     GEN: Well nourished, well developed in no acute distress HEENT: Normal NECK: No JVD; No carotid bruits LYMPHATICS: No lymphadenopathy CARDIAC:RRR, no murmurs, rubs, gallops RESPIRATORY:  Clear to auscultation without rales, wheezing or rhonchi  ABDOMEN: Soft, non-tender, non-distended MUSCULOSKELETAL:  No edema; No deformity  SKIN: Warm and dry.  Surgical incisions over right chest Resnick and neck are well healed NEUROLOGIC:  Alert and oriented x 3 PSYCHIATRIC:  Normal affect    ASSESSMENT & PLAN:    1.  OSA  -she has severe OSA intolerant to PAP therapy -she is now s/p Hypoglossal nerve stimulator implant by Dr. Redmond Baseman -her device was activated today with good waveforms and her functional threshold is 0.6V.  -Sensation threshold 0.5V  -Functional threshold 0.6V -programmed with the following parameters:  -Pulse width 59ms  -rate 33Hz   -Start delay 25min  -Pause time 5min  -Therapy duration 8hr  -Electrodes +-+  -Stimulation settings 0.6-1.6V -she will followup with me in 4 weeks for virtual check in and if doing well will set up in lab Inpsire titration 8 weeks after that  2.  PAF -s/p ablation -she is maintaining NSR on exam today and denies any palpitations -continue apixaban 5mg  BID and Lopressor 12.5mg  BID  Medication Adjustments/Labs and Tests Ordered: Current medicines are reviewed at length with the patient today.  Concerns regarding medicines are outlined above.  Tests Ordered: No orders of the defined types were placed in this encounter.  Medication Changes: No orders of the defined types were placed in this encounter.   Disposition:  Follow up in 4 weeks virtual visit  Signed, [name], MD  06/19/2020 9:41 AM    [place]

## 2020-06-19 ENCOUNTER — Ambulatory Visit: Payer: PPO | Admitting: Cardiology

## 2020-06-19 ENCOUNTER — Encounter: Payer: Self-pay | Admitting: Cardiology

## 2020-06-19 ENCOUNTER — Other Ambulatory Visit: Payer: Self-pay

## 2020-06-19 VITALS — BP 100/74 | HR 72 | Ht 66.0 in | Wt 125.4 lb

## 2020-06-19 DIAGNOSIS — I480 Paroxysmal atrial fibrillation: Secondary | ICD-10-CM

## 2020-06-19 DIAGNOSIS — G4733 Obstructive sleep apnea (adult) (pediatric): Secondary | ICD-10-CM | POA: Diagnosis not present

## 2020-06-19 NOTE — Patient Instructions (Signed)
Medication Instructions:  Your physician recommends that you continue on your current medications as directed. Please refer to the Current Medication list given to you today.  *If you need a refill on your cardiac medications before your next appointment, please call your pharmacy*   Lab Work: NONE TODAY If you have labs (blood work) drawn today and your tests are completely normal, you will receive your results only by: [name] MyChart Message (if you have MyChart) OR . A paper copy in the mail If you have any lab test that is abnormal or we need to change your treatment, we will call you to review the results.   Testing/Procedures: NONE    Follow-Up: At [facility], you and your health needs are our priority.  As part of our continuing mission to provide you with exceptional heart care, we have created designated Provider Care Teams.  These Care Teams include your primary Cardiologist (physician) and Advanced Practice Providers (APPs -  Physician Assistants and Nurse Practitioners) who all work together to provide you with the care you need, when you need it.  We recommend signing up for the patient portal called "MyChart".  Sign up information is provided on this After Visit Summary.  MyChart is used to connect with patients for Virtual Visits (Telemedicine).  Patients are able to view lab/test results, encounter notes, upcoming appointments, etc.  Non-urgent messages can be sent to your provider as well.   To learn more about what you can do with MyChart, go to [email or web address].    Your next appointment:   4 week(s)  The format for your next appointment:   Virtual Visit   Provider:   [name], MD   Other Instructions NONE

## 2020-07-09 ENCOUNTER — Other Ambulatory Visit: Payer: Self-pay | Admitting: Internal Medicine

## 2020-07-22 ENCOUNTER — Encounter: Payer: Self-pay | Admitting: Orthopaedic Surgery

## 2020-07-24 NOTE — Telephone Encounter (Signed)
Please get approval for gel injection

## 2020-07-25 ENCOUNTER — Other Ambulatory Visit: Payer: Self-pay

## 2020-07-25 ENCOUNTER — Telehealth: Payer: Self-pay | Admitting: *Deleted

## 2020-07-25 ENCOUNTER — Telehealth (INDEPENDENT_AMBULATORY_CARE_PROVIDER_SITE_OTHER): Payer: PPO | Admitting: Cardiology

## 2020-07-25 ENCOUNTER — Encounter: Payer: Self-pay | Admitting: Cardiology

## 2020-07-25 VITALS — Ht 66.0 in | Wt 123.0 lb

## 2020-07-25 DIAGNOSIS — I480 Paroxysmal atrial fibrillation: Secondary | ICD-10-CM

## 2020-07-25 DIAGNOSIS — G4733 Obstructive sleep apnea (adult) (pediatric): Secondary | ICD-10-CM | POA: Diagnosis not present

## 2020-07-25 NOTE — Progress Notes (Signed)
Virtual Visit via Video Note   This visit type was conducted due to national recommendations for restrictions regarding the COVID-19 Pandemic (e.g. social distancing) in an effort to limit this patient's exposure and mitigate transmission in our community.  Due to her co-morbid illnesses, this patient is at least at moderate risk for complications without adequate follow up.  This format is felt to be most appropriate for this patient at this time.  All issues noted in this document were discussed and addressed.  A limited physical exam was performed with this format.  Please refer to the patient's chart for her consent to telehealth for [facility].       Date:  07/25/2020   ID:  Brittany [name], DOB February 16, 1946, MRN [id number] The patient was identified using 2 identifiers.  Patient Location: Home Provider Location: Office/Clinic   PCP:  [name], MD   [facility] Providers Cardiologist:  Candee Furbish, MD Electrophysiologist:  Will Meredith Leeds, MD  Sleep Medicine:  [name], MD     Evaluation Performed:  Follow-Up Visit  Chief Complaint:  OSA  History of Present Illness:    Brittany [name] is a 75 y.o. female with who is followed by Dr. Marlou Porch for atrial fibrillation who was referred for sleep study.  She underwent home sleep study which showed severe obstructive sleep apnea with an AHI of 34.4/h and moderate oxygen desaturations to 81%.  She underwent CPAP titration to 12 cm H2O.   When I saw her last she was not doing well with her PAP device and I referred her to ENT for evaluation for the hypoglossal nerve stimulator.  She underwent Inspire device implantation 05/15/2020.  She was seen 4 weeks ago for activation of her Inspire device and now is here for her 4 weeks checkin.   She is doing well with her device and is up to level 8 (1.4V) and is using it every night.  She says that the delay on the device when she goes to bed is fine.  She feels rested in the am  and feels much better in the morning and is not snoring.  She does no wake up gasping for breath.  She gets up once to use the bathroom at night and has not had to use the pause on the device.  She has not had any problems with tongue discomfort.   The patient does not have symptoms concerning for COVID-19 infection (fever, chills, cough, or new shortness of breath).    Past Medical History:  Diagnosis Date  . A-fib ([place])   . Acid reflux   . Acute cystitis with positive culture 03/06/2014  . Anemia   . Anxiety   . Arthritis   . Baker cyst, left 04/06/2016   Aspirated 04/06/2016  . Breast cancer ([place]) 11/23/2007   right, ER/PR -, HER 2 -  . Cancer [facility])    breast right  triple recet neg, lumpect, chemo, XRT  . Cognitive deficits 09/25/2014   2016 Very mild, could be related to meds   . Degenerative arthritis of left knee 04/06/2016   Injection and aspiration 04/06/2016  . Degenerative arthritis of right knee 09/27/2015  . Depression   . Dysrhythmia    PAF  . Esophageal stricture   . ESOPHAGEAL STRICTURE 11/23/2007   Qualifier: Diagnosis of  By: [name], Dimas Millin Gastritis 10/2007  . History of chemotherapy    neoadjuvant chemo, last dose 03/2008  .  History of radiation therapy 07/25/08 -09/11/08   right breast  . Hx of colonic polyps 08/11/2016  . Hyperlipidemia   . Irregular heart beat 10/13/2017   2019  . Migraines    rare  . Myalgia 07/18/2013   5/15 multiple site   . OSA (obstructive sleep apnea)    severe OSA AHI 34, intolerant to PAP and now s/p hypoglossal nerve stimulator implant  . Osteopenia   . Other specified injury of right quadriceps muscle, fascia and tendon, initial encounter 09/14/2016  . Rupture of quadriceps tendon 03/23/2017   Past Surgical History:  Procedure Laterality Date  . ATRIAL FIBRILLATION ABLATION N/A 10/07/2018   Procedure: ATRIAL FIBRILLATION ABLATION;  Surgeon: [name], MD;  Location: [place] [facility];  Service: Cardiovascular;   Laterality: N/A;  . BREAST LUMPECTOMY  06/26/2008   right, CA   XRT, chemo  . COLONOSCOPY    . DRUG INDUCED ENDOSCOPY N/A 02/21/2020   Procedure: DRUG INDUCED ENDOSCOPY;  Surgeon: Melida Quitter, MD;  Location: [place];  Service: ENT;  Laterality: N/A;  . EYE SURGERY     left eye cataract surgery 12/23/2015  . growth removed per right thigh     as teen - mole  . IMPLANTATION OF HYPOGLOSSAL NERVE STIMULATOR N/A 05/15/2020   Procedure: IMPLANTATION OF HYPOGLOSSAL NERVE STIMULATOR;  Surgeon: Melida Quitter, MD;  Location: [place];  Service: ENT;  Laterality: N/A;  . JOINT REPLACEMENT  12/31/2015   RTK  . QUADRICEPS TENDON REPAIR Right 09/14/2016   Procedure: OPEN REPAIR QUADRICEP TENDON;  Surgeon: [name], MD;  [facility];  Service: Orthopedics;  Laterality: Right;  . TOTAL KNEE ARTHROPLASTY Right 12/31/2015   Procedure: RIGHT TOTAL KNEE ARTHROPLASTY;  Surgeon: [name], MD;  [facility];  Service: Orthopedics;  Laterality: Right;  . UPPER GASTROINTESTINAL ENDOSCOPY       Current Meds  Medication Sig  . Biotin 5000 MCG TABS Take 5,000 mcg by mouth daily.  [name] buPROPion (WELLBUTRIN XL) 150 MG 24 hr tablet Take 1 tablet (150 mg total) by mouth daily.  . Calcium-Magnesium-Zinc (CAL-MAG-ZINC PO) Take 1 tablet by mouth daily.  . Cholecalciferol (VITAMIN D) 50 MCG (2000 UT) tablet Take 2,000 Units by mouth daily.  . citalopram (CELEXA) 40 MG tablet Take 1 tablet (40 mg total) by mouth daily. Annual appt due in May must see provider for future refills  . CRANBERRY PO Take 400 mg by mouth daily.  [name] denosumab (PROLIA) 60 MG/ML SOSY injection Inject 60 mg into the skin every 6 (six) months.  . diphenhydrAMINE (BENADRYL) 25 MG tablet Take 25 mg by mouth every 6 (six) hours as needed for allergies.  [name] ELIQUIS 5 MG TABS tablet TAKE 1 TABLET BY MOUTH TWICE A DAY  . famotidine (PEPCID) 40 MG tablet Take 1 tablet (40 mg total) by mouth daily. Annual appt due in May must see  provider for future refills  . fluticasone (FLONASE) 50 MCG/ACT nasal spray Place 2 sprays into both nostrils daily.  . Omega-3 Fatty Acids (FISH OIL) 1200 MG CAPS Take 1,200 mg by mouth daily.  . Probiotic Product (PROBIOTIC DAILY PO) Take 1 tablet by mouth daily.  . SUMAtriptan (IMITREX) 100 MG tablet TAKE 1 TABLET BY MOUTH ONCE FOR 1 DOSE. MAY REPEAT IN 2 HOURS IF HEADACHE PERSISTS / RECURS  . terbinafine (LAMISIL) 250 MG tablet Take 250 mg by mouth daily.  [name] VALERIAN ROOT PO Take 1,200 mg by mouth at bedtime.  [name]  vitamin B-12 (CYANOCOBALAMIN) 1000 MCG tablet Take 1,000 mcg by mouth daily.  [name] zolpidem (AMBIEN) 10 MG tablet TAKE 0.5-1 TABLETS (5-10 MG TOTAL) BY MOUTH AT BEDTIME AS NEEDED. FOR SLEEP   Current Facility-Administered Medications for the 07/25/20 encounter (Video Visit) with [name], MD  Medication  . 0.9 %  sodium chloride infusion     Allergies:   Boniva [ibandronic acid] and Lipitor [atorvastatin]   Social History   Tobacco Use  . Smoking status: Former Smoker    Packs/day: 0.25    Years: 5.00    Pack years: 1.25    Types: Cigarettes    Quit date: 03/03/1967    Years since quitting: 53.4  . Smokeless tobacco: Never Used  . Tobacco comment: 6 cig per day when she was smoking/quit years ago  Vaping Use  . Vaping Use: Never used  Substance Use Topics  . Alcohol use: Yes    Alcohol/week: 7.0 standard drinks    Types: 7 Cans of beer per week    Comment: 1 beer per day   . Drug use: No     Family Hx: The patient's family history includes Breast cancer (age of onset: 68) in her sister; Cancer in her father; Colon cancer (age of onset: 42) in her sister; Dementia in her mother; Epilepsy in her mother; Heart disease in her father; Hip fracture in her mother; Other in her brother and maternal grandfather. There is no history of Esophageal cancer or Stomach cancer.  ROS:   Please see the history of present illness.    none All other systems reviewed and are  negative.   Prior CV studies:   The following studies were reviewed today:  none  Labs/Other Tests and Data Reviewed:    EKG:  No ECG reviewed.  Recent Labs: 04/11/2020: ALT 10 05/15/2020: BUN 10; Creatinine, Ser 0.58; Hemoglobin 12.7; Platelets 253; Potassium 3.8; Sodium 137   Recent Lipid Panel Lab Results  Component Value Date/Time   CHOL 213 (H) 07/24/2019 09:46 AM   TRIG 68.0 07/24/2019 09:46 AM   HDL 53.90 07/24/2019 09:46 AM   CHOLHDL 4 07/24/2019 09:46 AM   LDLCALC 146 (H) 07/24/2019 09:46 AM   LDLDIRECT 178.9 03/23/2013 10:59 AM    Wt Readings from Last 3 Encounters:  07/25/20 123 lb (55.8 kg)  06/19/20 125 lb 6.4 oz (56.9 kg)  06/11/20 125 lb (56.7 kg)     Risk Assessment/Calculations:      Objective:    Vital Signs:  Ht 5\' 6"  (1.676 m)   Wt 123 lb (55.8 kg)   LMP 03/02/1994 (Approximate)   BMI 19.85 kg/m    VITAL SIGNS:  reviewed GEN:  no acute distress EYES:  sclerae anicteric, EOMI - Extraocular Movements Intact RESPIRATORY:  normal respiratory effort, symmetric expansion CARDIOVASCULAR:  no peripheral edema SKIN:  no rash, lesions or ulcers. MUSCULOSKELETAL:  no obvious deformities. NEURO:  alert and oriented x 3, no obvious focal deficit PSYCH:  normal affect  ASSESSMENT & PLAN:    1.  OSA  -she has severe OSA intolerant to PAP therapy -she is now s/p Hypoglossal nerve stimulator implant by Dr. Redmond Baseman -her device was activated 4 weeks ago with the following programming:              -Sensation threshold 0.5V             -Functional threshold 0.6V -programmed with the following parameters:             -  Pulse width 38ms             -rate 33Hz              -Start delay 66min             -Pause time 21min             -Therapy duration 8hr             -Electrodes +-+             -Stimulation settings 0.6-1.6V -she is doing well after 4 weeks of using her device -she has had significant improvement in her daytime sleepiness and is not  snoring -she is now on level 8 which is 1.4V -I will set up her in lab Inspire titration with the Inspire rep to fine tune her device -she will see me back after her titration  2.  PAF -s/p ablation -she is maintaining NSR on exam today and denies any palpitations and denies any bleeding problems on the DOAC -Continue prescription drug management with apixaban 5mg  BID and Lopressor 12.5mg  BID with PRN refill -I have personally reviewed and interpreted outside labs performed by patient's PCP which showed SCr 0.58 and K+ 3.8 and Hbg 12.7 in March 2022   Time Spent: 20 minutes total time of encounter, including 10 minutes spent in face-to-face patient care on the date of this encounter. This time includes coordination of care and counseling regarding above mentioned problem list. Remainder of non-face-to-face time involved reviewing chart documents/testing relevant to the patient encounter and documentation in the medical record. I have independently reviewed documentation from referring provider  Medication Adjustments/Labs and Tests Ordered: Current medicines are reviewed at length with the patient today.  Concerns regarding medicines are outlined above.  No orders of the defined types were placed in this encounter.  No orders of the defined types were placed in this encounter.   COVID-19 Education: The signs and symptoms of COVID-19 were discussed with the patient and how to seek care for testing (follow up with PCP or arrange E-visit).  The importance of social distancing was discussed today.  Medication Adjustments/Labs and Tests Ordered: Current medicines are reviewed at length with the patient today.  Concerns regarding medicines are outlined above.   Tests Ordered: No orders of the defined types were placed in this encounter.   Medication Changes: No orders of the defined types were placed in this encounter.   Follow Up:  After Inspire titration  Signed, [name], MD   07/25/2020 9:59 AM    [facility]

## 2020-07-25 NOTE — Patient Instructions (Signed)
Medication Instructions:  Your physician recommends that you continue on your current medications as directed. Please refer to the Current Medication list given to you today.  *If you need a refill on your cardiac medications before your next appointment, please call your pharmacy*  Testing/Procedures: In-lab Inspire Titration    Follow-Up: At [facility], you and your health needs are our priority.  As part of our continuing mission to provide you with exceptional heart care, we have created designated Provider Care Teams.  These Care Teams include your primary Cardiologist (physician) and Advanced Practice Providers (APPs -  Physician Assistants and Nurse Practitioners) who all work together to provide you with the care you need, when you need it.  Follow up with Dr. Radford Pax after titration.

## 2020-07-25 NOTE — Telephone Encounter (Signed)
-----   Message from Antonieta Iba, RN sent at 07/25/2020 10:01 AM EDT ----- Please set up in lab inspire titration for this patient.  Thanks!

## 2020-07-25 NOTE — Telephone Encounter (Signed)
Inspire Titration sent to precert pool.

## 2020-07-26 ENCOUNTER — Telehealth: Payer: Self-pay

## 2020-07-26 ENCOUNTER — Telehealth: Payer: Self-pay | Admitting: *Deleted

## 2020-07-26 NOTE — Telephone Encounter (Signed)
This PA  Is done via paper form. Since I am on vacation next week I will defer this to Gae Bon to complete the form and send it in to [facility] with office notes.

## 2020-07-26 NOTE — Telephone Encounter (Signed)
-----   Message from Freada Bergeron, [place] sent at 07/25/2020 10:13 AM EDT ----- Regarding: precert in lab inspire titration  ----- Message ----- From: Antonieta Iba, RN Sent: 07/25/2020  10:02 AM EDT To: Freada Bergeron, CMA  Please set up in lab inspire titration for this patient.  Thanks!

## 2020-07-26 NOTE — Telephone Encounter (Signed)
VOB has been submitted for Monovisc, left knee. Pending BV. 

## 2020-07-30 ENCOUNTER — Telehealth: Payer: Self-pay

## 2020-07-30 NOTE — Telephone Encounter (Signed)
Approved for Monovisc, left knee. [place] Patient will be responsible for 20%OOP. Co-pay of $30.00 No PA required  Appt. 08/02/2020 with Dr. Erlinda Hong

## 2020-08-01 ENCOUNTER — Encounter: Payer: Self-pay | Admitting: Oncology

## 2020-08-02 ENCOUNTER — Ambulatory Visit: Payer: PPO | Admitting: Orthopaedic Surgery

## 2020-08-02 ENCOUNTER — Other Ambulatory Visit: Payer: Self-pay

## 2020-08-02 ENCOUNTER — Encounter: Payer: Self-pay | Admitting: Orthopaedic Surgery

## 2020-08-02 VITALS — Ht 66.0 in | Wt 123.0 lb

## 2020-08-02 DIAGNOSIS — M1712 Unilateral primary osteoarthritis, left knee: Secondary | ICD-10-CM | POA: Diagnosis not present

## 2020-08-02 MED ORDER — HYALURONAN 88 MG/4ML IX SOSY
88.0000 mg | PREFILLED_SYRINGE | INTRA_ARTICULAR | Status: AC | PRN
  Administered 2020-08-02: 88 mg via INTRA_ARTICULAR

## 2020-08-02 NOTE — Progress Notes (Signed)
Office Visit Note   Patient: Brittany [name]           Date of Birth: [birth year]           MRN: [id number] Visit Date: 08/02/2020              Requested by: [name], MD [place],  [place] [address] PCP: [name], MD   Assessment & Plan: Visit Diagnoses:  1. Primary osteoarthritis of left knee     Plan: Impression is advanced the left knee DJD with valgus deformity.  15 cc of joint fluid aspirated and then injected with Monovisc.  Patient tolerated this well.  She will let me know how well this injection works.  She had a Baker's cyst aspirated but it came back after couple days.  I do not recommend really aspirating the cyst.  She understands knee replacement is the definitive treatment for this  Follow-Up Instructions: No follow-ups on file.   Orders:  No orders of the defined types were placed in this encounter.  No orders of the defined types were placed in this encounter.     Procedures: Large Joint Inj: L knee on 08/02/2020 10:30 AM Indications: pain Details: 22 G needle  Arthrogram: No  Medications: 88 mg Hyaluronan 88 MG/4ML Outcome: tolerated well, no immediate complications Patient was prepped and draped in the usual sterile fashion.       Clinical Data: No additional findings.   Subjective: Chief Complaint  Patient presents with  . Left Knee - Follow-up    Monovisc    Brittany [name] comes in today for scheduled left knee Monovisc injection.   Review of Systems   Objective: Vital Signs: Ht 5\' 6"  (1.676 m)   Wt 123 lb (55.8 kg)   LMP 03/02/1994 (Approximate)   BMI 19.85 kg/m   Physical Exam  Ortho Exam Left knee shows small joint effusion.  Slight valgus and flexion contracture. Specialty Comments:  No specialty comments available.  Imaging: No results found.   PMFS History: Patient Active Problem List   Diagnosis Date Noted  . Atrial fibrillation ([place]) 05/13/2018  . Bloating 04/11/2018  . Abnormal  EKG 08/23/2017  . Malignant neoplasm of overlapping sites of right breast in female, estrogen receptor positive ([place]) 08/17/2016  . Hx of colonic polyps 08/11/2016  . Iron deficiency anemia 07/28/2016  . Blood donor 07/28/2016  . Pain due to total right knee replacement ([employer]) 04/06/2016  . 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 Baker's cyst, unruptured 09/27/2015  . Insomnia 07/31/2015  . Onychomycosis 09/25/2014  . Urinary frequency 05/21/2014  . Mitral valve prolapse 05/08/2014  . Mitral regurgitation 05/08/2014  . Dyslipidemia 02/09/2014  . History of radiation therapy   . Hiatal hernia   . History of chemotherapy   . Osteoporosis 03/21/2012  . Seasonal and perennial allergic rhinitis 12/17/2011  . B12 deficiency 10/09/2009  . TOBACCO USE, QUIT 01/31/2009  . THYROID NODULE 12/25/2008  . GERD 11/23/2007  . VARICOSE VEIN 12/29/2006  . Anxiety state 12/23/2006  . Depression 12/23/2006  . Migraine variant 12/23/2006  . Disorder of bone and cartilage 12/23/2006   Past Medical History:  Diagnosis Date  . A-fib ([place])   . Acid reflux   . Acute cystitis with positive culture 03/06/2014  . Anemia   . Anxiety   . Arthritis   . Baker cyst, left 04/06/2016   Aspirated 04/06/2016  . Breast cancer ([place]) 11/23/2007   right, ER/PR -, HER  2 -  . Cancer ([place])    breast right  triple recet neg, lumpect, chemo, XRT  . Cognitive deficits 09/25/2014   2016 Very mild, could be related to meds   . Degenerative arthritis of left knee 04/06/2016   Injection and aspiration 04/06/2016  . Degenerative arthritis of right knee 09/27/2015  . Depression   . Dysrhythmia    PAF  . Esophageal stricture   . ESOPHAGEAL STRICTURE 11/23/2007   Qualifier: Diagnosis of  By: [name], Dimas Millin Gastritis 10/2007  . History of chemotherapy    neoadjuvant chemo, last dose 03/2008  . History of radiation therapy 07/25/08 -09/11/08   right breast  . Hx of colonic polyps 08/11/2016  . Hyperlipidemia   . Irregular  heart beat 10/13/2017   2019  . Migraines    rare  . Myalgia 07/18/2013   5/15 multiple site   . OSA (obstructive sleep apnea)    severe OSA AHI 34, intolerant to PAP and now s/p hypoglossal nerve stimulator implant  . Osteopenia   . Other specified injury of right quadriceps muscle, fascia and tendon, initial encounter 09/14/2016  . Rupture of quadriceps tendon 03/23/2017    Family History  Problem Relation Age of Onset  . Heart disease Father   . Cancer Father        lung-nonsmoker  . Breast cancer Sister 72  . Colon cancer Sister 30  . Epilepsy Mother        at older age  . Dementia Mother        ?uncertain type  . Hip fracture Mother        x2  . Other Brother        suicide  . Other Maternal Grandfather        didn't go to doctor  . Esophageal cancer Neg Hx   . Stomach cancer Neg Hx     Past Surgical History:  Procedure Laterality Date  . ATRIAL FIBRILLATION ABLATION N/A 10/07/2018   Procedure: ATRIAL FIBRILLATION ABLATION;  Surgeon: [name], MD;  Location: [facility];  Service: Cardiovascular;  Laterality: N/A;  . BREAST LUMPECTOMY  06/26/2008   right, CA   XRT, chemo  . COLONOSCOPY    . DRUG INDUCED ENDOSCOPY N/A 02/21/2020   Procedure: DRUG INDUCED ENDOSCOPY;  Surgeon: Melida Quitter, MD;  Location: [facility];  Service: ENT;  Laterality: N/A;  . EYE SURGERY     left eye cataract surgery 12/23/2015  . growth removed per right thigh     as teen - mole  . IMPLANTATION OF HYPOGLOSSAL NERVE STIMULATOR N/A 05/15/2020   Procedure: IMPLANTATION OF HYPOGLOSSAL NERVE STIMULATOR;  Surgeon: Melida Quitter, MD;  Location: [facility];  Service: ENT;  Laterality: N/A;  . JOINT REPLACEMENT  12/31/2015   RTK  . QUADRICEPS TENDON REPAIR Right 09/14/2016   Procedure: OPEN REPAIR QUADRICEP TENDON;  Surgeon: [name], MD;  [facility];  Service: Orthopedics;  Laterality: Right;  . TOTAL KNEE ARTHROPLASTY Right 12/31/2015   Procedure: RIGHT TOTAL  KNEE ARTHROPLASTY;  Surgeon: [name], MD;  Location[facility];  Service: Orthopedics;  Laterality: Right;  . UPPER GASTROINTESTINAL ENDOSCOPY     Social History   Occupational History    Employer: RETIRED  Tobacco Use  . Smoking status: Former Smoker    Packs/day: 0.25    Years: 5.00    Pack years: 1.25    Types: Cigarettes  Quit date: 03/03/1967    Years since quitting: 53.4  . Smokeless tobacco: Never Used  . Tobacco comment: 6 cig per day when she was smoking/quit years ago  Vaping Use  . Vaping Use: Never used  Substance and Sexual Activity  . Alcohol use: Yes    Alcohol/week: 7.0 standard drinks    Types: 7 Cans of beer per week    Comment: 1 beer per day   . Drug use: No  . Sexual activity: Yes    Partners: Male    Birth control/protection: Post-menopausal

## 2020-08-09 NOTE — Telephone Encounter (Signed)
Prior Authorization for titration sent to [facility] via Fax 08/09/20.

## 2020-08-12 ENCOUNTER — Other Ambulatory Visit: Payer: Self-pay | Admitting: Internal Medicine

## 2020-08-13 ENCOUNTER — Telehealth: Payer: Self-pay | Admitting: Family Medicine

## 2020-08-13 MED ORDER — CITALOPRAM HYDROBROMIDE 40 MG PO TABS: 40.0000 mg | ORAL_TABLET | Freq: Every day | ORAL | 0 refills | Status: DC

## 2020-08-13 NOTE — Telephone Encounter (Signed)
Pt call and need a refill on citalopram (CELEXA) 40 MG tablet [[id number] [facility] [id number], [place] Phone:  [phone]  Fax:  [phone]

## 2020-08-13 NOTE — Telephone Encounter (Signed)
Rx done. 

## 2020-08-14 NOTE — Telephone Encounter (Signed)
Due for physical exam. Please set up.

## 2020-08-14 NOTE — Telephone Encounter (Signed)
Prior Authorization for titration sent to [facility] via Fax 08/09/20

## 2020-08-15 NOTE — Telephone Encounter (Signed)
Left a detailed message at the pts home number to schedule an appt as below.

## 2020-08-19 NOTE — Telephone Encounter (Signed)
Ok to schedule titration valid dates are [id number] aut #[id number]   Patient is scheduled for lab study on 10-17-20 Patient understands her sleep study will be done at [facility] sleep lab. Patient understands she will receive a sleep packet in a week or so. Patient understands to call if she does not receive the sleep packet in a timely manner.  Left detailed message on voicemail with date and time of titration and informed patient to call back to confirm or reschedule.

## 2020-08-20 ENCOUNTER — Telehealth: Payer: Self-pay

## 2020-08-20 MED ORDER — TRAMADOL HCL 50 MG PO TABS: 50.0000 mg | ORAL_TABLET | Freq: Every day | ORAL | 0 refills | Status: DC | PRN

## 2020-08-20 NOTE — Telephone Encounter (Signed)
Patient called regarding her left knee she is requesting a call back:[phone]

## 2020-08-20 NOTE — Addendum Note (Signed)
Addended by: Azucena Cecil on: 08/20/2020 03:28 PM   Modules accepted: Orders

## 2020-08-20 NOTE — Telephone Encounter (Signed)
Patient would like to have SU in [facility] Aug 2022.   Takes Eliquis.  Would like to know if there is anything she can take in the mean time. States Tylenol is not helping   CB [phone]

## 2020-08-20 NOTE — Telephone Encounter (Signed)
I sent tramadol

## 2020-08-21 NOTE — Telephone Encounter (Signed)
Patient aware.

## 2020-09-02 DIAGNOSIS — M25512 Pain in left shoulder: Secondary | ICD-10-CM | POA: Diagnosis not present

## 2020-09-02 DIAGNOSIS — W101XXA Fall (on)(from) sidewalk curb, initial encounter: Secondary | ICD-10-CM | POA: Diagnosis not present

## 2020-09-02 DIAGNOSIS — S4992XA Unspecified injury of left shoulder and upper arm, initial encounter: Secondary | ICD-10-CM | POA: Diagnosis not present

## 2020-09-03 ENCOUNTER — Telehealth: Payer: Self-pay | Admitting: Family Medicine

## 2020-09-03 NOTE — Telephone Encounter (Signed)
famotidine (PEPCID) 40 MG tablet   [facility] [address], [place] Phone:  [phone]  Fax:  [phone]     Patient has a physical scheudled for 10/07/2020

## 2020-09-04 ENCOUNTER — Other Ambulatory Visit: Payer: Self-pay | Admitting: Family Medicine

## 2020-09-04 MED ORDER — FAMOTIDINE 40 MG PO TABS: 40.0000 mg | ORAL_TABLET | Freq: Every day | ORAL | 0 refills | Status: DC

## 2020-09-04 NOTE — Telephone Encounter (Signed)
Refill sent. If she is able to control sx with half tab (20mg ) that is preferred; but I sent the 40mg .

## 2020-09-04 NOTE — Telephone Encounter (Signed)
Patient informed of the message below.

## 2020-09-11 ENCOUNTER — Encounter: Payer: Self-pay | Admitting: Orthopaedic Surgery

## 2020-09-13 ENCOUNTER — Other Ambulatory Visit: Payer: Self-pay | Admitting: Family Medicine

## 2020-09-23 ENCOUNTER — Telehealth: Payer: Self-pay | Admitting: *Deleted

## 2020-09-23 NOTE — Telephone Encounter (Signed)
   [facility] Pre-operative Risk Assessment    Patient Name: Brittany [name]  DOB: 1945/09/10 MRN: [id number]  HEARTCARE STAFF:  - IMPORTANT!!!!!! Under Visit Info/Reason for Call, type in Other and utilize the format Clearance MM/DD/YY or Clearance TBD. Do not use dashes or single digits. - Please review there is not already an duplicate clearance open for this procedure. - If request is for dental extraction, please clarify the # of teeth to be extracted. - If the patient is currently at the dentist's office, call Pre-Op Callback Staff (MA/nurse) to input urgent request.  - If the patient is not currently in the dentist office, please route to the Pre-Op pool.  Request for surgical clearance:  What type of surgery is being performed? LEFT TOTAL KNEE  When is this surgery scheduled? 10/02/20  What type of clearance is required (medical clearance vs. Pharmacy clearance to hold med vs. Both)? BOTH  Are there any medications that need to be held prior to surgery and how long? ELIQUIS x 3 DAYS PRIOR  Practice name and name of physician performing surgery? [facility]; DR. Sparks  What is the office phone number? [phone]   7.   What is the office fax number? [place].   Anesthesia type (None, local, MAC, general) ? SPINAL   Julaine Hua 09/23/2020, 1:44 PM  _________________________________________________________________   (provider comments below)

## 2020-09-24 ENCOUNTER — Other Ambulatory Visit: Payer: Self-pay

## 2020-09-24 ENCOUNTER — Telehealth: Payer: Self-pay | Admitting: General Practice

## 2020-09-24 MED ORDER — APIXABAN 5 MG PO TABS: 5.0000 mg | ORAL_TABLET | Freq: Two times a day (BID) | ORAL | 5 refills | Status: DC

## 2020-09-24 NOTE — Telephone Encounter (Signed)
Please return call to patient's mobile line at [phone].

## 2020-09-24 NOTE — Telephone Encounter (Addendum)
Left message to call back x2.  Patient needs call back.

## 2020-09-24 NOTE — Telephone Encounter (Signed)
Patient unavailable.  Left message to call back.  Patient needs call back.

## 2020-09-24 NOTE — Telephone Encounter (Signed)
Patient with diagnosis of Afib on Eliquis for anticoagulation.    Procedure: left total knee replacement Date of procedure: 10/02/20   CHA2DS2-VASc Score = 2  This indicates a 2.2% annual risk of stroke. The patient's score is based upon: CHF History: No HTN History: No Diabetes History: No Stroke History: No Vascular Disease History: No Age Score: 1 Gender Score: 1    CrCl 76 mL/min Platelet count 253K  Per office protocol, patient can hold Eliquis for 3 days prior to procedure.    Patient should restart Eliquis on the evening of procedure or day after, at discretion of procedure MD

## 2020-09-24 NOTE — Telephone Encounter (Signed)
Patient is calling to talk with Coletta Memos about medical clearance.

## 2020-09-24 NOTE — Telephone Encounter (Signed)
Patient is returning call.  °

## 2020-09-25 ENCOUNTER — Ambulatory Visit (INDEPENDENT_AMBULATORY_CARE_PROVIDER_SITE_OTHER): Payer: PPO | Admitting: Family Medicine

## 2020-09-25 ENCOUNTER — Encounter: Payer: Self-pay | Admitting: Family Medicine

## 2020-09-25 VITALS — BP 102/70 | HR 59 | Temp 98.3°F | Ht 66.0 in | Wt 125.7 lb

## 2020-09-25 DIAGNOSIS — Z01818 Encounter for other preprocedural examination: Secondary | ICD-10-CM

## 2020-09-25 DIAGNOSIS — I480 Paroxysmal atrial fibrillation: Secondary | ICD-10-CM

## 2020-09-25 DIAGNOSIS — E785 Hyperlipidemia, unspecified: Secondary | ICD-10-CM

## 2020-09-25 LAB — COMPREHENSIVE METABOLIC PANEL
ALT: 11 U/L (ref 0–35)
AST: 15 U/L (ref 0–37)
Albumin: 4.3 g/dL (ref 3.5–5.2)
Alkaline Phosphatase: 55 U/L (ref 39–117)
BUN: 10 mg/dL (ref 6–23)
CO2: 31 mEq/L (ref 19–32)
Calcium: 9.6 mg/dL (ref 8.4–10.5)
Chloride: 101 mEq/L (ref 96–112)
Creatinine, Ser: 0.61 mg/dL (ref 0.40–1.20)
GFR: 88.91 mL/min (ref >60.00)
Glucose, Bld: 89 mg/dL (ref 70–99)
Potassium: 4.1 mEq/L (ref 3.5–5.1)
Sodium: 137 mEq/L (ref 135–145)
Total Bilirubin: 0.6 mg/dL (ref 0.2–1.2)
Total Protein: 7 g/dL (ref 6.0–8.3)

## 2020-09-25 LAB — CBC WITH DIFFERENTIAL/PLATELET
Basophils Absolute: 0 10*3/uL (ref 0.0–0.1)
Basophils Relative: 1 % (ref 0.0–3.0)
Eosinophils Absolute: 0.1 10*3/uL (ref 0.0–0.7)
Eosinophils Relative: 3.3 % (ref 0.0–5.0)
HCT: 39.7 % (ref 36.0–46.0)
Hemoglobin: 13.3 g/dL (ref 12.0–15.0)
Lymphocytes Relative: 20.4 % (ref 12.0–46.0)
Lymphs Abs: 0.9 10*3/uL (ref 0.7–4.0)
MCHC: 33.6 g/dL (ref 30.0–36.0)
MCV: 95.2 fl (ref 78.0–100.0)
Monocytes Absolute: 0.3 10*3/uL (ref 0.1–1.0)
Monocytes Relative: 7.8 % (ref 3.0–12.0)
Neutro Abs: 2.9 10*3/uL (ref 1.4–7.7)
Neutrophils Relative %: 67.5 % (ref 43.0–77.0)
Platelets: 282 10*3/uL (ref 150.0–400.0)
RBC: 4.18 Mil/uL (ref 3.87–5.11)
RDW: 13.2 % (ref 11.5–15.5)
WBC: 4.3 10*3/uL (ref 4.0–10.5)

## 2020-09-25 LAB — LIPID PANEL
Cholesterol: 232 mg/dL — ABNORMAL HIGH (ref 0–200)
HDL: 64.5 mg/dL (ref >39.00)
LDL Cholesterol: 150 mg/dL — ABNORMAL HIGH (ref 0–99)
NonHDL: 167.56
Total CHOL/HDL Ratio: 4
Triglycerides: 86 mg/dL (ref 0.0–149.0)
VLDL: 17.2 mg/dL (ref 0.0–40.0)

## 2020-09-25 LAB — PROTIME-INR
INR: 1 ratio (ref 0.8–1.0)
Prothrombin Time: 11.5 s (ref 9.6–13.1)

## 2020-09-25 LAB — APTT: aPTT: 38.3 s — ABNORMAL HIGH (ref 23.4–32.7)

## 2020-09-25 NOTE — Progress Notes (Signed)
Brittany [name] Date of Birth:  November 21, 1945  This patient presents to the office today for a preoperative consultation at the request of surgeon, Dr. Erlinda Hong, who plans on performing left total knee arthroplasty on TBD .    Brittany [name] has clearance from cardiology documented in phone call yesterday to hold eliquis for 3 days prior to procedure and to restart eliquis evening of procedure or day after. (Brittany [name] actually has stopped taking this a couple of days ago just in prep for upcoming surgery)  Planned anesthesia: spinal  Known anesthesia problems: none  Bleeding risk: no issues with bleeding with prior surgeries; does bleed easily on eliquis. Personal or FH of DVT/PE: no     Patient Active Problem List   Diagnosis Date Noted   Atrial fibrillation ([place]) 05/13/2018   Bloating 04/11/2018   Abnormal EKG 08/23/2017   Malignant neoplasm of overlapping sites of right breast in female, estrogen receptor positive ([place]) 08/17/2016   Hx of colonic polyps 08/11/2016   Iron deficiency anemia 07/28/2016   Blood donor 07/28/2016   Pain due to total right knee replacement ([place]) 04/06/2016   S/P right TKA 12/31/2015   Baker's cyst, unruptured 09/27/2015   Insomnia 07/31/2015   Onychomycosis 09/25/2014   Urinary frequency 05/21/2014   Mitral valve prolapse 05/08/2014   Mitral regurgitation 05/08/2014   Dyslipidemia 02/09/2014   History of radiation therapy    Hiatal hernia    History of chemotherapy    Osteoporosis 03/21/2012   Seasonal and perennial allergic rhinitis 12/17/2011   B12 deficiency 10/09/2009   TOBACCO USE, QUIT 01/31/2009   THYROID NODULE 12/25/2008   GERD 11/23/2007   VARICOSE VEIN 12/29/2006   Anxiety state 12/23/2006   Depression 12/23/2006   Migraine variant 12/23/2006   Disorder of bone and cartilage 12/23/2006   Past Surgical History:  Procedure Laterality Date   ATRIAL FIBRILLATION ABLATION N/A 10/07/2018   Procedure: ATRIAL FIBRILLATION ABLATION;  Surgeon: [name], MD;   Location: [facility];  Service: Cardiovascular;  Laterality: N/A;   BREAST LUMPECTOMY  06/26/2008   right, CA   XRT, chemo   COLONOSCOPY     DRUG INDUCED ENDOSCOPY N/A 02/21/2020   Procedure: DRUG INDUCED ENDOSCOPY;  Surgeon: Melida Quitter, MD;  Location: [facility];  Service: ENT;  Laterality: N/A;   EYE SURGERY     left eye cataract surgery 12/23/2015   growth removed per right thigh     as teen - mole   IMPLANTATION OF HYPOGLOSSAL NERVE STIMULATOR N/A 05/15/2020   Procedure: IMPLANTATION OF HYPOGLOSSAL NERVE STIMULATOR;  Surgeon: Melida Quitter, MD;  Location: [facility];  Service: ENT;  Laterality: N/A;   JOINT REPLACEMENT  12/31/2015   RTK   QUADRICEPS TENDON REPAIR Right 09/14/2016   Procedure: OPEN REPAIR QUADRICEP TENDON;  Surgeon: [name], MD;  [facility];  Service: Orthopedics;  Laterality: Right;   TOTAL KNEE ARTHROPLASTY Right 12/31/2015   Procedure: RIGHT TOTAL KNEE ARTHROPLASTY;  Surgeon: [name], MD;  [facility];  Service: Orthopedics;  Laterality: Right;   UPPER GASTROINTESTINAL ENDOSCOPY      Allergies  Allergen Reactions   Boniva [Ibandronic Acid] Nausea Only   Lipitor [Atorvastatin] Other (See Comments)    Muscle and joint pain(s)   Pt request removal of this intolerance   Current Meds  Medication Sig   apixaban (ELIQUIS) 5 MG TABS tablet Take 1 tablet (5 mg total) by mouth 2 (two) times daily.   Biotin 5000 MCG  TABS Take 5,000 mcg by mouth daily.   buPROPion (WELLBUTRIN XL) 150 MG 24 hr tablet Take 1 tablet (150 mg total) by mouth daily.   Calcium-Magnesium-Zinc (CAL-MAG-ZINC PO) Take 1 tablet by mouth daily.   Cholecalciferol (VITAMIN D) 50 MCG (2000 UT) tablet Take 2,000 Units by mouth daily.   citalopram (CELEXA) 40 MG tablet TAKE 1 TABLET (40 MG TOTAL) BY MOUTH DAILY. ANNUAL APPT DUE IN MAY MUST SEE PROVIDER FOR FUTURE REFILLS (Patient taking differently: Take 40 mg by mouth daily.)   denosumab (PROLIA) 60 MG/ML SOSY  injection Inject 60 mg into the skin every 6 (six) months.   diphenhydrAMINE (BENADRYL) 25 MG tablet Take 25 mg by mouth every 6 (six) hours as needed for allergies.   famotidine (PEPCID) 40 MG tablet TAKE 1 TABLET BY MOUTH EVERY DAY (Patient taking differently: Take 40 mg by mouth daily.)   fluticasone (FLONASE) 50 MCG/ACT nasal spray Place 2 sprays into both nostrils daily.   Omega-3 Fatty Acids (FISH OIL) 1200 MG CAPS Take 1,200 mg by mouth daily.   Probiotic Product (PROBIOTIC DAILY PO) Take 1 tablet by mouth daily.   SUMAtriptan (IMITREX) 100 MG tablet TAKE 1 TABLET BY MOUTH ONCE FOR 1 DOSE. MAY REPEAT IN 2 HOURS IF HEADACHE PERSISTS / RECURS (Patient taking differently: Take 100 mg by mouth every 2 (two) hours as needed for migraine.)   VALERIAN ROOT PO Take 1,200 mg by mouth at bedtime as needed (sleep).   vitamin B-12 (CYANOCOBALAMIN) 1000 MCG tablet Take 1,000 mcg by mouth daily.   zolpidem (AMBIEN) 10 MG tablet TAKE 0.5-1 TABLETS (5-10 MG TOTAL) BY MOUTH AT BEDTIME AS NEEDED. FOR SLEEP (Patient taking differently: Take 5 mg by mouth at bedtime as needed for sleep.)   Current Facility-Administered Medications for the 09/25/20 encounter (Office Visit) with [name], MD  Medication   0.9 %  sodium chloride infusion    Social History   Tobacco Use   Smoking status: Former    Packs/day: 0.25    Years: 5.00    Pack years: 1.25    Types: Cigarettes    Quit date: 03/03/1967    Years since quitting: 53.6   Smokeless tobacco: Never   Tobacco comments:    6 cig per day when Brittany [name] was smoking/quit years ago  Substance Use Topics   Alcohol use: Yes    Alcohol/week: 7.0 standard drinks    Types: 7 Cans of beer per week    Comment: 1 beer per day    Family History  Problem Relation Age of Onset   Heart disease Father    Cancer Father        lung-nonsmoker   Breast cancer Sister 83   Colon cancer Sister 11   Epilepsy Mother        at older age   Dementia Mother         ?uncertain type   Hip fracture Mother        x2   Other Brother        suicide   Other Maternal Grandfather        didn't go to doctor   Esophageal cancer Neg Hx    Stomach cancer Neg Hx     Review of Systems Asymptomatic   Recent Labs: CBC:  Lab Results  Component Value Date   WBC 4.5 05/15/2020   HGB 12.7 05/15/2020   HGB 12.9 04/11/2020   HGB 11.4 (L) 02/16/2017   HCT 39.7 05/15/2020  HCT 34.7 (L) 02/16/2017   MCH 31.4 05/15/2020   MCHC 32.0 05/15/2020   RDW 12.7 05/15/2020   RDW 13.0 02/16/2017   PLT 253 05/15/2020   PLT 224 04/11/2020   PLT 250 02/16/2017   CMP:  Lab Results  Component Value Date   NA 137 05/15/2020   NA 136 02/16/2017   K 3.8 05/15/2020   K 4.1 02/16/2017   CL 104 05/15/2020   CL 98 03/21/2012   CO2 26 05/15/2020   CO2 26 02/16/2017   ANIONGAP 7 05/15/2020   GLUCOSE 85 05/15/2020   GLUCOSE 83 02/16/2017   GLUCOSE 84 03/21/2012   BUN 10 05/15/2020   BUN 10.4 02/16/2017   CREATININE 0.58 05/15/2020   CREATININE 0.73 04/11/2020   CREATININE 0.7 02/16/2017   GFRAA >60 09/19/2019   CALCIUM 8.7 (L) 05/15/2020   CALCIUM 9.2 02/16/2017   PROT 7.1 04/11/2020   PROT 6.7 02/16/2017   BILITOT 0.4 04/11/2020   BILITOT 0.34 02/16/2017   ALKPHOS 53 04/11/2020   ALKPHOS 56 02/16/2017   ALT 10 04/11/2020   ALT 7 02/16/2017   AST 16 04/11/2020   AST 17 02/16/2017    HBA1C: No results found for: LABA1C, EAG  Objective:   BP 102/70 (BP Location: Left Arm, Patient Position: Sitting, Cuff Size: Normal)   Pulse (!) 59   Temp 98.3 F (36.8 C) (Oral)   Ht '5\' 6"'$  (1.676 m)   Wt 125 lb 11.2 oz (57 kg)   LMP 03/02/1994 (Approximate)   SpO2 97%   BMI 20.29 kg/m  Weight: 125 lb 11.2 oz (57 kg)  Physical Exam Constitutional:      General: Brittany [name] is not in acute distress.    Appearance: Brittany [name] is well-developed.  HENT:     Head: Normocephalic and atraumatic.     Right Ear: External ear normal.     Left Ear: External ear normal.      Mouth/Throat:     Pharynx: No oropharyngeal exudate.  Eyes:     Conjunctiva/sclera: Conjunctivae normal.     Pupils: Pupils are equal, round, and reactive to light.  Neck:     Thyroid: No thyromegaly.  Cardiovascular:     Rate and Rhythm: Normal rate and regular rhythm.     Heart sounds: Normal heart sounds. No murmur heard.   No friction rub. No gallop.  Pulmonary:     Effort: Pulmonary effort is normal.     Breath sounds: Normal breath sounds.  Abdominal:     General: Bowel sounds are normal. There is no distension.     Palpations: Abdomen is soft. There is no mass.     Tenderness: There is no abdominal tenderness. There is no guarding.     Hernia: No hernia is present.  Musculoskeletal:        General: No tenderness or deformity. Normal range of motion.     Cervical back: Normal range of motion and neck supple.     Comments: Left knee is generally swollen; large bakers cyst; loss of medial joint space  Lymphadenopathy:     Cervical: No cervical adenopathy.  Skin:    General: Skin is warm and dry.     Findings: No rash.  Neurological:     Mental Status: Brittany [name] is alert and oriented to person, place, and time.     Deep Tendon Reflexes: Reflexes normal.     Reflex Scores:      Tricep reflexes are 2+ on the right side and  2+ on the left side.      Bicep reflexes are 2+ on the right side and 2+ on the left side.      Brachioradialis reflexes are 2+ on the right side and 2+ on the left side.      Patellar reflexes are 2+ on the right side and 2+ on the left side. Psychiatric:        Speech: Speech normal.        Behavior: Behavior normal.        Thought Content: Thought content normal.     EKG Interpretation:  unchanged from previous tracings, short PR with PAC, rate controlled. No acute changes.  Lab Review: will review once available.     Assessment:   Brittany [name] was seen today for pre-op exam.  Diagnoses and all orders for this visit:  Dyslipidemia -     CMP -      Lipid panel; Future -     Lipid panel  Paroxysmal atrial fibrillation (HCC)  Preoperative evaluation to rule out surgical contraindication -     EKG 12-Lead -     Urinalysis with Culture Reflex -     CBC with Differential/Platelet; Future -     Cancel: Protime-INR -     Cancel: APTT -     Protime-INR; Future -     APTT; Future -     APTT -     Protime-INR -     CBC with Differential/Platelet  75 y.o.patient  approved for Surgery     Plan:   1. Preoperative workup as follows: will get bloodwork today (Brittany [name] is due anyway for lipid recheck; I will add on preop lab since Brittany [name] does not have presurgical lab visit set up) 2. Change in medication regimen before surgery: Brittany [name] has already held the eliquis. May be advisable to restart post operatively until Brittany [name] is back to normal activity level. Can discuss with ortho/cardio. 3. No contraindications to planned surgery  Note electronically signed by provider.  [name], MD

## 2020-09-25 NOTE — Patient Instructions (Addendum)
Call Dr. Phoebe Sharps office regarding need for bloodwork prior to your surgery. I have ordered all the labwork today except for the "type and screen" since I cannot do that in my office. I will fax all results and your most recent EKG to him once I have those.

## 2020-09-26 ENCOUNTER — Encounter: Payer: Self-pay | Admitting: Orthopaedic Surgery

## 2020-09-26 LAB — URINALYSIS W MICROSCOPIC + REFLEX CULTURE
Bacteria, UA: NONE SEEN /HPF
Bilirubin Urine: NEGATIVE
Glucose, UA: NEGATIVE
Hgb urine dipstick: NEGATIVE
Hyaline Cast: NONE SEEN /LPF
Ketones, ur: NEGATIVE
Leukocyte Esterase: NEGATIVE
Nitrites, Initial: NEGATIVE
Protein, ur: NEGATIVE
RBC / HPF: NONE SEEN /HPF (ref 0–2)
Specific Gravity, Urine: 1.017 (ref 1.001–1.035)
Squamous Epithelial / HPF: NONE SEEN /HPF (ref <=5)
WBC, UA: NONE SEEN /HPF (ref 0–5)
pH: 7.5 (ref 5.0–8.0)

## 2020-09-26 LAB — NO CULTURE INDICATED

## 2020-09-26 NOTE — Telephone Encounter (Signed)
Left voice message [phone] 09/26/2020.  Patient needs call back.

## 2020-09-27 NOTE — Progress Notes (Signed)
Contacted Dr. Phoebe Sharps scheduler about diet orders for both NPO and ERAS being in pt's order set for upcoming surgery. Per scheduler, use ERAS orders.

## 2020-09-27 NOTE — Pre-Procedure Instructions (Signed)
Brittany [name]  09/27/2020      Your procedure is scheduled on Wednesday, August 3.   Report to [facility], Main Entrance or Entrance "A" at 10:00 AM .                Your surgery or procedure is scheduled to begin at 12:00 PM   Call this number if you have problems the morning of surgery: [phone]  This is the number for the Pre- Surgical Desk.                For any other questions, please call [phone], Monday - Friday 8 AM - 4 PM.   Remember:  Do not eat midnight, Tuesday, August 2.  You may drink clear liquids until 9:00 AM .   Clear liquids allowed are:                     Water, Juice (non-citric and without pulp - diabetics please choose diet or no sugar options), Carbonated beverages - (diabetics please choose diet or no sugar options), Clear Tea, Black Coffee only (no creamer, milk or cream including half and half), Plain Jell-O only (diabetics please choose diet or no sugar options), Gatorade (diabetics please choose diet or no sugar options), and Plain Popsicles only   Enhanced Recovery after Surgery for Orthopedics Enhanced Recovery after Surgery is a protocol used to improve the stress on your body and your recovery after surgery.  Patient Instructions  The day of surgery (if you do NOT have diabetes):  Drink ONE (1) Pre-Surgery Clear Ensure by _10:00 AM_ am the morning of surgery   This drink was given to you during your hospital  pre-op appointment visit. Nothing else to drink after completing the  Pre-Surgery Clear Ensure.         If you have questions, please contact your surgeon's office.    Take these medicines the morning of surgery with A SIP OF WATER: buPROPion (WELLBUTRIN XL)  citalopram (CELEXA) famotidine (PEPCID)  fluticasone (FLONASE) nasal spray  Take if needed: diphenhydrAMINE (BENADRYL) SUMAtriptan (IMITREX)  STOP  Now taking Aspirin, Aspirin Products (Goody Powder, Excedrin Migraine), Ibuprofen (Advil), Naproxen (Aleve),  Vitamins and Herbal Products (ie Fish Oil).   Follow Dr. Phoebe Sharps instructions regarding apixaban Arne Cleveland)  Special instructions:    [place]- Preparing For Surgery  Before surgery, you can play an important role. Because skin is not sterile, your skin needs to be as free of germs as possible. You can reduce the number of germs on your skin by washing with CHG (chlorahexidine gluconate) Soap before surgery.  CHG is an antiseptic cleaner which kills germs and bonds with the skin to continue killing germs even after washing.    Oral Hygiene is also important to reduce your risk of infection.  Remember - BRUSH YOUR TEETH THE MORNING OF SURGERY WITH YOUR REGULAR TOOTHPASTE  Please do not use if you have an allergy to CHG or antibacterial soaps. If your skin becomes reddened/irritated stop using the CHG.  Do not shave (including legs and underarms) for at least 48 hours prior to first CHG shower. It is OK to shave your face.  Please follow these instructions carefully.   Shower the NIGHT BEFORE SURGERY and the MORNING OF SURGERY with CHG.   If you chose to wash your hair, wash your hair first as usual with your normal shampoo.  After you shampoo, wash your face and private area with  the soap you use at home, then rinse your hair and body thoroughly to remove the shampoo and soap.  Use CHG as you would any other liquid soap. You can apply CHG directly to the skin and wash gently with a scrungie or a clean washcloth.   Apply the CHG Soap to your body ONLY FROM THE NECK DOWN.  Do not use on open wounds or open sores. Avoid contact with your eyes, ears, mouth and genitals (private parts).   Wash thoroughly, paying special attention to the area where your surgery will be performed.  Thoroughly rinse your body with warm water from the neck down.  DO NOT shower/wash with your normal soap after using and rinsing off the CHG Soap.  Pat yourself dry with a CLEAN TOWEL.  Wear CLEAN PAJAMAS to bed  the night before surgery, wear comfortable clothes the morning of surgery  Place CLEAN SHEETS on your bed the night of your first shower and DO NOT SLEEP WITH PETS.  Day of Surgery: Shower as instructed above. Do not apply any deodorants/lotions, powders or colognes.  Please wear clean clothes to the hospital/surgery center.   Remember to brush your teeth WITH YOUR REGULAR TOOTHPASTE.  Do not wear jewelry, make-up or nail polish.  Do not shave 48 hours prior to surgery.  Men may shave face and neck.  Do not bring valuables to the hospital.  [facility] is not responsible for any belongings or valuables.  Contacts, dentures or bridgework may not be worn into surgery.  Leave your suitcase in the car.  After surgery it may be brought to your room.  For patients admitted to the hospital, discharge time will be determined by your treatment team.  Patients discharged the day of surgery will not be allowed to drive home.   Please read over the fact sheets that you were given.

## 2020-09-27 NOTE — Progress Notes (Addendum)
PCP - Dr. Ethlyn Gallery  Cardiologist - Dr.SKains  EP-no  Endocrine-no  Pulm-no  Chest x-ray - 05/09/20  EKG - 09/25/20  Stress Test - 10/23/19  ECHO - 12/29/17  Cardiac Cath - no  AICD-no PM-no LOOP-no  Dialysis-no  Sleep Study - 05/04/18- has a sleep study scheduled  at [facility] on 10/17/20 CPAP - no,  has an InSpire now.  LABS-CBC with Diff, CMP, Pt, PTT, UA, were drawn 09/25/20 at PCP's office. Mrs. Stallworth reported that Dr. Erlinda Hong sent her a MY  Chart Message saying that that she does not need a T/S.  PCR 09/30/20  ASA-na Eliquis - has been off since 09/18/20- Mrs. Bost reported that her Dr said that she can go off of it if she wants to. Has PAF, had an ablation. ERAS-yes  HA1C-na Fasting Blood Sugar - na Checks Blood Sugar __na___ times a day  Anesthesia-  Pt denies having chest pain, sob, or fever at this time. All instructions explained to the pt, with a verbal understanding of the material. Pt agrees to go over the instructions while at home for a better understanding. Pt also instructed to self quarantine after being tested for COVID-19. The opportunity to ask questions was provided.   I gave  Mrs. Woollard directions to Covid swab Center and asked her to go today and get tested.

## 2020-09-30 ENCOUNTER — Other Ambulatory Visit: Payer: Self-pay | Admitting: Orthopaedic Surgery

## 2020-09-30 ENCOUNTER — Encounter (HOSPITAL_COMMUNITY)
Admission: RE | Admit: 2020-09-30 | Discharge: 2020-09-30 | Disposition: A | Discharge status: Home / Self Care | Payer: PPO | Source: Ambulatory Visit | Attending: Orthopaedic Surgery | Admitting: Orthopaedic Surgery

## 2020-09-30 ENCOUNTER — Other Ambulatory Visit: Payer: Self-pay

## 2020-09-30 ENCOUNTER — Encounter (HOSPITAL_COMMUNITY): Payer: Self-pay

## 2020-09-30 DIAGNOSIS — Z01812 Encounter for preprocedural laboratory examination: Secondary | ICD-10-CM | POA: Insufficient documentation

## 2020-09-30 LAB — SURGICAL PCR SCREEN
MRSA, PCR: NEGATIVE
Staphylococcus aureus: NEGATIVE

## 2020-09-30 MED ORDER — CHLORHEXIDINE GLUCONATE CLOTH 2 % EX PADS: 6.0000 | MEDICATED_PAD | Freq: Once | CUTANEOUS | Status: DC

## 2020-10-01 ENCOUNTER — Telehealth: Payer: Self-pay | Admitting: *Deleted

## 2020-10-01 NOTE — Telephone Encounter (Signed)
Ortho bundle pre-op call completed. 

## 2020-10-01 NOTE — Care Plan (Signed)
RNCM call to patient to discuss her upcoming Left total knee replacement with Dr. Erlinda Hong. She is an Ortho bundle patient through [facility] and is agreeable to case management. She lives with her husband, who will be assisting her at home after discharge. Anticipate HHPT will be needed after short hospital stay. Referral to [facility] after choice provided. All DME ordered through [facility], which has already been delivered to her home (CPM, FWW). Reviewed all post op care instructions and will continue to follow for needs.

## 2020-10-02 ENCOUNTER — Other Ambulatory Visit: Payer: Self-pay | Admitting: Physician Assistant

## 2020-10-02 ENCOUNTER — Ambulatory Visit (HOSPITAL_COMMUNITY): Admission: RE | Admit: 2020-10-02 | Payer: PPO | Source: Home / Self Care | Admitting: Orthopaedic Surgery

## 2020-10-02 DIAGNOSIS — M1712 Unilateral primary osteoarthritis, left knee: Secondary | ICD-10-CM

## 2020-10-02 NOTE — Addendum Note (Signed)
Addended by: Freada Bergeron on: 10/02/2020 04:53 PM   Modules accepted: Orders

## 2020-10-02 NOTE — Telephone Encounter (Signed)
Prior Authorization for inspire titration sent to [facility] via Fax  [phone].

## 2020-10-02 NOTE — Telephone Encounter (Signed)
Patient had surgery today.

## 2020-10-06 ENCOUNTER — Other Ambulatory Visit: Payer: Self-pay | Admitting: Family Medicine

## 2020-10-07 ENCOUNTER — Encounter: Payer: PPO | Admitting: Family Medicine

## 2020-10-08 ENCOUNTER — Other Ambulatory Visit: Payer: Self-pay

## 2020-10-09 ENCOUNTER — Other Ambulatory Visit: Payer: PPO

## 2020-10-09 ENCOUNTER — Ambulatory Visit: Payer: PPO | Admitting: Oncology

## 2020-10-09 ENCOUNTER — Other Ambulatory Visit: Payer: Self-pay | Admitting: Physician Assistant

## 2020-10-09 ENCOUNTER — Ambulatory Visit: Payer: PPO

## 2020-10-09 LAB — SARS CORONAVIRUS 2 (TAT 6-24 HRS): SARS Coronavirus 2: NEGATIVE

## 2020-10-09 MED ORDER — ONDANSETRON HCL 4 MG PO TABS: 4.0000 mg | ORAL_TABLET | Freq: Three times a day (TID) | ORAL | 0 refills | Status: DC | PRN

## 2020-10-09 MED ORDER — OXYCODONE-ACETAMINOPHEN 5-325 MG PO TABS: 1.0000 | ORAL_TABLET | Freq: Four times a day (QID) | ORAL | 0 refills | Status: DC | PRN

## 2020-10-09 MED ORDER — METHOCARBAMOL 500 MG PO TABS: 500.0000 mg | ORAL_TABLET | Freq: Two times a day (BID) | ORAL | 0 refills | Status: DC | PRN

## 2020-10-09 MED ORDER — DOCUSATE SODIUM 100 MG PO CAPS: 100.0000 mg | ORAL_CAPSULE | Freq: Every day | ORAL | 2 refills | Status: DC | PRN

## 2020-10-10 ENCOUNTER — Other Ambulatory Visit: Payer: Self-pay

## 2020-10-10 ENCOUNTER — Telehealth: Payer: Self-pay | Admitting: *Deleted

## 2020-10-10 NOTE — Telephone Encounter (Signed)
Ortho bundle pre-op call completed. 

## 2020-10-11 ENCOUNTER — Inpatient Hospital Stay (HOSPITAL_COMMUNITY): Payer: PPO | Admitting: Anesthesiology

## 2020-10-11 ENCOUNTER — Other Ambulatory Visit: Payer: Self-pay

## 2020-10-11 ENCOUNTER — Inpatient Hospital Stay (HOSPITAL_COMMUNITY)
Admission: RE | Admit: 2020-10-11 | Discharge: 2020-10-12 | DRG: 470 | Disposition: A | Discharge status: Home Health | Payer: PPO | Attending: Orthopaedic Surgery | Admitting: Orthopaedic Surgery

## 2020-10-11 ENCOUNTER — Other Ambulatory Visit: Payer: Self-pay | Admitting: *Deleted

## 2020-10-11 ENCOUNTER — Inpatient Hospital Stay (HOSPITAL_COMMUNITY): Payer: PPO

## 2020-10-11 ENCOUNTER — Encounter (HOSPITAL_COMMUNITY)
Admission: RE | Disposition: A | Discharge status: Home Health | Payer: Self-pay | Source: Home / Self Care | Attending: Orthopaedic Surgery

## 2020-10-11 ENCOUNTER — Encounter (HOSPITAL_COMMUNITY): Payer: Self-pay | Admitting: Orthopaedic Surgery

## 2020-10-11 ENCOUNTER — Encounter (HOSPITAL_COMMUNITY): Admission: RE | Payer: Self-pay | Source: Home / Self Care

## 2020-10-11 ENCOUNTER — Encounter: Payer: PPO | Admitting: Orthopaedic Surgery

## 2020-10-11 DIAGNOSIS — Z01818 Encounter for other preprocedural examination: Secondary | ICD-10-CM | POA: Diagnosis not present

## 2020-10-11 DIAGNOSIS — M8580 Other specified disorders of bone density and structure, unspecified site: Secondary | ICD-10-CM | POA: Diagnosis not present

## 2020-10-11 DIAGNOSIS — Z96651 Presence of right artificial knee joint: Secondary | ICD-10-CM | POA: Diagnosis present

## 2020-10-11 DIAGNOSIS — Z7901 Long term (current) use of anticoagulants: Secondary | ICD-10-CM | POA: Diagnosis not present

## 2020-10-11 DIAGNOSIS — Z79899 Other long term (current) drug therapy: Secondary | ICD-10-CM

## 2020-10-11 DIAGNOSIS — Z885 Allergy status to narcotic agent status: Secondary | ICD-10-CM

## 2020-10-11 DIAGNOSIS — Z8249 Family history of ischemic heart disease and other diseases of the circulatory system: Secondary | ICD-10-CM

## 2020-10-11 DIAGNOSIS — E785 Hyperlipidemia, unspecified: Secondary | ICD-10-CM | POA: Diagnosis present

## 2020-10-11 DIAGNOSIS — F411 Generalized anxiety disorder: Secondary | ICD-10-CM | POA: Diagnosis not present

## 2020-10-11 DIAGNOSIS — Z96652 Presence of left artificial knee joint: Secondary | ICD-10-CM

## 2020-10-11 DIAGNOSIS — D62 Acute posthemorrhagic anemia: Secondary | ICD-10-CM | POA: Diagnosis not present

## 2020-10-11 DIAGNOSIS — M170 Bilateral primary osteoarthritis of knee: Principal | ICD-10-CM | POA: Diagnosis present

## 2020-10-11 DIAGNOSIS — Z9221 Personal history of antineoplastic chemotherapy: Secondary | ICD-10-CM | POA: Diagnosis not present

## 2020-10-11 DIAGNOSIS — M1712 Unilateral primary osteoarthritis, left knee: Secondary | ICD-10-CM

## 2020-10-11 DIAGNOSIS — R52 Pain, unspecified: Secondary | ICD-10-CM

## 2020-10-11 DIAGNOSIS — I4891 Unspecified atrial fibrillation: Secondary | ICD-10-CM | POA: Diagnosis not present

## 2020-10-11 DIAGNOSIS — Z471 Aftercare following joint replacement surgery: Secondary | ICD-10-CM | POA: Diagnosis not present

## 2020-10-11 DIAGNOSIS — Z853 Personal history of malignant neoplasm of breast: Secondary | ICD-10-CM

## 2020-10-11 DIAGNOSIS — Z9889 Other specified postprocedural states: Secondary | ICD-10-CM | POA: Diagnosis not present

## 2020-10-11 DIAGNOSIS — Z87891 Personal history of nicotine dependence: Secondary | ICD-10-CM | POA: Diagnosis not present

## 2020-10-11 DIAGNOSIS — Z86010 Personal history of colonic polyps: Secondary | ICD-10-CM

## 2020-10-11 DIAGNOSIS — Z803 Family history of malignant neoplasm of breast: Secondary | ICD-10-CM | POA: Diagnosis not present

## 2020-10-11 DIAGNOSIS — R600 Localized edema: Secondary | ICD-10-CM | POA: Diagnosis not present

## 2020-10-11 DIAGNOSIS — Z923 Personal history of irradiation: Secondary | ICD-10-CM

## 2020-10-11 DIAGNOSIS — G8918 Other acute postprocedural pain: Secondary | ICD-10-CM | POA: Diagnosis not present

## 2020-10-11 DIAGNOSIS — Z888 Allergy status to other drugs, medicaments and biological substances status: Secondary | ICD-10-CM | POA: Diagnosis not present

## 2020-10-11 DIAGNOSIS — K449 Diaphragmatic hernia without obstruction or gangrene: Secondary | ICD-10-CM | POA: Diagnosis not present

## 2020-10-11 HISTORY — PX: TOTAL KNEE ARTHROPLASTY: SHX125

## 2020-10-11 SURGERY — ARTHROPLASTY, KNEE, TOTAL
Anesthesia: Spinal | Site: Knee | Laterality: Left

## 2020-10-11 MED ORDER — PHENOL 1.4 % MT LIQD: 1.0000 | OROMUCOSAL | Status: DC | PRN

## 2020-10-11 MED ORDER — METOCLOPRAMIDE HCL 5 MG/ML IJ SOLN: 5.0000 mg | Freq: Three times a day (TID) | INTRAMUSCULAR | Status: DC | PRN

## 2020-10-11 MED ORDER — PHENYLEPHRINE HCL-NACL 20-0.9 MG/250ML-% IV SOLN
INTRAVENOUS | Status: DC | PRN
  Administered 2020-10-11: 20 ug/min via INTRAVENOUS

## 2020-10-11 MED ORDER — DOCUSATE SODIUM 100 MG PO CAPS
100.0000 mg | ORAL_CAPSULE | Freq: Two times a day (BID) | ORAL | Status: DC
  Administered 2020-10-11 – 2020-10-12 (×2): 100 mg via ORAL
  Filled 2020-10-11 (×2): qty 1

## 2020-10-11 MED ORDER — ONDANSETRON HCL 4 MG/2ML IJ SOLN: 4.0000 mg | Freq: Four times a day (QID) | INTRAMUSCULAR | Status: DC | PRN

## 2020-10-11 MED ORDER — IRRISEPT - 450ML BOTTLE WITH 0.05% CHG IN STERILE WATER, USP 99.95% OPTIME
TOPICAL | Status: DC | PRN
  Administered 2020-10-11: 450 mL

## 2020-10-11 MED ORDER — POVIDONE-IODINE 10 % EX SWAB
2.0000 "application " | Freq: Once | CUTANEOUS | Status: AC
  Administered 2020-10-11: 2 via TOPICAL

## 2020-10-11 MED ORDER — VANCOMYCIN HCL 1000 MG IV SOLR
INTRAVENOUS | Status: DC | PRN
  Administered 2020-10-11: 1000 mg

## 2020-10-11 MED ORDER — ONDANSETRON HCL 4 MG PO TABS: 4.0000 mg | ORAL_TABLET | Freq: Four times a day (QID) | ORAL | Status: DC | PRN

## 2020-10-11 MED ORDER — SUMATRIPTAN SUCCINATE 100 MG PO TABS
100.0000 mg | ORAL_TABLET | ORAL | Status: DC | PRN
  Filled 2020-10-11: qty 1

## 2020-10-11 MED ORDER — MIDAZOLAM HCL 2 MG/2ML IJ SOLN: 1.0000 mg | Freq: Once | INTRAMUSCULAR | Status: AC

## 2020-10-11 MED ORDER — METOCLOPRAMIDE HCL 5 MG PO TABS: 5.0000 mg | ORAL_TABLET | Freq: Three times a day (TID) | ORAL | Status: DC | PRN

## 2020-10-11 MED ORDER — MENTHOL 3 MG MT LOZG: 1.0000 | LOZENGE | OROMUCOSAL | Status: DC | PRN

## 2020-10-11 MED ORDER — HYDROMORPHONE HCL 1 MG/ML IJ SOLN
0.5000 mg | INTRAMUSCULAR | Status: DC | PRN
  Administered 2020-10-12: 0.5 mg via INTRAVENOUS
  Administered 2020-10-12: 1 mg via INTRAVENOUS
  Filled 2020-10-11 (×2): qty 1

## 2020-10-11 MED ORDER — ORAL CARE MOUTH RINSE: 15.0000 mL | Freq: Once | OROMUCOSAL | Status: AC

## 2020-10-11 MED ORDER — OXYCODONE HCL 5 MG PO TABS: 5.0000 mg | ORAL_TABLET | Freq: Once | ORAL | Status: DC | PRN

## 2020-10-11 MED ORDER — OXYCODONE HCL 5 MG PO TABS
5.0000 mg | ORAL_TABLET | ORAL | Status: DC | PRN
  Administered 2020-10-11: 10 mg via ORAL
  Filled 2020-10-11 (×2): qty 2

## 2020-10-11 MED ORDER — PROPOFOL 10 MG/ML IV BOLUS
INTRAVENOUS | Status: DC | PRN
  Administered 2020-10-11: 10 mg via INTRAVENOUS
  Administered 2020-10-11 (×4): 20 mg via INTRAVENOUS
  Administered 2020-10-11: 10 mg via INTRAVENOUS
  Administered 2020-10-11: 20 mg via INTRAVENOUS

## 2020-10-11 MED ORDER — LACTATED RINGERS IV SOLN: INTRAVENOUS | Status: DC

## 2020-10-11 MED ORDER — OXYCODONE HCL 5 MG PO TABS: 10.0000 mg | ORAL_TABLET | ORAL | Status: DC | PRN

## 2020-10-11 MED ORDER — FENTANYL CITRATE (PF) 100 MCG/2ML IJ SOLN: 50.0000 ug | Freq: Once | INTRAMUSCULAR | Status: AC

## 2020-10-11 MED ORDER — TRANEXAMIC ACID-NACL 1000-0.7 MG/100ML-% IV SOLN
1000.0000 mg | Freq: Once | INTRAVENOUS | Status: AC
  Administered 2020-10-12: 1000 mg via INTRAVENOUS
  Filled 2020-10-11: qty 100

## 2020-10-11 MED ORDER — MIDAZOLAM HCL 2 MG/2ML IJ SOLN
INTRAMUSCULAR | Status: AC
  Administered 2020-10-11: 1 mg via INTRAVENOUS
  Filled 2020-10-11: qty 2

## 2020-10-11 MED ORDER — METHOCARBAMOL 500 MG PO TABS
500.0000 mg | ORAL_TABLET | Freq: Four times a day (QID) | ORAL | Status: DC | PRN
  Administered 2020-10-11: 500 mg via ORAL
  Filled 2020-10-11: qty 1

## 2020-10-11 MED ORDER — FENTANYL CITRATE (PF) 100 MCG/2ML IJ SOLN
INTRAMUSCULAR | Status: AC
  Administered 2020-10-11: 50 ug via INTRAVENOUS
  Filled 2020-10-11: qty 2

## 2020-10-11 MED ORDER — LIDOCAINE 2% (20 MG/ML) 5 ML SYRINGE
INTRAMUSCULAR | Status: DC | PRN
  Administered 2020-10-11: 40 mg via INTRAVENOUS

## 2020-10-11 MED ORDER — BUPIVACAINE-MELOXICAM ER 400-12 MG/14ML IJ SOLN
INTRAMUSCULAR | Status: AC
  Filled 2020-10-11: qty 1

## 2020-10-11 MED ORDER — METHOCARBAMOL 1000 MG/10ML IJ SOLN
500.0000 mg | Freq: Four times a day (QID) | INTRAVENOUS | Status: DC | PRN
  Filled 2020-10-11: qty 5

## 2020-10-11 MED ORDER — 0.9 % SODIUM CHLORIDE (POUR BTL) OPTIME
TOPICAL | Status: DC | PRN
  Administered 2020-10-11: 1000 mL

## 2020-10-11 MED ORDER — SODIUM CHLORIDE 0.9 % IV SOLN: INTRAVENOUS | Status: DC

## 2020-10-11 MED ORDER — FENTANYL CITRATE (PF) 100 MCG/2ML IJ SOLN
25.0000 ug | INTRAMUSCULAR | Status: DC | PRN
  Administered 2020-10-11: 50 ug via INTRAVENOUS

## 2020-10-11 MED ORDER — CITALOPRAM HYDROBROMIDE 20 MG PO TABS
40.0000 mg | ORAL_TABLET | Freq: Every day | ORAL | Status: DC
  Administered 2020-10-12: 40 mg via ORAL
  Filled 2020-10-11: qty 2

## 2020-10-11 MED ORDER — PROPOFOL 500 MG/50ML IV EMUL
INTRAVENOUS | Status: DC | PRN
  Administered 2020-10-11: 50 ug/kg/min via INTRAVENOUS

## 2020-10-11 MED ORDER — BUPIVACAINE-MELOXICAM ER 400-12 MG/14ML IJ SOLN
INTRAMUSCULAR | Status: DC | PRN
  Administered 2020-10-11: 400 mg

## 2020-10-11 MED ORDER — APIXABAN 5 MG PO TABS
5.0000 mg | ORAL_TABLET | Freq: Two times a day (BID) | ORAL | Status: DC
  Administered 2020-10-12: 5 mg via ORAL
  Filled 2020-10-11: qty 1

## 2020-10-11 MED ORDER — VANCOMYCIN HCL 1000 MG IV SOLR
INTRAVENOUS | Status: AC
  Filled 2020-10-11: qty 1000

## 2020-10-11 MED ORDER — TRANEXAMIC ACID 1000 MG/10ML IV SOLN
2000.0000 mg | INTRAVENOUS | Status: DC
  Filled 2020-10-11: qty 20

## 2020-10-11 MED ORDER — SODIUM CHLORIDE 0.9 % IR SOLN
Status: DC | PRN
  Administered 2020-10-11: 1000 mL

## 2020-10-11 MED ORDER — CEFAZOLIN SODIUM-DEXTROSE 2-4 GM/100ML-% IV SOLN
2.0000 g | INTRAVENOUS | Status: AC
  Administered 2020-10-11: 2 g via INTRAVENOUS
  Filled 2020-10-11: qty 100

## 2020-10-11 MED ORDER — ACETAMINOPHEN 500 MG PO TABS
1000.0000 mg | ORAL_TABLET | Freq: Four times a day (QID) | ORAL | Status: AC
Start: 2020-10-11 — End: 2020-10-12
  Administered 2020-10-11 – 2020-10-12 (×4): 1000 mg via ORAL
  Filled 2020-10-11 (×4): qty 2

## 2020-10-11 MED ORDER — ACETAMINOPHEN 325 MG PO TABS: 325.0000 mg | ORAL_TABLET | Freq: Four times a day (QID) | ORAL | Status: DC | PRN

## 2020-10-11 MED ORDER — PROPOFOL 10 MG/ML IV BOLUS
INTRAVENOUS | Status: AC
  Filled 2020-10-11: qty 20

## 2020-10-11 MED ORDER — BUPIVACAINE IN DEXTROSE 0.75-8.25 % IT SOLN
INTRATHECAL | Status: DC | PRN
  Administered 2020-10-11: 1.6 mL via INTRATHECAL

## 2020-10-11 MED ORDER — TRANEXAMIC ACID-NACL 1000-0.7 MG/100ML-% IV SOLN
1000.0000 mg | INTRAVENOUS | Status: AC
  Administered 2020-10-11: 1000 mg via INTRAVENOUS
  Filled 2020-10-11: qty 100

## 2020-10-11 MED ORDER — CHLORHEXIDINE GLUCONATE 0.12 % MT SOLN
15.0000 mL | Freq: Once | OROMUCOSAL | Status: AC
  Administered 2020-10-11: 15 mL via OROMUCOSAL
  Filled 2020-10-11: qty 15

## 2020-10-11 MED ORDER — TRANEXAMIC ACID 1000 MG/10ML IV SOLN
INTRAVENOUS | Status: DC | PRN
  Administered 2020-10-11: 2000 mg via TOPICAL

## 2020-10-11 MED ORDER — KETOROLAC TROMETHAMINE 30 MG/ML IJ SOLN
30.0000 mg | INTRAMUSCULAR | Status: AC
  Administered 2020-10-11: 30 mg via INTRAVENOUS
  Filled 2020-10-11: qty 1

## 2020-10-11 MED ORDER — OXYCODONE HCL 5 MG/5ML PO SOLN: 5.0000 mg | Freq: Once | ORAL | Status: DC | PRN

## 2020-10-11 MED ORDER — FENTANYL CITRATE (PF) 100 MCG/2ML IJ SOLN
INTRAMUSCULAR | Status: AC
  Filled 2020-10-11: qty 2

## 2020-10-11 MED ORDER — CEFAZOLIN SODIUM-DEXTROSE 2-4 GM/100ML-% IV SOLN
2.0000 g | Freq: Three times a day (TID) | INTRAVENOUS | Status: AC
  Administered 2020-10-11 – 2020-10-12 (×2): 2 g via INTRAVENOUS
  Filled 2020-10-11 (×2): qty 100

## 2020-10-11 MED ORDER — DEXAMETHASONE SODIUM PHOSPHATE 10 MG/ML IJ SOLN
10.0000 mg | Freq: Once | INTRAMUSCULAR | Status: AC
  Administered 2020-10-12: 10 mg via INTRAVENOUS
  Filled 2020-10-11: qty 1

## 2020-10-11 SURGICAL SUPPLY — 88 items
ADH SKN CLS APL DERMABOND .7 (GAUZE/BANDAGES/DRESSINGS) ×1
ALCOHOL 70% 16 OZ (MISCELLANEOUS) ×2 IMPLANT
BAG COUNTER SPONGE SURGICOUNT (BAG) IMPLANT
BAG DECANTER FOR FLEXI CONT (MISCELLANEOUS) ×2 IMPLANT
BAG SPNG CNTER NS LX DISP (BAG)
BANDAGE ESMARK 6X9 LF (GAUZE/BANDAGES/DRESSINGS) IMPLANT
BLADE SAG 18X100X1.27 (BLADE) ×2 IMPLANT
BNDG CMPR 9X6 STRL LF SNTH (GAUZE/BANDAGES/DRESSINGS)
BNDG ESMARK 6X9 LF (GAUZE/BANDAGES/DRESSINGS)
BOWL SMART MIX CTS (DISPOSABLE) ×2 IMPLANT
BSPLAT TIB 5D E CMNT STM LT (Knees) ×1 IMPLANT
CEMENT BONE REFOBACIN R1X40 US (Cement) ×2 IMPLANT
CLSR STERI-STRIP ANTIMIC 1/2X4 (GAUZE/BANDAGES/DRESSINGS) ×4 IMPLANT
CNTNR URN SCR LID CUP LEK RST (MISCELLANEOUS) IMPLANT
CONT SPEC 4OZ STRL OR WHT (MISCELLANEOUS) ×2
COOLER ICEMAN CLASSIC (MISCELLANEOUS) ×2 IMPLANT
COVER SURGICAL LIGHT HANDLE (MISCELLANEOUS) ×2 IMPLANT
CUFF TOURN SGL QUICK 34 (TOURNIQUET CUFF) ×2
CUFF TRNQT CYL 34X4.125X (TOURNIQUET CUFF) ×1 IMPLANT
DERMABOND ADVANCED (GAUZE/BANDAGES/DRESSINGS) ×1
DERMABOND ADVANCED .7 DNX12 (GAUZE/BANDAGES/DRESSINGS) ×1 IMPLANT
DRAPE EXTREMITY T 121X128X90 (DISPOSABLE) ×2 IMPLANT
DRAPE HALF SHEET 40X57 (DRAPES) ×2 IMPLANT
DRAPE INCISE IOBAN 66X45 STRL (DRAPES) IMPLANT
DRAPE ORTHO SPLIT 77X108 STRL (DRAPES) ×4
DRAPE POUCH INSTRU U-SHP 10X18 (DRAPES) ×2 IMPLANT
DRAPE SURG ORHT 6 SPLT 77X108 (DRAPES) ×2 IMPLANT
DRAPE U-SHAPE 47X51 STRL (DRAPES) ×4 IMPLANT
DRSG AQUACEL AG ADV 3.5X10 (GAUZE/BANDAGES/DRESSINGS) ×2 IMPLANT
DRSG AQUACEL AG ADV 3.5X14 (GAUZE/BANDAGES/DRESSINGS) ×1 IMPLANT
DURAPREP 26ML APPLICATOR (WOUND CARE) ×6 IMPLANT
ELECT CAUTERY BLADE 6.4 (BLADE) ×2 IMPLANT
ELECT REM PT RETURN 9FT ADLT (ELECTROSURGICAL) ×2
ELECTRODE REM PT RTRN 9FT ADLT (ELECTROSURGICAL) ×1 IMPLANT
FEMUR CMT CR STD SZ 6 LT KNEE (Joint) ×2 IMPLANT
FEMUR CMTD CR STD SZ 6 LT KNEE (Joint) IMPLANT
GLOVE SURG LTX SZ7 (GLOVE) ×6 IMPLANT
GLOVE SURG NEOP MICRO LF SZ7.5 (GLOVE) ×6 IMPLANT
GLOVE SURG SYN 7.5  E (GLOVE) ×8
GLOVE SURG SYN 7.5 E (GLOVE) ×4 IMPLANT
GLOVE SURG SYN 7.5 PF PI (GLOVE) ×4 IMPLANT
GLOVE SURG UNDER POLY LF SZ7 (GLOVE) ×10 IMPLANT
GOWN STRL REIN XL XLG (GOWN DISPOSABLE) ×2 IMPLANT
GOWN STRL REUS W/ TWL LRG LVL3 (GOWN DISPOSABLE) ×1 IMPLANT
GOWN STRL REUS W/TWL LRG LVL3 (GOWN DISPOSABLE) ×2
HANDPIECE INTERPULSE COAX TIP (DISPOSABLE) ×2
HDLS TROCR DRIL PIN KNEE 75 (PIN) ×2
HOOD PEEL AWAY FLYTE STAYCOOL (MISCELLANEOUS) ×4 IMPLANT
JET LAVAGE IRRISEPT WOUND (IRRIGATION / IRRIGATOR) ×2
KIT BASIN OR (CUSTOM PROCEDURE TRAY) ×2 IMPLANT
KIT TURNOVER KIT B (KITS) ×2 IMPLANT
LAVAGE JET IRRISEPT WOUND (IRRIGATION / IRRIGATOR) ×1 IMPLANT
MANIFOLD NEPTUNE II (INSTRUMENTS) ×2 IMPLANT
MARKER SKIN DUAL TIP RULER LAB (MISCELLANEOUS) ×2 IMPLANT
NDL SPNL 18GX3.5 QUINCKE PK (NEEDLE) ×2 IMPLANT
NEEDLE SPNL 18GX3.5 QUINCKE PK (NEEDLE) ×4 IMPLANT
NS IRRIG 1000ML POUR BTL (IV SOLUTION) ×2 IMPLANT
PACK TOTAL JOINT (CUSTOM PROCEDURE TRAY) ×2 IMPLANT
PAD ARMBOARD 7.5X6 YLW CONV (MISCELLANEOUS) ×4 IMPLANT
PAD COLD SHLDR WRAP-ON (PAD) ×2 IMPLANT
PIN DRILL HDLS TROCAR 75 4PK (PIN) IMPLANT
SAW OSC TIP CART 19.5X105X1.3 (SAW) ×2 IMPLANT
SCREW FEMALE HEX FIX 25X2.5 (ORTHOPEDIC DISPOSABLE SUPPLIES) ×1 IMPLANT
SET HNDPC FAN SPRY TIP SCT (DISPOSABLE) ×1 IMPLANT
SPONGE T-LAP 18X18 ~~LOC~~+RFID (SPONGE) ×3 IMPLANT
STAPLER VISISTAT 35W (STAPLE) IMPLANT
STEM MED AS PERS SZ6-7 11 (Stem) ×1 IMPLANT
STEM POLY PAT PLY 32M KNEE (Knees) ×1 IMPLANT
STEM TIBIA 5 DEG SZ E L KNEE (Knees) IMPLANT
SUCTION FRAZIER HANDLE 10FR (MISCELLANEOUS) ×2
SUCTION FRAZIER TIP 8 FR DISP (SUCTIONS) ×2
SUCTION TUBE FRAZIER 10FR DISP (MISCELLANEOUS) ×1 IMPLANT
SUCTION TUBE FRAZIER 8FR DISP (SUCTIONS) IMPLANT
SUT ETHILON 2 0 FS 18 (SUTURE) ×3 IMPLANT
SUT MNCRL AB 4-0 PS2 18 (SUTURE) IMPLANT
SUT VIC AB 0 CT1 27 (SUTURE) ×4
SUT VIC AB 0 CT1 27XBRD ANBCTR (SUTURE) ×2 IMPLANT
SUT VIC AB 1 CTX 27 (SUTURE) ×6 IMPLANT
SUT VIC AB 2-0 CT1 27 (SUTURE) ×10
SUT VIC AB 2-0 CT1 TAPERPNT 27 (SUTURE) ×4 IMPLANT
SYR 50ML LL SCALE MARK (SYRINGE) ×4 IMPLANT
TIBIA STEM 5 DEG SZ E L KNEE (Knees) ×2 IMPLANT
TOWEL GREEN STERILE (TOWEL DISPOSABLE) ×2 IMPLANT
TOWEL GREEN STERILE FF (TOWEL DISPOSABLE) ×2 IMPLANT
TRAY CATH 16FR W/PLASTIC CATH (SET/KITS/TRAYS/PACK) IMPLANT
UNDERPAD 30X36 HEAVY ABSORB (UNDERPADS AND DIAPERS) ×2 IMPLANT
WRAP KNEE MAXI GEL POST OP (GAUZE/BANDAGES/DRESSINGS) ×2 IMPLANT
YANKAUER SUCT BULB TIP NO VENT (SUCTIONS) ×3 IMPLANT

## 2020-10-11 NOTE — Anesthesia Procedure Notes (Signed)
Anesthesia Regional Block: Adductor canal block   Pre-Anesthetic Checklist: , timeout performed,  Correct Patient, Correct Site, Correct Laterality,  Correct Procedure, Correct Position, site marked,  Risks and benefits discussed,  Pre-op evaluation,  At surgeon's request and post-op pain management  Laterality: Left  Prep: Maximum Sterile Barrier Precautions used, chloraprep       Needles:  Injection technique: Single-shot  Needle Type: Echogenic Stimulator Needle     Needle Length: 9cm  Needle Gauge: 22     Additional Needles:   Procedures:,,,, ultrasound used (permanent image in chart),,    Narrative:  Start time: 10/11/2020 11:47 AM End time: 10/11/2020 11:50 AM Injection made incrementally with aspirations every 5 mL.  Performed by: Personally  Anesthesiologist: Brennan Bailey, MD  Additional Notes: Risks, benefits, and alternative discussed. Patient gave consent for procedure. Patient prepped and draped in sterile fashion. Sedation administered, patient remains easily responsive to voice. Relevant anatomy identified with ultrasound guidance. Local anesthetic given in 5cc increments with no signs or symptoms of intravascular injection. No pain or paraesthesias with injection. Patient monitored throughout procedure with signs of LAST or immediate complications. Tolerated well. Ultrasound image placed in chart.  Tawny Asal, MD

## 2020-10-11 NOTE — Anesthesia Procedure Notes (Signed)
Spinal  Patient location during procedure: OR Start time: 10/11/2020 12:16 PM End time: 10/11/2020 12:19 PM Reason for block: surgical anesthesia Staffing Performed: anesthesiologist  Anesthesiologist: Brennan Bailey, MD Preanesthetic Checklist Completed: patient identified, IV checked, risks and benefits discussed, surgical consent, monitors and equipment checked, pre-op evaluation and timeout performed Spinal Block Patient position: sitting Prep: DuraPrep and site prepped and draped Patient monitoring: continuous pulse ox, blood pressure and heart rate Approach: midline Location: L3-4 Injection technique: single-shot Needle Needle type: Pencan  Needle gauge: 24 G Needle length: 9 cm Assessment Events: CSF return Additional Notes Risks, benefits, and alternative discussed. Patient gave consent to procedure. Prepped and draped in sitting position. Patient sedated but responsive to voice. Clear CSF obtained after one needle pass. Positive terminal aspiration. No pain or paraesthesias with injection. Patient tolerated procedure well. Vital signs stable. Tawny Asal, MD

## 2020-10-11 NOTE — Anesthesia Procedure Notes (Signed)
Procedure Name: MAC Date/Time: 10/11/2020 12:10 PM Performed by: Mariea Clonts, CRNA Pre-anesthesia Checklist: Patient identified, Emergency Drugs available, Suction available, Patient being monitored and Timeout performed Patient Re-evaluated:Patient Re-evaluated prior to induction Oxygen Delivery Method: Simple face mask and Nasal cannula

## 2020-10-11 NOTE — Anesthesia Postprocedure Evaluation (Signed)
Anesthesia Post Note  Patient: Brittany [name]  Procedure(s) Performed: TOTAL KNEE ARTHROPLASTY (Left: Knee)     Patient location during evaluation: PACU Anesthesia Type: Spinal Level of consciousness: awake and alert and oriented Pain management: pain level controlled Vital Signs Assessment: post-procedure vital signs reviewed and stable Respiratory status: spontaneous breathing, nonlabored ventilation and respiratory function stable Cardiovascular status: blood pressure returned to baseline Postop Assessment: no apparent nausea or vomiting, spinal receding, no headache and no backache Anesthetic complications: no   No notable events documented.  Last Vitals:  Vitals:   10/11/20 1521 10/11/20 1551  BP: (!) 98/50 (!) 109/51  Pulse: 75 75  Resp: (!) 22 17  Temp: (!) 36.4 C   SpO2: 94% 97%    Last Pain:  Vitals:   10/11/20 1521  TempSrc:   PainSc: 0-No pain                 Brennan Bailey

## 2020-10-11 NOTE — Op Note (Addendum)
Total Knee Arthroplasty Procedure Note  Preoperative diagnosis: Left knee osteoarthritis  Postoperative diagnosis:same  Operative procedure: Left total knee arthroplasty. CPT [id number]  Surgeon: N. Eduard Roux, MD  Assist: None  Anesthesia: Spinal, regional, local  Tourniquet time: see anesthesia record  Implants used: Zimmer persona Femur: CR 6 Tibia: E Patella: 32 mm Polyethylene: 11 mm, MC  Indication: Brittany [name] is a 75 y.o. year old female with a history of knee pain. Having failed conservative management, the patient elected to proceed with a total knee arthroplasty.  We have reviewed the risk and benefits of the surgery and they elected to proceed after voicing understanding.  Procedure:  After informed consent was obtained and understanding of the risk were voiced including but not limited to bleeding, infection, damage to surrounding structures including nerves and vessels, blood clots, leg length inequality and the failure to achieve desired results, the operative extremity was marked with verbal confirmation of the patient in the holding area.   The patient was then brought to the operating room and transported to the operating room table in the supine position.  A tourniquet was applied to the operative extremity around the upper thigh. The operative limb was then prepped and draped in the usual sterile fashion and preoperative antibiotics were administered.  A time out was performed prior to the start of surgery confirming the correct extremity, preoperative antibiotic administration, as well as team members, implants and instruments available for the case. Correct surgical site was also confirmed with preoperative radiographs. The limb was then elevated for exsanguination and the tourniquet was inflated. A midline incision was made and a standard medial parapatellar approach was performed.  The infrapatellar fat pad was removed.  Suprapatellar synovium was removed to  reveal the anterior distal femoral cortex.  A medial peel was performed to release the capsule of the medial tibial plateau.  The patella was then everted which showed completely denuded cartilage and was prepared and sized to a 32 mm.  A cover was placed on the patella for protection from retractors.  The knee was then brought into full flexion which showed widespread denuded cartilage of both condyles worse on the lateral and we then turned our attention to the femur.  The cruciates were sacrificed.  Start site was drilled in the femur and the intramedullary distal femoral cutting guide was placed, set at 5 degrees valgus, taking 10 mm of distal resection. The distal cut was made. Osteophytes were then removed.  Next, the proximal tibial cutting guide was placed with appropriate slope, varus/valgus alignment and depth of resection. The proximal tibial cut was made. Gap blocks were then used to assess the extension gap and alignment, and appropriate soft tissue releases were performed. Attention was turned back to the femur, which was sized using the sizing guide to a size 6. Appropriate rotation of the femoral component was determined using epicondylar axis, Whiteside's line, and assessing the flexion gap under ligament tension. The appropriate size 4-in-1 cutting block was placed and checked with an angel wing and cuts were made. Posterior femoral osteophytes and uncapped bone were then removed with the curved osteotome.  Trial components were placed, and stability was checked in full extension, mid-flexion, and deep flexion. Proper tibial rotation was determined and marked.  The patella tracked well without a lateral release.  The femoral lugs were then drilled. Trial components were then removed and tibial preparation performed.  The tibia was sized for a size E component.   The  bony surfaces were irrigated with a pulse lavage and then dried. Bone cement was vacuum mixed on the back table, and the final  components sized above were cemented into place.  Antibiotic irrigation was placed in the knee joint and soft tissues while the cement cured.  After cement had finished curing, excess cement was removed. The stability of the construct was re-evaluated throughout a range of motion and found to be acceptable. The trial liner was removed, the knee was copiously irrigated, and the knee was re-evaluated for any excess bone debris. The real polyethylene liner, 11 mm thick, was inserted and checked to ensure the locking mechanism had engaged appropriately. The tourniquet was deflated and hemostasis was achieved. The wound was irrigated with normal saline.  One gram of vancomycin powder was placed in the surgical bed.  Capsular closure was performed with a #1 vicryl, subcutaneous fat closed with a 0 vicryl suture, then subcutaneous tissue closed with interrupted 2.0 vicryl suture. The skin was then closed with a 2.0 nylon and dermabond. A sterile dressing was applied.  The patient was awakened in the operating room and taken to recovery in stable condition. All sponge, needle, and instrument counts were correct at the end of the case.  Position: supine  Complications: none.  Time Out: performed   Drains/Packing: none  Estimated blood loss: minimal  Returned to Recovery Room: in good condition.   Antibiotics: yes   Mechanical VTE (DVT) Prophylaxis: sequential compression devices, TED thigh-high  Chemical VTE (DVT) Prophylaxis: aspirin  Fluid Replacement  Crystalloid: see anesthesia record Blood: none  FFP: none   Specimens Removed: 1 to pathology   Sponge and Instrument Count Correct? yes   PACU: portable radiograph - knee AP and Lateral   Plan/RTC: Return in 2 weeks for wound check.   Weight Bearing/Load Lower Extremity: full   Implant Name Type Inv. Item Serial No. Manufacturer Lot No. LRB No. Used Action  CEMENT BONE REFOBACIN [id number] [place] - [id number] Cement CEMENT BONE REFOBACIN [id number] [place]   ZIMMER RECON(ORTH,TRAU,BIO,SG) [id number] Left 2 Implanted  STEM POLY PAT PLY 20M KNEE - [id number] Knees STEM POLY PAT PLY 20M KNEE  ZIMMER RECON(ORTH,TRAU,BIO,SG) [id number] Left 1 Implanted  TIBIA STEM 5 DEG SZ E L KNEE - [id number] Knees TIBIA STEM 5 DEG SZ E L KNEE  ZIMMER RECON(ORTH,TRAU,BIO,SG) [id number] Left 1 Implanted  FEMUR CMT CR STD SZ 6 LT KNEE - [id number] Joint FEMUR CMT CR STD SZ 6 LT KNEE  ZIMMER RECON(ORTH,TRAU,BIO,SG) [id number] Left 1 Implanted  STEM MED AS PERS SZ6-7 11 - [id number] Stem STEM MED AS PERS SZ6-7 11  ZIMMER RECON(ORTH,TRAU,BIO,SG) [id number] Left 1 Implanted    N. Eduard Roux, MD [facility] 2:42 PM

## 2020-10-11 NOTE — Care Plan (Signed)
Ortho Bundle Case Management Note  Patient Details  Name: Brittany [name] MRN: [id number] Date of Birth: 1945/08/07   [facility] call to patient to discuss her upcoming Left total knee replacement with Dr. Erlinda Hong. She is an Ortho bundle patient through [facility] and is agreeable to case management. She lives with her husband, who will be assisting her at home after discharge. Anticipate HHPT will be needed after short hospital stay. Referral to [facility] after choice provided. All DME ordered through [facility], which has already been delivered to her home (CPM, FWW). Reviewed all post op care instructions and will continue to follow for needs.                  DME Arranged:  3-N-1, CPM, Walker rolling DME Agency:  [employer]  HH Arranged:  PT HH Agency:  [facility]  Additional Comments: Please contact me with any questions of if this plan should need to change.  Jamse Arn, RN, BSN, [employer]  [phone] 10/11/2020, 4:40 PM

## 2020-10-11 NOTE — Transfer of Care (Signed)
Immediate Anesthesia Transfer of Care Note  Patient: Brittany [name]  Procedure(s) Performed: TOTAL KNEE ARTHROPLASTY (Left: Knee)  Patient Location: PACU  Anesthesia Type:Regional and Spinal  Level of Consciousness: awake, alert  and oriented  Airway & Oxygen Therapy: Patient Spontanous Breathing and Patient connected to nasal cannula oxygen  Post-op Assessment: Report given to RN and Post -op Vital signs reviewed and stable  Post vital signs: Reviewed and stable  Last Vitals:  Vitals Value Taken Time  BP 113/97 10/11/20 1451  Temp    Pulse 124 10/11/20 1458  Resp 20 10/11/20 1458  SpO2 100 % 10/11/20 1458  Vitals shown include unvalidated device data.  Last Pain:  Vitals:   10/11/20 1037  TempSrc:   PainSc: 5       Patients Stated Pain Goal: 2 ([id number])  Complications: No notable events documented.

## 2020-10-11 NOTE — Evaluation (Signed)
Physical Therapy Evaluation Patient Details Name: Brittany [name] MRN: [id number] DOB: 12/30/1945 Today's Date: 10/11/2020   History of Present Illness  Pt is 75 yo female s/p L TKA on 10/11/20. She has hx including but not limited to afib, anxiety, arthritis, breast CA, and R TKA 2017  Clinical Impression  Pt is s/p TKA resulting in the deficits listed below (see PT Problem List). At baseline , pt is very independent and active.  She has 24 hr support and DME at home.  Pt was seen for evaluation on DOS and did very well.  She had good quad activation and pain control in L knee.  She was able to ambulate 40' in her room with min guard assistance.  She did have c/o significant pain in L low back rated at 9/10 in supine that was immediately relieved with sitting and standing to a 2/10.  Pt with excellent rehab potential.  Pt will benefit from skilled PT to increase their independence and safety with mobility to allow discharge to the venue listed below.      Follow Up Recommendations Follow surgeon's recommendation for DC plan and follow-up therapies;Supervision for mobility/OOB    Equipment Recommendations  None recommended by PT (has DME)    Recommendations for Other Services       Precautions / Restrictions Precautions Precautions: Fall Restrictions Weight Bearing Restrictions: Yes LLE Weight Bearing: Weight bearing as tolerated      Mobility  Bed Mobility Overal bed mobility: Needs Assistance Bed Mobility: Supine to Sit     Supine to sit: Supervision          Transfers Overall transfer level: Needs assistance Equipment used: Rolling walker (2 wheeled) Transfers: Sit to/from Stand Sit to Stand: Min guard         General transfer comment: cues for safe hand placement and L LE management  Ambulation/Gait Ambulation/Gait assistance: Min guard Gait Distance (Feet): 40 Feet Assistive device: Rolling walker (2 wheeled) Gait Pattern/deviations: Step-to pattern;Decreased  stride length;Decreased stance time - left Gait velocity: decreased   General Gait Details: Min decrease in stance time on L; min cues for RW use; tolerated well  Stairs            Wheelchair Mobility    Modified Rankin (Stroke Patients Only)       Balance Overall balance assessment: Needs assistance Sitting-balance support: No upper extremity supported Sitting balance-Leahy Scale: Good     Standing balance support: No upper extremity supported;Bilateral upper extremity supported Standing balance-Leahy Scale: Fair Standing balance comment: Could static stand without AD but utilized RW for ambulation                             Pertinent Vitals/Pain Pain Assessment: 0-10 Pain Score: 9  (was 9/10 in bed relieved to 2/10 in chair) Pain Location: back 9/10 in bed, 2/10 in chair.  No pain in L knee Pain Descriptors / Indicators: Sore;Discomfort;Aching Pain Intervention(s): Limited activity within patient's tolerance;Monitored during session;Repositioned;Ice applied    Home Living Family/patient expects to be discharged to:: Private residence Living Arrangements: Spouse/significant other Available Help at Discharge: Family;Available 24 hours/day Type of Home: House Home Access: Stairs to enter Entrance Stairs-Rails: None Entrance Stairs-Number of Steps: 2 Home Layout: One level Home Equipment: Walker - 2 wheels;Bedside commode;Shower seat;Grab bars - tub/shower      Prior Function Level of Independence: Independent         Comments: Completely independent  and can ambulate in community     Hand Dominance        Extremity/Trunk Assessment   Upper Extremity Assessment Upper Extremity Assessment: Overall WFL for tasks assessed    Lower Extremity Assessment Lower Extremity Assessment: LLE deficits/detail;RLE deficits/detail RLE Deficits / Details: ROM WFL; MMT 5/5 LLE Deficits / Details: Expected post op changes; ROM L knee 8 to 90 degrees; MMT  : ankle 5/5, knee 2/5, hip 3/5 LLE Sensation: WNL    Cervical / Trunk Assessment Cervical / Trunk Assessment: Normal  Communication   Communication: No difficulties  Cognition Arousal/Alertness: Awake/alert Behavior During Therapy: WFL for tasks assessed/performed Overall Cognitive Status: Within Functional Limits for tasks assessed                                 General Comments: Very alert and oriented, good safety awareness      General Comments General comments (skin integrity, edema, etc.): VSS on RA; Encouraged to perform ankle pumps and quad sets tonight - pt demonstrated    Exercises Total Joint Exercises Ankle Circles/Pumps: AROM;Both;5 reps;Seated Quad Sets: AROM;Both;5 reps;Seated   Assessment/Plan    PT Assessment Patient needs continued PT services  PT Problem List Decreased strength;Decreased mobility;Decreased range of motion;Decreased activity tolerance;Decreased balance;Decreased knowledge of use of DME;Pain       PT Treatment Interventions DME instruction;Therapeutic activities;Modalities;Gait training;Therapeutic exercise;Patient/family education;Stair training;Balance training;Functional mobility training    PT Goals (Current goals can be found in the Care Plan section)  Acute Rehab PT Goals Patient Stated Goal: return home PT Goal Formulation: With patient/family Time For Goal Achievement: 10/25/20 Potential to Achieve Goals: Good    Frequency 7X/week   Barriers to discharge        Co-evaluation               AM-PAC PT "6 Clicks" Mobility  Outcome Measure Help needed turning from your back to your side while in a flat bed without using bedrails?: None Help needed moving from lying on your back to sitting on the side of a flat bed without using bedrails?: None Help needed moving to and from a bed to a chair (including a wheelchair)?: A Little Help needed standing up from a chair using your arms (e.g., wheelchair or bedside  chair)?: A Little Help needed to walk in hospital room?: A Little Help needed climbing 3-5 steps with a railing? : A Little 6 Click Score: 20    End of Session Equipment Utilized During Treatment: Gait belt Activity Tolerance: Patient tolerated treatment well Patient left: in chair;with call bell/phone within reach;with family/visitor present Nurse Communication: Mobility status (Did not place alarm as pt alert, oriented, good safety awareness, spouse present) PT Visit Diagnosis: Other abnormalities of gait and mobility (R26.89);Muscle weakness (generalized) (M62.81)    Time: 3086-5784 PT Time Calculation (min) (ACUTE ONLY): 22 min   Charges:   PT Evaluation $PT Eval Low Complexity: 1 Low          Winry Egnew, PT Acute Rehab Services Pager [phone] [facility] [phone]   Rayetta Humphrey 10/11/2020, 5:48 PM

## 2020-10-11 NOTE — Anesthesia Preprocedure Evaluation (Addendum)
Anesthesia Evaluation  Patient identified by MRN, date of birth, ID band Patient awake    Reviewed: Allergy & Precautions, NPO status , Patient's Chart, lab work & pertinent test results  History of Anesthesia Complications Negative for: history of anesthetic complications  Airway Mallampati: II  TM Distance: >3 FB Neck ROM: Full    Dental no notable dental hx.    Pulmonary sleep apnea (s/p hypoglossal n. stimulator) , former smoker,    Pulmonary exam normal        Cardiovascular Normal cardiovascular exam+ dysrhythmias (on Eliquis) Atrial Fibrillation      Neuro/Psych  Headaches, Anxiety Depression    GI/Hepatic Neg liver ROS, hiatal hernia, GERD  Medicated and Controlled,  Endo/Other  negative endocrine ROS  Renal/GU negative Renal ROS  negative genitourinary   Musculoskeletal  (+) Arthritis ,   Abdominal   Peds  Hematology negative hematology ROS (+)   Anesthesia Other Findings Day of surgery medications reviewed with patient.  Reproductive/Obstetrics negative OB ROS                            Anesthesia Physical Anesthesia Plan  ASA: 2  Anesthesia Plan: Spinal   Post-op Pain Management:  Regional for Post-op pain   Induction:   PONV Risk Score and Plan: 3 and Treatment may vary due to age or medical condition, Ondansetron, Propofol infusion and Dexamethasone  Airway Management Planned: Natural Airway and Simple Face Mask  Additional Equipment: None  Intra-op Plan:   Post-operative Plan:   Informed Consent: I have reviewed the patients History and Physical, chart, labs and discussed the procedure including the risks, benefits and alternatives for the proposed anesthesia with the patient or authorized representative who has indicated his/her understanding and acceptance.       Plan Discussed with: CRNA  Anesthesia Plan Comments:       Anesthesia Quick  Evaluation

## 2020-10-11 NOTE — Discharge Instructions (Signed)

## 2020-10-11 NOTE — H&P (Signed)
PREOPERATIVE H&P  Chief Complaint: left knee degenerative joing disease  HPI: Brittany [name] is a 75 y.o. female who presents for surgical treatment of left knee degenerative joing disease.  She denies any changes in medical history.  Past Medical History:  Diagnosis Date   A-fib ([place])    Acid reflux    Acute cystitis with positive culture 03/06/2014   Anemia    Anxiety    Arthritis    Baker cyst, left 04/06/2016   Aspirated 04/06/2016   Breast cancer ([place]) 11/23/2007   right, ER/PR -, HER 2 -   Cognitive deficits 09/25/2014   2016 Very mild, could be related to meds    Degenerative arthritis of left knee 04/06/2016   Injection and aspiration 04/06/2016   Degenerative arthritis of right knee 09/27/2015   Depression    Esophageal stricture    Gastritis 10/2007   History of chemotherapy    neoadjuvant chemo, last dose 03/2008   History of radiation therapy 07/25/08 -09/11/08   right breast   Hx of colonic polyps 08/11/2016   Hyperlipidemia    Migraines    rare   Myalgia 07/18/2013   5/15 multiple site    OSA (obstructive sleep apnea)    severe OSA AHI 34, intolerant to PAP and now s/p hypoglossal nerve stimulator implant   Osteopenia    Rupture of quadriceps tendon 03/23/2017   Past Surgical History:  Procedure Laterality Date   ATRIAL FIBRILLATION ABLATION N/A 10/07/2018   Procedure: ATRIAL FIBRILLATION ABLATION;  Surgeon: [name], MD;  Location: [facility];  Service: Cardiovascular;  Laterality: N/A;   BREAST LUMPECTOMY  06/26/2008   right, CA   XRT, chemo   COLONOSCOPY     DRUG INDUCED ENDOSCOPY N/A 02/21/2020   Procedure: DRUG INDUCED ENDOSCOPY;  Surgeon: Melida Quitter, MD;  Location: [place];  Service: ENT;  Laterality: N/A;   EYE SURGERY Bilateral    growth removed per right thigh     as teen - mole   IMPLANTATION OF HYPOGLOSSAL NERVE STIMULATOR N/A 05/15/2020   Procedure: IMPLANTATION OF HYPOGLOSSAL NERVE STIMULATOR;   Surgeon: Melida Quitter, MD;  Location: [facility];  Service: ENT;  Laterality: N/A;   JOINT REPLACEMENT  12/31/2015   RTK   QUADRICEPS TENDON REPAIR Right 09/14/2016   Procedure: OPEN REPAIR QUADRICEP TENDON;  Surgeon: [name], MD;  Location: [facility];  Service: Orthopedics;  Laterality: Right;   TOTAL KNEE ARTHROPLASTY Right 12/31/2015   Procedure: RIGHT TOTAL KNEE ARTHROPLASTY;  Surgeon: [name], MD;  Location: [facility];  Service: Orthopedics;  Laterality: Right;   UPPER GASTROINTESTINAL ENDOSCOPY     Social History   Socioeconomic History   Marital status: Married    Spouse name: Not on file   Number of children: 3   Years of education: Not on file   Highest education level: Not on file  Occupational History    Employer: RETIRED  Tobacco Use   Smoking status: Former    Packs/day: 0.25    Years: 5.00    Pack years: 1.25    Types: Cigarettes    Quit date: 03/03/1967    Years since quitting: 53.6   Smokeless tobacco: Never   Tobacco comments:    6 cig per day when she was smoking/quit years ago  Vaping Use   Vaping Use: Never used  Substance and Sexual Activity   Alcohol use: Yes    Alcohol/week: 7.0 standard drinks  Types: 7 Cans of beer per week    Comment: 1 beer per day - 2.4 % alcholol - [employer] 55   Drug use: No   Sexual activity: Yes    Partners: Male    Birth control/protection: Post-menopausal  Other Topics Concern   Not on file  Social History Narrative   G3, P3, 1st pregnancy age 32, menopause age 60, no HRT   Social Determinants of Radio broadcast assistant Strain: Low Risk    Difficulty of Paying Living Expenses: Not hard at all  Food Insecurity: No Food Insecurity   Worried About Charity fundraiser in the Last Year: Never true   Ran Out of Food in the Last Year: Never true  Transportation Needs: No Transportation Needs   Lack of Transportation (Medical): No   Lack of Transportation (Non-Medical): No  Physical Activity: Inactive   Days of  Exercise per Week: 0 days   Minutes of Exercise per Session: 0 min  Stress: No Stress Concern Present   Feeling of Stress : Not at all  Social Connections: Socially Integrated   Frequency of Communication with Friends and Family: Three times a week   Frequency of Social Gatherings with Friends and Family: Once a week   Attends Religious Services: More than 4 times per year   Active Member of [employer] or Organizations: Yes   Attends Archivist Meetings: 1 to 4 times per year   Marital Status: Married   Family History  Problem Relation Age of Onset   Heart disease Father    Cancer Father        lung-nonsmoker   Breast cancer Sister 28   Colon cancer Sister 53   Epilepsy Mother        at older age   Dementia Mother        ?uncertain type   Hip fracture Mother        x2   Other Brother        suicide   Other Maternal Grandfather        didn't go to doctor   Esophageal cancer Neg Hx    Stomach cancer Neg Hx    Allergies  Allergen Reactions   Hydrocodone-Acetaminophen Other (See Comments)    "Does not relieve pain", patient reported.  Can take Tylenol.   Tramadol Other (See Comments)    "Does not help pain."- patient reported.   Boniva [Ibandronic Acid] Nausea Only   Lipitor [Atorvastatin] Other (See Comments)    Muscle and joint pain(s)   Pt request removal of this intolerance   Prior to Admission medications   Medication Sig Start Date End Date Taking? Authorizing Provider  docusate sodium (COLACE) 100 MG capsule Take 1 capsule (100 mg total) by mouth daily as needed. 10/09/20 10/09/21  Aundra Dubin, PA-C  methocarbamol (ROBAXIN) 500 MG tablet Take 1 tablet (500 mg total) by mouth 2 (two) times daily as needed. 10/09/20   Aundra Dubin, PA-C  ondansetron (ZOFRAN) 4 MG tablet Take 1 tablet (4 mg total) by mouth every 8 (eight) hours as needed for nausea or vomiting. 10/09/20   Aundra Dubin, PA-C  oxyCODONE-acetaminophen (PERCOCET) 5-325 MG tablet Take 1-2  tablets by mouth every 6 (six) hours as needed. 10/09/20   Aundra Dubin, PA-C  apixaban (ELIQUIS) 5 MG TABS tablet Take 1 tablet (5 mg total) by mouth 2 (two) times daily. 09/24/20   Camnitz, Ocie Doyne, MD  Biotin 5000 MCG  TABS Take 5,000 mcg by mouth daily.    [provider]  buPROPion (WELLBUTRIN XL) 150 MG 24 hr tablet Take 1 tablet (150 mg total) by mouth daily. 05/08/20   [name], MD  Calcium-Magnesium-Zinc (CAL-MAG-ZINC PO) Take 1 tablet by mouth daily.    [provider]  Cholecalciferol (VITAMIN D) 50 MCG (2000 UT) tablet Take 2,000 Units by mouth daily.    [provider]  citalopram (CELEXA) 40 MG tablet Take 1 tablet (40 mg total) by mouth daily. 10/07/20   [name], MD  denosumab (PROLIA) 60 MG/ML SOSY injection Inject 60 mg into the skin every 6 (six) months.    [provider]  diphenhydrAMINE (BENADRYL) 25 MG tablet Take 25 mg by mouth every 6 (six) hours as needed for allergies.    [provider]  famotidine (PEPCID) 40 MG tablet TAKE 1 TABLET BY MOUTH EVERY DAY Patient taking differently: Take 40 mg by mouth daily. 09/13/20   [name], [name], MD  fluticasone (FLONASE) 50 MCG/ACT nasal spray Place 2 sprays into both nostrils daily. 05/08/20   [name], MD  Omega-3 Fatty Acids (FISH OIL) 1200 MG CAPS Take 1,200 mg by mouth daily.    [provider]  Probiotic Product (PROBIOTIC DAILY PO) Take 1 tablet by mouth daily.    [provider]  SUMAtriptan (IMITREX) 100 MG tablet TAKE 1 TABLET BY MOUTH ONCE FOR 1 DOSE. MAY REPEAT IN 2 HOURS IF HEADACHE PERSISTS / RECURS Patient taking differently: Take 100 mg by mouth every 2 (two) hours as needed for migraine. 10/02/18   Plotnikov, Evie Lacks, MD  VALERIAN ROOT PO Take 1,200 mg by mouth at bedtime as needed (sleep).    [provider]  vitamin B-12 (CYANOCOBALAMIN) 1000 MCG tablet Take 1,000 mcg by mouth daily.    [provider]  zolpidem (AMBIEN) 10 MG tablet TAKE 0.5-1 TABLETS (5-10 MG TOTAL) BY MOUTH AT BEDTIME AS NEEDED. FOR SLEEP Patient taking differently: Take 5 mg by mouth at bedtime as needed for sleep. 05/06/20   [name], MD     Positive ROS: All other systems have been reviewed and were otherwise negative with the exception of those mentioned in the HPI and as above.  Physical Exam: General: Alert, no acute distress Cardiovascular: No pedal edema Respiratory: No cyanosis, no use of accessory musculature GI: abdomen soft Skin: No lesions in the area of chief complaint Neurologic: Sensation intact distally Psychiatric: Patient is competent for consent with normal mood and affect Lymphatic: no lymphedema  MUSCULOSKELETAL: exam stable  Assessment: left knee degenerative joing disease  Plan: Plan for Procedure(s): TOTAL KNEE ARTHROPLASTY  The risks benefits and alternatives were discussed with the patient including but not limited to the risks of nonoperative treatment, versus surgical intervention including infection, bleeding, nerve injury,  blood clots, cardiopulmonary complications, morbidity, mortality, among others, and they were willing to proceed.   Preoperative templating of the joint replacement has been completed, documented, and submitted to the Operating Room personnel in order to optimize intra-operative equipment management.   Eduard Roux, MD 10/11/2020 10:03 AM

## 2020-10-12 LAB — CBC
HCT: 33.2 % — ABNORMAL LOW (ref 36.0–46.0)
Hemoglobin: 11 g/dL — ABNORMAL LOW (ref 12.0–15.0)
MCH: 32.4 pg (ref 26.0–34.0)
MCHC: 33.1 g/dL (ref 30.0–36.0)
MCV: 97.6 fL (ref 80.0–100.0)
Platelets: 208 10*3/uL (ref 150–400)
RBC: 3.4 MIL/uL — ABNORMAL LOW (ref 3.87–5.11)
RDW: 12.5 % (ref 11.5–15.5)
WBC: 8.8 10*3/uL (ref 4.0–10.5)
nRBC: 0 % (ref 0.0–0.2)

## 2020-10-12 LAB — BASIC METABOLIC PANEL
Anion gap: 5 (ref 5–15)
BUN: 15 mg/dL (ref 8–23)
CO2: 28 mmol/L (ref 22–32)
Calcium: 8.3 mg/dL — ABNORMAL LOW (ref 8.9–10.3)
Chloride: 103 mmol/L (ref 98–111)
Creatinine, Ser: 0.8 mg/dL (ref 0.44–1.00)
GFR, Estimated: >60 mL/min (ref >60)
Glucose, Bld: 102 mg/dL — ABNORMAL HIGH (ref 70–99)
Potassium: 4.1 mmol/L (ref 3.5–5.1)
Sodium: 136 mmol/L (ref 135–145)

## 2020-10-12 LAB — SARS CORONAVIRUS 2 (TAT 6-24 HRS): SARS Coronavirus 2: NEGATIVE

## 2020-10-12 MED ORDER — CITALOPRAM HYDROBROMIDE 20 MG PO TABS: 20.0000 mg | ORAL_TABLET | Freq: Every day | ORAL | Status: DC

## 2020-10-12 MED ORDER — HYDROMORPHONE HCL 2 MG PO TABS
2.0000 mg | ORAL_TABLET | ORAL | 0 refills | Status: DC | PRN
Start: 2020-10-12 — End: 2021-02-10

## 2020-10-12 NOTE — Progress Notes (Signed)
   Subjective:  Patient reports pain as mild.  Back pain much better after IV toradol.  Oxycodone not working very well, only makes her a little loopy.  Objective:   VITALS:   Vitals:   10/12/20 0600 10/12/20 0800 10/12/20 0803 10/12/20 1302  BP: (!) 108/41 (!) 108/49 101/60 (!) 112/39  Pulse: 85 (!) 58 87 87  Resp:    17  Temp: 97.8 F (36.6 C) 98.5 F (36.9 C)  98.3 F (36.8 C)  TempSrc: Oral Oral  Oral  SpO2: 100% 98% 96% 99%  Weight:      Height:        Neurologically intact Neurovascular intact Sensation intact distally Intact pulses distally Dorsiflexion/Plantar flexion intact   Lab Results  Component Value Date   WBC 8.8 10/12/2020   HGB 11.0 (L) 10/12/2020   HCT 33.2 (L) 10/12/2020   MCV 97.6 10/12/2020   PLT 208 10/12/2020     Assessment/Plan:  1 Day Post-Op   - Expected postop acute blood loss anemia - will monitor for symptoms - she did very well with PT/OT - DVT ppx - SCDs, ambulation, aspirin - WBAT operative extremity - Pain control - Discharge home today  Brittany [name] 10/12/2020, 2:13 PM [phone]

## 2020-10-12 NOTE — Progress Notes (Signed)
Occupational Therapy Evaluation Patient Details Name: Brittany [name] MRN: [id number] DOB: Jul 26, 1945 Today's Date: 10/12/2020    History of Present Illness Pt is 75 yo female s/p L TKA on 10/11/20. She has hx including but not limited to afib, anxiety, arthritis, breast CA, and R TKA 2017   Clinical Impression   Brittany [name] was evaluated s/p the above L TKA. PTA pt was indep in all ADL/IADLs including driving. She lives in a 1 level home with 2 STE, with her husband who plans to assist at d/c. Upon evaluation pt is supervision/min guard for room distance ambulation with RW, and set up- MinA for BADLs. Pt has great safety awareness, and states she plans to sleep in her recliner initially. Pt does not require skilled OT acutely. Recommend d/c home with supervision initially for all ADLs and mobility.     Follow Up Recommendations  No OT follow up;Supervision - Intermittent    Equipment Recommendations  None recommended by OT       Precautions / Restrictions Precautions Precautions: Fall Restrictions Weight Bearing Restrictions: Yes LLE Weight Bearing: Weight bearing as tolerated      Mobility Bed Mobility Overal bed mobility: Needs Assistance Bed Mobility: Supine to Sit     Supine to sit: Supervision     General bed mobility comments: in chair upon arrival, returned to chair    Transfers Overall transfer level: Needs assistance Equipment used: Rolling walker (2 wheeled) Transfers: Sit to/from Stand Sit to Stand: Supervision         General transfer comment: cues for safe hand placement and L LE management.  Pt attempted to pull into standing holding RW and required cues to reach back for seated surface.    Balance Overall balance assessment: Needs assistance Sitting-balance support: No upper extremity supported Sitting balance-Leahy Scale: Good       Standing balance-Leahy Scale: Fair Standing balance comment: Could static stand without AD but utilized RW for  ambulation             ADL either performed or assessed with clinical judgement   ADL Overall ADL's : Needs assistance/impaired Eating/Feeding: Independent;Sitting   Grooming: Set up;Sitting   Upper Body Bathing: Set up;Sitting   Lower Body Bathing: Minimal assistance;Sit to/from stand   Upper Body Dressing : Set up;Sitting   Lower Body Dressing: Minimal assistance;Sit to/from stand   Toilet Transfer: Min guard;Ambulation;RW   Toileting- Clothing Manipulation and Hygiene: Supervision/safety;Sitting/lateral lean       Functional mobility during ADLs: Min guard;Rolling walker General ADL Comments: no physical assist needed for ambulation or balance, assist for threading LB clothing     Vision Baseline Vision/History: Wears glasses Wears Glasses: At all times Vision Assessment?: No apparent visual deficits            Pertinent Vitals/Pain Pain Assessment: Faces Pain Score: 5  Faces Pain Scale: Hurts a little bit Pain Location: L knee Pain Descriptors / Indicators: Sore;Discomfort;Aching Pain Intervention(s): Monitored during session     Hand Dominance     Extremity/Trunk Assessment Upper Extremity Assessment Upper Extremity Assessment: Overall WFL for tasks assessed   Lower Extremity Assessment Lower Extremity Assessment: Defer to PT evaluation   Cervical / Trunk Assessment Cervical / Trunk Assessment: Normal   Communication Communication Communication: No difficulties   Cognition Arousal/Alertness: Awake/alert Behavior During Therapy: WFL for tasks assessed/performed Overall Cognitive Status: Within Functional Limits for tasks assessed           General Comments: Very alert and oriented, good  safety awareness   General Comments  VSS on RA, pt eager to go home, great safety awareness    Exercises Total Joint Exercises Ankle Circles/Pumps: AROM;Both;20 reps;Supine Quad Sets: AROM;Left;10 reps;Supine Heel Slides: AAROM;Left;10 reps;Supine Hip  ABduction/ADduction: Left;10 reps;Supine;AROM Straight Leg Raises: Left;10 reps;Supine;AROM Goniometric ROM: 10-93 L knee AROM        Home Living Family/patient expects to be discharged to:: Private residence Living Arrangements: Spouse/significant other Available Help at Discharge: Family;Available 24 hours/day Type of Home: House Home Access: Stairs to enter [employer] of Steps: 2 Entrance Stairs-Rails: None Home Layout: One level     Bathroom Shower/Tub: Producer, television/film/video: Handicapped height Bathroom Accessibility: Yes   Home Equipment: Environmental consultant - 2 wheels;Bedside commode;Shower seat;Grab bars - tub/shower          Prior Functioning/Environment Level of Independence: Independent        Comments: Completely independent and can ambulate in community        OT Problem List: Decreased activity tolerance;Impaired balance (sitting and/or standing);Pain;Decreased knowledge of use of DME or AE      OT Treatment/Interventions:      OT Goals(Current goals can be found in the care plan section) Acute Rehab OT Goals Patient Stated Goal: return home OT Goal Formulation: All assessment and education complete, DC therapy   AM-PAC OT "6 Clicks" Daily Activity     Outcome Measure Help from another person eating meals?: None Help from another person taking care of personal grooming?: A Little Help from another person toileting, which includes using toliet, bedpan, or urinal?: A Little Help from another person bathing (including washing, rinsing, drying)?: A Little Help from another person to put on and taking off regular upper body clothing?: None Help from another person to put on and taking off regular lower body clothing?: A Little 6 Click Score: 20   End of Session Equipment Utilized During Treatment: Rolling walker Nurse Communication: Mobility status;Precautions;Weight bearing status  Activity Tolerance: Patient tolerated treatment  well Patient left: in chair;with call bell/phone within reach  OT Visit Diagnosis: Other abnormalities of gait and mobility (R26.89);Pain                Time: 2956-2130 OT Time Calculation (min): 22 min Charges:  OT General Charges $OT Visit: 1 Visit OT Evaluation $OT Eval Moderate Complexity: 1 Mod    Burdette Forehand A Kadi Hession 10/12/2020, 2:01 PM

## 2020-10-12 NOTE — Progress Notes (Signed)
Physical Therapy Treatment Patient Details Name: Brittany [name] MRN: [id number] DOB: 1945/11/21 Today's Date: 10/12/2020    History of Present Illness Pt is 75 yo female s/p L TKA on 10/11/20. She has hx including but not limited to afib, anxiety, arthritis, breast CA, and R TKA 2017    PT Comments    Pt supine in bed on arrival this session.  Pt is eager to mobilize this session.  Pt continues to improve and eager to return home.  Performed stair training, LLE exercises and issued HEP for home use.  Pt issued gt belt to assist in knee flexion exercises.  Pt is ready to d/c home from a PT stand point.       Follow Up Recommendations  Follow surgeon's recommendation for DC plan and follow-up therapies;Supervision for mobility/OOB     Equipment Recommendations  None recommended by PT (has DME)    Recommendations for Other Services       Precautions / Restrictions Precautions Precautions: Fall Restrictions Weight Bearing Restrictions: Yes LLE Weight Bearing: Weight bearing as tolerated    Mobility  Bed Mobility Overal bed mobility: Needs Assistance Bed Mobility: Supine to Sit     Supine to sit: Supervision     General bed mobility comments: Increased time and effort to move to edge of bed.    Transfers Overall transfer level: Needs assistance Equipment used: Rolling walker (2 wheeled) Transfers: Sit to/from Stand Sit to Stand: Supervision         General transfer comment: cues for safe hand placement and L LE management.  Pt attempted to pull into standing holding RW and required cues to reach back for seated surface.  Ambulation/Gait Ambulation/Gait assistance: Min guard;Supervision Gait Distance (Feet): 200 Feet Assistive device: Rolling walker (2 wheeled) Gait Pattern/deviations: Step-to pattern;Decreased stride length;Decreased stance time - left Gait velocity: decreased   General Gait Details: Min decrease in stance time on L; min cues for RW use; tolerated  well.  Cues for L heel strike, Pt lacks extension in L knee which carries over into gt training.   Stairs Stairs: Yes Stairs assistance: Min assist Stair Management: No rails;Step to pattern;Backwards;Forwards Number of Stairs: 2 General stair comments: Cues for sequencing and RW placement.  Forward to descend and backward to ascend.   Wheelchair Mobility    Modified Rankin (Stroke Patients Only)       Balance Overall balance assessment: Needs assistance Sitting-balance support: No upper extremity supported Sitting balance-Leahy Scale: Good       Standing balance-Leahy Scale: Fair Standing balance comment: Could static stand without AD but utilized RW for ambulation                            Cognition Arousal/Alertness: Awake/alert Behavior During Therapy: WFL for tasks assessed/performed Overall Cognitive Status: Within Functional Limits for tasks assessed                                 General Comments: Very alert and oriented, good safety awareness      Exercises Total Joint Exercises Ankle Circles/Pumps: AROM;Both;20 reps;Supine Quad Sets: AROM;Left;10 reps;Supine Heel Slides: AAROM;Left;10 reps;Supine Hip ABduction/ADduction: Left;10 reps;Supine;AROM Straight Leg Raises: Left;10 reps;Supine;AROM Goniometric ROM: 10-93 L knee AROM    General Comments        Pertinent Vitals/Pain Pain Assessment: 0-10 Pain Score: 5  Pain Descriptors / Indicators: Sore;Discomfort;Aching Pain Intervention(s):  Monitored during session;Repositioned    Home Living                      Prior Function            PT Goals (current goals can now be found in the care plan section) Acute Rehab PT Goals Patient Stated Goal: return home Potential to Achieve Goals: Good Progress towards PT goals: Progressing toward goals    Frequency    7X/week      PT Plan Current plan remains appropriate    Co-evaluation               AM-PAC PT "6 Clicks" Mobility   Outcome Measure  Help needed turning from your back to your side while in a flat bed without using bedrails?: None Help needed moving from lying on your back to sitting on the side of a flat bed without using bedrails?: None Help needed moving to and from a bed to a chair (including a wheelchair)?: A Little Help needed standing up from a chair using your arms (e.g., wheelchair or bedside chair)?: A Little Help needed to walk in hospital room?: A Little Help needed climbing 3-5 steps with a railing? : A Little 6 Click Score: 20    End of Session Equipment Utilized During Treatment: Gait belt Activity Tolerance: Patient tolerated treatment well Patient left: in chair;with call bell/phone within reach;with family/visitor present Nurse Communication: Mobility status PT Visit Diagnosis: Other abnormalities of gait and mobility (R26.89);Muscle weakness (generalized) (M62.81)     Time: 1610-9604 PT Time Calculation (min) (ACUTE ONLY): 31 min  Charges:  $Gait Training: 8-22 mins $Therapeutic Exercise: 8-22 mins                     Bonney Leitz , PTA Acute Rehabilitation Services Pager [phone] Office [phone]    Brittany [name] 10/12/2020, 12:30 PM

## 2020-10-12 NOTE — Discharge Summary (Signed)
Patient ID: Brittany [name] MRN: [id number] DOB/AGE: 1945-07-10 75 y.o.  Admit date: 10/11/2020 Discharge date: 10/12/2020  Admission Diagnoses:  Primary osteoarthritis of left knee  Discharge Diagnoses:  Principal Problem:   Primary osteoarthritis of left knee Active Problems:   Status post total left knee replacement   Past Medical History:  Diagnosis Date   A-fib ([place])    Acid reflux    Acute cystitis with positive culture 03/06/2014   Anemia    Anxiety    Arthritis    Baker cyst, left 04/06/2016   Aspirated 04/06/2016   Breast cancer ([place]) 11/23/2007   right, ER/PR -, HER 2 -   Cognitive deficits 09/25/2014   2016 Very mild, could be related to meds    Degenerative arthritis of left knee 04/06/2016   Injection and aspiration 04/06/2016   Degenerative arthritis of right knee 09/27/2015   Depression    Esophageal stricture    Gastritis 10/2007   History of chemotherapy    neoadjuvant chemo, last dose 03/2008   History of radiation therapy 07/25/08 -09/11/08   right breast   Hx of colonic polyps 08/11/2016   Hyperlipidemia    Migraines    rare   Myalgia 07/18/2013   5/15 multiple site    OSA (obstructive sleep apnea)    severe OSA AHI 34, intolerant to PAP and now s/p hypoglossal nerve stimulator implant   Osteopenia    Rupture of quadriceps tendon 03/23/2017    Surgeries: Procedure(s): TOTAL KNEE ARTHROPLASTY on 10/11/2020   Consultants (if any):   Discharged Condition: Improved  Hospital Course: Brittany [name] is an 75 y.o. female who was admitted 10/11/2020 with a diagnosis of Primary osteoarthritis of left knee and went to the operating room on 10/11/2020 and underwent the above named procedures.    She was given perioperative antibiotics:  Anti-infectives (From admission, onward)    Start     Dose/Rate Route Frequency Ordered Stop   10/11/20 2000  ceFAZolin (ANCEF) IVPB 2g/100 mL premix        2 g 200 mL/hr over 30 Minutes Intravenous Every 8 hours  10/11/20 1710 10/12/20 0439   10/11/20 1357  vancomycin (VANCOCIN) powder  Status:  Discontinued          As needed 10/11/20 1359 10/11/20 1448   10/11/20 1015  ceFAZolin (ANCEF) IVPB 2g/100 mL premix        2 g 200 mL/hr over 30 Minutes Intravenous On call to O.R. 10/11/20 1012 10/11/20 1245     .  She was given sequential compression devices, early ambulation, and appropriate chemoprophylaxis for DVT prophylaxis.  She benefited maximally from the hospital stay and there were no complications.    Recent vital signs:  Vitals:   10/12/20 0803 10/12/20 1302  BP: 101/60 (!) 112/39  Pulse: 87 87  Resp:  17  Temp:  98.3 F (36.8 C)  SpO2: 96% 99%    Recent laboratory studies:  Lab Results  Component Value Date   HGB 11.0 (L) 10/12/2020   HGB 13.3 09/25/2020   HGB 12.7 05/15/2020   Lab Results  Component Value Date   WBC 8.8 10/12/2020   PLT 208 10/12/2020   Lab Results  Component Value Date   INR 1.0 09/25/2020   Lab Results  Component Value Date   NA 136 10/12/2020   K 4.1 10/12/2020   CL 103 10/12/2020   CO2 28 10/12/2020   BUN 15 10/12/2020   CREATININE 0.80 10/12/2020  GLUCOSE 102 (H) 10/12/2020    Discharge Medications:   Allergies as of 10/12/2020       Reactions   Boniva [ibandronic Acid] Nausea Only   Hydrocodone-acetaminophen Other (See Comments)   "Does not relieve pain", patient reported.  Can take Tylenol.   Lipitor [atorvastatin] Other (See Comments)   Muscle and joint pain(s)   Pt request removal of this intolerance   Tramadol Other (See Comments)   "Does not help pain."- patient reported.        Medication List     TAKE these medications    apixaban 5 MG Tabs tablet Commonly known as: Eliquis Take 1 tablet (5 mg total) by mouth 2 (two) times daily.   Biotin 5000 MCG Tabs Take 5,000 mcg by mouth daily.   buPROPion 150 MG 24 hr tablet Commonly known as: Wellbutrin XL Take 1 tablet (150 mg total) by mouth daily.   CAL-MAG-ZINC  PO Take 1 tablet by mouth daily.   citalopram 40 MG tablet Commonly known as: CELEXA Take 1 tablet (40 mg total) by mouth daily.   denosumab 60 MG/ML Sosy injection Commonly known as: PROLIA Inject 60 mg into the skin every 6 (six) months.   diphenhydrAMINE 25 MG tablet Commonly known as: BENADRYL Take 25 mg by mouth every 6 (six) hours as needed for allergies.   docusate sodium 100 MG capsule Commonly known as: Colace Take 1 capsule (100 mg total) by mouth daily as needed.   famotidine 40 MG tablet Commonly known as: PEPCID TAKE 1 TABLET BY MOUTH EVERY DAY   Fish Oil 1200 MG Caps Take 1,200 mg by mouth daily.   fluticasone 50 MCG/ACT nasal spray Commonly known as: FLONASE Place 2 sprays into both nostrils daily.   HYDROmorphone 2 MG tablet Commonly known as: Dilaudid Take 1 tablet (2 mg total) by mouth every 4 (four) hours as needed for severe pain.   methocarbamol 500 MG tablet Commonly known as: Robaxin Take 1 tablet (500 mg total) by mouth 2 (two) times daily as needed.   ondansetron 4 MG tablet Commonly known as: Zofran Take 1 tablet (4 mg total) by mouth every 8 (eight) hours as needed for nausea or vomiting.   oxyCODONE-acetaminophen 5-325 MG tablet Commonly known as: Percocet Take 1-2 tablets by mouth every 6 (six) hours as needed.   PROBIOTIC DAILY PO Take 1 tablet by mouth daily.   SUMAtriptan 100 MG tablet Commonly known as: IMITREX TAKE 1 TABLET BY MOUTH ONCE FOR 1 DOSE. MAY REPEAT IN 2 HOURS IF HEADACHE PERSISTS / RECURS What changed: See the new instructions.   VALERIAN ROOT PO Take 1,200 mg by mouth at bedtime as needed (sleep).   vitamin B-12 1000 MCG tablet Commonly known as: CYANOCOBALAMIN Take 1,000 mcg by mouth daily.   Vitamin D 50 MCG (2000 UT) tablet Take 2,000 Units by mouth daily.       ASK your doctor about these medications    zolpidem 10 MG tablet Commonly known as: AMBIEN TAKE 0.5-1 TABLETS (5-10 MG TOTAL) BY MOUTH AT  BEDTIME AS NEEDED. FOR SLEEP               Durable Medical Equipment  (From admission, onward)           Start     Ordered   10/11/20 1710  DME Walker rolling  Once       Question Answer Comment  Walker: With 5 Inch Wheels   Patient needs a walker to  treat with the following condition Status post left partial knee replacement      10/11/20 1710   10/11/20 1710  DME 3 n 1  Once        10/11/20 1710   10/11/20 1710  DME Bedside commode  Once       Question:  Patient needs a bedside commode to treat with the following condition  Answer:  Status post left partial knee replacement   10/11/20 1710            Diagnostic Studies: DG Knee Left Port  Result Date: 10/11/2020 CLINICAL DATA:  Postop. EXAM: PORTABLE LEFT KNEE - 1-2 VIEW COMPARISON:  Preoperative radiograph 06/11/2020 FINDINGS: Left knee arthroplasty in expected alignment. No periprosthetic lucency or fracture. There has been patellar resurfacing. Recent postsurgical change includes air and edema in the soft tissues and joint space. Overlying brace in place causing external artifact. IMPRESSION: Left knee arthroplasty without immediate postoperative complication. Electronically Signed   By: Keith Rake M.D.   On: 10/11/2020 15:32    Disposition: Discharge disposition: 01-Home or Self Care       Discharge Instructions     Call MD / Call 911   Complete by: As directed    If you experience chest pain or shortness of breath, CALL 911 and be transported to the hospital emergency room.  If you develope a fever above 101.5 F, pus (white drainage) or increased drainage or redness at the wound, or calf pain, call your surgeon's office.   Constipation Prevention   Complete by: As directed    Drink plenty of fluids.  Prune juice may be helpful.  You may use a stool softener, such as Colace (over the counter) 100 mg twice a day.  Use MiraLax (over the counter) for constipation as needed.   Driving restrictions    Complete by: As directed    No driving while taking narcotic pain meds.   Increase activity slowly as tolerated   Complete by: As directed    Post-operative opioid taper instructions:   Complete by: As directed    POST-OPERATIVE OPIOID TAPER INSTRUCTIONS: It is important to wean off of your opioid medication as soon as possible. If you do not need pain medication after your surgery it is ok to stop day one. Opioids include: Codeine, Hydrocodone(Norco, Vicodin), Oxycodone(Percocet, oxycontin) and hydromorphone amongst others.  Long term and even short term use of opiods can cause: Increased pain response Dependence Constipation Depression Respiratory depression And more.  Withdrawal symptoms can include Flu like symptoms Nausea, vomiting And more Techniques to manage these symptoms Hydrate well Eat regular healthy meals Stay active Use relaxation techniques(deep breathing, meditating, yoga) Do Not substitute Alcohol to help with tapering If you have been on opioids for less than two weeks and do not have pain than it is ok to stop all together.  Plan to wean off of opioids This plan should start within one week post op of your joint replacement. Maintain the same interval or time between taking each dose and first decrease the dose.  Cut the total daily intake of opioids by one tablet each day Next start to increase the time between doses. The last dose that should be eliminated is the evening dose.           Follow-up Information     [name], MD. Go on 10/25/2020.   Specialty: Orthopedic Surgery Why: At 9:30 am in Dr. Phoebe Sharps office For suture removal, For wound re-check  Contact information: [address] [place] [address] [phone]         [facility]. Go on 10/25/2020.   Specialty: Rehabilitation Why: at 10:15 am for your first Outpatient Physical Therapy appointment with our office. You will see Dr. Erlinda Hong first, then come directly  upstairs to check in for physical therapy. Contact information: [address] [place] [phone]                 Signed: Eduard Roux 10/12/2020, 2:14 PM

## 2020-10-13 ENCOUNTER — Encounter: Payer: Self-pay | Admitting: Surgical

## 2020-10-13 DIAGNOSIS — I4891 Unspecified atrial fibrillation: Secondary | ICD-10-CM | POA: Diagnosis not present

## 2020-10-13 DIAGNOSIS — F32A Depression, unspecified: Secondary | ICD-10-CM | POA: Diagnosis not present

## 2020-10-13 DIAGNOSIS — M7120 Synovial cyst of popliteal space [Baker], unspecified knee: Secondary | ICD-10-CM | POA: Diagnosis not present

## 2020-10-13 DIAGNOSIS — E785 Hyperlipidemia, unspecified: Secondary | ICD-10-CM | POA: Diagnosis not present

## 2020-10-13 DIAGNOSIS — J3089 Other allergic rhinitis: Secondary | ICD-10-CM | POA: Diagnosis not present

## 2020-10-13 DIAGNOSIS — E538 Deficiency of other specified B group vitamins: Secondary | ICD-10-CM | POA: Diagnosis not present

## 2020-10-13 DIAGNOSIS — F419 Anxiety disorder, unspecified: Secondary | ICD-10-CM | POA: Diagnosis not present

## 2020-10-13 DIAGNOSIS — I051 Rheumatic mitral insufficiency: Secondary | ICD-10-CM | POA: Diagnosis not present

## 2020-10-13 DIAGNOSIS — I8390 Asymptomatic varicose veins of unspecified lower extremity: Secondary | ICD-10-CM | POA: Diagnosis not present

## 2020-10-13 DIAGNOSIS — R3915 Urgency of urination: Secondary | ICD-10-CM | POA: Diagnosis not present

## 2020-10-13 DIAGNOSIS — K219 Gastro-esophageal reflux disease without esophagitis: Secondary | ICD-10-CM | POA: Diagnosis not present

## 2020-10-13 DIAGNOSIS — Z96652 Presence of left artificial knee joint: Secondary | ICD-10-CM | POA: Diagnosis not present

## 2020-10-13 DIAGNOSIS — D509 Iron deficiency anemia, unspecified: Secondary | ICD-10-CM | POA: Diagnosis not present

## 2020-10-13 DIAGNOSIS — M810 Age-related osteoporosis without current pathological fracture: Secondary | ICD-10-CM | POA: Diagnosis not present

## 2020-10-13 DIAGNOSIS — Z471 Aftercare following joint replacement surgery: Secondary | ICD-10-CM | POA: Diagnosis not present

## 2020-10-13 DIAGNOSIS — E041 Nontoxic single thyroid nodule: Secondary | ICD-10-CM | POA: Diagnosis not present

## 2020-10-13 DIAGNOSIS — R350 Frequency of micturition: Secondary | ICD-10-CM | POA: Diagnosis not present

## 2020-10-13 DIAGNOSIS — G4733 Obstructive sleep apnea (adult) (pediatric): Secondary | ICD-10-CM | POA: Diagnosis not present

## 2020-10-13 DIAGNOSIS — K449 Diaphragmatic hernia without obstruction or gangrene: Secondary | ICD-10-CM | POA: Diagnosis not present

## 2020-10-13 DIAGNOSIS — G4700 Insomnia, unspecified: Secondary | ICD-10-CM | POA: Diagnosis not present

## 2020-10-13 DIAGNOSIS — I058 Other rheumatic mitral valve diseases: Secondary | ICD-10-CM | POA: Diagnosis not present

## 2020-10-13 DIAGNOSIS — G43909 Migraine, unspecified, not intractable, without status migrainosus: Secondary | ICD-10-CM | POA: Diagnosis not present

## 2020-10-13 DIAGNOSIS — J302 Other seasonal allergic rhinitis: Secondary | ICD-10-CM | POA: Diagnosis not present

## 2020-10-13 DIAGNOSIS — B351 Tinea unguium: Secondary | ICD-10-CM | POA: Diagnosis not present

## 2020-10-14 ENCOUNTER — Telehealth: Payer: Self-pay | Admitting: *Deleted

## 2020-10-14 NOTE — Telephone Encounter (Signed)
Ortho bundle D/C call completed. 

## 2020-10-16 ENCOUNTER — Encounter (HOSPITAL_COMMUNITY): Payer: Self-pay | Admitting: Orthopaedic Surgery

## 2020-10-17 ENCOUNTER — Encounter (HOSPITAL_BASED_OUTPATIENT_CLINIC_OR_DEPARTMENT_OTHER): Payer: PPO | Admitting: Cardiology

## 2020-10-17 DIAGNOSIS — E538 Deficiency of other specified B group vitamins: Secondary | ICD-10-CM | POA: Diagnosis not present

## 2020-10-17 DIAGNOSIS — Z471 Aftercare following joint replacement surgery: Secondary | ICD-10-CM | POA: Diagnosis not present

## 2020-10-17 DIAGNOSIS — E041 Nontoxic single thyroid nodule: Secondary | ICD-10-CM | POA: Diagnosis not present

## 2020-10-17 DIAGNOSIS — I051 Rheumatic mitral insufficiency: Secondary | ICD-10-CM | POA: Diagnosis not present

## 2020-10-17 DIAGNOSIS — G4733 Obstructive sleep apnea (adult) (pediatric): Secondary | ICD-10-CM | POA: Diagnosis not present

## 2020-10-17 DIAGNOSIS — F419 Anxiety disorder, unspecified: Secondary | ICD-10-CM | POA: Diagnosis not present

## 2020-10-17 DIAGNOSIS — J3089 Other allergic rhinitis: Secondary | ICD-10-CM | POA: Diagnosis not present

## 2020-10-17 DIAGNOSIS — F32A Depression, unspecified: Secondary | ICD-10-CM | POA: Diagnosis not present

## 2020-10-17 DIAGNOSIS — B351 Tinea unguium: Secondary | ICD-10-CM | POA: Diagnosis not present

## 2020-10-17 DIAGNOSIS — I4891 Unspecified atrial fibrillation: Secondary | ICD-10-CM | POA: Diagnosis not present

## 2020-10-17 DIAGNOSIS — G4700 Insomnia, unspecified: Secondary | ICD-10-CM | POA: Diagnosis not present

## 2020-10-17 DIAGNOSIS — K219 Gastro-esophageal reflux disease without esophagitis: Secondary | ICD-10-CM | POA: Diagnosis not present

## 2020-10-17 DIAGNOSIS — D509 Iron deficiency anemia, unspecified: Secondary | ICD-10-CM | POA: Diagnosis not present

## 2020-10-17 DIAGNOSIS — M810 Age-related osteoporosis without current pathological fracture: Secondary | ICD-10-CM | POA: Diagnosis not present

## 2020-10-17 DIAGNOSIS — I8390 Asymptomatic varicose veins of unspecified lower extremity: Secondary | ICD-10-CM | POA: Diagnosis not present

## 2020-10-17 DIAGNOSIS — E785 Hyperlipidemia, unspecified: Secondary | ICD-10-CM | POA: Diagnosis not present

## 2020-10-17 DIAGNOSIS — G43909 Migraine, unspecified, not intractable, without status migrainosus: Secondary | ICD-10-CM | POA: Diagnosis not present

## 2020-10-17 DIAGNOSIS — J302 Other seasonal allergic rhinitis: Secondary | ICD-10-CM | POA: Diagnosis not present

## 2020-10-17 DIAGNOSIS — I058 Other rheumatic mitral valve diseases: Secondary | ICD-10-CM | POA: Diagnosis not present

## 2020-10-17 DIAGNOSIS — R3915 Urgency of urination: Secondary | ICD-10-CM | POA: Diagnosis not present

## 2020-10-17 DIAGNOSIS — R350 Frequency of micturition: Secondary | ICD-10-CM | POA: Diagnosis not present

## 2020-10-17 DIAGNOSIS — M7120 Synovial cyst of popliteal space [Baker], unspecified knee: Secondary | ICD-10-CM | POA: Diagnosis not present

## 2020-10-17 DIAGNOSIS — K449 Diaphragmatic hernia without obstruction or gangrene: Secondary | ICD-10-CM | POA: Diagnosis not present

## 2020-10-17 DIAGNOSIS — Z96652 Presence of left artificial knee joint: Secondary | ICD-10-CM | POA: Diagnosis not present

## 2020-10-25 ENCOUNTER — Encounter: Payer: Self-pay | Admitting: Rehabilitative and Restorative Service Providers"

## 2020-10-25 ENCOUNTER — Telehealth: Payer: Self-pay | Admitting: *Deleted

## 2020-10-25 ENCOUNTER — Other Ambulatory Visit: Payer: Self-pay

## 2020-10-25 ENCOUNTER — Ambulatory Visit (INDEPENDENT_AMBULATORY_CARE_PROVIDER_SITE_OTHER): Payer: PPO | Admitting: Physician Assistant

## 2020-10-25 ENCOUNTER — Ambulatory Visit: Payer: PPO | Admitting: Rehabilitative and Restorative Service Providers"

## 2020-10-25 ENCOUNTER — Encounter: Payer: Self-pay | Admitting: Orthopaedic Surgery

## 2020-10-25 DIAGNOSIS — M25562 Pain in left knee: Secondary | ICD-10-CM | POA: Diagnosis not present

## 2020-10-25 DIAGNOSIS — Z96652 Presence of left artificial knee joint: Secondary | ICD-10-CM

## 2020-10-25 DIAGNOSIS — M6281 Muscle weakness (generalized): Secondary | ICD-10-CM | POA: Diagnosis not present

## 2020-10-25 DIAGNOSIS — R269 Unspecified abnormalities of gait and mobility: Secondary | ICD-10-CM

## 2020-10-25 MED ORDER — METHOCARBAMOL 500 MG PO TABS: 500.0000 mg | ORAL_TABLET | Freq: Two times a day (BID) | ORAL | 2 refills | Status: DC | PRN

## 2020-10-25 NOTE — Telephone Encounter (Signed)
Ortho bundle 14 day office visit completed.

## 2020-10-25 NOTE — Progress Notes (Signed)
Post-Op Visit Note   Patient: Brittany [name]           Date of Birth: 04/06/1945           MRN: [id number] Visit Date: 10/25/2020 PCP: [name], MD   Assessment & Plan:  Chief Complaint:  Chief Complaint  Patient presents with   Left Knee - Pain   Visit Diagnoses:  1. Hx of total knee replacement, left     Plan: Patient is a pleasant 75 year old female who comes in today 2 weeks status post left total knee replacement 10/11/2020.  She has been doing well.  She has had minimal pain.  She is doing well with physical therapy and ambulating with a walker.  Examination of the left knee reveals a well-healing surgical incision with nylon sutures in place.  No evidence of infection or cellulitis.  Calf is soft nontender.  Range of motion 0 to 100 degrees.  She is neurovascular intact distally.  Today, sutures were removed and Steri-Strips applied.  Dental prophylaxis reinforced.  She already has an appointment for outpatient physical therapy this morning.  I refilled her Robaxin.  Follow-up with [place] in 4 weeks time for repeat evaluation and 2 view x-rays of the left knee.  Call with concerns or questions in the meantime.  Follow-Up Instructions: Return in about 4 weeks (around 11/22/2020).   Orders:  No orders of the defined types were placed in this encounter.  Meds ordered this encounter  Medications   methocarbamol (ROBAXIN) 500 MG tablet    Sig: Take 1 tablet (500 mg total) by mouth 2 (two) times daily as needed.    Dispense:  20 tablet    Refill:  2    Imaging: No new imaging  PMFS History: Patient Active Problem List   Diagnosis Date Noted   Status post total left knee replacement 10/11/2020   Primary osteoarthritis of left knee 10/02/2020   Atrial fibrillation ([place]) 05/13/2018   Bloating 04/11/2018   Abnormal EKG 08/23/2017   Malignant neoplasm of overlapping sites of right breast in female, estrogen receptor positive ([place]) 08/17/2016   Hx of colonic polyps  08/11/2016   Iron deficiency anemia 07/28/2016   Blood donor 07/28/2016   Pain due to total right knee replacement ([place]) 04/06/2016   S/P right TKA 12/31/2015   Baker's cyst, unruptured 09/27/2015   Insomnia 07/31/2015   Onychomycosis 09/25/2014   Urinary frequency 05/21/2014   Mitral valve prolapse 05/08/2014   Mitral regurgitation 05/08/2014   Dyslipidemia 02/09/2014   History of radiation therapy    Hiatal hernia    History of chemotherapy    Osteoporosis 03/21/2012   Seasonal and perennial allergic rhinitis 12/17/2011   B12 deficiency 10/09/2009   TOBACCO USE, QUIT 01/31/2009   THYROID NODULE 12/25/2008   GERD 11/23/2007   VARICOSE VEIN 12/29/2006   Anxiety state 12/23/2006   Depression 12/23/2006   Migraine variant 12/23/2006   Disorder of bone and cartilage 12/23/2006   Past Medical History:  Diagnosis Date   A-fib ([place])    Acid reflux    Acute cystitis with positive culture 03/06/2014   Anemia    Anxiety    Arthritis    Baker cyst, left 04/06/2016   Aspirated 04/06/2016   Breast cancer ([place]) 11/23/2007   right, ER/PR -, HER 2 -   Cognitive deficits 09/25/2014   2016 Very mild, could be related to meds    Degenerative arthritis of left knee 04/06/2016   Injection and  aspiration 04/06/2016   Degenerative arthritis of right knee 09/27/2015   Depression    Esophageal stricture    Gastritis 10/2007   History of chemotherapy    neoadjuvant chemo, last dose 03/2008   History of radiation therapy 07/25/08 -09/11/08   right breast   Hx of colonic polyps 08/11/2016   Hyperlipidemia    Migraines    rare   Myalgia 07/18/2013   5/15 multiple site    OSA (obstructive sleep apnea)    severe OSA AHI 34, intolerant to PAP and now s/p hypoglossal nerve stimulator implant   Osteopenia    Rupture of quadriceps tendon 03/23/2017    Family History  Problem Relation Age of Onset   Heart disease Father    Cancer Father        lung-nonsmoker   Breast cancer Sister 75    Colon cancer Sister 39   Epilepsy Mother        at older age   Dementia Mother        ?uncertain type   Hip fracture Mother        x2   Other Brother        suicide   Other Maternal Grandfather        didn't go to doctor   Esophageal cancer Neg Hx    Stomach cancer Neg Hx     Past Surgical History:  Procedure Laterality Date   ATRIAL FIBRILLATION ABLATION N/A 10/07/2018   Procedure: ATRIAL FIBRILLATION ABLATION;  Surgeon: [name], MD;  Location: [facility];  Service: Cardiovascular;  Laterality: N/A;   BREAST LUMPECTOMY  06/26/2008   right, CA   XRT, chemo   COLONOSCOPY     DRUG INDUCED ENDOSCOPY N/A 02/21/2020   Procedure: DRUG INDUCED ENDOSCOPY;  Surgeon: Melida Quitter, MD;  Location: [facility];  Service: ENT;  Laterality: N/A;   EYE SURGERY Bilateral    growth removed per right thigh     as teen - mole   IMPLANTATION OF HYPOGLOSSAL NERVE STIMULATOR N/A 05/15/2020   Procedure: IMPLANTATION OF HYPOGLOSSAL NERVE STIMULATOR;  Surgeon: Melida Quitter, MD;  Location: [facility];  Service: ENT;  Laterality: N/A;   JOINT REPLACEMENT  12/31/2015   RTK   QUADRICEPS TENDON REPAIR Right 09/14/2016   Procedure: OPEN REPAIR QUADRICEP TENDON;  Surgeon: [name], MD;  Location: [facility];  Service: Orthopedics;  Laterality: Right;   TOTAL KNEE ARTHROPLASTY Right 12/31/2015   Procedure: RIGHT TOTAL KNEE ARTHROPLASTY;  Surgeon: [name], MD;  Location: [facility];  Service: Orthopedics;  Laterality: Right;   TOTAL KNEE ARTHROPLASTY Left 10/11/2020   Procedure: TOTAL KNEE ARTHROPLASTY;  Surgeon: [name], MD;  Location: [facility];  Service: Orthopedics;  Laterality: Left;   UPPER GASTROINTESTINAL ENDOSCOPY     Social History   Occupational History    Employer: RETIRED  Tobacco Use   Smoking status: Former    Packs/day: 0.25    Years: 5.00    Pack years: 1.25    Types: Cigarettes    Quit date: 03/03/1967    Years since quitting: 53.6   Smokeless  tobacco: Never   Tobacco comments:    6 cig per day when she was smoking/quit years ago  Vaping Use   Vaping Use: Never used  Substance and Sexual Activity   Alcohol use: Yes    Alcohol/week: 7.0 standard drinks    Types: 7 Cans of beer per week    Comment: 1 beer  per day - 2.4 % alcholol - [employer]   Drug use: No   Sexual activity: Yes    Partners: Male    Birth control/protection: Post-menopausal

## 2020-10-25 NOTE — Therapy (Signed)
[place], [place], [address] Phone: [phone]   Fax:  [phone]  Physical Therapy Evaluation  Patient Details  Name: Brittany [name] MRN: [id number] Date of Birth: 07-Sep-1945 Referring Provider (PT): Dr. Erlinda Hong, MD   Encounter Date: 10/25/2020   PT End of Session - 10/25/20 1107     Visit Number 1    Number of Visits 18    Date for PT Re-Evaluation 12/06/20    Authorization Type [employer]    Progress Note Due on Visit 10    PT Start Time 1018    PT Stop Time 1056    PT Time Calculation (min) 38 min    Activity Tolerance Patient tolerated treatment well;No increased pain    Behavior During Therapy [facility] for tasks assessed/performed             Past Medical History:  Diagnosis Date   A-fib ([place])    Acid reflux    Acute cystitis with positive culture 03/06/2014   Anemia    Anxiety    Arthritis    Baker cyst, left 04/06/2016   Aspirated 04/06/2016   Breast cancer ([place]) 11/23/2007   right, ER/PR -, HER 2 -   Cognitive deficits 09/25/2014   2016 Very mild, could be related to meds    Degenerative arthritis of left knee 04/06/2016   Injection and aspiration 04/06/2016   Degenerative arthritis of right knee 09/27/2015   Depression    Esophageal stricture    Gastritis 10/2007   History of chemotherapy    neoadjuvant chemo, last dose 03/2008   History of radiation therapy 07/25/08 -09/11/08   right breast   Hx of colonic polyps 08/11/2016   Hyperlipidemia    Migraines    rare   Myalgia 07/18/2013   5/15 multiple site    OSA (obstructive sleep apnea)    severe OSA AHI 34, intolerant to PAP and now s/p hypoglossal nerve stimulator implant   Osteopenia    Rupture of quadriceps tendon 03/23/2017    Past Surgical History:  Procedure Laterality Date   ATRIAL FIBRILLATION ABLATION N/A 10/07/2018   Procedure: ATRIAL FIBRILLATION ABLATION;  Surgeon: [name], MD;  Location: [facility];  Service: Cardiovascular;  Laterality: N/A;   BREAST LUMPECTOMY  06/26/2008   right, CA   XRT, chemo   COLONOSCOPY     DRUG INDUCED ENDOSCOPY N/A 02/21/2020   Procedure: DRUG INDUCED ENDOSCOPY;  Surgeon: Melida Quitter, MD;  Location: [facility];  Service: ENT;  Laterality: N/A;   EYE SURGERY Bilateral    growth removed per right thigh     as teen - mole   IMPLANTATION OF HYPOGLOSSAL NERVE STIMULATOR N/A 05/15/2020   Procedure: IMPLANTATION OF HYPOGLOSSAL NERVE STIMULATOR;  Surgeon: Melida Quitter, MD;  Location: [facility];  Service: ENT;  Laterality: N/A;   JOINT REPLACEMENT  12/31/2015   RTK   QUADRICEPS TENDON REPAIR Right 09/14/2016   Procedure: OPEN REPAIR QUADRICEP TENDON;  Surgeon: [name], MD;  Location[facility];  Service: Orthopedics;  Laterality: Right;   TOTAL KNEE ARTHROPLASTY Right 12/31/2015   Procedure: RIGHT TOTAL KNEE ARTHROPLASTY;  Surgeon: [name], MD;  [facility];  Service: Orthopedics;  Laterality: Right;   TOTAL KNEE ARTHROPLASTY Left 10/11/2020   Procedure: TOTAL KNEE ARTHROPLASTY;  Surgeon: [name], MD;  Location: [facility];  Service: Orthopedics;  Laterality: Left;   UPPER GASTROINTESTINAL ENDOSCOPY      There were  no vitals filed for this visit.    Subjective Assessment - 10/25/20 1024     Subjective I am doing great; I don't use the walker at home. Fell 3x when I first got home being too independent. I have the CPM machine and am using it home. Using the potty chair in the shower; [place] discharged 10/24/20.    Pertinent History L TKR 10/11/20    Limitations Sitting    How long can you sit comfortably? indeterminate amount of time    How long can you stand comfortably? unknown    How long can you walk comfortably? unknown    Patient Stated Goals to be able to walk more normally    Currently in Pain? Yes    Pain Score 4     Pain Location Knee    Pain Orientation Left    Pain Descriptors / Indicators Aching    Pain Type  Surgical pain    Pain Onset 1 to 4 weeks ago    Pain Frequency Intermittent    Aggravating Factors  unknown; pt has not performed a lot of activities    Pain Relieving Factors icing    Effect of Pain on Daily Activities pt is not performing a lot of activities    Multiple Pain Sites No                [facility] PT Assessment - 10/25/20 0001       Assessment   Medical Diagnosis L TKR 10/11/20    Referring Provider (PT) Dr. Erlinda Hong, MD    Onset Date/Surgical Date 10/11/20    Hand Dominance Left    Prior Therapy HHPT      Precautions   Precautions None      Restrictions   Weight Bearing Restrictions No      Balance Screen   Has the patient fallen in the past 6 months Yes    How many times? 3    Has the patient had a decrease in activity level because of a fear of falling?  No    Is the patient reluctant to leave their home because of a fear of falling?  No      Home Ecologist residence    Additional Comments 2 steps to enter home, step to      Prior Function   Level of Independence Needs assistance with ADLs   compression hose needs assist with only     Cognition   Overall Cognitive Status Within Functional Limits for tasks assessed      Observation/Other Assessments   Observations steristrips still on incision; yellow bruising still present    Focus on Therapeutic Outcomes (FOTO)  49% function      Sensation   Light Touch Impaired by gross assessment   numbness around knee     Coordination   Gross Motor Movements are Fluid and Coordinated Yes      ROM / Strength   AROM / PROM / Strength AROM;Strength      AROM   Overall AROM Comments R flex 136, ext 0. L knee ext -15, flex 101,      Strength   Overall Strength Comments R hip flex 4+, knee ext 5/5, knee flex 4/5, DF 4-/5; L hip flex 2-/5, knee ext 2-/5, knee flex 3+/5, DF 4-/5      Palpation   Palpation comment peripatella swelling still present esp from mid patella up; ITB tight L;  L tight quad  band, knot palpated at popliteal fossa with tenderness reported without warmth, L hip flexors tight      Ambulation/Gait   Gait Comments decreased stance time on L, L foot toes out, , no knee flexion, stiff with hip extension, minimal heel strike                        Objective measurements completed on examination: See above findings.                 PT Short Term Goals - 10/25/20 1105       PT SHORT TERM GOAL #1   Title Pt will be indep with HEP    Baseline not issued at eval    Time 3    Period Weeks    Status New    Target Date 11/15/20      PT SHORT TERM GOAL #2   Title Pt will be able to stand x 6 min to perform exercises without sitting    Baseline 1.5 min    Time 3    Period Weeks    Status New    Target Date 11/15/20      PT SHORT TERM GOAL #3   Title Pt will demo improved gait with heel strike, knee flexion, improved hip extension with LRAD x 5 min    Baseline pt has not walked for any  length of time due to staying at home after surgery; she does not have heel strike and amb with knee ext and minimal hip ext with RW    Time 3    Period Weeks    Status New    Target Date 11/15/20               PT Long Term Goals - 10/25/20 1100       PT LONG TERM GOAL #1   Title Pt will increase L LE strength >/=1 MMT grade to assist with improved energy efficiency    Baseline R hip flex 4+, knee ext 5/5, knee flex 4/5, DF 4-/5; L hip flex 2-/5, knee ext 2-/5, knee flex 3+/5, DF 4-/5    Time 6    Period Weeks    Status New    Target Date 12/06/20      PT LONG TERM GOAL #2   Title Pt will improve L knee flexion to lacking </=-5 degrees to assist with more normalized gt for shopping    Baseline L knee ext -15, flex 101,    Time 6    Period Weeks    Status New    Target Date 12/06/20      PT LONG TERM GOAL #3   Title Pt will improve L knee flexion to >/=115 degrees to assist with transfers    Baseline L knee ext -15, flex  101,    Time 6    Period Weeks    Status New    Target Date 12/06/20      PT LONG TERM GOAL #4   Title Pt will improve Foto score to 67% function    Baseline 49% function    Time 6    Period Weeks    Status New    Target Date 12/06/20                    Plan - 10/25/20 1112     Clinical Impression Statement Pt presents to PT s/p L TKR 10/11/20 with abnormality of  gait, weakness, and decreased AROM. Pt would benefit from PT to address deficits via manual therapy, theract, GT, and modalities.    Personal Factors and Comorbidities Comorbidity 3+    Comorbidities osteoporosis, hx of Breast CA, hx of Bakers cyst, R TKR    Examination-Activity Limitations Squat;Stairs;Locomotion Level;Stand    Examination-Participation Restrictions Cleaning;Shop;Community Activity;Meal Prep;Driving    Stability/Clinical Decision Making Stable/Uncomplicated    Clinical Decision Making Low    Rehab Potential Good    PT Frequency 3x / week    PT Duration 6 weeks    PT Treatment/Interventions ADLs/Self Care Home Management;Cryotherapy;Electrical Stimulation;DME Instruction;Gait training;Stair training;Balance training;Therapeutic exercise;Therapeutic activities;Functional mobility training;Neuromuscular re-education;Patient/family education;Manual techniques;Scar mobilization;Passive range of motion;Dry needling;Taping    PT Next Visit Plan Issue HEP, work on L knee ROM and strength, GT    PT Home Exercise Plan pt has HHPT HEP    Consulted and Agree with Plan of Care Patient             Patient will benefit from skilled therapeutic intervention in order to improve the following deficits and impairments:  Abnormal gait, Decreased endurance, Decreased mobility, Difficulty walking, Impaired sensation, Decreased scar mobility, Decreased range of motion, Decreased activity tolerance, Decreased strength, Impaired flexibility, Pain  Visit Diagnosis: Muscle weakness (generalized)  Abnormality of  gait  Acute pain of left knee     Problem List Patient Active Problem List   Diagnosis Date Noted   Status post total left knee replacement 10/11/2020   Primary osteoarthritis of left knee 10/02/2020   Atrial fibrillation ([place]) 05/13/2018   Bloating 04/11/2018   Abnormal EKG 08/23/2017   Malignant neoplasm of overlapping sites of right breast in female, estrogen receptor positive ([place]) 08/17/2016   Hx of colonic polyps 08/11/2016   Iron deficiency anemia 07/28/2016   Blood donor 07/28/2016   Pain due to total right knee replacement ([place]) 04/06/2016   S/P right TKA 12/31/2015   Baker's cyst, unruptured 09/27/2015   Insomnia 07/31/2015   Onychomycosis 09/25/2014   Urinary frequency 05/21/2014   Mitral valve prolapse 05/08/2014   Mitral regurgitation 05/08/2014   Dyslipidemia 02/09/2014   History of radiation therapy    Hiatal hernia    History of chemotherapy    Osteoporosis 03/21/2012   Seasonal and perennial allergic rhinitis 12/17/2011   B12 deficiency 10/09/2009   TOBACCO USE, QUIT 01/31/2009   THYROID NODULE 12/25/2008   GERD 11/23/2007   VARICOSE VEIN 12/29/2006   Anxiety state 12/23/2006   Depression 12/23/2006   Migraine variant 12/23/2006   Disorder of bone and cartilage 12/23/2006    America Brown, PT, DPT 10/25/2020, 11:24 AM  [facility] [address] [place], [place], [address] Phone: [phone]   Fax:  [phone]  Name: Brittany [name] MRN: [id number] Date of Birth: 07/18/1945

## 2020-10-25 NOTE — Patient Instructions (Addendum)
Instructed on gait training with heel strike and knee flex for swing thru and to use a walker when working on gait. Pt denies any concerns over throw rugs about the home and states HH did a home assessment.

## 2020-10-28 ENCOUNTER — Ambulatory Visit: Payer: PPO | Admitting: Physical Therapy

## 2020-10-28 ENCOUNTER — Other Ambulatory Visit: Payer: Self-pay

## 2020-10-28 ENCOUNTER — Encounter: Payer: Self-pay | Admitting: Physical Therapy

## 2020-10-28 DIAGNOSIS — M25562 Pain in left knee: Secondary | ICD-10-CM | POA: Diagnosis not present

## 2020-10-28 DIAGNOSIS — R269 Unspecified abnormalities of gait and mobility: Secondary | ICD-10-CM | POA: Diagnosis not present

## 2020-10-28 DIAGNOSIS — M6281 Muscle weakness (generalized): Secondary | ICD-10-CM

## 2020-10-28 NOTE — Therapy (Signed)
[place], [place], [address] Phone: [phone]   Fax:  [phone]  Physical Therapy Treatment  Patient Details  Name: Brittany [name] MRN: [id number] Date of Birth: 1945-03-29 Referring Provider (PT): Dr. Erlinda Hong, MD   Encounter Date: 10/28/2020   PT End of Session - 10/28/20 0907     Visit Number 2    Number of Visits 18    Date for PT Re-Evaluation 12/06/20    Authorization Type [employer]    PT Start Time 0845    PT Stop Time 0925    PT Time Calculation (min) 40 min    Activity Tolerance Patient tolerated treatment well;No increased pain    Behavior During Therapy [facility] for tasks assessed/performed             Past Medical History:  Diagnosis Date   A-fib ([place])    Acid reflux    Acute cystitis with positive culture 03/06/2014   Anemia    Anxiety    Arthritis    Baker cyst, left 04/06/2016   Aspirated 04/06/2016   Breast cancer ([place]) 11/23/2007   right, ER/PR -, HER 2 -   Cognitive deficits 09/25/2014   2016 Very mild, could be related to meds    Degenerative arthritis of left knee 04/06/2016   Injection and aspiration 04/06/2016   Degenerative arthritis of right knee 09/27/2015   Depression    Esophageal stricture    Gastritis 10/2007   History of chemotherapy    neoadjuvant chemo, last dose 03/2008   History of radiation therapy 07/25/08 -09/11/08   right breast   Hx of colonic polyps 08/11/2016   Hyperlipidemia    Migraines    rare   Myalgia 07/18/2013   5/15 multiple site    OSA (obstructive sleep apnea)    severe OSA AHI 34, intolerant to PAP and now s/p hypoglossal nerve stimulator implant   Osteopenia    Rupture of quadriceps tendon 03/23/2017    Past Surgical History:  Procedure Laterality Date   ATRIAL FIBRILLATION ABLATION N/A 10/07/2018   Procedure: ATRIAL FIBRILLATION ABLATION;  Surgeon: [name], MD;  Location: [facility];  Service: Cardiovascular;   Laterality: N/A;   BREAST LUMPECTOMY  06/26/2008   right, CA   XRT, chemo   COLONOSCOPY     DRUG INDUCED ENDOSCOPY N/A 02/21/2020   Procedure: DRUG INDUCED ENDOSCOPY;  Surgeon: Melida Quitter, MD;  Location: [facility];  Service: ENT;  Laterality: N/A;   EYE SURGERY Bilateral    growth removed per right thigh     as teen - mole   IMPLANTATION OF HYPOGLOSSAL NERVE STIMULATOR N/A 05/15/2020   Procedure: IMPLANTATION OF HYPOGLOSSAL NERVE STIMULATOR;  Surgeon: Melida Quitter, MD;  Location: [facility];  Service: ENT;  Laterality: N/A;   JOINT REPLACEMENT  12/31/2015   RTK   QUADRICEPS TENDON REPAIR Right 09/14/2016   Procedure: OPEN REPAIR QUADRICEP TENDON;  Surgeon: [name], MD;  [facility];  Service: Orthopedics;  Laterality: Right;   TOTAL KNEE ARTHROPLASTY Right 12/31/2015   Procedure: RIGHT TOTAL KNEE ARTHROPLASTY;  Surgeon: [name], MD;  Location: [facility];  Service: Orthopedics;  Laterality: Right;   TOTAL KNEE ARTHROPLASTY Left 10/11/2020   Procedure: TOTAL KNEE ARTHROPLASTY;  Surgeon: [name], MD;  Location: [place] [facility];  Service: Orthopedics;  Laterality: Left;   UPPER GASTROINTESTINAL ENDOSCOPY      There were no vitals filed for this visit.   Subjective  Assessment - 10/28/20 0905     Subjective Pt arriving reporting 4/10 pain in left knee. Pt stating she has been using the CPM to 120 degrees.    Pertinent History L TKR 10/11/20    Limitations Sitting    How long can you sit comfortably? indeterminate amount of time    Patient Stated Goals to be able to walk more normally    Currently in Pain? Yes    Pain Score 4     Pain Orientation Left    Pain Descriptors / Indicators Aching                [facility] PT Assessment - 10/28/20 0001       Assessment   Medical Diagnosis L TKR 10/11/20    Referring Provider (PT) Dr. Erlinda Hong, MD    Onset Date/Surgical Date 10/11/20    Hand Dominance Left    Prior Therapy [facility]      ROM / Strength   AROM / PROM /  Strength PROM;AROM      AROM   AROM Assessment Site Knee    Right/Left Knee Left    Left Knee Extension -10    Left Knee Flexion 108      PROM   PROM Assessment Site Knee    Right/Left Knee Left    Left Knee Extension -8    Left Knee Flexion 110                           [facility] Adult PT Treatment/Exercise - 10/28/20 0001       Exercises   Exercises Knee/Hip      Knee/Hip Exercises: Stretches   Gastroc Stretch Both;30 seconds;2 reps    Gastroc Stretch Limitations slant board      Knee/Hip Exercises: Aerobic   Recumbent Bike L2 full revolutions      Knee/Hip Exercises: Machines for Strengthening   Cybex Leg Press 50# bilateral LE 2x10, Lt LE only: 18# 2x10      Knee/Hip Exercises: Standing   Heel Raises 10 reps      Knee/Hip Exercises: Seated   Long Arc Quad Strengthening;AROM;Left;10 reps;2 sets    Other Seated Knee/Hip Exercises tailgait: Rt LE pushing Lt knee into flexion    Sit to Sand 10 reps;with UE support      Knee/Hip Exercises: Supine   Straight Leg Raises Strengthening;Left;10 reps      Knee/Hip Exercises: Prone   Other Prone Exercises Added prone knee hangs to pt's HEP      Manual Therapy   Manual therapy comments RPOM left knee flexion and extension                    PT Education - 10/28/20 0906     Education Details HEP review    Person(s) Educated Patient    Methods Explanation;Demonstration    Comprehension Verbalized understanding;Returned demonstration              PT Short Term Goals - 10/28/20 0917       PT SHORT TERM GOAL #1   Title Pt will be indep with HEP    Baseline issued on 10/28/2020    Status On-going      PT SHORT TERM GOAL #2   Title Pt will be able to stand x 6 min to perform exercises without sitting    Status On-going      PT SHORT TERM GOAL #3   Title  Pt will demo improved gait with heel strike, knee flexion, improved hip extension with LRAD x 5 min    Status On-going      PT  SHORT TERM GOAL #4   Title -               PT Long Term Goals - 10/25/20 1100       PT LONG TERM GOAL #1   Title Pt will increase L LE strength >/=1 MMT grade to assist with improved energy efficiency    Baseline R hip flex 4+, knee ext 5/5, knee flex 4/5, DF 4-/5; L hip flex 2-/5, knee ext 2-/5, knee flex 3+/5, DF 4-/5    Time 6    Period Weeks    Status New    Target Date 12/06/20      PT LONG TERM GOAL #2   Title Pt will improve L knee flexion to lacking </=-5 degrees to assist with more normalized gt for shopping    Baseline L knee ext -15, flex 101,    Time 6    Period Weeks    Status New    Target Date 12/06/20      PT LONG TERM GOAL #3   Title Pt will improve L knee flexion to >/=115 degrees to assist with transfers    Baseline L knee ext -15, flex 101,    Time 6    Period Weeks    Status New    Target Date 12/06/20      PT LONG TERM GOAL #4   Title Pt will improve Foto score to 67% function    Baseline 49% function    Time 6    Period Weeks    Status New    Target Date 12/06/20                   Plan - 10/28/20 0909     Clinical Impression Statement Pt tolerating exericses well today with progression of ROM. Pt with weakness noted in left quads. Pt reporting she is no longer using the walker. Pt edu in heel to toe gait with increased knee flexion and push off of her left LE. HEP created in [place]. Continue skilled PT.    Personal Factors and Comorbidities Comorbidity 3+    Comorbidities osteoporosis, hx of Breast CA, hx of Bakers cyst, R TKR    Examination-Activity Limitations Squat;Stairs;Locomotion Level;Stand    Examination-Participation Restrictions Cleaning;Shop;Community Activity;Meal Prep;Driving    Stability/Clinical Decision Making Stable/Uncomplicated    Rehab Potential Good    PT Frequency 3x / week    PT Duration 6 weeks    PT Treatment/Interventions ADLs/Self Care Home Management;Cryotherapy;Electrical Stimulation;DME  Instruction;Gait training;Stair training;Balance training;Therapeutic exercise;Therapeutic activities;Functional mobility training;Neuromuscular re-education;Patient/family education;Manual techniques;Scar mobilization;Passive range of motion;Dry needling;Taping    PT Next Visit Plan work on L knee ROM and strength, GT    PT Home Exercise Plan Access Code: [email or web address]  URL: [email or web address]  Date: 10/28/2020  Prepared by: Kearney Hard    Exercises  Long Sitting Quad Set with Towel Roll Under Heel - 3 x daily - 7 x weekly - 2 sets - 10 reps - 5 seconds hold  Supine Active Straight Leg Raise - 3 x daily - 7 x weekly - 2 sets - 10 reps  Heel Raises with Counter Support - 3 x daily - 7 x weekly - 2 sets - 10 reps  Sit to Stand - 3 x daily - 7 x weekly - 2 sets -  10 reps  Seated Long Arc Quad - 3 x daily - 7 x weekly - 2 sets - 10 reps - 5 seconds hold  Prone Knee Extension Hang - 3 x daily - 7 x weekly - start with 2 minutes and progress to 10 hold    Consulted and Agree with Plan of Care Patient             Patient will benefit from skilled therapeutic intervention in order to improve the following deficits and impairments:  Abnormal gait, Decreased endurance, Decreased mobility, Difficulty walking, Impaired sensation, Decreased scar mobility, Decreased range of motion, Decreased activity tolerance, Decreased strength, Impaired flexibility, Pain  Visit Diagnosis: Muscle weakness (generalized)  Abnormality of gait  Acute pain of left knee     Problem List Patient Active Problem List   Diagnosis Date Noted   Status post total left knee replacement 10/11/2020   Primary osteoarthritis of left knee 10/02/2020   Atrial fibrillation ([place]) 05/13/2018   Bloating 04/11/2018   Abnormal EKG 08/23/2017   Malignant neoplasm of overlapping sites of right breast in female, estrogen receptor positive ([place]) 08/17/2016   Hx of colonic polyps 08/11/2016   Iron deficiency anemia  07/28/2016   Blood donor 07/28/2016   Pain due to total right knee replacement ([place]) 04/06/2016   S/P right TKA 12/31/2015   Baker's cyst, unruptured 09/27/2015   Insomnia 07/31/2015   Onychomycosis 09/25/2014   Urinary frequency 05/21/2014   Mitral valve prolapse 05/08/2014   Mitral regurgitation 05/08/2014   Dyslipidemia 02/09/2014   History of radiation therapy    Hiatal hernia    History of chemotherapy    Osteoporosis 03/21/2012   Seasonal and perennial allergic rhinitis 12/17/2011   B12 deficiency 10/09/2009   TOBACCO USE, QUIT 01/31/2009   THYROID NODULE 12/25/2008   GERD 11/23/2007   VARICOSE VEIN 12/29/2006   Anxiety state 12/23/2006   Depression 12/23/2006   Migraine variant 12/23/2006   Disorder of bone and cartilage 12/23/2006    Oretha Caprice, PT, MPT  10/28/2020, 10:01 AM  [facility] Physical Therapy [address] [place], [place], [address] Phone: [phone]   Fax:  [phone]  Name: Brittany [name] MRN: [id number] Date of Birth: 15-Feb-1946

## 2020-10-29 ENCOUNTER — Encounter: Payer: Self-pay | Admitting: Rehabilitative and Restorative Service Providers"

## 2020-10-29 ENCOUNTER — Ambulatory Visit: Payer: PPO | Admitting: Rehabilitative and Restorative Service Providers"

## 2020-10-29 DIAGNOSIS — M25562 Pain in left knee: Secondary | ICD-10-CM | POA: Diagnosis not present

## 2020-10-29 DIAGNOSIS — R269 Unspecified abnormalities of gait and mobility: Secondary | ICD-10-CM

## 2020-10-29 DIAGNOSIS — M6281 Muscle weakness (generalized): Secondary | ICD-10-CM

## 2020-10-29 NOTE — Therapy (Signed)
[facility] Physical Therapy [address] [place], [place], [address] Phone: [phone]   Fax:  [phone]  Physical Therapy Treatment  Patient Details  Name: Brittany [name] MRN: [id number] Date of Birth: 06-30-1945 Referring Provider (PT): Dr. Erlinda Hong, MD   Encounter Date: 10/29/2020   PT End of Session - 10/29/20 0846     Visit Number 3    Number of Visits 18    Date for PT Re-Evaluation 12/06/20    Authorization Type [employer]    Progress Note Due on Visit 10    PT Start Time 0845    PT Stop Time 0926    PT Time Calculation (min) 41 min    Activity Tolerance Patient tolerated treatment well    Behavior During Therapy [facility] for tasks assessed/performed             Past Medical History:  Diagnosis Date   A-fib ([place])    Acid reflux    Acute cystitis with positive culture 03/06/2014   Anemia    Anxiety    Arthritis    Baker cyst, left 04/06/2016   Aspirated 04/06/2016   Breast cancer ([place]) 11/23/2007   right, ER/PR -, HER 2 -   Cognitive deficits 09/25/2014   2016 Very mild, could be related to meds    Degenerative arthritis of left knee 04/06/2016   Injection and aspiration 04/06/2016   Degenerative arthritis of right knee 09/27/2015   Depression    Esophageal stricture    Gastritis 10/2007   History of chemotherapy    neoadjuvant chemo, last dose 03/2008   History of radiation therapy 07/25/08 -09/11/08   right breast   Hx of colonic polyps 08/11/2016   Hyperlipidemia    Migraines    rare   Myalgia 07/18/2013   5/15 multiple site    OSA (obstructive sleep apnea)    severe OSA AHI 34, intolerant to PAP and now s/p hypoglossal nerve stimulator implant   Osteopenia    Rupture of quadriceps tendon 03/23/2017    Past Surgical History:  Procedure Laterality Date   ATRIAL FIBRILLATION ABLATION N/A 10/07/2018   Procedure: ATRIAL FIBRILLATION ABLATION;  Surgeon: [name], MD;  Location: [facility];  Service:  Cardiovascular;  Laterality: N/A;   BREAST LUMPECTOMY  06/26/2008   right, CA   XRT, chemo   COLONOSCOPY     DRUG INDUCED ENDOSCOPY N/A 02/21/2020   Procedure: DRUG INDUCED ENDOSCOPY;  Surgeon: Melida Quitter, MD;  Location: [facility];  Service: ENT;  Laterality: N/A;   EYE SURGERY Bilateral    growth removed per right thigh     as teen - mole   IMPLANTATION OF HYPOGLOSSAL NERVE STIMULATOR N/A 05/15/2020   Procedure: IMPLANTATION OF HYPOGLOSSAL NERVE STIMULATOR;  Surgeon: Melida Quitter, MD;  Location: [facility];  Service: ENT;  Laterality: N/A;   JOINT REPLACEMENT  12/31/2015   RTK   QUADRICEPS TENDON REPAIR Right 09/14/2016   Procedure: OPEN REPAIR QUADRICEP TENDON;  Surgeon: [name], MD;  [facility];  Service: Orthopedics;  Laterality: Right;   TOTAL KNEE ARTHROPLASTY Right 12/31/2015   Procedure: RIGHT TOTAL KNEE ARTHROPLASTY;  Surgeon: [name], MD;  [facility]: [facility];  Service: Orthopedics;  Laterality: Right;   TOTAL KNEE ARTHROPLASTY Left 10/11/2020   Procedure: TOTAL KNEE ARTHROPLASTY;  Surgeon: [name], MD;  Location: [facility];  Service: Orthopedics;  Laterality: Left;   UPPER GASTROINTESTINAL ENDOSCOPY      There were no vitals  filed for this visit.                      [facility] Adult PT Treatment/Exercise - 10/29/20 0001       Knee/Hip Exercises: Stretches   Gastroc Stretch 30 seconds;Both   incline board   Other Knee/Hip Stretches supine heel prop 4 mins (discussed cues for home use and mixing heel prop and prone hang as tolerated - Pt. indicated she was able to get into supine position better)      Knee/Hip Exercises: Aerobic   Recumbent Bike Lvl 1 6 mins full revolutions seat 6      Knee/Hip Exercises: Machines for Strengthening   Cybex Leg Press 50# bilateral LE 2x10, Lt LE only: 31# 2x10      Knee/Hip Exercises: Seated   Long Arc Quad Left;2 sets;10 reps   pause in extension and flexion   Other Seated Knee/Hip Exercises  seated SLR x 10 Lt                      PT Short Term Goals - 10/28/20 0917       PT SHORT TERM GOAL #1   Title Pt will be indep with HEP    Baseline issued on 10/28/2020    Status On-going      PT SHORT TERM GOAL #2   Title Pt will be able to stand x 6 min to perform exercises without sitting    Status On-going      PT SHORT TERM GOAL #3   Title Pt will demo improved gait with heel strike, knee flexion, improved hip extension with LRAD x 5 min    Status On-going      PT SHORT TERM GOAL #4   Title -               PT Long Term Goals - 10/25/20 1100       PT LONG TERM GOAL #1   Title Pt will increase L LE strength >/=1 MMT grade to assist with improved energy efficiency    Baseline R hip flex 4+, knee ext 5/5, knee flex 4/5, DF 4-/5; L hip flex 2-/5, knee ext 2-/5, knee flex 3+/5, DF 4-/5    Time 6    Period Weeks    Status New    Target Date 12/06/20      PT LONG TERM GOAL #2   Title Pt will improve L knee flexion to lacking </=-5 degrees to assist with more normalized gt for shopping    Baseline L knee ext -15, flex 101,    Time 6    Period Weeks    Status New    Target Date 12/06/20      PT LONG TERM GOAL #3   Title Pt will improve L knee flexion to >/=115 degrees to assist with transfers    Baseline L knee ext -15, flex 101,    Time 6    Period Weeks    Status New    Target Date 12/06/20      PT LONG TERM GOAL #4   Title Pt will improve Foto score to 67% function    Baseline 49% function    Time 6    Period Weeks    Status New    Target Date 12/06/20                   Plan - 10/29/20 [id number]  Clinical Impression Statement Extension deficits most impactful in movement at this time, resulting in antalgic gait and lacking extension in stance.  Spent time throughout visit detailing importance of prolonged static stretching (supine heel prop, prone hang) 4-5 x per day to improve extension mobility.    Personal Factors and  Comorbidities Comorbidity 3+    Comorbidities osteoporosis, hx of Breast CA, hx of Bakers cyst, R TKR    Examination-Activity Limitations Squat;Stairs;Locomotion Level;Stand    Examination-Participation Restrictions Cleaning;Shop;Community Activity;Meal Prep;Driving    Stability/Clinical Decision Making Stable/Uncomplicated    Rehab Potential Good    PT Frequency 3x / week    PT Duration 6 weeks    PT Treatment/Interventions ADLs/Self Care Home Management;Cryotherapy;Electrical Stimulation;DME Instruction;Gait training;Stair training;Balance training;Therapeutic exercise;Therapeutic activities;Functional mobility training;Neuromuscular re-education;Patient/family education;Manual techniques;Scar mobilization;Passive range of motion;Dry needling;Taping    PT Next Visit Plan Extension mobility gains in long duration stretching, improve quad strength (add weight for LAQ), TKE improvement    PT Home Exercise Plan Access Code: [email or web address]  URL: [email or web address]  Date: 10/28/2020  Prepared by: Kearney Hard    Exercises  Long Sitting Quad Set with Towel Roll Under Heel - 3 x daily - 7 x weekly - 2 sets - 10 reps - 5 seconds hold  Supine Active Straight Leg Raise - 3 x daily - 7 x weekly - 2 sets - 10 reps  Heel Raises with Counter Support - 3 x daily - 7 x weekly - 2 sets - 10 reps  Sit to Stand - 3 x daily - 7 x weekly - 2 sets - 10 reps  Seated Long Arc Quad - 3 x daily - 7 x weekly - 2 sets - 10 reps - 5 seconds hold  Prone Knee Extension Hang - 3 x daily - 7 x weekly - start with 2 minutes and progress to 10 hold    Consulted and Agree with Plan of Care Patient             Patient will benefit from skilled therapeutic intervention in order to improve the following deficits and impairments:  Abnormal gait, Decreased endurance, Decreased mobility, Difficulty walking, Impaired sensation, Decreased scar mobility, Decreased range of motion, Decreased activity tolerance, Decreased  strength, Impaired flexibility, Pain  Visit Diagnosis: Muscle weakness (generalized)  Abnormality of gait  Acute pain of left knee     Problem List Patient Active Problem List   Diagnosis Date Noted   Status post total left knee replacement 10/11/2020   Primary osteoarthritis of left knee 10/02/2020   Atrial fibrillation ([place]) 05/13/2018   Bloating 04/11/2018   Abnormal EKG 08/23/2017   Malignant neoplasm of overlapping sites of right breast in female, estrogen receptor positive ([place]) 08/17/2016   Hx of colonic polyps 08/11/2016   Iron deficiency anemia 07/28/2016   Blood donor 07/28/2016   Pain due to total right knee replacement ([place]) 04/06/2016   S/P right TKA 12/31/2015   Baker's cyst, unruptured 09/27/2015   Insomnia 07/31/2015   Onychomycosis 09/25/2014   Urinary frequency 05/21/2014   Mitral valve prolapse 05/08/2014   Mitral regurgitation 05/08/2014   Dyslipidemia 02/09/2014   History of radiation therapy    Hiatal hernia    History of chemotherapy    Osteoporosis 03/21/2012   Seasonal and perennial allergic rhinitis 12/17/2011   B12 deficiency 10/09/2009   TOBACCO USE, QUIT 01/31/2009   THYROID NODULE 12/25/2008   GERD 11/23/2007   VARICOSE VEIN 12/29/2006   Anxiety state 12/23/2006   Depression 12/23/2006  Migraine variant 12/23/2006   Disorder of bone and cartilage 12/23/2006    Scot Jun, PT, DPT, OCS, ATC 10/29/20  9:28 AM    [facility] Physical Therapy [address] [place], [place], [address] Phone: [phone]   Fax:  [phone]  Name: JEMMIE DANKERS MRN: [id number] Date of Birth: 10-17-1945

## 2020-10-31 ENCOUNTER — Other Ambulatory Visit: Payer: Self-pay

## 2020-10-31 ENCOUNTER — Ambulatory Visit: Payer: PPO | Admitting: Rehabilitative and Restorative Service Providers"

## 2020-10-31 ENCOUNTER — Encounter: Payer: Self-pay | Admitting: Rehabilitative and Restorative Service Providers"

## 2020-10-31 DIAGNOSIS — R269 Unspecified abnormalities of gait and mobility: Secondary | ICD-10-CM

## 2020-10-31 DIAGNOSIS — M6281 Muscle weakness (generalized): Secondary | ICD-10-CM | POA: Diagnosis not present

## 2020-10-31 DIAGNOSIS — M25562 Pain in left knee: Secondary | ICD-10-CM

## 2020-10-31 NOTE — Therapy (Addendum)
[facility] [address] [place], [place], [address] Phone: [phone]   Fax:  [phone]  Physical Therapy Treatment  Patient Details  Name: Brittany [name] MRN: [id number] Date of Birth: 1945/06/03 Referring Provider (PT): Dr. Erlinda Hong, MD   Encounter Date: 10/31/2020   PT End of Session - 10/31/20 1430     Visit Number 4    Number of Visits 18    Date for PT Re-Evaluation 12/06/20    Authorization Type [employer]    Progress Note Due on Visit 10    PT Start Time 1429    PT Stop Time 1509    PT Time Calculation (min) 40 min    Activity Tolerance Patient tolerated treatment well    Behavior During Therapy [facility] for tasks assessed/performed             Past Medical History:  Diagnosis Date   A-fib ([place])    Acid reflux    Acute cystitis with positive culture 03/06/2014   Anemia    Anxiety    Arthritis    Baker cyst, left 04/06/2016   Aspirated 04/06/2016   Breast cancer ([place]) 11/23/2007   right, ER/PR -, HER 2 -   Cognitive deficits 09/25/2014   2016 Very mild, could be related to meds    Degenerative arthritis of left knee 04/06/2016   Injection and aspiration 04/06/2016   Degenerative arthritis of right knee 09/27/2015   Depression    Esophageal stricture    Gastritis 10/2007   History of chemotherapy    neoadjuvant chemo, last dose 03/2008   History of radiation therapy 07/25/08 -09/11/08   right breast   Hx of colonic polyps 08/11/2016   Hyperlipidemia    Migraines    rare   Myalgia 07/18/2013   5/15 multiple site    OSA (obstructive sleep apnea)    severe OSA AHI 34, intolerant to PAP and now s/p hypoglossal nerve stimulator implant   Osteopenia    Rupture of quadriceps tendon 03/23/2017    Past Surgical History:  Procedure Laterality Date   ATRIAL FIBRILLATION ABLATION N/A 10/07/2018   Procedure: ATRIAL FIBRILLATION ABLATION;  Surgeon: [name], MD;  Location: [place] [facility];  Service:  Cardiovascular;  Laterality: N/A;   BREAST LUMPECTOMY  06/26/2008   right, CA   XRT, chemo   COLONOSCOPY     DRUG INDUCED ENDOSCOPY N/A 02/21/2020   Procedure: DRUG INDUCED ENDOSCOPY;  Surgeon: Melida Quitter, MD;  Location: [place];  Service: ENT;  Laterality: N/A;   EYE SURGERY Bilateral    growth removed per right thigh     as teen - mole   IMPLANTATION OF HYPOGLOSSAL NERVE STIMULATOR N/A 05/15/2020   Procedure: IMPLANTATION OF HYPOGLOSSAL NERVE STIMULATOR;  Surgeon: Melida Quitter, MD;  Location: [facility];  Service: ENT;  Laterality: N/A;   JOINT REPLACEMENT  12/31/2015   RTK   QUADRICEPS TENDON REPAIR Right 09/14/2016   Procedure: OPEN REPAIR QUADRICEP TENDON;  Surgeon: [name], MD;  Location: [facility];  Service: Orthopedics;  Laterality: Right;   TOTAL KNEE ARTHROPLASTY Right 12/31/2015   Procedure: RIGHT TOTAL KNEE ARTHROPLASTY;  Surgeon: [name], MD;  Location: [facility];  Service: Orthopedics;  Laterality: Right;   TOTAL KNEE ARTHROPLASTY Left 10/11/2020   Procedure: TOTAL KNEE ARTHROPLASTY;  Surgeon: [name], MD;  Location: [place];  Service: Orthopedics;  Laterality: Left;   UPPER GASTROINTESTINAL ENDOSCOPY      There were no vitals  filed for this visit.    Subjective Assessment - 10/31/20 1442     Subjective Pt. stated no pain, just increased tightness from lack of movement today.    Pertinent History L TKR 10/11/20    Limitations Sitting    How long can you sit comfortably? indeterminate amount of time    Patient Stated Goals to be able to walk more normally    Currently in Pain? No/denies    Pain Score 0-No pain                            Objective measurements completed on examination: See above findings.       [place] Adult PT Treatment/Exercise - 10/31/20 0001       Neuro Re-ed    Neuro Re-ed Details  tandem stance 1 min x 2 bilateral c occasional hand assist      Knee/Hip Exercises: Stretches   Gastroc Stretch 60  seconds;3 reps;Both   incline board     Knee/Hip Exercises: Aerobic   Recumbent Bike Lvl 2 10 mins full revolution      Knee/Hip Exercises: Machines for Strengthening   Cybex Knee Extension eccentric lowering 5 lbs 2 x 10 Lt      Knee/Hip Exercises: Standing   Step Down Left;2 sets;10 reps;Hand Hold: 2;Step Height: 4"    Step Down Limitations lateral                      PT Short Term Goals - 10/28/20 0917       PT SHORT TERM GOAL #1   Title Pt will be indep with HEP    Baseline issued on 10/28/2020    Status On-going      PT SHORT TERM GOAL #2   Title Pt will be able to stand x 6 min to perform exercises without sitting    Status On-going      PT SHORT TERM GOAL #3   Title Pt will demo improved gait with heel strike, knee flexion, improved hip extension with LRAD x 5 min    Status On-going      PT SHORT TERM GOAL #4   Title -               PT Long Term Goals - 10/25/20 1100       PT LONG TERM GOAL #1   Title Pt will increase L LE strength >/=1 MMT grade to assist with improved energy efficiency    Baseline R hip flex 4+, knee ext 5/5, knee flex 4/5, DF 4-/5; L hip flex 2-/5, knee ext 2-/5, knee flex 3+/5, DF 4-/5    Time 6    Period Weeks    Status New    Target Date 12/06/20      PT LONG TERM GOAL #2   Title Pt will improve L knee flexion to lacking </=-5 degrees to assist with more normalized gt for shopping    Baseline L knee ext -15, flex 101,    Time 6    Period Weeks    Status New    Target Date 12/06/20      PT LONG TERM GOAL #3   Title Pt will improve L knee flexion to >/=115 degrees to assist with transfers    Baseline L knee ext -15, flex 101,    Time 6    Period Weeks    Status New    Target  Date 12/06/20      PT LONG TERM GOAL #4   Title Pt will improve Foto score to 67% function    Baseline 49% function    Time 6    Period Weeks    Status New    Target Date 12/06/20                    Plan - 10/31/20 1459      Clinical Impression Statement Fair control on static balance intervention and quad strength continued to show room for improvement to improve walking, eccentric loading in stairs, squats, etc.  Continued skilled PT services indicated at this time to continue progression.    Personal Factors and Comorbidities Comorbidity 3+    Comorbidities osteoporosis, hx of Breast CA, hx of Bakers cyst, R TKR    Examination-Activity Limitations Squat;Stairs;Locomotion Level;Stand    Examination-Participation Restrictions Cleaning;Shop;Community Activity;Meal Prep;Driving    Stability/Clinical Decision Making Stable/Uncomplicated    Rehab Potential Good    PT Frequency 3x / week    PT Duration 6 weeks    PT Treatment/Interventions ADLs/Self Care Home Management;Cryotherapy;Electrical Stimulation;DME Instruction;Gait training;Stair training;Balance training;Therapeutic exercise;Therapeutic activities;Functional mobility training;Neuromuscular re-education;Patient/family education;Manual techniques;Scar mobilization;Passive range of motion;Dry needling;Taping    PT Next Visit Plan TKE long duration stretching focus for extension, TKE activation, continued quad strengthening.    PT Home Exercise Plan Access Code: [email or web address]  URL: [email or web address]  Date: 10/28/2020  Prepared by: Kearney Hard    Exercises  Long Sitting Quad Set with Towel Roll Under Heel - 3 x daily - 7 x weekly - 2 sets - 10 reps - 5 seconds hold  Supine Active Straight Leg Raise - 3 x daily - 7 x weekly - 2 sets - 10 reps  Heel Raises with Counter Support - 3 x daily - 7 x weekly - 2 sets - 10 reps  Sit to Stand - 3 x daily - 7 x weekly - 2 sets - 10 reps  Seated Long Arc Quad - 3 x daily - 7 x weekly - 2 sets - 10 reps - 5 seconds hold  Prone Knee Extension Hang - 3 x daily - 7 x weekly - start with 2 minutes and progress to 10 hold    Consulted and Agree with Plan of Care Patient             Patient will benefit from  skilled therapeutic intervention in order to improve the following deficits and impairments:  Abnormal gait, Decreased endurance, Decreased mobility, Difficulty walking, Impaired sensation, Decreased scar mobility, Decreased range of motion, Decreased activity tolerance, Decreased strength, Impaired flexibility, Pain  Visit Diagnosis: Muscle weakness (generalized)  Abnormality of gait  Acute pain of left knee     Problem List Patient Active Problem List   Diagnosis Date Noted   Status post total left knee replacement 10/11/2020   Primary osteoarthritis of left knee 10/02/2020   Atrial fibrillation ([place]) 05/13/2018   Bloating 04/11/2018   Abnormal EKG 08/23/2017   Malignant neoplasm of overlapping sites of right breast in female, estrogen receptor positive ([place]) 08/17/2016   Hx of colonic polyps 08/11/2016   Iron deficiency anemia 07/28/2016   Blood donor 07/28/2016   Pain due to total right knee replacement ([place]) 04/06/2016   S/P right TKA 12/31/2015   Baker's cyst, unruptured 09/27/2015   Insomnia 07/31/2015   Onychomycosis 09/25/2014   Urinary frequency 05/21/2014   Mitral valve prolapse 05/08/2014   Mitral regurgitation 05/08/2014   Dyslipidemia  02/09/2014   History of radiation therapy    Hiatal hernia    History of chemotherapy    Osteoporosis 03/21/2012   Seasonal and perennial allergic rhinitis 12/17/2011   B12 deficiency 10/09/2009   TOBACCO USE, QUIT 01/31/2009   THYROID NODULE 12/25/2008   GERD 11/23/2007   VARICOSE VEIN 12/29/2006   Anxiety state 12/23/2006   Depression 12/23/2006   Migraine variant 12/23/2006   Disorder of bone and cartilage 12/23/2006    Scot Jun, PT, DPT, OCS, ATC 10/31/20  3:09 PM    [facility] [address] [place], [place], [address] Phone: [phone]   Fax:  [phone]  Name: Brittany [name] MRN: [id number] Date of Birth: [birth year]

## 2020-11-05 ENCOUNTER — Encounter: Payer: Self-pay | Admitting: Rehabilitative and Restorative Service Providers"

## 2020-11-05 ENCOUNTER — Other Ambulatory Visit: Payer: Self-pay

## 2020-11-05 ENCOUNTER — Ambulatory Visit: Payer: PPO | Admitting: Rehabilitative and Restorative Service Providers"

## 2020-11-05 DIAGNOSIS — M6281 Muscle weakness (generalized): Secondary | ICD-10-CM

## 2020-11-05 DIAGNOSIS — R269 Unspecified abnormalities of gait and mobility: Secondary | ICD-10-CM

## 2020-11-05 DIAGNOSIS — M25562 Pain in left knee: Secondary | ICD-10-CM | POA: Diagnosis not present

## 2020-11-05 NOTE — Therapy (Signed)
[facility] Therapy [address] [place], [place], [address] Phone: [phone]   Fax:  [phone]  Physical Therapy Treatment  Patient Details  Name: Brittany [name] MRN: [id number] Date of Birth: 1945-11-12 Referring Provider (PT): Dr. Erlinda Hong, MD   Encounter Date: 11/05/2020   PT End of Session - 11/05/20 0848     Visit Number 5    Number of Visits 18    Date for PT Re-Evaluation 12/06/20    Authorization Type [employer]    Progress Note Due on Visit 10    PT Start Time 0840    PT Stop Time 0920    PT Time Calculation (min) 40 min    Activity Tolerance Patient tolerated treatment well    Behavior During Therapy [facility] for tasks assessed/performed             Past Medical History:  Diagnosis Date   A-fib ([place])    Acid reflux    Acute cystitis with positive culture 03/06/2014   Anemia    Anxiety    Arthritis    Baker cyst, left 04/06/2016   Aspirated 04/06/2016   Breast cancer ([place]) 11/23/2007   right, ER/PR -, HER 2 -   Cognitive deficits 09/25/2014   2016 Very mild, could be related to meds    Degenerative arthritis of left knee 04/06/2016   Injection and aspiration 04/06/2016   Degenerative arthritis of right knee 09/27/2015   Depression    Esophageal stricture    Gastritis 10/2007   History of chemotherapy    neoadjuvant chemo, last dose 03/2008   History of radiation therapy 07/25/08 -09/11/08   right breast   Hx of colonic polyps 08/11/2016   Hyperlipidemia    Migraines    rare   Myalgia 07/18/2013   5/15 multiple site    OSA (obstructive sleep apnea)    severe OSA AHI 34, intolerant to PAP and now s/p hypoglossal nerve stimulator implant   Osteopenia    Rupture of quadriceps tendon 03/23/2017    Past Surgical History:  Procedure Laterality Date   ATRIAL FIBRILLATION ABLATION N/A 10/07/2018   Procedure: ATRIAL FIBRILLATION ABLATION;  Surgeon: [name], MD;  Location: [place] [facility];  Service:  Cardiovascular;  Laterality: N/A;   BREAST LUMPECTOMY  06/26/2008   right, CA   XRT, chemo   COLONOSCOPY     DRUG INDUCED ENDOSCOPY N/A 02/21/2020   Procedure: DRUG INDUCED ENDOSCOPY;  Surgeon: Melida Quitter, MD;  Location: [place];  Service: ENT;  Laterality: N/A;   EYE SURGERY Bilateral    growth removed per right thigh     as teen - mole   IMPLANTATION OF HYPOGLOSSAL NERVE STIMULATOR N/A 05/15/2020   Procedure: IMPLANTATION OF HYPOGLOSSAL NERVE STIMULATOR;  Surgeon: Melida Quitter, MD;  Location: [facility];  Service: ENT;  Laterality: N/A;   JOINT REPLACEMENT  12/31/2015   RTK   QUADRICEPS TENDON REPAIR Right 09/14/2016   Procedure: OPEN REPAIR QUADRICEP TENDON;  Surgeon: [name], MD;  [facility]: [facility];  Service: Orthopedics;  Laterality: Right;   TOTAL KNEE ARTHROPLASTY Right 12/31/2015   Procedure: RIGHT TOTAL KNEE ARTHROPLASTY;  Surgeon: [name], MD;  [facility]: [facility];  Service: Orthopedics;  Laterality: Right;   TOTAL KNEE ARTHROPLASTY Left 10/11/2020   Procedure: TOTAL KNEE ARTHROPLASTY;  Surgeon: [name], MD;  Location: [place];  Service: Orthopedics;  Laterality: Left;   UPPER GASTROINTESTINAL ENDOSCOPY      There were no vitals  filed for this visit.       [facility] PT Assessment - 11/05/20 0001       Assessment   Medical Diagnosis Lt TKR 10/11/20    Referring Provider (PT) Dr. Erlinda Hong, MD    Onset Date/Surgical Date 10/11/20      Ambulation/Gait   Ambulation/Gait Yes    Ambulation/Gait Assistance 7: Independent                           [facility] Adult PT Treatment/Exercise - 11/05/20 0001       Neuro Re-ed    Neuro Re-ed Details  tandem ambulation on foam 8 ft fwd/rev x 6 each way in bars (mild HHA), tandem stance 1 min x 1 bilateral on foam      Knee/Hip Exercises: Stretches   Gastroc Stretch 60 seconds;3 reps;Both   incline board     Knee/Hip Exercises: Aerobic   Recumbent Bike Lvl 3 7 mins      Knee/Hip Exercises:  Machines for Strengthening   Cybex Knee Extension eccentric lowering 5 lbs 3 x 10 Lt    Cybex Leg Press 62 lbs 2 x 10 double leg, single 37 lbs 2 x 10 Lt      Knee/Hip Exercises: Standing   Step Down Step Height: 4";Left;2 sets;10 reps   eccentric control focus   Step Down Limitations lateral step down                      PT Short Term Goals - 10/28/20 0917       PT SHORT TERM GOAL #1   Title Pt will be indep with HEP    Baseline issued on 10/28/2020    Status On-going      PT SHORT TERM GOAL #2   Title Pt will be able to stand x 6 min to perform exercises without sitting    Status On-going      PT SHORT TERM GOAL #3   Title Pt will demo improved gait with heel strike, knee flexion, improved hip extension with LRAD x 5 min    Status On-going      PT SHORT TERM GOAL #4   Title -               PT Long Term Goals - 10/25/20 1100       PT LONG TERM GOAL #1   Title Pt will increase L LE strength >/=1 MMT grade to assist with improved energy efficiency    Baseline R hip flex 4+, knee ext 5/5, knee flex 4/5, DF 4-/5; L hip flex 2-/5, knee ext 2-/5, knee flex 3+/5, DF 4-/5    Time 6    Period Weeks    Status New    Target Date 12/06/20      PT LONG TERM GOAL #2   Title Pt will improve L knee flexion to lacking </=-5 degrees to assist with more normalized gt for shopping    Baseline L knee ext -15, flex 101,    Time 6    Period Weeks    Status New    Target Date 12/06/20      PT LONG TERM GOAL #3   Title Pt will improve L knee flexion to >/=115 degrees to assist with transfers    Baseline L knee ext -15, flex 101,    Time 6    Period Weeks    Status New  Target Date 12/06/20      PT LONG TERM GOAL #4   Title Pt will improve Foto score to 67% function    Baseline 49% function    Time 6    Period Weeks    Status New    Target Date 12/06/20                   Plan - 11/05/20 0912     Clinical Impression Statement Progressed to  compliant surface balance c fair to good control overall.  Pt. to benefit from continued strengthening in NWB and WB activity with balance improvements.  Extension mobility gains in TKE still necessary to improve.    Personal Factors and Comorbidities Comorbidity 3+    Comorbidities osteoporosis, hx of Breast CA, hx of Bakers cyst, R TKR    Examination-Activity Limitations Squat;Stairs;Locomotion Level;Stand    Examination-Participation Restrictions Cleaning;Shop;Community Activity;Meal Prep;Driving    Stability/Clinical Decision Making Stable/Uncomplicated    Rehab Potential Good    PT Frequency 3x / week    PT Duration 6 weeks    PT Treatment/Interventions ADLs/Self Care Home Management;Cryotherapy;Electrical Stimulation;DME Instruction;Gait training;Stair training;Balance training;Therapeutic exercise;Therapeutic activities;Functional mobility training;Neuromuscular re-education;Patient/family education;Manual techniques;Scar mobilization;Passive range of motion;Dry needling;Taping    PT Next Visit Plan Compliant surface balance, dynamic stability (lateral movements) continued progression of quad strengthening.    PT Home Exercise Plan Access Code: [email or web address]  URL: [email or web address]  Date: 10/28/2020  Prepared by: Kearney Hard    Exercises  Long Sitting Quad Set with Towel Roll Under Heel - 3 x daily - 7 x weekly - 2 sets - 10 reps - 5 seconds hold  Supine Active Straight Leg Raise - 3 x daily - 7 x weekly - 2 sets - 10 reps  Heel Raises with Counter Support - 3 x daily - 7 x weekly - 2 sets - 10 reps  Sit to Stand - 3 x daily - 7 x weekly - 2 sets - 10 reps  Seated Long Arc Quad - 3 x daily - 7 x weekly - 2 sets - 10 reps - 5 seconds hold  Prone Knee Extension Hang - 3 x daily - 7 x weekly - start with 2 minutes and progress to 10 hold    Consulted and Agree with Plan of Care Patient             Patient will benefit from skilled therapeutic intervention in order to improve  the following deficits and impairments:  Abnormal gait, Decreased endurance, Decreased mobility, Difficulty walking, Impaired sensation, Decreased scar mobility, Decreased range of motion, Decreased activity tolerance, Decreased strength, Impaired flexibility, Pain  Visit Diagnosis: Muscle weakness (generalized)  Abnormality of gait  Acute pain of left knee     Problem List Patient Active Problem List   Diagnosis Date Noted   Status post total left knee replacement 10/11/2020   Primary osteoarthritis of left knee 10/02/2020   Atrial fibrillation ([place]) 05/13/2018   Bloating 04/11/2018   Abnormal EKG 08/23/2017   Malignant neoplasm of overlapping sites of right breast in female, estrogen receptor positive ([place]) 08/17/2016   Hx of colonic polyps 08/11/2016   Iron deficiency anemia 07/28/2016   Blood donor 07/28/2016   Pain due to total right knee replacement ([place]) 04/06/2016   S/P right TKA 12/31/2015   Baker's cyst, unruptured 09/27/2015   Insomnia 07/31/2015   Onychomycosis 09/25/2014   Urinary frequency 05/21/2014   Mitral valve prolapse 05/08/2014   Mitral regurgitation 05/08/2014   Dyslipidemia  02/09/2014   History of radiation therapy    Hiatal hernia    History of chemotherapy    Osteoporosis 03/21/2012   Seasonal and perennial allergic rhinitis 12/17/2011   B12 deficiency 10/09/2009   TOBACCO USE, QUIT 01/31/2009   THYROID NODULE 12/25/2008   GERD 11/23/2007   VARICOSE VEIN 12/29/2006   Anxiety state 12/23/2006   Depression 12/23/2006   Migraine variant 12/23/2006   Disorder of bone and cartilage 12/23/2006    Scot Jun, PT, DPT, OCS, ATC 11/05/20  9:17 AM    [facility] [address] [place], [place], [address] Phone: [phone]   Fax:  [phone]  Name: Brittany [name] MRN: [id number] Date of Birth: 08-Aug-1945

## 2020-11-06 ENCOUNTER — Encounter: Payer: Self-pay | Admitting: Family Medicine

## 2020-11-06 ENCOUNTER — Ambulatory Visit (INDEPENDENT_AMBULATORY_CARE_PROVIDER_SITE_OTHER): Payer: PPO | Admitting: Family Medicine

## 2020-11-06 VITALS — BP 100/50 | HR 94 | Temp 98.2°F | Ht 66.0 in | Wt 119.6 lb

## 2020-11-06 DIAGNOSIS — I491 Atrial premature depolarization: Secondary | ICD-10-CM

## 2020-11-06 DIAGNOSIS — Z8679 Personal history of other diseases of the circulatory system: Secondary | ICD-10-CM | POA: Diagnosis not present

## 2020-11-06 DIAGNOSIS — R001 Bradycardia, unspecified: Secondary | ICD-10-CM

## 2020-11-06 DIAGNOSIS — Z23 Encounter for immunization: Secondary | ICD-10-CM | POA: Diagnosis not present

## 2020-11-06 DIAGNOSIS — Z9889 Other specified postprocedural states: Secondary | ICD-10-CM | POA: Diagnosis not present

## 2020-11-06 NOTE — Progress Notes (Signed)
Brittany [name] DOB: [birth year] Encounter date: 11/06/2020  This is a 75 y.o. female who presents with Chief Complaint  Patient presents with   Hospitalization Follow-up    History of present illness: She has done great postoperatively. Knee is healing up well and she is extremely pleased with her surgeon and attention/care/therapy that she has received.   She notes that blood pressure is always low and wonders if that is a problem. She has not felt dizzy, light headed, palpitations, chest pain/pressure. Energy level has been good.  Allergies  Allergen Reactions   Boniva [Ibandronic Acid] Nausea Only   Hydrocodone-Acetaminophen Other (See Comments)    "Does not relieve pain", patient reported.  Can take Tylenol.   Lipitor [Atorvastatin] Other (See Comments)    Muscle and joint pain(s)   Pt request removal of this intolerance   Tramadol Other (See Comments)    "Does not help pain."- patient reported.   Current Meds  Medication Sig   apixaban (ELIQUIS) 5 MG TABS tablet Take 1 tablet (5 mg total) by mouth 2 (two) times daily.   Biotin 5000 MCG TABS Take 5,000 mcg by mouth daily.   buPROPion (WELLBUTRIN XL) 150 MG 24 hr tablet Take 1 tablet (150 mg total) by mouth daily.   Calcium-Magnesium-Zinc (CAL-MAG-ZINC PO) Take 1 tablet by mouth daily.   Cholecalciferol (VITAMIN D) 50 MCG (2000 UT) tablet Take 2,000 Units by mouth daily.   citalopram (CELEXA) 40 MG tablet Take 1 tablet (40 mg total) by mouth daily.   denosumab (PROLIA) 60 MG/ML SOSY injection Inject 60 mg into the skin every 6 (six) months.   diphenhydrAMINE (BENADRYL) 25 MG tablet Take 25 mg by mouth every 6 (six) hours as needed for allergies.   docusate sodium (COLACE) 100 MG capsule Take 1 capsule (100 mg total) by mouth daily as needed.   famotidine (PEPCID) 40 MG tablet TAKE 1 TABLET BY MOUTH EVERY DAY (Patient taking differently: Take 40 mg by mouth daily.)   fluticasone (FLONASE) 50 MCG/ACT nasal spray Place 2 sprays into  both nostrils daily.   HYDROmorphone (DILAUDID) 2 MG tablet Take 1 tablet (2 mg total) by mouth every 4 (four) hours as needed for severe pain.   methocarbamol (ROBAXIN) 500 MG tablet Take 1 tablet (500 mg total) by mouth 2 (two) times daily as needed.   Omega-3 Fatty Acids (FISH OIL) 1200 MG CAPS Take 1,200 mg by mouth daily.   ondansetron (ZOFRAN) 4 MG tablet Take 1 tablet (4 mg total) by mouth every 8 (eight) hours as needed for nausea or vomiting.   oxyCODONE-acetaminophen (PERCOCET) 5-325 MG tablet Take 1-2 tablets by mouth every 6 (six) hours as needed.   Probiotic Product (PROBIOTIC DAILY PO) Take 1 tablet by mouth daily.   SUMAtriptan (IMITREX) 100 MG tablet TAKE 1 TABLET BY MOUTH ONCE FOR 1 DOSE. MAY REPEAT IN 2 HOURS IF HEADACHE PERSISTS / RECURS (Patient taking differently: Take 100 mg by mouth every 2 (two) hours as needed for migraine.)   VALERIAN ROOT PO Take 1,200 mg by mouth at bedtime as needed (sleep).   vitamin B-12 (CYANOCOBALAMIN) 1000 MCG tablet Take 1,000 mcg by mouth daily.   zolpidem (AMBIEN) 10 MG tablet TAKE 0.5-1 TABLETS (5-10 MG TOTAL) BY MOUTH AT BEDTIME AS NEEDED. FOR SLEEP (Patient taking differently: Take 5 mg by mouth at bedtime as needed for sleep.)   Current Facility-Administered Medications for the 11/06/20 encounter (Office Visit) with [name], MD  Medication   0.9 %  sodium chloride infusion    Review of Systems  Constitutional:  Negative for chills, fatigue and fever.  Respiratory:  Negative for cough, chest tightness, shortness of breath and wheezing.   Cardiovascular:  Negative for chest pain, palpitations and leg swelling.  Neurological:  Negative for dizziness, light-headedness and headaches.   Objective:  BP (!) 100/50 (BP Location: Left Arm, Patient Position: Sitting, Cuff Size: Normal)   Pulse (!) 30   Temp 98.2 F (36.8 C) (Oral)   Ht '5\' 6"'$  (1.676 m)   Wt 119 lb 9.6 oz (54.3 kg)   LMP 03/02/1994 (Approximate)   SpO2 96%   BMI  19.30 kg/m   Weight: 119 lb 9.6 oz (54.3 kg)   BP Readings from Last 3 Encounters:  11/06/20 (!) 100/50  10/12/20 (!) 112/39  09/30/20 117/62   Wt Readings from Last 3 Encounters:  11/06/20 119 lb 9.6 oz (54.3 kg)  10/11/20 125 lb (56.7 kg)  09/30/20 123 lb (55.8 kg)    Physical Exam Constitutional:      General: She is not in acute distress.    Appearance: She is well-developed.  Cardiovascular:     Rate and Rhythm: Normal rate and regular rhythm. FrequentExtrasystoles are present.    Heart sounds: Normal heart sounds. No murmur heard.   No friction rub.  Pulmonary:     Effort: Pulmonary effort is normal. No respiratory distress.     Breath sounds: Normal breath sounds. No wheezing or rales.  Musculoskeletal:     Right lower leg: No edema.     Left lower leg: No edema.  Neurological:     Mental Status: She is alert and oriented to person, place, and time.  Psychiatric:        Behavior: Behavior normal.    Assessment/Plan  1. Bradycardia Initial heart rate recorded at 30, but this was done with pulse ox and patient was reliably in the 90's on exam and ekg. She does have frequent PAC, but these are stable from prior EKGs. No acute changes on ekg, normal rate.  - EKG 12-Lead  2. PAC (premature atrial contraction) See above  3. Need for immunization against influenza - Flu Vaccine QUAD High Dose(Fluad)  4. S/P ablation of atrial fibrillation She would like to stop her eliquis, but won't do it if increased risk of stroke. This was discussed with cardiology at her last visit; will reach out for any further guidance for patient.   Patient has physical set up for next month; ok to push this until December.    [name], MD

## 2020-11-07 ENCOUNTER — Other Ambulatory Visit: Payer: Self-pay | Admitting: *Deleted

## 2020-11-07 DIAGNOSIS — Z170 Estrogen receptor positive status [ER+]: Secondary | ICD-10-CM

## 2020-11-08 ENCOUNTER — Encounter: Payer: Self-pay | Admitting: Physical Therapy

## 2020-11-08 ENCOUNTER — Ambulatory Visit (INDEPENDENT_AMBULATORY_CARE_PROVIDER_SITE_OTHER): Payer: PPO | Admitting: Physical Therapy

## 2020-11-08 ENCOUNTER — Other Ambulatory Visit: Payer: Self-pay

## 2020-11-08 DIAGNOSIS — M25562 Pain in left knee: Secondary | ICD-10-CM

## 2020-11-08 DIAGNOSIS — R269 Unspecified abnormalities of gait and mobility: Secondary | ICD-10-CM

## 2020-11-08 DIAGNOSIS — M6281 Muscle weakness (generalized): Secondary | ICD-10-CM | POA: Diagnosis not present

## 2020-11-08 NOTE — Therapy (Signed)
[facility] [address] [place], [place], [address] Phone: [phone]   Fax:  [phone]  Physical Therapy Treatment  Patient Details  Name: Brittany [name] MRN: [id number] Date of Birth: 11/27/1945 Referring Provider (PT): Dr. Erlinda Hong, MD   Encounter Date: 11/08/2020   PT End of Session - 11/08/20 0940     Visit Number 6    Number of Visits 18    Date for PT Re-Evaluation 12/06/20    Authorization Type [employer]    PT Start Time [id number]    PT Stop Time 1012    PT Time Calculation (min) 41 min    Activity Tolerance Patient tolerated treatment well    Behavior During Therapy [facility] for tasks assessed/performed             Past Medical History:  Diagnosis Date   A-fib ([place])    Acid reflux    Acute cystitis with positive culture 03/06/2014   Anemia    Anxiety    Arthritis    Baker cyst, left 04/06/2016   Aspirated 04/06/2016   Breast cancer ([place]) 11/23/2007   right, ER/PR -, HER 2 -   Cognitive deficits 09/25/2014   2016 Very mild, could be related to meds    Degenerative arthritis of left knee 04/06/2016   Injection and aspiration 04/06/2016   Degenerative arthritis of right knee 09/27/2015   Depression    Esophageal stricture    Gastritis 10/2007   History of chemotherapy    neoadjuvant chemo, last dose 03/2008   History of radiation therapy 07/25/08 -09/11/08   right breast   Hx of colonic polyps 08/11/2016   Hyperlipidemia    Migraines    rare   Myalgia 07/18/2013   5/15 multiple site    OSA (obstructive sleep apnea)    severe OSA AHI 34, intolerant to PAP and now s/p hypoglossal nerve stimulator implant   Osteopenia    Rupture of quadriceps tendon 03/23/2017    Past Surgical History:  Procedure Laterality Date   ATRIAL FIBRILLATION ABLATION N/A 10/07/2018   Procedure: ATRIAL FIBRILLATION ABLATION;  Surgeon: [name], MD;  Location: [place] [facility];  Service: Cardiovascular;  Laterality: N/A;    BREAST LUMPECTOMY  06/26/2008   right, CA   XRT, chemo   COLONOSCOPY     DRUG INDUCED ENDOSCOPY N/A 02/21/2020   Procedure: DRUG INDUCED ENDOSCOPY;  Surgeon: Melida Quitter, MD;  Location: [place];  Service: ENT;  Laterality: N/A;   EYE SURGERY Bilateral    growth removed per right thigh     as teen - mole   IMPLANTATION OF HYPOGLOSSAL NERVE STIMULATOR N/A 05/15/2020   Procedure: IMPLANTATION OF HYPOGLOSSAL NERVE STIMULATOR;  Surgeon: Melida Quitter, MD;  [facility];  Service: ENT;  Laterality: N/A;   JOINT REPLACEMENT  12/31/2015   RTK   QUADRICEPS TENDON REPAIR Right 09/14/2016   Procedure: OPEN REPAIR QUADRICEP TENDON;  Surgeon: [name], MD;  Location: [facility];  Service: Orthopedics;  Laterality: Right;   TOTAL KNEE ARTHROPLASTY Right 12/31/2015   Procedure: RIGHT TOTAL KNEE ARTHROPLASTY;  Surgeon: [name], MD;  Location: [facility];  Service: Orthopedics;  Laterality: Right;   TOTAL KNEE ARTHROPLASTY Left 10/11/2020   Procedure: TOTAL KNEE ARTHROPLASTY;  Surgeon: [name], MD;  Location: [place];  Service: Orthopedics;  Laterality: Left;   UPPER GASTROINTESTINAL ENDOSCOPY      There were no vitals filed for this visit.   Subjective Assessment -  11/08/20 0935     Subjective Pt  stated no pain this morning, just stiffness. She reports it is stiff in the morning.    Pertinent History L TKR 10/11/20    Limitations Sitting    How long can you sit comfortably? indeterminate amount of time    How long can you stand comfortably? unknown    How long can you walk comfortably? unknown    Patient Stated Goals to be able to walk more normally    Currently in Pain? No/denies                               [facility] Adult PT Treatment/Exercise - 11/08/20 0001       Ambulation/Gait   Ambulation/Gait Yes    Ambulation/Gait Assistance 7: Independent      Neuro Re-ed    Neuro Re-ed Details  tandem ambulation on foam 8 ft fwd/rev x 6 each way in  bars (mild HHA), tandem stance 1 min x 1 bilateral on foam      Knee/Hip Exercises: Stretches   Gastroc Stretch 60 seconds;3 reps;Both   incline board     Knee/Hip Exercises: Aerobic   Recumbent Bike Lvl 3 7 mins      Knee/Hip Exercises: Machines for Strengthening   Cybex Knee Extension eccentric lowering 5 lbs 3 x 10 Lt    Cybex Leg Press 62 lbs 2 x 10 double leg, single 37 lbs 2 x 10 Lt      Knee/Hip Exercises: Standing   Forward Step Up 2 sets;10 reps;Step Height: 6"    Step Down Step Height: 4";Left;2 sets;10 reps   eccentric control focus   Step Down Limitations lateral step down      Manual Therapy   Manual therapy comments left knee extension stretch grade I and II PA and AP mobilization with distraction                     PT Education - 11/08/20 0940     Education Details reviewed HEP and symptom management    Person(s) Educated Patient    Methods Explanation;Demonstration;Tactile cues;Verbal cues    Comprehension Verbalized understanding;Returned demonstration;Verbal cues required;Tactile cues required              PT Short Term Goals - 10/28/20 0917       PT SHORT TERM GOAL #1   Title Pt will be indep with HEP    Baseline issued on 10/28/2020    Status On-going      PT SHORT TERM GOAL #2   Title Pt will be able to stand x 6 min to perform exercises without sitting    Status On-going      PT SHORT TERM GOAL #3   Title Pt will demo improved gait with heel strike, knee flexion, improved hip extension with LRAD x 5 min    Status On-going      PT SHORT TERM GOAL #4   Title -               PT Long Term Goals - 10/25/20 1100       PT LONG TERM GOAL #1   Title Pt will increase L LE strength >/=1 MMT grade to assist with improved energy efficiency    Baseline R hip flex 4+, knee ext 5/5, knee flex 4/5, DF 4-/5; L hip flex 2-/5, knee ext 2-/5, knee flex 3+/5, DF 4-/5  Time 6    Period Weeks    Status New    Target Date 12/06/20       PT LONG TERM GOAL #2   Title Pt will improve L knee flexion to lacking </=-5 degrees to assist with more normalized gt for shopping    Baseline L knee ext -15, flex 101,    Time 6    Period Weeks    Status New    Target Date 12/06/20      PT LONG TERM GOAL #3   Title Pt will improve L knee flexion to >/=115 degrees to assist with transfers    Baseline L knee ext -15, flex 101,    Time 6    Period Weeks    Status New    Target Date 12/06/20      PT LONG TERM GOAL #4   Title Pt will improve Foto score to 67% function    Baseline 49% function    Time 6    Period Weeks    Status New    Target Date 12/06/20                   Plan - 11/08/20 0956     Clinical Impression Statement Patient continues to progress well. She was lacking end range extension so therapy perfromedmanual therapy to improve extension. She tolerated ther-ex well. She had no increase in pain> Therapy will continue to progress as tolerated.    Comorbidities osteoporosis, hx of Breast CA, hx of Bakers cyst, R TKR    Examination-Activity Limitations Squat;Stairs;Locomotion Level;Stand    Examination-Participation Restrictions Cleaning;Shop;Community Activity;Meal Prep;Driving    Stability/Clinical Decision Making Stable/Uncomplicated    Clinical Decision Making Low    Rehab Potential Good    PT Frequency 3x / week    PT Duration 6 weeks    PT Treatment/Interventions ADLs/Self Care Home Management;Cryotherapy;Electrical Stimulation;DME Instruction;Gait training;Stair training;Balance training;Therapeutic exercise;Therapeutic activities;Functional mobility training;Neuromuscular re-education;Patient/family education;Manual techniques;Scar mobilization;Passive range of motion;Dry needling;Taping    PT Next Visit Plan Compliant surface balance, dynamic stability (lateral movements) continued progression of quad strengthening.    PT Home Exercise Plan Access Code: [email or web address]  URL:  [email or web address]  Date: 10/28/2020  Prepared by: Kearney Hard    Exercises  Long Sitting Quad Set with Towel Roll Under Heel - 3 x daily - 7 x weekly - 2 sets - 10 reps - 5 seconds hold  Supine Active Straight Leg Raise - 3 x daily - 7 x weekly - 2 sets - 10 reps  Heel Raises with Counter Support - 3 x daily - 7 x weekly - 2 sets - 10 reps  Sit to Stand - 3 x daily - 7 x weekly - 2 sets - 10 reps  Seated Long Arc Quad - 3 x daily - 7 x weekly - 2 sets - 10 reps - 5 seconds hold  Prone Knee Extension Hang - 3 x daily - 7 x weekly - start with 2 minutes and progress to 10 hold    Consulted and Agree with Plan of Care Patient             Patient will benefit from skilled therapeutic intervention in order to improve the following deficits and impairments:  Abnormal gait, Decreased endurance, Decreased mobility, Difficulty walking, Impaired sensation, Decreased scar mobility, Decreased range of motion, Decreased activity tolerance, Decreased strength, Impaired flexibility, Pain  Visit Diagnosis: Muscle weakness (generalized)  Abnormality of gait  Acute pain of left knee  Problem List Patient Active Problem List   Diagnosis Date Noted   Status post total left knee replacement 10/11/2020   Primary osteoarthritis of left knee 10/02/2020   Atrial fibrillation ([place]) 05/13/2018   Bloating 04/11/2018   Abnormal EKG 08/23/2017   Malignant neoplasm of overlapping sites of right breast in female, estrogen receptor positive ([place]) 08/17/2016   Hx of colonic polyps 08/11/2016   Iron deficiency anemia 07/28/2016   Blood donor 07/28/2016   Pain due to total right knee replacement ([place]) 04/06/2016   S/P right TKA 12/31/2015   Baker's cyst, unruptured 09/27/2015   Insomnia 07/31/2015   Onychomycosis 09/25/2014   Urinary frequency 05/21/2014   Mitral valve prolapse 05/08/2014   Mitral regurgitation 05/08/2014   Dyslipidemia 02/09/2014   History of radiation therapy     Hiatal hernia    History of chemotherapy    Osteoporosis 03/21/2012   Seasonal and perennial allergic rhinitis 12/17/2011   B12 deficiency 10/09/2009   TOBACCO USE, QUIT 01/31/2009   THYROID NODULE 12/25/2008   GERD 11/23/2007   VARICOSE VEIN 12/29/2006   Anxiety state 12/23/2006   Depression 12/23/2006   Migraine variant 12/23/2006   Disorder of bone and cartilage 12/23/2006    Carney Living, PT DPT  11/08/2020, 3:34 PM  [facility] [address] [place], [place], [address] Phone: [phone]   Fax:  [phone]  Name: Brittany [name] MRN: [id number] Date of Birth: 1946/01/08

## 2020-11-09 ENCOUNTER — Other Ambulatory Visit: Payer: Self-pay | Admitting: Family Medicine

## 2020-11-09 DIAGNOSIS — F3342 Major depressive disorder, recurrent, in full remission: Secondary | ICD-10-CM

## 2020-11-11 ENCOUNTER — Inpatient Hospital Stay: Payer: PPO | Admitting: Oncology

## 2020-11-11 ENCOUNTER — Inpatient Hospital Stay: Payer: PPO

## 2020-11-12 ENCOUNTER — Ambulatory Visit: Payer: PPO | Admitting: Rehabilitative and Restorative Service Providers"

## 2020-11-12 ENCOUNTER — Encounter: Payer: Self-pay | Admitting: Rehabilitative and Restorative Service Providers"

## 2020-11-12 ENCOUNTER — Encounter: Payer: PPO | Admitting: Physical Therapy

## 2020-11-12 ENCOUNTER — Other Ambulatory Visit: Payer: Self-pay

## 2020-11-12 DIAGNOSIS — M25562 Pain in left knee: Secondary | ICD-10-CM | POA: Diagnosis not present

## 2020-11-12 DIAGNOSIS — R269 Unspecified abnormalities of gait and mobility: Secondary | ICD-10-CM | POA: Diagnosis not present

## 2020-11-12 DIAGNOSIS — M6281 Muscle weakness (generalized): Secondary | ICD-10-CM | POA: Diagnosis not present

## 2020-11-12 NOTE — Therapy (Signed)
[facility] Physical Therapy [address] [place], [place], [address] Phone: [phone]   Fax:  [phone]  Physical Therapy Treatment  Patient Details  Name: Brittany [name] MRN: [id number] Date of Birth: [birth year] Referring Provider (PT): Dr. Erlinda Hong, MD   Encounter Date: 11/12/2020   PT End of Session - 11/12/20 0857     Visit Number 7    Number of Visits 18    Date for PT Re-Evaluation 12/06/20    Authorization Type [employer]    PT Start Time 0845    PT Stop Time 0924    PT Time Calculation (min) 39 min    Activity Tolerance Patient tolerated treatment well    Behavior During Therapy [facility] for tasks assessed/performed             Past Medical History:  Diagnosis Date   A-fib ([place])    Acid reflux    Acute cystitis with positive culture 03/06/2014   Anemia    Anxiety    Arthritis    Baker cyst, left 04/06/2016   Aspirated 04/06/2016   Breast cancer ([place]) 11/23/2007   right, ER/PR -, HER 2 -   Cognitive deficits 09/25/2014   2016 Very mild, could be related to meds    Degenerative arthritis of left knee 04/06/2016   Injection and aspiration 04/06/2016   Degenerative arthritis of right knee 09/27/2015   Depression    Esophageal stricture    Gastritis 10/2007   History of chemotherapy    neoadjuvant chemo, last dose 03/2008   History of radiation therapy 07/25/08 -09/11/08   right breast   Hx of colonic polyps 08/11/2016   Hyperlipidemia    Migraines    rare   Myalgia 07/18/2013   5/15 multiple site    OSA (obstructive sleep apnea)    severe OSA AHI 34, intolerant to PAP and now s/p hypoglossal nerve stimulator implant   Osteopenia    Rupture of quadriceps tendon 03/23/2017    Past Surgical History:  Procedure Laterality Date   ATRIAL FIBRILLATION ABLATION N/A 10/07/2018   Procedure: ATRIAL FIBRILLATION ABLATION;  Surgeon: [name], MD;  Location: [place] [facility];  Service: Cardiovascular;  Laterality: N/A;    BREAST LUMPECTOMY  06/26/2008   right, CA   XRT, chemo   COLONOSCOPY     DRUG INDUCED ENDOSCOPY N/A 02/21/2020   Procedure: DRUG INDUCED ENDOSCOPY;  Surgeon: Melida Quitter, MD;  Location: [place];  Service: ENT;  Laterality: N/A;   EYE SURGERY Bilateral    growth removed per right thigh     as teen - mole   IMPLANTATION OF HYPOGLOSSAL NERVE STIMULATOR N/A 05/15/2020   Procedure: IMPLANTATION OF HYPOGLOSSAL NERVE STIMULATOR;  Surgeon: Melida Quitter, MD;  [facility];  Service: ENT;  Laterality: N/A;   JOINT REPLACEMENT  12/31/2015   RTK   QUADRICEPS TENDON REPAIR Right 09/14/2016   Procedure: OPEN REPAIR QUADRICEP TENDON;  Surgeon: [name], MD;  Location: [facility];  Service: Orthopedics;  Laterality: Right;   TOTAL KNEE ARTHROPLASTY Right 12/31/2015   Procedure: RIGHT TOTAL KNEE ARTHROPLASTY;  Surgeon: [name], MD;  Location: [facility];  Service: Orthopedics;  Laterality: Right;   TOTAL KNEE ARTHROPLASTY Left 10/11/2020   Procedure: TOTAL KNEE ARTHROPLASTY;  Surgeon: [name], MD;  Location: [place];  Service: Orthopedics;  Laterality: Left;   UPPER GASTROINTESTINAL ENDOSCOPY      There were no vitals filed for this visit.   Subjective Assessment -  11/12/20 0848     Subjective Pt. stated complaints upon waking Sunday morning in middle to distal Lt anterior thigh, noted more c trying to lift leg.  Pt. indicated no MOI.  Pt. stated 4-5/10 or so today.    Pertinent History L TKR 10/11/20    Limitations Sitting    How long can you sit comfortably? indeterminate amount of time    How long can you stand comfortably? unknown    How long can you walk comfortably? unknown    Patient Stated Goals to be able to walk more normally    Pain Score 5     Pain Location Knee    Pain Orientation Left    Pain Descriptors / Indicators Aching;Sore    Pain Type Surgical pain    Pain Onset More than a month ago    Pain Frequency Intermittent    Aggravating Factors  lifting  leg    Pain Relieving Factors trying to rest                               [facility] Adult PT Treatment/Exercise - 11/12/20 0001       Knee/Hip Exercises: Stretches   Passive Hamstring Stretch 30 seconds;3 reps;Left   on shuttle leg press after sets   Gastroc Stretch 60 seconds;3 reps;Both   incline board     Knee/Hip Exercises: Aerobic   Recumbent Bike Lvl 2 10 mins, Seat 6      Knee/Hip Exercises: Machines for Strengthening   Cybex Leg Press double leg 50 lbs 2 x 15, Lt single leg 31 lbs 2 x 15      Knee/Hip Exercises: Supine   Other Supine Knee/Hip Exercises supine thomas stretch Lt 15 sec x 5                       PT Short Term Goals - 11/12/20 0924       PT SHORT TERM GOAL #1   Title Pt will be indep with HEP    Status Achieved      PT SHORT TERM GOAL #2   Title Pt will be able to stand x 6 min to perform exercises without sitting    Status On-going      PT SHORT TERM GOAL #3   Title Pt will demo improved gait with heel strike, knee flexion, improved hip extension with LRAD x 5 min    Status Achieved      PT SHORT TERM GOAL #4   Title -               PT Long Term Goals - 10/25/20 1100       PT LONG TERM GOAL #1   Title Pt will increase L LE strength >/=1 MMT grade to assist with improved energy efficiency    Baseline R hip flex 4+, knee ext 5/5, knee flex 4/5, DF 4-/5; L hip flex 2-/5, knee ext 2-/5, knee flex 3+/5, DF 4-/5    Time 6    Period Weeks    Status New    Target Date 12/06/20      PT LONG TERM GOAL #2   Title Pt will improve L knee flexion to lacking </=-5 degrees to assist with more normalized gt for shopping    Baseline L knee ext -15, flex 101,    Time 6    Period Weeks    Status New  Target Date 12/06/20      PT LONG TERM GOAL #3   Title Pt will improve L knee flexion to >/=115 degrees to assist with transfers    Baseline L knee ext -15, flex 101,    Time 6    Period Weeks    Status New     Target Date 12/06/20      PT LONG TERM GOAL #4   Title Pt will improve Foto score to 67% function    Baseline 49% function    Time 6    Period Weeks    Status New    Target Date 12/06/20                   Plan - 11/12/20 0903     Clinical Impression Statement Complaints of symptoms primarily noted c active hip flexion, knee extension involving rectus femoris recruitment.  Adjusted intervention to accomdate and will return to progress as tolerated.  Continued skilled PT services indicated at this time.    Comorbidities osteoporosis, hx of Breast CA, hx of Bakers cyst, R TKR    Examination-Activity Limitations Squat;Stairs;Locomotion Level;Stand    Examination-Participation Restrictions Cleaning;Shop;Community Activity;Meal Prep;Driving    Stability/Clinical Decision Making Stable/Uncomplicated    Rehab Potential Good    PT Frequency 3x / week    PT Duration 6 weeks    PT Treatment/Interventions ADLs/Self Care Home Management;Cryotherapy;Electrical Stimulation;DME Instruction;Gait training;Stair training;Balance training;Therapeutic exercise;Therapeutic activities;Functional mobility training;Neuromuscular re-education;Patient/family education;Manual techniques;Scar mobilization;Passive range of motion;Dry needling;Taping    PT Next Visit Plan Reassess symptoms and thigh complaints.  Progress to return to program of strengthening and balance as able.    PT Home Exercise Plan Access Code: [email or web address]  URL: [email or web address]  Date: 10/28/2020  Prepared by: Kearney Hard    Exercises  Long Sitting Quad Set with Towel Roll Under Heel - 3 x daily - 7 x weekly - 2 sets - 10 reps - 5 seconds hold  Supine Active Straight Leg Raise - 3 x daily - 7 x weekly - 2 sets - 10 reps  Heel Raises with Counter Support - 3 x daily - 7 x weekly - 2 sets - 10 reps  Sit to Stand - 3 x daily - 7 x weekly - 2 sets - 10 reps  Seated Long Arc Quad - 3 x daily - 7 x weekly - 2 sets - 10 reps - 5  seconds hold  Prone Knee Extension Hang - 3 x daily - 7 x weekly - start with 2 minutes and progress to 10 hold    Consulted and Agree with Plan of Care Patient             Patient will benefit from skilled therapeutic intervention in order to improve the following deficits and impairments:  Abnormal gait, Decreased endurance, Decreased mobility, Difficulty walking, Impaired sensation, Decreased scar mobility, Decreased range of motion, Decreased activity tolerance, Decreased strength, Impaired flexibility, Pain  Visit Diagnosis: Muscle weakness (generalized)  Abnormality of gait  Acute pain of left knee     Problem List Patient Active Problem List   Diagnosis Date Noted   Status post total left knee replacement 10/11/2020   Primary osteoarthritis of left knee 10/02/2020   Atrial fibrillation ([place]) 05/13/2018   Bloating 04/11/2018   Abnormal EKG 08/23/2017   Malignant neoplasm of overlapping sites of right breast in female, estrogen receptor positive ([place]) 08/17/2016   Hx of colonic polyps 08/11/2016   Iron deficiency anemia 07/28/2016  Blood donor 07/28/2016   Pain due to total right knee replacement ([place]) 04/06/2016   S/P right TKA 12/31/2015   Baker's cyst, unruptured 09/27/2015   Insomnia 07/31/2015   Onychomycosis 09/25/2014   Urinary frequency 05/21/2014   Mitral valve prolapse 05/08/2014   Mitral regurgitation 05/08/2014   Dyslipidemia 02/09/2014   History of radiation therapy    Hiatal hernia    History of chemotherapy    Osteoporosis 03/21/2012   Seasonal and perennial allergic rhinitis 12/17/2011   B12 deficiency 10/09/2009   TOBACCO USE, QUIT 01/31/2009   THYROID NODULE 12/25/2008   GERD 11/23/2007   VARICOSE VEIN 12/29/2006   Anxiety state 12/23/2006   Depression 12/23/2006   Migraine variant 12/23/2006   Disorder of bone and cartilage 12/23/2006    Scot Jun, PT, DPT, OCS, ATC 11/12/20  9:25 AM   [facility] [address] [place], [place], [address] Phone: [phone]   Fax:  [phone]  Name: Brittany [name] MRN: [id number] Date of Birth: 1945-04-17

## 2020-11-14 ENCOUNTER — Encounter: Payer: Self-pay | Admitting: Rehabilitative and Restorative Service Providers"

## 2020-11-14 ENCOUNTER — Other Ambulatory Visit: Payer: Self-pay

## 2020-11-14 ENCOUNTER — Ambulatory Visit: Payer: PPO | Admitting: Rehabilitative and Restorative Service Providers"

## 2020-11-14 DIAGNOSIS — R269 Unspecified abnormalities of gait and mobility: Secondary | ICD-10-CM | POA: Diagnosis not present

## 2020-11-14 DIAGNOSIS — M6281 Muscle weakness (generalized): Secondary | ICD-10-CM | POA: Diagnosis not present

## 2020-11-14 DIAGNOSIS — M25562 Pain in left knee: Secondary | ICD-10-CM | POA: Diagnosis not present

## 2020-11-14 NOTE — Therapy (Signed)
[facility] Physical Therapy [address] [place], [place], [address] Phone: [phone]   Fax:  [phone]  Physical Therapy Treatment  Patient Details  Name: Brittany [name] MRN: [id number] Date of Birth: 1945-12-12 Referring Provider (PT): Dr. Erlinda Hong, MD   Encounter Date: 11/14/2020   PT End of Session - 11/14/20 0807     Visit Number 8    Number of Visits 18    Date for PT Re-Evaluation 12/06/20    Authorization Type [employer]    PT Start Time 0802    PT Stop Time [id number]    PT Time Calculation (min) 39 min    Activity Tolerance Patient tolerated treatment well    Behavior During Therapy [facility] for tasks assessed/performed             Past Medical History:  Diagnosis Date   A-fib ([place])    Acid reflux    Acute cystitis with positive culture 03/06/2014   Anemia    Anxiety    Arthritis    Baker cyst, left 04/06/2016   Aspirated 04/06/2016   Breast cancer ([place]) 11/23/2007   right, ER/PR -, HER 2 -   Cognitive deficits 09/25/2014   2016 Very mild, could be related to meds    Degenerative arthritis of left knee 04/06/2016   Injection and aspiration 04/06/2016   Degenerative arthritis of right knee 09/27/2015   Depression    Esophageal stricture    Gastritis 10/2007   History of chemotherapy    neoadjuvant chemo, last dose 03/2008   History of radiation therapy 07/25/08 -09/11/08   right breast   Hx of colonic polyps 08/11/2016   Hyperlipidemia    Migraines    rare   Myalgia 07/18/2013   5/15 multiple site    OSA (obstructive sleep apnea)    severe OSA AHI 34, intolerant to PAP and now s/p hypoglossal nerve stimulator implant   Osteopenia    Rupture of quadriceps tendon 03/23/2017    Past Surgical History:  Procedure Laterality Date   ATRIAL FIBRILLATION ABLATION N/A 10/07/2018   Procedure: ATRIAL FIBRILLATION ABLATION;  Surgeon: [name], MD;  Location: [place] [facility];  Service: Cardiovascular;  Laterality: N/A;    BREAST LUMPECTOMY  06/26/2008   right, CA   XRT, chemo   COLONOSCOPY     DRUG INDUCED ENDOSCOPY N/A 02/21/2020   Procedure: DRUG INDUCED ENDOSCOPY;  Surgeon: Melida Quitter, MD;  Location: [place];  Service: ENT;  Laterality: N/A;   EYE SURGERY Bilateral    growth removed per right thigh     as teen - mole   IMPLANTATION OF HYPOGLOSSAL NERVE STIMULATOR N/A 05/15/2020   Procedure: IMPLANTATION OF HYPOGLOSSAL NERVE STIMULATOR;  Surgeon: Melida Quitter, MD;  Location: [facility];  Service: ENT;  Laterality: N/A;   JOINT REPLACEMENT  12/31/2015   RTK   QUADRICEPS TENDON REPAIR Right 09/14/2016   Procedure: OPEN REPAIR QUADRICEP TENDON;  Surgeon: [name], MD;  Location: [facility];  Service: Orthopedics;  Laterality: Right;   TOTAL KNEE ARTHROPLASTY Right 12/31/2015   Procedure: RIGHT TOTAL KNEE ARTHROPLASTY;  Surgeon: [name], MD;  Location: [facility];  Service: Orthopedics;  Laterality: Right;   TOTAL KNEE ARTHROPLASTY Left 10/11/2020   Procedure: TOTAL KNEE ARTHROPLASTY;  Surgeon: [name], MD;  Location: [place];  Service: Orthopedics;  Laterality: Left;   UPPER GASTROINTESTINAL ENDOSCOPY      There were no vitals filed for this visit.   Subjective Assessment -  11/14/20 0803     Subjective Pt. indicated she felt better after last visit but then later felt the pain in thigh return.  She tried moving more at home and it seemed to help.  Tightness noted in morning.  Pt. stated no pain upon arrival today.    Pertinent History L TKR 10/11/20    Limitations Sitting    Patient Stated Goals to be able to walk more normally    Currently in Pain? No/denies    Pain Score 0-No pain    Pain Location Knee    Pain Orientation Left    Pain Descriptors / Indicators Aching;Tightness;Sore    Pain Type Surgical pain    Pain Onset More than a month ago    Pain Frequency Intermittent    Aggravating Factors  worse at night/morning    Pain Relieving Factors moveent                                [facility] Adult PT Treatment/Exercise - 11/14/20 0001       Neuro Re-ed    Neuro Re-ed Details  single leg stance c cone touch (anterior, anterior-medial, anterior-lateral) x 8 each bilateral, fitter wobble board fwd/back ankle stategy control x30      Knee/Hip Exercises: Stretches   Gastroc Stretch 60 seconds;2 reps;Both   incline board     Knee/Hip Exercises: Aerobic   Recumbent Bike Lvl 3 10 mins, seat 6      Knee/Hip Exercises: Machines for Strengthening   Cybex Knee Extension eccentric lowering 10 lbs 2 x 10 on Lt    Cybex Knee Flexion Lt SL x 10 10 lbs, x 10 15 lbs    Cybex Leg Press double leg 62 lbs 2 x 15, Lt single leg 31 lbs 2 x 15      Knee/Hip Exercises: Standing   Step Down Step Height: 4";2 sets;10 reps;Left   eccentric lowering focus   Step Down Limitations lateral step down                       PT Short Term Goals - 11/14/20 0831       PT SHORT TERM GOAL #1   Title Pt will be indep with HEP    Status Achieved      PT SHORT TERM GOAL #2   Title Pt will be able to stand x 6 min to perform exercises without sitting    Status Achieved      PT SHORT TERM GOAL #3   Title Pt will demo improved gait with heel strike, knee flexion, improved hip extension with LRAD x 5 min    Status Achieved      PT SHORT TERM GOAL #4   Title -               PT Long Term Goals - 11/14/20 0831       PT LONG TERM GOAL #1   Title Pt will increase Lt LE strength >/=1 MMT grade to assist with improved energy efficiency    Time 6    Period Weeks    Status On-going    Target Date 12/06/20      PT LONG TERM GOAL #2   Title Pt will improve Lt knee flexion to lacking </=-5 degrees to assist with more normalized gt for shopping    Time 6    Period Weeks  Status On-going    Target Date 12/06/20      PT LONG TERM GOAL #3   Title Pt will improve Lt knee flexion to >/=115 degrees to assist with transfers    Time 6     Period Weeks    Status On-going    Target Date 12/06/20      PT LONG TERM GOAL #4   Title Pt will improve Foto score to 67% function    Time 6    Period Weeks    Status On-going    Target Date 12/06/20                   Plan - 11/14/20 [id number]     Clinical Impression Statement Indications of recovery for thigh symptoms noted from last visit.  Resumption of quad strengthening with no pain complaints indicated.  Fair control in SLS activity.  Continued skilled PT services warrented.    Comorbidities osteoporosis, hx of Breast CA, hx of Bakers cyst, R TKR    Examination-Activity Limitations Squat;Stairs;Locomotion Level;Stand    Examination-Participation Restrictions Cleaning;Shop;Community Activity;Meal Prep;Driving    Stability/Clinical Decision Making Stable/Uncomplicated    Rehab Potential Good    PT Frequency 3x / week    PT Duration 6 weeks    PT Treatment/Interventions ADLs/Self Care Home Management;Cryotherapy;Electrical Stimulation;DME Instruction;Gait training;Stair training;Balance training;Therapeutic exercise;Therapeutic activities;Functional mobility training;Neuromuscular re-education;Patient/family education;Manual techniques;Scar mobilization;Passive range of motion;Dry needling;Taping    PT Next Visit Plan Progress note, FOTO in next visit or two.  Continued static and dynamic balance, quad strengthening.    PT Home Exercise Plan Access Code: [email or web address]  URL: [email or web address]  Date: 10/28/2020  Prepared by: Kearney Hard    Exercises  Long Sitting Quad Set with Towel Roll Under Heel - 3 x daily - 7 x weekly - 2 sets - 10 reps - 5 seconds hold  Supine Active Straight Leg Raise - 3 x daily - 7 x weekly - 2 sets - 10 reps  Heel Raises with Counter Support - 3 x daily - 7 x weekly - 2 sets - 10 reps  Sit to Stand - 3 x daily - 7 x weekly - 2 sets - 10 reps  Seated Long Arc Quad - 3 x daily - 7 x weekly - 2 sets - 10 reps - 5 seconds hold  Prone Knee  Extension Hang - 3 x daily - 7 x weekly - start with 2 minutes and progress to 10 hold    Consulted and Agree with Plan of Care Patient             Patient will benefit from skilled therapeutic intervention in order to improve the following deficits and impairments:  Abnormal gait, Decreased endurance, Decreased mobility, Difficulty walking, Impaired sensation, Decreased scar mobility, Decreased range of motion, Decreased activity tolerance, Decreased strength, Impaired flexibility, Pain  Visit Diagnosis: Muscle weakness (generalized)  Abnormality of gait  Acute pain of left knee     Problem List Patient Active Problem List   Diagnosis Date Noted   Status post total left knee replacement 10/11/2020   Primary osteoarthritis of left knee 10/02/2020   Atrial fibrillation ([place]) 05/13/2018   Bloating 04/11/2018   Abnormal EKG 08/23/2017   Malignant neoplasm of overlapping sites of right breast in female, estrogen receptor positive ([place]) 08/17/2016   Hx of colonic polyps 08/11/2016   Iron deficiency anemia 07/28/2016   Blood donor 07/28/2016   Pain due to total right knee replacement ([place]) 04/06/2016  S/P right TKA 12/31/2015   Baker's cyst, unruptured 09/27/2015   Insomnia 07/31/2015   Onychomycosis 09/25/2014   Urinary frequency 05/21/2014   Mitral valve prolapse 05/08/2014   Mitral regurgitation 05/08/2014   Dyslipidemia 02/09/2014   History of radiation therapy    Hiatal hernia    History of chemotherapy    Osteoporosis 03/21/2012   Seasonal and perennial allergic rhinitis 12/17/2011   B12 deficiency 10/09/2009   TOBACCO USE, QUIT 01/31/2009   THYROID NODULE 12/25/2008   GERD 11/23/2007   VARICOSE VEIN 12/29/2006   Anxiety state 12/23/2006   Depression 12/23/2006   Migraine variant 12/23/2006   Disorder of bone and cartilage 12/23/2006    Scot Jun, PT, DPT, OCS, ATC 11/14/20  8:35 AM    [facility] Physical Therapy [address] [place], [place], [address] Phone: [phone]   Fax:  [phone]  Name: Brittany [name] MRN: [id number] Date of Birth: 26-Apr-1945

## 2020-11-18 ENCOUNTER — Ambulatory Visit: Payer: PPO | Admitting: Physical Therapy

## 2020-11-18 ENCOUNTER — Telehealth: Payer: Self-pay | Admitting: Family Medicine

## 2020-11-18 ENCOUNTER — Other Ambulatory Visit: Payer: Self-pay

## 2020-11-18 DIAGNOSIS — M25562 Pain in left knee: Secondary | ICD-10-CM | POA: Diagnosis not present

## 2020-11-18 DIAGNOSIS — M6281 Muscle weakness (generalized): Secondary | ICD-10-CM

## 2020-11-18 DIAGNOSIS — R269 Unspecified abnormalities of gait and mobility: Secondary | ICD-10-CM | POA: Diagnosis not present

## 2020-11-18 NOTE — Therapy (Signed)
[facility] [address] [place], [place], [address] Phone: [phone]   Fax:  [phone]  Physical Therapy Treatment  Patient Details  Name: Brittany [name] MRN: [id number] Date of Birth: April 16, 1945 Referring Provider (PT): Dr. Erlinda Hong   Encounter Date: 11/18/2020   PT End of Session - 11/18/20 0943     Visit Number 9    Number of Visits 18    Date for PT Re-Evaluation 12/06/20    Authorization Type [employer]    Progress Note Due on Visit 10    PT Start Time 0928    PT Stop Time 1010    PT Time Calculation (min) 42 min    Activity Tolerance Patient tolerated treatment well    Behavior During Therapy [facility] for tasks assessed/performed             Past Medical History:  Diagnosis Date   A-fib ([place])    Acid reflux    Acute cystitis with positive culture 03/06/2014   Anemia    Anxiety    Arthritis    Baker cyst, left 04/06/2016   Aspirated 04/06/2016   Breast cancer ([place]) 11/23/2007   right, ER/PR -, HER 2 -   Cognitive deficits 09/25/2014   2016 Very mild, could be related to meds    Degenerative arthritis of left knee 04/06/2016   Injection and aspiration 04/06/2016   Degenerative arthritis of right knee 09/27/2015   Depression    Esophageal stricture    Gastritis 10/2007   History of chemotherapy    neoadjuvant chemo, last dose 03/2008   History of radiation therapy 07/25/08 -09/11/08   right breast   Hx of colonic polyps 08/11/2016   Hyperlipidemia    Migraines    rare   Myalgia 07/18/2013   5/15 multiple site    OSA (obstructive sleep apnea)    severe OSA AHI 34, intolerant to PAP and now s/p hypoglossal nerve stimulator implant   Osteopenia    Rupture of quadriceps tendon 03/23/2017    Past Surgical History:  Procedure Laterality Date   ATRIAL FIBRILLATION ABLATION N/A 10/07/2018   Procedure: ATRIAL FIBRILLATION ABLATION;  Surgeon: [name], MD;  Location: [place] [facility];  Service:  Cardiovascular;  Laterality: N/A;   BREAST LUMPECTOMY  06/26/2008   right, CA   XRT, chemo   COLONOSCOPY     DRUG INDUCED ENDOSCOPY N/A 02/21/2020   Procedure: DRUG INDUCED ENDOSCOPY;  Surgeon: Melida Quitter, MD;  Location: [facility] [place];  Service: ENT;  Laterality: N/A;   EYE SURGERY Bilateral    growth removed per right thigh     as teen - mole   IMPLANTATION OF HYPOGLOSSAL NERVE STIMULATOR N/A 05/15/2020   Procedure: IMPLANTATION OF HYPOGLOSSAL NERVE STIMULATOR;  Surgeon: Melida Quitter, MD;  Location: [facility];  Service: ENT;  Laterality: N/A;   JOINT REPLACEMENT  12/31/2015   RTK   QUADRICEPS TENDON REPAIR Right 09/14/2016   Procedure: OPEN REPAIR QUADRICEP TENDON;  Surgeon: [name], MD;  [facility]: [facility];  Service: Orthopedics;  Laterality: Right;   TOTAL KNEE ARTHROPLASTY Right 12/31/2015   Procedure: RIGHT TOTAL KNEE ARTHROPLASTY;  Surgeon: [name], MD;  Location: [facility];  Service: Orthopedics;  Laterality: Right;   TOTAL KNEE ARTHROPLASTY Left 10/11/2020   Procedure: TOTAL KNEE ARTHROPLASTY;  Surgeon: [name], MD;  Location: [place];  Service: Orthopedics;  Laterality: Left;   UPPER GASTROINTESTINAL ENDOSCOPY      There were no vitals filed  for this visit.   Subjective Assessment - 11/18/20 0940     Subjective Pt arriving today reporting 3/10 pain in left knee. Pt reporting tightness first thing in the morning when waking up.    How long can you sit comfortably? indeterminate amount of time    Patient Stated Goals to be able to walk more normally    Currently in Pain? Yes    Pain Location Knee    Pain Orientation Left    Pain Descriptors / Indicators Aching;Tightness    Pain Type Surgical pain                [facility] PT Assessment - 11/18/20 0001       Assessment   Medical Diagnosis Left TKA    Referring Provider (PT) Dr. Erlinda Hong    Onset Date/Surgical Date 10/11/20      AROM   Right/Left Knee Left    Left Knee Extension -6    Left Knee  Flexion 120      PROM   PROM Assessment Site Knee    Right/Left Knee Left    Left Knee Extension -5    Left Knee Flexion 122                           [facility] Adult PT Treatment/Exercise - 11/18/20 0001       Neuro Re-ed    Neuro Re-ed Details  SLS: ariex mat 5 trials with intermittent UE support as needed, rocker board x 1 minute each direction, side stepping x 20 feet each direciton,  tandum walking 20 feet x 2, braiding walking 20 feet (pt needed CGA for tandum walking and SLS on airex)      Knee/Hip Exercises: Stretches   Gastroc Stretch 60 seconds;2 reps;Both   incline board     Knee/Hip Exercises: Aerobic   Recumbent Bike Bike/UBE No UE, L4 x 10 minutes      Knee/Hip Exercises: Machines for Strengthening   Cybex Knee Extension eccentric lowering 10 lbs 2 x 10 on Lt    Cybex Knee Flexion Lt SL x 10 10 lbs, x 10 15 lbs    Cybex Leg Press Double leg: 75# 3x10, left leg only: 37# 2x10      Knee/Hip Exercises: Standing   Step Down Step Height: 4";2 sets;10 reps;Left   eccentric lowering focus   Step Down Limitations lateral step down                       PT Short Term Goals - 11/18/20 0952       PT SHORT TERM GOAL #1   Title Pt will be indep with HEP    Status Achieved      PT SHORT TERM GOAL #2   Title Pt will be able to stand x 6 min to perform exercises without sitting    Status Achieved      PT SHORT TERM GOAL #3   Title Pt will demo improved gait with heel strike, knee flexion, improved hip extension with LRAD x 5 min    Status Achieved               PT Long Term Goals - 11/18/20 8841       PT LONG TERM GOAL #1   Title Pt will increase Lt LE strength >/=1 MMT grade to assist with improved energy efficiency    Status On-going  PT LONG TERM GOAL #2   Title Pt will improve Lt knee flexion to lacking </=-5 degrees to assist with more normalized gt for shopping    Baseline left knee: 5-122 passively, 6-120 actively     Status On-going      PT LONG TERM GOAL #3   Title Pt will improve Lt knee flexion to >/=115 degrees to assist with transfers    Baseline left knee 120 degrees actively    Status Achieved      PT LONG TERM GOAL #4   Title Pt will improve Foto score to 67% function    Status On-going                   Plan - 11/18/20 0946     Clinical Impression Statement Pt tolerating exercises well. Pt reporting 3/10 pain upon arrival and no reports of increased pain during session. Pt is making progress with increased resistance and progressing with balance. Continue skilled PT to maximize function. Progress Note and FOTO due next visit.    Personal Factors and Comorbidities Comorbidity 3+    Comorbidities osteoporosis, hx of Breast CA, hx of Bakers cyst, R TKR    Examination-Activity Limitations Squat;Stairs;Locomotion Level;Stand    Examination-Participation Restrictions Cleaning;Shop;Community Activity;Meal Prep;Driving    Stability/Clinical Decision Making Stable/Uncomplicated    Rehab Potential Good    PT Frequency 3x / week    PT Duration 6 weeks    PT Treatment/Interventions ADLs/Self Care Home Management;Cryotherapy;Electrical Stimulation;DME Instruction;Gait training;Stair training;Balance training;Therapeutic exercise;Therapeutic activities;Functional mobility training;Neuromuscular re-education;Patient/family education;Manual techniques;Scar mobilization;Passive range of motion;Dry needling;Taping    PT Next Visit Plan Progress Note and FOTO next visit. Continued static and dynamic balance, quad strengthening.    PT Home Exercise Plan Access Code: [email or web address]  URL: [email or web address]  Date: 10/28/2020  Prepared by: Kearney Hard    Exercises  Long Sitting Quad Set with Towel Roll Under Heel - 3 x daily - 7 x weekly - 2 sets - 10 reps - 5 seconds hold  Supine Active Straight Leg Raise - 3 x daily - 7 x weekly - 2 sets - 10 reps  Heel Raises with Counter Support - 3 x  daily - 7 x weekly - 2 sets - 10 reps  Sit to Stand - 3 x daily - 7 x weekly - 2 sets - 10 reps  Seated Long Arc Quad - 3 x daily - 7 x weekly - 2 sets - 10 reps - 5 seconds hold  Prone Knee Extension Hang - 3 x daily - 7 x weekly - start with 2 minutes and progress to 10 hold    Consulted and Agree with Plan of Care Patient             Patient will benefit from skilled therapeutic intervention in order to improve the following deficits and impairments:  Abnormal gait, Decreased endurance, Decreased mobility, Difficulty walking, Impaired sensation, Decreased scar mobility, Decreased range of motion, Decreased activity tolerance, Decreased strength, Impaired flexibility, Pain  Visit Diagnosis: Muscle weakness (generalized)  Abnormality of gait  Acute pain of left knee     Problem List Patient Active Problem List   Diagnosis Date Noted   Status post total left knee replacement 10/11/2020   Primary osteoarthritis of left knee 10/02/2020   Atrial fibrillation ([place]) 05/13/2018   Bloating 04/11/2018   Abnormal EKG 08/23/2017   Malignant neoplasm of overlapping sites of right breast in female, estrogen receptor positive ([place]) 08/17/2016   Hx of colonic  polyps 08/11/2016   Iron deficiency anemia 07/28/2016   Blood donor 07/28/2016   Pain due to total right knee replacement ([place]) 04/06/2016   S/P right TKA 12/31/2015   Baker's cyst, unruptured 09/27/2015   Insomnia 07/31/2015   Onychomycosis 09/25/2014   Urinary frequency 05/21/2014   Mitral valve prolapse 05/08/2014   Mitral regurgitation 05/08/2014   Dyslipidemia 02/09/2014   History of radiation therapy    Hiatal hernia    History of chemotherapy    Osteoporosis 03/21/2012   Seasonal and perennial allergic rhinitis 12/17/2011   B12 deficiency 10/09/2009   TOBACCO USE, QUIT 01/31/2009   THYROID NODULE 12/25/2008   GERD 11/23/2007   VARICOSE VEIN 12/29/2006   Anxiety state 12/23/2006   Depression 12/23/2006   Migraine  variant 12/23/2006   Disorder of bone and cartilage 12/23/2006    Oretha Caprice, PT, MPT 11/18/2020, 10:08 AM  [facility] [address] [place], [place], [address] Phone: [phone]   Fax:  [phone]  Name: Brittany [name] MRN: [id number] Date of Birth: 1945-10-29

## 2020-11-18 NOTE — Chronic Care Management (AMB) (Signed)
  Chronic Care Management   Note  11/18/2020 Name: Brittany [name] MRN: [id number] DOB: 06-Nov-1945  Brittany [name] is a 75 y.o. year old female who is a primary care patient of [name], [name], MD. I reached out to Brittany [name] by phone today in response to a referral sent by Ms. Waynard Reeds PCP, [name], MD.   Ms. Andaya was given information about Chronic Care Management services today including:  CCM service includes personalized support from designated clinical staff supervised by her physician, including individualized plan of care and coordination with other care providers 24/7 contact phone numbers for assistance for urgent and routine care needs. Service will only be billed when office clinical staff spend 20 minutes or more in a month to coordinate care. Only one practitioner may furnish and bill the service in a calendar month. The patient may stop CCM services at any time (effective at the end of the month) by phone call to the office staff.   Patient agreed to services and verbal consent obtained.   Follow up plan:  Tatjana Secretary/administrator

## 2020-11-20 ENCOUNTER — Inpatient Hospital Stay: Payer: PPO

## 2020-11-20 ENCOUNTER — Encounter: Payer: Self-pay | Admitting: Rehabilitative and Restorative Service Providers"

## 2020-11-20 ENCOUNTER — Ambulatory Visit: Payer: PPO | Admitting: Rehabilitative and Restorative Service Providers"

## 2020-11-20 ENCOUNTER — Inpatient Hospital Stay: Payer: PPO | Admitting: Oncology

## 2020-11-20 ENCOUNTER — Other Ambulatory Visit: Payer: Self-pay

## 2020-11-20 ENCOUNTER — Inpatient Hospital Stay: Payer: PPO | Attending: Oncology

## 2020-11-20 VITALS — BP 118/53 | HR 99 | Temp 97.6°F | Resp 18 | Ht 66.0 in | Wt 127.2 lb

## 2020-11-20 DIAGNOSIS — Z801 Family history of malignant neoplasm of trachea, bronchus and lung: Secondary | ICD-10-CM | POA: Insufficient documentation

## 2020-11-20 DIAGNOSIS — Z7901 Long term (current) use of anticoagulants: Secondary | ICD-10-CM | POA: Diagnosis not present

## 2020-11-20 DIAGNOSIS — Z820 Family history of epilepsy and other diseases of the nervous system: Secondary | ICD-10-CM | POA: Insufficient documentation

## 2020-11-20 DIAGNOSIS — I4891 Unspecified atrial fibrillation: Secondary | ICD-10-CM | POA: Diagnosis not present

## 2020-11-20 DIAGNOSIS — C50811 Malignant neoplasm of overlapping sites of right female breast: Secondary | ICD-10-CM

## 2020-11-20 DIAGNOSIS — Z170 Estrogen receptor positive status [ER+]: Secondary | ICD-10-CM | POA: Diagnosis not present

## 2020-11-20 DIAGNOSIS — Z8719 Personal history of other diseases of the digestive system: Secondary | ICD-10-CM | POA: Diagnosis not present

## 2020-11-20 DIAGNOSIS — Z923 Personal history of irradiation: Secondary | ICD-10-CM | POA: Insufficient documentation

## 2020-11-20 DIAGNOSIS — M6281 Muscle weakness (generalized): Secondary | ICD-10-CM | POA: Diagnosis not present

## 2020-11-20 DIAGNOSIS — M810 Age-related osteoporosis without current pathological fracture: Secondary | ICD-10-CM | POA: Diagnosis not present

## 2020-11-20 DIAGNOSIS — Z171 Estrogen receptor negative status [ER-]: Secondary | ICD-10-CM | POA: Diagnosis not present

## 2020-11-20 DIAGNOSIS — Z79899 Other long term (current) drug therapy: Secondary | ICD-10-CM | POA: Insufficient documentation

## 2020-11-20 DIAGNOSIS — Z7289 Other problems related to lifestyle: Secondary | ICD-10-CM | POA: Insufficient documentation

## 2020-11-20 DIAGNOSIS — Z87891 Personal history of nicotine dependence: Secondary | ICD-10-CM | POA: Diagnosis not present

## 2020-11-20 DIAGNOSIS — R269 Unspecified abnormalities of gait and mobility: Secondary | ICD-10-CM

## 2020-11-20 DIAGNOSIS — Z818 Family history of other mental and behavioral disorders: Secondary | ICD-10-CM | POA: Insufficient documentation

## 2020-11-20 DIAGNOSIS — Z888 Allergy status to other drugs, medicaments and biological substances status: Secondary | ICD-10-CM | POA: Diagnosis not present

## 2020-11-20 DIAGNOSIS — Z800 Family history of malignant neoplasm of digestive organs: Secondary | ICD-10-CM | POA: Insufficient documentation

## 2020-11-20 DIAGNOSIS — M25562 Pain in left knee: Secondary | ICD-10-CM | POA: Diagnosis not present

## 2020-11-20 DIAGNOSIS — Z885 Allergy status to narcotic agent status: Secondary | ICD-10-CM | POA: Insufficient documentation

## 2020-11-20 DIAGNOSIS — F32A Depression, unspecified: Secondary | ICD-10-CM | POA: Insufficient documentation

## 2020-11-20 DIAGNOSIS — Z803 Family history of malignant neoplasm of breast: Secondary | ICD-10-CM | POA: Diagnosis not present

## 2020-11-20 DIAGNOSIS — R911 Solitary pulmonary nodule: Secondary | ICD-10-CM | POA: Diagnosis not present

## 2020-11-20 DIAGNOSIS — F419 Anxiety disorder, unspecified: Secondary | ICD-10-CM | POA: Diagnosis not present

## 2020-11-20 DIAGNOSIS — J984 Other disorders of lung: Secondary | ICD-10-CM | POA: Diagnosis not present

## 2020-11-20 DIAGNOSIS — Z886 Allergy status to analgesic agent status: Secondary | ICD-10-CM | POA: Diagnosis not present

## 2020-11-20 DIAGNOSIS — E785 Hyperlipidemia, unspecified: Secondary | ICD-10-CM | POA: Diagnosis not present

## 2020-11-20 DIAGNOSIS — N6310 Unspecified lump in the right breast, unspecified quadrant: Secondary | ICD-10-CM | POA: Diagnosis not present

## 2020-11-20 DIAGNOSIS — Z8249 Family history of ischemic heart disease and other diseases of the circulatory system: Secondary | ICD-10-CM | POA: Insufficient documentation

## 2020-11-20 LAB — CMP (CANCER CENTER ONLY)
ALT: 7 U/L (ref 0–44)
AST: 12 U/L — ABNORMAL LOW (ref 15–41)
Albumin: 3.8 g/dL (ref 3.5–5.0)
Alkaline Phosphatase: 72 U/L (ref 38–126)
Anion gap: 9 (ref 5–15)
BUN: 17 mg/dL (ref 8–23)
CO2: 24 mmol/L (ref 22–32)
Calcium: 8.9 mg/dL (ref 8.9–10.3)
Chloride: 105 mmol/L (ref 98–111)
Creatinine: 0.74 mg/dL (ref 0.44–1.00)
GFR, Estimated: >60 mL/min (ref >60)
Glucose, Bld: 144 mg/dL — ABNORMAL HIGH (ref 70–99)
Potassium: 3.8 mmol/L (ref 3.5–5.1)
Sodium: 138 mmol/L (ref 135–145)
Total Bilirubin: 0.3 mg/dL (ref 0.3–1.2)
Total Protein: 6.8 g/dL (ref 6.5–8.1)

## 2020-11-20 LAB — CBC WITH DIFFERENTIAL (CANCER CENTER ONLY)
Abs Immature Granulocytes: 0.01 10*3/uL (ref 0.00–0.07)
Basophils Absolute: 0 10*3/uL (ref 0.0–0.1)
Basophils Relative: 1 %
Eosinophils Absolute: 0.1 10*3/uL (ref 0.0–0.5)
Eosinophils Relative: 2 %
HCT: 34 % — ABNORMAL LOW (ref 36.0–46.0)
Hemoglobin: 11.6 g/dL — ABNORMAL LOW (ref 12.0–15.0)
Immature Granulocytes: 0 %
Lymphocytes Relative: 20 %
Lymphs Abs: 1.2 10*3/uL (ref 0.7–4.0)
MCH: 32.2 pg (ref 26.0–34.0)
MCHC: 34.1 g/dL (ref 30.0–36.0)
MCV: 94.4 fL (ref 80.0–100.0)
Monocytes Absolute: 0.5 10*3/uL (ref 0.1–1.0)
Monocytes Relative: 9 %
Neutro Abs: 4 10*3/uL (ref 1.7–7.7)
Neutrophils Relative %: 68 %
Platelet Count: 250 10*3/uL (ref 150–400)
RBC: 3.6 MIL/uL — ABNORMAL LOW (ref 3.87–5.11)
RDW: 13.1 % (ref 11.5–15.5)
WBC Count: 5.9 10*3/uL (ref 4.0–10.5)
nRBC: 0 % (ref 0.0–0.2)

## 2020-11-20 MED ORDER — DENOSUMAB 60 MG/ML ~~LOC~~ SOSY
60.0000 mg | PREFILLED_SYRINGE | Freq: Once | SUBCUTANEOUS | Status: AC
  Administered 2020-11-20: 60 mg via SUBCUTANEOUS
  Filled 2020-11-20: qty 1

## 2020-11-20 NOTE — Therapy (Signed)
[facility] [place], [place], [address] Phone: [phone]   Fax:  [phone]  Physical Therapy Treatment [facility] Note  Patient Details  Name: Brittany [name] MRN: [id number] Date of Birth: 08-19-1945 Referring Provider (PT): Dr. Erlinda Hong   Encounter Date: 11/20/2020  Progress Note Reporting Period 10/25/2020 to 11/20/2020  See note below for Objective Data and Assessment of Progress/Goals.        PT End of Session - 11/20/20 0849     Visit Number 10    Number of Visits 18    Date for PT Re-Evaluation 12/06/20    Authorization Type [employer]    Progress Note Due on Visit 20    PT Start Time 0844    PT Stop Time 0927    PT Time Calculation (min) 43 min    Activity Tolerance Patient tolerated treatment well    Behavior During Therapy [facility] for tasks assessed/performed             Past Medical History:  Diagnosis Date   A-fib ([place])    Acid reflux    Acute cystitis with positive culture 03/06/2014   Anemia    Anxiety    Arthritis    Baker cyst, left 04/06/2016   Aspirated 04/06/2016   Breast cancer ([place]) 11/23/2007   right, ER/PR -, HER 2 -   Cognitive deficits 09/25/2014   2016 Very mild, could be related to meds    Degenerative arthritis of left knee 04/06/2016   Injection and aspiration 04/06/2016   Degenerative arthritis of right knee 09/27/2015   Depression    Esophageal stricture    Gastritis 10/2007   History of chemotherapy    neoadjuvant chemo, last dose 03/2008   History of radiation therapy 07/25/08 -09/11/08   right breast   Hx of colonic polyps 08/11/2016   Hyperlipidemia    Migraines    rare   Myalgia 07/18/2013   5/15 multiple site    OSA (obstructive sleep apnea)    severe OSA AHI 34, intolerant to PAP and now s/p hypoglossal nerve stimulator implant   Osteopenia    Rupture of quadriceps tendon 03/23/2017    Past Surgical History:  Procedure Laterality Date   ATRIAL FIBRILLATION  ABLATION N/A 10/07/2018   Procedure: ATRIAL FIBRILLATION ABLATION;  Surgeon: [name], MD;  Location: [place] [facility];  Service: Cardiovascular;  Laterality: N/A;   BREAST LUMPECTOMY  06/26/2008   right, CA   XRT, chemo   COLONOSCOPY     DRUG INDUCED ENDOSCOPY N/A 02/21/2020   Procedure: DRUG INDUCED ENDOSCOPY;  Surgeon: Melida Quitter, MD;  Location: [facility];  Service: ENT;  Laterality: N/A;   EYE SURGERY Bilateral    growth removed per right thigh     as teen - mole   IMPLANTATION OF HYPOGLOSSAL NERVE STIMULATOR N/A 05/15/2020   Procedure: IMPLANTATION OF HYPOGLOSSAL NERVE STIMULATOR;  Surgeon: Melida Quitter, MD;  Location: [facility];  Service: ENT;  Laterality: N/A;   JOINT REPLACEMENT  12/31/2015   RTK   QUADRICEPS TENDON REPAIR Right 09/14/2016   Procedure: OPEN REPAIR QUADRICEP TENDON;  Surgeon: [name], MD;  [facility]: [facility];  Service: Orthopedics;  Laterality: Right;   TOTAL KNEE ARTHROPLASTY Right 12/31/2015   Procedure: RIGHT TOTAL KNEE ARTHROPLASTY;  Surgeon: [name], MD;  [facility]: [facility];  Service: Orthopedics;  Laterality: Right;   TOTAL KNEE ARTHROPLASTY Left 10/11/2020   Procedure: TOTAL KNEE ARTHROPLASTY;  Surgeon: [name],  MD;  Location: [facility];  Service: Orthopedics;  Laterality: Left;   UPPER GASTROINTESTINAL ENDOSCOPY      There were no vitals filed for this visit.   Subjective Assessment - 11/20/20 0851     Subjective Pt. stated pain comes and goes.  nothing specific today.  Pt. rated overall improvement 70%.    Patient Stated Goals to be able to walk more normally    Currently in Pain? No/denies    Pain Score 0-No pain                [facility] PT Assessment - 11/20/20 0001       Assessment   Medical Diagnosis Left TKA    Referring Provider (PT) Dr. Erlinda Hong    Onset Date/Surgical Date 10/11/20    Hand Dominance Left      Observation/Other Assessments   Focus on Therapeutic Outcomes (FOTO)  update 55      AROM    Right/Left Knee Left    Left Knee Extension -2    Left Knee Flexion 122   in supine heel slide     Strength   Left Knee Flexion 5/5    Left Knee Extension 5/5      Ambulation/Gait   Ambulation/Gait Assistance 7: Independent                           [facility] Adult PT Treatment/Exercise - 11/20/20 0001       Neuro Re-ed    Neuro Re-ed Details  tandem ambulation on foam fwd/reverse 8 ft x 6 each way in // bars, single leg stance c anterior, anterior/lateral, anterior/medial cone touching x8 bilateral      Knee/Hip Exercises: Stretches   Gastroc Stretch 60 seconds;2 reps;Both   incline board     Knee/Hip Exercises: Machines for Strengthening   Cybex Knee Extension eccentric lowering 10 lbs 3 x 10 Lt    Cybex Leg Press Double leg 87 lbs 2 x 15, 2 x 15 43 lbs Lt                       PT Short Term Goals - 11/18/20 2947       PT SHORT TERM GOAL #1   Title Pt will be indep with HEP    Status Achieved      PT SHORT TERM GOAL #2   Title Pt will be able to stand x 6 min to perform exercises without sitting    Status Achieved      PT SHORT TERM GOAL #3   Title Pt will demo improved gait with heel strike, knee flexion, improved hip extension with LRAD x 5 min    Status Achieved               PT Long Term Goals - 11/20/20 6546       PT LONG TERM GOAL #1   Title Pt will increase Lt LE strength >/=1 MMT grade to assist with improved energy efficiency    Status Achieved      PT LONG TERM GOAL #2   Title Pt will improve Lt knee flexion to lacking </=-5 degrees to assist with more normalized gt for shopping    Baseline left knee: 5-122 passively, 6-120 actively    Status Achieved      PT LONG TERM GOAL #3   Title Pt will improve Lt knee flexion to >/=115 degrees to assist with transfers  Baseline left knee 120 degrees actively    Status Achieved      PT LONG TERM GOAL #4   Title Pt will improve Foto score to 67% function    Time 6     Period Weeks    Status On-going    Target Date 12/06/20                   Plan - 11/20/20 3570     Clinical Impression Statement Pt. has attended 10 visits overall during course of treatment, reporting mild symptoms at worst at this time.  See objective data for updated information.  Pt. has made great gains in mobility and strength to this point.  Pt. may continue to benfeit from skilled PT services (at reduced frequency) to continue to progress functional strength and movement coordination c transitioning towards HEP.    Personal Factors and Comorbidities Comorbidity 3+    Comorbidities osteoporosis, hx of Breast CA, hx of Bakers cyst, R TKR    Examination-Activity Limitations Squat;Stairs;Locomotion Level;Stand    Examination-Participation Restrictions Cleaning;Shop;Community Activity;Meal Prep;Driving    Stability/Clinical Decision Making Stable/Uncomplicated    Rehab Potential Good    PT Frequency Other (comment)   1x/week (changed)   PT Duration 6 weeks    PT Treatment/Interventions ADLs/Self Care Home Management;Cryotherapy;Electrical Stimulation;DME Instruction;Gait training;Stair training;Balance training;Therapeutic exercise;Therapeutic activities;Functional mobility training;Neuromuscular re-education;Patient/family education;Manual techniques;Scar mobilization;Passive range of motion;Dry needling;Taping    PT Next Visit Plan Transition to 1x/week c focus on HEP transitioning for upcoming discharge.  Continued balance improvements/strengthening.    PT Home Exercise Plan Access Code: [email or web address]  URL: [email or web address]  Date: 10/28/2020  Prepared by: Kearney Hard    Exercises  Long Sitting Quad Set with Towel Roll Under Heel - 3 x daily - 7 x weekly - 2 sets - 10 reps - 5 seconds hold  Supine Active Straight Leg Raise - 3 x daily - 7 x weekly - 2 sets - 10 reps  Heel Raises with Counter Support - 3 x daily - 7 x weekly - 2 sets - 10 reps  Sit to Stand - 3 x  daily - 7 x weekly - 2 sets - 10 reps  Seated Long Arc Quad - 3 x daily - 7 x weekly - 2 sets - 10 reps - 5 seconds hold  Prone Knee Extension Hang - 3 x daily - 7 x weekly - start with 2 minutes and progress to 10 hold    Consulted and Agree with Plan of Care Patient             Patient will benefit from skilled therapeutic intervention in order to improve the following deficits and impairments:  Abnormal gait, Decreased endurance, Decreased mobility, Difficulty walking, Impaired sensation, Decreased scar mobility, Decreased range of motion, Decreased activity tolerance, Decreased strength, Impaired flexibility, Pain  Visit Diagnosis: Muscle weakness (generalized)  Abnormality of gait  Acute pain of left knee     Problem List Patient Active Problem List   Diagnosis Date Noted   Status post total left knee replacement 10/11/2020   Primary osteoarthritis of left knee 10/02/2020   Atrial fibrillation ([place]) 05/13/2018   Bloating 04/11/2018   Abnormal EKG 08/23/2017   Malignant neoplasm of overlapping sites of right breast in female, estrogen receptor positive ([place]) 08/17/2016   Hx of colonic polyps 08/11/2016   Iron deficiency anemia 07/28/2016   Blood donor 07/28/2016   Pain due to total right knee replacement ([place]) 04/06/2016   S/P right  TKA 12/31/2015   Baker's cyst, unruptured 09/27/2015   Insomnia 07/31/2015   Onychomycosis 09/25/2014   Urinary frequency 05/21/2014   Mitral valve prolapse 05/08/2014   Mitral regurgitation 05/08/2014   Dyslipidemia 02/09/2014   History of radiation therapy    Hiatal hernia    History of chemotherapy    Osteoporosis 03/21/2012   Seasonal and perennial allergic rhinitis 12/17/2011   B12 deficiency 10/09/2009   TOBACCO USE, QUIT 01/31/2009   THYROID NODULE 12/25/2008   GERD 11/23/2007   VARICOSE VEIN 12/29/2006   Anxiety state 12/23/2006   Depression 12/23/2006   Migraine variant 12/23/2006   Disorder of bone and cartilage  12/23/2006    Scot Jun, PT, DPT, OCS, ATC 11/20/20  9:38 AM   [facility] [address] [place], [place], [address] Phone: [phone]   Fax:  [phone]  Name: JAELA YEPEZ MRN: [id number] Date of Birth: 1946/02/25

## 2020-11-20 NOTE — Progress Notes (Signed)
MD [name] confirmed pt can receive Prolia with calcium at 8.9

## 2020-11-20 NOTE — Progress Notes (Signed)
ID: Brittany [name]   DOB: 09/03/1945  MR#: [id number]  WUJ#:[id number]  Patient Care Team: Wynn Banker, MD as PCP - General (Family Medicine) Jake Bathe, MD as PCP - Cardiology (Cardiology) Regan Lemming, MD as PCP - Electrophysiology (Cardiology) Quintella Reichert, MD as PCP - Sleep Medicine (Cardiology) Sparkles Mcneely, Valentino Hue, MD as Consulting Physician (Hematology and Oncology) Quintella Reichert, MD as Consulting Physician (Sleep Medicine) Verner Chol, [facility] as Pharmacist (Pharmacist) OTHER MD:    CHIEF COMPLAINT:  Triple negative Breast Cancer; osteoporosis  CURRENT TREATMENT: Observation; denosumab/Prolia   INTERVAL HISTORY: Brittany [name] returns today for follow-up of her triple negative breast cancer. She continues under observation.  Her most recent mammography, at [facility] on 02/13/2020 showed breast density category C, no evidence of malignancy.  She is scheduled for repeat mammography 02/18/2021  She receives denosumab every 6 months with her most recent dose 04/11/2020.  She is due for a dose today. She tolerates this well.   Her most recent bone density screening on 05/05/2019 showing a T-score of -2.4, which is considered osteopenic. Prior in 04/2017 was -2.2.  Basically the Prolia is keeping her bone density between negative 2.2 and 2.4  Since her last visit, she underwent bilateral diagnostic mammography with tomography at [facility] on 02/13/2020 showing: breast density category C; no evidence of malignancy in either breast.   She also had a chest CT scan 05/27/2020 for follow-up of a lung nodule, which showed only stable biapical scarring.   REVIEW OF SYSTEMS: Brittany [name] just had left knee replacement under Dr. Roda Shutters.  She is very pleased with the results.  She tells me she has very minimal pain when she walks now although she has not yet started a significant walking program.  There are some family issues that we discussed.  Aside from that a detailed review of systems today was  stable   COVID 19 VACCINATION STATUS: Moderna x4, most recently 09/2020    HISTORY OF PRESENT ILLNESS: From original  intake note:  The patient noted a mass in her right breast and immediately brought it to Dr. Cherlyn Labella attention 11/22/2007.  I should note that the patient had a negative routine screening mammogram at [facility] on 07/19/2007.  In the 11/22/2007 study, there was at least a 1.5-cm ill-defined density seen primarily on the right MLO view.  There were a few associated microcalcifications.  By palpation, Dr. Yolanda Bonine felt this was approximately 1.9 cm.  By ultrasound, this was an irregular hypoechoic mass measuring up to 1.9 cm.  There were a few irregular vessels present by Doppler.  There were no abnormal lymph nodes noted.  Breast specific gamma imaging was performed the next day, 11/23/2007, and showed a normal left breast.  On the right, there was a 1.6-cm high-density focus of abnormal isotope activity.  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  HER2-neu was negative at 1+.   Her subsequent history is as detailed below.   PAST MEDICAL HISTORY: Past Medical History:  Diagnosis Date   A-fib (HCC)    Acid reflux    Acute cystitis with positive culture 03/06/2014   Anemia    Anxiety    Arthritis    Baker cyst, left 04/06/2016   Aspirated 04/06/2016   Breast cancer (HCC) 11/23/2007   right, ER/PR -,  HER 2 -   Cognitive deficits 09/25/2014   2016 Very mild, could be related to meds    Degenerative arthritis of left knee 04/06/2016   Injection and aspiration 04/06/2016   Degenerative arthritis of right knee 09/27/2015   Depression    Esophageal stricture    Gastritis 10/2007   History of chemotherapy    neoadjuvant chemo, last dose 03/2008   History of radiation therapy  07/25/08 -09/11/08   right breast   Hx of colonic polyps 08/11/2016   Hyperlipidemia    Migraines    rare   Myalgia 07/18/2013   5/15 multiple site    OSA (obstructive sleep apnea)    severe OSA AHI 34, intolerant to PAP and now s/p hypoglossal nerve stimulator implant   Osteopenia    Rupture of quadriceps tendon 03/23/2017  Significant for osteopenia, history of hiatal hernia with esophageal stricture, status post upper endoscopy under [facility] 10/10/2007 showing an erosive gastritis in a background of reactive gastropathy ([id number]).  She has a history of rare migraines, and when she was 17, she had a benign growth removed from the back of her right calf.     PAST SURGICAL HISTORY: Past Surgical History:  Procedure Laterality Date   ATRIAL FIBRILLATION ABLATION N/A 10/07/2018   Procedure: ATRIAL FIBRILLATION ABLATION;  Surgeon: Regan Lemming, MD;  [facility]: [facility];  Service: Cardiovascular;  Laterality: N/A;   BREAST LUMPECTOMY  06/26/2008   right, CA   XRT, chemo   COLONOSCOPY     DRUG INDUCED ENDOSCOPY N/A 02/21/2020   Procedure: DRUG INDUCED ENDOSCOPY;  Surgeon: Christia Reading, MD;  Location: [facility];  Service: ENT;  Laterality: N/A;   EYE SURGERY Bilateral    growth removed per right thigh     as teen - mole   IMPLANTATION OF HYPOGLOSSAL NERVE STIMULATOR N/A 05/15/2020   Procedure: IMPLANTATION OF HYPOGLOSSAL NERVE STIMULATOR;  Surgeon: Christia Reading, MD;  Location: [facility] OR;  Service: ENT;  Laterality: N/A;   JOINT REPLACEMENT  12/31/2015   RTK   QUADRICEPS TENDON REPAIR Right 09/14/2016   Procedure: OPEN REPAIR QUADRICEP TENDON;  Surgeon: Durene Romans, MD;  Location: [facility];  Service: Orthopedics;  Laterality: Right;   TOTAL KNEE ARTHROPLASTY Right 12/31/2015   Procedure: RIGHT TOTAL KNEE ARTHROPLASTY;  Surgeon: Durene Romans, MD;  Location: [facility];  Service: Orthopedics;  Laterality: Right;   TOTAL KNEE ARTHROPLASTY Left 10/11/2020    Procedure: TOTAL KNEE ARTHROPLASTY;  Surgeon: Tarry Kos, MD;  Location: [facility];  Service: Orthopedics;  Laterality: Left;   UPPER GASTROINTESTINAL ENDOSCOPY      FAMILY HISTORY Family History  Problem Relation Age of Onset   Heart disease Father    Cancer Father        lung-nonsmoker   Breast cancer Sister 14   Colon cancer Sister 69   Epilepsy Mother        at older age   Dementia Mother        ?uncertain type   Hip fracture Mother        x2   Other Brother        suicide   Other Maternal Grandfather        didn't go to doctor   Esophageal cancer Neg Hx    Stomach cancer Neg Hx   The patient's father died from lung cancer at the age of 78; he was not a smoker.  The patient's mother is alive at age  41; she has epilepsy.  The patient has a sister diagnosed with breast cancer when she was 9.  She is doing well.  The patient has a brother in good health.  There is no other history of breast or ovarian cancer in the family.   GYNECOLOGIC HISTORY: She is GX P3, first pregnancy to term age 32, last menstrual period when she was 75 years old.  She never took any hormones.      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  Their son, Greig Castilla, works for [employer], an Electronics engineer, and is working for his [employer]; he has 2 children and lives in [place], right outside of [place].  They have a daughter, Lanora Manis, who lives in [place] and has 3 children, and a son, Vonna Kotyk, who just married a young woman from [place] and is now living in [place].  The patient attends the [employer].    ADVANCED DIRECTIVES: In the absence of any documentation to the contrary, the patient's spouse is their HCPOA.    HEALTH MAINTENANCE:   Social History   Tobacco Use   Smoking status: Former    Packs/day: 0.25     Years: 5.00    Pack years: 1.25    Types: Cigarettes    Quit date: 03/03/1967    Years since quitting: 53.7   Smokeless tobacco: Never   Tobacco comments:    6 cig per day when she was smoking/quit years ago  Vaping Use   Vaping Use: Never used  Substance Use Topics   Alcohol use: Yes    Alcohol/week: 7.0 standard drinks    Types: 7 Cans of beer per week    Comment: 1 beer per day - 2.4 % alcholol - Bud Select 55   Drug use: No     Colonoscopy:  April 2007/Dr. Gessner  PAP: Not on file  Bone density: 05/25/2017 showed a T score of  -2.2   Lipid panel: January 2015/Dr. Plotnikov   Allergies  Allergen Reactions   Boniva [Ibandronic Acid] Nausea Only   Hydrocodone-Acetaminophen Other (See Comments)    "Does not relieve pain", patient reported.  Can take Tylenol.   Lipitor [Atorvastatin] Other (See Comments)    Muscle and joint pain(s)   Pt request removal of this intolerance   Tramadol Other (See Comments)    "Does not help pain."- patient reported.    Current Outpatient Medications  Medication Sig Dispense Refill   apixaban (ELIQUIS) 5 MG TABS tablet Take 1 tablet (5 mg total) by mouth 2 (two) times daily. 60 tablet 5   Biotin 5000 MCG TABS Take 5,000 mcg by mouth daily.     buPROPion (WELLBUTRIN XL) 150 MG 24 hr tablet TAKE 1 TABLET BY MOUTH EVERY DAY 90 tablet 1   Calcium-Magnesium-Zinc (CAL-MAG-ZINC PO) Take 1 tablet by mouth daily.     Cholecalciferol (VITAMIN D) 50 MCG (2000 UT) tablet Take 2,000 Units by mouth daily.     citalopram (CELEXA) 40 MG tablet Take 1 tablet (40 mg total) by mouth daily. 90 tablet 1   denosumab (PROLIA) 60 MG/ML SOSY injection Inject 60 mg into the skin every 6 (six) months.     diphenhydrAMINE (BENADRYL) 25 MG tablet Take 25 mg by mouth every 6 (six) hours as needed for allergies.     docusate sodium (  COLACE) 100 MG capsule Take 1 capsule (100 mg total) by mouth daily as needed. 30 capsule 2   famotidine (PEPCID) 40 MG tablet TAKE 1 TABLET BY  MOUTH EVERY DAY (Patient taking differently: Take 40 mg by mouth daily.) 90 tablet 0   fluticasone (FLONASE) 50 MCG/ACT nasal spray Place 2 sprays into both nostrils daily. 16 g 6   HYDROmorphone (DILAUDID) 2 MG tablet Take 1 tablet (2 mg total) by mouth every 4 (four) hours as needed for severe pain. 30 tablet 0   methocarbamol (ROBAXIN) 500 MG tablet Take 1 tablet (500 mg total) by mouth 2 (two) times daily as needed. 20 tablet 2   Omega-3 Fatty Acids (FISH OIL) 1200 MG CAPS Take 1,200 mg by mouth daily.     ondansetron (ZOFRAN) 4 MG tablet Take 1 tablet (4 mg total) by mouth every 8 (eight) hours as needed for nausea or vomiting. 40 tablet 0   oxyCODONE-acetaminophen (PERCOCET) 5-325 MG tablet Take 1-2 tablets by mouth every 6 (six) hours as needed. 40 tablet 0   Probiotic Product (PROBIOTIC DAILY PO) Take 1 tablet by mouth daily.     SUMAtriptan (IMITREX) 100 MG tablet TAKE 1 TABLET BY MOUTH ONCE FOR 1 DOSE. MAY REPEAT IN 2 HOURS IF HEADACHE PERSISTS / RECURS (Patient taking differently: Take 100 mg by mouth every 2 (two) hours as needed for migraine.) 9 tablet 5   VALERIAN ROOT PO Take 1,200 mg by mouth at bedtime as needed (sleep).     vitamin B-12 (CYANOCOBALAMIN) 1000 MCG tablet Take 1,000 mcg by mouth daily.     zolpidem (AMBIEN) 10 MG tablet TAKE 0.5-1 TABLETS (5-10 MG TOTAL) BY MOUTH AT BEDTIME AS NEEDED. FOR SLEEP (Patient taking differently: Take 5 mg by mouth at bedtime as needed for sleep.) 90 tablet 1   Current Facility-Administered Medications  Medication Dose Route Frequency Provider Last Rate Last Admin   0.9 %  sodium chloride infusion  500 mL Intravenous Once Iva Boop, MD        OBJECTIVE: white woman in no acute distress  Vitals:   11/20/20 1332  BP: (!) 118/53  Pulse: 99  Resp: 18  Temp: 97.6 F (36.4 C)  SpO2: 96%      Body mass index is 20.53 kg/m.    ECOG FS: 0 Filed Weights   11/20/20 1332  Weight: 127 lb 3.2 oz (57.7 kg)    Sclerae unicteric,  EOMs intact Wearing a mask No cervical or supraclavicular adenopathy Lungs no rales or rhonchi Heart regular rate and rhythm Abd soft, nontender, positive bowel sounds MSK no focal spinal tenderness, no upper extremity lymphedema Neuro: nonfocal, well oriented, appropriate affect Breasts: The right breast has undergone lumpectomy and radiation.  There is no evidence of local recurrence.  The left breast is benign.  Both axillae are benign.   LAB RESULTS: Lab Results  Component Value Date   WBC 5.9 11/20/2020   NEUTROABS 4.0 11/20/2020   HGB 11.6 (L) 11/20/2020   HCT 34.0 (L) 11/20/2020   MCV 94.4 11/20/2020   PLT 250 11/20/2020      Chemistry      Component Value Date/Time   NA 136 10/12/2020 0322   NA 136 02/16/2017 1315   K 4.1 10/12/2020 0322   K 4.1 02/16/2017 1315   CL 103 10/12/2020 0322   CL 98 03/21/2012 1053   CO2 28 10/12/2020 0322   CO2 26 02/16/2017 1315   BUN 15 10/12/2020 0322  BUN 10.4 02/16/2017 1315   CREATININE 0.80 10/12/2020 0322   CREATININE 0.73 04/11/2020 1200   CREATININE 0.7 02/16/2017 1315      Component Value Date/Time   CALCIUM 8.3 (L) 10/12/2020 0322   CALCIUM 9.2 02/16/2017 1315   ALKPHOS 55 09/25/2020 1004   ALKPHOS 56 02/16/2017 1315   AST 15 09/25/2020 1004   AST 16 04/11/2020 1200   AST 17 02/16/2017 1315   ALT 11 09/25/2020 1004   ALT 10 04/11/2020 1200   ALT 7 02/16/2017 1315   BILITOT 0.6 09/25/2020 1004   BILITOT 0.4 04/11/2020 1200   BILITOT 0.34 02/16/2017 1315      STUDIES: No results found.   ASSESSMENT: 75 y.o. Brittany [name] woman   (1) s/p right breast bopsy 11/2007 for a clinical  T1c N0, stage IA Invasive ductal carcinoma, grade 2-3, triple-negative with an MIB-1 of 50%.  (2) neoadjuvant chemotherapy consisted of docetaxel/ gemcitabine/ bevacizumab followed by doxorubicin/ cyclophosphamide/ bevacizumab as per the NSABP B-40 protocol.  Last bevacizumab dose was January of 2010.   (3) status-post right  lumpectomy and sentinel lymph node dissection in April of 2010 showing a complete pathologic response   (4) She completed adjuvant radiation July 2010.   (5)  Osteoporosis: on vit D/ calcium; received zolendronate  OCT 2013 and SEPT 2014, started prolia/ denosumab 11/30/2013, to be repeated every 6 months  (a) dexa scan 05/15/2013 showed a T score of -2.3 (improved)  (b) DEXA scan 05/13/2015 showed a T score of -2.2 (stable)  (c) DEXA scan 05/25/2017 showed a T score of  -2.2   (d) DEXA scan 05/05/2019 showed a T score of -2.4   PLAN:  Elita is now 12 years out from definitive surgery for her breast cancer with no evidence of disease recurrence.  This is very favorable.  Quite aside from the [place] pia issue, she tells me she derive significant benefit from seeing [place] on a once a year basis and they are reassuring of having that extra breast exam  She will receive Prolia today and we will continue to do that every 6 months at least for the next 2years.  She tolerates that without any difficulty and it is keeping her bone density stable.  She will see [place] again in 6 months just for treatment and then in 12 months for treatment and a visit  She is already scheduled for mammography December 2022.  Total encounter time 20 minutes.*   Kaelum Kissick, Valentino Hue, MD  11/20/20 1:38 PM Medical Oncology and Hematology [facility] [address] [place], [place] [address] Tel. [phone]    Fax. [phone]   I, Mickie Bail, am acting as scribe for Dr. Valentino Hue. Areeb Corron.  I, Ruthann Cancer MD, have reviewed the above documentation for accuracy and completeness, and I agree with the above.   *Total Encounter Time as defined by the Centers for Medicare and Medicaid Services includes, in addition to the face-to-face time of a patient visit (documented in the note above) non-face-to-face time: obtaining and reviewing outside history, ordering and reviewing medications, tests or  procedures, care coordination (communications with other health care professionals or caregivers) and documentation in the medical record.

## 2020-11-22 ENCOUNTER — Encounter: Payer: PPO | Admitting: Family Medicine

## 2020-11-22 ENCOUNTER — Ambulatory Visit (INDEPENDENT_AMBULATORY_CARE_PROVIDER_SITE_OTHER): Payer: PPO | Admitting: Orthopaedic Surgery

## 2020-11-22 ENCOUNTER — Encounter: Payer: PPO | Admitting: Rehabilitative and Restorative Service Providers"

## 2020-11-22 ENCOUNTER — Other Ambulatory Visit: Payer: Self-pay

## 2020-11-22 ENCOUNTER — Ambulatory Visit: Payer: Self-pay

## 2020-11-22 ENCOUNTER — Telehealth: Payer: Self-pay | Admitting: *Deleted

## 2020-11-22 ENCOUNTER — Encounter: Payer: Self-pay | Admitting: Orthopaedic Surgery

## 2020-11-22 DIAGNOSIS — Z96652 Presence of left artificial knee joint: Secondary | ICD-10-CM | POA: Diagnosis not present

## 2020-11-22 NOTE — Telephone Encounter (Signed)
Ortho bundle 30 day call completed. °

## 2020-11-22 NOTE — Progress Notes (Signed)
Post-Op Visit Note   Patient: Brittany [name]           Date of Birth: [birth year]           MRN: [id number] Visit Date: 11/22/2020 PCP: [name], MD   Assessment & Plan:  Chief Complaint:  Chief Complaint  Patient presents with   Left Knee - Pain   Visit Diagnoses:  1. Hx of total knee replacement, left   2. Status post total left knee replacement     Plan: Brittany [name] is now 6 weeks status post left total knee replacement.  She is overall doing well has no complaints.  She has been very pleased with this experience.  She has some mild pain is related to swelling.  She has been able to get 122 degrees flexion physical therapy.  She continues to do her home exercises regularly.  Left knee shows a fully healed surgical scar.  There is mild to moderate swelling.  Stable to varus valgus.  No signs of infection.  X-rays demonstrate a stable implant without any complications.  I am very happy that Brittany [name] is doing well at this juncture.  After reviewing her physical therapy notes I feel that she may discontinue formal PT and discharged to home exercises.  She is driving herself without any problems.  Dental prophylaxis reinforced.  She met with Judeen Hammans today as well.  Recheck in 6 weeks.  Follow-Up Instructions: Return in about 6 weeks (around 01/03/2021).   Orders:  Orders Placed This Encounter  Procedures   XR Knee 1-2 Views Left   No orders of the defined types were placed in this encounter.   Imaging: XR Knee 1-2 Views Left  Result Date: 11/22/2020 Stable total knee replacement in good alignment.    PMFS History: Patient Active Problem List   Diagnosis Date Noted   Status post total left knee replacement 10/11/2020   Primary osteoarthritis of left knee 10/02/2020   Atrial fibrillation ([place]) 05/13/2018   Bloating 04/11/2018   Abnormal EKG 08/23/2017   Malignant neoplasm of overlapping sites of right breast in female, estrogen receptor positive ([place]) 08/17/2016   Hx of  colonic polyps 08/11/2016   Iron deficiency anemia 07/28/2016   Blood donor 07/28/2016   Pain due to total right knee replacement ([place]) 04/06/2016   S/P right TKA 12/31/2015   Baker's cyst, unruptured 09/27/2015   Insomnia 07/31/2015   Onychomycosis 09/25/2014   Urinary frequency 05/21/2014   Mitral valve prolapse 05/08/2014   Mitral regurgitation 05/08/2014   Dyslipidemia 02/09/2014   History of radiation therapy    Hiatal hernia    History of chemotherapy    Osteoporosis 03/21/2012   Seasonal and perennial allergic rhinitis 12/17/2011   B12 deficiency 10/09/2009   TOBACCO USE, QUIT 01/31/2009   THYROID NODULE 12/25/2008   GERD 11/23/2007   VARICOSE VEIN 12/29/2006   Anxiety state 12/23/2006   Depression 12/23/2006   Migraine variant 12/23/2006   Disorder of bone and cartilage 12/23/2006   Past Medical History:  Diagnosis Date   A-fib ([place])    Acid reflux    Acute cystitis with positive culture 03/06/2014   Anemia    Anxiety    Arthritis    Baker cyst, left 04/06/2016   Aspirated 04/06/2016   Breast cancer ([place]) 11/23/2007   right, ER/PR -, HER 2 -   Cognitive deficits 09/25/2014   2016 Very mild, could be related to meds    Degenerative arthritis of left knee 04/06/2016  Injection and aspiration 04/06/2016   Degenerative arthritis of right knee 09/27/2015   Depression    Esophageal stricture    Gastritis 10/2007   History of chemotherapy    neoadjuvant chemo, last dose 03/2008   History of radiation therapy 07/25/08 -09/11/08   right breast   Hx of colonic polyps 08/11/2016   Hyperlipidemia    Migraines    rare   Myalgia 07/18/2013   5/15 multiple site    OSA (obstructive sleep apnea)    severe OSA AHI 34, intolerant to PAP and now s/p hypoglossal nerve stimulator implant   Osteopenia    Rupture of quadriceps tendon 03/23/2017    Family History  Problem Relation Age of Onset   Heart disease Father    Cancer Father        lung-nonsmoker   Breast  cancer Sister 30   Colon cancer Sister 95   Epilepsy Mother        at older age   Dementia Mother        ?uncertain type   Hip fracture Mother        x2   Other Brother        suicide   Other Maternal Grandfather        didn't go to doctor   Esophageal cancer Neg Hx    Stomach cancer Neg Hx     Past Surgical History:  Procedure Laterality Date   ATRIAL FIBRILLATION ABLATION N/A 10/07/2018   Procedure: ATRIAL FIBRILLATION ABLATION;  Surgeon: [name], MD;  Location: [place] [facility];  Service: Cardiovascular;  Laterality: N/A;   BREAST LUMPECTOMY  06/26/2008   right, CA   XRT, chemo   COLONOSCOPY     DRUG INDUCED ENDOSCOPY N/A 02/21/2020   Procedure: DRUG INDUCED ENDOSCOPY;  Surgeon: Melida Quitter, MD;  Location: [place];  Service: ENT;  Laterality: N/A;   EYE SURGERY Bilateral    growth removed per right thigh     as teen - mole   IMPLANTATION OF HYPOGLOSSAL NERVE STIMULATOR N/A 05/15/2020   Procedure: IMPLANTATION OF HYPOGLOSSAL NERVE STIMULATOR;  Surgeon: Melida Quitter, MD;  Location: [facility] OR;  Service: ENT;  Laterality: N/A;   JOINT REPLACEMENT  12/31/2015   RTK   QUADRICEPS TENDON REPAIR Right 09/14/2016   Procedure: OPEN REPAIR QUADRICEP TENDON;  Surgeon: [name], MD;  Location: [facility];  Service: Orthopedics;  Laterality: Right;   TOTAL KNEE ARTHROPLASTY Right 12/31/2015   Procedure: RIGHT TOTAL KNEE ARTHROPLASTY;  Surgeon: [name], MD;  Location: [facility];  Service: Orthopedics;  Laterality: Right;   TOTAL KNEE ARTHROPLASTY Left 10/11/2020   Procedure: TOTAL KNEE ARTHROPLASTY;  Surgeon: [name], MD;  Location: [place];  Service: Orthopedics;  Laterality: Left;   UPPER GASTROINTESTINAL ENDOSCOPY     Social History   Occupational History    Employer: RETIRED  Tobacco Use   Smoking status: Former    Packs/day: 0.25    Years: 5.00    Pack years: 1.25    Types: Cigarettes    Quit date: 03/03/1967    Years since quitting: 53.7    Smokeless tobacco: Never   Tobacco comments:    6 cig per day when she was smoking/quit years ago  Vaping Use   Vaping Use: Never used  Substance and Sexual Activity   Alcohol use: Yes    Alcohol/week: 7.0 standard drinks    Types: 7 Cans of beer per week    Comment:  1 beer per day - 2.4 % alcholol - Bud Select 55   Drug use: No   Sexual activity: Yes    Partners: Male    Birth control/protection: Post-menopausal

## 2020-11-25 ENCOUNTER — Encounter: Payer: PPO | Admitting: Physical Therapy

## 2020-11-27 ENCOUNTER — Encounter: Payer: PPO | Admitting: Physical Therapy

## 2020-11-28 ENCOUNTER — Encounter (HOSPITAL_BASED_OUTPATIENT_CLINIC_OR_DEPARTMENT_OTHER): Payer: PPO | Admitting: Cardiology

## 2020-11-29 ENCOUNTER — Encounter: Payer: PPO | Admitting: Rehabilitative and Restorative Service Providers"

## 2020-12-01 ENCOUNTER — Other Ambulatory Visit: Payer: Self-pay | Admitting: Family Medicine

## 2020-12-01 ENCOUNTER — Other Ambulatory Visit: Payer: Self-pay | Admitting: Podiatry

## 2020-12-02 ENCOUNTER — Ambulatory Visit: Payer: PPO | Admitting: Rehabilitative and Restorative Service Providers"

## 2020-12-02 ENCOUNTER — Other Ambulatory Visit: Payer: Self-pay

## 2020-12-02 ENCOUNTER — Encounter: Payer: Self-pay | Admitting: Rehabilitative and Restorative Service Providers"

## 2020-12-02 DIAGNOSIS — M6281 Muscle weakness (generalized): Secondary | ICD-10-CM | POA: Diagnosis not present

## 2020-12-02 DIAGNOSIS — M25562 Pain in left knee: Secondary | ICD-10-CM | POA: Diagnosis not present

## 2020-12-02 DIAGNOSIS — R269 Unspecified abnormalities of gait and mobility: Secondary | ICD-10-CM | POA: Diagnosis not present

## 2020-12-02 NOTE — Therapy (Signed)
[facility] [address] [place], [place], [address] Phone: [phone]   Fax:  [phone]  Physical Therapy Treatment/ Discharge  Patient Details  Name: Brittany [name] MRN: [id number] Date of Birth: Feb 12, 1946 Referring Provider (PT): Dr. Erlinda Hong   Encounter Date: 12/02/2020   PT End of Session - 12/02/20 1345     Visit Number 11    Number of Visits 18    Date for PT Re-Evaluation 12/06/20    Authorization Type [employer]    Progress Note Due on Visit 20    PT Start Time 1343    PT Stop Time 1406    PT Time Calculation (min) 23 min    Activity Tolerance Patient tolerated treatment well    Behavior During Therapy [facility] for tasks assessed/performed             Past Medical History:  Diagnosis Date   A-fib ([place])    Acid reflux    Acute cystitis with positive culture 03/06/2014   Anemia    Anxiety    Arthritis    Baker cyst, left 04/06/2016   Aspirated 04/06/2016   Breast cancer ([place]) 11/23/2007   right, ER/PR -, HER 2 -   Cognitive deficits 09/25/2014   2016 Very mild, could be related to meds    Degenerative arthritis of left knee 04/06/2016   Injection and aspiration 04/06/2016   Degenerative arthritis of right knee 09/27/2015   Depression    Esophageal stricture    Gastritis 10/2007   History of chemotherapy    neoadjuvant chemo, last dose 03/2008   History of radiation therapy 07/25/08 -09/11/08   right breast   Hx of colonic polyps 08/11/2016   Hyperlipidemia    Migraines    rare   Myalgia 07/18/2013   5/15 multiple site    OSA (obstructive sleep apnea)    severe OSA AHI 34, intolerant to PAP and now s/p hypoglossal nerve stimulator implant   Osteopenia    Rupture of quadriceps tendon 03/23/2017    Past Surgical History:  Procedure Laterality Date   ATRIAL FIBRILLATION ABLATION N/A 10/07/2018   Procedure: ATRIAL FIBRILLATION ABLATION;  Surgeon: [name], MD;  Location: [place] [facility];   Service: Cardiovascular;  Laterality: N/A;   BREAST LUMPECTOMY  06/26/2008   right, CA   XRT, chemo   COLONOSCOPY     DRUG INDUCED ENDOSCOPY N/A 02/21/2020   Procedure: DRUG INDUCED ENDOSCOPY;  Surgeon: Melida Quitter, MD;  Location: [place];  Service: ENT;  Laterality: N/A;   EYE SURGERY Bilateral    growth removed per right thigh     as teen - mole   IMPLANTATION OF HYPOGLOSSAL NERVE STIMULATOR N/A 05/15/2020   Procedure: IMPLANTATION OF HYPOGLOSSAL NERVE STIMULATOR;  Surgeon: Melida Quitter, MD;  Location: [facility] OR;  Service: ENT;  Laterality: N/A;   JOINT REPLACEMENT  12/31/2015   RTK   QUADRICEPS TENDON REPAIR Right 09/14/2016   Procedure: OPEN REPAIR QUADRICEP TENDON;  Surgeon: [name], MD;  Location: [facility];  Service: Orthopedics;  Laterality: Right;   TOTAL KNEE ARTHROPLASTY Right 12/31/2015   Procedure: RIGHT TOTAL KNEE ARTHROPLASTY;  Surgeon: [name], MD;  Location: [facility];  Service: Orthopedics;  Laterality: Right;   TOTAL KNEE ARTHROPLASTY Left 10/11/2020   Procedure: TOTAL KNEE ARTHROPLASTY;  Surgeon: [name], MD;  Location: [place];  Service: Orthopedics;  Laterality: Left;   UPPER GASTROINTESTINAL ENDOSCOPY      There were no vitals  filed for this visit.   Subjective Assessment - 12/02/20 1411     Subjective Pt. indicated she was ready for discharge today    Patient Stated Goals to be able to walk more normally    Currently in Pain? No/denies                [facility] PT Assessment - 12/02/20 0001       Assessment   Medical Diagnosis Left TKA    Referring Provider (PT) Dr. Erlinda Hong    Onset Date/Surgical Date 10/11/20    Hand Dominance Left      Observation/Other Assessments   Focus on Therapeutic Outcomes (FOTO)  update 65      Functional Tests   Functional tests Single leg stance      Single Leg Stance   Comments Lt SLS 6 seconds      AROM   Right/Left Knee Left    Left Knee Extension -3    Left Knee Flexion 130       Strength   Left Knee Flexion 5/5    Left Knee Extension 5/5      Ambulation/Gait   Ambulation/Gait Assistance 7: Independent    Gait Comments no restriction                           [facility] Adult PT Treatment/Exercise - 12/02/20 0001       Knee/Hip Exercises: Aerobic   Recumbent Bike Lvl 4 10 mins      Knee/Hip Exercises: Machines for Strengthening   Cybex Knee Extension eccentric lowering 10 lbs 3 x 10 Lt      Knee/Hip Exercises: Standing   Other Standing Knee Exercises flight of stairs up/down c reciprocal gait and Rt hand rail going down, Lt hand rail going down x 1 each way                       PT Short Term Goals - 11/18/20 0952       PT SHORT TERM GOAL #1   Title Pt will be indep with HEP    Status Achieved      PT SHORT TERM GOAL #2   Title Pt will be able to stand x 6 min to perform exercises without sitting    Status Achieved      PT SHORT TERM GOAL #3   Title Pt will demo improved gait with heel strike, knee flexion, improved hip extension with LRAD x 5 min    Status Achieved               PT Long Term Goals - 12/02/20 1408       PT LONG TERM GOAL #1   Title Pt will increase Lt LE strength >/=1 MMT grade to assist with improved energy efficiency    Status Achieved      PT LONG TERM GOAL #2   Title Pt will improve Lt knee flexion to lacking </=-5 degrees to assist with more normalized gt for shopping    Status Achieved      PT LONG TERM GOAL #3   Title Pt will improve Lt knee flexion to >/=115 degrees to assist with transfers    Baseline left knee 120 degrees actively    Status Achieved      PT LONG TERM GOAL #4   Title Pt will improve Foto score to 67% function    Time 6  Period Weeks    Status Partially Met                   Plan - 12/02/20 1408     Clinical Impression Statement Pt. has attended 11 visits overall and indicated mild symptoms at worst and good knowledge of HEP.  Updated  measurements in objective data.  Pt. was appropriate for d/c to HEP due to reaching goals.  Pt. was in agreement with plan.    Personal Factors and Comorbidities Comorbidity 3+    Comorbidities osteoporosis, hx of Breast CA, hx of Bakers cyst, R TKR    Examination-Activity Limitations Squat;Stairs;Locomotion Level;Stand    Examination-Participation Restrictions Cleaning;Shop;Community Activity;Meal Prep;Driving    Stability/Clinical Decision Making Stable/Uncomplicated    PT Treatment/Interventions ADLs/Self Care Home Management;Cryotherapy;Electrical Stimulation;DME Instruction;Gait training;Stair training;Balance training;Therapeutic exercise;Therapeutic activities;Functional mobility training;Neuromuscular re-education;Patient/family education;Manual techniques;Scar mobilization;Passive range of motion;Dry needling;Taping    PT Next Visit Plan d/c to hep    PT Home Exercise Plan Access Code: [email or web address]  URL: [email or web address]  Date: 10/28/2020  Prepared by: Kearney Hard    Exercises  Long Sitting Quad Set with Towel Roll Under Heel - 3 x daily - 7 x weekly - 2 sets - 10 reps - 5 seconds hold  Supine Active Straight Leg Raise - 3 x daily - 7 x weekly - 2 sets - 10 reps  Heel Raises with Counter Support - 3 x daily - 7 x weekly - 2 sets - 10 reps  Sit to Stand - 3 x daily - 7 x weekly - 2 sets - 10 reps  Seated Long Arc Quad - 3 x daily - 7 x weekly - 2 sets - 10 reps - 5 seconds hold  Prone Knee Extension Hang - 3 x daily - 7 x weekly - start with 2 minutes and progress to 10 hold    Consulted and Agree with Plan of Care Patient             Patient will benefit from skilled therapeutic intervention in order to improve the following deficits and impairments:  Abnormal gait, Decreased endurance, Decreased mobility, Difficulty walking, Impaired sensation, Decreased scar mobility, Decreased range of motion, Decreased activity tolerance, Decreased strength, Impaired flexibility,  Pain  Visit Diagnosis: Muscle weakness (generalized)  Abnormality of gait  Acute pain of left knee     Problem List Patient Active Problem List   Diagnosis Date Noted   Status post total left knee replacement 10/11/2020   Primary osteoarthritis of left knee 10/02/2020   Atrial fibrillation ([place]) 05/13/2018   Bloating 04/11/2018   Abnormal EKG 08/23/2017   Malignant neoplasm of overlapping sites of right breast in female, estrogen receptor positive ([place]) 08/17/2016   Hx of colonic polyps 08/11/2016   Iron deficiency anemia 07/28/2016   Blood donor 07/28/2016   Pain due to total right knee replacement ([place]) 04/06/2016   S/P right TKA 12/31/2015   Baker's cyst, unruptured 09/27/2015   Insomnia 07/31/2015   Onychomycosis 09/25/2014   Urinary frequency 05/21/2014   Mitral valve prolapse 05/08/2014   Mitral regurgitation 05/08/2014   Dyslipidemia 02/09/2014   History of radiation therapy    Hiatal hernia    History of chemotherapy    Osteoporosis 03/21/2012   Seasonal and perennial allergic rhinitis 12/17/2011   B12 deficiency 10/09/2009   TOBACCO USE, QUIT 01/31/2009   THYROID NODULE 12/25/2008   GERD 11/23/2007   VARICOSE VEIN 12/29/2006   Anxiety state 12/23/2006   Depression 12/23/2006  Migraine variant 12/23/2006   Disorder of bone and cartilage 12/23/2006    PHYSICAL THERAPY DISCHARGE SUMMARY  Visits from Start of Care: 11  Current functional level related to goals / functional outcomes: See note   Remaining deficits: See note   Education / Equipment: HEP   Patient agrees to discharge. Patient goals were met. Patient is being discharged due to being pleased with the current functional level.  Scot Jun, PT, DPT, OCS, ATC 12/02/20  2:12 PM     [facility] [address] [place], [place], [address] Phone: [phone]   Fax:  [phone]  Name: Brittany [name] MRN: [id number] Date of Birth:  01/26/1946

## 2020-12-03 ENCOUNTER — Encounter: Payer: Self-pay | Admitting: Family Medicine

## 2020-12-03 ENCOUNTER — Encounter: Payer: PPO | Admitting: Rehabilitative and Restorative Service Providers"

## 2020-12-09 ENCOUNTER — Encounter: Payer: PPO | Admitting: Physical Therapy

## 2020-12-18 ENCOUNTER — Encounter: Payer: PPO | Admitting: Family Medicine

## 2020-12-31 ENCOUNTER — Telehealth: Payer: PPO

## 2021-01-01 ENCOUNTER — Telehealth: Payer: Self-pay | Admitting: Pharmacist

## 2021-01-01 NOTE — Chronic Care Management (AMB) (Signed)
    Chronic Care Management Pharmacy Assistant   Name: Brittany [name]  MRN: [id number] DOB: 23-Apr-1945   Reason for Encounter: Reschedule patients initial visit. 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 Patient was rescheduled to 03/14/2021, she was good rescheduling to this date.  Care Gaps: AWV - 03/04/2021 Covid vaccine - overdue  Star Rating Drugs: None  [place] Pharmacist Assistant [phone]

## 2021-01-03 ENCOUNTER — Encounter: Payer: PPO | Admitting: Orthopaedic Surgery

## 2021-01-08 ENCOUNTER — Ambulatory Visit (INDEPENDENT_AMBULATORY_CARE_PROVIDER_SITE_OTHER): Payer: PPO | Admitting: Physician Assistant

## 2021-01-08 ENCOUNTER — Telehealth: Payer: Self-pay | Admitting: *Deleted

## 2021-01-08 ENCOUNTER — Other Ambulatory Visit: Payer: Self-pay

## 2021-01-08 ENCOUNTER — Encounter: Payer: Self-pay | Admitting: Orthopaedic Surgery

## 2021-01-08 DIAGNOSIS — Z96652 Presence of left artificial knee joint: Secondary | ICD-10-CM

## 2021-01-08 NOTE — Progress Notes (Signed)
Post-Op Visit Note   Patient: Brittany [name]           Date of Birth: 11-22-1945           MRN: [id number] Visit Date: 01/08/2021 PCP: [name], MD   Assessment & Plan:  Chief Complaint:  Chief Complaint  Patient presents with   Left Knee - Post-op Follow-up   Visit Diagnoses:  1. History of total knee replacement, left     Plan: Patient is a pleasant 75 year old female who comes in today 3 months status post left total knee replacement 10/11/2020.  She has been doing well.  She notes stiffness to the left knee which occurs throughout the day.  She takes an occasional Tylenol as needed.  Examination of the left knee reveals range of motion from 0 to 120 degrees.  Stable valgus varus stress.  She is neurovascular tact distally.  At this point, I recommended a course of oral and topical NSAIDs to try and alleviate the stiffness.  We have also discussed that it may be something that just takes time to improve as she is only 3 months out from surgery.  Dental prophylaxis reinforced.  She will follow-up with [place] in 3 months time for repeat evaluation and x-rays of the left knee.  Call with concerns or questions in the meantime.  Follow-Up Instructions: Return in about 3 months (around 04/10/2021).   Orders:  No orders of the defined types were placed in this encounter.  No orders of the defined types were placed in this encounter.   Imaging: No results found.  PMFS History: Patient Active Problem List   Diagnosis Date Noted   Status post total left knee replacement 10/11/2020   Primary osteoarthritis of left knee 10/02/2020   Atrial fibrillation ([place]) 05/13/2018   Bloating 04/11/2018   Abnormal EKG 08/23/2017   Malignant neoplasm of overlapping sites of right breast in female, estrogen receptor positive ([place]) 08/17/2016   Hx of colonic polyps 08/11/2016   Iron deficiency anemia 07/28/2016   Blood donor 07/28/2016   Pain due to total right knee replacement ([place])  04/06/2016   S/P right TKA 12/31/2015   Baker's cyst, unruptured 09/27/2015   Insomnia 07/31/2015   Onychomycosis 09/25/2014   Urinary frequency 05/21/2014   Mitral valve prolapse 05/08/2014   Mitral regurgitation 05/08/2014   Dyslipidemia 02/09/2014   History of radiation therapy    Hiatal hernia    History of chemotherapy    Osteoporosis 03/21/2012   Seasonal and perennial allergic rhinitis 12/17/2011   B12 deficiency 10/09/2009   TOBACCO USE, QUIT 01/31/2009   THYROID NODULE 12/25/2008   GERD 11/23/2007   VARICOSE VEIN 12/29/2006   Anxiety state 12/23/2006   Depression 12/23/2006   Migraine variant 12/23/2006   Disorder of bone and cartilage 12/23/2006   Past Medical History:  Diagnosis Date   A-fib ([place])    Acid reflux    Acute cystitis with positive culture 03/06/2014   Anemia    Anxiety    Arthritis    Baker cyst, left 04/06/2016   Aspirated 04/06/2016   Breast cancer ([place]) 11/23/2007   right, ER/PR -, HER 2 -   Cognitive deficits 09/25/2014   2016 Very mild, could be related to meds    Degenerative arthritis of left knee 04/06/2016   Injection and aspiration 04/06/2016   Degenerative arthritis of right knee 09/27/2015   Depression    Esophageal stricture    Gastritis 10/2007   History of chemotherapy  neoadjuvant chemo, last dose 03/2008   History of radiation therapy 07/25/08 -09/11/08   right breast   Hx of colonic polyps 08/11/2016   Hyperlipidemia    Migraines    rare   Myalgia 07/18/2013   5/15 multiple site    OSA (obstructive sleep apnea)    severe OSA AHI 34, intolerant to PAP and now s/p hypoglossal nerve stimulator implant   Osteopenia    Rupture of quadriceps tendon 03/23/2017    Family History  Problem Relation Age of Onset   Heart disease Father    Cancer Father        lung-nonsmoker   Breast cancer Sister 71   Colon cancer Sister 92   Epilepsy Mother        at older age   Dementia Mother        ?uncertain type   Hip fracture  Mother        x2   Other Brother        suicide   Other Maternal Grandfather        didn't go to doctor   Esophageal cancer Neg Hx    Stomach cancer Neg Hx     Past Surgical History:  Procedure Laterality Date   ATRIAL FIBRILLATION ABLATION N/A 10/07/2018   Procedure: ATRIAL FIBRILLATION ABLATION;  Surgeon: [name], MD;  Location: [facility];  Service: Cardiovascular;  Laterality: N/A;   BREAST LUMPECTOMY  06/26/2008   right, CA   XRT, chemo   COLONOSCOPY     DRUG INDUCED ENDOSCOPY N/A 02/21/2020   Procedure: DRUG INDUCED ENDOSCOPY;  Surgeon: Melida Quitter, MD;  Location: [facility];  Service: ENT;  Laterality: N/A;   EYE SURGERY Bilateral    growth removed per right thigh     as teen - mole   IMPLANTATION OF HYPOGLOSSAL NERVE STIMULATOR N/A 05/15/2020   Procedure: IMPLANTATION OF HYPOGLOSSAL NERVE STIMULATOR;  Surgeon: Melida Quitter, MD;  Location: [facility] OR;  Service: ENT;  Laterality: N/A;   JOINT REPLACEMENT  12/31/2015   RTK   QUADRICEPS TENDON REPAIR Right 09/14/2016   Procedure: OPEN REPAIR QUADRICEP TENDON;  Surgeon: [name], MD;  Location: [facility];  Service: Orthopedics;  Laterality: Right;   TOTAL KNEE ARTHROPLASTY Right 12/31/2015   Procedure: RIGHT TOTAL KNEE ARTHROPLASTY;  Surgeon: [name], MD;  Location: [facility];  Service: Orthopedics;  Laterality: Right;   TOTAL KNEE ARTHROPLASTY Left 10/11/2020   Procedure: TOTAL KNEE ARTHROPLASTY;  Surgeon: [name], MD;  Location: [place];  Service: Orthopedics;  Laterality: Left;   UPPER GASTROINTESTINAL ENDOSCOPY     Social History   Occupational History    Employer: RETIRED  Tobacco Use   Smoking status: Former    Packs/day: 0.25    Years: 5.00    Pack years: 1.25    Types: Cigarettes    Quit date: 03/03/1967    Years since quitting: 53.8   Smokeless tobacco: Never   Tobacco comments:    6 cig per day when she was smoking/quit years ago  Vaping Use   Vaping Use: Never used   Substance and Sexual Activity   Alcohol use: Yes    Alcohol/week: 7.0 standard drinks    Types: 7 Cans of beer per week    Comment: 1 beer per day - 2.4 % alcholol - [employer] 55   Drug use: No   Sexual activity: Yes    Partners: Male    Birth control/protection: Post-menopausal

## 2021-01-08 NOTE — Telephone Encounter (Signed)
Ortho bundle 90 day call completed; survey completed.

## 2021-01-09 ENCOUNTER — Other Ambulatory Visit: Payer: Self-pay

## 2021-01-09 ENCOUNTER — Ambulatory Visit (HOSPITAL_BASED_OUTPATIENT_CLINIC_OR_DEPARTMENT_OTHER): Payer: PPO | Attending: Cardiology | Admitting: Cardiology

## 2021-01-09 DIAGNOSIS — G4733 Obstructive sleep apnea (adult) (pediatric): Secondary | ICD-10-CM | POA: Insufficient documentation

## 2021-01-13 ENCOUNTER — Other Ambulatory Visit: Payer: Self-pay | Admitting: Internal Medicine

## 2021-01-26 NOTE — Procedures (Signed)
   Patient Name: Brittany [name], Brittany [name] Date: 01/09/2021 Gender: Female D.O.B: [birth year] Age (years): 68 Referring Provider: [name] MD, ABSM Height (inches): 67 Interpreting Physician: [name] MD, ABSM Weight (lbs): 123 RPSGT: Laren Everts BMI: 19 MRN: [id number] Neck Size: 12.00  CLINICAL INFORMATION The patient is referred for a Inspire  titration to treat sleep apnea.  SLEEP STUDY TECHNIQUE As per the AASM Manual for the Scoring of Sleep and Associated Events v2.3 (April 2016) with a hypopnea requiring 4% desaturations.  The channels recorded and monitored were frontal, central and occipital EEG, electrooculogram (EOG), submentalis EMG (chin), nasal and oral airflow, thoracic and abdominal Fermin motion, anterior tibialis EMG, snore microphone, electrocardiogram, and pulse oximetry. Continuous positive airway pressure (CPAP) was initiated at the beginning of the study and titrated to treat sleep-disordered breathing.  MEDICATIONS Medications self-administered by patient taken the night of the study : AMBIEN, ZOLPIDEM TARTRATE  TECHNICIAN COMMENTS Comments added by technician: None Comments added by scorer: N/A  RESPIRATORY PARAMETERS Optimal Therapeutic Amplitude (V):1.4  AHI at Optimal Pressure (/hr):7.5 Overall Minimal O2 (%):88.0  Supine % at Optimal Pressure (%):24 Minimal O2 at Optimal Pressure (%): 88.0   SLEEP ARCHITECTURE The study was initiated at 10:45:30 PM and ended at 4:56:07 AM.  Sleep onset time was 25.4 minutes and the sleep efficiency was 79.6%. The total sleep time was 295 minutes.  The patient spent 12.4% of the night in stage N1 sleep, 75.4% in stage N2 sleep, 0.0% in stage N3 and 12.2% in REM.Stage REM latency was 218.5 minutes  Wake after sleep onset was 50.3. Alpha intrusion was absent. Supine sleep was 10.00%.  CARDIAC DATA The 2 lead EKG demonstrated sinus rhythm. The mean heart rate was 81.0 beats per minute. Other EKG findings  include: PVCs.  LEG MOVEMENT DATA The total Periodic Limb Movements of Sleep (PLMS) were 0. The PLMS index was 0.0. A PLMS index of <15 is considered normal in adults.  IMPRESSIONS - The optimal therapeutic Amplitude was 1.4 V - Mild oxygen desaturations were observe.d during this titration (min O2 = 88.0%). - The patient snored with moderate snoring volume during this titration study. - 2-lead EKG demonstrated: PVCs - Clinically significant periodic limb movements were not noted during this study. Arousals associated with PLMs were rare.  DIAGNOSIS - Obstructive Sleep Apnea (G47.33)  RECOMMENDATIONS - The patient was set back at incoming device amplitude of 1.3V and will be brought into sleep lab for fine tuning of device settings.  - Avoid alcohol, sedatives and other CNS depressants that may worsen sleep apnea and disrupt normal sleep architecture. - Sleep hygiene should be reviewed to assess factors that may improve sleep quality. - Weight management and regular exercise should be initiated or continued.  [Electronically signed] 01/26/2021 08:37 PM  [name] MD, ABSM Diplomate, American Board of Sleep Medicine

## 2021-01-29 ENCOUNTER — Ambulatory Visit: Payer: PPO | Admitting: Cardiology

## 2021-02-10 ENCOUNTER — Encounter: Payer: Self-pay | Admitting: Family Medicine

## 2021-02-10 ENCOUNTER — Other Ambulatory Visit: Payer: Self-pay

## 2021-02-10 ENCOUNTER — Ambulatory Visit (INDEPENDENT_AMBULATORY_CARE_PROVIDER_SITE_OTHER): Payer: PPO

## 2021-02-10 ENCOUNTER — Ambulatory Visit (INDEPENDENT_AMBULATORY_CARE_PROVIDER_SITE_OTHER): Payer: PPO | Admitting: Family Medicine

## 2021-02-10 VITALS — BP 116/80 | HR 82 | Temp 97.9°F | Ht 66.5 in | Wt 126.6 lb

## 2021-02-10 DIAGNOSIS — M19041 Primary osteoarthritis, right hand: Secondary | ICD-10-CM | POA: Diagnosis not present

## 2021-02-10 DIAGNOSIS — Z0000 Encounter for general adult medical examination without abnormal findings: Secondary | ICD-10-CM

## 2021-02-10 DIAGNOSIS — I700 Atherosclerosis of aorta: Secondary | ICD-10-CM

## 2021-02-10 DIAGNOSIS — Z23 Encounter for immunization: Secondary | ICD-10-CM

## 2021-02-10 DIAGNOSIS — G4733 Obstructive sleep apnea (adult) (pediatric): Secondary | ICD-10-CM | POA: Diagnosis not present

## 2021-02-10 DIAGNOSIS — E785 Hyperlipidemia, unspecified: Secondary | ICD-10-CM

## 2021-02-10 DIAGNOSIS — F411 Generalized anxiety disorder: Secondary | ICD-10-CM | POA: Diagnosis not present

## 2021-02-10 MED ORDER — ZOLPIDEM TARTRATE 5 MG PO TABS: 2.5000 mg | ORAL_TABLET | Freq: Every evening | ORAL | 1 refills | Status: DC | PRN

## 2021-02-10 NOTE — Progress Notes (Signed)
Brittany [name] DOB: [birth year] Encounter date: 02/10/2021  This is a 75 y.o. female who presents for complete physical   History of present illness/Additional concerns:  Just got back from [place] at 1am this morning. Had great trip; visited son.   Thinks getting arthritis in right hand. Some pain that occurs in hand up to wrist. Not going further than wrist. Not sitting still. Does do knitting; but it is harder for her now. Uses voltaren which helps. Knitting harder due to pain. No swelling. No weakness in hands.   Sleep apnea: s/p hypoglossal nerve stimulator/Inspire implant 05/15/2020. Doing great with this. Getting decent nights sleep. Has follow up with Dr. Radford Pax next week as she just had a repeat sleep study.   Hx breast cancer: follows with Dr. Jana Hakim. Next mammogram scheduled 02/18/21.  HL: muscle and joint pain with lipitor. On pravastatin in past. Husband had hard time with statin. 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 Last dexa was 05/2019 Colonoscopy: completed 02/07/20; repeat suggested in 5 years. Mood: feels more even with citalopram 40mg , wellbutrin 150mg . Has just started with therapist. She has a hard time dealing with her daughter being alcoholic.  PAF s/p ablation: eliquis was stopped. She had discussed with cardiology. Not feeling skipping beats.  Seasonal allergies: flonase has helped a lot. Overall decent control. GERD: pepcid Migraine: imitrex 100mg  prn. Very rare to get one - once or twice a year.  Follows with Dr. Elvera Lennox for skin checks.    Past Medical History:  Diagnosis Date   A-fib ([place])    Acid reflux    Acute  cystitis with positive culture 03/06/2014   Anemia    Anxiety    Arthritis    Baker cyst, left 04/06/2016   Aspirated 04/06/2016   Breast cancer ([place]) 11/23/2007   right, ER/PR -, HER 2 -   Cognitive deficits 09/25/2014   2016 Very mild, could be related to meds    Degenerative arthritis of left knee 04/06/2016   Injection and aspiration 04/06/2016   Degenerative arthritis of right knee 09/27/2015   Depression    Esophageal stricture    Gastritis 10/2007   History of chemotherapy    neoadjuvant chemo, last dose 03/2008   History of radiation therapy 07/25/08 -09/11/08   right breast   Hx of colonic polyps 08/11/2016   Hyperlipidemia    Migraines    rare   Myalgia 07/18/2013   5/15 multiple site    OSA (obstructive sleep apnea)    severe OSA AHI 34, intolerant to PAP and now s/p hypoglossal nerve stimulator implant   Osteopenia    Rupture of quadriceps tendon 03/23/2017   Past Surgical History:  Procedure Laterality Date   ATRIAL FIBRILLATION ABLATION N/A 10/07/2018   Procedure: ATRIAL FIBRILLATION ABLATION;  Surgeon: [name], MD;  Location: [facility];  Service: Cardiovascular;  Laterality: N/A;   BREAST LUMPECTOMY  06/26/2008   right, CA   XRT, chemo   COLONOSCOPY  DRUG INDUCED ENDOSCOPY N/A 02/21/2020   Procedure: DRUG INDUCED ENDOSCOPY;  Surgeon: Melida Quitter, MD;  Location: [facility];  Service: ENT;  Laterality: N/A;   EYE SURGERY Bilateral    growth removed per right thigh     as teen - mole   IMPLANTATION OF HYPOGLOSSAL NERVE STIMULATOR N/A 05/15/2020   Procedure: IMPLANTATION OF HYPOGLOSSAL NERVE STIMULATOR;  Surgeon: Melida Quitter, MD;  Location: [facility];  Service: ENT;  Laterality: N/A;   JOINT REPLACEMENT  12/31/2015   RTK   QUADRICEPS TENDON REPAIR Right 09/14/2016   Procedure: OPEN REPAIR QUADRICEP TENDON;  Surgeon: [name], MD;  Location: [facility];  Service: Orthopedics;  Laterality: Right;   TOTAL KNEE ARTHROPLASTY  Right 12/31/2015   Procedure: RIGHT TOTAL KNEE ARTHROPLASTY;  Surgeon: [name], MD;  Location: [facility];  Service: Orthopedics;  Laterality: Right;   TOTAL KNEE ARTHROPLASTY Left 10/11/2020   Procedure: TOTAL KNEE ARTHROPLASTY;  Surgeon: [name], MD;  Location: [facility];  Service: Orthopedics;  Laterality: Left;   UPPER GASTROINTESTINAL ENDOSCOPY     Allergies  Allergen Reactions   Boniva [Ibandronic Acid] Nausea Only   Hydrocodone-Acetaminophen Other (See Comments)    "Does not relieve pain", patient reported.  Can take Tylenol.   Lipitor [Atorvastatin] Other (See Comments)    Muscle and joint pain(s)   Pt request removal of this intolerance   Tramadol Other (See Comments)    "Does not help pain."- patient reported.   Current Meds  Medication Sig   zolpidem (AMBIEN) 5 MG tablet Take 0.5-1 tablets (2.5-5 mg total) by mouth at bedtime as needed for sleep.   Current Facility-Administered Medications for the 02/10/21 encounter (Office Visit) with [name], MD  Medication   0.9 %  sodium chloride infusion   Social History   Tobacco Use   Smoking status: Former    Packs/day: 0.25    Years: 5.00    Pack years: 1.25    Types: Cigarettes    Quit date: 03/03/1967    Years since quitting: 53.9   Smokeless tobacco: Never   Tobacco comments:    6 cig per day when she was smoking/quit years ago  Substance Use Topics   Alcohol use: Yes    Alcohol/week: 7.0 standard drinks    Types: 7 Cans of beer per week    Comment: 1 beer per day - 2.4 % alcholol - [employer] 65   Family History  Problem Relation Age of Onset   Heart disease Father    Cancer Father        lung-nonsmoker   Breast cancer Sister 62   Colon cancer Sister 58   Epilepsy Mother        at older age   Dementia Mother        ?uncertain type   Hip fracture Mother        x2   Other Brother        suicide   Other Maternal Grandfather        didn't go to doctor   Esophageal cancer Neg Hx    Stomach  cancer Neg Hx      Review of Systems  Constitutional:  Negative for activity change, appetite change, chills, fatigue, fever and unexpected weight change.  HENT:  Negative for congestion, ear pain, hearing loss, sinus pressure, sinus pain, sore throat and trouble swallowing.   Eyes:  Negative for pain and visual disturbance.  Respiratory:  Negative for cough,  chest tightness, shortness of breath and wheezing.   Cardiovascular:  Negative for chest pain, palpitations and leg swelling.  Gastrointestinal:  Negative for abdominal pain, blood in stool, constipation, diarrhea, nausea and vomiting.  Genitourinary:  Negative for difficulty urinating and menstrual problem.  Musculoskeletal:  Positive for arthralgias (right hand). Negative for back pain.  Skin:  Negative for rash.  Neurological:  Negative for dizziness, weakness, numbness and headaches.  Hematological:  Negative for adenopathy. Does not bruise/bleed easily.  Psychiatric/Behavioral:  Negative for sleep disturbance and suicidal ideas. The patient is not nervous/anxious.    CBC:  Lab Results  Component Value Date   WBC 5.9 11/20/2020   WBC 8.8 10/12/2020   HGB 11.6 (L) 11/20/2020   HGB 11.4 (L) 02/16/2017   HCT 34.0 (L) 11/20/2020   HCT 34.7 (L) 02/16/2017   MCH 32.2 11/20/2020   MCHC 34.1 11/20/2020   RDW 13.1 11/20/2020   RDW 13.0 02/16/2017   PLT 250 11/20/2020   PLT 250 02/16/2017   CMP: Lab Results  Component Value Date   NA 138 11/20/2020   NA 136 02/16/2017   K 3.8 11/20/2020   K 4.1 02/16/2017   CL 105 11/20/2020   CL 98 03/21/2012   CO2 24 11/20/2020   CO2 26 02/16/2017   ANIONGAP 9 11/20/2020   GLUCOSE 144 (H) 11/20/2020   GLUCOSE 83 02/16/2017   GLUCOSE 84 03/21/2012   BUN 17 11/20/2020   BUN 10.4 02/16/2017   CREATININE 0.74 11/20/2020   CREATININE 0.7 02/16/2017   GFRAA >60 09/19/2019   CALCIUM 8.9 11/20/2020   CALCIUM 9.2 02/16/2017   PROT 6.8 11/20/2020   PROT 6.7 02/16/2017   BILITOT 0.3  11/20/2020   BILITOT 0.34 02/16/2017   ALKPHOS 72 11/20/2020   ALKPHOS 56 02/16/2017   ALT 7 11/20/2020   ALT 7 02/16/2017   AST 12 (L) 11/20/2020   AST 17 02/16/2017   LIPID: Lab Results  Component Value Date   CHOL 232 (H) 09/25/2020   TRIG 86.0 09/25/2020   HDL 64.50 09/25/2020   LDLCALC 150 (H) 09/25/2020    Objective:  BP 116/80 (BP Location: Left Arm, Patient Position: Sitting, Cuff Size: Normal)   Pulse 82   Temp 97.9 F (36.6 C) (Oral)   Ht 5' 6.5" (1.689 m)   Wt 126 lb 9.6 oz (57.4 kg)   LMP 03/02/1994 (Approximate)   SpO2 98%   BMI 20.13 kg/m   Weight: 126 lb 9.6 oz (57.4 kg)   BP Readings from Last 3 Encounters:  02/10/21 116/80  11/20/20 (!) 118/53  11/06/20 (!) 100/50   Wt Readings from Last 3 Encounters:  02/10/21 126 lb 9.6 oz (57.4 kg)  01/09/21 123 lb (55.8 kg)  11/20/20 127 lb 3.2 oz (57.7 kg)    Physical Exam Constitutional:      General: She is not in acute distress.    Appearance: She is well-developed.  HENT:     Head: Normocephalic and atraumatic.     Right Ear: External ear normal.     Left Ear: External ear normal.     Mouth/Throat:     Pharynx: No oropharyngeal exudate.  Eyes:     Conjunctiva/sclera: Conjunctivae normal.     Pupils: Pupils are equal, round, and reactive to light.  Neck:     Thyroid: No thyromegaly.  Cardiovascular:     Rate and Rhythm: Normal rate and regular rhythm.     Heart sounds: Normal heart sounds. No murmur heard.  No friction rub. No gallop.  Pulmonary:     Effort: Pulmonary effort is normal.     Breath sounds: Normal breath sounds.  Abdominal:     General: Bowel sounds are normal. There is no distension.     Palpations: Abdomen is soft. There is no mass.     Tenderness: There is no abdominal tenderness. There is no guarding.     Hernia: No hernia is present.  Musculoskeletal:        General: No tenderness or deformity. Normal range of motion.     Cervical back: Normal range of motion and neck  supple.     Comments: Bony enlargement 2nd MCP joint right hand. There is some lateral deviation bilat 2nd mcp joints. There is some pip joint enlargement  Lymphadenopathy:     Cervical: No cervical adenopathy.  Skin:    General: Skin is warm and dry.     Findings: No rash.  Neurological:     Mental Status: She is alert and oriented to person, place, and time.     Deep Tendon Reflexes: Reflexes normal.     Reflex Scores:      Tricep reflexes are 2+ on the right side and 2+ on the left side.      Bicep reflexes are 2+ on the right side and 2+ on the left side.      Brachioradialis reflexes are 2+ on the right side and 2+ on the left side.      Patellar reflexes are 2+ on the right side and 2+ on the left side. Psychiatric:        Speech: Speech normal.        Behavior: Behavior normal.        Thought Content: Thought content normal.    Assessment/Plan: There are no preventive care reminders to display for this patient.  Health Maintenance reviewed.  1. Preventative health care - Pneumococcal conjugate vaccine 20-valent (Prevnar 20)  2. Atherosclerosis of aorta ([place]) She prefers to avoid statins if possible. We reviewed ascvd risk scoring today, and with statin addition there is about 1-2 percent improvement in 10 year risk score. For difficulty that she had with lipitor and concerns with taking statin, we have elected to monitor and recheck next year.   3. Obstructive sleep apnea syndrome Doing great with osa implant.   4. Anxiety state Mood is stable, although under stress due to daughter with alcoholism. She has just started meeting with counselor.  Continue with citalopram 40 mg daily.  5. Dyslipidemia See above.  6. Arthritis of right hand She does get relief with joint pain using Voltaren gel.  We will get x-ray to make sure no bony erosion noted.  Discussed if worsening or if she feels that pain is not controlled she could follow-up with hand specialist. - [facility]  Complete Right; Future  7. Need for pneumococcal vaccination - Pneumococcal conjugate vaccine 20-valent (Prevnar 20)    Return in about 6 months (around 08/11/2021) for Chronic condition visit.  [name], MD

## 2021-02-14 ENCOUNTER — Ambulatory Visit: Payer: PPO | Admitting: Cardiology

## 2021-02-18 DIAGNOSIS — Z1231 Encounter for screening mammogram for malignant neoplasm of breast: Secondary | ICD-10-CM | POA: Diagnosis not present

## 2021-02-18 LAB — HM MAMMOGRAPHY

## 2021-02-27 ENCOUNTER — Encounter: Payer: Self-pay | Admitting: Family Medicine

## 2021-03-04 ENCOUNTER — Ambulatory Visit (INDEPENDENT_AMBULATORY_CARE_PROVIDER_SITE_OTHER): Payer: PPO

## 2021-03-04 VITALS — Ht 66.0 in | Wt 125.0 lb

## 2021-03-04 DIAGNOSIS — Z0000 Encounter for general adult medical examination without abnormal findings: Secondary | ICD-10-CM

## 2021-03-04 NOTE — Progress Notes (Signed)
I connected with Brittany [name] today by telephone and verified that I am speaking with the correct person using two identifiers. Location patient: home Location provider: work Persons participating in the virtual visit: Brittany [name], [name] LPN.   I discussed the limitations, risks, security and privacy concerns of performing an evaluation and management service by telephone and the availability of in person appointments. I also discussed with the patient that there may be a patient responsible charge related to this service. The patient expressed understanding and verbally consented to this telephonic visit.    Interactive audio and video telecommunications were attempted between this provider and patient, however failed, due to patient having technical difficulties OR patient did not have access to video capability.  We continued and completed visit with audio only.     Vital signs may be patient reported or missing.  Subjective:   Brittany [name] is a 76 y.o. female who presents for Medicare Annual (Subsequent) preventive examination.  Review of Systems     Cardiac Risk Factors include: advanced age (>77men, >42 women);dyslipidemia     Objective:    Today's Vitals   03/04/21 1326  Weight: 125 lb (56.7 kg)  Height: 5\' 6"  (1.676 m)   Body mass index is 20.18 kg/m.  Advanced Directives 03/04/2021 01/09/2021 10/25/2020 10/12/2020 09/30/2020 05/15/2020 02/21/2020  Does Patient Have a Medical Advance Directive? Yes Yes Yes Yes Yes Yes Yes  Type of Paramedic of [place];Living will Living will [place];Living will [place];Living will [place];Living will Living will [place];Living will  Does patient want to make changes to medical advance directive? - No - Patient declined - - - No - Patient declined No - Patient declined  Copy of [place] in Chart? No - copy  requested - No - copy requested No - copy requested No - copy requested - No - copy requested  Would patient like information on creating a medical advance directive? - - - - - No - Patient declined -    Current Medications (verified) Outpatient Encounter Medications as of 03/04/2021  Medication Sig   Biotin 5000 MCG TABS Take 5,000 mcg by mouth daily.   buPROPion (WELLBUTRIN XL) 150 MG 24 hr tablet TAKE 1 TABLET BY MOUTH EVERY DAY   Calcium-Magnesium-Zinc (CAL-MAG-ZINC PO) Take 1 tablet by mouth daily.   Cholecalciferol (VITAMIN D) 50 MCG (2000 UT) tablet Take 2,000 Units by mouth daily.   citalopram (CELEXA) 40 MG tablet Take 1 tablet (40 mg total) by mouth daily.   denosumab (PROLIA) 60 MG/ML SOSY injection Inject 60 mg into the skin every 6 (six) months.   diphenhydrAMINE (BENADRYL) 25 MG tablet Take 25 mg by mouth every 6 (six) hours as needed for allergies.   docusate sodium (COLACE) 100 MG capsule Take 1 capsule (100 mg total) by mouth daily as needed.   famotidine (PEPCID) 40 MG tablet TAKE 1 TABLET BY MOUTH EVERY DAY (Patient taking differently: Take 40 mg by mouth daily.)   fluticasone (FLONASE) 50 MCG/ACT nasal spray SPRAY 2 SPRAYS INTO EACH NOSTRIL EVERY DAY   Omega-3 Fatty Acids (FISH OIL) 1200 MG CAPS Take 1,200 mg by mouth daily.   Probiotic Product (PROBIOTIC DAILY PO) Take 1 tablet by mouth daily.   SUMAtriptan (IMITREX) 100 MG tablet TAKE 1 TABLET BY MOUTH ONCE FOR 1 DOSE. MAY REPEAT IN 2 HOURS IF HEADACHE PERSISTS / RECURS (Patient taking differently: Take 100  mg by mouth every 2 (two) hours as needed for migraine.)   VALERIAN ROOT PO Take 1,200 mg by mouth at bedtime as needed (sleep).   vitamin B-12 (CYANOCOBALAMIN) 1000 MCG tablet Take 1,000 mcg by mouth daily.   zolpidem (AMBIEN) 5 MG tablet Take 0.5-1 tablets (2.5-5 mg total) by mouth at bedtime as needed for sleep.   Facility-Administered Encounter Medications as of 03/04/2021  Medication   0.9 %  sodium chloride  infusion    Allergies (verified) Boniva [ibandronic acid], Hydrocodone-acetaminophen, Lipitor [atorvastatin], and Tramadol   History: Past Medical History:  Diagnosis Date   A-fib ([place])    Acid reflux    Acute cystitis with positive culture 03/06/2014   Anemia    Anxiety    Arthritis    Baker cyst, left 04/06/2016   Aspirated 04/06/2016   Breast cancer ([place]) 11/23/2007   right, ER/PR -, HER 2 -   Cognitive deficits 09/25/2014   2016 Very mild, could be related to meds    Degenerative arthritis of left knee 04/06/2016   Injection and aspiration 04/06/2016   Degenerative arthritis of right knee 09/27/2015   Depression    Esophageal stricture    Gastritis 10/2007   History of chemotherapy    neoadjuvant chemo, last dose 03/2008   History of radiation therapy 07/25/08 -09/11/08   right breast   Hx of colonic polyps 08/11/2016   Hyperlipidemia    Migraines    rare   Myalgia 07/18/2013   5/15 multiple site    OSA (obstructive sleep apnea)    severe OSA AHI 34, intolerant to PAP and now s/p hypoglossal nerve stimulator implant   Osteopenia    Rupture of quadriceps tendon 03/23/2017   Past Surgical History:  Procedure Laterality Date   ATRIAL FIBRILLATION ABLATION N/A 10/07/2018   Procedure: ATRIAL FIBRILLATION ABLATION;  Surgeon: [name], MD;  Location: [facility];  Service: Cardiovascular;  Laterality: N/A;   BREAST LUMPECTOMY  06/26/2008   right, CA   XRT, chemo   COLONOSCOPY     DRUG INDUCED ENDOSCOPY N/A 02/21/2020   Procedure: DRUG INDUCED ENDOSCOPY;  Surgeon: Melida Quitter, MD;  Location: [facility];  Service: ENT;  Laterality: N/A;   EYE SURGERY Bilateral    growth removed per right thigh     as teen - mole   IMPLANTATION OF HYPOGLOSSAL NERVE STIMULATOR N/A 05/15/2020   Procedure: IMPLANTATION OF HYPOGLOSSAL NERVE STIMULATOR;  Surgeon: Melida Quitter, MD;  [facility];  Service: ENT;  Laterality: N/A;   JOINT REPLACEMENT   12/31/2015   RTK   QUADRICEPS TENDON REPAIR Right 09/14/2016   Procedure: OPEN REPAIR QUADRICEP TENDON;  Surgeon: [name], MD;  Location: [facility];  Service: Orthopedics;  Laterality: Right;   TOTAL KNEE ARTHROPLASTY Right 12/31/2015   Procedure: RIGHT TOTAL KNEE ARTHROPLASTY;  Surgeon: [name], MD;  Location: [facility];  Service: Orthopedics;  Laterality: Right;   TOTAL KNEE ARTHROPLASTY Left 10/11/2020   Procedure: TOTAL KNEE ARTHROPLASTY;  Surgeon: [name], MD;  Location: [facility];  Service: Orthopedics;  Laterality: Left;   UPPER GASTROINTESTINAL ENDOSCOPY     Family History  Problem Relation Age of Onset   Heart disease Father    Cancer Father        lung-nonsmoker   Breast cancer Sister 69   Colon cancer Sister 78   Epilepsy Mother        at older age   Dementia Mother        ?  uncertain type   Hip fracture Mother        x2   Other Brother        suicide   Other Maternal Grandfather        didn't go to doctor   Esophageal cancer Neg Hx    Stomach cancer Neg Hx    Social History   Socioeconomic History   Marital status: Married    Spouse name: Not on file   Number of children: 3   Years of education: Not on file   Highest education level: Not on file  Occupational History    Employer: RETIRED  Tobacco Use   Smoking status: Former    Packs/day: 0.25    Years: 5.00    Pack years: 1.25    Types: Cigarettes    Quit date: 03/03/1967    Years since quitting: 54.0   Smokeless tobacco: Never   Tobacco comments:    6 cig per day when she was smoking/quit years ago  Vaping Use   Vaping Use: Never used  Substance and Sexual Activity   Alcohol use: Yes    Alcohol/week: 7.0 standard drinks    Types: 7 Cans of beer per week    Comment: 1 beer per day - 2.4 % alcholol - [employer] 55   Drug use: No   Sexual activity: Yes    Partners: Male    Birth control/protection: Post-menopausal  Other Topics Concern   Not on file  Social History Narrative   G3, P3, 1st  pregnancy age 89, menopause age 40, no HRT   Social Determinants of Radio broadcast assistant Strain: Low Risk    Difficulty of Paying Living Expenses: Not hard at all  Food Insecurity: No Food Insecurity   Worried About Charity fundraiser in the Last Year: Never true   Ran Out of Food in the Last Year: Never true  Transportation Needs: No Transportation Needs   Lack of Transportation (Medical): No   Lack of Transportation (Non-Medical): No  Physical Activity: Sufficiently Active   Days of Exercise per Week: 5 days   Minutes of Exercise per Session: 60 min  Stress: No Stress Concern Present   Feeling of Stress : Not at all  Social Connections: Not on file    Tobacco Counseling Counseling given: Not Answered Tobacco comments: 6 cig per day when she was smoking/quit years ago   Clinical Intake:  Pre-visit preparation completed: Yes  Pain : No/denies pain     Nutritional Status: BMI of 19-24  Normal Nutritional Risks: None Diabetes: No  How often do you need to have someone help you when you read instructions, pamphlets, or other written materials from your doctor or pharmacy?: 1 - Never What is the last grade level you completed in school?: 2 yrs college  Diabetic? no  Interpreter Needed?: No  Information entered by :: NAllen LPN   Activities of Daily Living In your present state of health, do you have any difficulty performing the following activities: 03/04/2021 10/12/2020  Hearing? N N  Vision? N N  Difficulty concentrating or making decisions? N Y  Walking or climbing stairs? N Y  Comment - -  Dressing or bathing? N Y  Doing errands, shopping? N N  Preparing Food and eating ? N -  Using the Toilet? N -  In the past six months, have you accidently leaked urine? N -  Do you have problems with loss of bowel control? N -  Managing your Medications? N -  Managing your Finances? N -  Housekeeping or managing your Housekeeping? N -  Some recent data might be  hidden    Patient Care Team: [name], MD as PCP - General (Family Medicine) [name], MD as PCP - Cardiology (Cardiology) [name], MD as PCP - Electrophysiology (Cardiology) [name], MD as PCP - Sleep Medicine (Cardiology) [name], [name], MD as Consulting Physician (Hematology and Oncology) [name], MD as Consulting Physician (Sleep Medicine) [name], [facility] as Pharmacist (Pharmacist) [name], MD as Attending Physician (Orthopedic Surgery)  Indicate any recent Medical Services you may have received from other than [facility] providers in the past year (date may be approximate).     Assessment:   This is a routine wellness examination for [place].  Hearing/Vision screen No results found.  Dietary issues and exercise activities discussed: Current Exercise Habits: Home exercise routine, Type of exercise: walking, Time (Minutes): 60, Frequency (Times/Week): 5, Weekly Exercise (Minutes/Week): 300   Goals Addressed             This Visit's Progress    Patient Stated       03/04/2021, no goals       Depression Screen PHQ 2/9 Scores 03/04/2021 02/10/2021 11/06/2020 02/20/2020 02/18/2018 02/12/2017 07/28/2016  PHQ - 2 Score 0 0 2 0 0 0 0  PHQ- 9 Score - 2 3 - 0 0 -    Fall Risk Fall Risk  03/04/2021 03/03/2021 02/10/2021 02/20/2020 01/25/2019  Falls in the past year? 1 1 1  0 1  Comment balance issues - - - Emmi Telephone Survey: data to providers prior to load  Number falls in past yr: 1 1 0 0 1  Comment - - - - Emmi Telephone Survey Actual Response = 1  Injury with Fall? 0 - 0 0 0  Risk for fall due to : Impaired balance/gait;Medication side effect - - Impaired vision;Impaired balance/gait;Impaired mobility -  Follow up Falls evaluation completed;Education provided - - Falls prevention discussed -    FALL RISK PREVENTION PERTAINING TO THE HOME:  Any stairs in or around the home? Yes  If so, are there any without handrails?  No  Home free of loose throw rugs in walkways, pet beds, electrical cords, etc? Yes  Adequate lighting in your home to reduce risk of falls? Yes   ASSISTIVE DEVICES UTILIZED TO PREVENT FALLS:  Life alert? No  Use of a cane, walker or w/c? No  Grab bars in the bathroom? Yes  Shower chair or bench in shower? Yes  Elevated toilet seat or a handicapped toilet? No   TIMED UP AND GO:  Was the test performed? No .      Cognitive Function: MMSE - Mini Mental State Exam 02/18/2018 02/12/2017  Orientation to time 5 5  Orientation to Place 5 5  Registration 3 3  Attention/ Calculation 5 5  Recall 3 3  Language- name 2 objects 2 2  Language- repeat 1 1  Language- follow 3 step command 3 3  Language- read & follow direction 1 1  Write a sentence 1 1  Copy design 1 1  Total score 30 30     6CIT Screen 03/04/2021 02/20/2020  What Year? 0 points 0 points  What month? 0 points 0 points  What time? 0 points -  Count back from 20 0 points 0 points  Months in reverse 0 points 0 points  Repeat  phrase 2 points 0 points  Total Score 2 -    Immunizations Immunization History  Administered Date(s) Administered   Fluad Quad(high Dose 65+) 11/06/2020   Influenza Split 12/01/2011   Influenza Whole 12/06/2007, 11/15/2008, 01/06/2010, 11/11/2010   Influenza, High Dose Seasonal PF 12/20/2012, 11/20/2014, 11/21/2017, 11/07/2019   Influenza,inj,Quad PF,6+ Mos 11/30/2013   Influenza,inj,quad, With Preservative 11/02/2016   Influenza-Unspecified 11/27/2015, 10/31/2016, 10/27/2018   Moderna Sars-Covid-2 Vaccination 03/30/2019, 04/27/2019, 01/08/2020, 09/30/2020   PNEUMOCOCCAL CONJUGATE-20 02/10/2021   Pneumococcal Conjugate-13 03/22/2013   Pneumococcal Polysaccharide-23 01/31/2009, 07/31/2015, 06/20/2019   Rabies, IM 03/04/2015, 03/07/2015, 03/11/2015, 03/18/2015   Td 10/09/2009   Tdap 03/04/2015   Zoster Recombinat (Shingrix) 02/13/2017, 07/03/2017   Zoster, Live 04/18/2009    TDAP  status: Up to date  Flu Vaccine status: Up to date  Pneumococcal vaccine status: Up to date  Covid-19 vaccine status: Completed vaccines  Qualifies for Shingles Vaccine? Yes   Zostavax completed Yes   Shingrix Completed?: Yes  Screening Tests Health Maintenance  Topic Date Due   COVID-19 Vaccine (5 - Booster for Moderna series) 11/25/2020   MAMMOGRAM  02/18/2022   COLONOSCOPY (Pts 45-68yrs Insurance coverage will need to be confirmed)  02/06/2025   TETANUS/TDAP  03/03/2025   Pneumonia Vaccine 19+ Years old  Completed   INFLUENZA VACCINE  Completed   DEXA SCAN  Completed   Hepatitis C Screening  Completed   Zoster Vaccines- Shingrix  Completed   HPV VACCINES  Aged Out    Health Maintenance  Health Maintenance Due  Topic Date Due   COVID-19 Vaccine (5 - Booster for Moderna series) 11/25/2020    Colorectal cancer screening: Type of screening: Colonoscopy. Completed 02/07/2020. Repeat every 10 years  Mammogram status: Completed 02/18/2021. Repeat every year  Bone Density status: Completed 05/05/2019.   Lung Cancer Screening: (Low Dose CT Chest recommended if Age 18-80 years, 30 pack-year currently smoking OR have quit w/in 15years.) does not qualify.   Lung Cancer Screening Referral: no  Additional Screening:  Hepatitis C Screening: does qualify; Completed 05/27/2015  Vision Screening: Recommended annual ophthalmology exams for early detection of glaucoma and other disorders of the eye. Is the patient up to date with their annual eye exam?  Yes  Who is the provider or what is the name of the office in which the patient attends annual eye exams? [facility] If pt is not established with a provider, would they like to be referred to a provider to establish care? No .   Dental Screening: Recommended annual dental exams for proper oral hygiene  Community Resource Referral / Chronic Care Management: CRR required this visit?  No   CCM required this visit?  No       Plan:     I have personally reviewed and noted the following in the patients chart:   Medical and social history Use of alcohol, tobacco or illicit drugs  Current medications and supplements including opioid prescriptions.  Functional ability and status Nutritional status Physical activity Advanced directives List of other physicians Hospitalizations, surgeries, and ER visits in previous 12 months Vitals Screenings to include cognitive, depression, and falls Referrals and appointments  In addition, I have reviewed and discussed with patient certain preventive protocols, quality metrics, and best practice recommendations. A written personalized care plan for preventive services as well as general preventive health recommendations were provided to patient.     Kellie Simmering, LPN   08/02/9474   Nurse Notes: none

## 2021-03-04 NOTE — Patient Instructions (Signed)
[name] , Thank you for taking time to come for your Medicare Wellness Visit. I appreciate your ongoing commitment to your health goals. Please review the following plan we discussed and let me know if I can assist you in the future.   Screening recommendations/referrals: Colonoscopy: completed 02/07/2020 Mammogram: completed 02/18/2021 Bone Density: completed 05/05/2019 Recommended yearly ophthalmology/optometry visit for glaucoma screening and checkup Recommended yearly dental visit for hygiene and checkup  Vaccinations: Influenza vaccine: completed 11/06/2020 Pneumococcal vaccine: completed 02/10/2021 Tdap vaccine: completed 03/04/2015, due 03/03/2025 Shingles vaccine: completed   Covid-19: 02/15/2021, 09/30/2020, 01/08/2020, 04/27/2019, 03/30/2019  Advanced directives: Please bring a copy of your POA (Power of Attorney) and/or Living Will to your next appointment.   Conditions/risks identified: none  Next appointment: Follow up in one year for your annual wellness visit    Preventive Care 65 Years and Older, Female Preventive care refers to lifestyle choices and visits with your health care provider that can promote health and wellness. What does preventive care include? A yearly physical exam. This is also called an annual well check. Dental exams once or twice a year. Routine eye exams. Ask your health care provider how often you should have your eyes checked. Personal lifestyle choices, including: Daily care of your teeth and gums. Regular physical activity. Eating a healthy diet. Avoiding tobacco and drug use. Limiting alcohol use. Practicing safe sex. Taking low-dose aspirin every day. Taking vitamin and mineral supplements as recommended by your health care provider. What happens during an annual well check? The services and screenings done by your health care provider during your annual well check will depend on your age, overall health, lifestyle risk factors, and family  history of disease. Counseling  Your health care provider may ask you questions about your: Alcohol use. Tobacco use. Drug use. Emotional well-being. Home and relationship well-being. Sexual activity. Eating habits. History of falls. Memory and ability to understand (cognition). Work and work Statistician. Reproductive health. Screening  You may have the following tests or measurements: Height, weight, and BMI. Blood pressure. Lipid and cholesterol levels. These may be checked every 5 years, or more frequently if you are over 71 years old. Skin check. Lung cancer screening. You may have this screening every year starting at age 40 if you have a 30-pack-year history of smoking and currently smoke or have quit within the past 15 years. Fecal occult blood test (FOBT) of the stool. You may have this test every year starting at age 56. Flexible sigmoidoscopy or colonoscopy. You may have a sigmoidoscopy every 5 years or a colonoscopy every 10 years starting at age 13. Hepatitis C blood test. Hepatitis B blood test. Sexually transmitted disease (STD) testing. Diabetes screening. This is done by checking your blood sugar (glucose) after you have not eaten for a while (fasting). You may have this done every 1-3 years. Bone density scan. This is done to screen for osteoporosis. You may have this done starting at age 31. Mammogram. This may be done every 1-2 years. Talk to your health care provider about how often you should have regular mammograms. Talk with your health care provider about your test results, treatment options, and if necessary, the need for more tests. Vaccines  Your health care provider may recommend certain vaccines, such as: Influenza vaccine. This is recommended every year. Tetanus, diphtheria, and acellular pertussis (Tdap, Td) vaccine. You may need a Td booster every 10 years. Zoster vaccine. You may need this after age 59. Pneumococcal 13-valent conjugate (PCV13)  vaccine. One dose is recommended after age 89. Pneumococcal polysaccharide (PPSV23) vaccine. One dose is recommended after age 16. Talk to your health care provider about which screenings and vaccines you need and how often you need them. This information is not intended to replace advice given to you by your health care provider. Make sure you discuss any questions you have with your health care provider. Document Released: 03/15/2015 Document Revised: 11/06/2015 Document Reviewed: 12/18/2014 Elsevier Interactive Patient Education  2017 [place] Prevention in the Home Falls can cause injuries. They can happen to people of all ages. There are many things you can do to make your home safe and to help prevent falls. What can I do on the outside of my home? Regularly fix the edges of walkways and driveways and fix any cracks. Remove anything that might make you trip as you walk through a door, such as a raised step or threshold. Trim any bushes or trees on the path to your home. Use bright outdoor lighting. Clear any walking paths of anything that might make someone trip, such as rocks or tools. Regularly check to see if handrails are loose or broken. Make sure that both sides of any steps have handrails. Any raised decks and porches should have guardrails on the edges. Have any leaves, snow, or ice cleared regularly. Use sand or salt on walking paths during winter. Clean up any spills in your garage right away. This includes oil or grease spills. What can I do in the bathroom? Use night lights. Install grab bars by the toilet and in the tub and shower. Do not use towel bars as grab bars. Use non-skid mats or decals in the tub or shower. If you need to sit down in the shower, use a plastic, non-slip stool. Keep the floor dry. Clean up any water that spills on the floor as soon as it happens. Remove soap buildup in the tub or shower regularly. Attach bath mats securely with  double-sided non-slip rug tape. Do not have throw rugs and other things on the floor that can make you trip. What can I do in the bedroom? Use night lights. Make sure that you have a light by your bed that is easy to reach. Do not use any sheets or blankets that are too big for your bed. They should not hang down onto the floor. Have a firm chair that has side arms. You can use this for support while you get dressed. Do not have throw rugs and other things on the floor that can make you trip. What can I do in the kitchen? Clean up any spills right away. Avoid walking on wet floors. Keep items that you use a lot in easy-to-reach places. If you need to reach something above you, use a strong step stool that has a grab bar. Keep electrical cords out of the way. Do not use floor polish or wax that makes floors slippery. If you must use wax, use non-skid floor wax. Do not have throw rugs and other things on the floor that can make you trip. What can I do with my stairs? Do not leave any items on the stairs. Make sure that there are handrails on both sides of the stairs and use them. Fix handrails that are broken or loose. Make sure that handrails are as long as the stairways. Check any carpeting to make sure that it is firmly attached to the stairs. Fix any carpet that is loose or  worn. Avoid having throw rugs at the top or bottom of the stairs. If you do have throw rugs, attach them to the floor with carpet tape. Make sure that you have a light switch at the top of the stairs and the bottom of the stairs. If you do not have them, ask someone to add them for you. What else can I do to help prevent falls? Wear shoes that: Do not have high heels. Have rubber bottoms. Are comfortable and fit you well. Are closed at the toe. Do not wear sandals. If you use a stepladder: Make sure that it is fully opened. Do not climb a closed stepladder. Make sure that both sides of the stepladder are locked  into place. Ask someone to hold it for you, if possible. Clearly mark and make sure that you can see: Any grab bars or handrails. First and last steps. Where the edge of each step is. Use tools that help you move around (mobility aids) if they are needed. These include: Canes. Walkers. Scooters. Crutches. Turn on the lights when you go into a dark area. Replace any light bulbs as soon as they burn out. Set up your furniture so you have a clear path. Avoid moving your furniture around. If any of your floors are uneven, fix them. If there are any pets around you, be aware of where they are. Review your medicines with your doctor. Some medicines can make you feel dizzy. This can increase your chance of falling. Ask your doctor what other things that you can do to help prevent falls. This information is not intended to replace advice given to you by your health care provider. Make sure you discuss any questions you have with your health care provider. Document Released: 12/13/2008 Document Revised: 07/25/2015 Document Reviewed: 03/23/2014 Elsevier Interactive Patient Education  2017 [employer].

## 2021-03-07 ENCOUNTER — Telehealth: Payer: Self-pay | Admitting: Pharmacist

## 2021-03-07 NOTE — Chronic Care Management (AMB) (Signed)
Chronic Care Management Pharmacy Assistant   Name: Brittany [name]  MRN: [id number] DOB: [birth year]  Brittany [name] is an 76 y.o. year old female who presents for her initial CCM visit with the clinical pharmacist.  Reason for Encounter: Chart prep for initial visit with Jeni Salles the clinical pharmacist on 03/14/2021 at 9:30 via telephone.   Conditions to be addressed/monitored: Atrial Fibrillation, Anxiety, GERD, Osteoporosis, and Osteoarthritis  Recent office visits:  03/04/2021 Brittany Durand LPN - Medicare annual wellness exam  02/10/2021 Brittany Camp MD - Patient was seen for preventative health care and additional issues. Decreased Zolpidem to 2.5 - 5 mg at bedtime. Discontinued Apixaban, Hydromorphone, Methocarbamol, Ondansetron and Oxycodone. Follow up in 6 months.  11/06/2020 Brittany Camp MD - Patient was seen for bradycardia and additional issues. No medication changes. Follow up in 1 year.  09/25/2020 Brittany Camp MD - Patient was seen for dyslipidemia and additional issues. Discontinued Tramadol. Follow up in 6 months.   Recent consult visits:  01/18/2021 Brittany Melena PA-C (orthopedic surgery) - Patient was seen for history of total knee replacement, left.  No medication changes. Follow up in 3 months.  11/22/2020 Brittany Shown MD (orthorpedic) - Patient was seen for history of total knee replacement, left and additional issues. No medication changes. Follow up in 6 weeks.   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 No medication changes. Follow up in 6 months.  10/25/2020 Brittany Melena PA-C (orthopedic surgery) - Patient was seen for history of total knee replacement, left. No medication changes. Follow up in 4 weeks.  Hospital visits:  None  Medications: Outpatient Encounter Medications as of 03/07/2021  Medication Sig   Biotin 5000 MCG TABS Take 5,000  mcg by mouth daily.   buPROPion (WELLBUTRIN XL) 150 MG 24 hr tablet TAKE 1 TABLET BY MOUTH EVERY DAY   Calcium-Magnesium-Zinc (CAL-MAG-ZINC PO) Take 1 tablet by mouth daily.   Cholecalciferol (VITAMIN D) 50 MCG (2000 UT) tablet Take 2,000 Units by mouth daily.   citalopram (CELEXA) 40 MG tablet Take 1 tablet (40 mg total) by mouth daily.   denosumab (PROLIA) 60 MG/ML SOSY injection Inject 60 mg into the skin every 6 (six) months.   diphenhydrAMINE (BENADRYL) 25 MG tablet Take 25 mg by mouth every 6 (six) hours as needed for allergies.   docusate sodium (COLACE) 100 MG capsule Take 1 capsule (100 mg total) by mouth daily as needed.   famotidine (PEPCID) 40 MG tablet TAKE 1 TABLET BY MOUTH EVERY DAY (Patient taking differently: Take 40 mg by mouth daily.)   fluticasone (FLONASE) 50 MCG/ACT nasal spray SPRAY 2 SPRAYS INTO EACH NOSTRIL EVERY DAY   Omega-3 Fatty Acids (FISH OIL) 1200 MG CAPS Take 1,200 mg by mouth daily.   Probiotic Product (PROBIOTIC DAILY PO) Take 1 tablet by mouth daily.   SUMAtriptan (IMITREX) 100 MG tablet TAKE 1 TABLET BY MOUTH ONCE FOR 1 DOSE. MAY REPEAT IN 2 HOURS IF HEADACHE PERSISTS / RECURS (Patient taking differently: Take 100 mg by mouth every 2 (two) hours as needed for migraine.)   VALERIAN ROOT PO Take 1,200 mg by mouth at bedtime as needed (sleep).   vitamin B-12 (CYANOCOBALAMIN) 1000 MCG tablet Take 1,000 mcg by mouth daily.   zolpidem (AMBIEN) 5 MG tablet Take 0.5-1 tablets (2.5-5 mg total) by mouth at bedtime as needed for sleep.   Facility-Administered Encounter Medications as of 03/07/2021  Medication   0.9 %  sodium chloride infusion  Fill History: CITALOPRAM HBR 40 MG TABLET 01/02/2021 90   DOCUSATE SODIUM 100 MG SOFTGEL 11/12/2020 30   FAMOTIDINE 40 MG TABLET 12/12/2020 90   FLUTICASONE PROP 50 MCG SPRAY 12/06/2020 90   ZOLPIDEM TARTRATE 5 MG TABLET 02/10/2021 90   BUPROPION HCL XL 150 MG TABLET 02/06/2021 90   Have you seen any other providers  since your last visit? Patient denies.  Any changes in your medications or health? Patient denies any changes.   Any side effects from any medications? Patient denies any side effects.   Do you have an symptoms or problems not managed by your medications? Patient denies any issues or problems.   Any concerns about your health right now? Patient denies any concerns.   Has your provider asked that you check blood pressure, blood sugar, or follow special diet at home? Patient denies checking blood pressures or blood sugar and not following any special diet.   Do you get any type of exercise on a regular basis? Patient states she walks daily.  Can you think of a goal you would like to reach for your health? Patient denies any goals at this time.   Do you have any problems getting your medications? Patient denies any issues getting medications.  Is there anything that you would like to discuss during the appointment? Patient denies.   Patient advised to have medications and supplements ready for appointment  Care Gaps: AWV - completed 03/04/2021 Last BP - 116/80 on 02/10/2021  Star Rating Drugs: None  [facility]  Clinical Pharmacist Assistant [phone]

## 2021-03-10 DIAGNOSIS — L814 Other melanin hyperpigmentation: Secondary | ICD-10-CM | POA: Diagnosis not present

## 2021-03-10 DIAGNOSIS — D485 Neoplasm of uncertain behavior of skin: Secondary | ICD-10-CM | POA: Diagnosis not present

## 2021-03-10 DIAGNOSIS — D225 Melanocytic nevi of trunk: Secondary | ICD-10-CM | POA: Diagnosis not present

## 2021-03-10 DIAGNOSIS — D2271 Melanocytic nevi of right lower limb, including hip: Secondary | ICD-10-CM | POA: Diagnosis not present

## 2021-03-10 DIAGNOSIS — D2261 Melanocytic nevi of right upper limb, including shoulder: Secondary | ICD-10-CM | POA: Diagnosis not present

## 2021-03-10 DIAGNOSIS — L821 Other seborrheic keratosis: Secondary | ICD-10-CM | POA: Diagnosis not present

## 2021-03-10 DIAGNOSIS — D2272 Melanocytic nevi of left lower limb, including hip: Secondary | ICD-10-CM | POA: Diagnosis not present

## 2021-03-11 ENCOUNTER — Telehealth: Payer: Self-pay | Admitting: Pharmacist

## 2021-03-11 NOTE — Chronic Care Management (AMB) (Signed)
° ° °  Chronic Care Management Pharmacy Assistant   Name: Brittany [name]  MRN: [id number] DOB: Nov 21, 1945  03/14/2021 APPOINTMENT REMINDER  Brittany [name] was reminded to have all medications, supplements and any blood glucose and blood pressure readings available for review with Jeni Salles, Pharm. D, at Brittany [name] telephone visit on 03/14/2021 at 9:30.   Questions: Have you had any recent office visit or specialist visit outside of [facility]? Patient denies any visits outside of [facility].  Are there any concerns you would like to discuss during your office visit? Patient denies any concerns at this time.  Are you having any problems obtaining your medications? (Whether it pharmacy issues or cost) Patient denies any problems getting medications.   If patient has any PAP medications ask if they are having any problems getting their PAP medication or refill? Patient denies any medications being filled by PAP  Care Gaps: AWV - completed 03/04/2021 Last BP - 116/80 on 02/10/2021  Star Rating Drug: None  Any gaps in medications fill history? no  [facility]  Clinical Pharmacist Assistant [phone]

## 2021-03-12 ENCOUNTER — Telehealth (INDEPENDENT_AMBULATORY_CARE_PROVIDER_SITE_OTHER): Payer: PPO | Admitting: Family Medicine

## 2021-03-12 ENCOUNTER — Encounter: Payer: Self-pay | Admitting: Family Medicine

## 2021-03-12 ENCOUNTER — Encounter: Payer: Self-pay | Admitting: Oncology

## 2021-03-12 DIAGNOSIS — U071 COVID-19: Secondary | ICD-10-CM | POA: Diagnosis not present

## 2021-03-12 MED ORDER — MOLNUPIRAVIR EUA 200MG CAPSULE
4.0000 | ORAL_CAPSULE | Freq: Two times a day (BID) | ORAL | 0 refills | Status: AC
Start: 2021-03-12 — End: 2021-03-17

## 2021-03-12 NOTE — Progress Notes (Signed)
Virtual Visit via Video Note  I connected with Brittany [name] on 03/12/21 at  4:30 PM EST by a video enabled telemedicine application 2/2 VHQIO-[id number] pandemic and verified that I am speaking with the correct person using two identifiers.  Location patient: home Location provider:work or home office Persons participating in the virtual visit: patient, provider  I discussed the limitations of evaluation and management by telemedicine and the availability of in person appointments. The patient expressed understanding and agreed to proceed.  Chief Complaint  Patient presents with   Covid Positive    Symptoms of runny nose, bad cough, swollen glands, and sore throat started yesterday. Tested positive today, till has sore throat and cough. Has only taken extra strength tylenol due to afib.     HPI: Pt is a 76 year old female past medical history significant for A. fib, migraines, MR, mitral valve prolapse, OSA, seasonal allergies, GERD, osteoporosis, OA status post b/l TKA, HLD, history of right breast cancer status post chemo and XRT followed by Dr. Ethlyn Gallery and seen for acute concern.  Tested positive for COVID this am 03/12/21. Symptoms started yesterday 03/11/2021 with rhinorrhea, sore throat, dry cough. Today developed stuffy nose and loss of taste. Denies n/v, fever, ear pain pressure, diarrhea.  Unsure of loss of smell. Appetite is not great. Pt took cough gtts for symptoms and Tylenol 2/2 history of A. fib.    Pt had 2 COVID vaccines and 3 boosters.  Unsure of sick contacts.  States husband is also sick with COVID.  ROS: See pertinent positives and negatives per HPI.  Past Medical History:  Diagnosis Date   A-fib ([place])    Acid reflux    Acute cystitis with positive culture 03/06/2014   Anemia    Anxiety    Arthritis    Baker cyst, left 04/06/2016   Aspirated 04/06/2016   Breast cancer ([place]) 11/23/2007   right, ER/PR -, HER 2 -   Cognitive deficits 09/25/2014   2016 Very mild,  could be related to meds    Degenerative arthritis of left knee 04/06/2016   Injection and aspiration 04/06/2016   Degenerative arthritis of right knee 09/27/2015   Depression    Esophageal stricture    Gastritis 10/2007   History of chemotherapy    neoadjuvant chemo, last dose 03/2008   History of radiation therapy 07/25/08 -09/11/08   right breast   Hx of colonic polyps 08/11/2016   Hyperlipidemia    Migraines    rare   Myalgia 07/18/2013   5/15 multiple site    OSA (obstructive sleep apnea)    severe OSA AHI 34, intolerant to PAP and now s/p hypoglossal nerve stimulator implant   Osteopenia    Rupture of quadriceps tendon 03/23/2017    Past Surgical History:  Procedure Laterality Date   ATRIAL FIBRILLATION ABLATION N/A 10/07/2018   Procedure: ATRIAL FIBRILLATION ABLATION;  Surgeon: [name], MD;  Location: [facility];  Service: Cardiovascular;  Laterality: N/A;   BREAST LUMPECTOMY  06/26/2008   right, CA   XRT, chemo   COLONOSCOPY     DRUG INDUCED ENDOSCOPY N/A 02/21/2020   Procedure: DRUG INDUCED ENDOSCOPY;  Surgeon: Melida Quitter, MD;  Location: [facility];  Service: ENT;  Laterality: N/A;   EYE SURGERY Bilateral    growth removed per right thigh     as teen - mole   IMPLANTATION OF HYPOGLOSSAL NERVE STIMULATOR N/A 05/15/2020   Procedure: IMPLANTATION OF HYPOGLOSSAL NERVE STIMULATOR;  Surgeon: Redmond Baseman,  Orpah Greek, MD;  Location: [facility];  Service: ENT;  Laterality: N/A;   JOINT REPLACEMENT  12/31/2015   RTK   QUADRICEPS TENDON REPAIR Right 09/14/2016   Procedure: OPEN REPAIR QUADRICEP TENDON;  Surgeon: [name], MD;  Location: [facility];  Service: Orthopedics;  Laterality: Right;   TOTAL KNEE ARTHROPLASTY Right 12/31/2015   Procedure: RIGHT TOTAL KNEE ARTHROPLASTY;  Surgeon: [name], MD;  Location: [facility];  Service: Orthopedics;  Laterality: Right;   TOTAL KNEE ARTHROPLASTY Left 10/11/2020   Procedure: TOTAL KNEE ARTHROPLASTY;  Surgeon:  [name], MD;  Location: [facility];  Service: Orthopedics;  Laterality: Left;   UPPER GASTROINTESTINAL ENDOSCOPY      Family History  Problem Relation Age of Onset   Heart disease Father    Cancer Father        lung-nonsmoker   Breast cancer Sister 73   Colon cancer Sister 99   Epilepsy Mother        at older age   Dementia Mother        ?uncertain type   Hip fracture Mother        x2   Other Brother        suicide   Other Maternal Grandfather        didn't go to doctor   Esophageal cancer Neg Hx    Stomach cancer Neg Hx      Current Outpatient Medications:    Biotin 5000 MCG TABS, Take 5,000 mcg by mouth daily., Disp: , Rfl:    buPROPion (WELLBUTRIN XL) 150 MG 24 hr tablet, TAKE 1 TABLET BY MOUTH EVERY DAY, Disp: 90 tablet, Rfl: 1   Calcium-Magnesium-Zinc (CAL-MAG-ZINC PO), Take 1 tablet by mouth daily., Disp: , Rfl:    Cholecalciferol (VITAMIN D) 50 MCG (2000 UT) tablet, Take 2,000 Units by mouth daily., Disp: , Rfl:    citalopram (CELEXA) 40 MG tablet, Take 1 tablet (40 mg total) by mouth daily., Disp: 90 tablet, Rfl: 1   denosumab (PROLIA) 60 MG/ML SOSY injection, Inject 60 mg into the skin every 6 (six) months., Disp: , Rfl:    diphenhydrAMINE (BENADRYL) 25 MG tablet, Take 25 mg by mouth every 6 (six) hours as needed for allergies., Disp: , Rfl:    docusate sodium (COLACE) 100 MG capsule, Take 1 capsule (100 mg total) by mouth daily as needed., Disp: 30 capsule, Rfl: 2   famotidine (PEPCID) 40 MG tablet, TAKE 1 TABLET BY MOUTH EVERY DAY (Patient taking differently: Take 40 mg by mouth daily.), Disp: 90 tablet, Rfl: 0   fluticasone (FLONASE) 50 MCG/ACT nasal spray, SPRAY 2 SPRAYS INTO EACH NOSTRIL EVERY DAY, Disp: 48 mL, Rfl: 2   Omega-3 Fatty Acids (FISH OIL) 1200 MG CAPS, Take 1,200 mg by mouth daily., Disp: , Rfl:    Probiotic Product (PROBIOTIC DAILY PO), Take 1 tablet by mouth daily., Disp: , Rfl:    SUMAtriptan (IMITREX) 100 MG tablet, TAKE 1 TABLET BY MOUTH ONCE FOR  1 DOSE. MAY REPEAT IN 2 HOURS IF HEADACHE PERSISTS / RECURS (Patient taking differently: Take 100 mg by mouth every 2 (two) hours as needed for migraine.), Disp: 9 tablet, Rfl: 5   VALERIAN ROOT PO, Take 1,200 mg by mouth at bedtime as needed (sleep)., Disp: , Rfl:    vitamin B-12 (CYANOCOBALAMIN) 1000 MCG tablet, Take 1,000 mcg by mouth daily., Disp: , Rfl:    zolpidem (AMBIEN) 5 MG tablet, Take 0.5-1 tablets (2.5-5 mg total) by mouth at bedtime as  needed for sleep., Disp: 90 tablet, Rfl: 1  Current Facility-Administered Medications:    0.9 %  sodium chloride infusion, 500 mL, Intravenous, Once, Gatha Mayer, MD  EXAM:  VITALS per patient if applicable: RR between 16-10 bpm  GENERAL: alert, oriented, appears well and in no acute distress  HEENT: atraumatic, conjunctiva clear, no obvious abnormalities on inspection of external nose and ears  NECK: normal movements of the head and neck  LUNGS: on inspection no signs of respiratory distress, breathing rate appears normal, no obvious gross SOB, gasping or wheezing  CV: no obvious cyanosis  MS: moves all visible extremities without noticeable abnormality  PSYCH/NEURO: pleasant and cooperative, no obvious depression or anxiety, speech and thought processing grossly intact  ASSESSMENT AND PLAN:  Discussed the following assessment and plan:  COVID-19 virus infection  -Positive COVID test today 03/12/2021 with symptoms starting 03/11/2021 -Appears to be a mild case but given history of A. fib discussed r/b/a of antiviral medication.  Patient wishes to start antiviral. -Continue rest, OTC cough/cold medications without decongestant, saline nasal rinse or Flonase nasal spray, Gargling with warm salt water or Chloraseptic spray, hydration, honey and warm fluids -Given strict precautions - Plan: molnupiravir EUA (LAGEVRIO) 200 mg CAPS capsule  Follow-up as needed    I discussed the assessment and treatment plan with the patient. The  patient was provided an opportunity to ask questions and all were answered. The patient agreed with the plan and demonstrated an understanding of the instructions.   The patient was advised to call back or seek an in-person evaluation if the symptoms worsen or if the condition fails to improve as anticipated.   Billie Ruddy, MD

## 2021-03-13 ENCOUNTER — Telehealth: Payer: Self-pay | Admitting: Pharmacist

## 2021-03-13 NOTE — Chronic Care Management (AMB) (Signed)
° ° °  Chronic Care Management Pharmacy Assistant   Name: Brittany [name]  MRN: [id number] DOB: [birth year]  Reason for Encounter: Patient tested positive for Covid. Patient has an appointment tomorrow for her initial visit with Jeni Salles, clinical pharmacist via a telephone visit.  Contacted patient to see if she felt well enough for the visit or if she would like to reschedule. Spoke with patient, she states she feels well enough for the appointment and is able to talk fine, she will keep her visit for tomorrow.     [facility] Pharmacist Assistant [phone]

## 2021-03-14 ENCOUNTER — Other Ambulatory Visit: Payer: Self-pay | Admitting: Family Medicine

## 2021-03-14 ENCOUNTER — Ambulatory Visit (INDEPENDENT_AMBULATORY_CARE_PROVIDER_SITE_OTHER): Payer: PPO | Admitting: Pharmacist

## 2021-03-14 DIAGNOSIS — F411 Generalized anxiety disorder: Secondary | ICD-10-CM

## 2021-03-14 DIAGNOSIS — I700 Atherosclerosis of aorta: Secondary | ICD-10-CM

## 2021-03-14 DIAGNOSIS — I480 Paroxysmal atrial fibrillation: Secondary | ICD-10-CM

## 2021-03-14 NOTE — Patient Instructions (Addendum)
Hi Brittany [name],  It was great to get to meet you over the telephone! Below is a summary of some of the topics we discussed.   Don't forget to: Increase your calcium to 2 tablets daily Try to find some HIIT exercises to see if this helps with your anxiety  Please reach out to me if you have any questions or need anything before I reach back out!  Best, Maddie  Jeni Salles, PharmD, [facility]   Visit Information   Goals Addressed   None    Patient Care Plan: CCM Pharmacy Care Plan     Problem Identified: Problem: Hyperlipidemia, Atrial Fibrillation, GERD, Depression, Anxiety, and Osteoporosis      Long-Range Goal: Patient-Specific Goal   Start Date: 03/14/2021  Expected End Date: 03/14/2022  This Visit's Progress: On track  Priority: High  Note:   Current Barriers:  Unable to independently monitor therapeutic efficacy Unable to maintain control of cholesterol  Pharmacist Clinical Goal(s):  Patient will achieve adherence to monitoring guidelines and medication adherence to achieve therapeutic efficacy maintain control of cholesterol as evidenced by next lipid panel  through collaboration with PharmD and provider.   Interventions: 1:1 collaboration with [name], MD regarding development and update of comprehensive plan of care as evidenced by provider attestation and co-signature Inter-disciplinary care team collaboration (see longitudinal plan of care) Comprehensive medication review performed; medication list updated in electronic medical record  Hyperlipidemia: (LDL goal < 100) -Uncontrolled -Current treatment: No medications -Medications previously tried: atorvastatin (myalgias)  -Current dietary patterns: limits fried foods -Current exercise habits: walking daily -Educated on Cholesterol goals;  Benefits of statin for ASCVD risk reduction; Importance of limiting foods high in  cholesterol; -Counseled on diet and exercise extensively Recommended CAC scoring to determine risk for needing statin therapy.  Atrial Fibrillation (Goal: prevent stroke and major bleeding) -Controlled -CHADSVASC: 2 -Current treatment: Rate control: none Anticoagulation: none -Medications previously tried: Eliquis & metoprolol (low risk; stopped by patient) -Home BP and HR readings: uses apple watch to monitor her HR  -Counseled on Afib action plan reviewed -Recommended monitoring heart rate and considering metoprolol if HR is > 100.  Depression/Anxiety (Goal: minimize symptoms) -Not ideally controlled -Current treatment: Citalopram 40 mg 1 tablet daily - appropriate, query effective Bupropion XL 150 mg 1 tablet daily - appropriate, query effective -Medications previously tried/failed: n/a -PHQ9: 0 -GAD7: n/a -Educated on Benefits of medication for symptom control Benefits of cognitive-behavioral therapy with or without medication -Counseled on diet and exercise extensively Recommended HIIT exercises for anxiety. Collaborated with PCP to consider Wellbutrin taper or escalation.  Insomnia (Goal: improve quality and quantity of sleep) -Controlled -Current treatment  Zolpidem 5 mg 1/2 to 1 tablet at bedtime as needed - appropriate, effective, query safe -Medications previously tried: Valerian root, melatonin (didn't help)  -Counseled on risks vs benefits with Ambien and consider going to 1/2 tablet every other day alternating with 1 tablet to see how patient tolerates.  Osteoporosis (Goal prevent fractures) -Controlled -Last DEXA Scan: 05/05/19   T-Score femoral neck: -2.3  T-Score total hip: -2.3  T-Score lumbar spine: -1.2  T-Score forearm radius: n/a  10-year probability of major osteoporotic fracture: n/a  10-year probability of hip fracture: n/a -Patient is a candidate for pharmacologic treatment due to T-Score < -2.5 in femoral neck and T-Score < -2.5 in total hip   -Current treatment  Prolia inject 60 mg every 6 months (last 11/20/20) - appropriate, effective, safe, accessible  Vitamin D 5000 units daily Calcium-mag-zinc 333 mg-133-5 mg 1 tablet daily  -Medications previously tried: Boniva (nausea)  -Recommend [phone] units of vitamin D daily. Recommend 1200 mg of calcium daily from dietary and supplemental sources. Recommend weight-bearing and muscle strengthening exercises for building and maintaining bone density. -Counseled on diet and exercise extensively Recommended to continue current medication Recommended increasing to 2 calcium tablets daily based on current intake.  GERD (Goal: minimize symptoms) -Controlled -Current treatment  Famotidine 40 mg 1 tablet daily  - appropriate, effective, safe, accessible -Medications previously tried: Zantac (removed from market) -Recommended to continue current medication Patient is on due to difficulty swallowing.  Allergic rhinitis (Goal: minimize symptoms) -Controlled -Current treatment  Fluticasone 50 mcg/act 2 sprays in each nostril once daily  - appropriate, effective, safe, accessible Diphenhydramine 25 mg as needed  - appropriate, effective, query safe -Medications previously tried: Claritin, Zyrtec (ineffective)  -Counseled on sedation and fall risk with Benadryl and recommended using sparingly.    Health Maintenance -Vaccine gaps: none -Current therapy:  Sumatriptan 100 mg 1 tablet as needed Biotin 5000 mcg 1 tablet daily Probiotic daily Vitamin B12 2500 mcg 1 tablet daily -Educated on Cost vs benefit of each product must be carefully weighed by individual consumer -Patient is satisfied with current therapy and denies issues -Recommended to continue current medication  Patient Goals/Self-Care Activities Patient will:  - take medications as prescribed as evidenced by patient report and record review target a minimum of 150 minutes of moderate intensity exercise weekly  Follow Up  Plan: The care management team will reach out to the patient again over the next 30 days.       Brittany [name] was given information about Chronic Care Management services today including:  CCM service includes personalized support from designated clinical staff supervised by her physician, including individualized plan of care and coordination with other care providers 24/7 contact phone numbers for assistance for urgent and routine care needs. Standard insurance, coinsurance, copays and deductibles apply for chronic care management only during months in which we provide at least 20 minutes of these services. Most insurances cover these services at 100%, however patients may be responsible for any copay, coinsurance and/or deductible if applicable. This service may help you avoid the need for more expensive face-to-face services. Only one practitioner may furnish and bill the service in a calendar month. The patient may stop CCM services at any time (effective at the end of the month) by phone call to the office staff.  Patient agreed to services and verbal consent obtained.   Patient verbalizes understanding of instructions and care plan provided today and agrees to view in [place]. Active MyChart status confirmed with patient.   The pharmacy team will reach out to the patient again over the next 30 days.   [name], [facility]

## 2021-03-14 NOTE — Progress Notes (Signed)
Chronic Care Management Pharmacy Note  03/14/2021 Name:  Brittany [name] MRN:  [id number] DOB:  Jun 13, 1945  Summary: LDL not at goal < 100 Pt is feeling ok with COVID infection  Recommendations/Changes made from today's visit: -Recommended CAC scoring for cholesterol therapy assessment -Recommended tapering Wellbutrin based on lack of efficacy -Recommended HIIT exercises for anxiety -Recommended increasing calcium to 2 tablets daily -Consider tapering Ambien to 1/2 tab and 1 tablet alternating every other day  Plan: Follow up after discussion with PCP   Subjective: [name] is an 76 y.o. year old female who is a primary patient of Brittany, [name], MD.  The CCM team was consulted for assistance with disease management and care coordination needs.    Engaged with patient by telephone for initial visit in response to provider referral for pharmacy case management and/or care coordination services.   Consent to Services:  The patient was given the following information about Chronic Care Management services today, agreed to services, and gave verbal consent: 1. CCM service includes personalized support from designated clinical staff supervised by the primary care provider, including individualized plan of care and coordination with other care providers 2. 24/7 contact phone numbers for assistance for urgent and routine care needs. 3. Service will only be billed when office clinical staff spend 20 minutes or more in a month to coordinate care. 4. Only one practitioner may furnish and bill the service in a calendar month. 5.The patient may stop CCM services at any time (effective at the end of the month) by phone call to the office staff. 6. The patient will be responsible for cost sharing (co-pay) of up to 20% of the service fee (after annual deductible is met). Patient agreed to services and consent obtained.  Patient Care Team: Brittany Macadam, MD as PCP - General (Family  Medicine) Brittany Pain, MD as PCP - Cardiology (Cardiology) Brittany Haw, MD as PCP - Electrophysiology (Cardiology) Brittany Margarita, MD as PCP - Sleep Medicine (Cardiology) Brittany, [name], MD as Consulting Physician (Hematology and Oncology) Brittany Margarita, MD as Consulting Physician (Sleep Medicine) Brittany [name], [facility] as Pharmacist (Pharmacist) Brittany Koyanagi, MD as Attending Physician (Orthopedic Surgery)  Recent office visits: 03/12/21 Brittany Mitts, MD: Patient presented for video visit for COVID 19 infection. Prescribed molnupiravir.  03/04/2021 Brittany Durand LPN - Medicare annual wellness exam   02/10/2021 Brittany Camp MD - Patient was seen for preventative health care and additional issues. Decreased Zolpidem to 2.5 - 5 mg at bedtime. Discontinued Apixaban, Hydromorphone, Methocarbamol, Ondansetron and Oxycodone. Follow up in 6 months.   11/06/2020 Brittany Camp MD - Patient was seen for bradycardia and additional issues. No medication changes. Follow up in 1 year.   09/25/2020 Brittany Camp MD - Patient was seen for dyslipidemia and additional issues. Discontinued Tramadol. Follow up in 6 months.   Recent consult visits: 01/18/2021 Brittany Melena PA-C (orthopedic surgery) - Patient was seen for history of total knee replacement, left.  No medication changes. Follow up in 3 months.   11/22/2020 Brittany Shown MD (orthorpedic) - Patient was seen for history of total knee replacement, left and additional issues. No medication changes. Follow up in 6 weeks.    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 No medication changes. Follow up in 6 months.   10/25/2020 Brittany Melena PA-C (orthopedic surgery) - Patient was seen for history  of total knee replacement, left. No medication changes. Follow up in 4 weeks.    Hospital visits: None in previous 6  months   Objective:  Lab Results  Component Value Date   CREATININE 0.74 11/20/2020   BUN 17 11/20/2020   GFR 88.91 09/25/2020   GFRNONAA >60 11/20/2020   GFRAA >60 09/19/2019   NA 138 11/20/2020   K 3.8 11/20/2020   CALCIUM 8.9 11/20/2020   CO2 24 11/20/2020   GLUCOSE 144 (H) 11/20/2020    Lab Results  Component Value Date/Time   GFR 88.91 09/25/2020 10:04 AM   GFR 79.65 07/24/2019 09:46 AM    Last diabetic Eye exam: No results found for: HMDIABEYEEXA  Last diabetic Foot exam: No results found for: HMDIABFOOTEX   Lab Results  Component Value Date   CHOL 232 (H) 09/25/2020   HDL 64.50 09/25/2020   LDLCALC 150 (H) 09/25/2020   LDLDIRECT 178.9 03/23/2013   TRIG 86.0 09/25/2020   CHOLHDL 4 09/25/2020    Hepatic Function Latest Ref Rng & Units 11/20/2020 09/25/2020 04/11/2020  Total Protein 6.5 - 8.1 g/dL 6.8 7.0 7.1  Albumin 3.5 - 5.0 g/dL 3.8 4.3 4.1  AST 15 - 41 U/L 12(L) 15 16  ALT 0 - 44 U/L '7 11 10  ' Alk Phosphatase 38 - 126 U/L 72 55 53  Total Bilirubin 0.3 - 1.2 mg/dL 0.3 0.6 0.4  Bilirubin, Direct 0.0 - 0.3 mg/dL - - -    Lab Results  Component Value Date/Time   TSH 1.44 07/24/2019 09:46 AM   TSH 1.37 10/13/2017 11:47 AM    CBC Latest Ref Rng & Units 11/20/2020 10/12/2020 09/25/2020  WBC 4.0 - 10.5 K/uL 5.9 8.8 4.3  Hemoglobin 12.0 - 15.0 g/dL 11.6(L) 11.0(L) 13.3  Hematocrit 36.0 - 46.0 % 34.0(L) 33.2(L) 39.7  Platelets 150 - 400 K/uL 250 208 282.0    Lab Results  Component Value Date/Time   VD25OH 58.83 07/24/2019 09:46 AM   VD25OH 67 08/21/2013 03:25 PM   VD25OH 67 09/12/2012 11:17 AM    Clinical ASCVD: No  The 10-year ASCVD risk score (Arnett DK, et al., 2019) is: 11.6%   Values used to calculate the score:     Age: 13 years     Sex: Female     Is Non-Hispanic African American: No     Diabetic: No     Tobacco smoker: No     Systolic Blood Pressure: 505 mmHg     Is BP treated: No     HDL Cholesterol: 64.5 mg/dL     Total Cholesterol: 232  mg/dL    Depression screen [facility] 2/9 03/04/2021 02/10/2021 11/06/2020  Decreased Interest 0 0 0  Down, Depressed, Hopeless 0 - 2  PHQ - 2 Score 0 0 2  Altered sleeping - 0 0  Tired, decreased energy - 0 0  Change in appetite - 0 0  Feeling bad or failure about yourself  - 2 1  Trouble concentrating - 0 0  Moving slowly or fidgety/restless - 0 0  Suicidal thoughts - 0 0  PHQ-9 Score - 2 3  Difficult doing work/chores - - -  Some recent data might be hidden     CHA2DS2/VAS Stroke Risk Points  Current as of yesterday     2 >= 2 Points: High Risk  1 - 1.99 Points: Medium Risk  0 Points: Low Risk    Last Change: N/A      Details    This  score determines the patient's risk of having a stroke if the  patient has atrial fibrillation.       Points Metrics  0 Has Congestive Heart Failure:  No    Current as of yesterday  0 Has Vascular Disease:  No    Current as of yesterday  0 Has Hypertension:  No    Current as of yesterday  1 Age:  34    Current as of yesterday  0 Has Diabetes:  No    Current as of yesterday  0 Had Stroke:  No  Had TIA:  No  Had Thromboembolism:  No    Current as of yesterday  1 Female:  Yes    Current as of yesterday     Social History   Tobacco Use  Smoking Status Former   Packs/day: 0.25   Years: 5.00   Pack years: 1.25   Types: Cigarettes   Quit date: 03/03/1967   Years since quitting: 54.0  Smokeless Tobacco Never  Tobacco Comments   6 cig per day when she was smoking/quit years ago   BP Readings from Last 3 Encounters:  02/10/21 116/80  11/20/20 (!) 118/53  11/06/20 (!) 100/50   Pulse Readings from Last 3 Encounters:  02/10/21 82  11/20/20 99  11/06/20 94   Wt Readings from Last 3 Encounters:  03/04/21 125 lb (56.7 kg)  02/10/21 126 lb 9.6 oz (57.4 kg)  01/09/21 123 lb (55.8 kg)   BMI Readings from Last 3 Encounters:  03/04/21 20.18 kg/m  02/10/21 20.13 kg/m  01/09/21 19.26 kg/m    Assessment/Interventions: Review of patient  past medical history, allergies, medications, health status, including review of consultants reports, laboratory and other test data, was performed as part of comprehensive evaluation and provision of chronic care management services.   SDOH:  (Social Determinants of Health) assessments and interventions performed: Yes SDOH Interventions    Flowsheet Row Most Recent Value  SDOH Interventions   Financial Strain Interventions Intervention Not Indicated  Transportation Interventions Intervention Not Indicated      SDOH Screenings   Alcohol Screen: Not on file  Depression (PHQ2-9): Low Risk    PHQ-2 Score: 0  Financial Resource Strain: Low Risk    Difficulty of Paying Living Expenses: Not hard at all  Food Insecurity: No Food Insecurity   Worried About Charity fundraiser in the Last Year: Never true   Arboriculturist in the Last Year: Never true  Housing: Not on file  Physical Activity: Sufficiently Active   Days of Exercise per Week: 5 days   Minutes of Exercise per Session: 60 min  Social Connections: Not on file  Stress: No Stress Concern Present   Feeling of Stress : Not at all  Tobacco Use: Medium Risk   Smoking Tobacco Use: Former   Smokeless Tobacco Use: Never   Passive Exposure: Not on file  Transportation Needs: No Transportation Needs   Lack of Transportation (Medical): No   Lack of Transportation (Non-Medical): No   Patient reports she was just diagnosed with COVID but is not having a fever or shortness of breath right now. She is having a cough and sore throat but reports these are manageable symptoms. Patient is also drinking plenty of fluids.  Patient is doing basically nothing which is problem because she is pretty hyper. Patient is retired and she tries to make herself busy every day. She is walking almost every day for an hour and she used to  go the gym before her knee replacements and wants to get back to doing that.  Patient is in 2 clubs and one is a garden  club and a Psychiatrist. 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 She uses Voltaren gel, which is helping and Tylenol helps some as well.  Patient hasn't felt symptoms of Afib and she had an albation 3-4 years ago and this helped.  Patient denies side effects with medications and reports the cost of her medications is not an issue.  Patient sleeps a lot better now with the Ambien and with the inspire device. She used to have trouble with both falling asleep and staying asleep. She had a lot of trouble with the CPAP and is very pleased with the inspire device. Patient went through the sleep study and had the device installed by an ENT.  Patient has some stressful events going on with her daughter and hopes therapy will help her with this.  She recently started that.    CCM Care Plan  Allergies  Allergen Reactions   Boniva [Ibandronic Acid] Nausea Only   Hydrocodone-Acetaminophen Other (See Comments)    "Does not relieve [name]", patient reported.  Can take Tylenol.   Lipitor [Atorvastatin] Other (See Comments)    Muscle and joint [name](s)   Pt request removal of this intolerance   Tramadol Other (See Comments)    "Does not help [name]."- patient reported.    Medications Reviewed Today     Reviewed by Brittany [name], [facility] (Pharmacist) on 03/14/21 at 1016  Med List Status: <None>   Medication Order Taking? Sig Documenting Provider Last Dose Status Informant  0.9 %  sodium chloride infusion [id number]   Gatha Mayer, MD  Active   acetaminophen (TYLENOL) 500 MG tablet [id number] Yes Take 500 mg by mouth every 6 (six) hours as needed. [provider] Taking Active   Biotin 5000 MCG TABS [id number] Yes Take 5,000 mcg by mouth daily. [provider] Taking Active Self  buPROPion  (WELLBUTRIN XL) 150 MG 24 hr tablet [id number] Yes TAKE 1 TABLET BY MOUTH EVERY DAY Brittany, Junell C, MD Taking Active   Calcium-Magnesium-Zinc (CAL-MAG-ZINC PO) [id number] Yes Take 1 tablet by mouth daily. [provider] Taking Active Self  Cholecalciferol (VITAMIN D) 50 MCG (2000 UT) tablet [id number] Yes Take 2,000 Units by mouth daily. [provider] Taking Active Self  citalopram (CELEXA) 40 MG tablet [id number] Yes Take 1 tablet (40 mg total) by mouth daily. Brittany Macadam, MD Taking Active   Cyanocobalamin (VITAMIN B-12 PO) [id number] Yes Take 2,500 mcg by mouth daily. [provider] Taking Active Self  denosumab (PROLIA) 60 MG/ML SOSY injection [id number]  Inject 60 mg into the skin every 6 (six) months. [provider]  Active Self  diclofenac Sodium (VOLTAREN) 1 % GEL [id number] Yes Apply 2 g topically 4 (four) times daily. [provider] Taking Active   diphenhydrAMINE (BENADRYL) 25 MG tablet [id number] Yes Take 25 mg by mouth every 6 (six) hours as needed for allergies. [provider] Taking Active Self  famotidine (PEPCID) 40 MG tablet [id number] Yes TAKE 1 TABLET BY MOUTH EVERY DAY  Patient taking differently: Take 40 mg by mouth daily.   Brittany Macadam, MD Taking Active   fluticasone (  FLONASE) 50 MCG/ACT nasal spray [id number] Yes SPRAY 2 SPRAYS INTO EACH NOSTRIL EVERY DAY Brittany, Junell C, MD Taking Active   molnupiravir EUA (LAGEVRIO) 200 mg CAPS capsule [id number]  Take 4 capsules (800 mg total) by mouth 2 (two) times daily for 5 days. Billie Ruddy, MD  Active   Omega-3 Fatty Acids (FISH OIL) 1200 MG CAPS [id number] Yes Take 1,200 mg by mouth daily. [provider] Taking Active Self  Probiotic Product (PROBIOTIC DAILY PO) [id number] Yes Take 1 tablet by mouth daily. [provider] Taking Active Self  SUMAtriptan (IMITREX) 100 MG tablet [id number] Yes TAKE 1 TABLET BY MOUTH ONCE FOR 1 DOSE. MAY REPEAT  IN 2 HOURS IF HEADACHE PERSISTS / RECURS  Patient taking differently: Take 100 mg by mouth every 2 (two) hours as needed for migraine.   Plotnikov, Evie Lacks, MD Taking Active   zolpidem (AMBIEN) 5 MG tablet [id number] Yes Take 0.5-1 tablets (2.5-5 mg total) by mouth at bedtime as needed for sleep. 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[place]) 04/06/2016   S/P right TKA 12/31/2015   Baker's cyst, unruptured 09/27/2015   Insomnia 07/31/2015   Onychomycosis 09/25/2014   Urinary frequency 05/21/2014   Mitral valve prolapse 05/08/2014   Mitral regurgitation 05/08/2014   Dyslipidemia 02/09/2014   History of radiation therapy    Hiatal hernia    History of chemotherapy    Osteoporosis 03/21/2012   Seasonal and perennial allergic rhinitis 12/17/2011   B12 deficiency 10/09/2009   TOBACCO USE, QUIT 01/31/2009   THYROID NODULE 12/25/2008   GERD 11/23/2007   VARICOSE VEIN 12/29/2006   Anxiety state 12/23/2006   Depression 12/23/2006   Migraine variant 12/23/2006   Disorder of bone and cartilage 12/23/2006    Immunization History  Administered Date(s) Administered   Fluad Quad(high Dose 65+) 11/06/2020   Influenza Split 12/01/2011   Influenza Whole 12/06/2007, 11/15/2008, 01/06/2010, 11/11/2010   Influenza, High Dose Seasonal PF 12/20/2012, 11/20/2014, 11/21/2017, 11/07/2019   Influenza,inj,Quad PF,6+ Mos 11/30/2013   Influenza,inj,quad,  With Preservative 11/02/2016   Influenza-Unspecified 11/27/2015, 10/31/2016, 10/27/2018   Moderna Covid-19 Vaccine Bivalent Booster 19yr & up 02/15/2021   Moderna Sars-Covid-2 Vaccination 03/30/2019, 04/27/2019, 01/08/2020, 09/30/2020   PNEUMOCOCCAL CONJUGATE-20 02/10/2021   Pneumococcal Conjugate-13 03/22/2013   Pneumococcal Polysaccharide-23 01/31/2009, 07/31/2015, 06/20/2019   Rabies, IM 03/04/2015, 03/07/2015, 03/11/2015, 03/18/2015   Td 10/09/2009   Tdap 03/04/2015   Zoster Recombinat (Shingrix) 02/13/2017, 07/03/2017   Zoster, Live 04/18/2009   Conditions to be addressed/monitored:  Hyperlipidemia, Atrial Fibrillation, GERD, Depression, Anxiety, and Osteoporosis  Care Plan : CCM Pharmacy Care Plan  Updates made by PViona [name] R[employer]since 03/14/2021 12:00 AM     Problem: Problem: Hyperlipidemia, Atrial Fibrillation, GERD, Depression, Anxiety, and Osteoporosis      Long-Range Goal: Patient-Specific Goal   Start Date: 03/14/2021  Expected End Date: 03/14/2022  This Visit's Progress: On track  Priority: High  Note:   Current Barriers:  Unable to independently monitor therapeutic efficacy Unable to maintain control of cholesterol  Pharmacist Clinical Goal(s):  Patient will achieve adherence to monitoring guidelines and medication adherence to achieve therapeutic efficacy maintain control of cholesterol as evidenced by next lipid panel  through collaboration with PharmD and provider.   Interventions: 1:1 collaboration with Brittany Macadam, MD regarding development and update of comprehensive plan of care as evidenced by provider attestation and co-signature Inter-disciplinary care team collaboration (see longitudinal plan of care) Comprehensive medication review performed; medication list updated in electronic medical record  Hyperlipidemia: (LDL goal < 100) -Uncontrolled -Current treatment: No medications -Medications previously tried: atorvastatin (myalgias)   -Current dietary patterns: limits fried foods -Current exercise habits: walking daily -Educated on Cholesterol goals;  Benefits of statin for ASCVD risk reduction; Importance of limiting foods high in cholesterol; -Counseled on diet and exercise extensively Recommended CAC scoring to determine risk for needing statin therapy.  Atrial Fibrillation (Goal: prevent stroke and major bleeding) -Controlled -CHADSVASC: 2 -Current treatment: Rate control: none Anticoagulation: none -Medications previously tried: Eliquis & metoprolol (low risk; stopped by patient) -Home BP and HR readings: uses apple watch to monitor her HR  -Counseled on Afib action plan reviewed -Recommended monitoring heart rate and considering metoprolol if HR is > 100.  Depression/Anxiety (Goal: minimize symptoms) -Not ideally controlled -Current treatment: Citalopram 40 mg 1 tablet daily - appropriate, query effective Bupropion XL 150 mg 1 tablet daily - appropriate, query effective -Medications previously tried/failed: n/a -PHQ9: 0 -GAD7: n/a -Educated on Benefits of medication for symptom control Benefits of cognitive-behavioral therapy with or without medication -Counseled on diet and exercise extensively Recommended HIIT exercises for anxiety. Collaborated with PCP to consider Wellbutrin taper or escalation.  Insomnia (Goal: improve quality and quantity of sleep) -Controlled -Current treatment  Zolpidem 5 mg 1/2 to 1 tablet at bedtime as needed - appropriate, effective, query safe -Medications previously tried: Valerian root, melatonin (didn't help)  -Counseled on risks vs benefits with Ambien and consider going to 1/2 tablet every other day alternating with 1 tablet to see how patient tolerates.  Osteoporosis (Goal prevent fractures) -Controlled -Last DEXA Scan: 05/05/19   T-Score femoral neck: -2.3  T-Score total hip: -2.3  T-Score lumbar spine: -1.2  T-Score forearm radius: n/a  10-year probability  of major osteoporotic fracture: n/a  10-year probability of hip fracture: n/a -Patient is a candidate for pharmacologic treatment due to T-Score < -2.5 in femoral neck and T-Score < -2.5 in total hip  -Current treatment  Prolia inject 60 mg every 6 months (last 11/20/20) - appropriate, effective, safe, accessible Vitamin D 5000 units daily Calcium-mag-zinc 333 mg-133-5 mg 1 tablet daily  -Medications previously tried: Boniva (nausea)  -Recommend [phone] units of vitamin D daily. Recommend 1200 mg of calcium daily from dietary and supplemental sources. Recommend weight-bearing and muscle strengthening exercises for building and maintaining bone density. -Counseled on diet and exercise extensively Recommended to continue current medication Recommended increasing to 2 calcium tablets daily based on current intake.  GERD (Goal: minimize symptoms) -Controlled -Current treatment  Famotidine 40 mg 1 tablet daily  - appropriate, effective, safe, accessible -Medications previously tried: Zantac (removed from market) -Recommended to continue current medication Patient is on due to difficulty swallowing.  Allergic rhinitis (Goal: minimize symptoms) -Controlled -Current treatment  Fluticasone 50 mcg/act 2 sprays in each nostril once daily  - appropriate, effective, safe, accessible Diphenhydramine 25 mg as needed  - appropriate, effective, query  safe -Medications previously tried: Claritin, Zyrtec (ineffective)  -Counseled on sedation and fall risk with Benadryl and recommended using sparingly.    Health Maintenance -Vaccine gaps: none -Current therapy:  Sumatriptan 100 mg 1 tablet as needed Biotin 5000 mcg 1 tablet daily Probiotic daily Vitamin B12 2500 mcg 1 tablet daily -Educated on Cost vs benefit of each product must be carefully weighed by individual consumer -Patient is satisfied with current therapy and denies issues -Recommended to continue current medication  Patient  Goals/Self-Care Activities Patient will:  - take medications as prescribed as evidenced by patient report and record review target a minimum of 150 minutes of moderate intensity exercise weekly  Follow Up Plan: The care management team will reach out to the patient again over the next 30 days.       Medication Assistance: None required.  Patient affirms current coverage meets needs.  Compliance/Adherence/Medication fill history: Care Gaps: Last BP - 116/80 on 02/10/2021  Star-Rating Drugs: None  Patient's preferred pharmacy is:  [facility] [place], [place] [address]   [id number] Phone: [phone] Fax: [phone]  Uses pill box? No - has them in the drawer and lined up Pt endorses 100% compliance  We discussed: Current pharmacy is preferred with insurance plan and patient is satisfied with pharmacy services Patient decided to: Continue current medication management strategy  Care Plan and Follow Up Patient Decision:  Patient agrees to Care Plan and Follow-up.  Plan: The care management team will reach out to the patient again over the next 30 days.  Jeni Salles, PharmD, [place] Pharmacist [place] [facility] [place]

## 2021-03-21 ENCOUNTER — Telehealth: Payer: Self-pay | Admitting: Pharmacist

## 2021-03-21 NOTE — Telephone Encounter (Signed)
Called patient back as she left a voicemail for me. Informed patient that CAC scoring was ordered and she should be getting a phone call from [employer] [place] office to schedule.   Per discussion with PCP, patient is good to go ahead and taper off the Wellbutrin as she doesn't feel like this helps. Plan to taper to every other day x 1 week and then stop. Patient verbalized her understanding.  Will plan for follow up assessment in 1 month to see if patient feels any different at that time.

## 2021-03-22 ENCOUNTER — Other Ambulatory Visit: Payer: Self-pay | Admitting: Family Medicine

## 2021-03-26 ENCOUNTER — Telehealth: Payer: Self-pay | Admitting: Family Medicine

## 2021-03-26 DIAGNOSIS — Z87891 Personal history of nicotine dependence: Secondary | ICD-10-CM | POA: Diagnosis not present

## 2021-03-26 DIAGNOSIS — D6869 Other thrombophilia: Secondary | ICD-10-CM | POA: Diagnosis not present

## 2021-03-26 DIAGNOSIS — Z9011 Acquired absence of right breast and nipple: Secondary | ICD-10-CM | POA: Diagnosis not present

## 2021-03-26 DIAGNOSIS — Z853 Personal history of malignant neoplasm of breast: Secondary | ICD-10-CM | POA: Diagnosis not present

## 2021-03-26 DIAGNOSIS — I480 Paroxysmal atrial fibrillation: Secondary | ICD-10-CM | POA: Diagnosis not present

## 2021-03-26 NOTE — Telephone Encounter (Signed)
Pt call and stated she want a call back about how she is suppose to go off Bupropion .

## 2021-03-28 ENCOUNTER — Other Ambulatory Visit: Payer: Self-pay | Admitting: Family Medicine

## 2021-03-28 MED ORDER — BUPROPION HCL 75 MG PO TABS: ORAL_TABLET | ORAL | 0 refills | Status: DC

## 2021-03-28 NOTE — Telephone Encounter (Signed)
Patient called and was following up on prescription since she was out and about.

## 2021-03-28 NOTE — Telephone Encounter (Signed)
I will send her a tapering dose in to the pharmacy with a new dose so that it will be easier to ease off medication. I'll include directions for doing this on the prescription.

## 2021-03-28 NOTE — Telephone Encounter (Signed)
Patient informed of the message below.

## 2021-04-01 DIAGNOSIS — I480 Paroxysmal atrial fibrillation: Secondary | ICD-10-CM | POA: Diagnosis not present

## 2021-04-09 ENCOUNTER — Other Ambulatory Visit: Payer: Self-pay | Admitting: Family Medicine

## 2021-04-10 ENCOUNTER — Encounter: Payer: Self-pay | Admitting: Orthopaedic Surgery

## 2021-04-10 ENCOUNTER — Other Ambulatory Visit: Payer: Self-pay

## 2021-04-10 ENCOUNTER — Ambulatory Visit (INDEPENDENT_AMBULATORY_CARE_PROVIDER_SITE_OTHER): Payer: PPO

## 2021-04-10 ENCOUNTER — Ambulatory Visit: Payer: PPO | Admitting: Orthopaedic Surgery

## 2021-04-10 ENCOUNTER — Ambulatory Visit (HOSPITAL_COMMUNITY)
Admission: RE | Admit: 2021-04-10 | Discharge: 2021-04-10 | Disposition: A | Discharge status: Home / Self Care | Payer: PPO | Source: Ambulatory Visit | Attending: Orthopaedic Surgery | Admitting: Orthopaedic Surgery

## 2021-04-10 DIAGNOSIS — Z96652 Presence of left artificial knee joint: Secondary | ICD-10-CM | POA: Insufficient documentation

## 2021-04-10 MED ORDER — AMOXICILLIN 500 MG PO CAPS: 2000.0000 mg | ORAL_CAPSULE | Freq: Once | ORAL | 6 refills | Status: AC

## 2021-04-10 NOTE — Progress Notes (Signed)
Post-Op Visit Note   Patient: Brittany [name]           Date of Birth: [birth year]           MRN: [id number] Visit Date: 04/10/2021 PCP: Wynn Banker, MD   Assessment & Plan:  Chief Complaint:  Chief Complaint  Patient presents with   Left Knee - Pain   Visit Diagnoses:  1. History of total knee replacement, left     Plan: Brittany [name] is now 6 months status post left total knee replacement.  She has no complaints other than some tightness in the back of the knee for the last month.  Overall doing well.  She feels like she may be having the symptoms because of having to compensate for a chronically painful right knee.  Examination of left knee shows a fully healed surgical scar.  She has excellent range of motion.  Stable to varus valgus stress.  No joint effusion.  No palpable masses in the popliteal space.  No calf tenderness.  [place] has done well from the knee replacement.  We will go ahead and obtain a Doppler to rule out DVT which I highly doubt but is good to make sure.  Dental prophylaxis reinforced.  Amoxicillin was sent in today.  We will notify Brittany [name] of the results.  Recheck in 6 months with two-view x-rays of the left knee.  Follow-Up Instructions: Return in about 6 months (around 10/08/2021).   Orders:  Orders Placed This Encounter  Procedures   XR Knee 1-2 Views Left   Meds ordered this encounter  Medications   amoxicillin (AMOXIL) 500 MG capsule    Sig: Take 4 capsules (2,000 mg total) by mouth once for 1 dose.    Dispense:  4 capsule    Refill:  6    Imaging: XR Knee 1-2 Views Left  Result Date: 04/10/2021 Stable total knee replacement in good alignment.   VAS [place] LOWER EXTREMITY VENOUS (DVT)  Result Date: 04/10/2021  Lower Venous DVT Study Patient Name:  Brittany [name]  Date of Exam:   04/10/2021 Medical Rec #: [id number]     Accession #:    [id number] Date of Birth: 1945/10/05     Patient Gender: F Patient Age:   76 years Exam Location:  [facility]  Procedure:      VAS [place] LOWER EXTREMITY VENOUS (DVT) Referring Phys: Coralyn Mark Clemons Salvucci --------------------------------------------------------------------------------  Indications: Tightness in the posterior leg.  Comparison Study: No prior study Performing Technologist: Sherren Kerns RVS  Examination Guidelines: A complete evaluation includes B-mode imaging, spectral Doppler, color Doppler, and power Doppler as needed of all accessible portions of each vessel. Bilateral testing is considered an integral part of a complete examination. Limited examinations for reoccurring indications may be performed as noted. The reflux portion of the exam is performed with the patient in reverse Trendelenburg.  +-----+---------------+---------+-----------+----------+--------------+  RIGHT Compressibility Phasicity Spontaneity Properties Thrombus Aging  +-----+---------------+---------+-----------+----------+--------------+  CFV   Full            Yes       Yes                                    +-----+---------------+---------+-----------+----------+--------------+   +---------+---------------+---------+-----------+----------+--------------+  LEFT      Compressibility Phasicity Spontaneity Properties Thrombus Aging  +---------+---------------+---------+-----------+----------+--------------+  CFV       Full  Yes       Yes                                    +---------+---------------+---------+-----------+----------+--------------+  SFJ       Full                                                             +---------+---------------+---------+-----------+----------+--------------+  FV Prox   Full                                                             +---------+---------------+---------+-----------+----------+--------------+  FV Mid    Full                                                             +---------+---------------+---------+-----------+----------+--------------+  FV Distal Full                                                              +---------+---------------+---------+-----------+----------+--------------+  PFV       Full                                                             +---------+---------------+---------+-----------+----------+--------------+  POP       Full            Yes       Yes                                    +---------+---------------+---------+-----------+----------+--------------+  PTV       Full                                                             +---------+---------------+---------+-----------+----------+--------------+  PERO      Full                                                             +---------+---------------+---------+-----------+----------+--------------+  Gastroc   Full                                                             +---------+---------------+---------+-----------+----------+--------------+  Summary: RIGHT: - No evidence of common femoral vein obstruction.  LEFT: - There is no evidence of deep vein thrombosis in the lower extremity. However, portions of this examination were limited- see technologist comments above.  *See table(s) above for measurements and observations.    Preliminary     PMFS History: Patient Active Problem List   Diagnosis Date Noted   OSA (obstructive sleep apnea) 01/09/2021   Status post total left knee replacement 10/11/2020   Primary osteoarthritis of left knee 10/02/2020   Atrial fibrillation (HCC) 05/13/2018   Bloating 04/11/2018   Abnormal EKG 08/23/2017   Malignant neoplasm of overlapping sites of right breast in female, estrogen receptor positive (HCC) 08/17/2016   Hx of colonic polyps 08/11/2016   Iron deficiency anemia 07/28/2016   Blood donor 07/28/2016   Pain due to total right knee replacement (HCC) 04/06/2016   S/P right TKA 12/31/2015   Baker's cyst, unruptured 09/27/2015   Insomnia 07/31/2015   Onychomycosis 09/25/2014   Urinary frequency 05/21/2014   Mitral valve prolapse 05/08/2014   Mitral  regurgitation 05/08/2014   Dyslipidemia 02/09/2014   History of radiation therapy    Hiatal hernia    History of chemotherapy    Osteoporosis 03/21/2012   Seasonal and perennial allergic rhinitis 12/17/2011   B12 deficiency 10/09/2009   TOBACCO USE, QUIT 01/31/2009   THYROID NODULE 12/25/2008   GERD 11/23/2007   VARICOSE VEIN 12/29/2006   Anxiety state 12/23/2006   Depression 12/23/2006   Migraine variant 12/23/2006   Disorder of bone and cartilage 12/23/2006   Past Medical History:  Diagnosis Date   A-fib (HCC)    Acid reflux    Acute cystitis with positive culture 03/06/2014   Anemia    Anxiety    Arthritis    Baker cyst, left 04/06/2016   Aspirated 04/06/2016   Breast cancer (HCC) 11/23/2007   right, ER/PR -, HER 2 -   Cognitive deficits 09/25/2014   2016 Very mild, could be related to meds    Degenerative arthritis of left knee 04/06/2016   Injection and aspiration 04/06/2016   Degenerative arthritis of right knee 09/27/2015   Depression    Esophageal stricture    Gastritis 10/2007   History of chemotherapy    neoadjuvant chemo, last dose 03/2008   History of radiation therapy 07/25/08 -09/11/08   right breast   Hx of colonic polyps 08/11/2016   Hyperlipidemia    Migraines    rare   Myalgia 07/18/2013   5/15 multiple site    OSA (obstructive sleep apnea)    severe OSA AHI 34, intolerant to PAP and now s/p hypoglossal nerve stimulator implant   Osteopenia    Rupture of quadriceps tendon 03/23/2017    Family History  Problem Relation Age of Onset   Heart disease Father    Cancer Father        lung-nonsmoker   Breast cancer Sister 65   Colon cancer Sister 54   Epilepsy Mother        at older age   Dementia Mother        ?uncertain type   Hip fracture Mother        x2   Other Brother        suicide   Other Maternal Grandfather        didn't go to doctor   Esophageal cancer Neg Hx    Stomach cancer Neg Hx     Past Surgical History:  Procedure  Laterality Date  ATRIAL FIBRILLATION ABLATION N/A 10/07/2018   Procedure: ATRIAL FIBRILLATION ABLATION;  Surgeon: Regan Lemming, MD;  Location: [facility];  Service: Cardiovascular;  Laterality: N/A;   BREAST LUMPECTOMY  06/26/2008   right, CA   XRT, chemo   COLONOSCOPY     DRUG INDUCED ENDOSCOPY N/A 02/21/2020   Procedure: DRUG INDUCED ENDOSCOPY;  Surgeon: Christia Reading, MD;  Location: [facility];  Service: ENT;  Laterality: N/A;   EYE SURGERY Bilateral    growth removed per right thigh     as teen - mole   IMPLANTATION OF HYPOGLOSSAL NERVE STIMULATOR N/A 05/15/2020   Procedure: IMPLANTATION OF HYPOGLOSSAL NERVE STIMULATOR;  Surgeon: Christia Reading, MD;  Location: [facility] OR;  Service: ENT;  Laterality: N/A;   JOINT REPLACEMENT  12/31/2015   RTK   QUADRICEPS TENDON REPAIR Right 09/14/2016   Procedure: OPEN REPAIR QUADRICEP TENDON;  Surgeon: Durene Romans, MD;  Location: [facility];  Service: Orthopedics;  Laterality: Right;   TOTAL KNEE ARTHROPLASTY Right 12/31/2015   Procedure: RIGHT TOTAL KNEE ARTHROPLASTY;  Surgeon: Durene Romans, MD;  Location: [facility];  Service: Orthopedics;  Laterality: Right;   TOTAL KNEE ARTHROPLASTY Left 10/11/2020   Procedure: TOTAL KNEE ARTHROPLASTY;  Surgeon: Tarry Kos, MD;  Location: [facility];  Service: Orthopedics;  Laterality: Left;   UPPER GASTROINTESTINAL ENDOSCOPY     Social History   Occupational History    Employer: RETIRED  Tobacco Use   Smoking status: Former    Packs/day: 0.25    Years: 5.00    Pack years: 1.25    Types: Cigarettes    Quit date: 03/03/1967    Years since quitting: 54.1   Smokeless tobacco: Never   Tobacco comments:    6 cig per day when she was smoking/quit years ago  Vaping Use   Vaping Use: Never used  Substance and Sexual Activity   Alcohol use: Yes    Alcohol/week: 7.0 standard drinks    Types: 7 Cans of beer per week    Comment: 1 beer per day - 2.4 % alcholol - [employer] 55   Drug use: No    Sexual activity: Yes    Partners: Male    Birth control/protection: Post-menopausal

## 2021-04-10 NOTE — Progress Notes (Signed)
VASCULAR LAB    Left lower extremity venous duplex has been performed.  See CV proc for preliminary results.   Left message on triage voicemail  Sharion Dove, RVT 04/10/2021, 2:26 PM

## 2021-04-10 NOTE — Progress Notes (Signed)
Called patient to discuss results.  Recommend calf and achilles stretches for now.  She'll let me know if she continues to have symptoms.

## 2021-04-22 ENCOUNTER — Ambulatory Visit (INDEPENDENT_AMBULATORY_CARE_PROVIDER_SITE_OTHER)
Admission: RE | Admit: 2021-04-22 | Discharge: 2021-04-22 | Disposition: A | Discharge status: Home / Self Care | Payer: Self-pay | Source: Ambulatory Visit | Attending: Family Medicine | Admitting: Family Medicine

## 2021-04-22 ENCOUNTER — Other Ambulatory Visit: Payer: Self-pay

## 2021-04-22 DIAGNOSIS — I700 Atherosclerosis of aorta: Secondary | ICD-10-CM

## 2021-04-23 ENCOUNTER — Encounter: Payer: Self-pay | Admitting: Family Medicine

## 2021-04-23 ENCOUNTER — Telehealth: Payer: Self-pay | Admitting: Pharmacist

## 2021-04-23 ENCOUNTER — Ambulatory Visit (INDEPENDENT_AMBULATORY_CARE_PROVIDER_SITE_OTHER): Payer: PPO | Admitting: Family Medicine

## 2021-04-23 VITALS — BP 102/50 | HR 81 | Temp 97.9°F | Ht 66.0 in | Wt 129.6 lb

## 2021-04-23 DIAGNOSIS — I700 Atherosclerosis of aorta: Secondary | ICD-10-CM | POA: Diagnosis not present

## 2021-04-23 DIAGNOSIS — R051 Acute cough: Secondary | ICD-10-CM | POA: Diagnosis not present

## 2021-04-23 MED ORDER — ALBUTEROL SULFATE (2.5 MG/3ML) 0.083% IN NEBU
2.5000 mg | INHALATION_SOLUTION | Freq: Once | RESPIRATORY_TRACT | Status: AC
  Administered 2021-04-23: 2.5 mg via RESPIRATORY_TRACT

## 2021-04-23 NOTE — Progress Notes (Signed)
Brittany [name] DOB: [birth year] Encounter date: 04/23/2021  This is a 76 y.o. female who presents with Chief Complaint  Patient presents with   Cough    Non-productive x1 month, tried cough drops with no relief    History of present illness: Had Eddy in January. Coughed so much hurt to even touch chest. Horrible sore throat. Now will be fine, but then has multiple coughing spasms through day and night. Husband worried about getting evaluated; wanted her to have chest evaluated.   She still feels like she has some congestion, throat clearing. No hx of prolonged cough or inhaler use.   No fevers. Energy level ok.    Allergies  Allergen Reactions   Boniva [Ibandronic Acid] Nausea Only   Hydrocodone-Acetaminophen Other (See Comments)    "Does not relieve pain", patient reported.  Can take Tylenol.   Lipitor [Atorvastatin] Other (See Comments)    Muscle and joint pain(s)   Pt request removal of this intolerance   Tramadol Other (See Comments)    "Does not help pain."- patient reported.   Current Meds  Medication Sig   acetaminophen (TYLENOL) 500 MG tablet Take 500 mg by mouth every 6 (six) hours as needed.   Biotin 5000 MCG TABS Take 5,000 mcg by mouth daily.   Calcium-Magnesium-Zinc (CAL-MAG-ZINC PO) Take 1 tablet by mouth daily.   Cholecalciferol (VITAMIN D) 50 MCG (2000 UT) tablet Take 2,000 Units by mouth daily.   citalopram (CELEXA) 40 MG tablet TAKE 1 TABLET BY MOUTH EVERY DAY   Cyanocobalamin (VITAMIN B-12 PO) Take 2,500 mcg by mouth daily.   denosumab (PROLIA) 60 MG/ML SOSY injection Inject 60 mg into the skin every 6 (six) months.   diclofenac Sodium (VOLTAREN) 1 % GEL Apply 2 g topically 4 (four) times daily.   diphenhydrAMINE (BENADRYL) 25 MG tablet Take 25 mg by mouth every 6 (six) hours as needed for allergies.   famotidine (PEPCID) 40 MG tablet Take 1 tablet (40 mg total) by mouth daily.   fluticasone (FLONASE) 50 MCG/ACT nasal spray SPRAY 2 SPRAYS INTO EACH NOSTRIL  EVERY DAY   Omega-3 Fatty Acids (FISH OIL) 1200 MG CAPS Take 1,200 mg by mouth daily.   Probiotic Product (PROBIOTIC DAILY PO) Take 1 tablet by mouth daily.   SUMAtriptan (IMITREX) 100 MG tablet TAKE 1 TABLET BY MOUTH ONCE FOR 1 DOSE. MAY REPEAT IN 2 HOURS IF HEADACHE PERSISTS / RECURS (Patient taking differently: Take 100 mg by mouth every 2 (two) hours as needed for migraine.)   zolpidem (AMBIEN) 5 MG tablet Take 0.5-1 tablets (2.5-5 mg total) by mouth at bedtime as needed for sleep.   Current Facility-Administered Medications for the 04/23/21 encounter (Office Visit) with [name], MD  Medication   0.9 %  sodium chloride infusion    Review of Systems  Constitutional:  Negative for chills, fatigue and fever.  HENT:  Negative for congestion, ear pain, rhinorrhea, sinus pressure and sinus pain.   Respiratory:  Positive for cough. Negative for chest tightness, shortness of breath and wheezing.   Cardiovascular:  Negative for chest pain, palpitations and leg swelling.   Objective:  BP (!) 102/50 (BP Location: Left Arm, Patient Position: Sitting, Cuff Size: Normal)    Pulse 81    Temp 97.9 F (36.6 C) (Oral)    Ht 5\' 6"  (1.676 m)    Wt 129 lb 9.6 oz (58.8 kg)    LMP 03/02/1994 (Approximate)    SpO2 97%    BMI 20.92  kg/m   Weight: 129 lb 9.6 oz (58.8 kg)   BP Readings from Last 3 Encounters:  04/23/21 (!) 102/50  02/10/21 116/80  11/20/20 (!) 118/53   Wt Readings from Last 3 Encounters:  04/23/21 129 lb 9.6 oz (58.8 kg)  03/04/21 125 lb (56.7 kg)  02/10/21 126 lb 9.6 oz (57.4 kg)    Physical Exam Constitutional:      General: She is not in acute distress.    Appearance: She is well-developed.  Cardiovascular:     Rate and Rhythm: Normal rate and regular rhythm.     Heart sounds: Normal heart sounds. No murmur heard.   No friction rub.  Pulmonary:     Effort: Pulmonary effort is normal. No respiratory distress.     Breath sounds: Decreased breath sounds (very  slightly) present. No wheezing or rales.  Musculoskeletal:     Right lower leg: No edema.     Left lower leg: No edema.  Neurological:     Mental Status: She is alert and oriented to person, place, and time.  Psychiatric:        Behavior: Behavior normal.    Assessment/Plan  1. Acute cough Suspect normal post-viral cough. Of note, coronary CT completed yesterday and pulmonary over read was normal, which is reassuring. This was discussed with patient. Given albuterol neb in office. Let me know if this helps with cough, we could send inhaler for home use. Discussed consideration for tessalon perles as well. Suspec that she will continue to improve with resolution of cough in next couple of weeks. She is ok with monitoring self at this point and will reach out with concerns. - albuterol (PROVENTIL) (2.5 MG/3ML) 0.083% nebulizer solution 2.5 mg  2. Atherosclerosis of aorta ([place]) We discussed this in detail. She prefers to avoid statin. Her ascvd scoring is 7. Her coronary calcium score is 0. At this time, do not feel that she needs to start statin.    Return if symptoms worsen or fail to improve.  26 minutes spent with patient. Discussed post viral cough, treatment options, discussed coronary calcium scoring results, ascvd scoring/recommendations.   [name], MD

## 2021-04-23 NOTE — Chronic Care Management (AMB) (Signed)
° ° °  Chronic Care Management Pharmacy Assistant   Name: Brittany [name]  MRN: [id number] DOB: Jul 03, 1945  Reason for Encounter: Follow up Wellbutrin taper for mood/anxiety.  Spoke with patient, she states she is doing well, [place] taper worked and she was able to taper off and is feeling fine.   Care Gaps: AWV - completed 03/04/2021 Last BP - 116/80 on 02/10/2021  Star Rating Drugs: None  [name]  Clinical Pharmacist Assistant [phone]

## 2021-04-26 ENCOUNTER — Encounter: Payer: Self-pay | Admitting: Family Medicine

## 2021-05-06 ENCOUNTER — Other Ambulatory Visit: Payer: Self-pay

## 2021-05-06 ENCOUNTER — Ambulatory Visit: Payer: PPO | Admitting: Cardiology

## 2021-05-06 ENCOUNTER — Encounter: Payer: Self-pay | Admitting: Cardiology

## 2021-05-06 VITALS — BP 104/62 | HR 68 | Ht 66.0 in | Wt 124.2 lb

## 2021-05-06 DIAGNOSIS — G4733 Obstructive sleep apnea (adult) (pediatric): Secondary | ICD-10-CM

## 2021-05-06 DIAGNOSIS — I480 Paroxysmal atrial fibrillation: Secondary | ICD-10-CM | POA: Diagnosis not present

## 2021-05-06 NOTE — Progress Notes (Signed)
Date:  05/06/2021   ID:  Brittany [name], DOB May 02, 1945, MRN [id number] The patient was identified using 2 identifiers.  PCP:  Brittany Macadam, MD   [facility] Providers Cardiologist:  Brittany Furbish, MD Electrophysiologist:  Brittany Meredith Leeds, MD  Sleep Medicine:  Brittany Him, MD     Evaluation Performed:  Follow-Up Visit  Chief Complaint:  OSA  History of Present Illness:    Brittany [name] is a 76 y.o. female with who is followed by Dr. Marlou [name] for atrial fibrillation who was referred for sleep study.  She underwent home sleep study which showed severe obstructive sleep apnea with an AHI of 34.4/h and moderate oxygen desaturations to 81%.  She underwent CPAP titration to 12 cm H2O.   She was referred her to ENT for evaluation for the hypoglossal nerve stimulator.  She underwent Inspire device implantation 05/15/2020. She underwent in lab titration 01/09/2021 with a therapeutic amplitude of 1.4V with an AHI of 7.5/hr and O2 sats as low as 88%.  Her device was programmed to amplitude of 1.3V   She is now here for evaluation and is doing great today.  She is currently on amplitude of 1.5 V and is doing great.  She sleeps great through the night and feels rested in the morning with no excessive daytime sleepiness.  She uses her device 7.9 hours nightly and is 96% compliant in using greater than 4 hours nightly.  Her sleep study was reviewed today and showed that at 1.3 V her AHI was 1.6 and at 1.5 V her AHI jumped to 8.9/h but she feels like she is sleeping well at that level.  She has no tongue sensitivity.   Past Medical History:  Diagnosis Date   A-fib ([place])    Acid reflux    Acute cystitis with positive culture 03/06/2014   Anemia    Anxiety    Arthritis    Baker cyst, left 04/06/2016   Aspirated 04/06/2016   Breast cancer ([place]) 11/23/2007   right, ER/PR -, HER 2 -   Cognitive deficits 09/25/2014   2016 Very mild, could be related to meds    Degenerative arthritis of  left knee 04/06/2016   Injection and aspiration 04/06/2016   Degenerative arthritis of right knee 09/27/2015   Depression    Esophageal stricture    Gastritis 10/2007   History of chemotherapy    neoadjuvant chemo, last dose 03/2008   History of radiation therapy 07/25/08 -09/11/08   right breast   Hx of colonic polyps 08/11/2016   Hyperlipidemia    Migraines    rare   Myalgia 07/18/2013   5/15 multiple site    OSA (obstructive sleep apnea)    severe OSA AHI 34, intolerant to PAP and now s/p hypoglossal nerve stimulator implant   Osteopenia    Rupture of quadriceps tendon 03/23/2017   Past Surgical History:  Procedure Laterality Date   ATRIAL FIBRILLATION ABLATION N/A 10/07/2018   Procedure: ATRIAL FIBRILLATION ABLATION;  Surgeon: [name], MD;  Location: [facility];  Service: Cardiovascular;  Laterality: N/A;   BREAST LUMPECTOMY  06/26/2008   right, CA   XRT, chemo   COLONOSCOPY     DRUG INDUCED ENDOSCOPY N/A 02/21/2020   Procedure: DRUG INDUCED ENDOSCOPY;  Surgeon: Melida Quitter, MD;  Location: [place];  Service: ENT;  Laterality: N/A;   EYE SURGERY Bilateral    growth removed per right thigh     as  teen - mole   IMPLANTATION OF HYPOGLOSSAL NERVE STIMULATOR N/A 05/15/2020   Procedure: IMPLANTATION OF HYPOGLOSSAL NERVE STIMULATOR;  Surgeon: Melida Quitter, MD;  Location: [facility];  Service: ENT;  Laterality: N/A;   JOINT REPLACEMENT  12/31/2015   RTK   QUADRICEPS TENDON REPAIR Right 09/14/2016   Procedure: OPEN REPAIR QUADRICEP TENDON;  Surgeon: [name], MD;  Location: [facility];  Service: Orthopedics;  Laterality: Right;   TOTAL KNEE ARTHROPLASTY Right 12/31/2015   Procedure: RIGHT TOTAL KNEE ARTHROPLASTY;  Surgeon: [name], MD;  Location: [facility];  Service: Orthopedics;  Laterality: Right;   TOTAL KNEE ARTHROPLASTY Left 10/11/2020   Procedure: TOTAL KNEE ARTHROPLASTY;  Surgeon: [name], MD;  Location: [facility];  Service:  Orthopedics;  Laterality: Left;   UPPER GASTROINTESTINAL ENDOSCOPY       Current Meds  Medication Sig   acetaminophen (TYLENOL) 500 MG tablet Take 500 mg by mouth every 6 (six) hours as needed.   Biotin 5000 MCG TABS Take 5,000 mcg by mouth daily.   Calcium-Magnesium-Zinc (CAL-MAG-ZINC PO) Take 1 tablet by mouth daily.   Cholecalciferol (VITAMIN D) 50 MCG (2000 UT) tablet Take 2,000 Units by mouth daily.   citalopram (CELEXA) 40 MG tablet TAKE 1 TABLET BY MOUTH EVERY DAY   Cyanocobalamin (VITAMIN B-12 PO) Take 2,500 mcg by mouth daily.   denosumab (PROLIA) 60 MG/ML SOSY injection Inject 60 mg into the skin every 6 (six) months.   diclofenac Sodium (VOLTAREN) 1 % GEL Apply 2 g topically 4 (four) times daily.   diphenhydrAMINE (BENADRYL) 25 MG tablet Take 25 mg by mouth every 6 (six) hours as needed for allergies.   famotidine (PEPCID) 40 MG tablet Take 1 tablet (40 mg total) by mouth daily.   fluticasone (FLONASE) 50 MCG/ACT nasal spray SPRAY 2 SPRAYS INTO EACH NOSTRIL EVERY DAY   Omega-3 Fatty Acids (FISH OIL) 1200 MG CAPS Take 1,200 mg by mouth daily.   Probiotic Product (PROBIOTIC DAILY PO) Take 1 tablet by mouth daily.   SUMAtriptan (IMITREX) 100 MG tablet TAKE 1 TABLET BY MOUTH ONCE FOR 1 DOSE. MAY REPEAT IN 2 HOURS IF HEADACHE PERSISTS / RECURS (Patient taking differently: Take 100 mg by mouth every 2 (two) hours as needed for migraine.)   zolpidem (AMBIEN) 5 MG tablet Take 0.5-1 tablets (2.5-5 mg total) by mouth at bedtime as needed for sleep.   Current Facility-Administered Medications for the 05/06/21 encounter (Office Visit) with [name], MD  Medication   0.9 %  sodium chloride infusion     Allergies:   Boniva [ibandronic acid], Hydrocodone-acetaminophen, Lipitor [atorvastatin], and Tramadol   Social History   Tobacco Use   Smoking status: Former    Packs/day: 0.25    Years: 5.00    Pack years: 1.25    Types: Cigarettes    Quit date: 03/03/1967    Years since  quitting: 54.2   Smokeless tobacco: Never   Tobacco comments:    6 cig per day when she was smoking/quit years ago  Vaping Use   Vaping Use: Never used  Substance Use Topics   Alcohol use: Yes    Alcohol/week: 7.0 standard drinks    Types: 7 Cans of beer per week    Comment: 1 beer per day - 2.4 % alcholol - [employer] 55   Drug use: No     Family Hx: The patient's family history includes Breast cancer (age of onset: 50) in her sister; Cancer in her father;  Colon cancer (age of onset: 55) in her sister; Dementia in her mother; Epilepsy in her mother; Heart disease in her father; Hip fracture in her mother; Other in her brother and maternal grandfather. There is no history of Esophageal cancer or Stomach cancer.  ROS:   Please see the history of present illness.    none All other systems reviewed and are negative.   Prior CV studies:   The following studies were reviewed today:  none  Labs/Other Tests and Data Reviewed:    EKG:  No ECG reviewed.  Recent Labs: 11/20/2020: ALT 7; BUN 17; Creatinine 0.74; Hemoglobin 11.6; Platelet Count 250; Potassium 3.8; Sodium 138   Recent Lipid Panel Lab Results  Component Value Date/Time   CHOL 232 (H) 09/25/2020 10:04 AM   TRIG 86.0 09/25/2020 10:04 AM   HDL 64.50 09/25/2020 10:04 AM   CHOLHDL 4 09/25/2020 10:04 AM   LDLCALC 150 (H) 09/25/2020 10:04 AM   LDLDIRECT 178.9 03/23/2013 10:59 AM    Wt Readings from Last 3 Encounters:  05/06/21 124 lb 3.2 oz (56.3 kg)  04/23/21 129 lb 9.6 oz (58.8 kg)  03/04/21 125 lb (56.7 kg)     Objective:    Vital Signs:  BP 104/62    Pulse 68    Ht '5\' 6"'$  (1.676 m)    Wt 124 lb 3.2 oz (56.3 kg)    LMP 03/02/1994 (Approximate)    SpO2 97%    BMI 20.05 kg/m   GEN: Well nourished, well developed in no acute distress HEENT: Normal NECK: No JVD; No carotid bruits LYMPHATICS: No lymphadenopathy CARDIAC:RRR, no murmurs, rubs, gallops RESPIRATORY:  Clear to auscultation without rales, wheezing or  rhonchi  ABDOMEN: Soft, non-tender, non-distended MUSCULOSKELETAL:  No edema; No deformity  SKIN: Warm and dry NEUROLOGIC:  Alert and oriented x 3 PSYCHIATRIC:  Normal affect   ASSESSMENT & PLAN:    1.  OSA  -she has severe OSA intolerant to PAP therapy -she is now s/p Hypoglossal nerve stimulator implant by Dr. Redmond Baseman -her device was activated several months ago with the following programming:              -Sensation threshold 0.5V             -Functional threshold 0.6V -programmed with the following parameters:             -Pulse width 68m             -rate '33Hz'$              -Start delay 334m             -Pause time 1534m            -Therapy duration 8hr             -Electrodes +-+             -Stimulation settings 0.6-1.6V -she was doing well after 4 weeks of using her device -she had significant improvement in her daytime sleepiness and is not snoring -she underwent in lab titration 01/09/2021 with a therapeutic amplitude of 1.4V with an AHI of 7.5/hr and O2 sats as low as 88%.  Her device was programmed to amplitude of 1.3V and she is now up to 1.5 V using it and doing very well without any problems at all and no excessive daytime sleepiness.  She says she feels the best she has felt in a long time -Sleep lab data was reviewed and showed  an AHI 1.6/h at 1.3 V but jumped to 8/h at 1.5 V.  She feels good though at 1.5 V. -Her device was interrogated and she is 96% compliant using her device greater than 4 hours and average use of 7.9 hours. -Device changes made today were as follows: Her therapeutic range was changed to an amplitude of 1.3 to 1.8 V. -I am going to see her back in 6 months but she Brittany let me know in the interim if anything changes in how she feels.   2.  PAF -s/p ablation -she continues to maintain NSR on exam and denies any palpitations -continue prescription drug management with Apixaban '5mg'$  BID and Lopressor 12.'5mg'$  BID with PRN refills -I have personally  reviewed and interpreted outside labs performed by patient's PCP which showed SCr 0.74 and K+ 3.8 and Hbg 11.6 on 11/20/2020    Medication Adjustments/Labs and Tests Ordered: Current medicines are reviewed at length with the patient today.  Concerns regarding medicines are outlined above.  No orders of the defined types were placed in this encounter.  No orders of the defined types were placed in this encounter.   COVID-19 Education: The signs and symptoms of COVID-19 were discussed with the patient and how to seek care for testing (follow up with PCP or arrange E-visit).  The importance of social distancing was discussed today.  Medication Adjustments/Labs and Tests Ordered: Current medicines are reviewed at length with the patient today.  Concerns regarding medicines are outlined above.   Tests Ordered: No orders of the defined types were placed in this encounter.   Medication Changes: No orders of the defined types were placed in this encounter.   Follow Up:  After Inspire titration  Signed, Brittany Him, MD  05/06/2021 11:03 AM    [place]

## 2021-05-06 NOTE — Patient Instructions (Signed)
Medication Instructions:  Your physician recommends that you continue on your current medications as directed. Please refer to the Current Medication list given to you today.  *If you need a refill on your cardiac medications before your next appointment, please call your pharmacy*  Follow-Up: At [facility], you and your health needs are our priority.  As part of our continuing mission to provide you with exceptional heart care, we have created designated Provider Care Teams.  These Care Teams include your primary Cardiologist (physician) and Advanced Practice Providers (APPs -  Physician Assistants and Nurse Practitioners) who all work together to provide you with the care you need, when you need it.  Your next appointment:   6 month(s)  The format for your next appointment:   In Person  Provider:   Traci Turner, MD   

## 2021-05-07 ENCOUNTER — Other Ambulatory Visit: Payer: Self-pay | Admitting: Family Medicine

## 2021-05-07 DIAGNOSIS — F3342 Major depressive disorder, recurrent, in full remission: Secondary | ICD-10-CM

## 2021-05-14 DIAGNOSIS — Z87891 Personal history of nicotine dependence: Secondary | ICD-10-CM | POA: Diagnosis not present

## 2021-05-14 DIAGNOSIS — I700 Atherosclerosis of aorta: Secondary | ICD-10-CM | POA: Diagnosis not present

## 2021-05-14 DIAGNOSIS — F3342 Major depressive disorder, recurrent, in full remission: Secondary | ICD-10-CM | POA: Diagnosis not present

## 2021-05-19 ENCOUNTER — Other Ambulatory Visit: Payer: Self-pay | Admitting: *Deleted

## 2021-05-19 DIAGNOSIS — Z170 Estrogen receptor positive status [ER+]: Secondary | ICD-10-CM

## 2021-05-20 ENCOUNTER — Inpatient Hospital Stay: Payer: PPO | Attending: Hematology and Oncology

## 2021-05-20 ENCOUNTER — Other Ambulatory Visit: Payer: Self-pay

## 2021-05-20 ENCOUNTER — Inpatient Hospital Stay: Payer: PPO

## 2021-05-20 VITALS — BP 127/63 | HR 80 | Temp 98.4°F

## 2021-05-20 DIAGNOSIS — M810 Age-related osteoporosis without current pathological fracture: Secondary | ICD-10-CM

## 2021-05-20 DIAGNOSIS — Z170 Estrogen receptor positive status [ER+]: Secondary | ICD-10-CM | POA: Insufficient documentation

## 2021-05-20 DIAGNOSIS — C50811 Malignant neoplasm of overlapping sites of right female breast: Secondary | ICD-10-CM | POA: Diagnosis not present

## 2021-05-20 LAB — CMP (CANCER CENTER ONLY)
ALT: 8 U/L (ref 0–44)
AST: 16 U/L (ref 15–41)
Albumin: 4.1 g/dL (ref 3.5–5.0)
Alkaline Phosphatase: 50 U/L (ref 38–126)
Anion gap: 5 (ref 5–15)
BUN: 12 mg/dL (ref 8–23)
CO2: 31 mmol/L (ref 22–32)
Calcium: 9.6 mg/dL (ref 8.9–10.3)
Chloride: 102 mmol/L (ref 98–111)
Creatinine: 0.67 mg/dL (ref 0.44–1.00)
GFR, Estimated: >60 mL/min (ref >60)
Glucose, Bld: 90 mg/dL (ref 70–99)
Potassium: 3.7 mmol/L (ref 3.5–5.1)
Sodium: 138 mmol/L (ref 135–145)
Total Bilirubin: 0.4 mg/dL (ref 0.3–1.2)
Total Protein: 7 g/dL (ref 6.5–8.1)

## 2021-05-20 LAB — CBC WITH DIFFERENTIAL (CANCER CENTER ONLY)
Abs Immature Granulocytes: 0.01 10*3/uL (ref 0.00–0.07)
Basophils Absolute: 0 10*3/uL (ref 0.0–0.1)
Basophils Relative: 1 %
Eosinophils Absolute: 0.2 10*3/uL (ref 0.0–0.5)
Eosinophils Relative: 4 %
HCT: 38.1 % (ref 36.0–46.0)
Hemoglobin: 12.7 g/dL (ref 12.0–15.0)
Immature Granulocytes: 0 %
Lymphocytes Relative: 24 %
Lymphs Abs: 1.2 10*3/uL (ref 0.7–4.0)
MCH: 31.3 pg (ref 26.0–34.0)
MCHC: 33.3 g/dL (ref 30.0–36.0)
MCV: 93.8 fL (ref 80.0–100.0)
Monocytes Absolute: 0.4 10*3/uL (ref 0.1–1.0)
Monocytes Relative: 7 %
Neutro Abs: 3 10*3/uL (ref 1.7–7.7)
Neutrophils Relative %: 64 %
Platelet Count: 230 10*3/uL (ref 150–400)
RBC: 4.06 MIL/uL (ref 3.87–5.11)
RDW: 12.7 % (ref 11.5–15.5)
WBC Count: 4.8 10*3/uL (ref 4.0–10.5)
nRBC: 0 % (ref 0.0–0.2)

## 2021-05-20 MED ORDER — SODIUM CHLORIDE 0.9 % IV SOLN: Freq: Once | INTRAVENOUS | Status: DC

## 2021-05-20 MED ORDER — DENOSUMAB 60 MG/ML ~~LOC~~ SOSY
60.0000 mg | PREFILLED_SYRINGE | Freq: Once | SUBCUTANEOUS | Status: AC
  Administered 2021-05-20: 60 mg via SUBCUTANEOUS
  Filled 2021-05-20: qty 1

## 2021-05-23 ENCOUNTER — Telehealth: Payer: Self-pay | Admitting: Family Medicine

## 2021-05-23 NOTE — Telephone Encounter (Signed)
Patient called in requesting a refill for SUMAtriptan (IMITREX) 100 MG tablet [[id number]]  to be sent to her pharmacy. ? ?Please advise. ?

## 2021-05-23 NOTE — Telephone Encounter (Signed)
Upon review of chart, Sumatriptan last prescribed by different provider in 2020. Will send to Dr Ethlyn Gallery for approval of refill. ? ?Last OV: acute visit on 04/23/21, last cpe 02/10/21 ?

## 2021-05-25 ENCOUNTER — Other Ambulatory Visit: Payer: Self-pay | Admitting: Family Medicine

## 2021-05-25 MED ORDER — SUMATRIPTAN SUCCINATE 100 MG PO TABS: 100.0000 mg | ORAL_TABLET | Freq: Once | ORAL | 5 refills | Status: AC

## 2021-05-25 NOTE — Telephone Encounter (Signed)
Rx refilled.

## 2021-07-22 ENCOUNTER — Telehealth: Payer: Self-pay | Admitting: Pharmacist

## 2021-07-22 NOTE — Chronic Care Management (AMB) (Signed)
Chronic Care Management Pharmacy Assistant   Name: Brittany [name]  MRN: [id number] DOB: [birth year]  Reason for Encounter: Disease State / General Assessment Call   Conditions to be addressed/monitored: Atrial Fibrillation, Anxiety, Depression, GERD, and Osteoporosis  Recent office visits:  04/23/2021 Brittany Rough MD - Patient was seen for acute cough and an additional issue. No medication changes. Return if symptoms worsen or fail to improve.  Recent consult visits:  05/14/2021 Brittany [name] - Patient was seen for Major depressive disorder, recurrent, in full remission and additional issues. No additional chart notes.   05/06/2021 Brittany Him MD (cardiology) - Patient was seen for OSA (obstructive sleep apnea and an additional issue. Discontinued Bupropion. Follow up in 6 months.   04/10/2021 Brittany Shown MD (orthopeadic surgery) - Patient was seen for History of total knee replacement, left. Started Amoxicillin 2000 mg. Follow up in 6 months.   03/26/2021 Brittany [name] - Patient was seen for Paroxysmal atrial fibrillation and additional issues. No additional chart notes.   Hospital visits:  None  Medications: Outpatient Encounter Medications as of 07/22/2021  Medication Sig   acetaminophen (TYLENOL) 500 MG tablet Take 500 mg by mouth every 6 (six) hours as needed.   Biotin 5000 MCG TABS Take 5,000 mcg by mouth daily.   Calcium-Magnesium-Zinc (CAL-MAG-ZINC PO) Take 1 tablet by mouth daily.   Cholecalciferol (VITAMIN D) 50 MCG (2000 UT) tablet Take 2,000 Units by mouth daily.   citalopram (CELEXA) 40 MG tablet TAKE 1 TABLET BY MOUTH EVERY DAY   Cyanocobalamin (VITAMIN B-12 PO) Take 2,500 mcg by mouth daily.   denosumab (PROLIA) 60 MG/ML SOSY injection Inject 60 mg into the skin every 6 (six) months.   diclofenac Sodium (VOLTAREN) 1 % GEL Apply 2 g topically 4 (four) times daily.   diphenhydrAMINE (BENADRYL) 25 MG tablet Take 25 mg by mouth every 6 (six) hours as needed for  allergies.   famotidine (PEPCID) 40 MG tablet Take 1 tablet (40 mg total) by mouth daily.   fluticasone (FLONASE) 50 MCG/ACT nasal spray SPRAY 2 SPRAYS INTO EACH NOSTRIL EVERY DAY   Omega-3 Fatty Acids (FISH OIL) 1200 MG CAPS Take 1,200 mg by mouth daily.   Probiotic Product (PROBIOTIC DAILY PO) Take 1 tablet by mouth daily.   SUMAtriptan (IMITREX) 100 MG tablet Take 1 tablet (100 mg total) by mouth once for 1 dose. Can repeat dose in 2 hours if symptoms persist. Max dose 2 tablets in 24 hours.   zolpidem (AMBIEN) 5 MG tablet Take 0.5-1 tablets (2.5-5 mg total) by mouth at bedtime as needed for sleep.   Facility-Administered Encounter Medications as of 07/22/2021  Medication   0.9 %  sodium chloride infusion  Fill History: CITALOPRAM HBR 40 MG TABLET 07/06/2021 90   FAMOTIDINE 40 MG TABLET 06/19/2021 90   FLUTICASONE PROP 50 MCG SPRAY 07/17/2021 90   SUMATRIPTAN SUCCINATE 100 MG TABLET 07/17/2021 30   ZOLPIDEM TARTRATE 5 MG TABLET 06/25/2021 90   Contacted [name] for General Review Call  Adherence Review:  Does the Clinical Pharmacist Assistant have access to adherence rates? Yes  Adherence rates for STAR metric medications (List medication(s)/day supply/ last 2 fill dates).  Adherence rates for medications indicated for disease state being reviewed (List medication(s)/day supply/ last 2 fill dates).  Does the patient have >5 day gap between last estimated fill dates for any of the above medications or other medication gaps? No  Disease State Questions:  Able to connect with Patient?  Yes  Did patient have any problems with their health recently? Patient denies any recent health issues  Have you had any admissions or emergency room visits or worsening of your condition(s) since last visit? Patient denies any worsening issues and no ED or hospital visits.   Have you had any visits with new specialists or providers since your last visit? Yes, Naiping Xu for a total left  knee replacement   Have you had any new health care problem(s) since your last visit? Patient denies any new health problems.  Have you run out of any of your medications since you last spoke with clinical pharmacist? Patient denies running out of any medications.   Are there any medications you are not taking as prescribed? Patient denies  Are you having any issues or side effects with your medications? Patient denies any issues with medications.   Do you have any other health concerns or questions you want to discuss with your Clinical Pharmacist before your next visit? Patient denies any concerns currently  Are there any health concerns that you feel we can do a better job addressing? Patient denies.      Care Gaps: AWV - scheduled - 03/09/2021 Last BP - 104/60 on 05/06/2021  Star Rating Drugs: None  [place]  Clinical Pharmacist Assistant [phone]

## 2021-08-14 ENCOUNTER — Encounter: Payer: Self-pay | Admitting: Podiatry

## 2021-08-14 ENCOUNTER — Ambulatory Visit (INDEPENDENT_AMBULATORY_CARE_PROVIDER_SITE_OTHER): Payer: PPO

## 2021-08-14 ENCOUNTER — Ambulatory Visit: Payer: PPO | Admitting: Podiatry

## 2021-08-14 DIAGNOSIS — M21611 Bunion of right foot: Secondary | ICD-10-CM

## 2021-08-14 DIAGNOSIS — D169 Benign neoplasm of bone and articular cartilage, unspecified: Secondary | ICD-10-CM

## 2021-08-14 DIAGNOSIS — M2041 Other hammer toe(s) (acquired), right foot: Secondary | ICD-10-CM | POA: Diagnosis not present

## 2021-08-14 DIAGNOSIS — M21619 Bunion of unspecified foot: Secondary | ICD-10-CM

## 2021-08-14 NOTE — Progress Notes (Signed)
Subjective:   Patient ID: Brittany [name], female   DOB: 76 y.o.   MRN: [id number]   HPI Patient states her bunion is gradually getting worse and the pain in between her toes is bothering her more and she would like to have this fixed.  States she is having increased trouble trying to find shoes that are comfortable she is tried cushioning different pad systems and shoe gear along with trending without relief of symptoms   ROS      Objective:  Physical Exam  Neurovascular status intact good digital perfusion noted patient noted to have structural bunion deformity right with redness around the first metatarsal head deviation of the big toe against the second toe with keratotic lesion between the big toe and the second toe that becomes painful with pressure.  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  I do think distal osteotomy along with possible A[place] for removal of bone spur and arthroplasty digit to right would be best for her long-term and I did explain this to her and recommended this kind of treatment pattern.  I did explain we will probably not get full correction of distal osteotomy but I think she will have less recovery risk than trying to do fusion procedure.  She is amendable to this does understand she will not get complete correction and wants surgery and I allowed her to read then signed consent form after extensive review.  I did go over with her what would be required and recovery that can take upwards of 6 months and I did dispense air fracture walker today fitting it properly to her lower leg and gave her instructions for usage of this along with finding a shoe on the other foot that fits well with that  so she will be able to walk.  All questions answered patient saw the scheduler and is scheduled for outpatient surgery with all questions answered  X-rays right indicate elevation of the 1 2 intermetatarsal angle of approximate 15 degrees rotation of the big toe against the second toe right with irritation of the bone structure second digit right foot and bone spur right big toe

## 2021-08-21 ENCOUNTER — Telehealth: Payer: Self-pay | Admitting: Urology

## 2021-08-21 NOTE — Telephone Encounter (Addendum)
DOS - 09/09/21  DOUBLE OSTEOTOMY RIGHT --- 37793 HAMMERTOE REPAIR 2ND RIGHT --- 96886 EXOSTECTOMY 1ST RIGHT --- 28108  [facility] EFFECTIVE DATE - 03/02/21   RECEIVED A FAX FROM [facility] STATING THAT CPT CODES 48472, 07218 AND 28833 HAS BEEN APPROVED, [place] # [id number], GOOD FROM 09/16/21 - 12/15/21.

## 2021-09-15 ENCOUNTER — Encounter: Payer: PPO | Admitting: Podiatry

## 2021-09-15 MED ORDER — ONDANSETRON HCL 4 MG PO TABS: 4.0000 mg | ORAL_TABLET | Freq: Three times a day (TID) | ORAL | 0 refills | Status: DC | PRN

## 2021-09-15 MED ORDER — OXYCODONE-ACETAMINOPHEN 10-325 MG PO TABS: 1.0000 | ORAL_TABLET | Freq: Four times a day (QID) | ORAL | 0 refills | Status: DC | PRN

## 2021-09-15 NOTE — Addendum Note (Signed)
Addended by: Wallene Huh on: 09/15/2021 09:05 AM   Modules accepted: Orders

## 2021-09-16 ENCOUNTER — Encounter: Payer: Self-pay | Admitting: Podiatry

## 2021-09-16 DIAGNOSIS — M25774 Osteophyte, right foot: Secondary | ICD-10-CM

## 2021-09-16 DIAGNOSIS — M21611 Bunion of right foot: Secondary | ICD-10-CM | POA: Diagnosis not present

## 2021-09-16 DIAGNOSIS — M2011 Hallux valgus (acquired), right foot: Secondary | ICD-10-CM

## 2021-09-16 DIAGNOSIS — M2041 Other hammer toe(s) (acquired), right foot: Secondary | ICD-10-CM

## 2021-09-19 ENCOUNTER — Other Ambulatory Visit: Payer: Self-pay | Admitting: *Deleted

## 2021-09-19 MED ORDER — FAMOTIDINE 40 MG PO TABS: 40.0000 mg | ORAL_TABLET | Freq: Every day | ORAL | 0 refills | Status: DC

## 2021-09-22 ENCOUNTER — Ambulatory Visit (INDEPENDENT_AMBULATORY_CARE_PROVIDER_SITE_OTHER): Payer: PPO | Admitting: Podiatry

## 2021-09-22 ENCOUNTER — Ambulatory Visit (INDEPENDENT_AMBULATORY_CARE_PROVIDER_SITE_OTHER): Payer: PPO

## 2021-09-22 ENCOUNTER — Encounter: Payer: Self-pay | Admitting: Podiatry

## 2021-09-22 DIAGNOSIS — D169 Benign neoplasm of bone and articular cartilage, unspecified: Secondary | ICD-10-CM

## 2021-09-22 NOTE — Progress Notes (Signed)
Subjective:   Patient ID: Brittany [name], female   DOB: 76 y.o.   MRN: [id number]   HPI Patient states doing well with surgery states that is having minimal discomfort   ROS      Objective:  Physical Exam  Neurovascular status intact negative Bevelyn Buckles' sign noted wound edges are coapted well the hallux is slightly straighter than I would like to see it but there is a definite area of space between the hallux and second toe with incision site hallux second toe healing well     Assessment:  Possibility for mild soft tissue varus deformity right despite the fact there was severe initial deformity     Plan:  H&P reviewed condition and we will get a hold the toe over and over valgus position over correcting at and hopefully this will reduce over the next.  Of time.  We will leave stitches in 2 weeks and sterile dressing applied today with valgus positioning boot surgical shoe to be used reappoint 2 weeks or earlier if anything were to occur or any issues  Mild varus deformity of the right first hallux with good healing of the osteotomy site good position second toe

## 2021-09-29 ENCOUNTER — Telehealth: Payer: Self-pay | Admitting: Pharmacist

## 2021-09-29 NOTE — Chronic Care Management (AMB) (Signed)
    Chronic Care Management Pharmacy Assistant   Name: TENECIA IGNASIAK  MRN: [id number] DOB: 1945-10-25  09/30/2021 APPOINTMENT REMINDER  Beckey Rutter Kundert was reminded to have all medications, supplements and any blood glucose and blood pressure readings available for review with Jeni Salles, Pharm. D, at her telephone visit on 09/30/2021 at 2:00.  Care Gaps: AWV - scheduled 03/09/2022 Last BP - 104/62 on 05/06/2021  Star Rating Drug: None  Any gaps in medications fill history? No  [facility]  Catering manager [phone]

## 2021-09-30 ENCOUNTER — Ambulatory Visit: Payer: PPO | Admitting: Pharmacist

## 2021-09-30 DIAGNOSIS — I480 Paroxysmal atrial fibrillation: Secondary | ICD-10-CM

## 2021-09-30 DIAGNOSIS — F411 Generalized anxiety disorder: Secondary | ICD-10-CM

## 2021-09-30 NOTE — Progress Notes (Signed)
Chronic Care Management Pharmacy Note  10/06/2021 Name:  Brittany [name] MRN:  [id number] DOB:  Jul 19, 1945  Summary: Pt has decreased to 1/2 tablet of Ambien every night Pt is due for CPE  Recommendations/Changes made from today's visit: -Counseled on sleep hygiene and trial of using Ambien for PRN use -Recommend repeat DEXA scan and vitamin D level  Plan: Scheduled CPE Follow up in 1 year  Subjective: Brittany [name] is an 76 y.o. year old female who is a primary patient of [name], [name], MD (Inactive).  The CCM team was consulted for assistance with disease management and care coordination needs.    Engaged with patient by telephone for follow up visit in response to provider referral for pharmacy case management and/or care coordination services.   Consent to Services:  The patient was given information about Chronic Care Management services, agreed to services, and gave verbal consent prior to initiation of services.  Please see initial visit note for detailed documentation.   Patient Care Team: [name], MD (Inactive) as PCP - General (Family Medicine) [name], MD as PCP - Cardiology (Cardiology) [name], MD as PCP - Electrophysiology (Cardiology) [name], MD as PCP - Sleep Medicine (Cardiology) [name], [name], MD (Inactive) as Consulting Physician (Hematology and Oncology) [name], MD as Consulting Physician (Sleep Medicine) [name], [facility] as Pharmacist (Pharmacist) [name], MD as Attending Physician (Orthopedic Surgery)  Recent office visits: 04/23/2021 [name] MD - Patient was seen for acute cough and an additional issue. No medication changes. Return if symptoms worsen or fail to improve.   Recent consult visits: 09/22/21 Ila Mcgill, DPM (podiatry): Patient presented for routine post op visit. Follow up in 2 weeks.  08/14/21 Ila Mcgill, DPM (podiatry): Patient presented for bunions.  Prescribed Zofran and oxycodone PRN.  05/20/21 Cancer center: Patient presented for Prolia injection.  05/14/21 [name] - Patient was seen for Major depressive disorder, recurrent, in full remission and additional issues. No additional chart notes.    05/06/21 [name] MD (cardiology) - Patient was seen for OSA (obstructive sleep apnea and an additional issue. Discontinued Bupropion. Follow up in 6 months.    04/10/21 [name] MD (orthopeadic surgery) - Patient was seen for History of total knee replacement, left. Started Amoxicillin 2000 mg. Follow up in 6 months.   Hospital visits: None in previous 6 months   Objective:  Lab Results  Component Value Date   CREATININE 0.67 05/20/2021   BUN 12 05/20/2021   GFR 88.91 09/25/2020   GFRNONAA >60 05/20/2021   GFRAA >60 09/19/2019   NA 138 05/20/2021   K 3.7 05/20/2021   CALCIUM 9.6 05/20/2021   CO2 31 05/20/2021   GLUCOSE 90 05/20/2021    Lab Results  Component Value Date/Time   GFR 88.91 09/25/2020 10:04 AM   GFR 79.65 07/24/2019 09:46 AM    Last diabetic Eye exam: No results found for: "HMDIABEYEEXA"  Last diabetic Foot exam: No results found for: "HMDIABFOOTEX"   Lab Results  Component Value Date   CHOL 232 (H) 09/25/2020   HDL 64.50 09/25/2020   LDLCALC 150 (H) 09/25/2020   LDLDIRECT 178.9 03/23/2013   TRIG 86.0 09/25/2020   CHOLHDL 4 09/25/2020       Latest Ref Rng & Units 05/20/2021   12:09 PM 11/20/2020    1:18 PM 09/25/2020   10:04 AM  Hepatic Function  Total Protein 6.5 - 8.1 g/dL 7.0  6.8  7.0   Albumin 3.5 - 5.0 g/dL 4.1  3.8  4.3   AST 15 - 41 U/L '16  12  15   ' ALT 0 - 44 U/L '8  7  11   ' Alk Phosphatase 38 - 126 U/L 50  72  55   Total Bilirubin 0.3 - 1.2 mg/dL 0.4  0.3  0.6     Lab Results  Component Value Date/Time   TSH 1.44 07/24/2019 09:46 AM   TSH 1.37 10/13/2017 11:47 AM       Latest Ref Rng & Units 05/20/2021   12:09 PM 11/20/2020    1:18 PM 10/12/2020    3:22 AM  CBC  WBC 4.0 -  10.5 K/uL 4.8  5.9  8.8   Hemoglobin 12.0 - 15.0 g/dL 12.7  11.6  11.0   Hematocrit 36.0 - 46.0 % 38.1  34.0  33.2   Platelets 150 - 400 K/uL 230  250  208     Lab Results  Component Value Date/Time   VD25OH 58.83 07/24/2019 09:46 AM   VD25OH 67 08/21/2013 03:25 PM   VD25OH 67 09/12/2012 11:17 AM    Clinical ASCVD: No  The 10-year ASCVD risk score (Arnett DK, et al., 2019) is: 14.5%   Values used to calculate the score:     Age: 34 years     Sex: Female     Is Non-Hispanic African American: No     Diabetic: No     Tobacco smoker: No     Systolic Blood Pressure: 943 mmHg     Is BP treated: No     HDL Cholesterol: 64.5 mg/dL     Total Cholesterol: 232 mg/dL       04/23/2021    2:37 PM 03/04/2021    1:32 PM 02/10/2021   11:01 AM  Depression screen PHQ 2/9  Decreased Interest 0 0 0  Down, Depressed, Hopeless 1 0   PHQ - 2 Score 1 0 0  Altered sleeping 0  0  Tired, decreased energy 0  0  Change in appetite 0  0  Feeling bad or failure about yourself  0  2  Trouble concentrating 0  0  Moving slowly or fidgety/restless 0  0  Suicidal thoughts 0  0  PHQ-9 Score 1  2     CHA2DS2/VAS Stroke Risk Points  Current as of yesterday     2 >= 2 Points: High Risk  1 - 1.99 Points: Medium Risk  0 Points: Low Risk    Last Change: N/A      Details    This score determines the patient's risk of having a stroke if the  patient has atrial fibrillation.       Points Metrics  0 Has Congestive Heart Failure:  No    Current as of yesterday  0 Has Vascular Disease:  No    Current as of yesterday  0 Has Hypertension:  No    Current as of yesterday  1 Age:  4    Current as of yesterday  0 Has Diabetes:  No    Current as of yesterday  0 Had Stroke:  No  Had TIA:  No  Had Thromboembolism:  No    Current as of yesterday  1 Female:  Yes    Current as of yesterday     Social History   Tobacco Use  Smoking Status Former   Packs/day: 0.25   Years: 5.00  Total pack years: 1.25    Types: Cigarettes   Quit date: 03/03/1967   Years since quitting: 54.6  Smokeless Tobacco Never  Tobacco Comments   6 cig per day when she was smoking/quit years ago   BP Readings from Last 3 Encounters:  05/20/21 127/63  05/06/21 104/62  04/23/21 (!) 102/50   Pulse Readings from Last 3 Encounters:  05/20/21 80  05/06/21 68  04/23/21 81   Wt Readings from Last 3 Encounters:  05/06/21 124 lb 3.2 oz (56.3 kg)  04/23/21 129 lb 9.6 oz (58.8 kg)  03/04/21 125 lb (56.7 kg)   BMI Readings from Last 3 Encounters:  05/06/21 20.05 kg/m  04/23/21 20.92 kg/m  03/04/21 20.18 kg/m    Assessment/Interventions: Review of patient past medical history, allergies, medications, health status, including review of consultants reports, laboratory and other test data, was performed as part of comprehensive evaluation and provision of chronic care management services.   SDOH:  (Social Determinants of Health) assessments and interventions performed: Yes (last 03/14/21)   SDOH Screenings   Alcohol Screen: Low Risk  (04/19/2021)   Alcohol Screen    Last Alcohol Screening Score (AUDIT): 3  Depression (PHQ2-9): Low Risk  (04/23/2021)   Depression (PHQ2-9)    PHQ-2 Score: 1  Financial Resource Strain: Low Risk  (04/19/2021)   Overall Financial Resource Strain (CARDIA)    Difficulty of Paying Living Expenses: Not hard at all  Food Insecurity: No Food Insecurity (04/19/2021)   Hunger Vital Sign    Worried About Running Out of Food in the Last Year: Never true    Ran Out of Food in the Last Year: Never true  Housing: Low Risk  (04/19/2021)   Housing    Last Housing Risk Score: 0  Physical Activity: Sufficiently Active (04/19/2021)   Exercise Vital Sign    Days of Exercise per Week: 3 days    Minutes of Exercise per Session: 60 min  Social Connections: Socially Integrated (04/19/2021)   Social Connection and Isolation Panel [NHANES]    Frequency of Communication with Friends and Family: More than  three times a week    Frequency of Social Gatherings with Friends and Family: Three times a week    Attends Religious Services: More than 4 times per year    Active Member of Clubs or Organizations: Yes    Attends Archivist Meetings: More than 4 times per year    Marital Status: Married  Stress: No Stress Concern Present (04/19/2021)   [place]    Feeling of Stress : Not at all  Tobacco Use: Medium Risk (09/22/2021)   Patient History    Smoking Tobacco Use: Former    Smokeless Tobacco Use: Never    Passive Exposure: Not on file  Transportation Needs: No Transportation Needs (04/19/2021)   PRAPARE - Hydrologist (Medical): No    Lack of Transportation (Non-Medical): No       CCM Care Plan  Allergies  Allergen Reactions   Boniva [Ibandronic Acid] Nausea Only   Hydrocodone-Acetaminophen Other (See Comments)    "Does not relieve pain", patient reported.  Can take Tylenol.   Lipitor [Atorvastatin] Other (See Comments)    Muscle and joint pain(s)   Pt request removal of this intolerance   Tramadol Other (See Comments)    "Does not help pain."- patient reported.    Medications Reviewed Today     Reviewed by Kipp Brood,  Mariam Dollar, [facility] (Pharmacist) on 09/30/21 at 1415  Med List Status: <None>   Medication Order Taking? Sig Documenting Provider Last Dose Status Informant  0.9 %  sodium chloride infusion [id number]   Gatha Mayer, MD  Active   acetaminophen (TYLENOL) 500 MG tablet [id number] Yes Take 500 mg by mouth every 6 (six) hours as needed. [provider] Taking Active   Biotin 5000 MCG TABS [id number]  Take 5,000 mcg by mouth daily. [provider]  Active Self  Calcium-Magnesium-Zinc (CAL-MAG-ZINC PO) [id number]  Take 1 tablet by mouth daily. 600 mg [provider]  Active Self  Cholecalciferol (VITAMIN D) 50 MCG (2000 UT) tablet [id number]  Take 2,000  Units by mouth daily. [provider]  Active Self  citalopram (CELEXA) 40 MG tablet [id number]  TAKE 1 TABLET BY MOUTH EVERY DAY [name], Junell C, MD  Active   Cyanocobalamin (VITAMIN B-12 PO) [id number]  Take 2,500 mcg by mouth daily. [provider]  Active Self  denosumab (PROLIA) 60 MG/ML SOSY injection [id number]  Inject 60 mg into the skin every 6 (six) months. [provider]  Active Self  diclofenac Sodium (VOLTAREN) 1 % GEL [id number] Yes Apply 2 g topically 4 (four) times daily. [provider] Taking Active   diphenhydrAMINE (BENADRYL) 25 MG tablet [id number]  Take 25 mg by mouth every 6 (six) hours as needed for allergies. [provider]  Active Self  famotidine (PEPCID) 40 MG tablet [id number]  Take 1 tablet (40 mg total) by mouth daily. Dutch Quint B, FNP  Active   fluticasone (FLONASE) 50 MCG/ACT nasal spray [id number]  SPRAY 2 SPRAYS INTO EACH NOSTRIL EVERY DAY [name], Junell C, MD  Active   Omega-3 Fatty Acids (FISH OIL) 1200 MG CAPS [id number]  Take 1,200 mg by mouth daily. [provider]  Active Self  Probiotic Product (PROBIOTIC DAILY PO) [id number]  Take 1 tablet by mouth daily. [provider]  Active Self  SUMAtriptan (IMITREX) 100 MG tablet [id number]  Take 1 tablet (100 mg total) by mouth once for 1 dose. Can repeat dose in 2 hours if symptoms persist. Max dose 2 tablets in 24 hours. [name], MD  Expired 05/25/21 2359   zolpidem (AMBIEN) 5 MG tablet [id number]  Take 0.5-1 tablets (2.5-5 mg total) by mouth at bedtime as needed for sleep. [name], MD  Active             Patient Active Problem List   Diagnosis Date Noted   OSA (obstructive sleep apnea) 01/09/2021   Status post total left knee replacement 10/11/2020   Primary osteoarthritis of left knee 10/02/2020   Atrial fibrillation ([place]) 05/13/2018   Bloating 04/11/2018   Abnormal EKG 08/23/2017   Malignant neoplasm of overlapping  sites of right breast in female, estrogen receptor positive ([place]) 08/17/2016   Hx of colonic polyps 08/11/2016   Iron deficiency anemia 07/28/2016   Blood donor 07/28/2016   Pain due to total right knee replacement ([place]) 04/06/2016   S/P right TKA 12/31/2015   Baker's cyst, unruptured 09/27/2015   Insomnia 07/31/2015   Onychomycosis 09/25/2014   Urinary frequency 05/21/2014   Mitral valve prolapse 05/08/2014   Mitral regurgitation 05/08/2014   Dyslipidemia 02/09/2014   History of radiation therapy    Hiatal hernia    History of chemotherapy    Osteoporosis 03/21/2012   Seasonal and perennial allergic rhinitis 12/17/2011   B12 deficiency 10/09/2009   TOBACCO USE, QUIT 01/31/2009  THYROID NODULE 12/25/2008   GERD 11/23/2007   VARICOSE VEIN 12/29/2006   Anxiety state 12/23/2006   Depression 12/23/2006   Migraine variant 12/23/2006   Disorder of bone and cartilage 12/23/2006    Immunization History  Administered Date(s) Administered   Fluad Quad(high Dose 65+) 11/06/2020   Influenza Split 12/01/2011   Influenza Whole 12/06/2007, 11/15/2008, 01/06/2010, 11/11/2010   Influenza, High Dose Seasonal PF 12/20/2012, 11/20/2014, 11/21/2017, 11/07/2019   Influenza,inj,Quad PF,6+ Mos 11/30/2013   Influenza,inj,quad, With Preservative 11/02/2016   Influenza-Unspecified 11/27/2015, 10/31/2016, 10/27/2018   Moderna Covid-19 Vaccine Bivalent Booster 97yr & up 02/15/2021   Moderna Sars-Covid-2 Vaccination 03/30/2019, 04/27/2019, 01/08/2020, 09/30/2020   PNEUMOCOCCAL CONJUGATE-20 02/10/2021   Pneumococcal Conjugate-13 03/22/2013   Pneumococcal Polysaccharide-23 01/31/2009, 07/31/2015, 06/20/2019   Rabies, IM 03/04/2015, 03/07/2015, 03/11/2015, 03/18/2015   Td 10/09/2009   Tdap 03/04/2015   Zoster Recombinat (Shingrix) 02/13/2017, 07/03/2017   Zoster, Live 04/18/2009   Patient had foot surgery and had some pain with it. She inquired about taking other medications for pain other than  Tylenol. She reports the Tylenol does help and helps better than the oxycodone or hydrocodone. Patient does report that the Voltaren gel does help some with pain and helps with arthritis.  Patient reports she has been sleeping great lately. She is only taking 2.5 mg of the Ambien per night and is proud of that. Patient has not tried without it in a long time and isn't sure if it's just her taking something, even if it may not be doing anything but gives her peace of mind.  Patient reports she does plan to go back to exercising regularly soon.  Conditions to be addressed/monitored:  Hyperlipidemia, Atrial Fibrillation, GERD, Depression, Anxiety, and Osteoporosis  Conditions addressed this visit: Hyperlipidemia,   Care Plan : [place] Updates made by PViona Gilmore RBridgetonsince 10/06/2021 12:00 AM     Problem: Problem: Hyperlipidemia, Atrial Fibrillation, GERD, Depression, Anxiety, and Osteoporosis      Long-Range Goal: Patient-Specific Goal   Start Date: 03/14/2021  Expected End Date: 03/14/2022  Recent Progress: On track  Priority: High  Note:   Current Barriers:  Unable to independently monitor therapeutic efficacy Unable to maintain control of cholesterol  Pharmacist Clinical Goal(s):  Patient will achieve adherence to monitoring guidelines and medication adherence to achieve therapeutic efficacy maintain control of cholesterol as evidenced by next lipid panel  through collaboration with PharmD and provider.   Interventions: 1:1 collaboration with KCaren Macadam MD regarding development and update of comprehensive plan of care as evidenced by provider attestation and co-signature Inter-disciplinary care team collaboration (see longitudinal plan of care) Comprehensive medication review performed; medication list updated in electronic medical record  Hyperlipidemia: (LDL goal < 100) -Uncontrolled -Current treatment: No medications -Medications previously  tried: atorvastatin (myalgias)  -Current dietary patterns: limits fried foods -Current exercise habits: walking daily -Educated on Cholesterol goals;  Benefits of statin for ASCVD risk reduction; Importance of limiting foods high in cholesterol; -Counseled on diet and exercise extensively  Atrial Fibrillation (Goal: prevent stroke and major bleeding) -Controlled -CHADSVASC: 2 -Current treatment: Rate control: none Anticoagulation: none -Medications previously tried: Eliquis & metoprolol (low risk; stopped by patient) -Home BP and HR readings: uses apple watch to monitor her HR  -Counseled on Afib action plan reviewed -Recommended monitoring heart rate and considering metoprolol if HR is > 100.  Depression/Anxiety (Goal: minimize symptoms) -Controlled -Current treatment: Citalopram 40 mg 1 tablet daily - Appropriate, Effective, Safe, Accessible -Medications  previously tried/failed: Bupropion (no longer needed) -PHQ9: 0 -GAD7: n/a -Educated on Benefits of medication for symptom control Benefits of cognitive-behavioral therapy with or without medication -Counseled on diet and exercise extensively Recommended HIIT exercises for anxiety.  Insomnia (Goal: improve quality and quantity of sleep) -Controlled -Current treatment  Zolpidem 5 mg 1/2 to 1 tablet at bedtime as needed (only uses 1/2 tablet) - Appropriate, Effective, Safe, Accessible -Medications previously tried: Valerian root, melatonin (didn't help)  -Counseled on risks vs benefits with Ambien and consider going to as needed use.  Osteoporosis (Goal prevent fractures) -Controlled -Last DEXA Scan: 05/05/19   T-Score femoral neck: -2.3  T-Score total hip: -2.3  T-Score lumbar spine: -1.2  T-Score forearm radius: n/a  10-year probability of major osteoporotic fracture: n/a  10-year probability of hip fracture: n/a -Patient is a candidate for pharmacologic treatment due to T-Score < -2.5 in femoral neck and T-Score < -2.5  in total hip  -Current treatment  Prolia inject 60 mg every 6 months (last 11/20/20) - Appropriate, Effective, Safe, Accessible Vitamin D 1000 units daily -  not taking Calcium-mag-zinc vitamin 1 tablet daily (600 mg of calcium, 20 mcg of vitamin D) - Appropriate, Query effective, Safe, Accessible -Medications previously tried: Boniva (nausea)  -Recommend [phone] units of vitamin D daily. Recommend 1200 mg of calcium daily from dietary and supplemental sources. Recommend weight-bearing and muscle strengthening exercises for building and maintaining bone density. -Counseled on diet and exercise extensively Recommended to continue current medication Recommended repeat DEXA and vitamin D level  GERD (Goal: minimize symptoms) -Controlled -Current treatment  Famotidine 40 mg 1 tablet daily  - Appropriate, Effective, Safe, Accessible -Medications previously tried: Zantac (removed from market) -Recommended to continue current medication Patient is on due to difficulty swallowing.  Allergic rhinitis (Goal: minimize symptoms) -Controlled -Current treatment  Fluticasone 50 mcg/act 2 sprays in each nostril once daily  - Appropriate, Effective, Safe, Accessible  Diphenhydramine 25 mg as needed  - Appropriate, Effective, Query Safe, Accessible -Medications previously tried: Claritin, Zyrtec (ineffective)  -Counseled on sedation and fall risk with Benadryl and recommended using sparingly.    Health Maintenance -Vaccine gaps: none -Current therapy:  Sumatriptan 100 mg 1 tablet as needed Biotin 5000 mcg 1 tablet daily Probiotic daily Vitamin B12 2500 mcg 1 tablet daily -Educated on Cost vs benefit of each product must be carefully weighed by individual consumer -Patient is satisfied with current therapy and denies issues -Recommended to continue current medication  Patient Goals/Self-Care Activities Patient will:  - take medications as prescribed as evidenced by patient report and record  review target a minimum of 150 minutes of moderate intensity exercise weekly  Follow Up Plan: Telephone follow up appointment with care management team member scheduled for:1 year      Medication Assistance: None required.  Patient affirms current coverage meets needs.  Compliance/Adherence/Medication fill history: Care Gaps: Last BP - 104/62 on 05/06/2021  Star-Rating Drugs: None  Patient's preferred pharmacy is:  [facility], [place] [address] [place]  [address] Phone: [phone] Fax: [phone]  Uses pill box? No - has them in the drawer and lined up Pt endorses 100% compliance  We discussed: Current pharmacy is preferred with insurance plan and patient is satisfied with pharmacy services Patient decided to: Continue current medication management strategy  Care Plan and Follow Up Patient Decision:  Patient agrees to Care Plan and Follow-up.  Plan: Telephone follow up appointment with care management team member  scheduled for:  1 year  Jeni Salles, PharmD, [place] Pharmacist [place] [facility] [place]

## 2021-10-06 ENCOUNTER — Ambulatory Visit (INDEPENDENT_AMBULATORY_CARE_PROVIDER_SITE_OTHER): Payer: PPO | Admitting: Podiatry

## 2021-10-06 ENCOUNTER — Encounter: Payer: Self-pay | Admitting: Podiatry

## 2021-10-06 ENCOUNTER — Ambulatory Visit (INDEPENDENT_AMBULATORY_CARE_PROVIDER_SITE_OTHER): Payer: PPO

## 2021-10-06 DIAGNOSIS — Z9889 Other specified postprocedural states: Secondary | ICD-10-CM | POA: Diagnosis not present

## 2021-10-06 NOTE — Progress Notes (Signed)
Subjective:   Patient ID: Brittany [name], female   DOB: 76 y.o.   MRN: [id number]   HPI Patient presents stating I am doing very well very pleased so far   ROS      Objective:  Physical Exam  Neurovascular status intact negative Bevelyn Buckles' sign noted wound edges coapted well patient is holding her big toe over in a valgus position and it seems to be in a better position than 2 weeks ago and no pain noted      Assessment:  Overall doing well with a mild varus deformity postoperatively that were working on     Plan:  H&P x-ray reviewed continue bandaging the toe and over valgus position begin range of motion exercises and gradually return to soft shoe gear over the next couple weeks.  Patient will be seen back to recheck 4 weeks with compression stocking dispensed today and if the toe should go if anything abnormal position she is to come in immediately and we will reevaluate this again in 1 month.  I do feel pretty good it will be in good position but I will have to keep an eye on it  X-rays indicate mild varus deformity right big toe it is slightly better than it was from 2 weeks ago

## 2021-10-06 NOTE — Patient Instructions (Signed)
Hi Stayce,  It was great to speak with you again! I am glad things are going so well with you.  Please reach out to me if you have any questions or need anything before our follow up!  Best, Maddie  Jeni Salles, PharmD, [facility]   Visit Information   Goals Addressed   None    Patient Care Plan: CCM Pharmacy Care Plan     Problem Identified: Problem: Hyperlipidemia, Atrial Fibrillation, GERD, Depression, Anxiety, and Osteoporosis      Long-Range Goal: Patient-Specific Goal   Start Date: 03/14/2021  Expected End Date: 03/14/2022  Recent Progress: On track  Priority: High  Note:   Current Barriers:  Unable to independently monitor therapeutic efficacy Unable to maintain control of cholesterol  Pharmacist Clinical Goal(s):  Patient will achieve adherence to monitoring guidelines and medication adherence to achieve therapeutic efficacy maintain control of cholesterol as evidenced by next lipid panel  through collaboration with PharmD and provider.   Interventions: 1:1 collaboration with [name], MD regarding development and update of comprehensive plan of care as evidenced by provider attestation and co-signature Inter-disciplinary care team collaboration (see longitudinal plan of care) Comprehensive medication review performed; medication list updated in electronic medical record  Hyperlipidemia: (LDL goal < 100) -Uncontrolled -Current treatment: No medications -Medications previously tried: atorvastatin (myalgias)  -Current dietary patterns: limits fried foods -Current exercise habits: walking daily -Educated on Cholesterol goals;  Benefits of statin for ASCVD risk reduction; Importance of limiting foods high in cholesterol; -Counseled on diet and exercise extensively  Atrial Fibrillation (Goal: prevent stroke and major bleeding) -Controlled -CHADSVASC: 2 -Current treatment: Rate  control: none Anticoagulation: none -Medications previously tried: Eliquis & metoprolol (low risk; stopped by patient) -Home BP and HR readings: uses apple watch to monitor her HR  -Counseled on Afib action plan reviewed -Recommended monitoring heart rate and considering metoprolol if HR is > 100.  Depression/Anxiety (Goal: minimize symptoms) -Controlled -Current treatment: Citalopram 40 mg 1 tablet daily - Appropriate, Effective, Safe, Accessible -Medications previously tried/failed: Bupropion (no longer needed) -PHQ9: 0 -GAD7: n/a -Educated on Benefits of medication for symptom control Benefits of cognitive-behavioral therapy with or without medication -Counseled on diet and exercise extensively Recommended HIIT exercises for anxiety.  Insomnia (Goal: improve quality and quantity of sleep) -Controlled -Current treatment  Zolpidem 5 mg 1/2 to 1 tablet at bedtime as needed (only uses 1/2 tablet) - Appropriate, Effective, Safe, Accessible -Medications previously tried: Valerian root, melatonin (didn't help)  -Counseled on risks vs benefits with Ambien and consider going to as needed use.  Osteoporosis (Goal prevent fractures) -Controlled -Last DEXA Scan: 05/05/19   T-Score femoral neck: -2.3  T-Score total hip: -2.3  T-Score lumbar spine: -1.2  T-Score forearm radius: n/a  10-year probability of major osteoporotic fracture: n/a  10-year probability of hip fracture: n/a -Patient is a candidate for pharmacologic treatment due to T-Score < -2.5 in femoral neck and T-Score < -2.5 in total hip  -Current treatment  Prolia inject 60 mg every 6 months (last 11/20/20) - Appropriate, Effective, Safe, Accessible Vitamin D 1000 units daily -  not taking Calcium-mag-zinc vitamin 1 tablet daily (600 mg of calcium, 20 mcg of vitamin D) - Appropriate, Query effective, Safe, Accessible -Medications previously tried: Boniva (nausea)  -Recommend [phone] units of vitamin D daily. Recommend 1200 mg  of calcium daily from dietary and supplemental sources. Recommend weight-bearing and muscle strengthening exercises for building and maintaining bone density. -Counseled on  diet and exercise extensively Recommended to continue current medication Recommended repeat DEXA and vitamin D level  GERD (Goal: minimize symptoms) -Controlled -Current treatment  Famotidine 40 mg 1 tablet daily  - Appropriate, Effective, Safe, Accessible -Medications previously tried: Zantac (removed from market) -Recommended to continue current medication Patient is on due to difficulty swallowing.  Allergic rhinitis (Goal: minimize symptoms) -Controlled -Current treatment  Fluticasone 50 mcg/act 2 sprays in each nostril once daily  - Appropriate, Effective, Safe, Accessible  Diphenhydramine 25 mg as needed  - Appropriate, Effective, Query Safe, Accessible -Medications previously tried: Claritin, Zyrtec (ineffective)  -Counseled on sedation and fall risk with Benadryl and recommended using sparingly.    Health Maintenance -Vaccine gaps: none -Current therapy:  Sumatriptan 100 mg 1 tablet as needed Biotin 5000 mcg 1 tablet daily Probiotic daily Vitamin B12 2500 mcg 1 tablet daily -Educated on Cost vs benefit of each product must be carefully weighed by individual consumer -Patient is satisfied with current therapy and denies issues -Recommended to continue current medication  Patient Goals/Self-Care Activities Patient will:  - take medications as prescribed as evidenced by patient report and record review target a minimum of 150 minutes of moderate intensity exercise weekly  Follow Up Plan: Telephone follow up appointment with care management team member scheduled for:1 year       Patient verbalizes understanding of instructions and care plan provided today and agrees to view in [facility]. Active MyChart status and patient understanding of how to access instructions and care plan via MyChart confirmed  with patient.    Telephone follow up appointment with pharmacy team member scheduled for: 1 year  [name], [facility]

## 2021-10-08 ENCOUNTER — Ambulatory Visit: Payer: PPO | Admitting: Orthopaedic Surgery

## 2021-10-08 ENCOUNTER — Ambulatory Visit (INDEPENDENT_AMBULATORY_CARE_PROVIDER_SITE_OTHER): Payer: PPO

## 2021-10-08 DIAGNOSIS — Z96652 Presence of left artificial knee joint: Secondary | ICD-10-CM | POA: Diagnosis not present

## 2021-10-08 NOTE — Progress Notes (Signed)
Post-Op Visit Note   Patient: Brittany [name]           Date of Birth: 02-03-1946           MRN: [id number] Visit Date: 10/08/2021 PCP: [name], MD (Inactive)   Assessment & Plan:  Chief Complaint:  Chief Complaint  Patient presents with   Left Knee - Pain   Visit Diagnoses:  1. Status post total left knee replacement     Plan: 1 year TKA follow up plan  Patient is now one year out from a left total knee arthroplasty. Patient is doing very well and very pleased with the results. Radiographs reveal a total knee arthroplasty in good position, with no evidence of subsidence, loosening, or complicating features. It was reinforced that prophylactic antibiotics should be taken with any procedure including but not limited to dental work or colonoscopies. She will follow up in another year for repeat xrays.     Follow-Up Instructions: No follow-ups on file.   Orders:  Orders Placed This Encounter  Procedures   XR Knee 1-2 Views Left   No orders of the defined types were placed in this encounter.   Imaging: XR Knee 1-2 Views Left  Result Date: 10/08/2021 Stable total knee replacement in good alignment.    PMFS History: Patient Active Problem List   Diagnosis Date Noted   OSA (obstructive sleep apnea) 01/09/2021   Status post total left knee replacement 10/11/2020   Primary osteoarthritis of left knee 10/02/2020   Atrial fibrillation ([place]) 05/13/2018   Bloating 04/11/2018   Abnormal EKG 08/23/2017   Malignant neoplasm of overlapping sites of right breast in female, estrogen receptor positive ([place]) 08/17/2016   Hx of colonic polyps 08/11/2016   Iron deficiency anemia 07/28/2016   Blood donor 07/28/2016   Pain due to total right knee replacement ([place]) 04/06/2016   S/P right TKA 12/31/2015   Baker's cyst, unruptured 09/27/2015   Insomnia 07/31/2015   Onychomycosis 09/25/2014   Urinary frequency 05/21/2014   Mitral valve prolapse 05/08/2014   Mitral  regurgitation 05/08/2014   Dyslipidemia 02/09/2014   History of radiation therapy    Hiatal hernia    History of chemotherapy    Osteoporosis 03/21/2012   Seasonal and perennial allergic rhinitis 12/17/2011   B12 deficiency 10/09/2009   TOBACCO USE, QUIT 01/31/2009   THYROID NODULE 12/25/2008   GERD 11/23/2007   VARICOSE VEIN 12/29/2006   Anxiety state 12/23/2006   Depression 12/23/2006   Migraine variant 12/23/2006   Disorder of bone and cartilage 12/23/2006   Past Medical History:  Diagnosis Date   A-fib ([place])    Acid reflux    Acute cystitis with positive culture 03/06/2014   Anemia    Anxiety    Arthritis    Baker cyst, left 04/06/2016   Aspirated 04/06/2016   Breast cancer ([place]) 11/23/2007   right, ER/PR -, HER 2 -   Cognitive deficits 09/25/2014   2016 Very mild, could be related to meds    Degenerative arthritis of left knee 04/06/2016   Injection and aspiration 04/06/2016   Degenerative arthritis of right knee 09/27/2015   Depression    Esophageal stricture    Gastritis 10/2007   History of chemotherapy    neoadjuvant chemo, last dose 03/2008   History of radiation therapy 07/25/08 -09/11/08   right breast   Hx of colonic polyps 08/11/2016   Hyperlipidemia    Migraines    rare   Myalgia 07/18/2013  5/15 multiple site    OSA (obstructive sleep apnea)    severe OSA AHI 34, intolerant to PAP and now s/p hypoglossal nerve stimulator implant   Osteopenia    Rupture of quadriceps tendon 03/23/2017    Family History  Problem Relation Age of Onset   Heart disease Father    Cancer Father        lung-nonsmoker   Breast cancer Sister 46   Colon cancer Sister 97   Epilepsy Mother        at older age   Dementia Mother        ?uncertain type   Hip fracture Mother        x2   Other Brother        suicide   Other Maternal Grandfather        didn't go to doctor   Esophageal cancer Neg Hx    Stomach cancer Neg Hx     Past Surgical History:  Procedure  Laterality Date   ATRIAL FIBRILLATION ABLATION N/A 10/07/2018   Procedure: ATRIAL FIBRILLATION ABLATION;  Surgeon: [name], MD;  Location: [facility];  Service: Cardiovascular;  Laterality: N/A;   BREAST LUMPECTOMY  06/26/2008   right, CA   XRT, chemo   COLONOSCOPY     DRUG INDUCED ENDOSCOPY N/A 02/21/2020   Procedure: DRUG INDUCED ENDOSCOPY;  Surgeon: Melida Quitter, MD;  Location: [facility];  Service: ENT;  Laterality: N/A;   EYE SURGERY Bilateral    growth removed per right thigh     as teen - mole   IMPLANTATION OF HYPOGLOSSAL NERVE STIMULATOR N/A 05/15/2020   Procedure: IMPLANTATION OF HYPOGLOSSAL NERVE STIMULATOR;  Surgeon: Melida Quitter, MD;  Location: [facility];  Service: ENT;  Laterality: N/A;   JOINT REPLACEMENT  12/31/2015   RTK   QUADRICEPS TENDON REPAIR Right 09/14/2016   Procedure: OPEN REPAIR QUADRICEP TENDON;  Surgeon: [name], MD;  Location: [facility];  Service: Orthopedics;  Laterality: Right;   TOTAL KNEE ARTHROPLASTY Right 12/31/2015   Procedure: RIGHT TOTAL KNEE ARTHROPLASTY;  Surgeon: [name], MD;  Location: [facility];  Service: Orthopedics;  Laterality: Right;   TOTAL KNEE ARTHROPLASTY Left 10/11/2020   Procedure: TOTAL KNEE ARTHROPLASTY;  Surgeon: [name], MD;  Location: [facility];  Service: Orthopedics;  Laterality: Left;   UPPER GASTROINTESTINAL ENDOSCOPY     Social History   Occupational History    Employer: RETIRED  Tobacco Use   Smoking status: Former    Packs/day: 0.25    Years: 5.00    Total pack years: 1.25    Types: Cigarettes    Quit date: 03/03/1967    Years since quitting: 54.6   Smokeless tobacco: Never   Tobacco comments:    6 cig per day when she was smoking/quit years ago  Vaping Use   Vaping Use: Never used  Substance and Sexual Activity   Alcohol use: Yes    Alcohol/week: 7.0 standard drinks of alcohol    Types: 7 Cans of beer per week    Comment: 1 beer per day - 2.4 % alcholol - Bud [employer] 55    Drug use: No   Sexual activity: Yes    Partners: Male    Birth control/protection: Post-menopausal

## 2021-10-13 ENCOUNTER — Other Ambulatory Visit: Payer: Self-pay | Admitting: *Deleted

## 2021-10-13 MED ORDER — CITALOPRAM HYDROBROMIDE 40 MG PO TABS: 40.0000 mg | ORAL_TABLET | Freq: Every day | ORAL | 0 refills | Status: DC

## 2021-10-13 MED ORDER — FLUTICASONE PROPIONATE 50 MCG/ACT NA SUSP
NASAL | 0 refills | Status: DC
Start: 2021-10-13 — End: 2022-01-21

## 2021-10-19 ENCOUNTER — Other Ambulatory Visit: Payer: Self-pay | Admitting: Family

## 2021-10-27 ENCOUNTER — Ambulatory Visit (INDEPENDENT_AMBULATORY_CARE_PROVIDER_SITE_OTHER): Payer: PPO | Admitting: Family Medicine

## 2021-10-27 ENCOUNTER — Encounter: Payer: Self-pay | Admitting: Family Medicine

## 2021-10-27 VITALS — BP 118/78 | HR 67 | Temp 98.0°F | Ht 66.0 in | Wt 128.9 lb

## 2021-10-27 DIAGNOSIS — G4700 Insomnia, unspecified: Secondary | ICD-10-CM

## 2021-10-27 DIAGNOSIS — K449 Diaphragmatic hernia without obstruction or gangrene: Secondary | ICD-10-CM

## 2021-10-27 DIAGNOSIS — F411 Generalized anxiety disorder: Secondary | ICD-10-CM

## 2021-10-27 DIAGNOSIS — K219 Gastro-esophageal reflux disease without esophagitis: Secondary | ICD-10-CM | POA: Diagnosis not present

## 2021-10-27 MED ORDER — FAMOTIDINE 40 MG PO TABS: 40.0000 mg | ORAL_TABLET | Freq: Every day | ORAL | 3 refills | Status: DC

## 2021-10-27 MED ORDER — ZOLPIDEM TARTRATE 5 MG PO TABS: 2.5000 mg | ORAL_TABLET | Freq: Every evening | ORAL | 0 refills | Status: DC | PRN

## 2021-10-27 MED ORDER — CITALOPRAM HYDROBROMIDE 40 MG PO TABS: 40.0000 mg | ORAL_TABLET | Freq: Every day | ORAL | 3 refills | Status: DC

## 2021-10-27 NOTE — Progress Notes (Unsigned)
Established Patient Office Visit  Subjective   Patient ID: Brittany [name], female    DOB: [birth year]  Age: 76 y.o. MRN: [id number]  Chief Complaint  Patient presents with   Establish Care    Patient is here for transition of care visit today. States that she had right foot bunion surgery about 1 1/2 weeks ago. States that there is still some pain and swelling but it is getting better every day.   We reviewed her current problem list and her medications.   GERD-- pt needs refills on her pepcid. Reports good symptom control on this medication, denies any side effects.  Anxiety d/o-- pt reports she has been on celexa 40 mg daily for years. She reports good symptom control, she denies any trouble sleeping, no side effects reported today.  She reports she only take 2.5 mg of ambien at night to help her sleep. We discussed this medication and potential risks associated with this. I recommended that she try substituting melatonin 5 mg at night to see if this will provide the same effect. She is on a very small dose of the [place] - after discussing the potential risks including risk of falls, worsening dementia, and dependency she reports she wants to stay on the ambien at night.   Current Outpatient Medications  Medication Instructions   acetaminophen (TYLENOL) 500 mg, Oral, Every 6 hours PRN   Biotin 5,000 mcg, Oral, Daily   Calcium-Magnesium-Zinc (CAL-MAG-ZINC PO) 1 tablet, Oral, Daily, 600 mg    citalopram (CELEXA) 40 mg, Oral, Daily   Cyanocobalamin (VITAMIN B-12 PO) 2,500 mcg, Oral, Daily   denosumab (PROLIA) 60 mg, Subcutaneous, Every 6 months   diclofenac Sodium (VOLTAREN) 2 g, Topical, 4 times daily   diphenhydrAMINE (BENADRYL) 25 mg, Oral, Every 6 hours PRN   famotidine (PEPCID) 40 mg, Oral, Daily   Fish Oil 1,200 mg, Oral, Daily,     fluticasone (FLONASE) 50 MCG/ACT nasal spray SPRAY 2 SPRAYS INTO EACH NOSTRIL EVERY DAY   Probiotic Product (PROBIOTIC DAILY PO) 1 tablet, Oral, Daily    SUMAtriptan (IMITREX) 100 mg, Oral,  Once, Can repeat dose in 2 hours if symptoms persist. Max dose 2 tablets in 24 hours.   Vitamin D 2,000 Units, Oral, Daily   zolpidem (AMBIEN) 2.5-5 mg, Oral, At bedtime PRN     Patient Active Problem List   Diagnosis Date Noted   OSA (obstructive sleep apnea) 01/09/2021   Status post total left knee replacement 10/11/2020   Primary osteoarthritis of left knee 10/02/2020   Atrial fibrillation ([place]) 05/13/2018   Bloating 04/11/2018   Abnormal EKG 08/23/2017   Malignant neoplasm of overlapping sites of right breast in female, estrogen receptor positive ([place]) 08/17/2016   Hx of colonic polyps 08/11/2016   Iron deficiency anemia 07/28/2016   Blood donor 07/28/2016   Pain due to total right knee replacement ([place]) 04/06/2016   S/P right TKA 12/31/2015   Baker's cyst, unruptured 09/27/2015   Insomnia 07/31/2015   Onychomycosis 09/25/2014   Urinary frequency 05/21/2014   Mitral valve prolapse 05/08/2014   Mitral regurgitation 05/08/2014   Dyslipidemia 02/09/2014   History of radiation therapy    Hiatal hernia    History of chemotherapy    Osteoporosis 03/21/2012   Seasonal and perennial allergic rhinitis 12/17/2011   B12 deficiency 10/09/2009   TOBACCO USE, QUIT 01/31/2009   THYROID NODULE 12/25/2008   GERD 11/23/2007   VARICOSE VEIN 12/29/2006   Anxiety state 12/23/2006   Depression 12/23/2006  Migraine variant 12/23/2006   Disorder of bone and cartilage 12/23/2006      Review of Systems  All other systems reviewed and are negative.     Objective:     BP 118/78 (BP Location: Left Arm, Patient Position: Sitting, Cuff Size: Normal)   Pulse 67   Temp 98 F (36.7 C) (Oral)   Ht '5\' 6"'$  (1.676 m)   Wt 128 lb 14.4 oz (58.5 kg)   LMP 03/02/1994 (Approximate)   SpO2 98%   BMI 20.81 kg/m  BP Readings from Last 3 Encounters:  10/27/21 118/78  05/20/21 127/63  05/06/21 104/62      Physical Exam Vitals reviewed.   Constitutional:      Appearance: Normal appearance. She is well-groomed and normal weight.  Eyes:     Extraocular Movements: Extraocular movements intact.     Conjunctiva/sclera: Conjunctivae normal.  Cardiovascular:     Rate and Rhythm: Normal rate and regular rhythm.     Heart sounds: S1 normal and S2 normal.  Pulmonary:     Effort: Pulmonary effort is normal.     Breath sounds: Normal breath sounds and air entry.  Abdominal:     General: Bowel sounds are normal.     Palpations: Abdomen is soft.  Musculoskeletal:        General: Normal range of motion.     Cervical back: Normal range of motion and neck supple.     Right lower leg: No edema.     Left lower leg: No edema.  Skin:    General: Skin is warm and dry.  Neurological:     Mental Status: She is alert and oriented to person, place, and time. Mental status is at baseline.     Gait: Gait is intact.  Psychiatric:        Mood and Affect: Mood and affect normal.        Speech: Speech normal.        Behavior: Behavior normal.        Judgment: Judgment normal.      No results found for any visits on 10/27/21.  Last CBC Lab Results  Component Value Date   WBC 4.8 05/20/2021   HGB 12.7 05/20/2021   HCT 38.1 05/20/2021   MCV 93.8 05/20/2021   MCH 31.3 05/20/2021   RDW 12.7 05/20/2021   PLT 230 50/27/7412   Last metabolic panel Lab Results  Component Value Date   GLUCOSE 90 05/20/2021   NA 138 05/20/2021   K 3.7 05/20/2021   CL 102 05/20/2021   CO2 31 05/20/2021   BUN 12 05/20/2021   CREATININE 0.67 05/20/2021   GFRNONAA >60 05/20/2021   CALCIUM 9.6 05/20/2021   PROT 7.0 05/20/2021   ALBUMIN 4.1 05/20/2021   BILITOT 0.4 05/20/2021   ALKPHOS 50 05/20/2021   AST 16 05/20/2021   ALT 8 05/20/2021   ANIONGAP 5 05/20/2021      The 10-year ASCVD risk score (Arnett DK, et al., 2019) is: 12.7%    Assessment & Plan:   Problem List Items Addressed This Visit       Digestive   GERD    Well controlled on  the pepcid 40 mg daily, will continue this medication for her.       Relevant Medications   famotidine (PEPCID) 40 MG tablet     Other   Anxiety state - Primary    Well controlled with celexa 40 mg daily, refills written for patient today.  Relevant Medications   citalopram (CELEXA) 40 MG tablet   Insomnia    Discussed potential risks associated with Lorrin Mais in age >59, patient voiced understanding and she wishes to remain on the medication. Will continue the 2.5 mg lowest dose to minimize the risk.       Relevant Medications   zolpidem (AMBIEN) 5 MG tablet   I spent 35 minutes with the patient reviewing her medical history, and discussing the risks and benefits of Lorrin Mais which is considered a high risk medication in this population.  Return in about 6 months (around 04/29/2022) for follow up medication refills.    Farrel Conners, MD

## 2021-10-27 NOTE — Patient Instructions (Addendum)
Ask your oncologist about the Prolia, how long they will keep you on this medication before giving it a break.   Reminder for vaccines due:  High dose Flu, Covid booster and RSV vaccine.  Please come fasting to your next visit-- we will be getting your annual bloodwork.

## 2021-10-28 NOTE — Assessment & Plan Note (Signed)
Well controlled with celexa 40 mg daily, refills written for patient today.

## 2021-10-28 NOTE — Assessment & Plan Note (Signed)
Discussed potential risks associated with Brittany [name] in age >11, patient voiced understanding and she wishes to remain on the medication. Will continue the 2.5 mg lowest dose to minimize the risk.

## 2021-10-28 NOTE — Assessment & Plan Note (Signed)
Well controlled on the pepcid 40 mg daily, will continue this medication for her.

## 2021-10-30 ENCOUNTER — Ambulatory Visit: Payer: PPO | Attending: Nurse Practitioner | Admitting: Nurse Practitioner

## 2021-10-30 ENCOUNTER — Encounter: Payer: Self-pay | Admitting: Nurse Practitioner

## 2021-10-30 VITALS — BP 112/64 | HR 85 | Ht 67.0 in | Wt 128.8 lb

## 2021-10-30 DIAGNOSIS — G4733 Obstructive sleep apnea (adult) (pediatric): Secondary | ICD-10-CM

## 2021-10-30 DIAGNOSIS — I340 Nonrheumatic mitral (valve) insufficiency: Secondary | ICD-10-CM | POA: Diagnosis not present

## 2021-10-30 DIAGNOSIS — Z9989 Dependence on other enabling machines and devices: Secondary | ICD-10-CM | POA: Diagnosis not present

## 2021-10-30 DIAGNOSIS — E785 Hyperlipidemia, unspecified: Secondary | ICD-10-CM

## 2021-10-30 DIAGNOSIS — I480 Paroxysmal atrial fibrillation: Secondary | ICD-10-CM | POA: Diagnosis not present

## 2021-10-30 NOTE — Progress Notes (Signed)
Cardiology Office Note:    Date:  10/30/2021   ID:  Brittany [name], DOB 1946-01-27, MRN [id number]  PCP:  Farrel Conners, MD   [facility] Providers Cardiologist:  Candee Furbish, MD Electrophysiologist:  Will Meredith Leeds, MD  Sleep Medicine:  [name], MD     Referring MD: No ref. provider found   Chief Complaint: annual follow-up a fib  History of Present Illness:    Brittany [name] is a very pleasant 76 y.o. female with a hx of OSA on CPAP/Inspire device implant, atrial fibrillation s/p ablation 10/2018, breast cancer s/p ectomy, chemo, and XRT.  Cardiac monitor revealed atrial fibrillation following symptoms of palpitations for many years.  A previous treadmill test showed a brief episode of SVT for which she was asymptomatic.  She went to the ER 04/18/2018 with rapid atrial fibrillation rates 175.  She had palpitations with racing and pounding. She underwent A-fib ablation 10/07/2018. NST for chest pain was low risk 10/2019. Coronary calcium score of 0.   Underwent home sleep study which showed severe obstructive sleep apnea she was referred to ENT for evaluation for hypoglossal nerve stimulator.  She underwent inspire device implantation 05/15/2020.  Additionally she uses CPAP.  She saw Dr. Radford Pax for sleep evaluation on 05/06/2021 and was advised to return for follow-up after inspire titration.  She was last seen by Dr. Marlou Porch on 04/15/20.  She reported occasional dizziness and soft BP, metoprolol 12.5 mg twice daily was discontinued.  She was advised to return in 6 months for follow-up. EP follow-up with Dr. Curt Bears was 06/11/20 at which time he discussed possibly d/c Eliquis, she elected to continue. Was advised to return in 1 year.   Today, she reports she is feeling well.  She has no specific cardiac concerns.  States BP has been well controlled since stopping metoprolol, continues to be on the low side of normal. Has stopped taking Eliquis. Usually works out at [employer] but has been  unable to work out due to recent foot and knee surgery. Is otherwise feeling well. She denies chest pain, shortness of breath, lower extremity edema, fatigue, palpitations, melena, hematuria, hemoptysis, diaphoresis, weakness, presyncope, syncope, orthopnea, and PND.  Past Medical History:  Diagnosis Date   A-fib ([place])    Acid reflux    Acute cystitis with positive culture 03/06/2014   Anemia    Anxiety    Arthritis    Baker cyst, left 04/06/2016   Aspirated 04/06/2016   Breast cancer ([place]) 11/23/2007   right, ER/PR -, HER 2 -   Cognitive deficits 09/25/2014   2016 Very mild, could be related to meds    Degenerative arthritis of left knee 04/06/2016   Injection and aspiration 04/06/2016   Degenerative arthritis of right knee 09/27/2015   Depression    Esophageal stricture    Gastritis 10/2007   History of chemotherapy    neoadjuvant chemo, last dose 03/2008   History of radiation therapy 07/25/08 -09/11/08   right breast   Hx of colonic polyps 08/11/2016   Hyperlipidemia    Migraines    rare   Myalgia 07/18/2013   5/15 multiple site    OSA (obstructive sleep apnea)    severe OSA AHI 34, intolerant to PAP and now s/p hypoglossal nerve stimulator implant   Osteopenia    Rupture of quadriceps tendon 03/23/2017    Past Surgical History:  Procedure Laterality Date   ATRIAL FIBRILLATION ABLATION N/A 10/07/2018   Procedure: ATRIAL FIBRILLATION  ABLATION;  Surgeon: [name], MD;  Location: [facility];  Service: Cardiovascular;  Laterality: N/A;   BREAST LUMPECTOMY  06/26/2008   right, CA   XRT, chemo   COLONOSCOPY     DRUG INDUCED ENDOSCOPY N/A 02/21/2020   Procedure: DRUG INDUCED ENDOSCOPY;  Surgeon: Melida Quitter, MD;  Location: [facility];  Service: ENT;  Laterality: N/A;   EYE SURGERY Bilateral    growth removed per right thigh     as teen - mole   IMPLANTATION OF HYPOGLOSSAL NERVE STIMULATOR N/A 05/15/2020   Procedure: IMPLANTATION OF  HYPOGLOSSAL NERVE STIMULATOR;  Surgeon: Melida Quitter, MD;  Location: [facility];  Service: ENT;  Laterality: N/A;   JOINT REPLACEMENT  12/31/2015   RTK   QUADRICEPS TENDON REPAIR Right 09/14/2016   Procedure: OPEN REPAIR QUADRICEP TENDON;  Surgeon: [name], MD;  Location: [facility];  Service: Orthopedics;  Laterality: Right;   TOTAL KNEE ARTHROPLASTY Right 12/31/2015   Procedure: RIGHT TOTAL KNEE ARTHROPLASTY;  Surgeon: [name], MD;  Location: [facility];  Service: Orthopedics;  Laterality: Right;   TOTAL KNEE ARTHROPLASTY Left 10/11/2020   Procedure: TOTAL KNEE ARTHROPLASTY;  Surgeon: [name], MD;  Location: [place];  Service: Orthopedics;  Laterality: Left;   UPPER GASTROINTESTINAL ENDOSCOPY      Current Medications: Current Meds  Medication Sig   acetaminophen (TYLENOL) 500 MG tablet Take 500 mg by mouth every 6 (six) hours as needed.   Biotin 5000 MCG TABS Take 5,000 mcg by mouth daily.   Calcium-Magnesium-Zinc (CAL-MAG-ZINC PO) Take 1 tablet by mouth daily. 600 mg   Cholecalciferol (VITAMIN D) 50 MCG (2000 UT) tablet Take 2,000 Units by mouth daily.   citalopram (CELEXA) 40 MG tablet Take 1 tablet (40 mg total) by mouth daily.   Cyanocobalamin (VITAMIN B-12 PO) Take 2,500 mcg by mouth daily.   denosumab (PROLIA) 60 MG/ML SOSY injection Inject 60 mg into the skin every 6 (six) months.   diclofenac Sodium (VOLTAREN) 1 % GEL Apply 2 g topically 4 (four) times daily.   diphenhydrAMINE (BENADRYL) 25 MG tablet Take 25 mg by mouth every 6 (six) hours as needed for allergies.   famotidine (PEPCID) 40 MG tablet Take 1 tablet (40 mg total) by mouth daily.   fluticasone (FLONASE) 50 MCG/ACT nasal spray SPRAY 2 SPRAYS INTO EACH NOSTRIL EVERY DAY   Omega-3 Fatty Acids (FISH OIL) 1200 MG CAPS Take 1,200 mg by mouth daily.   Probiotic Product (PROBIOTIC DAILY PO) Take 1 tablet by mouth daily.   SUMAtriptan (IMITREX) 100 MG tablet Take 1 tablet (100 mg total) by mouth once for 1 dose. Can repeat  dose in 2 hours if symptoms persist. Max dose 2 tablets in 24 hours.   zolpidem (AMBIEN) 5 MG tablet Take 0.5-1 tablets (2.5-5 mg total) by mouth at bedtime as needed for sleep.   Current Facility-Administered Medications for the 10/30/21 encounter (Office Visit) with Ann Maki, Lanice Schwab, NP  Medication   0.9 %  sodium chloride infusion     Allergies:   Boniva [ibandronic acid], Hydrocodone-acetaminophen, Lipitor [atorvastatin], and Tramadol   Social History   Socioeconomic History   Marital status: Married    Spouse name: Not on file   Number of children: 3   Years of education: Not on file   Highest education level: Associate degree: academic program  Occupational History    Employer: RETIRED  Tobacco Use   Smoking status: Former    Packs/day: 0.25  Years: 5.00    Total pack years: 1.25    Types: Cigarettes    Quit date: 03/03/1967    Years since quitting: 54.6   Smokeless tobacco: Never   Tobacco comments:    6 cig per day when she was smoking/quit years ago  Vaping Use   Vaping Use: Never used  Substance and Sexual Activity   Alcohol use: Yes    Alcohol/week: 7.0 standard drinks of alcohol    Types: 7 Cans of beer per week    Comment: 1 beer per day - 2.4 % alcholol - [employer] 55   Drug use: No   Sexual activity: Yes    Partners: Male    Birth control/protection: Post-menopausal  Other Topics Concern   Not on file  Social History Narrative   G3, P3, 1st pregnancy age 68, menopause age 66, no HRT   Social Determinants of Health   Financial Resource Strain: Low Risk  (04/19/2021)   Overall Financial Resource Strain (CARDIA)    Difficulty of Paying Living Expenses: Not hard at all  Food Insecurity: No Food Insecurity (04/19/2021)   Hunger Vital Sign    Worried About Running Out of Food in the Last Year: Never true    Ran Out of Food in the Last Year: Never true  Transportation Needs: No Transportation Needs (04/19/2021)   PRAPARE - Radiographer, therapeutic (Medical): No    Lack of Transportation (Non-Medical): No  Physical Activity: Sufficiently Active (04/19/2021)   Exercise Vital Sign    Days of Exercise per Week: 3 days    Minutes of Exercise per Session: 60 min  Stress: No Stress Concern Present (04/19/2021)   [place]    Feeling of Stress : Not at all  Social Connections: [place] (04/19/2021)   Social Connection and Isolation Panel [NHANES]    Frequency of Communication with Friends and Family: More than three times a week    Frequency of Social Gatherings with Friends and Family: Three times a week    Attends Religious Services: More than 4 times per year    Active Member of Clubs or Organizations: Yes    Attends Music therapist: More than 4 times per year    Marital Status: Married     Family History: The patient's family history includes Breast cancer (age of onset: 87) in her sister; Cancer in her father; Colon cancer (age of onset: 51) in her sister; Dementia in her mother; Epilepsy in her mother; Heart disease in her father; Hip fracture in her mother; Other in her brother and maternal grandfather. There is no history of Esophageal cancer or Stomach cancer.  ROS:   Please see the history of present illness.   All other systems reviewed and are negative.  Labs/Other Studies Reviewed:    The following studies were reviewed today:  CT Cardiac Score 04/22/21  IMPRESSION: Coronary calcium score of 0.  Lexiscan Myoview 10/23/19  The left ventricular ejection fraction is normal (55-65%). Nuclear stress EF: 56%. There are no Mierzwa motion abnormalities. There was no ST segment deviation noted during stress. Defect 1: There is a small defect of mild severity present in the apex location. This likely represents apical thinning. No infarct, no ischemia noted. This is a low risk study.  A Fib Ablation 10/07/18  PROCEDURES: 1.  Comprehensive electrophysiologic study. 2. Coronary sinus pacing and recording. 3. Three-dimensional mapping of atrial fibrillation with  additional mapping and ablation of a second discrete focus 4. Ablation of atrial fibrillation with additional mapping and ablation of a second discrete focus 5. Intracardiac echocardiography. 6. Transseptal puncture of an intact septum. 7. Arrhythmia induction with pacing with dobutamine infusion   Echo 12/29/17  Left ventricle: The cavity size was normal. Pellow thickness was    normal. Systolic function was normal. The estimated ejection    fraction was in the range of 55% to 60%. Left ventricular    diastolic function parameters were normal.  - Aortic valve: Sclerosis without stenosis.  - Mitral valve: Leaflets are severely thickened and calcified with    posterior leaflet prolapse but only trivial MR.  - Atrial septum: A patent foramen ovale cannot be excluded.   Recent Labs: 05/20/2021: ALT 8; BUN 12; Creatinine 0.67; Hemoglobin 12.7; Platelet Count 230; Potassium 3.7; Sodium 138  Recent Lipid Panel    Component Value Date/Time   CHOL 232 (H) 09/25/2020 1004   TRIG 86.0 09/25/2020 1004   HDL 64.50 09/25/2020 1004   CHOLHDL 4 09/25/2020 1004   VLDL 17.2 09/25/2020 1004   LDLCALC 150 (H) 09/25/2020 1004   LDLDIRECT 178.9 03/23/2013 1059     Risk Assessment/Calculations:      Physical Exam:    VS:  BP 112/64   Pulse 85   Ht _0  (1.702 m)   Wt 128 lb 12.8 oz (58.4 kg)   LMP 03/02/1994 (Approximate)   SpO2 96%   BMI 20.17 kg/m     Wt Readings from Last 3 Encounters:  10/30/21 128 lb 12.8 oz (58.4 kg)  10/27/21 128 lb 14.4 oz (58.5 kg)  05/06/21 124 lb 3.2 oz (56.3 kg)     GEN:  Well nourished, well developed in no acute distress HEENT: Normal NECK: No JVD; No carotid bruits CARDIAC: RRR, no murmurs, rubs, gallops RESPIRATORY:  Clear to auscultation without rales, wheezing or rhonchi  ABDOMEN: Soft, non-tender,  non-distended MUSCULOSKELETAL:  No edema; Deformity to right ankle/foot s/p recent surgery. 2+ pedal pulses, equal bilaterally SKIN: Warm and dry NEUROLOGIC:  Alert and oriented x 3 PSYCHIATRIC:  Normal affect   EKG:  EKG is  ordered today.  The ekg ordered today NSR at 85 bpm, voltage criteria for LVH, no acute change from previous  Diagnoses:    1. Paroxysmal atrial fibrillation (HCC)   2. OSA on CPAP   3. Nonrheumatic mitral valve regurgitation   4. Hyperlipidemia, unspecified hyperlipidemia type    Assessment and Plan:     PAF on chronic anticoagulation: NSR at 85 bpm on EKG today.  No return of atrial fibrillation to her awareness. She is no longer on Eliquis. Metoprolol was stopped in the setting of low BP.  Advised her to notify us if symptoms return.  OSA: History of Inspire device placement. Continues to be compliant with CPAP.  Has an appointment 9/7 with Dr. Radford Pax for titration.  Mitral regurgitation: She has posterior leaflet prolapse with trivial mitral regurgitation on echo 11/2017.  I do not appreciate a significant murmur today.  She is asymptomatic.  We will continue to monitor clinically.  Hyperlipidemia: LDL 158 on 09/25/2020.  Calcium score of 0 on 04/2021.  Discussed the importance of LDL goal less than 100.  We will recheck in 1 week when she returns to office for sleep appointment with Dr. Radford Pax. Briefly discussed starting medication if LDL remains elevated.   Disposition: 1 year with Dr. Marlou Porch  Medication Adjustments/Labs and Tests Ordered: Current medicines  are reviewed at length with the patient today.  Concerns regarding medicines are outlined above.  Orders Placed This Encounter  Procedures   Lipid Profile   Comp Met (CMET)   EKG 12-Lead   No orders of the defined types were placed in this encounter.   Patient Instructions  Medication Instructions:   Your physician recommends that you continue on your current medications as directed. Please refer  to the Current Medication list given to you today.   *If you need a refill on your cardiac medications before your next appointment, please call your pharmacy*   Lab Work:  Your physician recommends that you return for a FASTING lipid profile/cmet on Thursday, September 7. You can come in on the day of your appointment anytime between 7:30-4:30 fasting from midnight the night before.   If you have labs (blood work) drawn today and your tests are completely normal, you will receive your results only by: [place] (if you have MyChart) OR A paper copy in the mail If you have any lab test that is abnormal or we need to change your treatment, we will call you to review the results.   Testing/Procedures:  None ordered.   Follow-Up: At [facility], you and your health needs are our priority.  As part of our continuing mission to provide you with exceptional heart care, we have created designated Provider Care Teams.  These Care Teams include your primary Cardiologist (physician) and Advanced Practice Providers (APPs -  Physician Assistants and Nurse Practitioners) who all work together to provide you with the care you need, when you need it.  We recommend signing up for the patient portal called "MyChart".  Sign up information is provided on this After Visit Summary.  MyChart is used to connect with patients for Virtual Visits (Telemedicine).  Patients are able to view lab/test results, encounter notes, upcoming appointments, etc.  Non-urgent messages can be sent to your provider as well.   To learn more about what you can do with MyChart, go to [email or web address].    Your next appointment:   1 year(s)  The format for your next appointment:   In Person  Provider:   Candee Furbish, MD     Other Instructions  Your physician wants you to follow-up in: 1 year with Dr. Marlou Porch.  You will receive a reminder letter in the mail two months in advance. If you don't receive a  letter, please call our office to schedule the follow-up appointment.   Important Information About Sugar         Signed, Emmaline Life, NP  10/30/2021 2:16 PM    [place]

## 2021-10-30 NOTE — Patient Instructions (Signed)
Medication Instructions:   Your physician recommends that you continue on your current medications as directed. Please refer to the Current Medication list given to you today.   *If you need a refill on your cardiac medications before your next appointment, please call your pharmacy*   Lab Work:  Your physician recommends that you return for a FASTING lipid profile/cmet on Thursday, September 7. You can come in on the day of your appointment anytime between 7:30-4:30 fasting from midnight the night before.   If you have labs (blood work) drawn today and your tests are completely normal, you will receive your results only by: [place] (if you have MyChart) OR A paper copy in the mail If you have any lab test that is abnormal or we need to change your treatment, we will call you to review the results.   Testing/Procedures:  None ordered.   Follow-Up: At [facility], you and your health needs are our priority.  As part of our continuing mission to provide you with exceptional heart care, we have created designated Provider Care Teams.  These Care Teams include your primary Cardiologist (physician) and Advanced Practice Providers (APPs -  Physician Assistants and Nurse Practitioners) who all work together to provide you with the care you need, when you need it.  We recommend signing up for the patient portal called "MyChart".  Sign up information is provided on this After Visit Summary.  MyChart is used to connect with patients for Virtual Visits (Telemedicine).  Patients are able to view lab/test results, encounter notes, upcoming appointments, etc.  Non-urgent messages can be sent to your provider as well.   To learn more about what you can do with MyChart, go to [email or web address].    Your next appointment:   1 year(s)  The format for your next appointment:   In Person  Provider:   Candee Furbish, MD     Other Instructions  Your physician wants you to  follow-up in: 1 year with Dr. Marlou Porch.  You will receive a reminder letter in the mail two months in advance. If you don't receive a letter, please call our office to schedule the follow-up appointment.   Important Information About Sugar

## 2021-11-05 ENCOUNTER — Encounter: Payer: Self-pay | Admitting: Podiatry

## 2021-11-05 ENCOUNTER — Ambulatory Visit (INDEPENDENT_AMBULATORY_CARE_PROVIDER_SITE_OTHER): Payer: PPO

## 2021-11-05 ENCOUNTER — Ambulatory Visit (INDEPENDENT_AMBULATORY_CARE_PROVIDER_SITE_OTHER): Payer: PPO | Admitting: Podiatry

## 2021-11-05 DIAGNOSIS — Z9889 Other specified postprocedural states: Secondary | ICD-10-CM

## 2021-11-05 DIAGNOSIS — I480 Paroxysmal atrial fibrillation: Secondary | ICD-10-CM | POA: Diagnosis not present

## 2021-11-05 DIAGNOSIS — Z87891 Personal history of nicotine dependence: Secondary | ICD-10-CM | POA: Diagnosis not present

## 2021-11-05 DIAGNOSIS — I700 Atherosclerosis of aorta: Secondary | ICD-10-CM | POA: Diagnosis not present

## 2021-11-06 ENCOUNTER — Ambulatory Visit: Payer: PPO | Attending: Cardiology | Admitting: Cardiology

## 2021-11-06 ENCOUNTER — Encounter: Payer: Self-pay | Admitting: Cardiology

## 2021-11-06 ENCOUNTER — Ambulatory Visit: Payer: PPO

## 2021-11-06 VITALS — BP 102/70 | HR 80 | Ht 67.0 in | Wt 127.0 lb

## 2021-11-06 DIAGNOSIS — I480 Paroxysmal atrial fibrillation: Secondary | ICD-10-CM

## 2021-11-06 DIAGNOSIS — G4733 Obstructive sleep apnea (adult) (pediatric): Secondary | ICD-10-CM

## 2021-11-06 DIAGNOSIS — Z9989 Dependence on other enabling machines and devices: Secondary | ICD-10-CM

## 2021-11-06 LAB — COMPREHENSIVE METABOLIC PANEL
ALT: 12 IU/L (ref 0–32)
AST: 17 IU/L (ref 0–40)
Albumin/Globulin Ratio: 1.7 (ref 1.2–2.2)
Albumin: 4.6 g/dL (ref 3.8–4.8)
Alkaline Phosphatase: 54 IU/L (ref 44–121)
BUN/Creatinine Ratio: 12 (ref 12–28)
BUN: 8 mg/dL (ref 8–27)
Bilirubin Total: 0.4 mg/dL (ref 0.0–1.2)
CO2: 24 mmol/L (ref 20–29)
Calcium: 9.6 mg/dL (ref 8.7–10.3)
Chloride: 101 mmol/L (ref 96–106)
Creatinine, Ser: 0.68 mg/dL (ref 0.57–1.00)
Globulin, Total: 2.7 g/dL (ref 1.5–4.5)
Glucose: 94 mg/dL (ref 70–99)
Potassium: 4.1 mmol/L (ref 3.5–5.2)
Sodium: 138 mmol/L (ref 134–144)
Total Protein: 7.3 g/dL (ref 6.0–8.5)
eGFR: 91 mL/min/{1.73_m2} (ref >59)

## 2021-11-06 LAB — LIPID PANEL
Chol/HDL Ratio: 4 ratio (ref 0.0–4.4)
Cholesterol, Total: 258 mg/dL — ABNORMAL HIGH (ref 100–199)
HDL: 64 mg/dL (ref >39)
LDL Chol Calc (NIH): 183 mg/dL — ABNORMAL HIGH (ref 0–99)
Triglycerides: 69 mg/dL (ref 0–149)
VLDL Cholesterol Cal: 11 mg/dL (ref 5–40)

## 2021-11-06 NOTE — Patient Instructions (Signed)
Medication Instructions:  Your physician recommends that you continue on your current medications as directed. Please refer to the Current Medication list given to you today.  *If you need a refill on your cardiac medications before your next appointment, please call your pharmacy*  Testing/Procedures: [place] sleep study prior to your appointment with Dr. Radford Pax - please give [place] a call two weeks prior to your appointment so that you can come pick up a study.   Follow-Up: At [facility], you and your health needs are our priority.  As part of our continuing mission to provide you with exceptional heart care, we have created designated Provider Care Teams.  These Care Teams include your primary Cardiologist (physician) and Advanced Practice Providers (APPs -  Physician Assistants and Nurse Practitioners) who all work together to provide you with the care you need, when you need it.  Your next appointment:   6 month(s)  The format for your next appointment:   In Person  Provider:   [name], MD

## 2021-11-06 NOTE — Progress Notes (Signed)
Date:  11/06/2021   ID:  Brittany [name], DOB 31-Dec-1945, MRN [id number] The patient was identified using 2 identifiers.  PCP:  Emmaline Life, NP   [facility] Providers Cardiologist:  Candee Furbish, MD Electrophysiologist:  Will Meredith Leeds, MD  Sleep Medicine:  [name], MD     Evaluation Performed:  Follow-Up Visit  Chief Complaint:  OSA  History of Present Illness:    Brittany [name] is a 76 y.o. female with who is followed by Dr. Marlou Porch for atrial fibrillation who was referred for sleep study.  She underwent home sleep study which showed severe obstructive sleep apnea with an AHI of 34.4/h and moderate oxygen desaturations to 81%.  She underwent CPAP titration to 12 cm H2O.   She was referred her to ENT for evaluation for the hypoglossal nerve stimulator.  She underwent Inspire device implantation 05/15/2020. She underwent in lab titration 01/09/2021 with a therapeutic amplitude of 1.4V with an AHI of 7.5/hr and O2 sats as low as 88%.  Her device was programmed to amplitude of 1.3V   At her last office visit she was on amplitude of 1.5 V and is doing great.  Her device was changed to a therapeutic range of 1.3 to 1.7 V.  She is now here today and is doing great.  She denies any problems with tongue sensitivity or sore throat.  She feels rested when she gets up in the morning.  She is using her device on average 8 hours nightly.   She is currently on level 3 which is 1.5 V.  Past Medical History:  Diagnosis Date   A-fib ([place])    Acid reflux    Acute cystitis with positive culture 03/06/2014   Anemia    Anxiety    Arthritis    Baker cyst, left 04/06/2016   Aspirated 04/06/2016   Breast cancer ([place]) 11/23/2007   right, ER/PR -, HER 2 -   Cognitive deficits 09/25/2014   2016 Very mild, could be related to meds    Degenerative arthritis of left knee 04/06/2016   Injection and aspiration 04/06/2016   Degenerative arthritis of right knee 09/27/2015   Depression     Esophageal stricture    Gastritis 10/2007   History of chemotherapy    neoadjuvant chemo, last dose 03/2008   History of radiation therapy 07/25/08 -09/11/08   right breast   Hx of colonic polyps 08/11/2016   Hyperlipidemia    Migraines    rare   Myalgia 07/18/2013   5/15 multiple site    OSA (obstructive sleep apnea)    severe OSA AHI 34, intolerant to PAP and now s/p hypoglossal nerve stimulator implant   Osteopenia    Rupture of quadriceps tendon 03/23/2017   Past Surgical History:  Procedure Laterality Date   ATRIAL FIBRILLATION ABLATION N/A 10/07/2018   Procedure: ATRIAL FIBRILLATION ABLATION;  Surgeon: [name], MD;  Location: [place] [facility];  Service: Cardiovascular;  Laterality: N/A;   BREAST LUMPECTOMY  06/26/2008   right, CA   XRT, chemo   COLONOSCOPY     DRUG INDUCED ENDOSCOPY N/A 02/21/2020   Procedure: DRUG INDUCED ENDOSCOPY;  Surgeon: Melida Quitter, MD;  Location: [place];  Service: ENT;  Laterality: N/A;   EYE SURGERY Bilateral    growth removed per right thigh     as teen - mole   IMPLANTATION OF HYPOGLOSSAL NERVE STIMULATOR N/A 05/15/2020   Procedure: IMPLANTATION OF HYPOGLOSSAL NERVE STIMULATOR;  Surgeon: Melida Quitter, MD;  [facility];  Service: ENT;  Laterality: N/A;   JOINT REPLACEMENT  12/31/2015   RTK   QUADRICEPS TENDON REPAIR Right 09/14/2016   Procedure: OPEN REPAIR QUADRICEP TENDON;  Surgeon: [name], MD;  Location: [facility];  Service: Orthopedics;  Laterality: Right;   TOTAL KNEE ARTHROPLASTY Right 12/31/2015   Procedure: RIGHT TOTAL KNEE ARTHROPLASTY;  Surgeon: [name], MD;  Location: [facility];  Service: Orthopedics;  Laterality: Right;   TOTAL KNEE ARTHROPLASTY Left 10/11/2020   Procedure: TOTAL KNEE ARTHROPLASTY;  Surgeon: [name], MD;  Location: [place];  Service: Orthopedics;  Laterality: Left;   UPPER GASTROINTESTINAL ENDOSCOPY       Current Meds  Medication Sig   acetaminophen (TYLENOL) 500 MG  tablet Take 500 mg by mouth every 6 (six) hours as needed.   Biotin 5000 MCG TABS Take 5,000 mcg by mouth daily.   Calcium-Magnesium-Zinc (CAL-MAG-ZINC PO) Take 1 tablet by mouth daily. 600 mg   Cholecalciferol (VITAMIN D) 50 MCG (2000 UT) tablet Take 2,000 Units by mouth daily.   citalopram (CELEXA) 40 MG tablet Take 1 tablet (40 mg total) by mouth daily.   Cyanocobalamin (VITAMIN B-12 PO) Take 2,500 mcg by mouth daily.   denosumab (PROLIA) 60 MG/ML SOSY injection Inject 60 mg into the skin every 6 (six) months.   diclofenac Sodium (VOLTAREN) 1 % GEL Apply 2 g topically 4 (four) times daily.   diphenhydrAMINE (BENADRYL) 25 MG tablet Take 25 mg by mouth every 6 (six) hours as needed for allergies.   famotidine (PEPCID) 40 MG tablet Take 1 tablet (40 mg total) by mouth daily.   fluticasone (FLONASE) 50 MCG/ACT nasal spray SPRAY 2 SPRAYS INTO EACH NOSTRIL EVERY DAY   Omega-3 Fatty Acids (FISH OIL) 1200 MG CAPS Take 1,200 mg by mouth daily.   Probiotic Product (PROBIOTIC DAILY PO) Take 1 tablet by mouth daily.   SUMAtriptan (IMITREX) 100 MG tablet Take 1 tablet (100 mg total) by mouth once for 1 dose. Can repeat dose in 2 hours if symptoms persist. Max dose 2 tablets in 24 hours.   zolpidem (AMBIEN) 5 MG tablet Take 0.5-1 tablets (2.5-5 mg total) by mouth at bedtime as needed for sleep.   Current Facility-Administered Medications for the 11/06/21 encounter (Office Visit) with [name], MD  Medication   0.9 %  sodium chloride infusion     Allergies:   Boniva [ibandronic acid], Hydrocodone-acetaminophen, Lipitor [atorvastatin], and Tramadol   Social History   Tobacco Use   Smoking status: Former    Packs/day: 0.25    Years: 5.00    Total pack years: 1.25    Types: Cigarettes    Quit date: 03/03/1967    Years since quitting: 54.7   Smokeless tobacco: Never   Tobacco comments:    6 cig per day when she was smoking/quit years ago  Vaping Use   Vaping Use: Never used  Substance Use  Topics   Alcohol use: Yes    Alcohol/week: 7.0 standard drinks of alcohol    Types: 7 Cans of beer per week    Comment: 1 beer per day - 2.4 % alcholol - [employer] 55   Drug use: No     Family Hx: The patient's family history includes Breast cancer (age of onset: 42) in her sister; Cancer in her father; Colon cancer (age of onset: 48) in her sister; Dementia in her mother; Epilepsy in her mother; Heart disease in her  father; Hip fracture in her mother; Other in her brother and maternal grandfather. There is no history of Esophageal cancer or Stomach cancer.  ROS:   Please see the history of present illness.    none All other systems reviewed and are negative.   Prior CV studies:   The following studies were reviewed today:  none  Labs/Other Tests and Data Reviewed:    EKG:  No ECG reviewed.  Recent Labs: 05/20/2021: ALT 8; BUN 12; Creatinine 0.67; Hemoglobin 12.7; Platelet Count 230; Potassium 3.7; Sodium 138   Recent Lipid Panel Lab Results  Component Value Date/Time   CHOL 232 (H) 09/25/2020 10:04 AM   TRIG 86.0 09/25/2020 10:04 AM   HDL 64.50 09/25/2020 10:04 AM   CHOLHDL 4 09/25/2020 10:04 AM   LDLCALC 150 (H) 09/25/2020 10:04 AM   LDLDIRECT 178.9 03/23/2013 10:59 AM    Wt Readings from Last 3 Encounters:  11/06/21 127 lb (57.6 kg)  10/30/21 128 lb 12.8 oz (58.4 kg)  10/27/21 128 lb 14.4 oz (58.5 kg)     Objective:    Vital Signs:  BP 102/70   Pulse 80   Ht '5\' 7"'$  (1.702 m)   Wt 127 lb (57.6 kg)   LMP 03/02/1994 (Approximate)   SpO2 96%   BMI 19.89 kg/m   GEN: Well nourished, well developed in no acute distress HEENT: Normal NECK: No JVD; No carotid bruits LYMPHATICS: No lymphadenopathy CARDIAC:RRR, no murmurs, rubs, gallops RESPIRATORY:  Clear to auscultation without rales, wheezing or rhonchi  ABDOMEN: Soft, non-tender, non-distended MUSCULOSKELETAL:  No edema; No deformity  SKIN: Warm and dry NEUROLOGIC:  Alert and oriented x 3 PSYCHIATRIC:   Normal affect  ASSESSMENT & PLAN:    1.  OSA  -she has severe OSA intolerant to PAP therapy -she is now s/p Hypoglossal nerve stimulator implant by Dr. Redmond Baseman -her device was activated several months ago with the following programming:              -Sensation threshold 0.5V             -Functional threshold 0.6V -programmed with the following parameters:             -Pulse width 8m             -rate '33Hz'$              -Start delay 369m             -Pause time 1570m            -Therapy duration 8hr             -Electrodes +-+             -Stimulation settings 0.6-1.6V -she was doing well after 4 weeks of using her device -she had significant improvement in her daytime sleepiness and is not snoring -she underwent in lab titration 01/09/2021 with a therapeutic amplitude of 1.4V with an AHI of 7.5/hr and O2 sats as low as 88%.  Her device was programmed to amplitude of 1.3V and she is now up to 1.5 V using it and doing very well without any problems at all and no excessive daytime sleepiness.  She says she feels the best she has felt in a long time -Sleep lab data was reviewed and showed an AHI 1.6/h at 1.3 V but jumped to 8/h at 1.5 V.  She feels good though at 1.5 V. -Her device was interrogated and she is 96% compliant  using her device greater than 4 hours and average use of 7.9 hours. -Device changes made at last office visit were as follows: Her therapeutic range was changed to an amplitude of 1.3 to 1.8 V. -She is doing well and is currently on level 3 which is 1.5 V.  She is sleeping well with no tongue sensitivity and feeling rested in the morning -No changes made today -We will see her back in 6 months with a WatchPAT home sleep study 1 week prior to her office visit   2.  PAF -s/p ablation -She remains in normal sinus rhythm on exam and denies palpitations -She is currently not on any medications for her A-fib  Medication Adjustments/Labs and Tests Ordered: Current medicines  are reviewed at length with the patient today.  Concerns regarding medicines are outlined above.  No orders of the defined types were placed in this encounter.  No orders of the defined types were placed in this encounter.   Medication Adjustments/Labs and Tests Ordered: Current medicines are reviewed at length with the patient today.  Concerns regarding medicines are outlined above.   Tests Ordered: No orders of the defined types were placed in this encounter.   Medication Changes: No orders of the defined types were placed in this encounter.   Follow Up:  After Inspire titration  Signed, [name], MD  11/06/2021 1:49 PM    [facility]

## 2021-11-07 NOTE — Progress Notes (Signed)
Subjective:   Patient ID: Brittany [name], female   DOB: 76 y.o.   MRN: [id number]   HPI Patient states it does not seem to be getting worse but she wanted it checked again and she continues to hold the toe in proper position and it does reduce in shoes   ROS      Objective:  Physical Exam  Neurovascular status intact mild varus deformity right which is not getting worse and is reducible with no pain when I move the joint back-and-forth excellent healing of the incision site     Assessment:  Overall doing well with good range of motion mild varus deformity but does not appear to be getting worse     Plan:  H&P x-ray reviewed and went ahead and advised on continuing to hold this in a valgus position and as long as it does not get worse it should be uneventful and healing.  Patient was made aware that if it gets worse at 1 point in future may require secondary correction or fusion  X-rays indicate mild varus deformity right good healing osteotomy fixation in place

## 2021-11-10 ENCOUNTER — Telehealth (INDEPENDENT_AMBULATORY_CARE_PROVIDER_SITE_OTHER): Payer: PPO | Admitting: Family Medicine

## 2021-11-10 ENCOUNTER — Telehealth: Payer: Self-pay | Admitting: Nurse Practitioner

## 2021-11-10 ENCOUNTER — Encounter: Payer: Self-pay | Admitting: Family Medicine

## 2021-11-10 DIAGNOSIS — G4700 Insomnia, unspecified: Secondary | ICD-10-CM | POA: Diagnosis not present

## 2021-11-10 DIAGNOSIS — U071 COVID-19: Secondary | ICD-10-CM

## 2021-11-10 MED ORDER — NIRMATRELVIR/RITONAVIR (PAXLOVID)TABLET: 3.0000 | ORAL_TABLET | Freq: Two times a day (BID) | ORAL | 0 refills | Status: AC

## 2021-11-10 MED ORDER — BENZONATATE 100 MG PO CAPS: 100.0000 mg | ORAL_CAPSULE | Freq: Two times a day (BID) | ORAL | 0 refills | Status: DC | PRN

## 2021-11-10 NOTE — Telephone Encounter (Signed)
Pt called this morning to inform MD that she has tested positive for Covid.  Pt would like to know if MD could send something to treat symptoms to the pharmacy or does MD want Pt to have a VV?  LOV:  10/27/21 = TOC  Please advise.  [facility] [address] [place] Phone:  [phone]  Fax:  [phone]

## 2021-11-10 NOTE — Progress Notes (Signed)
Phone [phone] Virtual visit via Video note   Subjective:  Chief complaint: Chief Complaint  Patient presents with   Covid Positive    Patient states the home Covid test was positive this morning   Cough    Non-productive x1 day, states she has not tried any OTC medication   Sore Throat    X2 days   Fatigue    Noted since morning   This visit type was conducted due to national recommendations for restrictions regarding the COVID-19 Pandemic (e.g. social distancing).  This format is felt to be most appropriate for this patient at this time balancing risks to patient and risks to population by having him in for in person visit.  No physical exam was performed (except for noted visual exam or audio findings with Telehealth visits).    Our team/I connected with Brittany [name] at  4:00 PM EDT by a video enabled telemedicine application ([email or web address] or caregility through epic) and verified that I am speaking with the correct person using two identifiers.  Location patient: Home[place] Location provider: [facility], office Persons participating in the virtual visit:  patient  Our team/I discussed the limitations of evaluation and management by telemedicine and the availability of in person appointments. In light of current covid-19 pandemic, patient also understands that we are trying to protect them by minimizing in office contact if at all possible.  The patient expressed consent for telemedicine visit and agreed to proceed. Patient understands insurance will be billed.   Past Medical History-  Patient Active Problem List   Diagnosis Date Noted   OSA (obstructive sleep apnea) 01/09/2021   Status post total left knee replacement 10/11/2020   Primary osteoarthritis of left knee 10/02/2020   Atrial fibrillation ([place]) 05/13/2018   Bloating 04/11/2018   Abnormal EKG 08/23/2017   Malignant neoplasm of overlapping sites of right breast in female, estrogen receptor positive ([place]) 08/17/2016   Hx  of colonic polyps 08/11/2016   Iron deficiency anemia 07/28/2016   Blood donor 07/28/2016   Pain due to total right knee replacement ([place]) 04/06/2016   S/P right TKA 12/31/2015   Baker's cyst, unruptured 09/27/2015   Insomnia 07/31/2015   Onychomycosis 09/25/2014   Urinary frequency 05/21/2014   Mitral valve prolapse 05/08/2014   Mitral regurgitation 05/08/2014   Dyslipidemia 02/09/2014   History of radiation therapy    Hiatal hernia    History of chemotherapy    Osteoporosis 03/21/2012   Seasonal and perennial allergic rhinitis 12/17/2011   B12 deficiency 10/09/2009   TOBACCO USE, QUIT 01/31/2009   THYROID NODULE 12/25/2008   GERD 11/23/2007   VARICOSE VEIN 12/29/2006   Anxiety state 12/23/2006   Depression 12/23/2006   Migraine variant 12/23/2006   Disorder of bone and cartilage 12/23/2006    Medications- reviewed and updated Current Outpatient Medications  Medication Sig Dispense Refill   acetaminophen (TYLENOL) 500 MG tablet Take 500 mg by mouth every 6 (six) hours as needed.     benzonatate (TESSALON) 100 MG capsule Take 1 capsule (100 mg total) by mouth 2 (two) times daily as needed for cough. 20 capsule 0   Biotin 5000 MCG TABS Take 5,000 mcg by mouth daily.     Calcium-Magnesium-Zinc (CAL-MAG-ZINC PO) Take 1 tablet by mouth daily. 600 mg     Cholecalciferol (VITAMIN D) 50 MCG (2000 UT) tablet Take 2,000 Units by mouth daily.     citalopram (CELEXA) 40 MG tablet Take 1 tablet (40 mg total) by mouth daily. [place]  tablet 3   Cyanocobalamin (VITAMIN B-12 PO) Take 2,500 mcg by mouth daily.     denosumab (PROLIA) 60 MG/ML SOSY injection Inject 60 mg into the skin every 6 (six) months.     diclofenac Sodium (VOLTAREN) 1 % GEL Apply 2 g topically 4 (four) times daily.     diphenhydrAMINE (BENADRYL) 25 MG tablet Take 25 mg by mouth every 6 (six) hours as needed for allergies.     famotidine (PEPCID) 40 MG tablet Take 1 tablet (40 mg total) by mouth daily. 90 tablet 3    fluticasone (FLONASE) 50 MCG/ACT nasal spray SPRAY 2 SPRAYS INTO EACH NOSTRIL EVERY DAY 48 mL 0   nirmatrelvir/ritonavir EUA (PAXLOVID) 20 x 150 MG & 10 x '100MG'$  TABS Take 3 tablets by mouth 2 (two) times daily for 5 days. (Take nirmatrelvir 150 mg two tablets twice daily for 5 days and ritonavir 100 mg one tablet twice daily for 5 days) Patient GFR is >60 30 tablet 0   Omega-3 Fatty Acids (FISH OIL) 1200 MG CAPS Take 1,200 mg by mouth daily.     Probiotic Product (PROBIOTIC DAILY PO) Take 1 tablet by mouth daily.     SUMAtriptan (IMITREX) 100 MG tablet Take 1 tablet (100 mg total) by mouth once for 1 dose. Can repeat dose in 2 hours if symptoms persist. Max dose 2 tablets in 24 hours. 9 tablet 5   zolpidem (AMBIEN) 5 MG tablet Take 0.5-1 tablets (2.5-5 mg total) by mouth at bedtime as needed for sleep. 90 tablet 0   No current facility-administered medications for this visit.     Objective:  LMP 03/02/1994 (Approximate)  self reported vitals Gen: NAD, resting comfortably Lungs: nonlabored, normal respiratory rate - no coughing during our visit Skin: appears dry, no obvious rash     Assessment and Plan   # Covid 19 S:nonproductive dry hacking cough x 1 day. No meds yet tried at home. Also sore throat x 2 days ago (noted first Saturday night but later resolved) and fatigue this AM (very atypical for her). Home covid test positive this AM. No shortness of breath or significant chest pain- some chest Majkowski pain with cough. No fever A/P:  Patient with testing confirming covid 19 with first day of covid 19 symptoms September 9th (day )  Vaccination status: most recent bivalent covid vaccine 02/15/21. Had 4 injections prior to that  Therefore: - recommended patient watch closely for shortness of breath or confusion or worsening symptoms and if those occur patient should contact us immediately or seek care in the emergency department -recommended patient consider purchasing pulse oximeter and if  levels 94% or below persistently- seek care at the hospital - Patient needs to self isolate  for at least 5 days since first symptom AND at least 24 hours fever free without fever reducing medications AND have improvement in respiratory symptoms . After 5 days can end self isolation but still needs to wear mask for additional 5 days  -Patient should inform close contacts about exposure (anyone patient been around unmasked for more than 15 minutes)   If High risk for complications(age >54)-SF discussed outpatient therapeutic options including paxlovid, molnupiravir - patient opted for paxlovid -also sent in tessalon for cough- if ineffective could try plain delsym  # Insomnia S: takes 2.5 mg of ambien most nights- and even with this has had poor sleep with covid.   A/P: mild poor control with illness. We discussed risks of paxlovid increasing concentration of ambien  but since she is only taking 2.5 mg we thought risk was overall low and she preferred this over molnupiravir  Recommended follow up: as needed if new or worsening symptoms or failure to improve Future Appointments  Date Time Provider [place]  11/20/2021 12:30 PM [facility] None  11/20/2021  1:00 PM [place] None  11/20/2021  1:45 PM Nicholas Lose, MD [facility] None  03/09/2022  1:00 PM [place] 2 [facility]  04/29/2022 10:30 AM Farrel Conners, MD [facility]  05/18/2022  9:40 AM [name], MD [facility]  10/05/2022  1:00 PM [facility] PHARMACIST [facility]  10/09/2022 10:15 AM [name], MD [facility] None    Lab/Order associations:   ICD-10-CM   1. COVID-19  U07.1     2. Insomnia, unspecified type  G47.00      Meds ordered this encounter  Medications   nirmatrelvir/ritonavir EUA (PAXLOVID) 20 x 150 MG & 10 x '100MG'$  TABS    Sig: Take 3 tablets by mouth 2 (two) times daily for 5 days. (Take nirmatrelvir 150 mg two tablets twice daily  for 5 days and ritonavir 100 mg one tablet twice daily for 5 days) Patient GFR is >60    Dispense:  30 tablet    Refill:  0   benzonatate (TESSALON) 100 MG capsule    Sig: Take 1 capsule (100 mg total) by mouth 2 (two) times daily as needed for cough.    Dispense:  20 capsule    Refill:  0    Return precautions advised.  Garret Reddish, MD

## 2021-11-10 NOTE — Telephone Encounter (Signed)
Spoke with the patient, informed her a visit is needed and this was scheduled with Dr Yong Channel today at [employer] [facility].

## 2021-11-11 ENCOUNTER — Other Ambulatory Visit: Payer: Self-pay | Admitting: *Deleted

## 2021-11-11 DIAGNOSIS — E785 Hyperlipidemia, unspecified: Secondary | ICD-10-CM

## 2021-11-18 ENCOUNTER — Telehealth: Payer: Self-pay

## 2021-11-18 NOTE — Telephone Encounter (Signed)
Pt called and states she tested positive for COVID 11/10/21. Per [place], pts are not to come into the [facility] until 21 days post dx. Pt's lab, MD visit and injection r/s to 12/01/21. She is aware of schedule change and times. She will call for any further questions or concerns.

## 2021-11-20 ENCOUNTER — Ambulatory Visit: Payer: PPO | Admitting: Hematology and Oncology

## 2021-11-20 ENCOUNTER — Ambulatory Visit: Payer: PPO

## 2021-11-20 ENCOUNTER — Other Ambulatory Visit: Payer: PPO

## 2021-12-01 ENCOUNTER — Other Ambulatory Visit: Payer: PPO

## 2021-12-01 ENCOUNTER — Ambulatory Visit: Payer: PPO

## 2021-12-01 ENCOUNTER — Ambulatory Visit: Payer: PPO | Admitting: Hematology and Oncology

## 2021-12-02 NOTE — Progress Notes (Signed)
Patient Care Team: Farrel Conners, MD as PCP - General (Family Medicine) [name], MD as PCP - Cardiology (Cardiology) [name], MD as PCP - Electrophysiology (Cardiology) [name], MD as PCP - Sleep Medicine (Cardiology) [name], [name], MD (Inactive) as Consulting Physician (Hematology and Oncology) [name], MD as Consulting Physician (Sleep Medicine) [name], [facility] as Pharmacist (Pharmacist) [name], MD as Attending Physician (Orthopedic Surgery)  DIAGNOSIS:  Encounter Diagnosis  Name Primary?   Malignant neoplasm of overlapping sites of right breast in female, estrogen receptor positive ([place])     SUMMARY OF ONCOLOGIC HISTORY: Oncology History  Breast cancer, right breast ([place]) (Resolved)  11/22/2007 Breast US   Right breast with irregular, hypoechoic mass measuring up to 1.9 cm. Coorelated with palpable finding.  LNs appeared normal.     11/22/2007 Mammogram   Right breast with suspicious 1.5 cm ill-defined density with associated microcalcs.   11/23/2007 Initial Biopsy   Right breast biopsy revealed grade 2-3 IDC. ER- (0%)/PR- (0%)/HER2 neg.  Ki76 50%.    11/23/2007 Initial Diagnosis   T1cN0, Stage IA right breast invasive ductal carcinoma   12/08/2007 Breast MRI   Solitary abnormally enhancing right breast mass at 8 o'clock location, corresponding with bx-proven IDC. No MRI evidence of multifocal/multicentric or contralateral disease.    12/15/2007 Echocardiogram   Pre-treatment EF: 60%.    12/27/2007 -  Neo-Adjuvant Chemotherapy   Docetaxel/Gemcitabine/Bevacizumab x 4 cycles,  then Doxorubicin/Cytoxan x 4 cycles with Bevacizumab x 2 cycles per NSABP Protocol B-40.  Pt received last Bevacizumab dose in 03/2008.   06/12/2008 Echocardiogram   EF normal: 65-70%   06/26/2008 Definitive Surgery   Right lumpectomy with right axillary SLNB revealed no residual carcinoma and complete pathologic response.  Margins neg.  0/2  SLN positive for metastatic carcinoma.    06/26/2008 Pathologic Stage   ypT0 ypN0(i-): No evidence of disease.     - 09/11/2008 Radiation Therapy   Completed right breast radiation therapy. Total dose 61 Gy.  Completed 08/2008.   06/13/2009 Echocardiogram   EF normal: 55-60%.    12/2011 Miscellaneous   Began osteoporosis treatment with Zoledronate.  Completed therapy in 10/2012.    05/26/2013 Imaging   DEXA scan: T-score -2.3, low bone mass. Improvement in BMD of lumbar spine since last scan 05/2011.  Recs to repeat in 2 years.    11/30/2013 Miscellaneous   Began osteoporosis treatment with Prolia/Denosumab.  Receives infusions every 6 months.    02/09/2014 Echocardiogram   Echocardiographic Stress Test: negative with recommendations to repeat in 1 year per cardiology specialists.    06/29/2014 Survivorship   Survivorship Care Plan mailed to patient at her request in lieu of an in-person meeting in [facility].      CHIEF COMPLIANT: Follow-up right breast cancer surveillance Establish oncology care with Dr. Lindi Adie   INTERVAL HISTORY: IRAM ASTORINO is a 76 y.o. with the above-mentioned breast cancer surveillance. She presents to the clinic for a follow-up. And Establish oncology care with Dr. Lindi Adie. She reports to the clinic today that she has been doing fine. She had two knee replacements. Denies pain     ALLERGIES:  is allergic to boniva [ibandronic acid], hydrocodone-acetaminophen, lipitor [atorvastatin], and tramadol.  MEDICATIONS:  Current Outpatient Medications  Medication Sig Dispense Refill   acetaminophen (TYLENOL) 500 MG tablet Take 500 mg by mouth every 6 (six) hours as needed.     benzonatate (TESSALON) 100 MG capsule Take 1  capsule (100 mg total) by mouth 2 (two) times daily as needed for cough. 20 capsule 0   Biotin 5000 MCG TABS Take 5,000 mcg by mouth daily.     Calcium-Magnesium-Zinc (CAL-MAG-ZINC PO) Take 1 tablet by mouth daily. 600 mg     Cholecalciferol  (VITAMIN D) 50 MCG (2000 UT) tablet Take 2,000 Units by mouth daily.     citalopram (CELEXA) 40 MG tablet Take 1 tablet (40 mg total) by mouth daily. 90 tablet 3   Cyanocobalamin (VITAMIN B-12 PO) Take 2,500 mcg by mouth daily.     denosumab (PROLIA) 60 MG/ML SOSY injection Inject 60 mg into the skin every 6 (six) months.     diclofenac Sodium (VOLTAREN) 1 % GEL Apply 2 g topically 4 (four) times daily.     diphenhydrAMINE (BENADRYL) 25 MG tablet Take 25 mg by mouth every 6 (six) hours as needed for allergies.     famotidine (PEPCID) 40 MG tablet Take 1 tablet (40 mg total) by mouth daily. 90 tablet 3   fluticasone (FLONASE) 50 MCG/ACT nasal spray SPRAY 2 SPRAYS INTO EACH NOSTRIL EVERY DAY 48 mL 0   Omega-3 Fatty Acids (FISH OIL) 1200 MG CAPS Take 1,200 mg by mouth daily.     Probiotic Product (PROBIOTIC DAILY PO) Take 1 tablet by mouth daily.     SUMAtriptan (IMITREX) 100 MG tablet Take 1 tablet (100 mg total) by mouth once for 1 dose. Can repeat dose in 2 hours if symptoms persist. Max dose 2 tablets in 24 hours. 9 tablet 5   zolpidem (AMBIEN) 5 MG tablet Take 0.5-1 tablets (2.5-5 mg total) by mouth at bedtime as needed for sleep. 90 tablet 0   No current facility-administered medications for this visit.    PHYSICAL EXAMINATION: ECOG PERFORMANCE STATUS: 1 - Symptomatic but completely ambulatory  Vitals:   12/05/21 1125  BP: 125/61  Pulse: 79  Resp: 18  Temp: (!) 97.2 F (36.2 C)  SpO2: 99%   Filed Weights   12/05/21 1125  Weight: 128 lb 14.4 oz (58.5 kg)    BREAST: No palpable masses or nodules in either right or left breasts. No palpable axillary supraclavicular or infraclavicular adenopathy no breast tenderness or nipple discharge. (exam performed in the presence of a chaperone)  LABORATORY DATA:  I have reviewed the data as listed    Latest Ref Rng & Units 11/06/2021    9:46 AM 05/20/2021   12:09 PM 11/20/2020    1:18 PM  CMP  Glucose 70 - 99 mg/dL 94  90  144   BUN 8 -  27 mg/dL _0 Creatinine 0.57 - 1.00 mg/dL 0.68  0.67  0.74   Sodium 134 - 144 mmol/L 138  138  138   Potassium 3.5 - 5.2 mmol/L 4.1  3.7  3.8   Chloride 96 - 106 mmol/L 101  102  105   CO2 20 - 29 mmol/L _1 Calcium 8.7 - 10.3 mg/dL 9.6  9.6  8.9   Total Protein 6.0 - 8.5 g/dL 7.3  7.0  6.8   Total Bilirubin 0.0 - 1.2 mg/dL 0.4  0.4  0.3   Alkaline Phos 44 - 121 IU/L 54  50  72   AST 0 - 40 IU/L _2 ALT 0 - 32 IU/L _3 Lab Results  Component Value Date  WBC 6.2 12/05/2021   HGB 13.6 12/05/2021   HCT 39.8 12/05/2021   MCV 94.1 12/05/2021   PLT 296 12/05/2021   NEUTROABS 4.1 12/05/2021    ASSESSMENT & PLAN:  Malignant neoplasm of overlapping sites of right breast in female, estrogen receptor positive [facility]) Dr. Jana Hakim patient establishing oncology care 11/2007: Clinical T1CN0 stage Ia grade 2-3 IDC triple negative Ki-67 50% January 2010: Completed neoadjuvant chemotherapy with Taxotere gemcitabine bevacizumab followed by Adriamycin Cytoxan bevacizumab NSABP B40 April 2010: Right lumpectomy: Complete pathologic response July 2010: Completed adjuvant radiation  Breast cancer surveillance: 1.  Breast exam 12/05/2021: Benign 2. mammogram 02/18/2021: At [facility] benign breast density category C  Patient wishes to follow-up once a year indefinitely.  No orders of the defined types were placed in this encounter.  The patient has a good understanding of the overall plan. she agrees with it. she will call with any problems that may develop before the next visit here. Total time spent: 30 mins including face to face time and time spent for planning, charting and co-ordination of care   Harriette Ohara, MD 12/05/21    I Gardiner Coins am scribing for Dr. Lindi Adie  I have reviewed the above documentation for accuracy and completeness, and I agree with the above.

## 2021-12-04 NOTE — Assessment & Plan Note (Addendum)
Dr. Jana Hakim patient establishing oncology care 11/2007: Clinical T1CN0 stage Ia grade 2-3 IDC triple negative Ki-67 50% January 2010: Completed neoadjuvant chemotherapy with Taxotere gemcitabine bevacizumab followed by Adriamycin Cytoxan bevacizumab NSABP B40 April 2010: Right lumpectomy: Complete pathologic response July 2010: Completed adjuvant radiation  Breast cancer surveillance: 1.  Breast exam 12/05/2021: Benign 2. mammogram 02/18/2021: At [facility] benign breast density category C  Patient wishes to follow-up once a year Return to clinic in 1 year for follow-up

## 2021-12-05 ENCOUNTER — Inpatient Hospital Stay (HOSPITAL_BASED_OUTPATIENT_CLINIC_OR_DEPARTMENT_OTHER): Payer: PPO | Admitting: Hematology and Oncology

## 2021-12-05 ENCOUNTER — Inpatient Hospital Stay: Payer: PPO | Attending: Hematology and Oncology

## 2021-12-05 ENCOUNTER — Inpatient Hospital Stay: Payer: PPO

## 2021-12-05 DIAGNOSIS — Z888 Allergy status to other drugs, medicaments and biological substances status: Secondary | ICD-10-CM | POA: Insufficient documentation

## 2021-12-05 DIAGNOSIS — Z79899 Other long term (current) drug therapy: Secondary | ICD-10-CM | POA: Insufficient documentation

## 2021-12-05 DIAGNOSIS — Z923 Personal history of irradiation: Secondary | ICD-10-CM | POA: Diagnosis not present

## 2021-12-05 DIAGNOSIS — Z885 Allergy status to narcotic agent status: Secondary | ICD-10-CM | POA: Diagnosis not present

## 2021-12-05 DIAGNOSIS — C50811 Malignant neoplasm of overlapping sites of right female breast: Secondary | ICD-10-CM

## 2021-12-05 DIAGNOSIS — Z170 Estrogen receptor positive status [ER+]: Secondary | ICD-10-CM | POA: Diagnosis not present

## 2021-12-05 DIAGNOSIS — M810 Age-related osteoporosis without current pathological fracture: Secondary | ICD-10-CM

## 2021-12-05 LAB — CMP (CANCER CENTER ONLY)
ALT: 10 U/L (ref 0–44)
AST: 15 U/L (ref 15–41)
Albumin: 4.3 g/dL (ref 3.5–5.0)
Alkaline Phosphatase: 63 U/L (ref 38–126)
Anion gap: 3 — ABNORMAL LOW (ref 5–15)
BUN: 10 mg/dL (ref 8–23)
CO2: 32 mmol/L (ref 22–32)
Calcium: 9.5 mg/dL (ref 8.9–10.3)
Chloride: 101 mmol/L (ref 98–111)
Creatinine: 0.65 mg/dL (ref 0.44–1.00)
GFR, Estimated: >60 mL/min (ref >60)
Glucose, Bld: 99 mg/dL (ref 70–99)
Potassium: 4.4 mmol/L (ref 3.5–5.1)
Sodium: 136 mmol/L (ref 135–145)
Total Bilirubin: 0.4 mg/dL (ref 0.3–1.2)
Total Protein: 7.5 g/dL (ref 6.5–8.1)

## 2021-12-05 LAB — CBC WITH DIFFERENTIAL (CANCER CENTER ONLY)
Abs Immature Granulocytes: 0.02 10*3/uL (ref 0.00–0.07)
Basophils Absolute: 0.1 10*3/uL (ref 0.0–0.1)
Basophils Relative: 1 %
Eosinophils Absolute: 0.2 10*3/uL (ref 0.0–0.5)
Eosinophils Relative: 3 %
HCT: 39.8 % (ref 36.0–46.0)
Hemoglobin: 13.6 g/dL (ref 12.0–15.0)
Immature Granulocytes: 0 %
Lymphocytes Relative: 22 %
Lymphs Abs: 1.4 10*3/uL (ref 0.7–4.0)
MCH: 32.2 pg (ref 26.0–34.0)
MCHC: 34.2 g/dL (ref 30.0–36.0)
MCV: 94.1 fL (ref 80.0–100.0)
Monocytes Absolute: 0.4 10*3/uL (ref 0.1–1.0)
Monocytes Relative: 7 %
Neutro Abs: 4.1 10*3/uL (ref 1.7–7.7)
Neutrophils Relative %: 67 %
Platelet Count: 296 10*3/uL (ref 150–400)
RBC: 4.23 MIL/uL (ref 3.87–5.11)
RDW: 13.2 % (ref 11.5–15.5)
WBC Count: 6.2 10*3/uL (ref 4.0–10.5)
nRBC: 0 % (ref 0.0–0.2)

## 2021-12-05 MED ORDER — DENOSUMAB 60 MG/ML ~~LOC~~ SOSY
60.0000 mg | PREFILLED_SYRINGE | Freq: Once | SUBCUTANEOUS | Status: AC
  Administered 2021-12-05: 60 mg via SUBCUTANEOUS
  Filled 2021-12-05: qty 1

## 2021-12-15 ENCOUNTER — Ambulatory Visit: Payer: PPO | Attending: Cardiovascular Disease | Admitting: Pharmacist

## 2021-12-15 DIAGNOSIS — E785 Hyperlipidemia, unspecified: Secondary | ICD-10-CM

## 2021-12-15 MED ORDER — ROSUVASTATIN CALCIUM 5 MG PO TABS: 5.0000 mg | ORAL_TABLET | Freq: Every day | ORAL | 3 refills | Status: DC

## 2021-12-15 NOTE — Patient Instructions (Addendum)
Your LDL ("bad" cholesterol) is 183. Out goal is <100.  Start Crestor (rosuvastatin) 5 mg every Mon, Wed, Fri. If you feel okay after 2 weeks, then increase to daily.  Will check cholesterol at your visit on 03/09/22 @ 10:30. Please arrive fasting.

## 2021-12-15 NOTE — Progress Notes (Unsigned)
Patient ID: REMEDY CORPORAN                 DOB: 03/08/1945                    MRN: [id number]     HPI: Brittany [name] is a 76 y.o. female patient referred to lipid clinic by Brittany [name]; Brittany [name] is there primary cardiologist. PMH is significant for OSA on CPAP/Inspire device implant, atrial fibrillation s/p ablation 10/2018, breast cancer s/p ectomy, chemo, and XRT. EF via [place] in 2021 was 56%. Patient had underwent afib ablation in 2020, so Eliquis was discontinued.   Patient was last seen by Brittany [name] on 10/30/21 for atrial fibrillation management. At that visit, her LDL in 2022 was noted to be 158 with a coronary artery calcium score of 0 in 04/2021. A new lipid panel was taken at that appointment revealing an LDL of 183 on no antilipid medications. Brittany [name] recommended starting Crestor 10 mg, however the patient was resistant to initiating a new statin given intolerance to atorvastatin due to myalgias. Pt weas referred to lipid clinic.  Pt presents today in good spirits. She reports feeling well today with no complaints of chest pain, palpitations, or dizziness. She notes no issues with atrial fibrillation since her ablation. She recounted her history of cholesterol medications including myalgias to atorvastatin 10 mg daily. She stopped pravastatin 20 mg daily out of fear after seeing her husband experience severe myalgias that interfered with his ability to perform daily functions. Her husband now takes Repatha and tolerates it very well. She reports a relatively healthy diet high in vegetables, but exercises little due to her knee and toe surgeries causing imbalance. She reports no family history of MI or stroke.  Current Medications:  Intolerances: atorvastatin 10 mg (myalgias), pravastatin 20 mg (?), Lovaza 1g daily (?) Risk Factors: none LDL goal: <100  Diet:  Does not eat breakfast Lunch: Everything bagel thin yogurt Dinner: Pasta with vegetable 2 oreos per  day  Pt reports her husband no longer eats meat, so she predominantly eats vegetables at home now.  Exercise:  Pt has a difficult time with exercise since knee and toe surgeries d/t pain and balance respectively. Does not currently exercise, but previously walked often.  Family History:  The patient's family history includes Breast cancer (age of onset: 71) in her sister; Cancer in her father; Colon cancer (age of onset: 58) in her sister; Dementia in her mother; Epilepsy in her mother; Heart disease in her father; Hip fracture in her mother; Other in her brother and maternal grandfather. There is no history of Esophageal cancer or Stomach cancer  Social History:  Social History   Socioeconomic History   Marital status: Married    Spouse name: Not on file   Number of children: 3   Years of education: Not on file   Highest education level: Associate degree: academic program  Occupational History    Employer: RETIRED  Tobacco Use   Smoking status: Former    Packs/day: 0.25    Years: 5.00    Total pack years: 1.25    Types: Cigarettes    Quit date: 03/03/1967    Years since quitting: 54.8   Smokeless tobacco: Never   Tobacco comments:    6 cig per day when she was smoking/quit years ago  Vaping Use   Vaping Use: Never used  Substance and Sexual Activity   Alcohol use: Yes  Alcohol/week: 7.0 standard drinks of alcohol    Types: 7 Cans of beer per week    Comment: 1 beer per day - 2.4 % alcholol - [employer] 55   Drug use: No   Sexual activity: Yes    Partners: Male    Birth control/protection: Post-menopausal  Other Topics Concern   Not on file  Social History Narrative   G3, P3, 1st pregnancy age 42, menopause age 50, no HRT   Social Determinants of Health   Financial Resource Strain: Low Risk  (04/19/2021)   Overall Financial Resource Strain (CARDIA)    Difficulty of Paying Living Expenses: Not hard at all  Food Insecurity: No Food Insecurity (04/19/2021)   Hunger  Vital Sign    Worried About Running Out of Food in the Last Year: Never true    Ran Out of Food in the Last Year: Never true  Transportation Needs: No Transportation Needs (04/19/2021)   PRAPARE - Hydrologist (Medical): No    Lack of Transportation (Non-Medical): No  Physical Activity: Sufficiently Active (04/19/2021)   Exercise Vital Sign    Days of Exercise per Week: 3 days    Minutes of Exercise per Session: 60 min  Stress: No Stress Concern Present (04/19/2021)   [facility]    Feeling of Stress : Not at all  Social Connections: [place] (04/19/2021)   Social Connection and Isolation Panel [NHANES]    Frequency of Communication with Friends and Family: More than three times a week    Frequency of Social Gatherings with Friends and Family: Three times a week    Attends Religious Services: More than 4 times per year    Active Member of Clubs or Organizations: Yes    Attends Archivist Meetings: More than 4 times per year    Marital Status: Married  Human resources officer Violence: Not At Risk (02/20/2020)   Humiliation, Afraid, Rape, and Kick questionnaire    Fear of Current or Ex-Partner: No    Emotionally Abused: No    Physically Abused: No    Sexually Abused: No    Labs:  Lipid panel 11/06/2021 on no treatment: LDL 183, HDL 64, TC 258, TG 69  Past Medical History:  Diagnosis Date   A-fib ([place])    Acid reflux    Acute cystitis with positive culture 03/06/2014   Anemia    Anxiety    Arthritis    Baker cyst, left 04/06/2016   Aspirated 04/06/2016   Breast cancer ([place]) 11/23/2007   right, ER/PR -, HER 2 -   Cognitive deficits 09/25/2014   2016 Very mild, could be related to meds    Degenerative arthritis of left knee 04/06/2016   Injection and aspiration 04/06/2016   Degenerative arthritis of right knee 09/27/2015   Depression    Esophageal stricture     Gastritis 10/2007   History of chemotherapy    neoadjuvant chemo, last dose 03/2008   History of radiation therapy 07/25/08 -09/11/08   right breast   Hx of colonic polyps 08/11/2016   Hyperlipidemia    Migraines    rare   Myalgia 07/18/2013   5/15 multiple site    OSA (obstructive sleep apnea)    severe OSA AHI 34, intolerant to PAP and now s/p hypoglossal nerve stimulator implant   Osteopenia    Rupture of quadriceps tendon 03/23/2017    Current Outpatient Medications on File Prior to  Visit  Medication Sig Dispense Refill   acetaminophen (TYLENOL) 500 MG tablet Take 500 mg by mouth every 6 (six) hours as needed.     benzonatate (TESSALON) 100 MG capsule Take 1 capsule (100 mg total) by mouth 2 (two) times daily as needed for cough. 20 capsule 0   Biotin 5000 MCG TABS Take 5,000 mcg by mouth daily.     Calcium-Magnesium-Zinc (CAL-MAG-ZINC PO) Take 1 tablet by mouth daily. 600 mg     Cholecalciferol (VITAMIN D) 50 MCG (2000 UT) tablet Take 2,000 Units by mouth daily.     citalopram (CELEXA) 40 MG tablet Take 1 tablet (40 mg total) by mouth daily. 90 tablet 3   Cyanocobalamin (VITAMIN B-12 PO) Take 2,500 mcg by mouth daily.     denosumab (PROLIA) 60 MG/ML SOSY injection Inject 60 mg into the skin every 6 (six) months.     diclofenac Sodium (VOLTAREN) 1 % GEL Apply 2 g topically 4 (four) times daily.     diphenhydrAMINE (BENADRYL) 25 MG tablet Take 25 mg by mouth every 6 (six) hours as needed for allergies.     famotidine (PEPCID) 40 MG tablet Take 1 tablet (40 mg total) by mouth daily. 90 tablet 3   fluticasone (FLONASE) 50 MCG/ACT nasal spray SPRAY 2 SPRAYS INTO EACH NOSTRIL EVERY DAY 48 mL 0   Omega-3 Fatty Acids (FISH OIL) 1200 MG CAPS Take 1,200 mg by mouth daily.     Probiotic Product (PROBIOTIC DAILY PO) Take 1 tablet by mouth daily.     SUMAtriptan (IMITREX) 100 MG tablet Take 1 tablet (100 mg total) by mouth once for 1 dose. Can repeat dose in 2 hours if symptoms persist. Max  dose 2 tablets in 24 hours. 9 tablet 5   zolpidem (AMBIEN) 5 MG tablet Take 0.5-1 tablets (2.5-5 mg total) by mouth at bedtime as needed for sleep. 90 tablet 0   No current facility-administered medications on file prior to visit.    Allergies  Allergen Reactions   Boniva [Ibandronic Acid] Nausea Only   Hydrocodone-Acetaminophen Other (See Comments)    "Does not relieve pain", patient reported.  Can take Tylenol.   Lipitor [Atorvastatin] Other (See Comments)    Muscle and joint pain(s)   Pt request removal of this intolerance   Tramadol Other (See Comments)    "Does not help pain."- patient reported.    Assessment/Plan:  1. Hyperlipidemia - LDL of 183 is above goal of <100 given her coronary artery disease and comorbidity. The patient was educated on the benefits of statin therapy. Alternative options including Zetia and Repatha were explained as well. After thorough education, the patient was eventually willing to trial Crestor at 5 mg every MWF and eventually increase to daily after 2 weeks if well tolerated. She is anxious about starting a statin, however was reassured that if adverse effects occur she can call the clinic and we can transition her to an alternative therapy including Zetia or Repatha. She will likely require Crestor and Zetia to get to goal, or alternatively Repatha monotherapy if intolerant to Crestor. Pt was encouraged to strive for 150 minutes of moderate intensity exercise per week, with alternative forms of stationary exercise if balance while walking proves an issue. Given the 2 month time falling around the holiday, the patient would prefer a 3 month check in early January. Will see patient again for lipid management on 03/09/22. Will obtain labs at that time.  Thank you for allowing pharmacy to participate in  this patient's care.  Reatha Harps, PharmD PGY2 Pharmacy Resident 12/15/2021 4:28 PM

## 2021-12-17 ENCOUNTER — Encounter: Payer: Self-pay | Admitting: Podiatry

## 2021-12-17 ENCOUNTER — Ambulatory Visit (INDEPENDENT_AMBULATORY_CARE_PROVIDER_SITE_OTHER): Payer: PPO | Admitting: Podiatry

## 2021-12-17 ENCOUNTER — Ambulatory Visit (INDEPENDENT_AMBULATORY_CARE_PROVIDER_SITE_OTHER): Payer: PPO

## 2021-12-17 DIAGNOSIS — Z9889 Other specified postprocedural states: Secondary | ICD-10-CM | POA: Diagnosis not present

## 2021-12-17 DIAGNOSIS — Z472 Encounter for removal of internal fixation device: Secondary | ICD-10-CM | POA: Diagnosis not present

## 2021-12-18 NOTE — Progress Notes (Signed)
Subjective:   Patient ID: Brittany [name], female   DOB: 76 y.o.   MRN: [id number]   HPI Patient presents with pain in the side of her right foot where surgery was done.  States that overall she is doing well with the toes not moving further but there is 1 spots tender   ROS      Objective:  Physical Exam  Neurovascular status intact the first MPJ overall is doing well no increase in the varus component with good range of motion no crepitus of the joint with an area of intense discomfort on the medial side of the metatarsal shaft where a pin was placed that may have moved in     Assessment:  At abnormal placement of pin right first metatarsal creating irritation     Plan:  Reviewed condition and at this point pin removal is going to be necessary.  I did explain procedure risk allowed her to go over consent form explained procedure risk patient wants surgery and after review signed consent form.  All questions answered scheduled for office surgery in the next several weeks  X-rays indicate that the distal pin is a moved out in the medial side and creating irritation of the tissue

## 2021-12-22 ENCOUNTER — Telehealth: Payer: Self-pay | Admitting: Podiatry

## 2021-12-22 NOTE — Telephone Encounter (Signed)
DOS: 01/12/2022  [employer]  Procedure: Removal Fixation Deep Kwire/Screw Rt ([id number])  DX: D35.70  [employer] Authorized Office Sx  Authorization #: [id number]  Valid 01/12/2022 - 04/12/2022

## 2021-12-30 ENCOUNTER — Encounter (HOSPITAL_BASED_OUTPATIENT_CLINIC_OR_DEPARTMENT_OTHER): Payer: Self-pay | Admitting: Cardiology

## 2021-12-30 NOTE — Telephone Encounter (Signed)
Dr. Marlou Porch patient, seen by PharmD. Please advise

## 2021-12-31 NOTE — Telephone Encounter (Signed)
Patient following up with you all

## 2022-01-02 ENCOUNTER — Encounter: Payer: Self-pay | Admitting: Cardiology

## 2022-01-02 ENCOUNTER — Telehealth: Payer: Self-pay | Admitting: Pharmacist

## 2022-01-02 NOTE — Telephone Encounter (Signed)
Patient is calling and requesting to speak with the pharmacist

## 2022-01-05 ENCOUNTER — Telehealth: Payer: Self-pay

## 2022-01-05 NOTE — Telephone Encounter (Signed)
Repatha approved through 07/06/22.

## 2022-01-05 NOTE — Telephone Encounter (Signed)
Spoke with patient. Reviewed her options of statin, zetia, nexletol, nexlizet or Repatha. Patient would like to proceed with Repatha. I will submit a prior authorization for Repatha.  Key: [id number]

## 2022-01-05 NOTE — Telephone Encounter (Signed)
Pt. Is interested in a grant

## 2022-01-05 NOTE — Telephone Encounter (Signed)
Brittany [name] called and wanted to cancel her office surgery with Dr. Paulla Dolly on 01/19/2022. She stated she wants to have a second opinion before getting this done. She stated if she decides to have Dr. Paulla Dolly do the surgery, she will call me back. Notified Dr. Paulla Dolly

## 2022-01-07 ENCOUNTER — Telehealth: Payer: Self-pay | Admitting: *Deleted

## 2022-01-07 DIAGNOSIS — G4733 Obstructive sleep apnea (adult) (pediatric): Secondary | ICD-10-CM

## 2022-01-07 NOTE — Telephone Encounter (Signed)
Dr. Radford Pax as asked for the pt to be set up with Itamar study. Pt agreeable and came by the office today and has been set up with Itamar study. Pt agreeable to signed waiver and to not open the box until she has been called with PIN#.

## 2022-01-07 NOTE — Telephone Encounter (Signed)
Sleep Apnea Evaluation  [facility]  Today's Date: 01/07/2022   Patient Name: Brittany [name]        DOB: 1946/01/02       Height:        Weight:    BMI: There is no height or weight on file to calculate BMI.    Referring Provider:  DR. Radford Pax   STOP-BANG RISK ASSESSMENT         If STOP-BANG Score ?3 OR two clinical symptoms - patient qualifies for [employer] (CPT 95800)      Sleep study ordered due to two (2) of the following clinical symptoms/diagnoses:  Excessive daytime sleepiness G47.10  Gastroesophageal reflux K21.9  Nocturia R35.1  Morning Headaches G44.221  Difficulty concentrating R41.840  Memory problems or poor judgment G31.84  Personality changes or irritability R45.4  Loud snoring R06.83  Depression F32.9  Unrefreshed by sleep G47.8  Impotence N52.9  History of high blood pressure R03.0  Insomnia G47.00  Sleep Disordered Breathing or Sleep Apnea ICD G47.33

## 2022-01-08 ENCOUNTER — Encounter: Payer: Self-pay | Admitting: Family Medicine

## 2022-01-09 NOTE — Telephone Encounter (Signed)
Prior Authorization for [facility] sent to [employer] via web portal. Tracking Number . NO PA REQ

## 2022-01-09 NOTE — Telephone Encounter (Signed)
I s/w the pt and informed she has been approved for Itamar study. Pt will do the study between tonight and this weekend. Called and made the patient aware that she may proceed with the [facility] Sleep Study. PIN # provided to the patient. Patient made aware that she will be contacted after the test has been read with the results and any recommendations. Patient verbalized understanding and thanked me for the call.     PIN # [id number] given to the pt.

## 2022-01-12 ENCOUNTER — Ambulatory Visit: Payer: PPO | Admitting: Podiatry

## 2022-01-12 NOTE — Telephone Encounter (Signed)
Please call her and let her know this will be a very simple procedure and needs to be done to fix her problem. I'd be happy to talk to her again

## 2022-01-12 NOTE — Telephone Encounter (Signed)
Patient called stating her device company told her her device is not registered.  Patient stated she would like a call back from [place]. to follow-up on this.

## 2022-01-12 NOTE — Telephone Encounter (Signed)
I have the pt a my chart message that the Itamar device has been registered now ans she may proceed.

## 2022-01-13 NOTE — Telephone Encounter (Signed)
I have sent the pt a my chart message to come by the office to pick up new device. I apologized for my error. I have registered the new device serial # [id number] for the pt. PIN# and stop bang still apply.

## 2022-01-14 MED ORDER — REPATHA SURECLICK 140 MG/ML ~~LOC~~ SOAJ: 1.0000 mL | SUBCUTANEOUS | 11 refills | Status: DC

## 2022-01-14 NOTE — Addendum Note (Signed)
Addended by: Marcelle Overlie D on: 01/14/2022 03:19 PM   Modules accepted: Orders

## 2022-01-15 ENCOUNTER — Encounter: Payer: Self-pay | Admitting: Cardiology

## 2022-01-15 ENCOUNTER — Encounter (HOSPITAL_BASED_OUTPATIENT_CLINIC_OR_DEPARTMENT_OTHER): Payer: PPO | Admitting: Cardiology

## 2022-01-15 DIAGNOSIS — G4733 Obstructive sleep apnea (adult) (pediatric): Secondary | ICD-10-CM

## 2022-01-15 DIAGNOSIS — R0683 Snoring: Secondary | ICD-10-CM | POA: Diagnosis not present

## 2022-01-16 ENCOUNTER — Telehealth: Payer: Self-pay | Admitting: *Deleted

## 2022-01-16 ENCOUNTER — Encounter: Payer: Self-pay | Admitting: Cardiology

## 2022-01-16 DIAGNOSIS — G4733 Obstructive sleep apnea (adult) (pediatric): Secondary | ICD-10-CM

## 2022-01-16 NOTE — Telephone Encounter (Signed)
Notified Julaine Hua ok to activate itamar device.

## 2022-01-16 NOTE — Telephone Encounter (Signed)
Pt came by the office today for new device. Pt did sleep study but did not have inspire set during the Itamar study. Per Dr. Radford Pax need repeat to be done with [facility]. New device serial # [id number].

## 2022-01-18 NOTE — Procedures (Signed)
       SLEEP STUDY REPORT Patient Information Study Date: 01/16/22 Patient Name: Brittany [name] Patient ID: [id number] Birth Date: [birth year] Age: 76 Gender: Female BMI: 20.1 (W=128 lb, H=5' 7'') Stopbang: 3 Referring Physician: [name], MD  TEST DESCRIPTION: Home sleep apnea testing was completed using the WatchPat, a Type 1 device, utilizing peripheral arterial tonometry (PAT), chest movement, actigraphy, pulse oximetry, pulse rate, body position and snore. AHI was calculated with apnea and hypopnea using valid sleep time as the denominator. RDI includes apneas, hypopneas, and RERAs. The data acquired and the scoring of sleep and all associated events were performed in accordance with the recommended standards and specifications as outlined in the AASM Manual for the Scoring of Sleep and Associated Events 2.2.0 (2015).   FINDINGS: 1.  No evidence of Obstructive Sleep Apnea with AHI 1.3/hr.  2.  No Central Sleep Apnea. 3.  Oxygen desaturations as low as 86%. 4.  Mild to moderate snoring was present. O2 sats were < 88% for0 minutes. 5.  Total sleep time was 6 hrs and 16 min. 6.  33.8% of total sleep time was spent in REM sleep.  7.  Normal sleep onset latency at 16 min.  8.  Prolonged REM sleep onset latency at 108 min.  9.  Total awakenings were 9.   DIAGNOSIS:  Normal study with no significant sleep disordered breathing.  RECOMMENDATIONS:   1. Normal study with no significant sleep disordered breathing.  2.  Healthy sleep recommendations include:  adequate nightly sleep (normal 7-9 hrs/night), avoidance of caffeine after noon and alcohol near bedtime, and maintaining a sleep environment that is cool, dark and quiet.  3.  Weight loss for overweight patients is recommended.    4.  Snoring recommendations include:  weight loss where appropriate, side sleeping, and avoidance of alcohol before bed.  5.  Operation of motor vehicle or dangerous equipment must be avoided when  feeling drowsy, excessively sleepy, or mentally fatigued.    6.  An ENT consultation which may be useful for specific causes of and possible treatment of bothersome snoring.   7. Weight loss may be of benefit in reducing the severity of snoring.   Signature:   [name], MD; [facility]; [place], [place] Board of Sleep Medicine Electronically Signed: 01/18/2022

## 2022-01-18 NOTE — Procedures (Signed)
       SLEEP STUDY REPORT Patient Information Study Date: 01/15/22 Patient Name: Brittany [name] Patient ID: [id number] Birth Date: 16-Apr-1945 Age: 76 Gender: Female BMI: 20.1 (W=128 lb, H=5' 7'') Stopbang: 3 Referring Physician: [name], MD  TEST DESCRIPTION: Home sleep apnea testing was completed using the WatchPat, a Type 1 device, utilizing peripheral arterial tonometry (PAT), chest movement, actigraphy, pulse oximetry, pulse rate, body position and snore. AHI was calculated with apnea and hypopnea using valid sleep time as the denominator. RDI includes apneas, hypopneas, and RERAs. The data acquired and the scoring of sleep and all associated events were performed in accordance with the recommended standards and specifications as outlined in the AASM Manual for the Scoring of Sleep and Associated Events 2.2.0 (2015).   FINDINGS: 1.  No evidence of Obstructive Sleep Apnea with AHI 3.6/hr.  2.  No Central Sleep Apnea. 3.  Oxygen desaturations as low as 85%. 4.  Moderate snoring was present. O2 sats were < 88% for 0.2 minutes. 5.  Total sleep time was 6 hrs and 43 min. 6.  31.4% of total sleep time was spent in REM sleep.  7.  Normal sleep onset latency at 16 min.  8.  Shortened REM sleep onset latency at 56 min.  9.  Total awakenings were9 .   DIAGNOSIS:  Normal study with no significant sleep disordered breathing.  RECOMMENDATIONS:   1. Normal study with no significant sleep disordered breathing.  2.  Healthy sleep recommendations include:  adequate nightly sleep (normal 7-9 hrs/night), avoidance of caffeine after noon and alcohol near bedtime, and maintaining a sleep environment that is cool, dark and quiet.  3.  Weight loss for overweight patients is recommended.    4.  Snoring recommendations include:  weight loss where appropriate, side sleeping, and avoidance of alcohol before bed.  5.  Operation of motor vehicle or dangerous equipment must be avoided when feeling  drowsy, excessively sleepy, or mentally fatigued.    6.  An ENT consultation which may be useful for specific causes of and possible treatment of bothersome snoring.   7. Weight loss may be of benefit in reducing the severity of snoring.   Signature:   [name], MD; [facility]; [place], [place] Board of Sleep Medicine Electronically Signed: 01/18/2022

## 2022-01-19 ENCOUNTER — Encounter: Payer: PPO | Admitting: Podiatry

## 2022-01-19 ENCOUNTER — Ambulatory Visit: Payer: PPO | Attending: Cardiology

## 2022-01-19 ENCOUNTER — Ambulatory Visit: Payer: PPO | Admitting: Podiatry

## 2022-01-19 DIAGNOSIS — G4733 Obstructive sleep apnea (adult) (pediatric): Secondary | ICD-10-CM

## 2022-01-20 ENCOUNTER — Telehealth: Payer: Self-pay | Admitting: *Deleted

## 2022-01-20 NOTE — Telephone Encounter (Signed)
-----   Message from Lauralee Evener, [place] sent at 01/19/2022  9:03 AM EST -----  ----- Message ----- From: [name], MD Sent: 01/18/2022   5:02 PM EST To: Cv Div Sleep Studies  No OSA on Inspire

## 2022-01-20 NOTE — Telephone Encounter (Signed)
The patient has been notified of the result and verbalized understanding.  All questions (if any) were answered. Brittany [name], [name] 01/20/2022 5:03 PM    Patient had no apneas - please reverify with patient that she did not turn her device on for the sleep study   she had no apneas on this study  Pt is aware and agreeable to normal results.

## 2022-01-21 ENCOUNTER — Other Ambulatory Visit: Payer: Self-pay | Admitting: Family Medicine

## 2022-02-02 ENCOUNTER — Encounter: Payer: PPO | Admitting: Podiatry

## 2022-02-06 ENCOUNTER — Telehealth: Payer: Self-pay | Admitting: Cardiology

## 2022-02-06 ENCOUNTER — Telehealth: Payer: Self-pay | Admitting: *Deleted

## 2022-02-06 DIAGNOSIS — M2031 Hallux varus (acquired), right foot: Secondary | ICD-10-CM | POA: Diagnosis not present

## 2022-02-06 DIAGNOSIS — T8484XA Pain due to internal orthopedic prosthetic devices, implants and grafts, initial encounter: Secondary | ICD-10-CM | POA: Diagnosis not present

## 2022-02-06 DIAGNOSIS — M7741 Metatarsalgia, right foot: Secondary | ICD-10-CM | POA: Diagnosis not present

## 2022-02-06 DIAGNOSIS — M79671 Pain in right foot: Secondary | ICD-10-CM | POA: Diagnosis not present

## 2022-02-06 DIAGNOSIS — M25374 Other instability, right foot: Secondary | ICD-10-CM | POA: Diagnosis not present

## 2022-02-06 NOTE — Telephone Encounter (Signed)
   Name: Brittany [name]  DOB: 1945/09/27  MRN: [id number]  Primary Cardiologist: Candee Furbish, MD   Preoperative team, please contact this patient and set up a phone call appointment for further preoperative risk assessment. Please obtain consent and complete medication review. Thank you for your help.  I confirm that guidance regarding antiplatelet and oral anticoagulation therapy has been completed and, if necessary, noted below (none requested).    Lenna Sciara, NP 02/06/2022, 2:49 PM [place]

## 2022-02-06 NOTE — Telephone Encounter (Signed)
   Pre-operative Risk Assessment    Patient Name: Brittany [name]  DOB: October 30, 1945 MRN: [id number]      Request for Surgical Clearance    Procedure:   REMOVAL OF DEEP IMPLANTS RIGHT 1st MT; RIGHT HALLUX MPJ ARTHRODESIS; 2nd AND 3rd MT WEIL OSTEOTOMIES AND COLLATERAL LIGAMENT REPAIRS  Date of Surgery:  Clearance TBD                                 Surgeon:  DR. Jenny Reichmann HEWITT Surgeon's Group or Practice Name:  [facility] Phone number:  [phone] Fax number:  [phone] ATTN: MEGAN DAVIS   Type of Clearance Requested:   - Medical ; NO MEDICATIONS LISTED AS NEEDING TO BE HELD   Type of Anesthesia:  Not Indicated;MESSAGE SENT TO SURGERY SCHEDULER 6. Are there any other requests or questions from the surgeon?    :1}  Additional requests/questions:    Brittany [name]   02/06/2022, 2:31 PM

## 2022-02-06 NOTE — Telephone Encounter (Signed)
Pt set up for tele 03/27/22 at 9 am. Pt states her surgery is not until mid Feb 2024. Med rec and consent are done.

## 2022-02-06 NOTE — Telephone Encounter (Signed)
Patient stated she will be having foot surgery and requested Vernie Ammons at Dr. Jenny Reichmann Hewitt's office be contacted at [phone] regarding getting clearance for this surgery.  Patient wants to know if she will need to see Dr. Marlou Porch prior to the surgery.

## 2022-02-06 NOTE — Telephone Encounter (Signed)
Pt set up for tele 03/27/22 at 9 am. Pt states her surgery is not until mid Feb 2024. Med rec and consent are done.     Patient Consent for Virtual Visit        Brittany [name] has provided verbal consent on 02/06/2022 for a virtual visit (video or telephone).   CONSENT FOR VIRTUAL VISIT FOR:  Brittany [name]  By participating in this virtual visit I agree to the following:  I hereby voluntarily request, consent and authorize [facility] and its employed or contracted physicians, physician assistants, nurse practitioners or other licensed health care professionals (the Practitioner), to provide me with telemedicine health care services (the "Services") as deemed necessary by the treating Practitioner. I acknowledge and consent to receive the Services by the Practitioner via telemedicine. I understand that the telemedicine visit will involve communicating with the Practitioner through live audiovisual communication technology and the disclosure of certain medical information by electronic transmission. I acknowledge that I have been given the opportunity to request an in-person assessment or other available alternative prior to the telemedicine visit and am voluntarily participating in the telemedicine visit.  I understand that I have the right to withhold or withdraw my consent to the use of telemedicine in the course of my care at any time, without affecting my right to future care or treatment, and that the Practitioner or I may terminate the telemedicine visit at any time. I understand that I have the right to inspect all information obtained and/or recorded in the course of the telemedicine visit and may receive copies of available information for a reasonable fee.  I understand that some of the potential risks of receiving the Services via telemedicine include:  Delay or interruption in medical evaluation due to technological equipment failure or disruption; Information transmitted may not  be sufficient (e.g. poor resolution of images) to allow for appropriate medical decision making by the Practitioner; and/or  In rare instances, security protocols could fail, causing a breach of personal health information.  Furthermore, I acknowledge that it is my responsibility to provide information about my medical history, conditions and care that is complete and accurate to the best of my ability. I acknowledge that Practitioner's advice, recommendations, and/or decision may be based on factors not within their control, such as incomplete or inaccurate data provided by me or distortions of diagnostic images or specimens that may result from electronic transmissions. I understand that the practice of medicine is not an exact science and that Practitioner makes no warranties or guarantees regarding treatment outcomes. I acknowledge that a copy of this consent can be made available to me via my patient portal ([facility]), or I can request a printed copy by calling the office of [facility].    I understand that my insurance will be billed for this visit.   I have read or had this consent read to me. I understand the contents of this consent, which adequately explains the benefits and risks of the Services being provided via telemedicine.  I have been provided ample opportunity to ask questions regarding this consent and the Services and have had my questions answered to my satisfaction. I give my informed consent for the services to be provided through the use of telemedicine in my medical care

## 2022-02-06 NOTE — Telephone Encounter (Signed)
ADDENDUM: ANESTHESIA WILL BE: GENERAL AND REGIONAL

## 2022-02-09 ENCOUNTER — Telehealth: Payer: Self-pay | Admitting: *Deleted

## 2022-02-09 ENCOUNTER — Encounter (HOSPITAL_BASED_OUTPATIENT_CLINIC_OR_DEPARTMENT_OTHER): Payer: Self-pay | Admitting: Cardiology

## 2022-02-09 NOTE — Telephone Encounter (Signed)
Patient responding

## 2022-02-09 NOTE — Telephone Encounter (Signed)
I s/w the pt today as she had sent a my chart message about needing sooner appt for pre op clearance. Pt states she surgery is not until 04/2022 but surgeon office will not schedule until they know she has been cleared. I explained to the pt that the clearance we give will only be good for 2 months, so we were trying to not schedule too far out ahead. I assured the pt that I will let the surgeon office know of new tele appt 02/16/22. Pt thanked me for the help and apologized for the extra work, I said no problem I was happy to help.

## 2022-02-16 ENCOUNTER — Ambulatory Visit: Payer: PPO | Attending: Cardiology | Admitting: Student

## 2022-02-16 DIAGNOSIS — Z01810 Encounter for preprocedural cardiovascular examination: Secondary | ICD-10-CM

## 2022-02-16 NOTE — Progress Notes (Signed)
Virtual Visit via Telephone Note   Because of Brittany [name] co-morbid illnesses, she is at least at moderate risk for complications without adequate follow up.  This format is felt to be most appropriate for this patient at this time.  The patient did not have access to video technology/had technical difficulties with video requiring transitioning to audio format only (telephone).  All issues noted in this document were discussed and addressed.  No physical exam could be performed with this format.  Please refer to the patient's chart for her consent to telehealth for [facility].  Evaluation Performed:  Preoperative Cardiovascular Risk Assessment _____________   Date:  02/16/2022   Patient ID:  Brittany [name], DOB 1945-12-02, MRN [id number] Patient Location:  Home Provider location:   Office  Primary Care Provider:  Farrel Conners, MD Primary Cardiologist:  Candee Furbish, MD  Chief Complaint / Patient Profile   Brittany [name] is a 76 y.o. year old female with a history of paroxysmal atrial fibrillation s/p ablation in 10/2018 no longer on anticoagulation, SVT, hyperlipidemia, obstructive sleep apnea s/p Inspire device implant, gastritis, esophageal stricture, migraines, and breast cancer who is pending removal of deep implants of right 1st metatarsal, right hallux MPJ arthrodesis, and 2nd and 3rd metatarsal osteotomies and collateral ligament repairs and presents today for telephonic preoperative cardiovascular risk assessment.  Past Medical History    Past Medical History:  Diagnosis Date   A-fib ([place])    Acid reflux    Acute cystitis with positive culture 03/06/2014   Anemia    Anxiety    Arthritis    Baker cyst, left 04/06/2016   Aspirated 04/06/2016   Breast cancer ([place]) 11/23/2007   right, ER/PR -, HER 2 -   Cognitive deficits 09/25/2014   2016 Very mild, could be related to meds    Degenerative arthritis of left knee 04/06/2016   Injection and aspiration  04/06/2016   Degenerative arthritis of right knee 09/27/2015   Depression    Esophageal stricture    Gastritis 10/2007   History of chemotherapy    neoadjuvant chemo, last dose 03/2008   History of radiation therapy 07/25/08 -09/11/08   right breast   Hx of colonic polyps 08/11/2016   Hyperlipidemia    Migraines    rare   Myalgia 07/18/2013   5/15 multiple site    OSA (obstructive sleep apnea)    severe OSA AHI 34, intolerant to PAP and now s/p hypoglossal nerve stimulator implant   Osteopenia    Rupture of quadriceps tendon 03/23/2017   Past Surgical History:  Procedure Laterality Date   ATRIAL FIBRILLATION ABLATION N/A 10/07/2018   Procedure: ATRIAL FIBRILLATION ABLATION;  Surgeon: [name], MD;  Location: [facility];  Service: Cardiovascular;  Laterality: N/A;   BREAST LUMPECTOMY  06/26/2008   right, CA   XRT, chemo   COLONOSCOPY     DRUG INDUCED ENDOSCOPY N/A 02/21/2020   Procedure: DRUG INDUCED ENDOSCOPY;  Surgeon: Melida Quitter, MD;  Location: [facility];  Service: ENT;  Laterality: N/A;   EYE SURGERY Bilateral    growth removed per right thigh     as teen - mole   IMPLANTATION OF HYPOGLOSSAL NERVE STIMULATOR N/A 05/15/2020   Procedure: IMPLANTATION OF HYPOGLOSSAL NERVE STIMULATOR;  Surgeon: Melida Quitter, MD;  Location: [facility];  Service: ENT;  Laterality: N/A;   JOINT REPLACEMENT  12/31/2015   RTK   QUADRICEPS TENDON REPAIR Right 09/14/2016  Procedure: OPEN REPAIR QUADRICEP TENDON;  Surgeon: [name], MD;  Location: [facility];  Service: Orthopedics;  Laterality: Right;   TOTAL KNEE ARTHROPLASTY Right 12/31/2015   Procedure: RIGHT TOTAL KNEE ARTHROPLASTY;  Surgeon: [name], MD;  Location: [facility];  Service: Orthopedics;  Laterality: Right;   TOTAL KNEE ARTHROPLASTY Left 10/11/2020   Procedure: TOTAL KNEE ARTHROPLASTY;  Surgeon: [name], MD;  Location: [facility];  Service: Orthopedics;  Laterality: Left;   UPPER GASTROINTESTINAL  ENDOSCOPY      Allergies  Allergies  Allergen Reactions   Boniva [Ibandronic Acid] Nausea Only   Hydrocodone-Acetaminophen Other (See Comments)    "Does not relieve pain", patient reported.  Can take Tylenol.   Lipitor [Atorvastatin] Other (See Comments)    Muscle and joint pain(s)   Pt request removal of this intolerance   Tramadol Other (See Comments)    "Does not help pain."- patient reported.    History of Present Illness    Brittany [name] is a 76 y.o. female who presents via audio/video conferencing for a telehealth visit today.  Patient was last seen in our office on 10/30/2021 by Christen Bame, NP.  At that time, she was doing well was doing well from a cardiac standpoint.  She is now pending procedure as outlined above. Since her last visit, she has continued to do well from a cardiac standpoint. She denies any chest pain, shortness of breath, orthopnea/PND, palpitations, lightheadedness, dizziness, syncope. Her activity is limited by her significant foot pain but she is still able to complete >4.0 METS without any chest pain or shortness of breath.   Home Medications    Prior to Admission medications   Medication Sig Start Date End Date Taking? Authorizing Provider  acetaminophen (TYLENOL) 500 MG tablet Take 500 mg by mouth every 6 (six) hours as needed.    [provider]  benzonatate (TESSALON) 100 MG capsule Take 1 capsule (100 mg total) by mouth 2 (two) times daily as needed for cough. 11/10/21   Marin Olp, MD  Biotin 5000 MCG TABS Take 5,000 mcg by mouth daily.    [provider]  Calcium-Magnesium-Zinc (CAL-MAG-ZINC PO) Take 1 tablet by mouth daily. 600 mg    [provider]  Cholecalciferol (VITAMIN D) 50 MCG (2000 UT) tablet Take 2,000 Units by mouth daily.    [provider]  citalopram (CELEXA) 40 MG tablet Take 1 tablet (40 mg total) by mouth daily. 10/27/21   Farrel Conners, MD  Cyanocobalamin (VITAMIN B-12 PO) Take  2,500 mcg by mouth daily.    [provider]  denosumab (PROLIA) 60 MG/ML SOSY injection Inject 60 mg into the skin every 6 (six) months.    [provider]  diclofenac Sodium (VOLTAREN) 1 % GEL Apply 2 g topically 4 (four) times daily.    [provider]  diphenhydrAMINE (BENADRYL) 25 MG tablet Take 25 mg by mouth every 6 (six) hours as needed for allergies.    [provider]  Evolocumab (REPATHA SURECLICK) 696 MG/ML SOAJ Inject 140 mg into the skin every 14 (fourteen) days. 01/14/22   [name], MD  famotidine (PEPCID) 40 MG tablet Take 1 tablet (40 mg total) by mouth daily. 10/27/21   Farrel Conners, MD  fluticasone [facility]) 50 MCG/ACT nasal spray SPRAY 2 SPRAYS INTO EACH NOSTRIL EVERY DAY 01/21/22   Farrel Conners, MD  Omega-3 Fatty Acids (FISH OIL) 1200 MG CAPS Take 1,200 mg by mouth daily.  [provider]  Probiotic Product (PROBIOTIC DAILY PO) Take 1 tablet by mouth daily.    [provider]  SUMAtriptan (IMITREX) 100 MG tablet Take 1 tablet (100 mg total) by mouth once for 1 dose. Can repeat dose in 2 hours if symptoms persist. Max dose 2 tablets in 24 hours. 05/25/21   [name], [name], MD  zolpidem (AMBIEN) 5 MG tablet Take 0.5-1 tablets (2.5-5 mg total) by mouth at bedtime as needed for sleep. 10/27/21   Farrel Conners, MD    Physical Exam    Vital Signs:  [name] does not have vital signs available for review today.  Given telephonic nature of communication, physical exam is limited. Alert and oriented x3. No acute distress. Normal affect. Speech and respirations are unlabored.  Accessory Clinical Findings    None  Assessment & Plan    Preoperative Cardiovascular Risk Assessment: Patient has an upcoming foot surgery planned. she is stable from a cardiac standpoint as described above. She is able to complete >4.0 METS without any anginal symptoms. Per Revised Cardiac Risk Index [place] Criteria),  considered low risk. Therefore, based on ACC/AHA guidelines, patient would be at acceptable risk for the planned procedure without further cardiovascular testing. I will route this recommendation to the requesting party via Epic fax function.   A copy of this note will be routed to requesting surgeon.  Time:   Today, I have spent 7 minutes with the patient with telehealth technology discussing medical history, symptoms, and management plan.     Darreld Mclean, PA-C  02/16/2022, 2:01 PM

## 2022-03-09 ENCOUNTER — Ambulatory Visit (INDEPENDENT_AMBULATORY_CARE_PROVIDER_SITE_OTHER): Payer: PPO

## 2022-03-09 ENCOUNTER — Ambulatory Visit: Payer: PPO

## 2022-03-09 VITALS — Ht 67.0 in | Wt 128.0 lb

## 2022-03-09 DIAGNOSIS — Z0000 Encounter for general adult medical examination without abnormal findings: Secondary | ICD-10-CM

## 2022-03-09 NOTE — Patient Instructions (Addendum)
Brittany [name] , Thank you for taking time to come for your Medicare Wellness Visit. I appreciate your ongoing commitment to your health goals. Please review the following plan we discussed and let me know if I can assist you in the future.   These are the goals we discussed:  Goals       No current goals (pt-stated)      Patient Stated      Maintain current health status by continuing to exercise and eat well, enjoy family and life.      Patient Stated      Maintain current health status and [place].      Patient Stated      Do more exercise       Patient Stated      03/04/2021, no goals        This is a list of the screening recommended for you and due dates:  Health Maintenance  Topic Date Due   COVID-19 Vaccine (7 - 2023-24 season) 03/25/2022*   Flu Shot  06/01/2022*   Mammogram  03/10/2023*   Medicare Annual Wellness Visit  03/10/2023   Colon Cancer Screening  02/06/2025   DTaP/Tdap/Td vaccine (3 - Td or Tdap) 03/03/2025   Pneumonia Vaccine  Completed   DEXA scan (bone density measurement)  Completed   Hepatitis C Screening: USPSTF Recommendation to screen - Ages 85-79 yo.  Completed   Zoster (Shingles) Vaccine  Completed   HPV Vaccine  Aged Out  *Topic was postponed. The date shown is not the original due date.    Advanced directives: Please bring a copy of your health care power of attorney and living will to the office to be added to your chart at your convenience.   Conditions/risks identified: None  Next appointment: Follow up in one year for your annual wellness visit     Preventive Care 65 Years and Older, Female Preventive care refers to lifestyle choices and visits with your health care provider that can promote health and wellness. What does preventive care include? A yearly physical exam. This is also called an annual well check. Dental exams once or twice a year. Routine eye exams. Ask your health care provider how often you should have your  eyes checked. Personal lifestyle choices, including: Daily care of your teeth and gums. Regular physical activity. Eating a healthy diet. Avoiding tobacco and drug use. Limiting alcohol use. Practicing safe sex. Taking low-dose aspirin every day. Taking vitamin and mineral supplements as recommended by your health care provider. What happens during an annual well check? The services and screenings done by your health care provider during your annual well check will depend on your age, overall health, lifestyle risk factors, and family history of disease. Counseling  Your health care provider may ask you questions about your: Alcohol use. Tobacco use. Drug use. Emotional well-being. Home and relationship well-being. Sexual activity. Eating habits. History of falls. Memory and ability to understand (cognition). Work and work Statistician. Reproductive health. Screening  You may have the following tests or measurements: Height, weight, and BMI. Blood pressure. Lipid and cholesterol levels. These may be checked every 5 years, or more frequently if you are over 9 years old. Skin check. Lung cancer screening. You may have this screening every year starting at age 21 if you have a 30-pack-year history of smoking and currently smoke or have quit within the past 15 years. Fecal occult blood test (FOBT) of the stool. You may have  this test every year starting at age 17. Flexible sigmoidoscopy or colonoscopy. You may have a sigmoidoscopy every 5 years or a colonoscopy every 10 years starting at age 26. Hepatitis C blood test. Hepatitis B blood test. Sexually transmitted disease (STD) testing. Diabetes screening. This is done by checking your blood sugar (glucose) after you have not eaten for a while (fasting). You may have this done every 1-3 years. Bone density scan. This is done to screen for osteoporosis. You may have this done starting at age 31. Mammogram. This may be done every 1-2  years. Talk to your health care provider about how often you should have regular mammograms. Talk with your health care provider about your test results, treatment options, and if necessary, the need for more tests. Vaccines  Your health care provider may recommend certain vaccines, such as: Influenza vaccine. This is recommended every year. Tetanus, diphtheria, and acellular pertussis (Tdap, Td) vaccine. You may need a Td booster every 10 years. Zoster vaccine. You may need this after age 51. Pneumococcal 13-valent conjugate (PCV13) vaccine. One dose is recommended after age 54. Pneumococcal polysaccharide (PPSV23) vaccine. One dose is recommended after age 8. Talk to your health care provider about which screenings and vaccines you need and how often you need them. This information is not intended to replace advice given to you by your health care provider. Make sure you discuss any questions you have with your health care provider. Document Released: 03/15/2015 Document Revised: 11/06/2015 Document Reviewed: 12/18/2014 Elsevier Interactive Patient Education  2017 [place] Prevention in the Home Falls can cause injuries. They can happen to people of all ages. There are many things you can do to make your home safe and to help prevent falls. What can I do on the outside of my home? Regularly fix the edges of walkways and driveways and fix any cracks. Remove anything that might make you trip as you walk through a door, such as a raised step or threshold. Trim any bushes or trees on the path to your home. Use bright outdoor lighting. Clear any walking paths of anything that might make someone trip, such as rocks or tools. Regularly check to see if handrails are loose or broken. Make sure that both sides of any steps have handrails. Any raised decks and porches should have guardrails on the edges. Have any leaves, snow, or ice cleared regularly. Use sand or salt on walking paths  during winter. Clean up any spills in your garage right away. This includes oil or grease spills. What can I do in the bathroom? Use night lights. Install grab bars by the toilet and in the tub and shower. Do not use towel bars as grab bars. Use non-skid mats or decals in the tub or shower. If you need to sit down in the shower, use a plastic, non-slip stool. Keep the floor dry. Clean up any water that spills on the floor as soon as it happens. Remove soap buildup in the tub or shower regularly. Attach bath mats securely with double-sided non-slip rug tape. Do not have throw rugs and other things on the floor that can make you trip. What can I do in the bedroom? Use night lights. Make sure that you have a light by your bed that is easy to reach. Do not use any sheets or blankets that are too big for your bed. They should not hang down onto the floor. Have a firm chair that has side arms. You  can use this for support while you get dressed. Do not have throw rugs and other things on the floor that can make you trip. What can I do in the kitchen? Clean up any spills right away. Avoid walking on wet floors. Keep items that you use a lot in easy-to-reach places. If you need to reach something above you, use a strong step stool that has a grab bar. Keep electrical cords out of the way. Do not use floor polish or wax that makes floors slippery. If you must use wax, use non-skid floor wax. Do not have throw rugs and other things on the floor that can make you trip. What can I do with my stairs? Do not leave any items on the stairs. Make sure that there are handrails on both sides of the stairs and use them. Fix handrails that are broken or loose. Make sure that handrails are as long as the stairways. Check any carpeting to make sure that it is firmly attached to the stairs. Fix any carpet that is loose or worn. Avoid having throw rugs at the top or bottom of the stairs. If you do have throw  rugs, attach them to the floor with carpet tape. Make sure that you have a light switch at the top of the stairs and the bottom of the stairs. If you do not have them, ask someone to add them for you. What else can I do to help prevent falls? Wear shoes that: Do not have high heels. Have rubber bottoms. Are comfortable and fit you well. Are closed at the toe. Do not wear sandals. If you use a stepladder: Make sure that it is fully opened. Do not climb a closed stepladder. Make sure that both sides of the stepladder are locked into place. Ask someone to hold it for you, if possible. Clearly mark and make sure that you can see: Any grab bars or handrails. First and last steps. Where the edge of each step is. Use tools that help you move around (mobility aids) if they are needed. These include: Canes. Walkers. Scooters. Crutches. Turn on the lights when you go into a dark area. Replace any light bulbs as soon as they burn out. Set up your furniture so you have a clear path. Avoid moving your furniture around. If any of your floors are uneven, fix them. If there are any pets around you, be aware of where they are. Review your medicines with your doctor. Some medicines can make you feel dizzy. This can increase your chance of falling. Ask your doctor what other things that you can do to help prevent falls. This information is not intended to replace advice given to you by your health care provider. Make sure you discuss any questions you have with your health care provider. Document Released: 12/13/2008 Document Revised: 07/25/2015 Document Reviewed: 03/23/2014 Elsevier Interactive Patient Education  2017 [employer].

## 2022-03-09 NOTE — Progress Notes (Signed)
Subjective:   Brittany [name] is a 77 y.o. female who presents for Medicare Annual (Subsequent) preventive examination.  Review of Systems    Virtual Visit via Telephone Note  I connected with  Brittany [name] on 03/09/22 at 12:30 PM EST by telephone and verified that I am speaking with the correct person using two identifiers.  Location: Patient: Home Provider: Office Persons participating in the virtual visit: patient/Nurse Health Advisor   I discussed the limitations, risks, security and privacy concerns of performing an evaluation and management service by telephone and the availability of in person appointments. The patient expressed understanding and agreed to proceed.  Interactive audio and video telecommunications were attempted between this nurse and patient, however failed, due to patient having technical difficulties OR patient did not have access to video capability.  We continued and completed visit with audio only.  Some vital signs may be absent or patient reported.   Criselda Peaches, LPN  Cardiac Risk Factors include: advanced age (>24mn, >>34women)     Objective:    Today's Vitals   03/09/22 1242  Weight: 128 lb (58.1 kg)  Height: '5\' 7"'$  (1.702 m)   Body mass index is 20.05 kg/m.     03/09/2022   12:48 PM 12/05/2021   11:35 AM 03/04/2021    1:30 PM 01/09/2021   10:54 PM 10/25/2020   10:23 AM 10/12/2020   12:00 PM 09/30/2020   11:37 AM  Advanced Directives  Does Patient Have a Medical Advance Directive? Yes Yes Yes Yes Yes Yes Yes  Type of AParamedicof [place]amersvilleLiving will Living will [place]South [place]Living will Living will HPaterosLiving will HHagamanLiving will HOketoLiving will  Does patient want to make changes to medical advance directive?    No - Patient declined     Copy of HGaylordin Chart? No - copy requested  No - copy requested  No  - copy requested No - copy requested No - copy requested    Current Medications (verified) Outpatient Encounter Medications as of 03/09/2022  Medication Sig   acetaminophen (TYLENOL) 500 MG tablet Take 500 mg by mouth every 6 (six) hours as needed.   Biotin 5000 MCG TABS Take 5,000 mcg by mouth daily.   Calcium-Magnesium-Zinc (CAL-MAG-ZINC PO) Take 1 tablet by mouth daily. 600 mg   Cholecalciferol (VITAMIN D) 50 MCG (2000 UT) tablet Take 2,000 Units by mouth daily.   citalopram (CELEXA) 40 MG tablet Take 1 tablet (40 mg total) by mouth daily.   Cyanocobalamin (VITAMIN B-12 PO) Take 2,500 mcg by mouth daily.   denosumab (PROLIA) 60 MG/ML SOSY injection Inject 60 mg into the skin every 6 (six) months.   diclofenac Sodium (VOLTAREN) 1 % GEL Apply 2 g topically 4 (four) times daily.   diphenhydrAMINE (BENADRYL) 25 MG tablet Take 25 mg by mouth every 6 (six) hours as needed for allergies.   Evolocumab (REPATHA SURECLICK) 1952MG/ML SOAJ Inject 140 mg into the skin every 14 (fourteen) days.   famotidine (PEPCID) 40 MG tablet Take 1 tablet (40 mg total) by mouth daily.   fluticasone (FLONASE) 50 MCG/ACT nasal spray SPRAY 2 SPRAYS INTO EACH NOSTRIL EVERY DAY   Omega-3 Fatty Acids (FISH OIL) 1200 MG CAPS Take 1,200 mg by mouth daily.   Probiotic Product (PROBIOTIC DAILY PO) Take 1 tablet by mouth daily.   SUMAtriptan (IMITREX) 100 MG tablet Take 1 tablet (  100 mg total) by mouth once for 1 dose. Can repeat dose in 2 hours if symptoms persist. Max dose 2 tablets in 24 hours.   zolpidem (AMBIEN) 5 MG tablet Take 0.5-1 tablets (2.5-5 mg total) by mouth at bedtime as needed for sleep.   [DISCONTINUED] benzonatate (TESSALON) 100 MG capsule Take 1 capsule (100 mg total) by mouth 2 (two) times daily as needed for cough.   No facility-administered encounter medications on file as of 03/09/2022.    Allergies (verified) Boniva [ibandronic acid], Hydrocodone-acetaminophen, Lipitor [atorvastatin], and Tramadol    History: Past Medical History:  Diagnosis Date   A-fib ([place])    Acid reflux    Acute cystitis with positive culture 03/06/2014   Anemia    Anxiety    Arthritis    Baker cyst, left 04/06/2016   Aspirated 04/06/2016   Breast cancer ([place]) 11/23/2007   right, ER/PR -, HER 2 -   Cognitive deficits 09/25/2014   2016 Very mild, could be related to meds    Degenerative arthritis of left knee 04/06/2016   Injection and aspiration 04/06/2016   Degenerative arthritis of right knee 09/27/2015   Depression    Esophageal stricture    Gastritis 10/2007   History of chemotherapy    neoadjuvant chemo, last dose 03/2008   History of radiation therapy 07/25/08 -09/11/08   right breast   Hx of colonic polyps 08/11/2016   Hyperlipidemia    Migraines    rare   Myalgia 07/18/2013   5/15 multiple site    OSA (obstructive sleep apnea)    severe OSA AHI 34, intolerant to PAP and now s/p hypoglossal nerve stimulator implant   Osteopenia    Rupture of quadriceps tendon 03/23/2017   Past Surgical History:  Procedure Laterality Date   ATRIAL FIBRILLATION ABLATION N/A 10/07/2018   Procedure: ATRIAL FIBRILLATION ABLATION;  Surgeon: [name], MD;  Location: [facility];  Service: Cardiovascular;  Laterality: N/A;   BREAST LUMPECTOMY  06/26/2008   right, CA   XRT, chemo   COLONOSCOPY     DRUG INDUCED ENDOSCOPY N/A 02/21/2020   Procedure: DRUG INDUCED ENDOSCOPY;  Surgeon: Melida Quitter, MD;  Location: [facility];  Service: ENT;  Laterality: N/A;   EYE SURGERY Bilateral    growth removed per right thigh     as teen - mole   IMPLANTATION OF HYPOGLOSSAL NERVE STIMULATOR N/A 05/15/2020   Procedure: IMPLANTATION OF HYPOGLOSSAL NERVE STIMULATOR;  Surgeon: Melida Quitter, MD;  [facility];  Service: ENT;  Laterality: N/A;   JOINT REPLACEMENT  12/31/2015   RTK   QUADRICEPS TENDON REPAIR Right 09/14/2016   Procedure: OPEN REPAIR QUADRICEP TENDON;  Surgeon: [name], MD;  Location: [facility];  Service: Orthopedics;  Laterality: Right;   TOTAL KNEE ARTHROPLASTY Right 12/31/2015   Procedure: RIGHT TOTAL KNEE ARTHROPLASTY;  Surgeon: [name], MD;  Location: [facility];  Service: Orthopedics;  Laterality: Right;   TOTAL KNEE ARTHROPLASTY Left 10/11/2020   Procedure: TOTAL KNEE ARTHROPLASTY;  Surgeon: [name], MD;  Location: [facility];  Service: Orthopedics;  Laterality: Left;   UPPER GASTROINTESTINAL ENDOSCOPY     Family History  Problem Relation Age of Onset   Heart disease Father    Cancer Father        lung-nonsmoker   Breast cancer Sister 5   Colon cancer Sister 68   Epilepsy Mother        at older age   Dementia Mother        ?  uncertain type   Hip fracture Mother        x2   Other Brother        suicide   Other Maternal Grandfather        didn't go to doctor   Esophageal cancer Neg Hx    Stomach cancer Neg Hx    Social History   Socioeconomic History   Marital status: Married    Spouse name: Not on file   Number of children: 3   Years of education: Not on file   Highest education level: Associate degree: academic program  Occupational History    Employer: RETIRED  Tobacco Use   Smoking status: Former    Packs/day: 0.25    Years: 5.00    Total pack years: 1.25    Types: Cigarettes    Quit date: 03/03/1967    Years since quitting: 55.0   Smokeless tobacco: Never   Tobacco comments:    6 cig per day when she was smoking/quit years ago  Vaping Use   Vaping Use: Never used  Substance and Sexual Activity   Alcohol use: Yes    Alcohol/week: 7.0 standard drinks of alcohol    Types: 7 Cans of beer per week    Comment: 1 beer per day - 2.4 % alcholol - [employer] 55   Drug use: No   Sexual activity: Yes    Partners: Male    Birth control/protection: Post-menopausal  Other Topics Concern   Not on file  Social History Narrative   G3, P3, 1st pregnancy age 36, menopause age 48, no HRT   Social Determinants of Health    Financial Resource Strain: Low Risk  (03/09/2022)   Overall Financial Resource Strain (CARDIA)    Difficulty of Paying Living Expenses: Not hard at all  Food Insecurity: No Food Insecurity (03/09/2022)   Hunger Vital Sign    Worried About Running Out of Food in the Last Year: Never true    Ran Out of Food in the Last Year: Never true  Transportation Needs: No Transportation Needs (03/09/2022)   PRAPARE - Hydrologist (Medical): No    Lack of Transportation (Non-Medical): No  Physical Activity: Inactive (03/09/2022)   Exercise Vital Sign    Days of Exercise per Week: 0 days    Minutes of Exercise per Session: 0 min  Stress: No Stress Concern Present (03/09/2022)   [place]    Feeling of Stress : Not at all  Social Connections: [place] (03/09/2022)   Social Connection and Isolation Panel [NHANES]    Frequency of Communication with Friends and Family: More than three times a week    Frequency of Social Gatherings with Friends and Family: More than three times a week    Attends Religious Services: More than 4 times per year    Active Member of [employer] or Organizations: Yes    Attends Music therapist: More than 4 times per year    Marital Status: Married    Tobacco Counseling Counseling given: Not Answered Tobacco comments: 6 cig per day when she was smoking/quit years ago   Clinical Intake:  Pre-visit preparation completed: No  Pain : No/denies pain     BMI - recorded: 20.05 Nutritional Status: BMI of 19-24  Normal Nutritional Risks: None Diabetes: No  How often do you need to have someone help you when you read instructions, pamphlets, or other written materials  from your doctor or pharmacy?: 1 - Never  Diabetic?  No  Interpreter Needed?: No  Information entered by :: Rolene Arbour LPN   Activities of Daily Living    03/09/2022   12:47 PM  In your  present state of health, do you have any difficulty performing the following activities:  Hearing? 0  Vision? 0  Difficulty concentrating or making decisions? 0  Walking or climbing stairs? 0  Dressing or bathing? 0  Doing errands, shopping? 0  Preparing Food and eating ? N  Using the Toilet? N  In the past six months, have you accidently leaked urine? N  Do you have problems with loss of bowel control? N  Managing your Medications? N  Managing your Finances? N  Housekeeping or managing your Housekeeping? N    Patient Care Team: Farrel Conners, MD as PCP - General (Family Medicine) [name], MD as PCP - Cardiology (Cardiology) [name], MD as PCP - Electrophysiology (Cardiology) [name], MD as PCP - Sleep Medicine (Cardiology) [name], [name], MD (Inactive) as Consulting Physician (Hematology and Oncology) [name], MD as Consulting Physician (Sleep Medicine) [name], [facility] as Pharmacist (Pharmacist) [name], MD as Attending Physician (Orthopedic Surgery)  Indicate any recent Medical Services you may have received from other than [facility] providers in the past year (date may be approximate).     Assessment:   This is a routine wellness examination for [place].  Hearing/Vision screen Hearing Screening - Comments:: Denies hearing difficulties   Vision Screening - Comments:: Wears rx glasses - up to date with routine eye exams with  Dr Katy Fitch  Dietary issues and exercise activities discussed: Exercise limited by: orthopedic condition(s)   Goals Addressed               This Visit's Progress     No current goals (pt-stated)         Depression Screen    03/09/2022   12:46 PM 10/27/2021   11:03 AM 04/23/2021    2:37 PM 03/04/2021    1:32 PM 02/10/2021   11:01 AM 11/06/2020    1:36 PM 02/20/2020    2:46 PM  PHQ 2/9 Scores  PHQ - 2 Score 0 1 1 0 0 2 0  PHQ- 9 Score  '1 1  2 3     '$ Fall Risk    03/09/2022   12:48 PM 03/05/2022     9:27 AM 03/04/2021    1:31 PM 03/03/2021    3:42 PM 02/10/2021   11:00 AM  Fall Risk   Falls in the past year? 0 0 '1 1 1  '$ Comment   balance issues    Number falls in past yr: 0  1 1 0  Injury with Fall? 0  0  0  Risk for fall due to : No Fall Risks  Impaired balance/gait;Medication side effect    Follow up Falls prevention discussed  Falls evaluation completed;Education provided      FALL RISK PREVENTION PERTAINING TO THE HOME:  Any stairs in or around the home? Yes  If so, are there any without handrails? No  Home free of loose throw rugs in walkways, pet beds, electrical cords, etc? Yes  Adequate lighting in your home to reduce risk of falls? Yes   ASSISTIVE D EVICES UTILIZED TO PREVENT FALLS:  Life alert? No  Use of a cane, walker or w/c? No  Grab bars in the bathroom? Yes  Shower chair or bench in shower? No  Elevated toilet seat or a handicapped toilet? No   TIMED UP AND GO:  Was the test performed? No . Audio Visit   Cognitive Function:    02/18/2018   10:27 AM 02/12/2017   11:03 AM  MMSE - Mini Mental State Exam  Orientation to time 5 5  Orientation to Place 5 5  Registration 3 3  Attention/ Calculation 5 5  Recall 3 3  Language- name 2 objects 2 2  Language- repeat 1 1  Language- follow 3 step command 3 3  Language- read & follow direction 1 1  Write a sentence 1 1  Copy design 1 1  Total score 30 30        03/09/2022   12:48 PM 03/04/2021    1:33 PM 02/20/2020    2:52 PM  6CIT Screen  What Year? 0 points 0 points 0 points  What month? 0 points 0 points 0 points  What time? 0 points 0 points   Count back from 20 0 points 0 points 0 points  Months in reverse 0 points 0 points 0 points  Repeat phrase 0 points 2 points 0 points  Total Score 0 points 2 points     Immunizations Immunization History  Administered Date(s) Administered   Fluad Quad(high Dose 65+) 11/06/2020   Influenza Split 12/01/2011   Influenza Whole 12/06/2007, 11/15/2008,  01/06/2010, 11/11/2010   Influenza, High Dose Seasonal PF 12/20/2012, 11/20/2014, 11/21/2017, 11/07/2019   Influenza,inj,Quad PF,6+ Mos 11/30/2013   Influenza,inj,quad, With Preservative 11/02/2016   Influenza-Unspecified 11/27/2015, 10/31/2016, 10/27/2018   Moderna Covid-19 Vaccine Bivalent Booster 31yr & up 02/15/2021, 12/12/2021   Moderna Sars-Covid-2 Vaccination 03/30/2019, 04/27/2019, 01/08/2020, 09/30/2020   PNEUMOCOCCAL CONJUGATE-20 02/10/2021   Pneumococcal Conjugate-13 03/22/2013   Pneumococcal Polysaccharide-23 01/31/2009, 07/31/2015, 06/20/2019   Rabies, IM 03/04/2015, 03/07/2015, 03/11/2015, 03/18/2015   Td 10/09/2009   Tdap 03/04/2015   Zoster Recombinat (Shingrix) 02/13/2017, 07/03/2017   Zoster, Live 04/18/2009    TDAP status: Up to date  Flu Vaccine status: Up to date  Pneumococcal vaccine status: Up to date  Covid-19 vaccine status: Completed vaccines  Qualifies for Shingles Vaccine? Yes   Zostavax completed Yes   Shingrix Completed?: Yes  Screening Tests Health Maintenance  Topic Date Due   COVID-19 Vaccine (7 - 2023-24 season) 03/25/2022 (Originally 02/06/2022)   INFLUENZA VACCINE  06/01/2022 (Originally 09/30/2021)   MAMMOGRAM  03/10/2023 (Originally 02/18/2022)   Medicare Annual Wellness (AWV)  03/10/2023   COLONOSCOPY (Pts 45-469yrInsurance coverage will need to be confirmed)  02/06/2025   DTaP/Tdap/Td (3 - Td or Tdap) 03/03/2025   Pneumonia Vaccine 6551Years old  Completed   DEXA SCAN  Completed   Hepatitis C Screening  Completed   Zoster Vaccines- Shingrix  Completed   HPV VACCINES  Aged Out    Health Maintenance  There are no preventive care reminders to display for this patient.   Colorectal cancer screening: Type of screening: Colonoscopy. Completed 02/07/20. Repeat every 5 years  Mammogram status: Ordered Scheduled 03/10/21. Pt provided with contact info and advised to call to schedule appt.   Bone Density status: Completed 05/05/19. Results  reflect: Bone density results: OSTEOPOROSIS. Repeat every   years.  Lung Cancer Screening: (Low Dose CT Chest recommended if Age 75-80ears, 30 pack-year currently smoking OR have quit w/in 15years.) does not qualify.     Additional Screening:  Hepatitis C Screening: does qualify; Completed 05/27/15  Vision Screening: Recommended annual  ophthalmology exams for early detection of glaucoma and other disorders of the eye. Is the patient up to date with their annual eye exam?  Yes  Who is the provider or what is the name of the office in which the patient attends annual eye exams? Dr Katy Fitch If pt is not established with a provider, would they like to be referred to a provider to establish care? No .   Dental Screening: Recommended annual dental exams for proper oral hygiene  Community Resource Referral / Chronic Care Management:  CRR required this visit?  No   CCM required this visit?  No      Plan:     I have personally reviewed and noted the following in the patient's chart:   Medical and social history Use of alcohol, tobacco or illicit drugs  Current medications and supplements including opioid prescriptions. Patient is not currently taking opioid prescriptions. Functional ability and status Nutritional status Physical activity Advanced directives List of other physicians Hospitalizations, surgeries, and ER visits in previous 12 months Vitals Screenings to include cognitive, depression, and falls Referrals and appointments  In addition, I have reviewed and discussed with patient certain preventive protocols, quality metrics, and best practice recommendations. A written personalized care plan for preventive services as well as general preventive health recommendations were provided to patient.     Criselda Peaches, LPN   11/05/7471   Nurse Notes: None

## 2022-03-10 LAB — HM MAMMOGRAPHY

## 2022-03-25 ENCOUNTER — Ambulatory Visit: Payer: PPO | Attending: Cardiology

## 2022-03-25 DIAGNOSIS — E785 Hyperlipidemia, unspecified: Secondary | ICD-10-CM

## 2022-03-26 LAB — APOLIPOPROTEIN B: Apolipoprotein B: 82 mg/dL (ref <90)

## 2022-03-26 LAB — LIPID PANEL
Chol/HDL Ratio: 2.7 ratio (ref 0.0–4.4)
Cholesterol, Total: 180 mg/dL (ref 100–199)
HDL: 66 mg/dL (ref >39)
LDL Chol Calc (NIH): 102 mg/dL — ABNORMAL HIGH (ref 0–99)
Triglycerides: 65 mg/dL (ref 0–149)
VLDL Cholesterol Cal: 12 mg/dL (ref 5–40)

## 2022-03-27 ENCOUNTER — Telehealth: Payer: PPO

## 2022-04-01 ENCOUNTER — Ambulatory Visit (INDEPENDENT_AMBULATORY_CARE_PROVIDER_SITE_OTHER): Payer: PPO | Admitting: Family Medicine

## 2022-04-01 ENCOUNTER — Encounter: Payer: Self-pay | Admitting: Family Medicine

## 2022-04-01 VITALS — BP 116/66 | HR 79 | Temp 98.3°F | Ht 67.0 in | Wt 128.2 lb

## 2022-04-01 DIAGNOSIS — H6123 Impacted cerumen, bilateral: Secondary | ICD-10-CM

## 2022-04-01 DIAGNOSIS — Z79899 Other long term (current) drug therapy: Secondary | ICD-10-CM

## 2022-04-01 NOTE — Progress Notes (Signed)
Established Patient Office Visit  Subjective   Patient ID: Brittany [name], female    DOB: 1945/04/11  Age: 77 y.o. MRN: [id number]  Chief Complaint  Patient presents with   Ear Fullness    Patient complains of left ear fullness, x1 week     HPI   Brittany [name] seen with left ear fullness for the past week.  She used a Q-tip to try to clear this out and feels like that may have packed things worse.  No ear pain.  No drainage.  Denies any right ear symptoms.  No vertigo.  She was concerned because she is going to [employer] in about a week.  Left great toenail dysmorphic changes.  She would like to consider treatment options.  Past Medical History:  Diagnosis Date   A-fib ([place])    Acid reflux    Acute cystitis with positive culture 03/06/2014   Anemia    Anxiety    Arthritis    Baker cyst, left 04/06/2016   Aspirated 04/06/2016   Breast cancer ([place]) 11/23/2007   right, ER/PR -, HER 2 -   Cognitive deficits 09/25/2014   2016 Very mild, could be related to meds    Degenerative arthritis of left knee 04/06/2016   Injection and aspiration 04/06/2016   Degenerative arthritis of right knee 09/27/2015   Depression    Esophageal stricture    Gastritis 10/2007   History of chemotherapy    neoadjuvant chemo, last dose 03/2008   History of radiation therapy 07/25/08 -09/11/08   right breast   Hx of colonic polyps 08/11/2016   Hyperlipidemia    Migraines    rare   Myalgia 07/18/2013   5/15 multiple site    OSA (obstructive sleep apnea)    severe OSA AHI 34, intolerant to PAP and now s/p hypoglossal nerve stimulator implant   Osteopenia    Rupture of quadriceps tendon 03/23/2017   Past Surgical History:  Procedure Laterality Date   ATRIAL FIBRILLATION ABLATION N/A 10/07/2018   Procedure: ATRIAL FIBRILLATION ABLATION;  Surgeon: [name], MD;  Location: [facility];  Service: Cardiovascular;  Laterality: N/A;   BREAST LUMPECTOMY  06/26/2008   right, CA    XRT, chemo   COLONOSCOPY     DRUG INDUCED ENDOSCOPY N/A 02/21/2020   Procedure: DRUG INDUCED ENDOSCOPY;  Surgeon: Melida Quitter, MD;  Location: [facility];  Service: ENT;  Laterality: N/A;   EYE SURGERY Bilateral    growth removed per right thigh     as teen - mole   IMPLANTATION OF HYPOGLOSSAL NERVE STIMULATOR N/A 05/15/2020   Procedure: IMPLANTATION OF HYPOGLOSSAL NERVE STIMULATOR;  Surgeon: Melida Quitter, MD;  Location: [facility];  Service: ENT;  Laterality: N/A;   JOINT REPLACEMENT  12/31/2015   RTK   QUADRICEPS TENDON REPAIR Right 09/14/2016   Procedure: OPEN REPAIR QUADRICEP TENDON;  Surgeon: [name], MD;  [facility];  Service: Orthopedics;  Laterality: Right;   TOTAL KNEE ARTHROPLASTY Right 12/31/2015   Procedure: RIGHT TOTAL KNEE ARTHROPLASTY;  Surgeon: [name], MD;  [facility];  Service: Orthopedics;  Laterality: Right;   TOTAL KNEE ARTHROPLASTY Left 10/11/2020   Procedure: TOTAL KNEE ARTHROPLASTY;  Surgeon: [name], MD;  Location: [facility];  Service: Orthopedics;  Laterality: Left;   UPPER GASTROINTESTINAL ENDOSCOPY      reports that she quit smoking about 55 years ago. Her smoking use included cigarettes. She has a 1.25 pack-year smoking  history. She has never used smokeless tobacco. She reports current alcohol use of about 7.0 standard drinks of alcohol per week. She reports that she does not use drugs. family history includes Breast cancer (age of onset: 36) in her sister; Cancer in her father; Colon cancer (age of onset: 22) in her sister; Dementia in her mother; Epilepsy in her mother; Heart disease in her father; Hip fracture in her mother; Other in her brother and maternal grandfather. Allergies  Allergen Reactions   Boniva [Ibandronic Acid] Nausea Only   Hydrocodone-Acetaminophen Other (See Comments)    "Does not relieve pain", patient reported.  Can take Tylenol.   Lipitor [Atorvastatin] Other (See Comments)    Muscle and joint pain(s)    Pt request removal of this intolerance   Tramadol Other (See Comments)    "Does not help pain."- patient reported.    Review of Systems  Constitutional:  Negative for chills and fever.  HENT:  Negative for ear pain.   Neurological:  Negative for dizziness.      Objective:     BP 116/66 (BP Location: Left Arm, Patient Position: Sitting, Cuff Size: Normal)   Pulse 79   Temp 98.3 F (36.8 C) (Oral)   Ht '5\' 7"'$  (1.702 m)   Wt 128 lb 3.2 oz (58.2 kg)   LMP 03/02/1994 (Approximate)   SpO2 98%   BMI 20.08 kg/m    Physical Exam Vitals reviewed.  HENT:     Ears:     Comments: Cerumen impaction bilaterally. Cardiovascular:     Rate and Rhythm: Normal rate and regular rhythm.  Pulmonary:     Effort: Pulmonary effort is normal.     Breath sounds: Normal breath sounds.  Skin:    Comments: Left great toe- nail is diffusely discolored at the base with some thickened and brittle changes.    Neurological:     Mental Status: She is alert.      No results found for any visits on 04/01/22.    The 10-year ASCVD risk score (Arnett DK, et al., 2019) is: 11.9%    Assessment & Plan:   #1 Bilateral cerumen impactions.  Left ear more symptomatic than right.  We discussed risk of ear irrigation including risk of pain, bleeding, low risk of perforation and patient consented. Both ears were irrigated with removal of cerumen.  Patient tolerated well.    #2 dystrophic left great toenail   suspect onychomycosis.   Discuss possible use of oral Lamisil.   Check hepatic panel.  If normal, start Lamisil 250 mg once daily for 90 days.   Reviewed possible side effects including low risk of hepatic injury.     Carolann Littler, MD

## 2022-04-02 ENCOUNTER — Other Ambulatory Visit (INDEPENDENT_AMBULATORY_CARE_PROVIDER_SITE_OTHER): Payer: PPO

## 2022-04-02 DIAGNOSIS — Z79899 Other long term (current) drug therapy: Secondary | ICD-10-CM | POA: Diagnosis not present

## 2022-04-03 LAB — HEPATIC FUNCTION PANEL
ALT: 9 U/L (ref 0–35)
AST: 15 U/L (ref 0–37)
Albumin: 4.3 g/dL (ref 3.5–5.2)
Alkaline Phosphatase: 68 U/L (ref 39–117)
Bilirubin, Direct: 0 mg/dL (ref 0.0–0.3)
Total Bilirubin: 0.3 mg/dL (ref 0.2–1.2)
Total Protein: 7.1 g/dL (ref 6.0–8.3)

## 2022-04-10 MED ORDER — TERBINAFINE HCL 250 MG PO TABS: 250.0000 mg | ORAL_TABLET | Freq: Every day | ORAL | 0 refills | Status: DC

## 2022-04-10 NOTE — Progress Notes (Signed)
Lamisil sent  Eulas Post MD [facility]

## 2022-04-14 ENCOUNTER — Encounter: Payer: Self-pay | Admitting: Family Medicine

## 2022-04-14 ENCOUNTER — Ambulatory Visit (INDEPENDENT_AMBULATORY_CARE_PROVIDER_SITE_OTHER): Payer: PPO | Admitting: Family Medicine

## 2022-04-14 VITALS — BP 120/82 | HR 62 | Temp 98.1°F | Ht 67.0 in | Wt 129.8 lb

## 2022-04-14 DIAGNOSIS — R6889 Other general symptoms and signs: Secondary | ICD-10-CM | POA: Diagnosis not present

## 2022-04-14 DIAGNOSIS — J069 Acute upper respiratory infection, unspecified: Secondary | ICD-10-CM | POA: Diagnosis not present

## 2022-04-14 LAB — POCT INFLUENZA A/B
Influenza A, POC: NEGATIVE
Influenza B, POC: NEGATIVE

## 2022-04-14 LAB — POC COVID19 BINAXNOW: SARS Coronavirus 2 Ag: NEGATIVE

## 2022-04-14 MED ORDER — METHYLPREDNISOLONE 4 MG PO TBPK: ORAL_TABLET | ORAL | 0 refills | Status: DC

## 2022-04-14 MED ORDER — BENZONATATE 100 MG PO CAPS: 100.0000 mg | ORAL_CAPSULE | Freq: Two times a day (BID) | ORAL | 0 refills | Status: DC | PRN

## 2022-04-14 NOTE — Progress Notes (Signed)
Acute Office Visit  Subjective:     Patient ID: Brittany [name], female    DOB: 12/10/1945, 77 y.o.   MRN: [id number]  Chief Complaint  Patient presents with   Cough    Non-productive x4 days, seen at an urgent care and given Xyzal    Cough   Patient is in today for increasing cough, pt states that she was supposed to have surgery today but she felt she was "coming down with something" state that she flew back from [place] 5 days ago, thought she caught something on the plane. Cough is dry, no nasal congestion or drainage, no fever or chills. States that she feels kinda tight in her chest. Some pain in the back of her throat, no ear pain. Pt tested for COVID and it was negative, 3 negative tests. Has been taking the xyzal they gave her at urgent care  But it is not helping.   Review of Systems  Respiratory:  Positive for cough.   All other systems reviewed and are negative.       Objective:    BP 120/82 (BP Location: Left Arm, Patient Position: Sitting, Cuff Size: Normal)   Pulse 62   Temp 98.1 F (36.7 C) (Oral)   Ht 5' 7"$  (1.702 m)   Wt 129 lb 12.8 oz (58.9 kg)   LMP 03/02/1994 (Approximate)   SpO2 98%   BMI 20.33 kg/m    Physical Exam Constitutional:      Appearance: Normal appearance. She is normal weight.  HENT:     Right Ear: Tympanic membrane and ear canal normal.     Left Ear: Tympanic membrane and ear canal normal.  Cardiovascular:     Rate and Rhythm: Normal rate and regular rhythm.     Pulses: Normal pulses.     Heart sounds: Normal heart sounds.  Pulmonary:     Effort: Pulmonary effort is normal.     Breath sounds: Normal breath sounds. No wheezing, rhonchi or rales.  Abdominal:     General: Bowel sounds are normal.  Neurological:     Mental Status: She is alert and oriented to person, place, and time. Mental status is at baseline.     Results for orders placed or performed in visit on 04/14/22  POC COVID-19 BinaxNow  Result Value Ref Range    SARS Coronavirus 2 Ag Negative Negative  POCT Influenza A/B  Result Value Ref Range   Influenza A, POC Negative Negative   Influenza B, POC Negative Negative        Assessment & Plan:   Problem List Items Addressed This Visit   None Visit Diagnoses     Flu-like symptoms    -  Primary   Relevant Orders   POC COVID-19 BinaxNow (Completed)   POCT Influenza A/B (Completed)   Viral upper respiratory tract infection       Relevant Medications   benzonatate (TESSALON) 100 MG capsule   methylPREDNISolone (MEDROL DOSEPAK) 4 MG TBPK tablet      Most likely viral in origin, patient has failed OTC medications and xyzal, will treat with a medrol dose pak to reduce inflammation and mucu production, then PRN benzonatate 100 mg BID PRN to reduce cough. Meds ordered this encounter  Medications   benzonatate (TESSALON) 100 MG capsule    Sig: Take 1 capsule (100 mg total) by mouth 2 (two) times daily as needed for cough.    Dispense:  20 capsule    Refill:  0   methylPREDNISolone (MEDROL DOSEPAK) 4 MG TBPK tablet    Sig: Take package as directed.    Dispense:  21 each    Refill:  0    No follow-ups on file.  Farrel Conners, MD

## 2022-04-22 ENCOUNTER — Telehealth: Payer: Self-pay

## 2022-04-22 NOTE — Progress Notes (Unsigned)
Care Management & Coordination Services Pharmacy Team  Reason for Encounter: General adherence update   Contacted patient for general health update and medication adherence call.  {US HC Outreach:28874}   What concerns do you have about your medications?  The patient {denies/reports:25180} side effects with their medications.   How often do you forget or accidentally miss a dose? {missed doses:25554}  Do you use a pillbox? {yes/no:20286}  Are you having any problems getting your medications from your pharmacy? {yes/no:20286}  Has the cost of your medications been a concern? {yes/no:20286} If yes, what medication and is patient assistance available or has it been applied for?  Since last visit with PharmD, {no/thefollowing:25210} interventions have been made.   The patient {has/has not:25209} had an ED visit since last contact.   The patient {denies/reports:25180} problems with their health.   Patient {denies/reports:25180} concerns or questions for ***, PharmD at this time.   Counseled patient on: {GENERALCOUNSELING:28686}   Chart Updates:  Recent office visits:  Loralyn Freshwater MD - Patient was seen for flu like symptoms and an additional concern. Started Benzonatate 100 mg twice daily prn and Methylprednisolone 4 mg.   04/01/2022 Carolann Littler MD - Patient was seen for Bilateral impacted cerumen and an additional concern. No medication changes.   03/09/2022 Rolene Arbour LPN - Medicare annual wellness exam.  11/10/2021 Garret Reddish MD - Patient was seen for covid 19 and an additional concern. Started Benzonatate 100 mg twice daily prn and Paxlovid. Discontinued Sodium Chloride.   10/27/2021 Loralyn Freshwater MD - Patient was seen for anxiety state and additional concerns. No medication changes.   Recent consult visits:  04/11/2022 [facility] - Patient was seen for pruritic rash and additional concern. No additional chart notes.    02/16/2022 Callie Sarajane Jews PA-C (cardiology) - Patient was seen for preop cardiovascular exam. No medication changes.  02/06/2022 Wylene Simmer MD ([facility]) - Patient was seen for  Acquired right hallux varus and additional concerns.   01/16/2022 [name] MD (cardiology) - Patient was seen for OSA - Sleep study. No medication changes.   12/17/2021 Ila Mcgill DPM - Patient was seen for post operative state and an additional concern. No medication changes.   12/15/2021 Reatha Harps [facility] - Patient was seen for Hyperlipidemia, unspecified hyperlipidemia type and Dyslipidemia. Started Rosuvastatin 5 mg daily.  12/05/2021 Nicholas Lose MD (hem / onc) - Patient was seen for Malignant neoplasm of overlapping sites of right breast in female, estrogen receptor positive. No medication changes.   11/06/2021 [name] MD (cardiology) - Patient was seen for OSA on CPAP and an additional concern. No medication changes.   11/05/2021 Ila Mcgill DPM - Patient was seen for post operative state. No medication changes.   03/81/2023 Christen Bame NP (cardiology) - Patient was seen for Paroxysmal atrial fibrillation and additional concerns. No medication changes.   Hospital visits:  None  Medications: Outpatient Encounter Medications as of 04/22/2022  Medication Sig   acetaminophen (TYLENOL) 500 MG tablet Take 500 mg by mouth every 6 (six) hours as needed.   benzonatate (TESSALON) 100 MG capsule Take 1 capsule (100 mg total) by mouth 2 (two) times daily as needed for cough.   Biotin 5000 MCG TABS Take 5,000 mcg by mouth daily.   Calcium-Magnesium-Zinc (CAL-MAG-ZINC PO) Take 1 tablet by mouth daily. 600 mg   Cholecalciferol (VITAMIN D) 50 MCG (2000 UT) tablet Take 2,000 Units by mouth daily.   citalopram (CELEXA) 40 MG tablet Take 1  tablet (40 mg total) by mouth daily.   Cyanocobalamin (VITAMIN B-12 PO) Take 2,500 mcg by mouth daily.   denosumab (PROLIA) 60 MG/ML SOSY injection Inject 60 mg  into the skin every 6 (six) months.   diclofenac Sodium (VOLTAREN) 1 % GEL Apply 2 g topically 4 (four) times daily.   diphenhydrAMINE (BENADRYL) 25 MG tablet Take 25 mg by mouth every 6 (six) hours as needed for allergies.   Evolocumab (REPATHA SURECLICK) [id number] MG/ML SOAJ Inject 140 mg into the skin every 14 (fourteen) days.   famotidine (PEPCID) 40 MG tablet Take 1 tablet (40 mg total) by mouth daily.   fluticasone (FLONASE) 50 MCG/ACT nasal spray SPRAY 2 SPRAYS INTO EACH NOSTRIL EVERY DAY   methylPREDNISolone (MEDROL DOSEPAK) 4 MG TBPK tablet Take package as directed.   Omega-3 Fatty Acids (FISH OIL) 1200 MG CAPS Take 1,200 mg by mouth daily.   Probiotic Product (PROBIOTIC DAILY PO) Take 1 tablet by mouth daily.   SUMAtriptan (IMITREX) 100 MG tablet Take 1 tablet (100 mg total) by mouth once for 1 dose. Can repeat dose in 2 hours if symptoms persist. Max dose 2 tablets in 24 hours.   terbinafine (LAMISIL) 250 MG tablet Take 1 tablet (250 mg total) by mouth daily.   zolpidem (AMBIEN) 5 MG tablet Take 0.5-1 tablets (2.5-5 mg total) by mouth at bedtime as needed for sleep.   No facility-administered encounter medications on file as of 04/22/2022.  Fill History:   Dispensed Days Supply Quantity Provider Pharmacy  CITALOPRAM HBR 40 MG TABLET 02/02/2022 90 90 each      Dispensed Days Supply Quantity Provider Pharmacy  REPATHA [id number] MG/ML SURECLICK [id number] 84 6 mL      Dispensed Days Supply Quantity Provider Pharmacy  FAMOTIDINE 40 MG TABLET 02/02/2022 90 90 each      Dispensed Days Supply Quantity Provider Pharmacy  FLUTICASONE PROP 50 MCG SPRAY 03/23/2022 90 48 g      Dispensed Days Supply Quantity Provider Pharmacy  SUMATRIPTAN SUCC 100 MG TABLET 12/28/2021 30 9 each      Dispensed Days Supply Quantity Provider Pharmacy  TERBINAFINE HCL 250 MG TABLET 04/10/2022 30 30 each      Dispensed Days Supply Quantity Provider Pharmacy  ZOLPIDEM TARTRATE 5 MG TABLET 10/27/2021 90 90 each       Recent vitals BP Readings from Last 3 Encounters:  04/14/22 120/82  04/01/22 116/66  12/05/21 125/61   Pulse Readings from Last 3 Encounters:  04/14/22 62  04/01/22 79  12/05/21 79   Wt Readings from Last 3 Encounters:  04/14/22 129 lb 12.8 oz (58.9 kg)  04/01/22 128 lb 3.2 oz (58.2 kg)  03/09/22 128 lb (58.1 kg)   BMI Readings from Last 3 Encounters:  04/14/22 20.33 kg/m  04/01/22 20.08 kg/m  03/09/22 20.05 kg/m    Recent lab results    Component Value Date/Time   NA 136 12/05/2021 1120   NA 138 11/06/2021 0946   NA 136 02/16/2017 1315   K 4.4 12/05/2021 1120   K 4.1 02/16/2017 1315   CL 101 12/05/2021 1120   CL 98 03/21/2012 1053   CO2 32 12/05/2021 1120   CO2 26 02/16/2017 1315   GLUCOSE 99 12/05/2021 1120   GLUCOSE 83 02/16/2017 1315   GLUCOSE 84 03/21/2012 1053   BUN 10 12/05/2021 1120   BUN 8 11/06/2021 0946   BUN 10.4 02/16/2017 1315   CREATININE 0.65 12/05/2021 1120   CREATININE 0.7 02/16/2017 1315  CALCIUM 9.5 12/05/2021 1120   CALCIUM 9.2 02/16/2017 1315    Lab Results  Component Value Date   CREATININE 0.65 12/05/2021   GFR 88.91 09/25/2020   EGFR 91 11/06/2021   GFRNONAA >60 12/05/2021   GFRAA >60 09/19/2019   No results found for: "HGBA1C", "FRUCTOSAMINE", "MICROALBUR"  Lab Results  Component Value Date   CHOL 180 03/25/2022   HDL 66 03/25/2022   LDLCALC 102 (H) 03/25/2022   LDLDIRECT 178.9 03/23/2013   TRIG 65 03/25/2022   CHOLHDL 2.7 03/25/2022    Care Gaps: AWV - completed 03/09/2022, scheduled 03/17/2023 Next appointment - 10/05/2022 Covid - postponed Flu - postponed   Star Rating Drug: None  [place] Pharmacist Assistant [phone]

## 2022-04-27 ENCOUNTER — Encounter: Payer: Self-pay | Admitting: Cardiology

## 2022-04-27 ENCOUNTER — Ambulatory Visit: Payer: PPO | Attending: Cardiology | Admitting: Cardiology

## 2022-04-27 VITALS — BP 120/66 | HR 93 | Ht 67.0 in | Wt 130.0 lb

## 2022-04-27 DIAGNOSIS — G4733 Obstructive sleep apnea (adult) (pediatric): Secondary | ICD-10-CM | POA: Diagnosis not present

## 2022-04-27 NOTE — Patient Instructions (Signed)
Medication Instructions:  Your physician recommends that you continue on your current medications as directed. Please refer to the Current Medication list given to you today.  *If you need a refill on your cardiac medications before your next appointment, please call your pharmacy*   Follow-Up: At [facility], you and your health needs are our priority.  As part of our continuing mission to provide you with exceptional heart care, we have created designated Provider Care Teams.  These Care Teams include your primary Cardiologist (physician) and Advanced Practice Providers (APPs -  Physician Assistants and Nurse Practitioners) who all work together to provide you with the care you need, when you need it.   Your next appointment:   6 month(s)  Provider:   Dr Radford Pax

## 2022-04-27 NOTE — Progress Notes (Unsigned)
Date:  04/27/2022   ID:  Brittany [name], DOB 1945/04/24, MRN [id number] The patient was identified using 2 identifiers.  PCP:  Farrel Conners, MD   [facility] Providers Cardiologist:  Candee Furbish, MD Electrophysiologist:  Will Meredith Leeds, MD  Sleep Medicine:  [name], MD     Evaluation Performed:  Follow-Up Visit  Chief Complaint:  OSA  History of Present Illness:    Brittany [name] is a 77 y.o. female with who is followed by Dr. Marlou Porch for atrial fibrillation who was referred for sleep study.  She underwent home sleep study which showed severe obstructive sleep apnea with an AHI of 34.4/h and moderate oxygen desaturations to 81%.  She underwent CPAP titration to 12 cm H2O.   She was referred her to ENT for evaluation for the hypoglossal nerve stimulator.  She underwent Inspire device implantation 05/15/2020. She underwent in lab titration 01/09/2021 with a therapeutic amplitude of 1.4V with an AHI of 7.5/hr and O2 sats as low as 88%.  Her device was programmed to amplitude of 1.3V   She is now here for evaluation and is doing great today.  She is currently on amplitude of 1.5 V and is doing great.  She sleeps great through the night and feels rested in the morning with no excessive daytime sleepiness.  She is sleeping around 7 hours nightly.  She has not had any tongue sensitivity or sore throat.  At last OV her sleep study revealed that her AHI jumped to 8.9/h.  Her level was adjusted to a range of 1.3>>1.8.  Repeat sleep study on 1.5V (level 3) showed an AHI of 1.3/hr.  She is currently on level 3 which is 1.5V.   Her husband says that she is snoring come.  Past Medical History:  Diagnosis Date   A-fib ([place])    Acid reflux    Acute cystitis with positive culture 03/06/2014   Anemia    Anxiety    Arthritis    Baker cyst, left 04/06/2016   Aspirated 04/06/2016   Breast cancer ([place]) 11/23/2007   right, ER/PR -, HER 2 -   Cognitive deficits 09/25/2014   2016 Very  mild, could be related to meds    Degenerative arthritis of left knee 04/06/2016   Injection and aspiration 04/06/2016   Degenerative arthritis of right knee 09/27/2015   Depression    Esophageal stricture    Gastritis 10/2007   History of chemotherapy    neoadjuvant chemo, last dose 03/2008   History of radiation therapy 07/25/08 -09/11/08   right breast   Hx of colonic polyps 08/11/2016   Hyperlipidemia    Migraines    rare   Myalgia 07/18/2013   5/15 multiple site    OSA (obstructive sleep apnea)    severe OSA AHI 34, intolerant to PAP and now s/p hypoglossal nerve stimulator implant   Osteopenia    Rupture of quadriceps tendon 03/23/2017   Past Surgical History:  Procedure Laterality Date   ATRIAL FIBRILLATION ABLATION N/A 10/07/2018   Procedure: ATRIAL FIBRILLATION ABLATION;  Surgeon: [name], MD;  Location: [facility];  Service: Cardiovascular;  Laterality: N/A;   BREAST LUMPECTOMY  06/26/2008   right, CA   XRT, chemo   COLONOSCOPY     DRUG INDUCED ENDOSCOPY N/A 02/21/2020   Procedure: DRUG INDUCED ENDOSCOPY;  Surgeon: Melida Quitter, MD;  Location: [facility];  Service: ENT;  Laterality: N/A;   EYE SURGERY Bilateral  growth removed per right thigh     as teen - mole   IMPLANTATION OF HYPOGLOSSAL NERVE STIMULATOR N/A 05/15/2020   Procedure: IMPLANTATION OF HYPOGLOSSAL NERVE STIMULATOR;  Surgeon: Melida Quitter, MD;  Location[facility];  Service: ENT;  Laterality: N/A;   JOINT REPLACEMENT  12/31/2015   RTK   QUADRICEPS TENDON REPAIR Right 09/14/2016   Procedure: OPEN REPAIR QUADRICEP TENDON;  Surgeon: [name], MD;  Location: [facility];  Service: Orthopedics;  Laterality: Right;   TOTAL KNEE ARTHROPLASTY Right 12/31/2015   Procedure: RIGHT TOTAL KNEE ARTHROPLASTY;  Surgeon: [name], MD;  Location: [facility];  Service: Orthopedics;  Laterality: Right;   TOTAL KNEE ARTHROPLASTY Left 10/11/2020   Procedure: TOTAL KNEE ARTHROPLASTY;   Surgeon: [name], MD;  Location: [facility];  Service: Orthopedics;  Laterality: Left;   UPPER GASTROINTESTINAL ENDOSCOPY       No outpatient medications have been marked as taking for the 04/27/22 encounter (Appointment) with [name], MD.     Allergies:   Boniva [ibandronic acid], Hydrocodone-acetaminophen, Lipitor [atorvastatin], and Tramadol   Social History   Tobacco Use   Smoking status: Former    Packs/day: 0.25    Years: 5.00    Total pack years: 1.25    Types: Cigarettes    Quit date: 03/03/1967    Years since quitting: 55.1   Smokeless tobacco: Never   Tobacco comments:    6 cig per day when she was smoking/quit years ago  Vaping Use   Vaping Use: Never used  Substance Use Topics   Alcohol use: Yes    Alcohol/week: 7.0 standard drinks of alcohol    Types: 7 Cans of beer per week    Comment: 1 beer per day - 2.4 % alcholol - [employer] 55   Drug use: No     Family Hx: The patient's family history includes Breast cancer (age of onset: 3) in her sister; Cancer in her father; Colon cancer (age of onset: 85) in her sister; Dementia in her mother; Epilepsy in her mother; Heart disease in her father; Hip fracture in her mother; Other in her brother and maternal grandfather. There is no history of Esophageal cancer or Stomach cancer.  ROS:   Please see the history of present illness.    none All other systems reviewed and are negative.   Prior CV studies:   The following studies were reviewed today:  none  Labs/Other Tests and Data Reviewed:    EKG:  No ECG reviewed.  Recent Labs: 12/05/2021: BUN 10; Creatinine 0.65; Hemoglobin 13.6; Platelet Count 296; Potassium 4.4; Sodium 136 04/02/2022: ALT 9   Recent Lipid Panel Lab Results  Component Value Date/Time   CHOL 180 03/25/2022 08:35 AM   TRIG 65 03/25/2022 08:35 AM   HDL 66 03/25/2022 08:35 AM   CHOLHDL 2.7 03/25/2022 08:35 AM   CHOLHDL 4 09/25/2020 10:04 AM   LDLCALC 102 (H) 03/25/2022 08:35 AM    LDLDIRECT 178.9 03/23/2013 10:59 AM    Wt Readings from Last 3 Encounters:  04/14/22 129 lb 12.8 oz (58.9 kg)  04/01/22 128 lb 3.2 oz (58.2 kg)  03/09/22 128 lb (58.1 kg)     Objective:    Vital Signs:  LMP 03/02/1994 (Approximate)   GEN: Well nourished, well developed in no acute distress HEENT: Normal NECK: No JVD; No carotid bruits LYMPHATICS: No lymphadenopathy CARDIAC:RRR, no murmurs, rubs, gallops RESPIRATORY:  Clear to auscultation without rales, wheezing or rhonchi  ABDOMEN: Soft,  non-tender, non-distended MUSCULOSKELETAL:  No edema; No deformity  SKIN: Warm and dry NEUROLOGIC:  Alert and oriented x 3 PSYCHIATRIC:  Normal affect  ASSESSMENT & PLAN:    1.  OSA  -she has severe OSA intolerant to PAP therapy -she is now s/p Hypoglossal nerve stimulator implant by Dr. Redmond Baseman -her device was activated several months ago with the following programming:              -Sensation threshold 0.5V             -Functional threshold 0.6V -programmed with the following parameters:             -Pulse width 15m             -rate '33Hz'$              -Start delay 358m             -Pause time 1531m            -Therapy duration 8hr             -Electrodes +-+             -Stimulation settings 0.6-1.6V -she was doing well after 4 weeks of using her device -she had significant improvement in her daytime sleepiness and is not snoring -she underwent in lab titration 01/09/2021 with a therapeutic amplitude of 1.4V with an AHI of 7.5/hr and O2 sats as low as 88%.  Her device was programmed to amplitude of 1.3V and she is now up to 1.5 V using it and doing very well without any problems at all and no excessive daytime sleepiness.  She says she feels the best she has felt in a long time -Sleep lab data was reviewed and showed an AHI 1.6/h at 1.3 V but jumped to 8/h at 1.5 V.  She feels good though at 1.5 V. -she is doing well today and her last sleep study in Nov 23 on level 3 showed an AHI of  1.3/hr. Will continue on current settings -I encouraged her to avoid sleeping supine to help with snoring -followup with me in 6 weeks    Medication Adjustments/Labs and Tests Ordered: Current medicines are reviewed at length with the patient today.  Concerns regarding medicines are outlined above.  No orders of the defined types were placed in this encounter.  No orders of the defined types were placed in this encounter.    Medication Adjustments/Labs and Tests Ordered: Current medicines are reviewed at length with the patient today.  Concerns regarding medicines are outlined above.   Tests Ordered: No orders of the defined types were placed in this encounter.   Medication Changes: No orders of the defined types were placed in this encounter.   Follow Up:  After Inspire titration  Signed, TraFransico HimD  04/27/2022 8:27 AM    [place]

## 2022-04-28 LAB — HM DEXA SCAN

## 2022-04-29 ENCOUNTER — Encounter: Payer: Self-pay | Admitting: Family Medicine

## 2022-04-29 ENCOUNTER — Ambulatory Visit (INDEPENDENT_AMBULATORY_CARE_PROVIDER_SITE_OTHER): Payer: PPO | Admitting: Family Medicine

## 2022-04-29 VITALS — BP 110/62 | HR 111 | Temp 99.6°F | Ht 67.0 in | Wt 128.0 lb

## 2022-04-29 DIAGNOSIS — J209 Acute bronchitis, unspecified: Secondary | ICD-10-CM | POA: Diagnosis not present

## 2022-04-29 DIAGNOSIS — L501 Idiopathic urticaria: Secondary | ICD-10-CM | POA: Diagnosis not present

## 2022-04-29 DIAGNOSIS — G4700 Insomnia, unspecified: Secondary | ICD-10-CM | POA: Diagnosis not present

## 2022-04-29 DIAGNOSIS — J0100 Acute maxillary sinusitis, unspecified: Secondary | ICD-10-CM

## 2022-04-29 MED ORDER — ZOLPIDEM TARTRATE 5 MG PO TABS: 2.5000 mg | ORAL_TABLET | Freq: Every evening | ORAL | 0 refills | Status: DC | PRN

## 2022-04-29 MED ORDER — METHYLPREDNISOLONE ACETATE 80 MG/ML IJ SUSP
80.0000 mg | Freq: Once | INTRAMUSCULAR | Status: AC
  Administered 2022-04-29: 80 mg via INTRAMUSCULAR

## 2022-04-29 MED ORDER — GUAIFENESIN-CODEINE 100-10 MG/5ML PO SOLN: 5.0000 mL | Freq: Four times a day (QID) | ORAL | 0 refills | Status: DC | PRN

## 2022-04-29 MED ORDER — AMOXICILLIN-POT CLAVULANATE 875-125 MG PO TABS: 1.0000 | ORAL_TABLET | Freq: Two times a day (BID) | ORAL | 0 refills | Status: DC

## 2022-04-29 MED ORDER — TRIAMCINOLONE ACETONIDE 0.1 % EX CREA: 1.0000 | TOPICAL_CREAM | Freq: Two times a day (BID) | CUTANEOUS | 0 refills | Status: DC

## 2022-04-29 MED ORDER — METHYLPREDNISOLONE SODIUM SUCC 125 MG IJ SOLR: 125.0000 mg | Freq: Once | INTRAMUSCULAR | Status: DC

## 2022-04-29 NOTE — Progress Notes (Signed)
Established Patient Office Visit  Subjective   Patient ID: Brittany [name], female    DOB: 04-28-1945  Age: 77 y.o. MRN: [id number]  Chief Complaint  Patient presents with   Medical Management of Chronic Issues   Cough    Patient complains of recurrent non-productive cough since last visit, ran out of Benzonatate cough perles which did not help    Patient is reporting continued coughing since the last visit. States that the steroids did help initially but then the cough returned and the benzonatate did not help her coughing. States she had the dose pak refilled at the pharmacy but has not started the new dose pak yet. States that she continues to have severe nasal drainage, sore throat and sinus pressure.   Insomnia-- chronic, patient is taking 1/2 tablet at night of the 5 mg ambien, states it is working well for her at this dose, no side effects noted, needs refills today.   Hives-- pt gets these intermittently on her forearms. States that she usually uses triamcinolone 0.1% cream as needed to reduce itching. States she has one on her left wrist currently and one on her face. Is not sure what is causing them, no changes in lotions or detergent, no changes in diet or medications, states they resolve with steroid use.    Current Outpatient Medications  Medication Instructions   acetaminophen (TYLENOL) 500 mg, Oral, Every 6 hours PRN   amoxicillin-clavulanate (AUGMENTIN) 875-125 MG tablet 1 tablet, Oral, 2 times daily   Biotin 5,000 mcg, Oral, Daily   Calcium-Magnesium-Zinc (CAL-MAG-ZINC PO) 1 tablet, Oral, Daily, 600 mg    citalopram (CELEXA) 40 mg, Oral, Daily   Cyanocobalamin (VITAMIN B-12 PO) 2,500 mcg, Oral, Daily   denosumab (PROLIA) 60 mg, Subcutaneous, Every 6 months   diclofenac Sodium (VOLTAREN) 2 g, Topical, 4 times daily   diphenhydrAMINE (BENADRYL) 25 mg, Oral, Every 6 hours PRN   famotidine (PEPCID) 40 mg, Oral, Daily   Fish Oil 1,200 mg, Oral, Daily,     fluticasone  (FLONASE) 50 MCG/ACT nasal spray SPRAY 2 SPRAYS INTO EACH NOSTRIL EVERY DAY   guaiFENesin-codeine 100-10 MG/5ML syrup 5 mLs, Oral, Every 6 hours PRN   metoprolol tartrate (LOPRESSOR) 50 mg, Oral, As needed   Probiotic Product (PROBIOTIC DAILY PO) 1 tablet, Oral, Daily   Repatha SureClick [id number] mg, Subcutaneous, Every 14 days   SUMAtriptan (IMITREX) 100 mg, Oral,  Once, Can repeat dose in 2 hours if symptoms persist. Max dose 2 tablets in 24 hours.   terbinafine (LAMISIL) 250 mg, Oral, Daily   triamcinolone cream (KENALOG) 0.1 % 1 Application, Topical, 2 times daily   Vitamin D 2,000 Units, Oral, Daily   zolpidem (AMBIEN) 2.5-5 mg, Oral, At bedtime PRN    Patient Active Problem List   Diagnosis Date Noted   Chronic idiopathic urticaria 04/29/2022   OSA (obstructive sleep apnea) 01/09/2021   Status post total left knee replacement 10/11/2020   Primary osteoarthritis of left knee 10/02/2020   Atrial fibrillation ([place]) 05/13/2018   Bloating 04/11/2018   Abnormal EKG 08/23/2017   Malignant neoplasm of overlapping sites of right breast in female, estrogen receptor positive ([place]) 08/17/2016   Hx of colonic polyps 08/11/2016   Iron deficiency anemia 07/28/2016   Blood donor 07/28/2016   Pain due to total right knee replacement ([place]) 04/06/2016   S/P right TKA 12/31/2015   Baker's cyst, unruptured 09/27/2015   Insomnia 07/31/2015   Onychomycosis 09/25/2014   Urinary frequency 05/21/2014  Mitral valve prolapse 05/08/2014   Mitral regurgitation 05/08/2014   Dyslipidemia 02/09/2014   History of radiation therapy    Hiatal hernia    History of chemotherapy    Osteoporosis 03/21/2012   Seasonal and perennial allergic rhinitis 12/17/2011   B12 deficiency 10/09/2009   TOBACCO USE, QUIT 01/31/2009   THYROID NODULE 12/25/2008   GERD 11/23/2007   VARICOSE VEIN 12/29/2006   Anxiety state 12/23/2006   Depression 12/23/2006   Migraine variant 12/23/2006   Disorder of bone and cartilage  12/23/2006      Review of Systems  All other systems reviewed and are negative.     Objective:     BP 110/62 (BP Location: Left Arm, Patient Position: Sitting, Cuff Size: Normal)   Pulse (!) 111   Temp 99.6 F (37.6 C) (Oral)   Ht '5\' 7"'$  (1.702 m)   Wt 128 lb (58.1 kg)   LMP 03/02/1994 (Approximate)   SpO2 97%   BMI 20.05 kg/m  BP Readings from Last 3 Encounters:  04/29/22 110/62  04/27/22 120/66  04/14/22 120/82      Physical Exam Vitals reviewed.  Constitutional:      Appearance: Normal appearance. She is well-groomed and normal weight.  Eyes:     Conjunctiva/sclera: Conjunctivae normal.  Cardiovascular:     Rate and Rhythm: Normal rate and regular rhythm.     Pulses: Normal pulses.     Heart sounds: S1 normal and S2 normal.  Pulmonary:     Effort: Pulmonary effort is normal.     Breath sounds: Normal air entry. Wheezing present.  Abdominal:     General: Bowel sounds are normal.  Musculoskeletal:     Right lower leg: No edema.     Left lower leg: No edema.  Neurological:     Mental Status: She is alert and oriented to person, place, and time. Mental status is at baseline.     Gait: Gait is intact.  Psychiatric:        Mood and Affect: Mood and affect normal.        Speech: Speech normal.        Behavior: Behavior normal.        Judgment: Judgment normal.      No results found for any visits on 04/29/22.    The 10-year ASCVD risk score (Arnett DK, et al., 2019) is: 10.8%    Assessment & Plan:   Problem List Items Addressed This Visit       Unprioritized   Insomnia    Continue current dose of ambien for sleep. She has been on this medication long term and we continue to monitor her for signs of adverse effects. So far she is tolerating it well without any side effects. New rx sent to pharmacy      Relevant Medications   zolpidem (AMBIEN) 5 MG tablet   Chronic idiopathic urticaria   Relevant Medications   triamcinolone cream (KENALOG) 0.1  %   Other Visit Diagnoses     Acute bronchitis, unspecified organism    -  Primary   persistnet wheezing with forced expiration, will treat with another round of medrol dose pak which she alreayd has filled. will give depmedrol 80 mg IM x 1.   Relevant Medications   guaiFENesin-codeine 100-10 MG/5ML syrup   methylPREDNISolone acetate (DEPO-MEDROL) injection 80 mg (Completed)   Acute non-recurrent maxillary sinusitis       >2 weeks of illness with progressive worsening of symptoms, will treat iwth  augmentin 875 mg BID for 10 days, PRN cough syrup also written for patient.   Relevant Medications   amoxicillin-clavulanate (AUGMENTIN) 875-125 MG tablet   guaiFENesin-codeine 100-10 MG/5ML syrup   methylPREDNISolone acetate (DEPO-MEDROL) injection 80 mg (Completed)      Meds ordered this encounter  Medications   amoxicillin-clavulanate (AUGMENTIN) 875-125 MG tablet    Sig: Take 1 tablet by mouth 2 (two) times daily.    Dispense:  20 tablet    Refill:  0   zolpidem (AMBIEN) 5 MG tablet    Sig: Take 0.5-1 tablets (2.5-5 mg total) by mouth at bedtime as needed for sleep.    Dispense:  90 tablet    Refill:  0   DISCONTD: methylPREDNISolone sodium succinate (SOLU-MEDROL) 125 mg/2 mL injection 125 mg   guaiFENesin-codeine 100-10 MG/5ML syrup    Sig: Take 5 mLs by mouth every 6 (six) hours as needed for cough.    Dispense:  180 mL    Refill:  0   triamcinolone cream (KENALOG) 0.1 %    Sig: Apply 1 Application topically 2 (two) times daily.    Dispense:  30 g    Refill:  0   methylPREDNISolone acetate (DEPO-MEDROL) injection 80 mg     Return in about 6 months (around 10/28/2022) for annual physical exam.    Farrel Conners, MD

## 2022-04-29 NOTE — Assessment & Plan Note (Signed)
Continue current dose of ambien for sleep. She has been on this medication long term and we continue to monitor her for signs of adverse effects. So far she is tolerating it well without any side effects. New rx sent to pharmacy

## 2022-04-30 ENCOUNTER — Telehealth: Payer: Self-pay | Admitting: Family Medicine

## 2022-04-30 NOTE — Telephone Encounter (Signed)
Pt's spouse changed his mind and did not want to send a message - requested info instead

## 2022-05-04 ENCOUNTER — Ambulatory Visit (INDEPENDENT_AMBULATORY_CARE_PROVIDER_SITE_OTHER)
Admission: RE | Admit: 2022-05-04 | Discharge: 2022-05-04 | Disposition: A | Discharge status: Home / Self Care | Payer: PPO | Source: Ambulatory Visit | Attending: Family Medicine | Admitting: Family Medicine

## 2022-05-04 ENCOUNTER — Other Ambulatory Visit: Payer: Self-pay | Admitting: Family Medicine

## 2022-05-04 ENCOUNTER — Encounter: Payer: Self-pay | Admitting: Family Medicine

## 2022-05-04 DIAGNOSIS — J209 Acute bronchitis, unspecified: Secondary | ICD-10-CM | POA: Diagnosis not present

## 2022-05-04 NOTE — Progress Notes (Signed)
CXR is negative for pnuemonia, shows some chronic emphysema but otherwise no acute findings

## 2022-05-06 ENCOUNTER — Telehealth: Payer: Self-pay | Admitting: Family Medicine

## 2022-05-06 DIAGNOSIS — J209 Acute bronchitis, unspecified: Secondary | ICD-10-CM

## 2022-05-06 NOTE — Telephone Encounter (Signed)
Pt is returning Brittany [name] call and would like xray results

## 2022-05-06 NOTE — Telephone Encounter (Signed)
Spoke with patient regarding the CXR findings. Pt reports she is still having coughing despite the steroids and abx. We discussed that since she is still finishing up her abx we will need to wait a few more weeks to see if her symptoms resolve before ordering additional testing such as LFT's. Pt is agreable to waiting a couple more weeks. Ok to close this task

## 2022-05-06 NOTE — Telephone Encounter (Signed)
Pt called to inform [facility] that [facility].  guaiFENesin-codeine 100-10 MG/5ML syrup  Pt is asking if MD could please send another Rx to the pharmacy listed below:  [place], [place] - [address] Phone: [phone]  Fax: [phone]

## 2022-05-07 MED ORDER — GUAIFENESIN-CODEINE 100-10 MG/5ML PO SOLN: 5.0000 mL | Freq: Four times a day (QID) | ORAL | 0 refills | Status: DC | PRN

## 2022-05-07 NOTE — Telephone Encounter (Signed)
Rx sent 

## 2022-05-07 NOTE — Telephone Encounter (Signed)
Patient informed of the message below.

## 2022-05-18 ENCOUNTER — Ambulatory Visit: Payer: PPO | Admitting: Cardiology

## 2022-05-19 ENCOUNTER — Encounter: Payer: Self-pay | Admitting: Family Medicine

## 2022-06-18 ENCOUNTER — Other Ambulatory Visit: Payer: Self-pay | Admitting: Family Medicine

## 2022-07-03 ENCOUNTER — Other Ambulatory Visit: Payer: Self-pay | Admitting: Family Medicine

## 2022-09-16 ENCOUNTER — Other Ambulatory Visit: Payer: Self-pay | Admitting: Family Medicine

## 2022-10-02 ENCOUNTER — Telehealth: Payer: Self-pay | Admitting: Cardiology

## 2022-10-02 MED ORDER — REPATHA SURECLICK 140 MG/ML ~~LOC~~ SOAJ: 1.0000 mL | SUBCUTANEOUS | 3 refills | Status: DC

## 2022-10-02 NOTE — Telephone Encounter (Signed)
*  STAT* If patient is at the pharmacy, call can be transferred to refill team.   1. Which medications need to be refilled? (please list name of each medication and dose if known)   Evolocumab (REPATHA SURECLICK) 140 MG/ML SOAJ    2. Would you like to learn more about the convenience, safety, & potential cost savings by using the [facility]?   3. Are you open to using the [facility] (Type [facility]. ).  4. Which pharmacy/location (including street and city if local pharmacy) is medication to be sent to?  [phone] [place], [place] [facility] [address]   5. Do they need a 30 day or 90 day supply?  90 day  Patient stated she has one more injection left.

## 2022-10-09 ENCOUNTER — Encounter: Payer: Self-pay | Admitting: Physician Assistant

## 2022-10-09 ENCOUNTER — Ambulatory Visit: Payer: PPO | Admitting: Physician Assistant

## 2022-10-09 ENCOUNTER — Other Ambulatory Visit: Payer: Self-pay

## 2022-10-09 DIAGNOSIS — Z96652 Presence of left artificial knee joint: Secondary | ICD-10-CM

## 2022-10-09 NOTE — Progress Notes (Signed)
Post-Op Visit Note   Patient: Brittany [name]           Date of Birth: [birth year]           MRN: [id number] Visit Date: 10/09/2022 PCP: Karie Georges, MD   Assessment & Plan:  Chief Complaint:  Chief Complaint  Patient presents with   Left Knee - Follow-up    Left total knee arthroplasty 10/11/2020   Visit Diagnoses:  1. Status post total left knee replacement     Plan: Patient is a pleasant 77 year old female who comes in today 2 years status post left total knee replacement 10/11/2020.  She has been doing well.  No complaints.  Examination left knee reveals reveals range of motion from 0 to 130 degrees.  She is stable to valgus varus stress.  She is neurovascular intact distally.  At this point, she will continue to advance with activity as tolerated.  Dental prophylaxis no longer reinforced.  Follow-up with [place] in the next 2 years or so for repeat evaluation and 2 view x-rays of left knee.  Call with concerns or questions in the meantime.  Follow-Up Instructions: Return in about 2 years (around 10/08/2024).   Orders:  Orders Placed This Encounter  Procedures   XR Knee 1-2 Views Left   No orders of the defined types were placed in this encounter.   Imaging: XR Knee 1-2 Views Left  Result Date: 10/09/2022 Well-seated prosthesis without complication   PMFS History: Patient Active Problem List   Diagnosis Date Noted   Chronic idiopathic urticaria 04/29/2022   OSA (obstructive sleep apnea) 01/09/2021   Status post total left knee replacement 10/11/2020   Primary osteoarthritis of left knee 10/02/2020   Atrial fibrillation (HCC) 05/13/2018   Bloating 04/11/2018   Abnormal EKG 08/23/2017   Malignant neoplasm of overlapping sites of right breast in female, estrogen receptor positive ([facility]) 08/17/2016   Hx of colonic polyps 08/11/2016   Iron deficiency anemia 07/28/2016   Blood donor 07/28/2016   Pain due to total right knee replacement ([facility]) 04/06/2016   S/P right TKA  12/31/2015   Baker's cyst, unruptured 09/27/2015   Insomnia 07/31/2015   Onychomycosis 09/25/2014   Urinary frequency 05/21/2014   Mitral valve prolapse 05/08/2014   Mitral regurgitation 05/08/2014   Dyslipidemia 02/09/2014   History of radiation therapy    Hiatal hernia    History of chemotherapy    Osteoporosis 03/21/2012   Seasonal and perennial allergic rhinitis 12/17/2011   B12 deficiency 10/09/2009   TOBACCO USE, QUIT 01/31/2009   THYROID NODULE 12/25/2008   GERD 11/23/2007   VARICOSE VEIN 12/29/2006   Anxiety state 12/23/2006   Depression 12/23/2006   Migraine variant 12/23/2006   Disorder of bone and cartilage 12/23/2006   Past Medical History:  Diagnosis Date   A-fib (HCC)    Acid reflux    Acute cystitis with positive culture 03/06/2014   Anemia    Anxiety    Arthritis    Baker cyst, left 04/06/2016   Aspirated 04/06/2016   Breast cancer (HCC) 11/23/2007   right, ER/PR -, HER 2 -   Cognitive deficits 09/25/2014   2016 Very mild, could be related to meds    Degenerative arthritis of left knee 04/06/2016   Injection and aspiration 04/06/2016   Degenerative arthritis of right knee 09/27/2015   Depression    Esophageal stricture    Gastritis 10/2007   History of chemotherapy    neoadjuvant chemo, last dose 03/2008  History of radiation therapy 07/25/08 -09/11/08   right breast   Hx of colonic polyps 08/11/2016   Hyperlipidemia    Migraines    rare   Myalgia 07/18/2013   5/15 multiple site    OSA (obstructive sleep apnea)    severe OSA AHI 34, intolerant to PAP and now s/p hypoglossal nerve stimulator implant   Osteopenia    Rupture of quadriceps tendon 03/23/2017    Family History  Problem Relation Age of Onset   Heart disease Father    Cancer Father        lung-nonsmoker   Breast cancer Sister 81   Colon cancer Sister 86   Epilepsy Mother        at older age   Dementia Mother        ?uncertain type   Hip fracture Mother        x2   Other  Brother        suicide   Other Maternal Grandfather        didn't go to doctor   Esophageal cancer Neg Hx    Stomach cancer Neg Hx     Past Surgical History:  Procedure Laterality Date   ATRIAL FIBRILLATION ABLATION N/A 10/07/2018   Procedure: ATRIAL FIBRILLATION ABLATION;  Surgeon: Regan Lemming, MD;  Location: [facility];  Service: Cardiovascular;  Laterality: N/A;   BREAST LUMPECTOMY  06/26/2008   right, CA   XRT, chemo   COLONOSCOPY     DRUG INDUCED ENDOSCOPY N/A 02/21/2020   Procedure: DRUG INDUCED ENDOSCOPY;  Surgeon: Christia Reading, MD;  Location: [facility];  Service: ENT;  Laterality: N/A;   EYE SURGERY Bilateral    growth removed per right thigh     as teen - mole   IMPLANTATION OF HYPOGLOSSAL NERVE STIMULATOR N/A 05/15/2020   Procedure: IMPLANTATION OF HYPOGLOSSAL NERVE STIMULATOR;  Surgeon: Christia Reading, MD;  Location: [facility];  Service: ENT;  Laterality: N/A;   JOINT REPLACEMENT  12/31/2015   RTK   QUADRICEPS TENDON REPAIR Right 09/14/2016   Procedure: OPEN REPAIR QUADRICEP TENDON;  Surgeon: Durene Romans, MD;  Location: [facility];  Service: Orthopedics;  Laterality: Right;   TOTAL KNEE ARTHROPLASTY Right 12/31/2015   Procedure: RIGHT TOTAL KNEE ARTHROPLASTY;  Surgeon: Durene Romans, MD;  Location: [facility];  Service: Orthopedics;  Laterality: Right;   TOTAL KNEE ARTHROPLASTY Left 10/11/2020   Procedure: TOTAL KNEE ARTHROPLASTY;  Surgeon: Tarry Kos, MD;  Location: [facility];  Service: Orthopedics;  Laterality: Left;   UPPER GASTROINTESTINAL ENDOSCOPY     Social History   Occupational History    Employer: RETIRED  Tobacco Use   Smoking status: Former    Current packs/day: 0.00    Average packs/day: 0.3 packs/day for 5.0 years (1.3 ttl pk-yrs)    Types: Cigarettes    Start date: 03/02/1962    Quit date: 03/03/1967    Years since quitting: 55.6   Smokeless tobacco: Never   Tobacco comments:    6 cig per day when she was smoking/quit years ago   Vaping Use   Vaping status: Never Used  Substance and Sexual Activity   Alcohol use: Yes    Alcohol/week: 7.0 standard drinks of alcohol    Types: 7 Cans of beer per week    Comment: 1 beer per day - 2.4 % alcholol - [employer] 55   Drug use: No   Sexual activity: Yes    Partners: Male  Birth control/protection: Post-menopausal

## 2022-10-11 ENCOUNTER — Other Ambulatory Visit: Payer: Self-pay | Admitting: Family Medicine

## 2022-10-11 DIAGNOSIS — F411 Generalized anxiety disorder: Secondary | ICD-10-CM

## 2022-10-11 DIAGNOSIS — K219 Gastro-esophageal reflux disease without esophagitis: Secondary | ICD-10-CM

## 2022-10-12 ENCOUNTER — Other Ambulatory Visit: Payer: Self-pay | Admitting: Family Medicine

## 2022-10-12 DIAGNOSIS — G4700 Insomnia, unspecified: Secondary | ICD-10-CM

## 2022-10-14 ENCOUNTER — Encounter: Payer: Self-pay | Admitting: Oncology

## 2022-10-14 ENCOUNTER — Telehealth: Payer: Self-pay

## 2022-10-14 ENCOUNTER — Telehealth: Payer: Self-pay | Admitting: Cardiology

## 2022-10-14 ENCOUNTER — Other Ambulatory Visit (HOSPITAL_COMMUNITY): Payer: Self-pay

## 2022-10-14 DIAGNOSIS — E785 Hyperlipidemia, unspecified: Secondary | ICD-10-CM

## 2022-10-14 NOTE — Telephone Encounter (Signed)
Pharmacy Patient Advocate Encounter   Received notification from Physician's Office that prior authorization for REPATHA is required/requested.   Insurance verification completed.   The patient is insured through [employer]/ [employer] .   Needing additional information for approval, RPH is aware.

## 2022-10-14 NOTE — Telephone Encounter (Signed)
Patient will come in tomorrow for lipid panel

## 2022-10-14 NOTE — Telephone Encounter (Signed)
Thank you for ordering that. I will keep the [facility] request open while we wait on labs to populate.

## 2022-10-14 NOTE — Telephone Encounter (Signed)
[facility] need a prior auth her her repatha.  Patient states in the past she has gotten it for free.

## 2022-10-14 NOTE — Telephone Encounter (Signed)
Please advise 

## 2022-10-14 NOTE — Telephone Encounter (Signed)
Will route this message to our PharmD team to further assist the pt in getting a prior auth for her repatha.   Pt is managed in their clinic.

## 2022-10-15 ENCOUNTER — Encounter (INDEPENDENT_AMBULATORY_CARE_PROVIDER_SITE_OTHER): Payer: Self-pay

## 2022-10-15 ENCOUNTER — Ambulatory Visit: Payer: PPO | Attending: Family Medicine

## 2022-10-15 DIAGNOSIS — E785 Hyperlipidemia, unspecified: Secondary | ICD-10-CM

## 2022-10-15 LAB — LIPID PANEL
Chol/HDL Ratio: 2.7 ratio (ref 0.0–4.4)
Cholesterol, Total: 166 mg/dL (ref 100–199)
HDL: 62 mg/dL (ref >39)
LDL Chol Calc (NIH): 90 mg/dL (ref 0–99)
Triglycerides: 74 mg/dL (ref 0–149)
VLDL Cholesterol Cal: 14 mg/dL (ref 5–40)

## 2022-10-16 ENCOUNTER — Other Ambulatory Visit (HOSPITAL_COMMUNITY): Payer: Self-pay

## 2022-10-16 NOTE — Telephone Encounter (Signed)
Pharmacy Patient Advocate Encounter  Received notification from [employer] that Prior Authorization for Repatha SureClick 140MG /ML auto-injectors has been APPROVED from 10/16/22 to 04/14/23. Ran test claim, Copay is $94.00. This test claim was processed through [facility]- copay amounts may vary at other pharmacies due to pharmacy/plan contracts, or as the patient moves through the different stages of their insurance plan.   PA #/Case ID/Reference #: [id number]

## 2022-10-16 NOTE — Telephone Encounter (Signed)
Pharmacy Patient Advocate Encounter  Received notification from [employer] that Prior Authorization for REPATHA has been APPROVED from 10/16/22 to 04/14/23. Ran test claim, Copay is $94 FOR 3 MONTHS. This test claim was processed through [facility]- copay amounts may vary at other pharmacies due to pharmacy/plan contracts, or as the patient moves through the different stages of their insurance plan.

## 2022-10-22 ENCOUNTER — Encounter: Payer: Self-pay | Admitting: Pharmacist

## 2022-10-28 ENCOUNTER — Ambulatory Visit: Payer: PPO | Admitting: Cardiology

## 2022-10-29 ENCOUNTER — Encounter: Payer: PPO | Admitting: Family Medicine

## 2022-11-03 ENCOUNTER — Ambulatory Visit (INDEPENDENT_AMBULATORY_CARE_PROVIDER_SITE_OTHER): Payer: PPO | Admitting: Family Medicine

## 2022-11-03 ENCOUNTER — Encounter: Payer: Self-pay | Admitting: Family Medicine

## 2022-11-03 VITALS — BP 110/80 | HR 95 | Temp 97.7°F | Ht 67.0 in | Wt 128.6 lb

## 2022-11-03 DIAGNOSIS — Z0000 Encounter for general adult medical examination without abnormal findings: Secondary | ICD-10-CM | POA: Diagnosis not present

## 2022-11-03 DIAGNOSIS — F419 Anxiety disorder, unspecified: Secondary | ICD-10-CM

## 2022-11-03 DIAGNOSIS — Z23 Encounter for immunization: Secondary | ICD-10-CM | POA: Diagnosis not present

## 2022-11-03 DIAGNOSIS — Z0001 Encounter for general adult medical examination with abnormal findings: Secondary | ICD-10-CM

## 2022-11-03 LAB — CBC WITH DIFFERENTIAL/PLATELET
Basophils Absolute: 0 10*3/uL (ref 0.0–0.1)
Basophils Relative: 0.9 % (ref 0.0–3.0)
Eosinophils Absolute: 0.1 10*3/uL (ref 0.0–0.7)
Eosinophils Relative: 2.2 % (ref 0.0–5.0)
HCT: 40.6 % (ref 36.0–46.0)
Hemoglobin: 13.3 g/dL (ref 12.0–15.0)
Lymphocytes Relative: 24.5 % (ref 12.0–46.0)
Lymphs Abs: 1.4 10*3/uL (ref 0.7–4.0)
MCHC: 32.8 g/dL (ref 30.0–36.0)
MCV: 95.7 fl (ref 78.0–100.0)
Monocytes Absolute: 0.5 10*3/uL (ref 0.1–1.0)
Monocytes Relative: 8.5 % (ref 3.0–12.0)
Neutro Abs: 3.6 10*3/uL (ref 1.4–7.7)
Neutrophils Relative %: 63.9 % (ref 43.0–77.0)
Platelets: 304 10*3/uL (ref 150.0–400.0)
RBC: 4.24 Mil/uL (ref 3.87–5.11)
RDW: 12.8 % (ref 11.5–15.5)
WBC: 5.7 10*3/uL (ref 4.0–10.5)

## 2022-11-03 LAB — COMPREHENSIVE METABOLIC PANEL
ALT: 10 U/L (ref 0–35)
AST: 18 U/L (ref 0–37)
Albumin: 4.2 g/dL (ref 3.5–5.2)
Alkaline Phosphatase: 59 U/L (ref 39–117)
BUN: 12 mg/dL (ref 6–23)
CO2: 29 meq/L (ref 19–32)
Calcium: 10.1 mg/dL (ref 8.4–10.5)
Chloride: 100 meq/L (ref 96–112)
Creatinine, Ser: 0.63 mg/dL (ref 0.40–1.20)
GFR: 86.93 mL/min (ref >60.00)
Glucose, Bld: 81 mg/dL (ref 70–99)
Potassium: 4.3 meq/L (ref 3.5–5.1)
Sodium: 136 meq/L (ref 135–145)
Total Bilirubin: 0.5 mg/dL (ref 0.2–1.2)
Total Protein: 7.4 g/dL (ref 6.0–8.3)

## 2022-11-03 NOTE — Patient Instructions (Addendum)
Magnesium oxide -- 400 mg once or twice a days needed  Health Maintenance, Female Adopting a healthy lifestyle and getting preventive care are important in promoting health and wellness. Ask your health care provider about: The right schedule for you to have regular tests and exams. Things you can do on your own to prevent diseases and keep yourself healthy. What should I know about diet, weight, and exercise? Eat a healthy diet  Eat a diet that includes plenty of vegetables, fruits, low-fat dairy products, and lean protein. Do not eat a lot of foods that are high in solid fats, added sugars, or sodium. Maintain a healthy weight Body mass index (BMI) is used to identify weight problems. It estimates body fat based on height and weight. Your health care provider can help determine your BMI and help you achieve or maintain a healthy weight. Get regular exercise Get regular exercise. This is one of the most important things you can do for your health. Most adults should: Exercise for at least 150 minutes each week. The exercise should increase your heart rate and make you sweat (moderate-intensity exercise). Do strengthening exercises at least twice a week. This is in addition to the moderate-intensity exercise. Spend less time sitting. Even light physical activity can be beneficial. Watch cholesterol and blood lipids Have your blood tested for lipids and cholesterol at 75 years of age, then have this test every 5 years. Have your cholesterol levels checked more often if: Your lipid or cholesterol levels are high. You are older than 75 years of age. You are at high risk for heart disease. What should I know about cancer screening? Depending on your health history and family history, you may need to have cancer screening at various ages. This may include screening for: Breast cancer. Cervical cancer. Colorectal cancer. Skin cancer. Lung cancer. What should I know about heart disease,  diabetes, and high blood pressure? Blood pressure and heart disease High blood pressure causes heart disease and increases the risk of stroke. This is more likely to develop in people who have high blood pressure readings or are overweight. Have your blood pressure checked: Every 3-5 years if you are 100-64 years of age. Every year if you are 32 years old or older. Diabetes Have regular diabetes screenings. This checks your fasting blood sugar level. Have the screening done: Once every three years after age 53 if you are at a normal weight and have a low risk for diabetes. More often and at a younger age if you are overweight or have a high risk for diabetes. What should I know about preventing infection? Hepatitis B If you have a higher risk for hepatitis B, you should be screened for this virus. Talk with your health care provider to find out if you are at risk for hepatitis B infection. Hepatitis C Testing is recommended for: Everyone born from [birth year] through 1963. Anyone with known risk factors for hepatitis C. Sexually transmitted infections (STIs) Get screened for STIs, including gonorrhea and chlamydia, if: You are sexually active and are younger than 75 years of age. You are older than 75 years of age and your health care provider tells you that you are at risk for this type of infection. Your sexual activity has changed since you were last screened, and you are at increased risk for chlamydia or gonorrhea. Ask your health care provider if you are at risk. Ask your health care provider about whether you are at high risk for HIV. Your  health care provider may recommend a prescription medicine to help prevent HIV infection. If you choose to take medicine to prevent HIV, you should first get tested for HIV. You should then be tested every 3 months for as long as you are taking the medicine. Pregnancy If you are about to stop having your period (premenopausal) and you may become pregnant,  seek counseling before you get pregnant. Take 400 to 800 micrograms (mcg) of folic acid every day if you become pregnant. Ask for birth control (contraception) if you want to prevent pregnancy. Osteoporosis and menopause Osteoporosis is a disease in which the bones lose minerals and strength with aging. This can result in bone fractures. If you are 67 years old or older, or if you are at risk for osteoporosis and fractures, ask your health care provider if you should: Be screened for bone loss. Take a calcium or vitamin D supplement to lower your risk of fractures. Be given hormone replacement therapy (HRT) to treat symptoms of menopause. Follow these instructions at home: Alcohol use Do not drink alcohol if: Your health care provider tells you not to drink. You are pregnant, may be pregnant, or are planning to become pregnant. If you drink alcohol: Limit how much you have to: 0-1 drink a day. Know how much alcohol is in your drink. In the [place] one drink equals one 12 oz bottle of beer (355 mL), one 5 oz glass of wine (148 mL), or one 1 oz glass of hard liquor (44 mL). Lifestyle Do not use any products that contain nicotine or tobacco. These products include cigarettes, chewing tobacco, and vaping devices, such as e-cigarettes. If you need help quitting, ask your health care provider. Do not use street drugs. Do not share needles. Ask your health care provider for help if you need support or information about quitting drugs. General instructions Schedule regular health, dental, and eye exams. Stay current with your vaccines. Tell your health care provider if: You often feel depressed. You have ever been abused or do not feel safe at home. Summary Adopting a healthy lifestyle and getting preventive care are important in promoting health and wellness. Follow your health care provider's instructions about healthy diet, exercising, and getting tested or screened for diseases. Follow your  health care provider's instructions on monitoring your cholesterol and blood pressure. This information is not intended to replace advice given to you by your health care provider. Make sure you discuss any questions you have with your health care provider. Document Revised: 07/08/2020 Document Reviewed: 07/08/2020 Elsevier Patient Education  2024 [employer].

## 2022-11-03 NOTE — Progress Notes (Unsigned)
Complete physical exam  Patient: Brittany [name]   DOB: April 25, 1945   77 y.o. Female  MRN: [id number]  Subjective:    Chief Complaint  Patient presents with  . Annual Exam    Brittany [name] is a 77 y.o. female who presents today for a complete physical exam. She reports consuming a general diet.  Patient tried to engage in exercise, has chronic foot pain which inhibits her ability to exercise.  She generally feels increasing anxiety. She reports sleeping well. She does not have additional problems to discuss today.   Pt is reporting  Most recent fall risk assessment:    11/03/2022   10:05 AM  Fall Risk   Falls in the past year? 0  Number falls in past yr: 0  Injury with Fall? 0  Risk for fall due to : No Fall Risks  Follow up Falls evaluation completed      11/03/2022   10:45 AM 04/01/2022    5:14 PM  GAD 7 : Generalized Anxiety Score  Nervous, Anxious, on Edge 3 0  Control/stop worrying 3 0  Worry too much - different things 1 0  Trouble relaxing 2 0  Restless 1 0  Easily annoyed or irritable 0 0  Afraid - awful might happen 0 0  Total GAD 7 Score 10 0  Anxiety Difficulty  Not difficult at all     Most recent depression screenings:    11/03/2022   10:06 AM 04/01/2022    5:14 PM  PHQ 2/9 Scores  PHQ - 2 Score 0 0  PHQ- 9 Score 0 0    Vision:{Last Opthalmologist Visit:27382} and Dental: {CHL AMB ROS DENTAL (Optional):27383::"No current dental problems"}  Patient Active Problem List   Diagnosis Date Noted  . Chronic idiopathic urticaria 04/29/2022  . OSA (obstructive sleep apnea) 01/09/2021  . Status post total left knee replacement 10/11/2020  . Primary osteoarthritis of left knee 10/02/2020  . Atrial fibrillation (HCC) 05/13/2018  . Bloating 04/11/2018  . Abnormal EKG 08/23/2017  . Malignant neoplasm of overlapping sites of right breast in female, estrogen receptor positive (HCC) 08/17/2016  . Hx of colonic polyps 08/11/2016  . Iron deficiency anemia 07/28/2016  .  Blood donor 07/28/2016  . Pain due to total right knee replacement (HCC) 04/06/2016  . 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 Baker's cyst, unruptured 09/27/2015  . Insomnia 07/31/2015  . Onychomycosis 09/25/2014  . Urinary frequency 05/21/2014  . Mitral valve prolapse 05/08/2014  . Mitral regurgitation 05/08/2014  . Dyslipidemia 02/09/2014  . History of radiation therapy   . Hiatal hernia   . History of chemotherapy   . Osteoporosis 03/21/2012  . Seasonal and perennial allergic rhinitis 12/17/2011  . B12 deficiency 10/09/2009  . TOBACCO USE, QUIT 01/31/2009  . THYROID NODULE 12/25/2008  . GERD 11/23/2007  . VARICOSE VEIN 12/29/2006  . Anxiety state 12/23/2006  . Depression 12/23/2006  . Migraine variant 12/23/2006  . Disorder of bone and cartilage 12/23/2006      Patient Care Team: Karie Georges, MD as PCP - General (Family Medicine) Jake Bathe, MD as PCP - Cardiology (Cardiology) Regan Lemming, MD as PCP - Electrophysiology (Cardiology) Quintella Reichert, MD as PCP - Sleep Medicine (Cardiology) [name], Valentino Hue, MD (Inactive) as Consulting Physician (Hematology and Oncology) Quintella Reichert, MD as Consulting Physician (Sleep Medicine) Verner Chol, [facility] (Inactive) as Pharmacist (Pharmacist) Tarry Kos, MD as Attending Physician (Orthopedic Surgery)   Outpatient Medications Prior  to Visit  Medication Sig  . acetaminophen (TYLENOL) 500 MG tablet Take 500 mg by mouth every 6 (six) hours as needed.  . Biotin 5000 MCG TABS Take 5,000 mcg by mouth daily.  . Calcium-Magnesium-Zinc (CAL-MAG-ZINC PO) Take 1 tablet by mouth daily. 600 mg  . Cholecalciferol (VITAMIN D) 50 MCG (2000 UT) tablet Take 2,000 Units by mouth daily.  . citalopram (CELEXA) 40 MG tablet TAKE 1 TABLET BY MOUTH EVERY DAY  . Cyanocobalamin (VITAMIN B-12 PO) Take 2,500 mcg by mouth daily.  [name] denosumab (PROLIA) 60 MG/ML SOSY injection Inject 60 mg into the skin every 6 (six) months.  . diclofenac  Sodium (VOLTAREN) 1 % GEL Apply 2 g topically 4 (four) times daily.  . diphenhydrAMINE (BENADRYL) 25 MG tablet Take 25 mg by mouth every 6 (six) hours as needed for allergies.  . Evolocumab (REPATHA SURECLICK) 140 MG/ML SOAJ Inject 140 mg into the skin every 14 (fourteen) days.  . famotidine (PEPCID) 40 MG tablet TAKE 1 TABLET BY MOUTH EVERY DAY  . fluticasone (FLONASE) 50 MCG/ACT nasal spray SPRAY 2 SPRAYS INTO EACH NOSTRIL EVERY DAY  . guaiFENesin-codeine 100-10 MG/5ML syrup Take 5 mLs by mouth every 6 (six) hours as needed for cough.  . metoprolol tartrate (LOPRESSOR) 50 MG tablet Take 50 mg by mouth as needed (racing heart).  . Omega-3 Fatty Acids (FISH OIL) 1200 MG CAPS Take 1,200 mg by mouth daily.  . Probiotic Product (PROBIOTIC DAILY PO) Take 1 tablet by mouth daily.  . SUMAtriptan (IMITREX) 100 MG tablet Take 1 tablet (100 mg total) by mouth once for 1 dose. Can repeat dose in 2 hours if symptoms persist. Max dose 2 tablets in 24 hours.  . terbinafine (LAMISIL) 250 MG tablet TAKE 1 TABLET BY MOUTH EVERY DAY  . triamcinolone cream (KENALOG) 0.1 % Apply 1 Application topically 2 (two) times daily.  [name] zolpidem (AMBIEN) 5 MG tablet TAKE 0.5-1 TABLETS (2.5-5 MG TOTAL) BY MOUTH AT BEDTIME AS NEEDED FOR SLEEP.  . [DISCONTINUED] amoxicillin-clavulanate (AUGMENTIN) 875-125 MG tablet Take 1 tablet by mouth 2 (two) times daily.   No facility-administered medications prior to visit.    ROS     Objective:     BP 110/80 (BP Location: Left Arm, Patient Position: Sitting, Cuff Size: Normal)   Pulse 95   Temp 97.7 F (36.5 C) (Oral)   Ht 5\' 7"  (1.702 m)   Wt 128 lb 9.6 oz (58.3 kg)   LMP 03/02/1994 (Approximate)   SpO2 98%   BMI 20.14 kg/m  {Vitals History (Optional):23777}  Physical Exam Vitals reviewed.  Constitutional:      Appearance: Normal appearance. She is well-groomed and normal weight.  HENT:     Right Ear: Tympanic membrane and ear canal normal.     Left Ear: Tympanic  membrane and ear canal normal.     Mouth/Throat:     Mouth: Mucous membranes are moist.     Pharynx: No posterior oropharyngeal erythema.  Eyes:     Conjunctiva/sclera: Conjunctivae normal.  Neck:     Thyroid: No thyromegaly.  Cardiovascular:     Rate and Rhythm: Normal rate and regular rhythm.     Pulses: Normal pulses.     Heart sounds: S1 normal and S2 normal.  Pulmonary:     Effort: Pulmonary effort is normal.     Breath sounds: Normal breath sounds and air entry.  Abdominal:     General: Abdomen is flat. Bowel sounds are normal.  Palpations: Abdomen is soft.  Musculoskeletal:     Right lower leg: No edema.     Left lower leg: No edema.  Lymphadenopathy:     Cervical: No cervical adenopathy.  Neurological:     Mental Status: She is alert and oriented to person, place, and time. Mental status is at baseline.     Gait: Gait is intact.  Psychiatric:        Mood and Affect: Mood and affect normal.        Speech: Speech normal.        Behavior: Behavior normal.        Judgment: Judgment normal.     No results found for any visits on 11/03/22. Last lipids Lab Results  Component Value Date   CHOL 166 10/15/2022   HDL 62 10/15/2022   LDLCALC 90 10/15/2022   LDLDIRECT 178.9 03/23/2013   TRIG 74 10/15/2022   CHOLHDL 2.7 10/15/2022        Assessment & Plan:    Routine Health Maintenance and Physical Exam  Immunization History  Administered Date(s) Administered  . Fluad Quad(high Dose 65+) 11/06/2020  . Fluad Trivalent(High Dose 65+) 11/03/2022  . Influenza Split 12/01/2011  . Influenza Whole 12/06/2007, 11/15/2008, 01/06/2010, 11/11/2010  . Influenza, High Dose Seasonal PF 12/20/2012, 11/20/2014, 11/21/2017, 11/07/2019  . Influenza,inj,Quad PF,6+ Mos 11/30/2013  . Influenza,inj,quad, With Preservative 11/02/2016  . Influenza-Unspecified 11/27/2015, 10/31/2016, 10/27/2018  . Moderna Covid-19 Vaccine Bivalent Booster 68yrs & up 02/15/2021, 12/12/2021  . Moderna  Sars-Covid-2 Vaccination 03/30/2019, 04/27/2019, 01/08/2020, 09/30/2020  . PNEUMOCOCCAL CONJUGATE-20 02/10/2021  . Pneumococcal Conjugate-13 03/22/2013  . Pneumococcal Polysaccharide-23 01/31/2009, 07/31/2015, 06/20/2019  . Rabies, IM 03/04/2015, 03/07/2015, 03/11/2015, 03/18/2015  . Td 10/09/2009  . Tdap 03/04/2015  . Zoster Recombinant(Shingrix) 02/13/2017, 07/03/2017  . Zoster, Live 04/18/2009    Health Maintenance  Topic Date Due  . COVID-19 Vaccine (7 - 2023-24 season) 11/19/2022 (Originally 11/01/2022)  . Medicare Annual Wellness (AWV)  03/10/2023  . MAMMOGRAM  03/11/2023  . Colonoscopy  02/06/2025  . DTaP/Tdap/Td (3 - Td or Tdap) 03/03/2025  . Pneumonia Vaccine 47+ Years old  Completed  . INFLUENZA VACCINE  Completed  . DEXA SCAN  Completed  . Hepatitis C Screening  Completed  . Zoster Vaccines- Shingrix  Completed  . HPV VACCINES  Aged Out    Discussed health benefits of physical activity, and encouraged her to engage in regular exercise appropriate for her age and condition.  Need for immunization against influenza -     Flu Vaccine Trivalent High Dose (Fluad)  Anxiety -     Ambulatory referral to Psychology -     TSH  Encounter for routine adult health examination with abnormal findings -     Comprehensive metabolic panel -     CBC with Differential/Platelet    No follow-ups on file.     Karie Georges, MD

## 2022-11-04 LAB — TSH: TSH: 1.5 u[IU]/mL (ref 0.35–5.50)

## 2022-11-11 ENCOUNTER — Encounter: Payer: Self-pay | Admitting: Cardiology

## 2022-11-11 ENCOUNTER — Ambulatory Visit: Payer: PPO | Attending: Cardiology | Admitting: Cardiology

## 2022-11-11 ENCOUNTER — Encounter: Payer: Self-pay | Admitting: Oncology

## 2022-11-11 VITALS — BP 100/68 | HR 96 | Ht 67.0 in | Wt 129.0 lb

## 2022-11-11 DIAGNOSIS — G4733 Obstructive sleep apnea (adult) (pediatric): Secondary | ICD-10-CM | POA: Diagnosis not present

## 2022-11-11 NOTE — Progress Notes (Signed)
Date:  11/11/2022   ID:  Brittany [name], DOB 18-Jul-1945, MRN [id number] The patient was identified using 2 identifiers.  PCP:  Karie Georges, MD   [facility] Providers Cardiologist:  Donato Schultz, MD Electrophysiologist:  Will Jorja Loa, MD  Sleep Medicine:  Armanda Magic, MD     Evaluation Performed:  Follow-Up Visit  Chief Complaint:  OSA  History of Present Illness:    Brittany [name] is a 77 y.o. female with who is followed by Dr. Anne Fu for atrial fibrillation who was referred for sleep study.  She underwent home sleep study which showed severe obstructive sleep apnea with an AHI of 34.4/h and moderate oxygen desaturations to 81%.  She underwent CPAP titration to 12 cm H2O. she was intolerant to CPAP.  She was referred her to ENT for evaluation for the hypoglossal nerve stimulator.  She underwent Inspire device implantation 05/15/2020. She underwent in lab titration 01/09/2021 with a therapeutic amplitude of 1.4V with an AHI of 7.5/hr and O2 sats as low as 88%.  Her device was programmed to amplitude of 1.3V.  Repeat sleep study 01/16/2022 on 1.5V (level 3) showed an AHI of 1.3/hr.    She is here for follow-up today and is doing well.  She is on level 3 (1.5 V).  She sleeps well at night and feels rested in the morning.  She has no daytime sleepiness.  She does not think she is snoring any.  She has no tongue discomfort or sore throat.  She is using more than 7 hours nightly.  She inadvertently increased her level to 5 without knowledge and was unaware of the change.  Past Medical History:  Diagnosis Date   A-fib (HCC)    Acid reflux    Acute cystitis with positive culture 03/06/2014   Anemia    Anxiety    Arthritis    Baker cyst, left 04/06/2016   Aspirated 04/06/2016   Breast cancer (HCC) 11/23/2007   right, ER/PR -, HER 2 -   Cognitive deficits 09/25/2014   2016 Very mild, could be related to meds    Degenerative arthritis of left knee 04/06/2016   Injection  and aspiration 04/06/2016   Degenerative arthritis of right knee 09/27/2015   Depression    Esophageal stricture    Gastritis 10/2007   History of chemotherapy    neoadjuvant chemo, last dose 03/2008   History of radiation therapy 07/25/08 -09/11/08   right breast   Hx of colonic polyps 08/11/2016   Hyperlipidemia    Migraines    rare   Myalgia 07/18/2013   5/15 multiple site    OSA (obstructive sleep apnea)    severe OSA AHI 34, intolerant to PAP and now s/p hypoglossal nerve stimulator implant   Osteopenia    Rupture of quadriceps tendon 03/23/2017   Past Surgical History:  Procedure Laterality Date   ATRIAL FIBRILLATION ABLATION N/A 10/07/2018   Procedure: ATRIAL FIBRILLATION ABLATION;  Surgeon: Regan Lemming, MD;  Location: [facility];  Service: Cardiovascular;  Laterality: N/A;   BREAST LUMPECTOMY  06/26/2008   right, CA   XRT, chemo   COLONOSCOPY     DRUG INDUCED ENDOSCOPY N/A 02/21/2020   Procedure: DRUG INDUCED ENDOSCOPY;  Surgeon: Christia Reading, MD;  Location: [facility];  Service: ENT;  Laterality: N/A;   EYE SURGERY Bilateral    growth removed per right thigh     as teen - mole   IMPLANTATION OF  HYPOGLOSSAL NERVE STIMULATOR N/A 05/15/2020   Procedure: IMPLANTATION OF HYPOGLOSSAL NERVE STIMULATOR;  Surgeon: Christia Reading, MD;  Location: [facility];  Service: ENT;  Laterality: N/A;   JOINT REPLACEMENT  12/31/2015   RTK   QUADRICEPS TENDON REPAIR Right 09/14/2016   Procedure: OPEN REPAIR QUADRICEP TENDON;  Surgeon: Durene Romans, MD;  Location: [facility];  Service: Orthopedics;  Laterality: Right;   TOTAL KNEE ARTHROPLASTY Right 12/31/2015   Procedure: RIGHT TOTAL KNEE ARTHROPLASTY;  Surgeon: Durene Romans, MD;  Location: [facility];  Service: Orthopedics;  Laterality: Right;   TOTAL KNEE ARTHROPLASTY Left 10/11/2020   Procedure: TOTAL KNEE ARTHROPLASTY;  Surgeon: Tarry Kos, MD;  Location: [facility] OR;  Service: Orthopedics;  Laterality: Left;   UPPER  GASTROINTESTINAL ENDOSCOPY       Current Meds  Medication Sig   acetaminophen (TYLENOL) 500 MG tablet Take 500 mg by mouth every 6 (six) hours as needed.   Biotin 5000 MCG TABS Take 5,000 mcg by mouth daily.   Calcium-Magnesium-Zinc (CAL-MAG-ZINC PO) Take 1 tablet by mouth daily. 600 mg   Cholecalciferol (VITAMIN D) 50 MCG (2000 UT) tablet Take 2,000 Units by mouth daily.   citalopram (CELEXA) 40 MG tablet TAKE 1 TABLET BY MOUTH EVERY DAY   Cyanocobalamin (VITAMIN B-12 PO) Take 2,500 mcg by mouth daily.   denosumab (PROLIA) 60 MG/ML SOSY injection Inject 60 mg into the skin every 6 (six) months.   diclofenac Sodium (VOLTAREN) 1 % GEL Apply 2 g topically 4 (four) times daily.   diphenhydrAMINE (BENADRYL) 25 MG tablet Take 25 mg by mouth every 6 (six) hours as needed for allergies.   Evolocumab (REPATHA SURECLICK) 140 MG/ML SOAJ Inject 140 mg into the skin every 14 (fourteen) days.   famotidine (PEPCID) 40 MG tablet TAKE 1 TABLET BY MOUTH EVERY DAY   fluticasone (FLONASE) 50 MCG/ACT nasal spray SPRAY 2 SPRAYS INTO EACH NOSTRIL EVERY DAY   Omega-3 Fatty Acids (FISH OIL) 1200 MG CAPS Take 1,200 mg by mouth daily.   Probiotic Product (PROBIOTIC DAILY PO) Take 1 tablet by mouth daily.   SUMAtriptan (IMITREX) 100 MG tablet Take 1 tablet (100 mg total) by mouth once for 1 dose. Can repeat dose in 2 hours if symptoms persist. Max dose 2 tablets in 24 hours.   zolpidem (AMBIEN) 5 MG tablet TAKE 0.5-1 TABLETS (2.5-5 MG TOTAL) BY MOUTH AT BEDTIME AS NEEDED FOR SLEEP.     Allergies:   Boniva [ibandronic acid], Hydrocodone-acetaminophen, Lipitor [atorvastatin], and Tramadol   Social History   Tobacco Use   Smoking status: Former    Current packs/day: 0.00    Average packs/day: 0.3 packs/day for 5.0 years (1.3 ttl pk-yrs)    Types: Cigarettes    Start date: 03/02/1962    Quit date: 03/03/1967    Years since quitting: 55.7   Smokeless tobacco: Never   Tobacco comments:    6 cig per day when she was  smoking/quit years ago  Vaping Use   Vaping status: Never Used  Substance Use Topics   Alcohol use: Yes    Alcohol/week: 7.0 standard drinks of alcohol    Types: 7 Cans of beer per week    Comment: 1 beer per day - 2.4 % alcholol - [employer] 55   Drug use: No     Family Hx: The patient's family history includes Breast cancer (age of onset: 23) in her sister; Cancer in her father; Colon cancer (age of onset: 31) in her sister; Dementia in  her mother; Epilepsy in her mother; Heart disease in her father; Hip fracture in her mother; Other in her brother and maternal grandfather. There is no history of Esophageal cancer or Stomach cancer.  ROS:   Please see the history of present illness.    none All other systems reviewed and are negative.   Prior CV studies:   The following studies were reviewed today:  none  Labs/Other Tests and Data Reviewed:    EKG:  No ECG reviewed.  Recent Labs: 11/03/2022: ALT 10; BUN 12; Creatinine, Ser 0.63; Hemoglobin 13.3; Platelets 304.0; Potassium 4.3; Sodium 136; TSH 1.50   Recent Lipid Panel Lab Results  Component Value Date/Time   CHOL 166 10/15/2022 09:53 AM   TRIG 74 10/15/2022 09:53 AM   HDL 62 10/15/2022 09:53 AM   CHOLHDL 2.7 10/15/2022 09:53 AM   CHOLHDL 4 09/25/2020 10:04 AM   LDLCALC 90 10/15/2022 09:53 AM   LDLDIRECT 178.9 03/23/2013 10:59 AM    Wt Readings from Last 3 Encounters:  11/11/22 129 lb (58.5 kg)  11/03/22 128 lb 9.6 oz (58.3 kg)  04/29/22 128 lb (58.1 kg)     Objective:    Vital Signs:  BP 100/68   Pulse 96   Ht 5\' 7"  (1.702 m)   Wt 129 lb (58.5 kg)   LMP 03/02/1994 (Approximate)   SpO2 97%   BMI 20.20 kg/m   GEN: Well nourished, well developed in no acute distress HEENT: Normal NECK: No JVD; No carotid bruits LYMPHATICS: No lymphadenopathy CARDIAC:RRR, no murmurs, rubs, gallops RESPIRATORY:  Clear to auscultation without rales, wheezing or rhonchi  ABDOMEN: Soft, non-tender,  non-distended MUSCULOSKELETAL:  No edema; No deformity  SKIN: Warm and dry NEUROLOGIC:  Alert and oriented x 3 PSYCHIATRIC:  Normal affect  ASSESSMENT & PLAN:    1.  OSA  -she has severe OSA intolerant to PAP therapy -she is now s/p Hypoglossal nerve stimulator implant by Dr. Jenne Pane -her device was activated several months ago with the following programming:              -Sensation threshold 0.5V             -Functional threshold 0.6V -programmed with the following parameters:             -Pulse width 90ms             -rate 33Hz              -Start delay             -Pause time             -Therapy duration 8hr             -Electrodes +-+             -Stimulation settings 0.6-1.6V -she was doing well after 4 weeks of using her device -she had significant improvement in her daytime sleepiness and is not snoring -In lab inspire titration 01/09/2021 showed a therapeutic amplitude of 1.4V with an AHI of 7.5/hr and O2 sats as low as 88%.  Her device was programmed to amplitude of 1.3V  -Office visit 05/06/2021 she was up to 1.5 V and doing very well without any problems at all and no excessive daytime sleepiness.  Therapeutic range changed to amplitude of 1.3 to 1.8 V. -Office visit 11/06/2021 doing well on a level 3 (1.5 V) -Home sleep study 01/16/2022 showed an AHI 1.6/h at 1.3 V but jumped to 8/h  at 1.5 V.  She felt good though at 1.5 V. -Office visit 04/27/2022 doing well although Complaning of some snoring on her settings but no changes made >> she was encouraged to avoid sleeping supine to help with snoring -Office visit today 11/11/2022 she is doing well. Device interrogation was performed.  Currently set on level 3 (1.5V)>>she had inadvertently increased it to level 5 without knowing.  Since her last Sleep study was good at level 3, we dropped her level back to level 3 (1.5V) with a range of 1.3 to 1.7V.  Total time spent with patient today 30 minutes. This includes reviewing  records, evaluating the patient and coordinating care. Face-to-face time >50%.    Medication Adjustments/Labs and Tests Ordered: Current medicines are reviewed at length with the patient today.  Concerns regarding medicines are outlined above.  No orders of the defined types were placed in this encounter.  No orders of the defined types were placed in this encounter.  Followup with me in 6 months  Medication Adjustments/Labs and Tests Ordered: Current medicines are reviewed at length with the patient today.  Concerns regarding medicines are outlined above.   Tests Ordered: No orders of the defined types were placed in this encounter.   Medication Changes: No orders of the defined types were placed in this encounter.   Follow Up:  After Inspire titration  Signed, Armanda Magic, MD  11/11/2022 11:07 AM    [facility]

## 2022-11-11 NOTE — Patient Instructions (Signed)
Medication Instructions:  Your physician recommends that you continue on your current medications as directed. Please refer to the Current Medication list given to you today.  *If you need a refill on your cardiac medications before your next appointment, please call your pharmacy*   Lab Work: None.  If you have labs (blood work) drawn today and your tests are completely normal, you will receive your results only by: MyChart Message (if you have MyChart) OR A paper copy in the mail If you have any lab test that is abnormal or we need to change your treatment, we will call you to review the results.   Testing/Procedures: None.   Follow-Up:     Your next appointment will be at 9:40 AM on Monday, 05/17/22 with Dr. Mayford Knife and Brittany [name].

## 2022-11-23 ENCOUNTER — Ambulatory Visit (INDEPENDENT_AMBULATORY_CARE_PROVIDER_SITE_OTHER): Payer: PPO | Admitting: Psychology

## 2022-11-23 DIAGNOSIS — F411 Generalized anxiety disorder: Secondary | ICD-10-CM

## 2022-11-23 NOTE — Progress Notes (Signed)
Brittany [name], [facility]

## 2022-11-23 NOTE — Progress Notes (Signed)
[facility] Counselor Initial Adult Exam  Name: Brittany [name] Date: 11/23/2022 MRN: [id number] DOB: [birth year] PCP: Karie Georges, MD  Time spent: 11:55am-12:55pm  Pt is seen for a virtual video visit via caregility.  Pt joins from her home, reporting privacy, and counselor from her home office.  Pt consents to virtual visit and is aware of limitations of such visits.    Guardian/Payee:  self    Paperwork requested: No   Reason for Visit /Presenting Problem: Pt is referred by her PCP for anxiety that pt reports is related to family siutation.  Pt reports her 35 y/o Daughter is recently divorced following years of struggles w/ alcoholism and relapses/decisions during and following her 2 residential tx stays.  Pt reports her daughter's 3 children ages 50, 64, and 18y/o no longer have any contact w/her and her ex husband only interactions is if needs to and not amicable.  Pt reports my dauther is extremely emotionally needy and now her daughter comes to her and continues to insist she talks to her ex on her behalf.  Pt has made it clear that she is not going to do that.  Pt also states "I'm not proud of her, I have been very anger, and working through a lot of guilt.  I don't know what to do for me anymore.  Pt also doesn't trust her daughter and that she states she will be sober for 2 years in November.  Pt reports that given her behavior and pushing boundaries there are periods that they don't talk much.  Pt reports that this stress w/ her daughter has been present for the past 8 years and has intensified in the past year with current symptoms. "I need support and direction for myself (how to handle this anxiety and stressor)"      Mental Status Exam: Appearance:   Well Groomed     Behavior:  Appropriate  Motor:  Normal  Speech/Language:   Clear and Coherent and Normal Rate  Affect:  Congruent  Mood:  anxious  Thought process:  normal  Thought content:    WNL   Sensory/Perceptual disturbances:    WNL  Orientation:  oriented to person, place, time/date, and situation  Attention:  Good  Concentration:  Good  Memory:  WNL Some difficulty/slowness w/ recalling words  Fund of knowledge:   Good  Insight:    Good  Judgment:   Good  Impulse Control:  Good   Reported Symptoms:  Pt reports feeling overwhelmed and stressed w/ situation w/ daughter. Pt reports worry for her daughter, anxious and avoidant of interactions w/, pt reports feelings of guilt as a mother w/ daughter's struggles.  Pt denies feeling depressed.  Pt reports no sleep disturbance.  Pt reports doubt re: right steps to take. Pt also feels stress impacting her cognition- more difficulty formulating what wants to say.  Pt reports feels restless and hyper.    Risk Assessment: Danger to Self:  No Self-injurious Behavior: No Danger to Others: No Duty to Warn:no Physical Aggression / Violence:No  Access to Firearms a concern: No  Gang Involvement:No  Patient / guardian was educated about steps to take if suicide or homicide risk level increases between visits: n/a While future psychiatric events cannot be accurately predicted, the patient does not currently require acute inpatient psychiatric care and does not currently meet [facility] involuntary commitment criteria.  Substance Abuse History: Current substance abuse: No     Past Psychiatric History:  Previous psychological history is significant for anxiety pt was prescribed Celexa years ago by her doctor to help her w/ being less hyper as related to her anxiety. Outpatient Providers:no counseling in the past.   History of Psych Hospitalization: No  Psychological Testing:  none  Abuse History:  Victim of: No.,  none    Report needed: No. Victim of Neglect:No. Perpetrator of  none   Witness / Exposure to Domestic Violence: No   Protective Services Involvement: No  Witness to [employer] Violence:  No  Trauma experienced when  her brother died by suicide in 02/17/10.  Pt stated "last person we expected".     Family History:  Family History  Problem Relation Age of Onset   Heart disease Father    Cancer Father        lung-nonsmoker   Breast cancer Sister 88   Colon cancer Sister 9   Epilepsy Mother        at older age   Dementia Mother        ?uncertain type   Hip fracture Mother        x2   Other Brother        suicide   Other Maternal Grandfather        didn't go to doctor   Esophageal cancer Neg Hx    Stomach cancer Neg Hx   No family hx alcoholism.  No mental hx dx in family that aware of.   Living situation: the patient lives with their spouse.  Sexual Orientation: Straight  Relationship Status: married for 55 years to her husband, Jonny Ruiz.  Pt reports marriage is good and wonderful husband.    If a parent, number of children / ages:3 children. 55 y/o son married and lives in [place] with his wife- no children, 48y/o daughter divorced living in [place]- (3 children 21y/o son in [place], 20y/o son in [employer] and 18y/o daughter at [place]),  and 46y/o  son in [place]  w/ 2 children 17y/o son 15y/o daughter.    Pt reports she has good relationship w/ her son in law, Judie Grieve and her grandchildren by her daughter.  They do not have any contact w/ her since the separation- divorced since December 2023.[name]  Pt reports good relationship w/ her sons.  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      Support Systems: spouse friends good friends and husband.   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  Retired early- about 20 years following husband's accident.  Husband was a Clinical research associate an and she was his Manufacturing engineer Service: No   Educational History: Education: college graduate  Religion/Sprituality/World View: Ephriam Knuckles- attend church.    Any cultural differences that may affect / interfere with treatment:  not applicable    Recreation/Hobbies:   Hospice garden volunteer.  Part of a Women's club and garden club.  Knitting group.    Stressors: Other: relationship w/ daughter and her addiction    Strengths: Supportive Relationships and Self Advocate  Barriers:  none   Legal History: Pending legal issue / charges: The patient has no significant history of legal issues. History of legal issue / charges:  none  Medical History/Surgical History: reviewed Past Medical History:  Diagnosis Date   A-fib (HCC)    Acid reflux    Acute cystitis with positive culture 03/06/2014   Anemia    Anxiety    Arthritis  Baker cyst, left 04/06/2016   Aspirated 04/06/2016   Breast cancer (HCC) 11/23/2007   right, ER/PR -, HER 2 -   Cognitive deficits 09/25/2014   2016 Very mild, could be related to meds    Degenerative arthritis of left knee 04/06/2016   Injection and aspiration 04/06/2016   Degenerative arthritis of right knee 09/27/2015   Depression    Esophageal stricture    Gastritis 10/2007   History of chemotherapy    neoadjuvant chemo, last dose 03/2008   History of radiation therapy 07/25/08 -09/11/08   right breast   Hx of colonic polyps 08/11/2016   Hyperlipidemia    Migraines    rare   Myalgia 07/18/2013   5/15 multiple site    OSA (obstructive sleep apnea)    severe OSA AHI 34, intolerant to PAP and now s/p hypoglossal nerve stimulator implant   Osteopenia    Rupture of quadriceps tendon 03/23/2017    Past Surgical History:  Procedure Laterality Date   ATRIAL FIBRILLATION ABLATION N/A 10/07/2018   Procedure: ATRIAL FIBRILLATION ABLATION;  Surgeon: Regan Lemming, MD;  Location: [facility];  Service: Cardiovascular;  Laterality: N/A;   BREAST LUMPECTOMY  06/26/2008   right, CA   XRT, chemo   COLONOSCOPY     DRUG INDUCED ENDOSCOPY N/A 02/21/2020   Procedure: DRUG INDUCED ENDOSCOPY;  Surgeon: Christia Reading, MD;  Location: [facility];  Service: ENT;  Laterality:  N/A;   EYE SURGERY Bilateral    growth removed per right thigh     as teen - mole   IMPLANTATION OF HYPOGLOSSAL NERVE STIMULATOR N/A 05/15/2020   Procedure: IMPLANTATION OF HYPOGLOSSAL NERVE STIMULATOR;  Surgeon: Christia Reading, MD;  Location: [facility] OR;  Service: ENT;  Laterality: N/A;   JOINT REPLACEMENT  12/31/2015   RTK   QUADRICEPS TENDON REPAIR Right 09/14/2016   Procedure: OPEN REPAIR QUADRICEP TENDON;  Surgeon: Durene Romans, MD;  Location: [facility];  Service: Orthopedics;  Laterality: Right;   TOTAL KNEE ARTHROPLASTY Right 12/31/2015   Procedure: RIGHT TOTAL KNEE ARTHROPLASTY;  Surgeon: Durene Romans, MD;  Location: [facility];  Service: Orthopedics;  Laterality: Right;   TOTAL KNEE ARTHROPLASTY Left 10/11/2020   Procedure: TOTAL KNEE ARTHROPLASTY;  Surgeon: Tarry Kos, MD;  Location: [facility] OR;  Service: Orthopedics;  Laterality: Left;   UPPER GASTROINTESTINAL ENDOSCOPY      Medications: Current Outpatient Medications  Medication Sig Dispense Refill   acetaminophen (TYLENOL) 500 MG tablet Take 500 mg by mouth every 6 (six) hours as needed.     Biotin 5000 MCG TABS Take 5,000 mcg by mouth daily.     Calcium-Magnesium-Zinc (CAL-MAG-ZINC PO) Take 1 tablet by mouth daily. 600 mg     Cholecalciferol (VITAMIN D) 50 MCG (2000 UT) tablet Take 2,000 Units by mouth daily.     citalopram (CELEXA) 40 MG tablet TAKE 1 TABLET BY MOUTH EVERY DAY 90 tablet 0   Cyanocobalamin (VITAMIN B-12 PO) Take 2,500 mcg by mouth daily.     denosumab (PROLIA) 60 MG/ML SOSY injection Inject 60 mg into the skin every 6 (six) months.     diclofenac Sodium (VOLTAREN) 1 % GEL Apply 2 g topically 4 (four) times daily.     diphenhydrAMINE (BENADRYL) 25 MG tablet Take 25 mg by mouth every 6 (six) hours as needed for allergies.     Evolocumab (REPATHA SURECLICK) 140 MG/ML SOAJ Inject 140 mg into the skin every 14 (fourteen) days. 6 mL 3  famotidine (PEPCID) 40 MG tablet TAKE 1 TABLET BY MOUTH EVERY DAY 90 tablet 0    fluticasone (FLONASE) 50 MCG/ACT nasal spray SPRAY 2 SPRAYS INTO EACH NOSTRIL EVERY DAY 48 mL 0   Omega-3 Fatty Acids (FISH OIL) 1200 MG CAPS Take 1,200 mg by mouth daily.     Probiotic Product (PROBIOTIC DAILY PO) Take 1 tablet by mouth daily.     SUMAtriptan (IMITREX) 100 MG tablet Take 1 tablet (100 mg total) by mouth once for 1 dose. Can repeat dose in 2 hours if symptoms persist. Max dose 2 tablets in 24 hours. 9 tablet 5   zolpidem (AMBIEN) 5 MG tablet TAKE 0.5-1 TABLETS (2.5-5 MG TOTAL) BY MOUTH AT BEDTIME AS NEEDED FOR SLEEP. 90 tablet 0   No current facility-administered medications for this visit.    Allergies  Allergen Reactions   Boniva [Ibandronic Acid] Nausea Only   Hydrocodone-Acetaminophen Other (See Comments)    "Does not relieve pain", patient reported.  Can take Tylenol.   Lipitor [Atorvastatin] Other (See Comments)    Muscle and joint pain(s)   Pt request removal of this intolerance   Tramadol Other (See Comments)    "Does not help pain."- patient reported.    Diagnoses:  Anxiety state  Plan of Care: Pt is a 77y/o married female seeking counseling to assist coping w/ current anxiety and stressors.  Pt reports dx w/ anxiety previously and prescribed Celexa 40 mg for years.  Pt reports that over the past year her anxiety has exacerbated w/ the stress of her interactions w/ her daughter and daughter's situation.  Pt is setting boundaries w/ her daughter that daughter isn't respecting.  Pt is struggling w/ feeling guilt as well. Pt denies depressed mood.  Pt to f/u w/ counseling biweekly and to f/u w/ her PCP as needed.   Individualized Treatment Plan Strengths: seeking counseling,  good supports and involvement w/ hobbies and community  Supports: good friends and husband.    Goal/Needs for Treatment:  In order of importance to patient 1) coping w/ stress and reducing anxiety 2) --- 3) ---   Client Statement of Needs: "I need support and direction for myself (how to  handle this anxiety and stressor)"     Treatment Level:outpatient counseling  Symptoms:increased anxiety/feeling overwhelmed and difficulty concentrating and making decisions w/ how to cope  Client Treatment Preferences: counseling Every 2 week.  F/u w/ PCP   Healthcare consumer's goal for treatment:  Counselor, Forde Radon, [facility] will support the patient's ability to achieve the goals identified. Cognitive Behavioral Therapy, Assertive Communication/Conflict Resolution Training, Relaxation Training, ACT, Humanistic and other evidenced-based practices will be used to promote progress towards healthy functioning.   Healthcare consumer will: Actively participate in therapy, working towards healthy functioning.    *Justification for Continuation/Discontinuation of Goal: R=Revised, O=Ongoing, A=Achieved, D=Discontinued  Goal 1) Pt to identify and engage in coping strategies, self care and boundaries w/ daughter to manage stressors related to this relationship AEB pt report and therapist observation.  Baseline date 11/23/22: Progress towards goal 0; How Often - Daily Target Date Goal Was reviewed Status Code Progress towards goal/Likert rating  11/23/23                  This plan has been reviewed and created by the following participants:  This plan will be reviewed at least every 12 months. Date Behavioral Health Clinician Date Guardian/Patient   11/23/22 [facility]  11/23/22 Verbal Consent Provided  Forde Radon, [facility]

## 2022-11-30 ENCOUNTER — Other Ambulatory Visit (HOSPITAL_COMMUNITY): Payer: Self-pay

## 2022-11-30 ENCOUNTER — Other Ambulatory Visit: Payer: Self-pay

## 2022-11-30 ENCOUNTER — Encounter: Payer: Self-pay | Admitting: Cardiology

## 2022-11-30 ENCOUNTER — Ambulatory Visit: Payer: PPO | Attending: Cardiology | Admitting: Cardiology

## 2022-11-30 VITALS — BP 110/80 | HR 88 | Ht 67.0 in | Wt 128.0 lb

## 2022-11-30 DIAGNOSIS — I700 Atherosclerosis of aorta: Secondary | ICD-10-CM | POA: Diagnosis not present

## 2022-11-30 DIAGNOSIS — I480 Paroxysmal atrial fibrillation: Secondary | ICD-10-CM | POA: Diagnosis not present

## 2022-11-30 DIAGNOSIS — E785 Hyperlipidemia, unspecified: Secondary | ICD-10-CM | POA: Diagnosis not present

## 2022-11-30 MED ORDER — METOPROLOL TARTRATE 25 MG PO TABS: 25.0000 mg | ORAL_TABLET | Freq: Four times a day (QID) | ORAL | 3 refills | Status: DC | PRN

## 2022-11-30 MED ORDER — METOPROLOL TARTRATE 25 MG PO TABS
25.0000 mg | ORAL_TABLET | Freq: Four times a day (QID) | ORAL | 3 refills | Status: DC | PRN
Start: 2022-11-30 — End: 2022-11-30
  Filled 2022-11-30: qty 20, 5d supply, fill #0
  Filled 2022-11-30: qty 10, 3d supply, fill #0

## 2022-11-30 NOTE — Patient Instructions (Signed)
Medication Instructions:  You may take Metoprolol Tartrate 25 mg as needed for rapid heart rate/At Fibrillation. Continue all other medications as listed.  *If you need a refill on your cardiac medications before your next appointment, please call your pharmacy*  Follow-Up: At [facility], you and your health needs are our priority.  As part of our continuing mission to provide you with exceptional heart care, we have created designated Provider Care Teams.  These Care Teams include your primary Cardiologist (physician) and Advanced Practice Providers (APPs -  Physician Assistants and Nurse Practitioners) who all work together to provide you with the care you need, when you need it.  We recommend signing up for the patient portal called "MyChart".  Sign up information is provided on this After Visit Summary.  MyChart is used to connect with patients for Virtual Visits (Telemedicine).  Patients are able to view lab/test results, encounter notes, upcoming appointments, etc.  Non-urgent messages can be sent to your provider as well.   To learn more about what you can do with MyChart, go to [email or web address].    Your next appointment:   1 year(s)  Provider:   Donato Schultz, MD

## 2022-11-30 NOTE — Progress Notes (Signed)
Cardiology Office Note:  .   Date:  11/30/2022  ID:  [name], DOB 09-26-1945, MRN [id number] PCP: Karie Georges, MD   HeartCare Providers Cardiologist:  Donato Schultz, MD Electrophysiologist:  Will Jorja Loa, MD  Sleep Medicine:  Armanda Magic, MD    History of Present Illness: .   Brittany [name] is a 77 y.o. female Discussed with the use of AI scribe software   History of Present Illness   The 77 year old patient with a history of paroxysmal atrial fibrillation, aortic atherosclerosis, and obstructive sleep apnea presents for a follow-up visit. She underwent atrial fibrillation ablation in 2020 and has been doing well since, with no recurrence of atrial fibrillation symptoms. She also had a coronary calcium score of zero and negative lower extremity dopplers. A nuclear stress test in 2021 showed an ejection fraction of 56% with a small apical defect likely representing apical thinning, which was considered a low-risk study.  The patient also has obstructive sleep apnea and has been using an INSPIRE device or hypoglossal nerve stimulator, which she reports has significantly improved her sleep quality compared to previous CPAP use. She is also on Repatha for statin intolerance, with a recent LDL cholesterol level of 90.  The patient has had three knee surgeries and recent foot surgery, which has limited her ability to maintain regular physical activity. She expresses difficulty in returning to her usual walking routine. Despite these challenges, the patient is generally doing well and is pleased with her current health status.        Studies Reviewed: [name]   EKG Interpretation Date/Time:  Monday November 30 2022 08:05:41 EDT Ventricular Rate:  88 PR Interval:  130 QRS Duration:  96 QT Interval:  392 QTC Calculation: 474 R Axis:   77  Text Interpretation: Normal sinus rhythm Normal ECG When compared with ECG of 08-Nov-2018 09:03, Premature supraventricular complexes are  no longer Present Confirmed by Donato Schultz ([id number]) on 11/30/2022 8:11:30 AM    LABS LDL: 90 (10/15/2022) Hemoglobin: 13.3 Triglycerides: 74 Creatinine: 0.6  RADIOLOGY Coronary calcium score: 0 (04/22/2021)  DIAGNOSTIC Lower extremity Doppler: No DVT (04/10/2021) Nuclear stress test: Ejection fraction 56%, small apical defect likely representing apical thinning, low risk (10/23/2019) Echocardiogram: Posterior prolapse, trivial mitral regurgitation, ejection fraction 60% (11/2017) EKG: Normal  Risk Assessment/Calculations:    Physical Exam:   VS:  BP 110/80 (BP Location: Right Arm, Patient Position: Sitting, Cuff Size: Normal)   Pulse 88   Ht 5\' 7"  (1.702 m)   Wt 128 lb (58.1 kg)   LMP 03/02/1994 (Approximate)   SpO2 97%   BMI 20.05 kg/m    Wt Readings from Last 3 Encounters:  11/30/22 128 lb (58.1 kg)  11/11/22 129 lb (58.5 kg)  11/03/22 128 lb 9.6 oz (58.3 kg)    GEN: Well nourished, well developed in no acute distress NECK: No JVD; No carotid bruits CARDIAC: RRR, no murmurs, rubs, gallops RESPIRATORY:  Clear to auscultation without rales, wheezing or rhonchi  ABDOMEN: Soft, non-tender, non-distended EXTREMITIES:  No edema; No deformity   ASSESSMENT AND PLAN: .   Assessment and Plan    Paroxysmal Atrial Fibrillation Status post ablation with no reported recurrence. -Continue current management. -Provide Metoprolol as needed for potential episodes.  Obstructive Sleep Apnea Status post INSPIRE device placement. Reports significant improvement in sleep quality. -Continue current management.  Hyperlipidemia On Repatha due to statin intolerance. LDL 90. -Continue Repatha.  General Health Maintenance -Encourage regular physical activity. -Follow-up in  1 year.             ADDEN: Apple watch noted.  If atrial fibrillation detected, we will resume Eliquis.  We discussed stroke risks.  Signed, Donato Schultz, MD

## 2022-11-30 NOTE — Telephone Encounter (Signed)
Pt's medication was sent to pt's pharmacy as requested. Confirmation received.  °

## 2022-12-01 ENCOUNTER — Ambulatory Visit: Payer: PPO | Admitting: Psychology

## 2022-12-07 ENCOUNTER — Encounter: Payer: Self-pay | Admitting: Family Medicine

## 2022-12-07 ENCOUNTER — Inpatient Hospital Stay: Payer: PPO | Attending: Hematology and Oncology | Admitting: Hematology and Oncology

## 2022-12-07 ENCOUNTER — Ambulatory Visit: Payer: PPO | Admitting: Psychology

## 2022-12-07 VITALS — BP 115/66 | HR 91 | Temp 98.1°F | Resp 17 | Wt 129.1 lb

## 2022-12-07 DIAGNOSIS — Z923 Personal history of irradiation: Secondary | ICD-10-CM | POA: Insufficient documentation

## 2022-12-07 DIAGNOSIS — C50911 Malignant neoplasm of unspecified site of right female breast: Secondary | ICD-10-CM | POA: Diagnosis not present

## 2022-12-07 DIAGNOSIS — Z79899 Other long term (current) drug therapy: Secondary | ICD-10-CM | POA: Diagnosis not present

## 2022-12-07 DIAGNOSIS — Z853 Personal history of malignant neoplasm of breast: Secondary | ICD-10-CM | POA: Insufficient documentation

## 2022-12-07 DIAGNOSIS — Z9221 Personal history of antineoplastic chemotherapy: Secondary | ICD-10-CM | POA: Diagnosis not present

## 2022-12-07 DIAGNOSIS — Z171 Estrogen receptor negative status [ER-]: Secondary | ICD-10-CM

## 2022-12-07 DIAGNOSIS — Z885 Allergy status to narcotic agent status: Secondary | ICD-10-CM | POA: Diagnosis not present

## 2022-12-07 DIAGNOSIS — K5909 Other constipation: Secondary | ICD-10-CM

## 2022-12-07 NOTE — Progress Notes (Signed)
Patient Care Team: Karie Georges, MD as PCP - General (Family Medicine) Jake Bathe, MD as PCP - Cardiology (Cardiology) Regan Lemming, MD as PCP - Electrophysiology (Cardiology) Quintella Reichert, MD as PCP - Sleep Medicine (Cardiology) [name], Valentino Hue, MD (Inactive) as Consulting Physician (Hematology and Oncology) Quintella Reichert, MD as Consulting Physician (Sleep Medicine) Verner Chol, [facility] (Inactive) as Pharmacist (Pharmacist) Tarry Kos, MD as Attending Physician (Orthopedic Surgery)  DIAGNOSIS:  Encounter Diagnosis  Name Primary?   Malignant neoplasm of right breast in female, estrogen receptor negative, unspecified site of breast (HCC) Yes    SUMMARY OF ONCOLOGIC HISTORY: Oncology History  Breast cancer, right breast (HCC)  11/22/2007 Breast US   Right breast with irregular, hypoechoic mass measuring up to 1.9 cm. Coorelated with palpable finding.  LNs appeared normal.     11/22/2007 Mammogram   Right breast with suspicious 1.5 cm ill-defined density with associated microcalcs.   11/23/2007 Initial Biopsy   Right breast biopsy revealed grade 2-3 IDC. ER- (0%)/PR- (0%)/HER2 neg.  Ki76 50%.    11/23/2007 Initial Diagnosis   T1cN0, Stage IA right breast invasive ductal carcinoma   12/08/2007 Breast MRI   Solitary abnormally enhancing right breast mass at 8 o'clock location, corresponding with bx-proven IDC. No MRI evidence of multifocal/multicentric or contralateral disease.    12/15/2007 Echocardiogram   Pre-treatment EF: 60%.    12/27/2007 -  Neo-Adjuvant Chemotherapy   Docetaxel/Gemcitabine/Bevacizumab x 4 cycles,  then Doxorubicin/Cytoxan x 4 cycles with Bevacizumab x 2 cycles per NSABP Protocol B-40.  Pt received last Bevacizumab dose in 03/2008.   06/12/2008 Echocardiogram   EF normal: 65-70%   06/26/2008 Definitive Surgery   Right lumpectomy with right axillary SLNB revealed no residual carcinoma and complete pathologic response.  Margins  neg.  0/2 SLN positive for metastatic carcinoma.    06/26/2008 Pathologic Stage   ypT0 ypN0(i-): No evidence of disease.     - 09/11/2008 Radiation Therapy   Completed right breast radiation therapy. Total dose 61 Gy.  Completed 08/2008.   06/13/2009 Echocardiogram   EF normal: 55-60%.    12/2011 Miscellaneous   Began osteoporosis treatment with Zoledronate.  Completed therapy in 10/2012.    05/26/2013 Imaging   DEXA scan: T-score -2.3, low bone mass. Improvement in BMD of lumbar spine since last scan 05/2011.  Recs to repeat in 2 years.    11/30/2013 Miscellaneous   Began osteoporosis treatment with Prolia/Denosumab.  Receives infusions every 6 months.    02/09/2014 Echocardiogram   Echocardiographic Stress Test: negative with recommendations to repeat in 1 year per cardiology specialists.    06/29/2014 Survivorship   Survivorship Care Plan mailed to patient at her request in lieu of an in-person meeting in Survivorship Clinic.      CHIEF COMPLIANT: Surveillance of breast cancer    History of Present Illness   The patient, a 77 year old with a history of triple negative breast cancer, presents for a routine follow-up. She reports good health and denies any new health issues or concerns over the past year. She has maintained a stable weight and continues to stay active, although less so than in the past due to multiple orthopedic surgeries. She has had three knee surgeries, two on the same knee, and foot surgery within the last year, which has affected her balance and limited her walking. She denies any chest pain or discomfort.       ALLERGIES:  is allergic to boniva [ibandronic acid], hydrocodone-acetaminophen,  lipitor [atorvastatin], and tramadol.  MEDICATIONS:  Current Outpatient Medications  Medication Sig Dispense Refill   acetaminophen (TYLENOL) 500 MG tablet Take 500 mg by mouth every 6 (six) hours as needed.     Biotin 5000 MCG TABS Take 5,000 mcg by mouth daily.      Calcium-Magnesium-Zinc (CAL-MAG-ZINC PO) Take 1 tablet by mouth daily. 600 mg     Cholecalciferol (VITAMIN D) 50 MCG (2000 UT) tablet Take 2,000 Units by mouth daily.     citalopram (CELEXA) 40 MG tablet TAKE 1 TABLET BY MOUTH EVERY DAY 90 tablet 0   Cyanocobalamin (VITAMIN B-12 PO) Take 2,500 mcg by mouth daily.     denosumab (PROLIA) 60 MG/ML SOSY injection Inject 60 mg into the skin every 6 (six) months.     diphenhydrAMINE (BENADRYL) 25 MG tablet Take 25 mg by mouth every 6 (six) hours as needed for allergies.     Evolocumab (REPATHA SURECLICK) 140 MG/ML SOAJ Inject 140 mg into the skin every 14 (fourteen) days. 6 mL 3   famotidine (PEPCID) 40 MG tablet TAKE 1 TABLET BY MOUTH EVERY DAY 90 tablet 0   fluticasone (FLONASE) 50 MCG/ACT nasal spray SPRAY 2 SPRAYS INTO EACH NOSTRIL EVERY DAY 48 mL 0   metoprolol tartrate (LOPRESSOR) 25 MG tablet Take 1 tablet (25 mg total) by mouth every 6 (six) hours as needed. For A-Fib 30 tablet 3   Omega-3 Fatty Acids (FISH OIL) 1200 MG CAPS Take 1,200 mg by mouth daily.     Probiotic Product (PROBIOTIC DAILY PO) Take 1 tablet by mouth daily.     SUMAtriptan (IMITREX) 100 MG tablet Take 1 tablet (100 mg total) by mouth once for 1 dose. Can repeat dose in 2 hours if symptoms persist. Max dose 2 tablets in 24 hours. 9 tablet 5   zolpidem (AMBIEN) 5 MG tablet TAKE 0.5-1 TABLETS (2.5-5 MG TOTAL) BY MOUTH AT BEDTIME AS NEEDED FOR SLEEP. 90 tablet 0   No current facility-administered medications for this visit.    PHYSICAL EXAMINATION: ECOG PERFORMANCE STATUS: 1 - Symptomatic but completely ambulatory  Vitals:   12/07/22 1035  BP: 115/66  Pulse: 91  Resp: 17  Temp: 98.1 F (36.7 C)  SpO2: 100%   Filed Weights   12/07/22 1035  Weight: 129 lb 1.6 oz (58.6 kg)    Physical Exam breast exam: No palpable lumps or nodules in the breast.        (exam performed in the presence of a chaperone)  LABORATORY DATA:  I have reviewed the data as listed     Latest Ref Rng & Units 11/03/2022   11:21 AM 04/02/2022    1:43 PM 12/05/2021   11:20 AM  CMP  Glucose 70 - 99 mg/dL 81   99   BUN 6 - 23 mg/dL 12   10   Creatinine 8.65 - 1.20 mg/dL 7.84   6.96   Sodium 295 - 145 mEq/L 136   136   Potassium 3.5 - 5.1 mEq/L 4.3   4.4   Chloride 96 - 112 mEq/L 100   101   CO2 19 - 32 mEq/L 29   32   Calcium 8.4 - 10.5 mg/dL 28.4   9.5   Total Protein 6.0 - 8.3 g/dL 7.4  7.1  7.5   Total Bilirubin 0.2 - 1.2 mg/dL 0.5  0.3  0.4   Alkaline Phos 39 - 117 U/L 59  68  63   AST 0 - 37 U/L 18  15  15   ALT 0 - 35 U/L 10  9  10      Lab Results  Component Value Date   WBC 5.7 11/03/2022   HGB 13.3 11/03/2022   HCT 40.6 11/03/2022   MCV 95.7 11/03/2022   PLT 304.0 11/03/2022   NEUTROABS 3.6 11/03/2022    ASSESSMENT & PLAN:  Breast cancer, right breast (HCC) 11/2007: Clinical T1CN0 stage Ia grade 2-3 IDC triple negative Ki-67 50% January 2010: Completed neoadjuvant chemotherapy with Taxotere gemcitabine bevacizumab followed by Adriamycin Cytoxan bevacizumab NSABP B40 April 2010: Right lumpectomy: Complete pathologic response July 2010: Completed adjuvant radiation   Breast cancer surveillance: 1.  Breast exam 12/07/2022: Benign 2. mammogram 03/10/2022: At [facility] benign breast density category C   Patient wishes to follow-up once a year indefinitely.     No orders of the defined types were placed in this encounter.  The patient has a good understanding of the overall plan. she agrees with it. she will call with any problems that may develop before the next visit here. Total time spent: 30 mins including face to face time and time spent for planning, charting and co-ordination of care   Tamsen Meek, MD 12/07/22

## 2022-12-07 NOTE — Assessment & Plan Note (Signed)
11/2007: Clinical T1CN0 stage Ia grade 2-3 IDC triple negative Ki-67 50% January 2010: Completed neoadjuvant chemotherapy with Taxotere gemcitabine bevacizumab followed by Adriamycin Cytoxan bevacizumab NSABP B40 April 2010: Right lumpectomy: Complete pathologic response July 2010: Completed adjuvant radiation   Breast cancer surveillance: 1.  Breast exam 12/07/2022: Benign 2. mammogram 03/10/2022: At [facility] benign breast density category C   Patient wishes to follow-up once a year indefinitely.

## 2022-12-08 ENCOUNTER — Ambulatory Visit: Payer: PPO | Admitting: Psychology

## 2022-12-08 DIAGNOSIS — F411 Generalized anxiety disorder: Secondary | ICD-10-CM

## 2022-12-08 NOTE — Progress Notes (Unsigned)
[facility] Counselor/Therapist Progress Note  Patient ID: Brittany [name], MRN: [id number],    Date: 12/08/2022  Time Spent: 1:31pm-2:25pm     Treatment Type: Individual Therapy  pt is seen for a virtual video visit via caregility.  Pt joins from her home, reporting privacy, and counselor from her home office.  Pt consents to virtual visit and is aware of limitations of such visits.    Reported Symptoms: anxious, angry w/ daughter, guilt  Mental Status Exam: Appearance:  Well Groomed     Behavior: Appropriate  Motor: Normal  Speech/Language:  Normal Rate  Affect: Appropriate  Mood: anxious and irritable  Thought process: normal  Thought content:   WNL  Sensory/Perceptual disturbances:   WNL  Orientation: oriented to person, place, time/date, and situation  Attention: Good  Concentration: Good  Memory: WNL  Fund of knowledge:  Good  Insight:   Good  Judgment:  Good  Impulse Control: Good   Risk Assessment: Danger to Self:  No Self-injurious Behavior: No Danger to Others: No Duty to Warn:no Physical Aggression / Violence:No  Access to Firearms a concern: No  Gang Involvement:No   Subjective: Counselor assessed pt current functioning per pt report.  Processed w/ pt interactions w/ daughter and relationship.  Discussed related emotions.  Explored boundaries set and how needs to be consistent w/ follow through and not further engaging.  Assisted pt in identifying distortions and challenging.  Pt affect wnl.  Pt reports continued difficult interactions w/ daughter creating anxiety around, irritability and anger  pt reports she has continued to state boundaries and daughter continues to bring up requests and expectations that will assist her in connecting w/ her ex and her children.  Pt reports some worry if not "there for her" she will drink again.  Pt also feesl guilt that can't fix for daughter.  Pt able to recognize both distortions and challenge.    Interventions:  Cognitive Behavioral Therapy, Assertiveness/Communication, and boundaries  Diagnosis:Anxiety state  Plan: Pt to f/u w/ biweekly counseling.  Pt to f/u as scheduled w/ PCP.    Individualized Treatment Plan Strengths: seeking counseling,  good supports and involvement w/ hobbies and community  Supports: good friends and husband.     Goal/Needs for Treatment:  In order of importance to patient 1) coping w/ stress and reducing anxiety 2) --- 3) ---    Client Statement of Needs: "I need support and direction for myself (how to handle this anxiety and stressor)"      Treatment Level:outpatient counseling  Symptoms:increased anxiety/feeling overwhelmed and difficulty concentrating and making decisions w/ how to cope  Client Treatment Preferences: counseling Every 2 week.  F/u w/ PCP    Healthcare consumer's goal for treatment:   Counselor, Forde Radon, [facility] will support the patient's ability to achieve the goals identified. Cognitive Behavioral Therapy, Assertive Communication/Conflict Resolution Training, Relaxation Training, ACT, Humanistic and other evidenced-based practices will be used to promote progress towards healthy functioning.    Healthcare consumer will: Actively participate in therapy, working towards healthy functioning.     *Justification for Continuation/Discontinuation of Goal: R=Revised, O=Ongoing, A=Achieved, D=Discontinued   Goal 1) Pt to identify and engage in coping strategies, self care and boundaries w/ daughter to manage stressors related to this relationship AEB pt report and therapist observation.  Baseline date 11/23/22: Progress towards goal 0; How Often - Daily Target Date Goal Was reviewed Status Code Progress towards goal/Likert rating  11/23/23  This plan has been reviewed and created by the following participants:  This plan will be reviewed at least every 12 months. Date Behavioral Health Clinician Date  Guardian/Patient   11/23/22 [facility] Ophelia Charter [facility]       11/23/22 Verbal Consent Provided                                 Forde Radon [facility]

## 2022-12-09 ENCOUNTER — Telehealth: Payer: Self-pay | Admitting: Hematology and Oncology

## 2022-12-09 ENCOUNTER — Encounter: Payer: Self-pay | Admitting: Family Medicine

## 2022-12-09 ENCOUNTER — Telehealth: Payer: Self-pay | Admitting: *Deleted

## 2022-12-09 MED ORDER — SUMATRIPTAN SUCCINATE 100 MG PO TABS: 100.0000 mg | ORAL_TABLET | ORAL | 0 refills | Status: DC | PRN

## 2022-12-09 NOTE — Telephone Encounter (Signed)
Brittany [name] 10/7 LOS Left patietn a message in regards to scheduled appointment times/dates for follow up with provider

## 2022-12-09 NOTE — Telephone Encounter (Signed)
[facility] faxed a refill request for Sumatriptan 100mg -#9-(no sigs noted).  Message sent to Dr Casimiro Needle as last Rx was given by Dr Hassan Rowan.

## 2022-12-09 NOTE — Telephone Encounter (Signed)
Ok to refill 

## 2022-12-09 NOTE — Telephone Encounter (Signed)
Rx done. 

## 2022-12-13 ENCOUNTER — Other Ambulatory Visit: Payer: Self-pay | Admitting: Family Medicine

## 2022-12-14 NOTE — Telephone Encounter (Signed)
Please send GI referral for chronic constipation. Thanks!

## 2022-12-21 ENCOUNTER — Ambulatory Visit: Payer: PPO | Admitting: Psychology

## 2022-12-21 DIAGNOSIS — F411 Generalized anxiety disorder: Secondary | ICD-10-CM | POA: Diagnosis not present

## 2022-12-21 NOTE — Progress Notes (Signed)
[facility] Counselor/Therapist Progress Note  Patient ID: Brittany [name], MRN: [id number],    Date: 12/21/2022  Time Spent: 10:01am-10:28am     Treatment Type: Individual Therapy  pt is seen for a virtual video visit via caregility.  Pt joins from her home, reporting privacy, and counselor from her home office.  Pt consents to virtual visit and is aware of limitations of such visits.    Reported Symptoms: Stressed/busy, anxiety  Mental Status Exam: Appearance:  Well Groomed     Behavior: Appropriate  Motor: Normal  Speech/Language:  Normal Rate  Affect: Appropriate  Mood: anxious  Thought process: normal  Thought content:   WNL  Sensory/Perceptual disturbances:   WNL  Orientation: oriented to person, place, time/date, and situation  Attention: Good  Concentration: Good  Memory: WNL  Fund of knowledge:  Good  Insight:   Good  Judgment:  Good  Impulse Control: Good   Risk Assessment: Danger to Self:  No Self-injurious Behavior: No Danger to Others: No Duty to Warn:no Physical Aggression / Violence:No  Access to Firearms a concern: No  Gang Involvement:No   Subjective: Counselor assessed pt current functioning per pt report.  Processed w/ pt recent added stressors.  Encouraged self care and supports and naming emotions.  Discussed acknowledged what can't control and not taking on what can't.   Pt affect congruent w/ report of stress.  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  Pt reports that has been positive to have to focus given new stressors.  Pt reports her daughter feel last week and broke her pelvis and is unable to get around.  Pt reports that also visiting couple friend in [place] this week as he has ALS that has progressed and last chance to visit.  Pt discussed worries for daughter given new situation.  Pt reflected that these things are not in her control and can't take on solving.    Interventions: Cognitive Behavioral Therapy,  Assertiveness/Communication, and boundaries  Diagnosis:Anxiety state  Plan: Pt to f/u w/ biweekly counseling.  Pt to f/u as scheduled w/ PCP.    Individualized Treatment Plan Strengths: seeking counseling,  good supports and involvement w/ hobbies and community  Supports: good friends and husband.     Goal/Needs for Treatment:  In order of importance to patient 1) coping w/ stress and reducing anxiety 2) --- 3) ---    Client Statement of Needs: "I need support and direction for myself (how to handle this anxiety and stressor)"      Treatment Level:outpatient counseling  Symptoms:increased anxiety/feeling overwhelmed and difficulty concentrating and making decisions w/ how to cope  Client Treatment Preferences: counseling Every 2 week.  F/u w/ PCP    Healthcare consumer's goal for treatment:   Counselor, Forde Radon, [facility] will support the patient's ability to achieve the goals identified. Cognitive Behavioral Therapy, Assertive Communication/Conflict Resolution Training, Relaxation Training, ACT, Humanistic and other evidenced-based practices will be used to promote progress towards healthy functioning.    Healthcare consumer will: Actively participate in therapy, working towards healthy functioning.     *Justification for Continuation/Discontinuation of Goal: R=Revised, O=Ongoing, A=Achieved, D=Discontinued   Goal 1) Pt to identify and engage in coping strategies, self care and boundaries w/ daughter to manage stressors related to this relationship AEB pt report and therapist observation.  Baseline date 11/23/22: Progress towards goal 0; How Often - Daily Target Date Goal Was reviewed Status Code Progress towards goal/Likert rating  11/23/23  This plan has been reviewed and created by the following participants:  This plan will be reviewed at least every 12 months. Date Behavioral Health Clinician Date Guardian/Patient   11/23/22 [facility] Ophelia Charter  [facility]       11/23/22 Verbal Consent Provided                                  Forde Radon [facility]

## 2023-01-01 ENCOUNTER — Other Ambulatory Visit: Payer: Self-pay

## 2023-01-01 MED ORDER — FLUTICASONE PROPIONATE 50 MCG/ACT NA SUSP: NASAL | 0 refills | Status: DC

## 2023-01-07 ENCOUNTER — Other Ambulatory Visit: Payer: Self-pay | Admitting: Family Medicine

## 2023-01-08 ENCOUNTER — Other Ambulatory Visit: Payer: Self-pay | Admitting: Family Medicine

## 2023-01-08 DIAGNOSIS — F411 Generalized anxiety disorder: Secondary | ICD-10-CM

## 2023-01-08 DIAGNOSIS — K219 Gastro-esophageal reflux disease without esophagitis: Secondary | ICD-10-CM

## 2023-01-11 ENCOUNTER — Ambulatory Visit: Payer: PPO | Admitting: Psychology

## 2023-01-11 DIAGNOSIS — F411 Generalized anxiety disorder: Secondary | ICD-10-CM | POA: Diagnosis not present

## 2023-01-11 NOTE — Progress Notes (Signed)
[facility] Counselor/Therapist Progress Note  Patient ID: Brittany [name], MRN: [id number],    Date: 01/11/2023  Time Spent: 1:30pm-1:56pm     Treatment Type: Individual Therapy  pt is seen for a virtual video visit via caregility.  Pt joins from her home, reporting privacy, and counselor from her home office.  Pt consents to virtual visit and is aware of limitations of such visits.    Reported Symptoms: frustration w/ daughter, awareness of establishing her boundaries, focus on self care.  Mental Status Exam: Appearance:  Well Groomed     Behavior: Appropriate  Motor: Normal  Speech/Language:  Normal Rate  Affect: Appropriate  Mood: frustrations  Thought process: normal  Thought content:   WNL  Sensory/Perceptual disturbances:   WNL  Orientation: oriented to person, place, time/date, and situation  Attention: Good  Concentration: Good  Memory: WNL  Fund of knowledge:  Good  Insight:   Good  Judgment:  Good  Impulse Control: Good   Risk Assessment: Danger to Self:  No Self-injurious Behavior: No Danger to Others: No Duty to Warn:no Physical Aggression / Violence:No  Access to Firearms a concern: No  Gang Involvement:No   Subjective: Counselor assessed pt current functioning per pt report.  Processed w/ pt recent stressors and interactions w/ daughter.  Explored increased insight into her inability to change daughter/situation and steps to take to focus on acceptance and setting boundaries.  Discussed her supports and her strengths and to reach out as needed.   Pt affect wnl.  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  Then, once that wrapped up went to daughter's to help her.  Pt expressed frustration w/ lack of effort daughter is putting in and expectation that other need to do for her.  Pt discussed how recognized that she is not going to change this and that can't continue same pattern w/ daughter.  Pt discussed that she feels she needs  to work on acceptance and keeping that boundary.  Pt identified that wants to work on this outside of counseling now and return if needed.  Pt identified thins for her self care and her supports.    Interventions: Cognitive Behavioral Therapy, Assertiveness/Communication, and boundaries  Diagnosis:Anxiety state  Plan: Pt reports has met needs in counseling at this time and will reach out if needed in future.    Pt to f/u as scheduled w/ PCP.    Individualized Treatment Plan Strengths: seeking counseling,  good supports and involvement w/ hobbies and community  Supports: good friends and husband.     Goal/Needs for Treatment:  In order of importance to patient 1) coping w/ stress and reducing anxiety 2) --- 3) ---    Client Statement of Needs: "I need support and direction for myself (how to handle this anxiety and stressor)"      Treatment Level:outpatient counseling  Symptoms:increased anxiety/feeling overwhelmed and difficulty concentrating and making decisions w/ how to cope  Client Treatment Preferences: counseling Every 2 week.  F/u w/ PCP    Healthcare consumer's goal for treatment:   Counselor, Brittany [name], [facility] will support the patient's ability to achieve the goals identified. Cognitive Behavioral Therapy, Assertive Communication/Conflict Resolution Training, Relaxation Training, ACT, Humanistic and other evidenced-based practices will be used to promote progress towards healthy functioning.    Healthcare consumer will: Actively participate in therapy, working towards healthy functioning.     *Justification for Continuation/Discontinuation of Goal: R=Revised, O=Ongoing, A=Achieved, D=Discontinued   Goal 1) Pt to  identify and engage in coping strategies, self care and boundaries w/ daughter to manage stressors related to this relationship AEB pt report and therapist observation.  Baseline date 11/23/22: Progress towards goal 0; How Often - Daily Target Date Goal Was  reviewed Status Code Progress towards goal/Likert rating  11/23/23                                This plan has been reviewed and created by the following participants:  This plan will be reviewed at least every 12 months. Date Behavioral Health Clinician Date Guardian/Patient   11/23/22 [facility] Brittany [name] [facility]       11/23/22 Verbal Consent Provided                                    Brittany [name] [facility]

## 2023-03-06 ENCOUNTER — Other Ambulatory Visit: Payer: Self-pay | Admitting: Family Medicine

## 2023-03-06 DIAGNOSIS — G4700 Insomnia, unspecified: Secondary | ICD-10-CM

## 2023-03-08 ENCOUNTER — Encounter: Payer: Self-pay | Admitting: *Deleted

## 2023-03-08 NOTE — Telephone Encounter (Signed)
 Will send but she will need her 6 month follow up in March-- please remind patient to schedule this.

## 2023-03-15 DIAGNOSIS — R238 Other skin changes: Secondary | ICD-10-CM | POA: Diagnosis not present

## 2023-03-15 DIAGNOSIS — Z85828 Personal history of other malignant neoplasm of skin: Secondary | ICD-10-CM | POA: Diagnosis not present

## 2023-03-15 DIAGNOSIS — L570 Actinic keratosis: Secondary | ICD-10-CM | POA: Diagnosis not present

## 2023-03-15 DIAGNOSIS — D224 Melanocytic nevi of scalp and neck: Secondary | ICD-10-CM | POA: Diagnosis not present

## 2023-03-15 DIAGNOSIS — L814 Other melanin hyperpigmentation: Secondary | ICD-10-CM | POA: Diagnosis not present

## 2023-03-15 DIAGNOSIS — D485 Neoplasm of uncertain behavior of skin: Secondary | ICD-10-CM | POA: Diagnosis not present

## 2023-03-15 DIAGNOSIS — L821 Other seborrheic keratosis: Secondary | ICD-10-CM | POA: Diagnosis not present

## 2023-03-16 ENCOUNTER — Encounter: Payer: Self-pay | Admitting: Family Medicine

## 2023-03-16 DIAGNOSIS — Z1231 Encounter for screening mammogram for malignant neoplasm of breast: Secondary | ICD-10-CM | POA: Diagnosis not present

## 2023-03-16 LAB — HM MAMMOGRAPHY

## 2023-03-17 ENCOUNTER — Ambulatory Visit: Payer: PPO

## 2023-03-17 VITALS — Ht 67.0 in | Wt 129.0 lb

## 2023-03-17 DIAGNOSIS — Z0000 Encounter for general adult medical examination without abnormal findings: Secondary | ICD-10-CM | POA: Diagnosis not present

## 2023-03-17 NOTE — Progress Notes (Signed)
 Subjective:   Brittany [name] is a 78 y.o. female who presents for Medicare Annual (Subsequent) preventive examination.  Visit Complete: Virtual I connected with  Brittany [name] on 03/17/23 by a audio enabled telemedicine application and verified that I am speaking with the correct person using two identifiers.  Patient Location: Home  Provider Location: Home Office  I discussed the limitations of evaluation and management by telemedicine. The patient expressed understanding and agreed to proceed.  Vital Signs: Because this visit was a virtual/telehealth visit, some criteria may be missing or patient reported. Any vitals not documented were not able to be obtained and vitals that have been documented are patient reported.  Patient Medicare AWV questionnaire was completed by the patient on 03/13/23; I have confirmed that all information answered by patient is correct and no changes since this date.  Cardiac Risk Factors include: advanced age (>29men, >72 women)     Objective:    Today's Vitals   03/17/23 0821  Weight: 129 lb (58.5 kg)  Height: 5\' 7"  (1.702 m)   Body mass index is 20.2 kg/m.     03/17/2023    8:31 AM 12/07/2022   10:38 AM 03/09/2022   12:48 PM 12/05/2021   11:35 AM 03/04/2021    1:30 PM 01/09/2021   10:54 PM 10/25/2020   10:23 AM  Advanced Directives  Does Patient Have a Medical Advance Directive? Yes Yes Yes Yes Yes Yes Yes  Type of Estate agent of [place];Living will Living will Healthcare Power of [place];Living will Living will Healthcare Power of [place];Living will Living will Healthcare Power of [place];Living will  Does patient want to make changes to medical advance directive?      No - Patient declined   Copy of Healthcare Power of Attorney in Chart? No - copy requested  No - copy requested  No - copy requested  No - copy requested    Current Medications (verified) Outpatient Encounter Medications as of 03/17/2023  Medication Sig    acetaminophen  (TYLENOL ) 500 MG tablet Take 500 mg by mouth every 6 (six) hours as needed.   Biotin  5000 MCG TABS Take 5,000 mcg by mouth daily.   Calcium -Magnesium -Zinc (CAL-MAG-ZINC PO) Take 1 tablet by mouth daily. 600 mg   Cholecalciferol  (VITAMIN D ) 50 MCG (2000 UT) tablet Take 2,000 Units by mouth daily.   citalopram  (CELEXA ) 40 MG tablet TAKE 1 TABLET BY MOUTH EVERY DAY   Cyanocobalamin  (VITAMIN B-12 PO) Take 2,500 mcg by mouth daily.   denosumab  (PROLIA ) 60 MG/ML SOSY injection Inject 60 mg into the skin every 6 (six) months.   diphenhydrAMINE  (BENADRYL ) 25 MG tablet Take 25 mg by mouth every 6 (six) hours as needed for allergies.   Evolocumab  (REPATHA  SURECLICK) 140 MG/ML SOAJ Inject 140 mg into the skin every 14 (fourteen) days.   famotidine  (PEPCID ) 40 MG tablet TAKE 1 TABLET BY MOUTH EVERY DAY   fluticasone  (FLONASE ) 50 MCG/ACT nasal spray SPRAY 2 SPRAYS INTO EACH NOSTRIL EVERY DAY   metoprolol  tartrate (LOPRESSOR ) 25 MG tablet Take 1 tablet (25 mg total) by mouth every 6 (six) hours as needed. For A-Fib   Omega-3 Fatty Acids (FISH OIL) 1200 MG CAPS Take 1,200 mg by mouth daily.   Probiotic Product (PROBIOTIC DAILY PO) Take 1 tablet by mouth daily.   SUMAtriptan  (IMITREX ) 100 MG tablet Take 1 tablet (100 mg total) by mouth once for 1 dose. Can repeat dose in 2 hours if symptoms persist. Max dose 2  tablets in 24 hours.   SUMAtriptan  (IMITREX ) 100 MG tablet TAKE 1 TABLET (100 MG TOTAL) BY MOUTH EVERY 2 (TWO) HOURS AS NEEDED FOR MIGRAINE. MAY REPEAT IN 2 HOURS IF HEADACHE PERSISTS OR RECURS.   zolpidem  (AMBIEN ) 5 MG tablet TAKE 0.5-1 TABLETS (2.5-5 MG TOTAL) BY MOUTH AT BEDTIME AS NEEDED FOR SLEEP.   No facility-administered encounter medications on file as of 03/17/2023.    Allergies (verified) Boniva [ibandronic acid], Hydrocodone -acetaminophen , Lipitor [atorvastatin ], and Tramadol    History: Past Medical History:  Diagnosis Date   A-fib (HCC)    Acid reflux    Acute cystitis  with positive culture 03/06/2014   Anemia    Anxiety    Arthritis    Baker cyst, left 04/06/2016   Aspirated 04/06/2016   Breast cancer (HCC) 11/23/2007   right, ER/PR -, HER 2 -   Cognitive deficits 09/25/2014   2016 Very mild, could be related to meds    Degenerative arthritis of left knee 04/06/2016   Injection and aspiration 04/06/2016   Degenerative arthritis of right knee 09/27/2015   Depression    Esophageal stricture    Gastritis 10/2007   History of chemotherapy    neoadjuvant chemo, last dose 03/2008   History of radiation therapy 07/25/08 -09/11/08   right breast   Hx of colonic polyps 08/11/2016   Hyperlipidemia    Migraines    rare   Myalgia 07/18/2013   5/15 multiple site    OSA (obstructive sleep apnea)    severe OSA AHI 34, intolerant to PAP and now s/p hypoglossal nerve stimulator implant   Osteopenia    Rupture of quadriceps tendon 03/23/2017   Past Surgical History:  Procedure Laterality Date   ATRIAL FIBRILLATION ABLATION N/A 10/07/2018   Procedure: ATRIAL FIBRILLATION ABLATION;  Surgeon: Lei Pump, MD;  Location: [facility];  Service: Cardiovascular;  Laterality: N/A;   BREAST LUMPECTOMY  06/26/2008   right, CA   XRT, chemo   COLONOSCOPY     DRUG INDUCED ENDOSCOPY N/A 02/21/2020   Procedure: DRUG INDUCED ENDOSCOPY;  Surgeon: Virgina Grills, MD;  Location: [facility];  Service: ENT;  Laterality: N/A;   EYE SURGERY Bilateral    growth removed per right thigh     as teen - mole   IMPLANTATION OF HYPOGLOSSAL NERVE STIMULATOR N/A 05/15/2020   Procedure: IMPLANTATION OF HYPOGLOSSAL NERVE STIMULATOR;  Surgeon: Virgina Grills, MD;  Location[facility];  Service: ENT;  Laterality: N/A;   JOINT REPLACEMENT  12/31/2015   RTK   QUADRICEPS TENDON REPAIR Right 09/14/2016   Procedure: OPEN REPAIR QUADRICEP TENDON;  Surgeon: Claiborne Crew, MD;  Location: [facility];  Service: Orthopedics;  Laterality: Right;   TOTAL KNEE ARTHROPLASTY Right  12/31/2015   Procedure: RIGHT TOTAL KNEE ARTHROPLASTY;  Surgeon: Claiborne Crew, MD;  Location: [facility];  Service: Orthopedics;  Laterality: Right;   TOTAL KNEE ARTHROPLASTY Left 10/11/2020   Procedure: TOTAL KNEE ARTHROPLASTY;  Surgeon: Wes Hamman, MD;  Location: [facility];  Service: Orthopedics;  Laterality: Left;   UPPER GASTROINTESTINAL ENDOSCOPY     Family History  Problem Relation Age of Onset   Heart disease Father    Cancer Father        lung-nonsmoker   Breast cancer Sister 25   Colon cancer Sister 37   Epilepsy Mother        at older age   Dementia Mother        ?uncertain type   Hip  fracture Mother        x2   Other Brother        suicide   Other Maternal Grandfather        didn't go to doctor   Esophageal cancer Neg Hx    Stomach cancer Neg Hx    Social History   Socioeconomic History   Marital status: Married    Spouse name: Not on file   Number of children: 3   Years of education: Not on file   Highest education level: Some college, no degree  Occupational History    Employer: RETIRED  Tobacco Use   Smoking status: Former    Current packs/day: 0.00    Average packs/day: 0.3 packs/day for 5.0 years (1.3 ttl pk-yrs)    Types: Cigarettes    Start date: 03/02/1962    Quit date: 03/03/1967    Years since quitting: 56.0   Smokeless tobacco: Never   Tobacco comments:    6 cig per day when she was smoking/quit years ago  Vaping Use   Vaping status: Never Used  Substance and Sexual Activity   Alcohol use: Yes    Alcohol/week: 7.0 standard drinks of alcohol    Types: 7 Cans of beer per week    Comment: 1 beer per day - 2.4 % alcholol - [employer] 55   Drug use: No   Sexual activity: Yes    Partners: Male    Birth control/protection: Post-menopausal  Other Topics Concern   Not on file  Social History Narrative   G3, P3, 1st pregnancy age 54, menopause age 65, no HRT   Social Drivers of Corporate investment banker Strain: Low Risk  (03/17/2023)   Overall  Financial Resource Strain (CARDIA)    Difficulty of Paying Living Expenses: Not hard at all  Food Insecurity: No Food Insecurity (03/17/2023)   Hunger Vital Sign    Worried About Running Out of Food in the Last Year: Never true    Ran Out of Food in the Last Year: Never true  Transportation Needs: No Transportation Needs (03/17/2023)   PRAPARE - Administrator, Civil Service (Medical): No    Lack of Transportation (Non-Medical): No  Physical Activity: Inactive (03/17/2023)   Exercise Vital Sign    Days of Exercise per Week: 0 days    Minutes of Exercise per Session: 0 min  Stress: No Stress Concern Present (03/17/2023)   [employer] of Occupational Health - Occupational Stress Questionnaire    Feeling of Stress : Not at all  Social Connections: Socially Integrated (03/17/2023)   Social Connection and Isolation Panel [NHANES]    Frequency of Communication with Friends and Family: More than three times a week    Frequency of Social Gatherings with Friends and Family: More than three times a week    Attends Religious Services: More than 4 times per year    Active Member of [employer] or Organizations: Yes    Attends Engineer, structural: More than 4 times per year    Marital Status: Married    Tobacco Counseling Counseling given: Not Answered Tobacco comments: 6 cig per day when she was smoking/quit years ago   Clinical Intake:  Pre-visit preparation completed: Yes  Pain : No/denies pain     BMI - recorded: 20.2 Nutritional Status: BMI of 19-24  Normal Nutritional Risks: None Diabetes: No  How often do you need to have someone help you when you read instructions, pamphlets, or  other written materials from your doctor or pharmacy?: 1 - Never  Interpreter Needed?: No  Information entered by :: Farris Hong LPN   Activities of Daily Living    03/17/2023    8:31 AM 03/13/2023    8:57 AM  In your present state of health, do you have any difficulty  performing the following activities:  Hearing? 0 0  Vision? 0 0  Difficulty concentrating or making decisions? 0 0  Walking or climbing stairs? 0 0  Dressing or bathing? 0 0  Doing errands, shopping? 0 0  Preparing Food and eating ? N N  Using the Toilet? N N  In the past six months, have you accidently leaked urine? N N  Do you have problems with loss of bowel control? N N  Managing your Medications? N N  Managing your Finances? N N  Housekeeping or managing your Housekeeping? N N    Patient Care Team: Aida House, MD as PCP - General (Family Medicine) Hugh Madura, MD as PCP - Cardiology (Cardiology) Lei Pump, MD as PCP - Electrophysiology (Cardiology) Jacqueline Matsu, MD as PCP - Sleep Medicine (Cardiology) [name], Rozella Cornfield, MD (Inactive) as Consulting Physician (Hematology and Oncology) Jacqueline Matsu, MD as Consulting Physician (Sleep Medicine) Alver Austin, [facility] (Inactive) as Pharmacist (Pharmacist) Wes Hamman, MD as Attending Physician (Orthopedic Surgery)  Indicate any recent Medical Services you may have received from other than [facility] providers in the past year (date may be approximate).     Assessment:   This is a routine wellness examination for Salyer.  Hearing/Vision screen Hearing Screening - Comments:: Denies hearing difficulties   Vision Screening - Comments:: Wears rx glasses - up to date with routine eye exams with  Dr Candi Chafe   Goals Addressed               This Visit's Progress     Increase physical activity (pt-stated)        Stay Active       Depression Screen    03/17/2023    8:27 AM 11/03/2022   10:06 AM 04/01/2022    5:14 PM 03/09/2022   12:46 PM 10/27/2021   11:03 AM 04/23/2021    2:37 PM 03/04/2021    1:32 PM  PHQ 2/9 Scores  PHQ - 2 Score 0 0 0 0 1 1 0  PHQ- 9 Score  0 0  1 1     Fall Risk    03/17/2023    8:31 AM 03/13/2023    8:57 AM 11/03/2022   10:05 AM 04/01/2022    5:14 PM 03/09/2022   12:48 PM  Fall  Risk   Falls in the past year? 0 0 0 1 0  Number falls in past yr: 0 0 0 0 0  Injury with Fall? 0 0 0 0 0  Risk for fall due to : No Fall Risks  No Fall Risks No Fall Risks No Fall Risks  Follow up Falls prevention discussed  Falls evaluation completed Falls evaluation completed Falls prevention discussed    MEDICARE RISK AT HOME: Medicare Risk at Home Any stairs in or around the home?: (Patient-Rptd) No If so, are there any without handrails?: (Patient-Rptd) No Home free of loose throw rugs in walkways, pet beds, electrical cords, etc?: (Patient-Rptd) Yes Adequate lighting in your home to reduce risk of falls?: (Patient-Rptd) Yes Life alert?: (Patient-Rptd) No Use of a cane, walker or w/c?: (Patient-Rptd) No Grab bars in  the bathroom?: (Patient-Rptd) Yes Shower chair or bench in shower?: (Patient-Rptd) No Elevated toilet seat or a handicapped toilet?: (Patient-Rptd) No  TIMED UP AND GO:  Was the test performed?  No    Cognitive Function:    02/18/2018   10:27 AM 02/12/2017   11:03 AM  MMSE - Mini Mental State Exam  Orientation to time 5 5  Orientation to Place 5 5  Registration 3 3  Attention/ Calculation 5 5  Recall 3 3  Language- name 2 objects 2 2  Language- repeat 1 1  Language- follow 3 step command 3 3  Language- read & follow direction 1 1  Write a sentence 1 1  Copy design 1 1  Total score 30 30        03/17/2023    8:32 AM 03/09/2022   12:48 PM 03/04/2021    1:33 PM 02/20/2020    2:52 PM  6CIT Screen  What Year? 0 points 0 points 0 points 0 points  What month? 0 points 0 points 0 points 0 points  What time? 0 points 0 points 0 points   Count back from 20 0 points 0 points 0 points 0 points  Months in reverse 0 points 0 points 0 points 0 points  Repeat phrase 0 points 0 points 2 points 0 points  Total Score 0 points 0 points 2 points     Immunizations Immunization History  Administered Date(s) Administered   Fluad Quad(high Dose 65+) 11/06/2020    Fluad Trivalent(High Dose 65+) 11/03/2022   Influenza Split 12/01/2011   Influenza Whole 12/06/2007, 11/15/2008, 01/06/2010, 11/11/2010   Influenza, High Dose Seasonal PF 12/20/2012, 11/20/2014, 11/21/2017, 11/07/2019   Influenza,inj,Quad PF,6+ Mos 11/30/2013   Influenza,inj,quad, With Preservative 11/02/2016   Influenza-Unspecified 11/27/2015, 10/31/2016, 10/27/2018   Moderna Covid-19 Vaccine Bivalent Booster 79yrs & up 02/15/2021, 12/12/2021   Moderna Sars-Covid-2 Vaccination 03/30/2019, 04/27/2019, 01/08/2020, 09/30/2020   PNEUMOCOCCAL CONJUGATE-20 02/10/2021   Pneumococcal Conjugate-13 03/22/2013   Pneumococcal Polysaccharide-23 01/31/2009, 07/31/2015, 06/20/2019   Rabies, IM 03/04/2015, 03/07/2015, 03/11/2015, 03/18/2015   Td 10/09/2009   Tdap 03/04/2015   Zoster Recombinant(Shingrix) 02/13/2017, 07/03/2017   Zoster, Live 04/18/2009    TDAP status: Up to date  Flu Vaccine status: Up to date  Pneumococcal vaccine status: Up to date  Covid-19 vaccine status: Declined, Education has been provided regarding the importance of this vaccine but patient still declined. Advised may receive this vaccine at local pharmacy or Health Dept.or vaccine clinic. Aware to provide a copy of the vaccination record if obtained from local pharmacy or Health Dept. Verbalized acceptance and understanding.  Qualifies for Shingles Vaccine? Yes   Zostavax completed Yes   Shingrix Completed?: Yes  Screening Tests Health Maintenance  Topic Date Due   COVID-19 Vaccine (7 - 2024-25 season) 11/01/2022   MAMMOGRAM  03/15/2024   Medicare Annual Wellness (AWV)  03/16/2024   Colonoscopy  02/06/2025   DTaP/Tdap/Td (3 - Td or Tdap) 03/03/2025   Pneumonia Vaccine 55+ Years old  Completed   INFLUENZA VACCINE  Completed   DEXA SCAN  Completed   Hepatitis C Screening  Completed   Zoster Vaccines- Shingrix  Completed   HPV VACCINES  Aged Out    Health Maintenance  Health Maintenance Due  Topic Date Due    COVID-19 Vaccine (7 - 2024-25 season) 11/01/2022    Colorectal cancer screening: Type of screening: Colonoscopy. Completed 02/07/20. Repeat every 5 years  Mammogram status: Completed 03/16/23. Repeat every year  Bone Density status: Completed  04/28/22. Results reflect: Bone density results: OSTEOPOROSIS. Repeat every   years.      Additional Screening:  Hepatitis C Screening: does qualify; Completed 05/27/15  Vision Screening: Recommended annual ophthalmology exams for early detection of glaucoma and other disorders of the eye. Is the patient up to date with their annual eye exam?  Yes  Who is the provider or what is the name of the office in which the patient attends annual eye exams? Dr Candi Chafe If pt is not established with a provider, would they like to be referred to a provider to establish care? No .   Dental Screening: Recommended annual dental exams for proper oral hygiene    Community Resource Referral / Chronic Care Management:  CRR required this visit?  No   CCM required this visit?  No     Plan:     I have personally reviewed and noted the following in the patient's chart:   Medical and social history Use of alcohol, tobacco or illicit drugs  Current medications and supplements including opioid prescriptions. Patient is not currently taking opioid prescriptions. Functional ability and status Nutritional status Physical activity Advanced directives List of other physicians Hospitalizations, surgeries, and ER visits in previous 12 months Vitals Screenings to include cognitive, depression, and falls Referrals and appointments  In addition, I have reviewed and discussed with patient certain preventive protocols, quality metrics, and best practice recommendations. A written personalized care plan for preventive services as well as general preventive health recommendations were provided to patient.     Dewayne Ford, LPN   06/08/8117   After Visit Summary:  (MyChart) Due to this being a telephonic visit, the after visit summary with patients personalized plan was offered to patient via MyChart   Nurse Notes: None

## 2023-03-17 NOTE — Patient Instructions (Addendum)
 Brittany [name] , Thank you for taking time to come for your Medicare Wellness Visit. I appreciate your ongoing commitment to your health goals. Please review the following plan we discussed and let me know if I can assist you in the future.   Referrals/Orders/Follow-Ups/Clinician Recommendations:   This is a list of the screening recommended for you and due dates:  Health Maintenance  Topic Date Due   COVID-19 Vaccine (7 - 2024-25 season) 11/01/2022   Mammogram  03/15/2024   Medicare Annual Wellness Visit  03/16/2024   Colon Cancer Screening  02/06/2025   DTaP/Tdap/Td vaccine (3 - Td or Tdap) 03/03/2025   Pneumonia Vaccine  Completed   Flu Shot  Completed   DEXA scan (bone density measurement)  Completed   Hepatitis C Screening  Completed   Zoster (Shingles) Vaccine  Completed   HPV Vaccine  Aged Out    Advanced directives: (Copy Requested) Please bring a copy of your health care power of attorney and living will to the office to be added to your chart at your convenience.  Next Medicare Annual Wellness Visit scheduled for next year: Yes

## 2023-03-19 IMAGING — DX DG KNEE 1-2V PORT*L*
1 series · 2 of 2 positions shown · non-contrast
Comparison: Preoperative radiograph 06/11/2020

CLINICAL DATA: Postop.

EXAM:
PORTABLE LEFT KNEE - 1-2 VIEW

[Series 1: knee · 0.14mm/px · 2 of 2 slices shown]
[im 1/2]
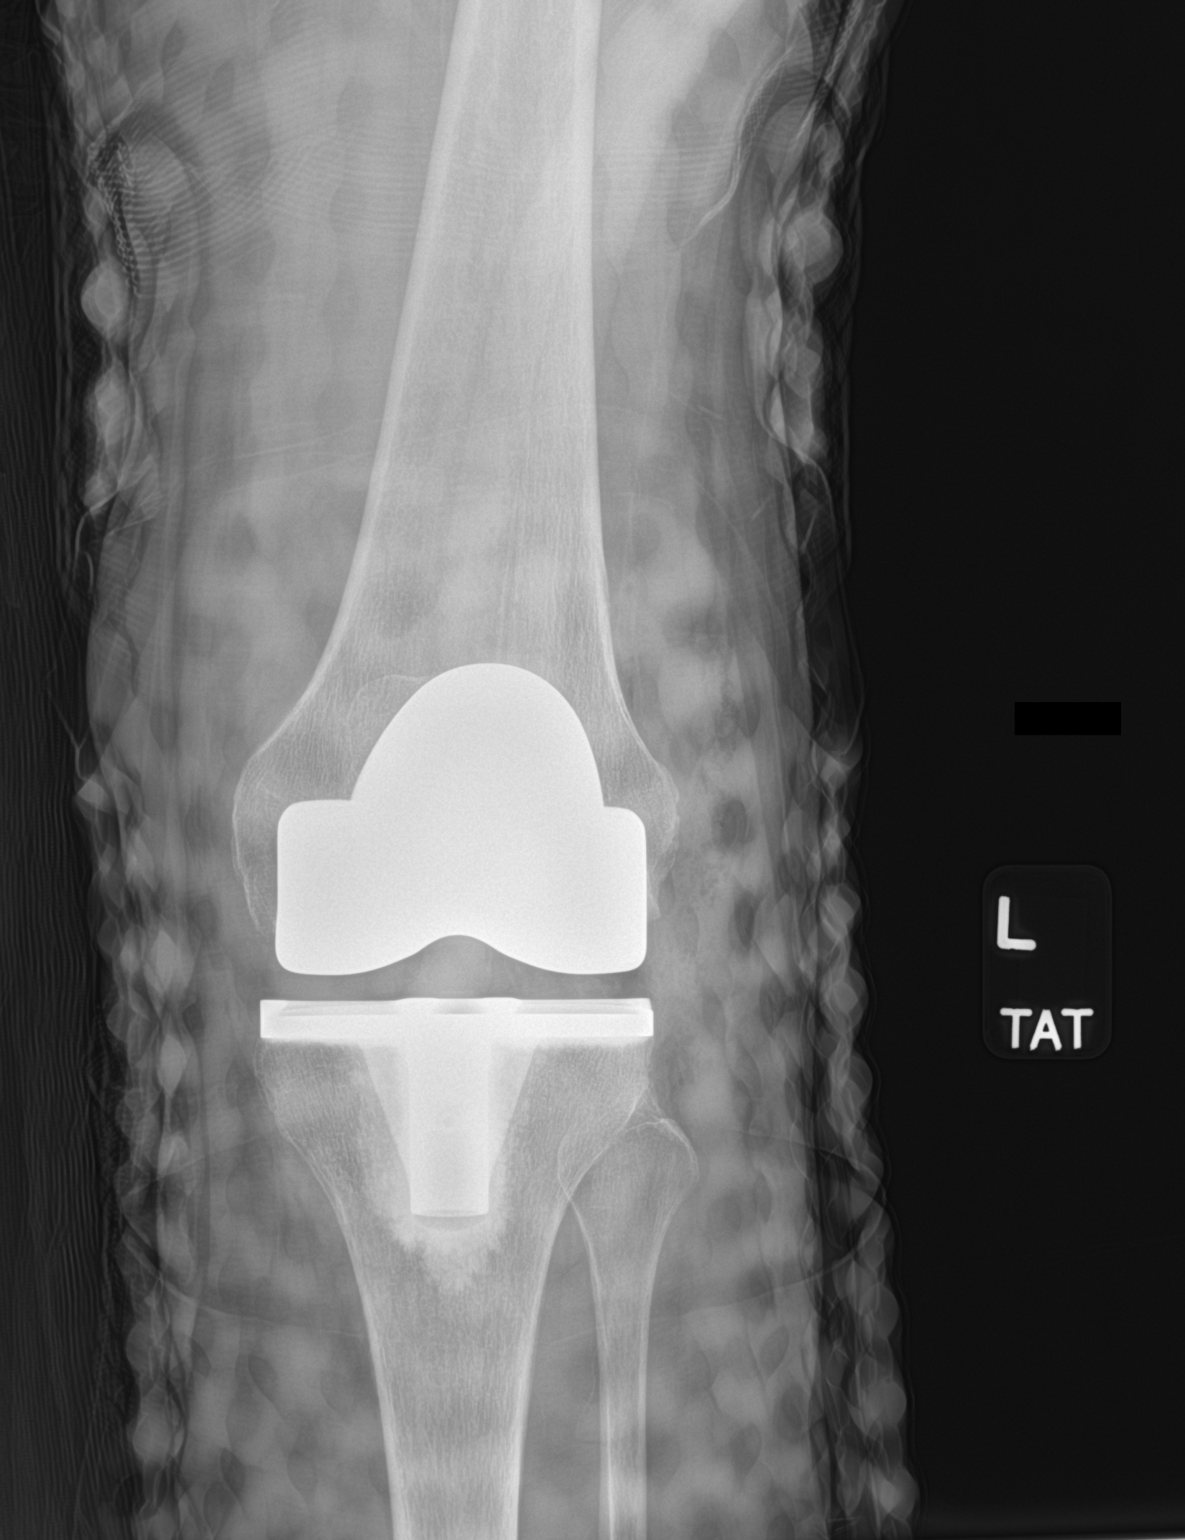
[im 2/2]
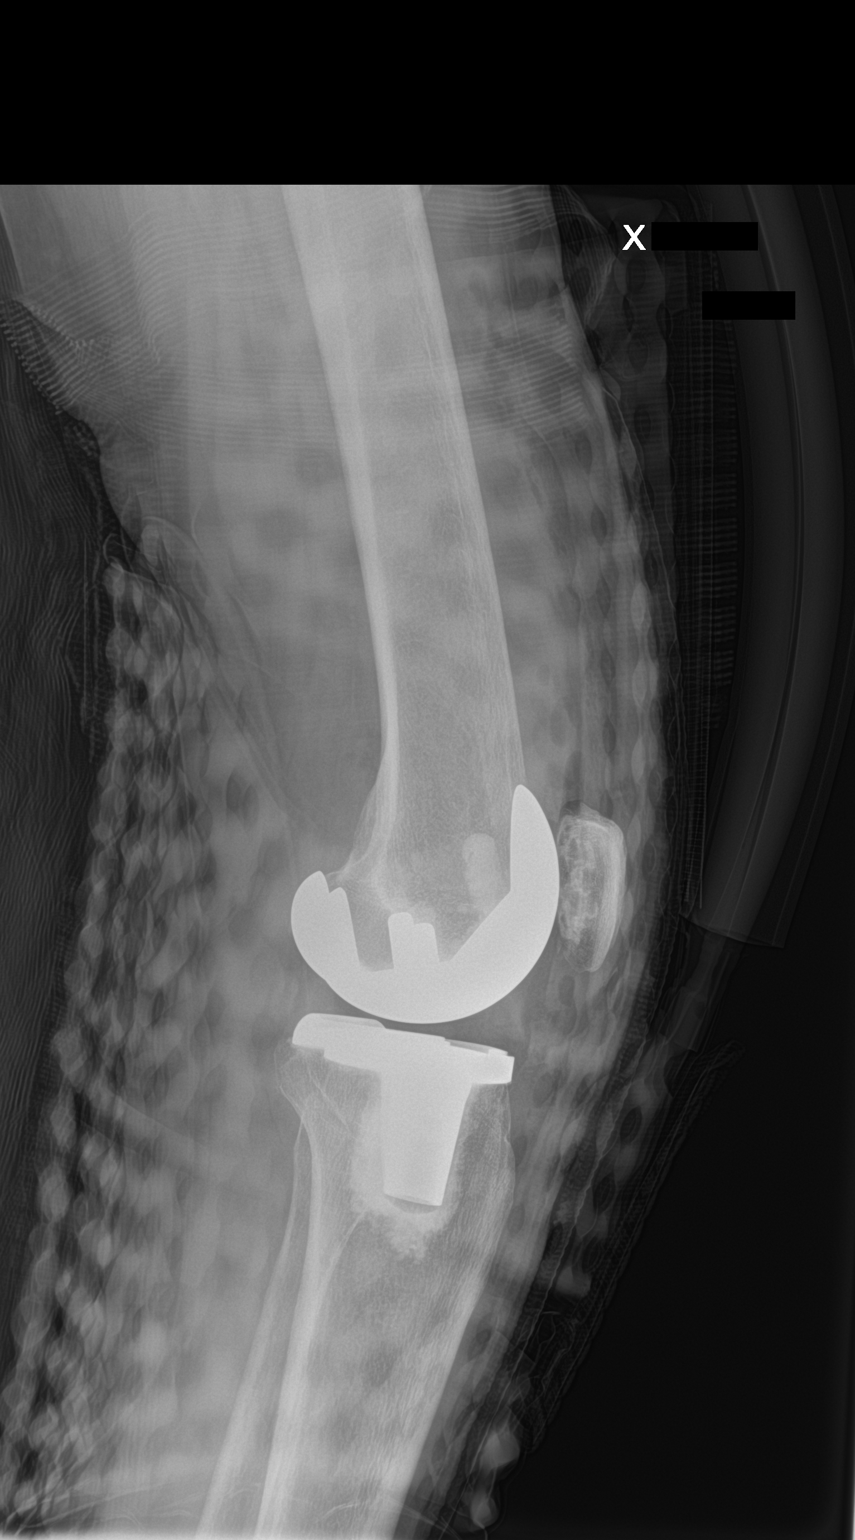

[2 of 2 positions shown; findings below may reference images not displayed]

FINDINGS: Left knee arthroplasty in expected alignment. No periprosthetic
lucency or fracture. There has been patellar resurfacing. Recent
postsurgical change includes air and edema in the soft tissues and
joint space. Overlying brace in place causing external artifact.
IMPRESSION: Left knee arthroplasty without immediate postoperative complication.

## 2023-03-25 ENCOUNTER — Encounter: Payer: Self-pay | Admitting: Internal Medicine

## 2023-03-25 ENCOUNTER — Ambulatory Visit: Payer: PPO | Admitting: Internal Medicine

## 2023-03-25 VITALS — BP 104/64 | HR 60 | Ht 67.0 in | Wt 132.0 lb

## 2023-03-25 DIAGNOSIS — Z860100 Personal history of colon polyps, unspecified: Secondary | ICD-10-CM

## 2023-03-25 DIAGNOSIS — Z800 Family history of malignant neoplasm of digestive organs: Secondary | ICD-10-CM

## 2023-03-25 DIAGNOSIS — K5909 Other constipation: Secondary | ICD-10-CM

## 2023-03-25 DIAGNOSIS — R194 Change in bowel habit: Secondary | ICD-10-CM

## 2023-03-25 DIAGNOSIS — K5900 Constipation, unspecified: Secondary | ICD-10-CM | POA: Diagnosis not present

## 2023-03-25 MED ORDER — NA SULFATE-K SULFATE-MG SULF 17.5-3.13-1.6 GM/177ML PO SOLN: 1.0000 | Freq: Once | ORAL | 0 refills | Status: AC

## 2023-03-25 NOTE — Progress Notes (Addendum)
Brittany [name] 77 y.o. [birth year] [id number]  Assessment & Plan:   Encounter Diagnoses  Name Primary?   Change in bowel habits Yes   Constipation    Hx of colonic polyps    Family history of colon cancer - sister at 73    Given these changes in bowel habits and the family history of prior polyp history I think it is best to perform a colonoscopy.  I do have a suspicion that this is related to her diverticulosis which is severe but I think best to exclude a more serious problem such as colorectal neoplasia/malignancy.  Treatment of constipation: Miralax 2 doses every day Dulcolax 2 every other day  The risks and benefits as well as alternatives of endoscopic procedure(s) have been discussed and reviewed. All questions answered. The patient agrees to proceed.     Subjective:   Chief Complaint: Change in bowel habits with constipation  HPI 78 year old white woman known to me with a history of colon polyps and family history of colon cancer (sessile serrated polyp less than 1 cm), remote history of breast cancer, paroxysmal atrial fibrillation and obstructive sleep apnea status post A-fib ablation 2020.  She is here presenting with complaints of constipation that developed about 8 months ago.  Moving her bowels about every 2 days.  MiraLAX and laxatives have been used.  No bleeding reported.  She has tried using MiraLAX daily for weeks, she has taken Dulcolax up to 3 at a time and it will produce a bowel movement but it is incomplete.  In general she has incomplete defecation.  She is also tried Metamucil for weeks.  She gets bloated at times.  She would like a better quality of life.  She has expressed interest in a prescription medication treatment to her primary care provider and was referred back for further evaluation.  There is no abdominal pain.  Wt Readings from Last 3 Encounters:  03/25/23 132 lb (59.9 kg)  03/17/23 129 lb (58.5 kg)  12/07/22 129 lb 1.6 oz (58.6 kg)   Lab  Results  Component Value Date   TSH 1.50 11/03/2022     Chemistry      Component Value Date/Time   NA 136 11/03/2022 1121   NA 138 11/06/2021 0946   NA 136 02/16/2017 1315   K 4.3 11/03/2022 1121   K 4.1 02/16/2017 1315   CL 100 11/03/2022 1121   CL 98 03/21/2012 1053   CO2 29 11/03/2022 1121   CO2 26 02/16/2017 1315   BUN 12 11/03/2022 1121   BUN 8 11/06/2021 0946   BUN 10.4 02/16/2017 1315   CREATININE 0.63 11/03/2022 1121   CREATININE 0.65 12/05/2021 1120   CREATININE 0.7 02/16/2017 1315      Component Value Date/Time   CALCIUM 10.1 11/03/2022 1121   CALCIUM 9.2 02/16/2017 1315   ALKPHOS 59 11/03/2022 1121   ALKPHOS 56 02/16/2017 1315   AST 18 11/03/2022 1121   AST 15 12/05/2021 1120   AST 17 02/16/2017 1315   ALT 10 11/03/2022 1121   ALT 10 12/05/2021 1120   ALT 7 02/16/2017 1315   BILITOT 0.5 11/03/2022 1121   BILITOT 0.4 12/05/2021 1120   BILITOT 0.34 02/16/2017 1315     Lab Results  Component Value Date   WBC 5.7 11/03/2022   HGB 13.3 11/03/2022   HCT 40.6 11/03/2022   MCV 95.7 11/03/2022   PLT 304.0 11/03/2022     Colonoscopy 02/07/2020 - Severe diverticulosis in the  sigmoid colon. There was narrowing of the colon in association with the diverticular opening. - Melanosis in the colon. - The examination was otherwise normal on direct and retroflexion views. - No specimens collected.   Allergies  Allergen Reactions   Boniva [Ibandronic Acid] Nausea Only   Hydrocodone-Acetaminophen Other (See Comments)    "Does not relieve pain", patient reported.  Can take Tylenol.   Lipitor [Atorvastatin] Other (See Comments)    Muscle and joint pain(s)   Pt request removal of this intolerance   Tramadol Other (See Comments)    "Does not help pain."- patient reported.   Oxycodone    Current Meds  Medication Sig   acetaminophen (TYLENOL) 500 MG tablet Take 500 mg by mouth every 6 (six) hours as needed.   Biotin 5000 MCG TABS Take 5,000 mcg by mouth daily.    Calcium-Magnesium-Zinc (CAL-MAG-ZINC PO) Take 1 tablet by mouth daily. 600 mg   Cholecalciferol (VITAMIN D) 50 MCG (2000 UT) tablet Take 2,000 Units by mouth daily.   citalopram (CELEXA) 40 MG tablet TAKE 1 TABLET BY MOUTH EVERY DAY   Cyanocobalamin (VITAMIN B-12 PO) Take 2,500 mcg by mouth daily.   denosumab (PROLIA) 60 MG/ML SOSY injection Inject 60 mg into the skin every 6 (six) months.   diphenhydrAMINE (BENADRYL) 25 MG tablet Take 25 mg by mouth every 6 (six) hours as needed for allergies.   Evolocumab (REPATHA SURECLICK) 140 MG/ML SOAJ Inject 140 mg into the skin every 14 (fourteen) days.   famotidine (PEPCID) 40 MG tablet TAKE 1 TABLET BY MOUTH EVERY DAY   fluticasone (FLONASE) 50 MCG/ACT nasal spray SPRAY 2 SPRAYS INTO EACH NOSTRIL EVERY DAY   metoprolol tartrate (LOPRESSOR) 25 MG tablet Take 1 tablet (25 mg total) by mouth every 6 (six) hours as needed. For A-Fib   Omega-3 Fatty Acids (FISH OIL) 1200 MG CAPS Take 1,200 mg by mouth daily.   Probiotic Product (PROBIOTIC DAILY PO) Take 1 tablet by mouth daily.   SUMAtriptan (IMITREX) 100 MG tablet Take 1 tablet (100 mg total) by mouth once for 1 dose. Can repeat dose in 2 hours if symptoms persist. Max dose 2 tablets in 24 hours.   SUMAtriptan (IMITREX) 100 MG tablet TAKE 1 TABLET (100 MG TOTAL) BY MOUTH EVERY 2 (TWO) HOURS AS NEEDED FOR MIGRAINE. MAY REPEAT IN 2 HOURS IF HEADACHE PERSISTS OR RECURS.   zolpidem (AMBIEN) 5 MG tablet TAKE 0.5-1 TABLETS (2.5-5 MG TOTAL) BY MOUTH AT BEDTIME AS NEEDED FOR SLEEP.   Past Medical History:  Diagnosis Date   A-fib (HCC)    Acid reflux    Acute cystitis with positive culture 03/06/2014   Anemia    Anxiety    Arthritis    Baker cyst, left 04/06/2016   Aspirated 04/06/2016   Breast cancer (HCC) 11/23/2007   right, ER/PR -, HER 2 -   Cognitive deficits 09/25/2014   2016 Very mild, could be related to meds    Degenerative arthritis of left knee 04/06/2016   Injection and aspiration 04/06/2016    Degenerative arthritis of right knee 09/27/2015   Depression    Esophageal stricture    Gastritis 10/2007   History of chemotherapy    neoadjuvant chemo, last dose 03/2008   History of radiation therapy 07/25/08 -09/11/08   right breast   Hx of colonic polyps 08/11/2016   Hyperlipidemia    Migraines    rare   Myalgia 07/18/2013   5/15 multiple site    OSA (obstructive sleep  apnea)    severe OSA AHI 34, intolerant to PAP and now s/p hypoglossal nerve stimulator implant   Osteopenia    Rupture of quadriceps tendon 03/23/2017   Past Surgical History:  Procedure Laterality Date   ATRIAL FIBRILLATION ABLATION N/A 10/07/2018   Procedure: ATRIAL FIBRILLATION ABLATION;  Surgeon: Regan Lemming, MD;  Location: [facility];  Service: Cardiovascular;  Laterality: N/A;   BREAST LUMPECTOMY  06/26/2008   right, CA   XRT, chemo   COLONOSCOPY     DRUG INDUCED ENDOSCOPY N/A 02/21/2020   Procedure: DRUG INDUCED ENDOSCOPY;  Surgeon: Christia Reading, MD;  Location: [facility];  Service: ENT;  Laterality: N/A;   EYE SURGERY Bilateral    growth removed per right thigh     as teen - mole   IMPLANTATION OF HYPOGLOSSAL NERVE STIMULATOR N/A 05/15/2020   Procedure: IMPLANTATION OF HYPOGLOSSAL NERVE STIMULATOR;  Surgeon: Christia Reading, MD;  Location: [facility];  Service: ENT;  Laterality: N/A;   JOINT REPLACEMENT  12/31/2015   RTK   QUADRICEPS TENDON REPAIR Right 09/14/2016   Procedure: OPEN REPAIR QUADRICEP TENDON;  Surgeon: Durene Romans, MD;  Location: [facility];  Service: Orthopedics;  Laterality: Right;   TOTAL KNEE ARTHROPLASTY Right 12/31/2015   Procedure: RIGHT TOTAL KNEE ARTHROPLASTY;  Surgeon: Durene Romans, MD;  Location: [facility];  Service: Orthopedics;  Laterality: Right;   TOTAL KNEE ARTHROPLASTY Left 10/11/2020   Procedure: TOTAL KNEE ARTHROPLASTY;  Surgeon: Tarry Kos, MD;  Location: [facility] OR;  Service: Orthopedics;  Laterality: Left;   UPPER GASTROINTESTINAL ENDOSCOPY      Social History   Social History Narrative   G3, P3, 1st pregnancy age 70, menopause age 40, no HRT   family history includes Breast cancer (age of onset: 75) in her sister; Cancer in her father; Colon cancer (age of onset: 61) in her sister; Dementia in her mother; Epilepsy in her mother; Heart disease in her father; Hip fracture in her mother; Other in her brother and maternal grandfather.   Review of Systems As above otherwise negative.  Objective:   Physical Exam @BP  104/64   Pulse 60   Ht 5\' 7"  (1.702 m)   Wt 132 lb (59.9 kg)   LMP 03/02/1994 (Approximate)   BMI 20.67 kg/m @  General:  NAD Eyes:   anicteric Lungs:  clear Heart::  S1S2 no rubs, murmurs or gallops Abdomen:  soft and nontender, BS+ Ext:   no edema, cyanosis or clubbing    Data Reviewed:  As per HPI  Most recent oncology and cardiology notes reviewed.

## 2023-03-25 NOTE — Patient Instructions (Signed)
You have been scheduled for a colonoscopy. Please follow written instructions given to you at your visit today.   Please pick up your prep supplies at the pharmacy within the next 1-3 days.  If you use inhalers (even only as needed), please bring them with you on the day of your procedure.  DO NOT TAKE 7 DAYS PRIOR TO TEST- Trulicity (dulaglutide) Ozempic, Wegovy (semaglutide) Mounjaro (tirzepatide) Bydureon Bcise (exanatide extended release)  DO NOT TAKE 1 DAY PRIOR TO YOUR TEST Rybelsus (semaglutide) Adlyxin (lixisenatide) Victoza (liraglutide) Byetta (exanatide) ___________________________________________________________________________  Per Dr Leone Payor take 2 doses of Miralax daily. May be taken together or split them up.   Take 2 over the counter Dulcolax every other day.  _______________________________________________________  If your blood pressure at your visit was 140/90 or greater, please contact your primary care physician to follow up on this.  _______________________________________________________  If you are age 87 or older, your body mass index should be between 23-30. Your Body mass index is 20.67 kg/m. If this is out of the aforementioned range listed, please consider follow up with your Primary Care Provider.  If you are age 66 or younger, your body mass index should be between 19-25. Your Body mass index is 20.67 kg/m. If this is out of the aformentioned range listed, please consider follow up with your Primary Care Provider.   ________________________________________________________  The [facility] providers would like to encourage you to use [facility] to communicate with providers for non-urgent requests or questions.  Due to long hold times on the telephone, sending your provider a message by [facility] may be a faster and more efficient way to get a response.  Please allow 48 business hours for a response.  Please remember that this is for non-urgent requests.   _______________________________________________________  I appreciate the opportunity to care for you. Stan Head, MD, [facility]

## 2023-03-31 ENCOUNTER — Other Ambulatory Visit: Payer: Self-pay | Admitting: Family Medicine

## 2023-04-06 ENCOUNTER — Encounter: Payer: Self-pay | Admitting: Internal Medicine

## 2023-04-07 ENCOUNTER — Other Ambulatory Visit: Payer: Self-pay | Admitting: Family Medicine

## 2023-04-07 DIAGNOSIS — K219 Gastro-esophageal reflux disease without esophagitis: Secondary | ICD-10-CM

## 2023-04-07 DIAGNOSIS — F411 Generalized anxiety disorder: Secondary | ICD-10-CM

## 2023-04-14 ENCOUNTER — Ambulatory Visit: Payer: PPO | Admitting: Internal Medicine

## 2023-04-14 ENCOUNTER — Encounter: Payer: Self-pay | Admitting: Internal Medicine

## 2023-04-14 VITALS — BP 117/52 | HR 63 | Temp 97.4°F | Resp 10 | Ht 67.0 in | Wt 132.0 lb

## 2023-04-14 DIAGNOSIS — D124 Benign neoplasm of descending colon: Secondary | ICD-10-CM | POA: Diagnosis not present

## 2023-04-14 DIAGNOSIS — D122 Benign neoplasm of ascending colon: Secondary | ICD-10-CM | POA: Diagnosis not present

## 2023-04-14 DIAGNOSIS — K635 Polyp of colon: Secondary | ICD-10-CM | POA: Diagnosis not present

## 2023-04-14 DIAGNOSIS — K5730 Diverticulosis of large intestine without perforation or abscess without bleeding: Secondary | ICD-10-CM

## 2023-04-14 DIAGNOSIS — R194 Change in bowel habit: Secondary | ICD-10-CM

## 2023-04-14 DIAGNOSIS — K648 Other hemorrhoids: Secondary | ICD-10-CM

## 2023-04-14 DIAGNOSIS — K644 Residual hemorrhoidal skin tags: Secondary | ICD-10-CM

## 2023-04-14 DIAGNOSIS — K5900 Constipation, unspecified: Secondary | ICD-10-CM | POA: Diagnosis not present

## 2023-04-14 DIAGNOSIS — K6389 Other specified diseases of intestine: Secondary | ICD-10-CM | POA: Diagnosis not present

## 2023-04-14 MED ORDER — SODIUM CHLORIDE 0.9 % IV SOLN: 500.0000 mL | INTRAVENOUS | Status: DC

## 2023-04-14 MED ORDER — LUBIPROSTONE 24 MCG PO CAPS: 24.0000 ug | ORAL_CAPSULE | Freq: Two times a day (BID) | ORAL | 5 refills | Status: DC

## 2023-04-14 NOTE — Progress Notes (Signed)
Pt's states no medical or surgical changes since previsit or office visit.

## 2023-04-14 NOTE — Op Note (Signed)
[facility] Patient Name: Brittany [name] Procedure Date: 04/14/2023 1:17 PM MRN: [id number] Endoscopist: Iva Boop , MD, [id number] Age: 78 Referring MD:  Date of Birth: May 17, 1945 Gender: Female Account #: [id number] Procedure:                Colonoscopy Indications:              Change in bowel habits, Constipation Medicines:                Monitored Anesthesia Care Procedure:                Pre-Anesthesia Assessment:                           - Prior to the procedure, a History and Physical                            was performed, and patient medications and                            allergies were reviewed. The patient's tolerance of                            previous anesthesia was also reviewed. The risks                            and benefits of the procedure and the sedation                            options and risks were discussed with the patient.                            All questions were answered, and informed consent                            was obtained. Prior Anticoagulants: The patient has                            taken no anticoagulant or antiplatelet agents. ASA                            Grade Assessment: II - A patient with mild systemic                            disease. After reviewing the risks and benefits,                            the patient was deemed in satisfactory condition to                            undergo the procedure.                           After obtaining informed consent, the colonoscope  was passed under direct vision. Throughout the                            procedure, the patient's blood pressure, pulse, and                            oxygen saturations were monitored continuously. The                            Olympus Scope [id number] was introduced through the                            anus and advanced to the the cecum, identified by                            appendiceal orifice and  ileocecal valve. The                            colonoscopy was performed without difficulty. The                            patient tolerated the procedure well. The quality                            of the bowel preparation was excellent. The                            ileocecal valve, appendiceal orifice, and rectum                            were photographed. The bowel preparation used was                            SUPREP via split dose instruction. Scope In: 1:35:54 PM Scope Out: 1:47:35 PM Scope Withdrawal Time: 0 hours 8 minutes 10 seconds  Total Procedure Duration: 0 hours 11 minutes 41 seconds  Findings:                 The perianal and digital rectal examinations were                            normal.                           Two sessile polyps were found in the descending                            colon and ascending colon. The polyps were                            diminutive in size. These polyps were removed with                            a cold snare. Resection and retrieval were  complete. Verification of patient identification                            for the specimen was done. Estimated blood loss was                            minimal.                           Multiple diverticula were found in the sigmoid                            colon. There was narrowing of the colon in                            association with the diverticular opening.                           External and internal hemorrhoids were found.                           The exam was otherwise without abnormality on                            direct and retroflexion views. Complications:            No immediate complications. Estimated Blood Loss:     Estimated blood loss was minimal. Impression:               - Two diminutive polyps in the descending colon and                            in the ascending colon, removed with a cold snare.                             Resected and retrieved.                           - Severe diverticulosis in the sigmoid colon. There                            was narrowing of the colon in association with the                            diverticular opening.                           - External and internal hemorrhoids.                           - The examination was otherwise normal on direct                            and retroflexion views except for mild melanosis.. Recommendation:           - Patient has a contact number available for  emergencies. The signs and symptoms of potential                            delayed complications were discussed with the                            patient. Return to normal activities tomorrow.                            Written discharge instructions were provided to the                            patient.                           - High fiber diet.                           - Await pathology results.                           - No repeat colonoscopy due to age.                           - Start lubiprostone 24 ug bid (Miralax 2/day and 2                            dulcolax qod did not relieve symptoms) Iva Boop, MD 04/14/2023 1:57:50 PM This report has been signed electronically.

## 2023-04-14 NOTE — Patient Instructions (Addendum)
Two tiny polyps found and removed.  Severe diverticulosis in the lower colon (just above the rectum) as before.  Hemorrhoids seen.  Polyps will be analyzed but I feel certain are benign and inconsequential.  I have prescribed lubiprostone to see if that helps.  I appreciate the opportunity to care for you. Iva Boop, MD, [facility]  Handouts provide for polyps, hemorrhoids, and diverticulosis.    YOU HAD AN ENDOSCOPIC PROCEDURE TODAY AT THE [facility]:   Refer to the procedure report that was given to you for any specific questions about what was found during the examination.  If the procedure report does not answer your questions, please call your gastroenterologist to clarify.  If you requested that your care partner not be given the details of your procedure findings, then the procedure report has been included in a sealed envelope for you to review at your convenience later.  YOU SHOULD EXPECT: Some feelings of bloating in the abdomen. Passage of more gas than usual.  Walking can help get rid of the air that was put into your GI tract during the procedure and reduce the bloating. If you had a lower endoscopy (such as a colonoscopy or flexible sigmoidoscopy) you may notice spotting of blood in your stool or on the toilet paper. If you underwent a bowel prep for your procedure, you may not have a normal bowel movement for a few days.  Please Note:  You might notice some irritation and congestion in your nose or some drainage.  This is from the oxygen used during your procedure.  There is no need for concern and it should clear up in a day or so.  SYMPTOMS TO REPORT IMMEDIATELY:  Following lower endoscopy (colonoscopy or flexible sigmoidoscopy):  Excessive amounts of blood in the stool  Significant tenderness or worsening of abdominal pains  Swelling of the abdomen that is new, acute  Fever of 100F or higher  For urgent or emergent issues, a gastroenterologist can be  reached at any hour by calling [phone]. Do not use MyChart messaging for urgent concerns.    DIET:  We do recommend a small meal at first, but then you may proceed to your regular diet.  Drink plenty of fluids but you should avoid alcoholic beverages for 24 hours.  ACTIVITY:  You should plan to take it easy for the rest of today and you should NOT DRIVE or use heavy machinery until tomorrow (because of the sedation medicines used during the test).    FOLLOW UP: Our staff will call the number listed on your records the next business day following your procedure.  We will call around 7:15- 8:00 am to check on you and address any questions or concerns that you may have regarding the information given to you following your procedure. If we do not reach you, we will leave a message.     If any biopsies were taken you will be contacted by phone or by letter within the next 1-3 weeks.  Please call us at [phone] if you have not heard about the biopsies in 3 weeks.    SIGNATURES/CONFIDENTIALITY: You and/or your care partner have signed paperwork which will be entered into your electronic medical record.  These signatures attest to the fact that that the information above on your After Visit Summary has been reviewed and is understood.  Full responsibility of the confidentiality of this discharge information lies with you and/or your care-partner.

## 2023-04-14 NOTE — Progress Notes (Signed)
Called to room to assist during endoscopic procedure.  Patient ID and intended procedure confirmed with present staff. Received instructions for my participation in the procedure from the performing physician.

## 2023-04-14 NOTE — Progress Notes (Signed)
Report to PACU, RN, vss, BBS= Clear.

## 2023-04-14 NOTE — Progress Notes (Signed)
History and Physical Interval Note:  04/14/2023 1:25 PM  Brittany [name]  has presented today for endoscopic procedure(s), with the diagnosis of  Encounter Diagnosis  Name Primary?   Change in bowel habits Yes  .  The various methods of evaluation and treatment have been discussed with the patient and/or family. After consideration of risks, benefits and other options for treatment, the patient has consented to  the endoscopic procedure(s).   The patient's history has been reviewed, patient examined, no change in status, stable for endoscopic procedure(s).  I have reviewed the patient's chart and labs.  Questions were answered to the patient's satisfaction.     Iva Boop, MD, [facility]

## 2023-04-14 NOTE — Progress Notes (Signed)
History and Physical Interval Note:  04/14/2023 1:28 PM  Brittany [name]  has presented today for endoscopic procedure(s), with the diagnosis of  Encounter Diagnosis  Name Primary?   Change in bowel habits Yes  .  The various methods of evaluation and treatment have been discussed with the patient and/or family. After consideration of risks, benefits and other options for treatment, the patient has consented to  the endoscopic procedure(s).   The patient's history has been reviewed, patient examined, no change in status, stable for endoscopic procedure(s).  I have reviewed the patient's chart and labs.  Questions were answered to the patient's satisfaction.     Iva Boop, MD, [facility]

## 2023-04-15 ENCOUNTER — Telehealth: Payer: Self-pay

## 2023-04-15 DIAGNOSIS — D219 Benign neoplasm of connective and other soft tissue, unspecified: Secondary | ICD-10-CM | POA: Diagnosis not present

## 2023-04-15 DIAGNOSIS — D485 Neoplasm of uncertain behavior of skin: Secondary | ICD-10-CM | POA: Diagnosis not present

## 2023-04-15 DIAGNOSIS — Z85828 Personal history of other malignant neoplasm of skin: Secondary | ICD-10-CM | POA: Diagnosis not present

## 2023-04-15 NOTE — Telephone Encounter (Signed)
  Follow up Call-     04/14/2023   12:52 PM  Call back number  Post procedure Call Back phone  # [phone]  Permission to leave phone message Yes     Patient questions:  Do you have a fever, pain , or abdominal swelling? No. Pain Score  0 *  Have you tolerated food without any problems? Yes.    Have you been able to return to your normal activities? Yes.    Do you have any questions about your discharge instructions: Diet   No. Medications  No. Follow up visit  No.  Do you have questions or concerns about your Care? No.  Actions: * If pain score is 4 or above: No action needed, pain <4.

## 2023-04-19 ENCOUNTER — Encounter: Payer: Self-pay | Admitting: Internal Medicine

## 2023-04-19 LAB — SURGICAL PATHOLOGY

## 2023-04-20 ENCOUNTER — Encounter: Payer: Self-pay | Admitting: Internal Medicine

## 2023-04-27 ENCOUNTER — Telehealth: Payer: Self-pay | Admitting: Internal Medicine

## 2023-04-27 ENCOUNTER — Encounter: Payer: Self-pay | Admitting: Internal Medicine

## 2023-04-27 DIAGNOSIS — K5909 Other constipation: Secondary | ICD-10-CM

## 2023-04-27 NOTE — Telephone Encounter (Signed)
 Patient is returning a call to steven and stated that if he could please return her call tomorrow before 10 AM. Please advise.

## 2023-04-28 NOTE — Telephone Encounter (Addendum)
 Pt stated that she is still having issues with constipation. Pt stated that she has been taking the Lubiprostone as prescribed since her Colonoscopy. Pt stated that she does not feel that things have changed for her. Pt last BM was yesterday. Pt stated that she typically has 1 BM a day which is small. Pt was recommended to try Miralax along with the Medication regiment that was prescribed to her previously. Pt was asked to call back Friday afternoon after starting  the Miralax with a symptom update.  Please review and advise if other recommendations. Pt notified that Dr. Leone Payor is out of the office and will return next week,

## 2023-04-28 NOTE — Telephone Encounter (Signed)
 Returning call. Will be leaving home at 1030am. Please call back after 2 if not contacted before then. Please advise.

## 2023-04-30 NOTE — Telephone Encounter (Signed)
 Inbound all from patient requesting to speak with Brittany [name], patient states she will unavailable from 11:30- 2:00. Please advise.

## 2023-04-30 NOTE — Telephone Encounter (Signed)
 Pt called for symptom update as  requested earlier in the week after recommendations were given.  Pt stated that she started taking the Miralax along with the Amitiza and she is still having constipation. Please see notes below and review and advise.  Pt was notified that Dr. Leone Payor is out of the office today and will return next week.

## 2023-05-02 NOTE — Telephone Encounter (Signed)
 I have pended a prescription for Linzess 145 mcg daily.  We can send that in once you talk to her and try that instead of the Amitiza and MiraLAX (would discontinue these from her list).  Ask her to give [place] an update in a couple of weeks.  Explained that the Linzess is taken in the morning before she eats and sometimes produces a large loose or liquid bowel movement.  When she starts that she should not have any morning appointments or activities to do so she can see how it works on her.  If the medications fail to work I am contemplating referral to a surgeon to consider removal of the area of colon with the severe diverticulosis.

## 2023-05-03 MED ORDER — LINACLOTIDE 145 MCG PO CAPS
145.0000 ug | ORAL_CAPSULE | Freq: Every day | ORAL | 2 refills | Status: DC
Start: 2023-05-03 — End: 2023-12-09

## 2023-05-03 NOTE — Telephone Encounter (Signed)
 Pt made aware of Dr. Leone Payor recommendations: Prescription sent to pt pharmacy.  Pt made aware. Pt verbalized understanding with all questions answered.

## 2023-05-04 NOTE — Telephone Encounter (Signed)
 Left message for pt to call back

## 2023-05-04 NOTE — Telephone Encounter (Signed)
 Patient called requesting to speak with you regarding Linzess.

## 2023-05-04 NOTE — Telephone Encounter (Signed)
 Patient is returning your call for message below. Thank you.

## 2023-05-05 NOTE — Telephone Encounter (Signed)
 Pt stated that the medication Linzess was working great for her. Pt asked about how long the diarrhea would last. Pt was notified that this is a typical side effect that usually last about 2 weeks. Pt verbalized understanding with all questions answered.

## 2023-05-17 ENCOUNTER — Encounter: Payer: Self-pay | Admitting: Cardiology

## 2023-05-17 ENCOUNTER — Ambulatory Visit: Payer: PPO | Attending: Cardiology | Admitting: Cardiology

## 2023-05-17 VITALS — BP 116/60 | HR 78 | Resp 16 | Ht 67.0 in | Wt 129.4 lb

## 2023-05-17 DIAGNOSIS — G4733 Obstructive sleep apnea (adult) (pediatric): Secondary | ICD-10-CM | POA: Diagnosis not present

## 2023-05-17 NOTE — Patient Instructions (Signed)
 Medication Instructions:  Your physician recommends that you continue on your current medications as directed. Please refer to the Current Medication list given to you today.  *If you need a refill on your cardiac medications before your next appointment, please call your pharmacy*  Follow-Up: At [facility], you and your health needs are our priority.  As part of our continuing mission to provide you with exceptional heart care, we have created designated Provider Care Teams.  These Care Teams include your primary Cardiologist (physician) and Advanced Practice Providers (APPs -  Physician Assistants and Nurse Practitioners) who all work together to provide you with the care you need, when you need it.  Your next appointment:   6 months  Provider:   Armanda Magic, MD    1st Floor: - Lobby - Registration  - Pharmacy  - Lab - Cafe  2nd Floor: - PV Lab - Diagnostic Testing (echo, CT, nuclear med)  3rd Floor: - Vacant  4th Floor: - TCTS (cardiothoracic surgery) - AFib Clinic - Structural Heart Clinic - Vascular Surgery  - Vascular Ultrasound  5th Floor: - HeartCare Cardiology (general and EP) - Clinical Pharmacy for coumadin, hypertension, lipid, weight-loss medications, and med management appointments    Valet parking services will be available as well.

## 2023-05-17 NOTE — Progress Notes (Signed)
 Date:  05/17/2023   ID:  Brittany [name], DOB 1945-09-17, MRN [id number] The patient was identified using 2 identifiers.  PCP:  Karie Georges, MD   [facility] Providers Cardiologist:  Donato Schultz, MD Electrophysiologist:  Will Jorja Loa, MD  Sleep Medicine:  Armanda Magic, MD     Evaluation Performed:  Follow-Up Visit  Chief Complaint:  OSA  History of Present Illness:    Brittany [name] is a 78 y.o. female with who is followed by Dr. Anne Fu for atrial fibrillation who was referred for sleep study.  She underwent home sleep study which showed severe obstructive sleep apnea with an AHI of 34.4/h and moderate oxygen desaturations to 81%.  She underwent CPAP titration to 12 cm H2O. she was intolerant to CPAP.  She was referred her to ENT for evaluation for the hypoglossal nerve stimulator.  She underwent Inspire device implantation 05/15/2020. She underwent in lab titration 01/09/2021 with a therapeutic amplitude of 1.4V with an AHI of 7.5/hr and O2 sats as low as 88%.  Her device was programmed to amplitude of 1.3V.  Repeat sleep study 01/16/2022 on 1.5V (level 3) showed an AHI of 1.3/hr.    She is back for follow up today and doing well.  She is currently on level 3 (1.5 V).  She can use to sleep well at night averaging 7.5 hours nightly.  She feels rested in the morning and has no excessive daytime sleepiness unless she has been reading in the afternoon.  She has not had any tongue discomfort or sore throat.  She does not think she is snoring.    Past Medical History:  Diagnosis Date   A-fib (HCC)    Acid reflux    Acute cystitis with positive culture 03/06/2014   Anemia    Anxiety    Arthritis    Baker cyst, left 04/06/2016   Aspirated 04/06/2016   Breast cancer (HCC) 11/23/2007   right, ER/PR -, HER 2 -   Cognitive deficits 09/25/2014   2016 Very mild, could be related to meds    Degenerative arthritis of left knee 04/06/2016   Injection and aspiration 04/06/2016    Degenerative arthritis of right knee 09/27/2015   Depression    Esophageal stricture    Gastritis 10/2007   History of chemotherapy    neoadjuvant chemo, last dose 03/2008   History of radiation therapy 07/25/08 -09/11/08   right breast   Hx of colonic polyps 08/11/2016   Hyperlipidemia    Migraines    rare   Myalgia 07/18/2013   5/15 multiple site    OSA (obstructive sleep apnea)    severe OSA AHI 34, intolerant to PAP and now s/p hypoglossal nerve stimulator implant   Osteopenia    Rupture of quadriceps tendon 03/23/2017   Past Surgical History:  Procedure Laterality Date   ATRIAL FIBRILLATION ABLATION N/A 10/07/2018   Procedure: ATRIAL FIBRILLATION ABLATION;  Surgeon: Regan Lemming, MD;  Location: [facility];  Service: Cardiovascular;  Laterality: N/A;   BREAST LUMPECTOMY  06/26/2008   right, CA   XRT, chemo   COLONOSCOPY     DRUG INDUCED ENDOSCOPY N/A 02/21/2020   Procedure: DRUG INDUCED ENDOSCOPY;  Surgeon: Christia Reading, MD;  Location: [facility];  Service: ENT;  Laterality: N/A;   EYE SURGERY Bilateral    growth removed per right thigh     as teen - mole   IMPLANTATION OF HYPOGLOSSAL NERVE STIMULATOR N/A 05/15/2020  Procedure: IMPLANTATION OF HYPOGLOSSAL NERVE STIMULATOR;  Surgeon: Christia Reading, MD;  Location: [facility] OR;  Service: ENT;  Laterality: N/A;   JOINT REPLACEMENT  12/31/2015   RTK   QUADRICEPS TENDON REPAIR Right 09/14/2016   Procedure: OPEN REPAIR QUADRICEP TENDON;  Surgeon: Durene Romans, MD;  Location: [facility];  Service: Orthopedics;  Laterality: Right;   TOTAL KNEE ARTHROPLASTY Right 12/31/2015   Procedure: RIGHT TOTAL KNEE ARTHROPLASTY;  Surgeon: Durene Romans, MD;  Location: [facility];  Service: Orthopedics;  Laterality: Right;   TOTAL KNEE ARTHROPLASTY Left 10/11/2020   Procedure: TOTAL KNEE ARTHROPLASTY;  Surgeon: Tarry Kos, MD;  Location: [facility] OR;  Service: Orthopedics;  Laterality: Left;   UPPER GASTROINTESTINAL ENDOSCOPY        Current Meds  Medication Sig   acetaminophen (TYLENOL) 500 MG tablet Take 500 mg by mouth every 6 (six) hours as needed.   Biotin 5000 MCG TABS Take 5,000 mcg by mouth daily.   Calcium-Magnesium-Zinc (CAL-MAG-ZINC PO) Take 1 tablet by mouth daily. 600 mg   Cholecalciferol (VITAMIN D) 50 MCG (2000 UT) tablet Take 2,000 Units by mouth daily.   citalopram (CELEXA) 40 MG tablet TAKE 1 TABLET BY MOUTH EVERY DAY   Cyanocobalamin (VITAMIN B-12 PO) Take 2,500 mcg by mouth daily.   denosumab (PROLIA) 60 MG/ML SOSY injection Inject 60 mg into the skin every 6 (six) months.   diphenhydrAMINE (BENADRYL) 25 MG tablet Take 25 mg by mouth every 6 (six) hours as needed for allergies.   Evolocumab (REPATHA SURECLICK) 140 MG/ML SOAJ Inject 140 mg into the skin every 14 (fourteen) days.   famotidine (PEPCID) 40 MG tablet TAKE 1 TABLET BY MOUTH EVERY DAY   fluticasone (FLONASE) 50 MCG/ACT nasal spray SPRAY 2 SPRAYS INTO EACH NOSTRIL EVERY DAY   linaclotide (LINZESS) 145 MCG CAPS capsule Take 1 capsule (145 mcg total) by mouth daily before breakfast.   metoprolol tartrate (LOPRESSOR) 25 MG tablet Take 1 tablet (25 mg total) by mouth every 6 (six) hours as needed. For A-Fib   Omega-3 Fatty Acids (FISH OIL) 1200 MG CAPS Take 1,200 mg by mouth daily.   Probiotic Product (PROBIOTIC DAILY PO) Take 1 tablet by mouth daily.   SUMAtriptan (IMITREX) 100 MG tablet Take 1 tablet (100 mg total) by mouth once for 1 dose. Can repeat dose in 2 hours if symptoms persist. Max dose 2 tablets in 24 hours.   SUMAtriptan (IMITREX) 100 MG tablet TAKE 1 TABLET (100 MG TOTAL) BY MOUTH EVERY 2 (TWO) HOURS AS NEEDED FOR MIGRAINE. MAY REPEAT IN 2 HOURS IF HEADACHE PERSISTS OR RECURS.   zolpidem (AMBIEN) 5 MG tablet TAKE 0.5-1 TABLETS (2.5-5 MG TOTAL) BY MOUTH AT BEDTIME AS NEEDED FOR SLEEP.     Allergies:   Lipitor [atorvastatin], Boniva [ibandronate], Hydrocodone-acetaminophen, Oxycodone, and Tramadol   Social History   Tobacco Use    Smoking status: Former    Current packs/day: 0.00    Average packs/day: 0.3 packs/day for 5.0 years (1.3 ttl pk-yrs)    Types: Cigarettes    Start date: 03/02/1962    Quit date: 03/03/1967    Years since quitting: 56.2   Smokeless tobacco: Never   Tobacco comments:    6 cig per day when she was smoking/quit years ago  Vaping Use   Vaping status: Never Used  Substance Use Topics   Alcohol use: Yes    Alcohol/week: 7.0 standard drinks of alcohol    Types: 7 Cans of beer per week  Comment: 1 beer per day - 2.4 % alcholol - [employer] 55   Drug use: No     Family Hx: The patient's family history includes Breast cancer (age of onset: 59) in her sister; Cancer in her father; Colon cancer (age of onset: 58) in her sister; Dementia in her mother; Epilepsy in her mother; Heart disease in her father; Hip fracture in her mother; Other in her brother and maternal grandfather. There is no history of Esophageal cancer or Stomach cancer.  ROS:   Please see the history of present illness.    none All other systems reviewed and are negative.   Prior CV studies:   The following studies were reviewed today:  none  Labs/Other Tests and Data Reviewed:    EKG:  No ECG reviewed.  Recent Labs: 11/03/2022: ALT 10; BUN 12; Creatinine, Ser 0.63; Hemoglobin 13.3; Platelets 304.0; Potassium 4.3; Sodium 136; TSH 1.50   Recent Lipid Panel Lab Results  Component Value Date/Time   CHOL 166 10/15/2022 09:53 AM   TRIG 74 10/15/2022 09:53 AM   HDL 62 10/15/2022 09:53 AM   CHOLHDL 2.7 10/15/2022 09:53 AM   CHOLHDL 4 09/25/2020 10:04 AM   LDLCALC 90 10/15/2022 09:53 AM   LDLDIRECT 178.9 03/23/2013 10:59 AM    Wt Readings from Last 3 Encounters:  05/17/23 129 lb 6.4 oz (58.7 kg)  04/14/23 132 lb (59.9 kg)  03/25/23 132 lb (59.9 kg)     Objective:    Vital Signs:  BP 116/60 (BP Location: Left Arm, Patient Position: Sitting, Cuff Size: Normal)   Pulse 78   Resp 16   Ht 5\' 7"  (1.702 m)   Wt 129  lb 6.4 oz (58.7 kg)   LMP 03/02/1994 (Approximate)   SpO2 98%   BMI 20.27 kg/m   GEN: Well nourished, well developed in no acute distress HEENT: Normal NECK: No JVD; No carotid bruits LYMPHATICS: No lymphadenopathy CARDIAC:RRR, no murmurs, rubs, gallops RESPIRATORY:  Clear to auscultation without rales, wheezing or rhonchi  ABDOMEN: Soft, non-tender, non-distended MUSCULOSKELETAL:  No edema; No deformity  SKIN: Warm and dry NEUROLOGIC:  Alert and oriented x 3 PSYCHIATRIC:  Normal affect  ASSESSMENT & PLAN:    1.  OSA  -she has severe OSA intolerant to PAP therapy -she is now s/p Hypoglossal nerve stimulator implant by Dr. Jenne Pane -her device was activated several months ago with the following programming:              -Sensation threshold 0.5V             -Functional threshold 0.6V -programmed with the following parameters:             -Pulse width 90ms             -rate 33Hz              -Start delay             -Pause time             -Therapy duration 8hr             -Electrodes +-+             -Stimulation settings 0.6-1.6V -she was doing well after 4 weeks of using her device -she had significant improvement in her daytime sleepiness and is not snoring -In lab inspire titration 01/09/2021 showed a therapeutic amplitude of 1.4V with an AHI of 7.5/hr and O2 sats as low as 88%.  Her device was programmed to amplitude of 1.3V  -Office visit 05/06/2021 she was up to 1.5 V and doing very well without any problems at all and no excessive daytime sleepiness.  Therapeutic range changed to amplitude of 1.3 to 1.8 V. -Office visit 11/06/2021 doing well on a level 3 (1.5 V) -Home sleep study 01/16/2022 showed an AHI 1.6/h at 1.3 V but jumped to 8/h at 1.5 V.  She felt good though at 1.5 V. -Office visit 04/27/2022 doing well although Complaning of some snoring on her settings but no changes made >> she was encouraged to avoid sleeping supine to help with snoring -Office visit  11/11/2022 Device interrogation was performed and was on level 3 (1.5V)>>she had inadvertently increased it to level 5 without knowing.  Since her last Sleep study was good at level 3, her level was decreased back to level 3 (1.5V) with a range of 1.3 to 1.7V. -Office visit today 05/17/2023 she is doing well with no daytime sleepiness and no sore throat or tongue discomfort.  She is currently on level 3 (1.5 V).   -Device interrogation demonstrated good waveforms and tongue motion.  No changes were made to programmed settings  Medication Adjustments/Labs and Tests Ordered: Current medicines are reviewed at length with the patient today.  Concerns regarding medicines are outlined above.  No orders of the defined types were placed in this encounter.  No orders of the defined types were placed in this encounter.  Followup with me in 6 months with inspire rep  Medication Adjustments/Labs and Tests Ordered: Current medicines are reviewed at length with the patient today.  Concerns regarding medicines are outlined above.   Tests Ordered: No orders of the defined types were placed in this encounter.   Medication Changes: No orders of the defined types were placed in this encounter.   Follow Up:  After Inspire titration  Signed, Armanda Magic, MD  05/17/2023 10:01 AM    [facility]

## 2023-05-25 ENCOUNTER — Encounter: Payer: Self-pay | Admitting: Family Medicine

## 2023-05-25 ENCOUNTER — Ambulatory Visit (INDEPENDENT_AMBULATORY_CARE_PROVIDER_SITE_OTHER): Admitting: Family Medicine

## 2023-05-25 VITALS — BP 110/60 | HR 69 | Temp 97.6°F | Ht 67.0 in | Wt 129.5 lb

## 2023-05-25 DIAGNOSIS — G4700 Insomnia, unspecified: Secondary | ICD-10-CM

## 2023-05-25 DIAGNOSIS — J479 Bronchiectasis, uncomplicated: Secondary | ICD-10-CM | POA: Insufficient documentation

## 2023-05-25 DIAGNOSIS — R053 Chronic cough: Secondary | ICD-10-CM

## 2023-05-25 MED ORDER — MUCINEX DM 30-600 MG PO TB12: 1.0000 | ORAL_TABLET | Freq: Two times a day (BID) | ORAL | 2 refills | Status: DC

## 2023-05-25 NOTE — Assessment & Plan Note (Addendum)
 Continue current dose of ambien for sleep. She has been on this medication long term and we continue to monitor her for signs of adverse effects. So far she is tolerating it well without any side effects. New rx sent to pharmacy in January, PDMP reviewed

## 2023-05-25 NOTE — Assessment & Plan Note (Signed)
 Seen on CXR last year with indication of possibly emphysema-- lungs are clear on exam today. I advised we obtain a new CT of the chest to look at the lungs to figure out if the bronchiectasis is still present-- I advised trying a sample Breztri inhaler which I gave the patient in the office today to see if this helps with the coughing. Once the CT is resulted then we can decide if she needs to see the specialist for additional testing. Mucinex DM also ordered for patient.

## 2023-05-25 NOTE — Progress Notes (Signed)
 Established Patient Office Visit  Subjective   Patient ID: Brittany [name], female    DOB: 1945/07/16  Age: 78 y.o. MRN: [id number]  Chief Complaint  Patient presents with   Cough    Non-productive with chest congestion for months, states husband is concerned about "coughing fits she has" and denies use of any OTC cough medications    Pt reports a history of chronic cough that has been going on for several months. States that when it first started she had a CXR and was told it was "emphysema" states she only smoked when she was a teen, only for a few years. She reports no fever, chills, no pain in the chest, but states that these coughing fits are often so violent that it causes her to gag/throw up. She reports that it is not very productive, however she has feel something "rattling" in her chest. I reviewed the CXR and also a previous CT of the chest from 2022.   Chronic insomnia-- pt continues to use 1/2 to 1 tablet of ambien at night for sleep. States that it is working well to help her sleep at night. She denies any side effects to the medication.     Current Outpatient Medications  Medication Instructions   acetaminophen (TYLENOL) 500 mg, Every 6 hours PRN   Biotin 5,000 mcg, Daily   Calcium-Magnesium-Zinc (CAL-MAG-ZINC PO) 1 tablet, Daily   citalopram (CELEXA) 40 mg, Oral, Daily   Cyanocobalamin (VITAMIN B-12 PO) 2,500 mcg, Daily   denosumab (PROLIA) 60 mg, Every 6 months   Dextromethorphan-guaiFENesin (MUCINEX DM) 30-600 MG TB12 1 tablet, Oral, 2 times daily   diphenhydrAMINE (BENADRYL) 25 mg, Every 6 hours PRN   famotidine (PEPCID) 40 mg, Oral, Daily   Fish Oil 1,200 mg, Daily   fluticasone (FLONASE) 50 MCG/ACT nasal spray SPRAY 2 SPRAYS INTO EACH NOSTRIL EVERY DAY   linaclotide (LINZESS) 145 mcg, Oral, Daily before breakfast   metoprolol tartrate (LOPRESSOR) 25 mg, Oral, Every 6 hours PRN, For A-Fib   Probiotic Product (PROBIOTIC DAILY PO) 1 tablet, Daily   Repatha SureClick  140 mg, Subcutaneous, Every 14 days   SUMAtriptan (IMITREX) 100 mg, Oral,  Once, Can repeat dose in 2 hours if symptoms persist. Max dose 2 tablets in 24 hours.   SUMAtriptan (IMITREX) 100 mg, Oral, Every 2 hours PRN, May repeat in 2 hours if headache persists or recurs.   Vitamin D 2,000 Units, Daily   zolpidem (AMBIEN) 2.5-5 mg, Oral, At bedtime PRN    Patient Active Problem List   Diagnosis Date Noted   Bronchiectasis without complication (HCC) 05/25/2023   Chronic idiopathic urticaria 04/29/2022   OSA (obstructive sleep apnea) 01/09/2021   Status post total left knee replacement 10/11/2020   Primary osteoarthritis of left knee 10/02/2020   Atrial fibrillation (HCC) 05/13/2018   Bloating 04/11/2018   Abnormal EKG 08/23/2017   Malignant neoplasm of overlapping sites of right breast in female, estrogen receptor positive (HCC) 08/17/2016   Hx of colonic polyps 08/11/2016   Iron deficiency anemia 07/28/2016   Blood donor 07/28/2016   Pain due to total right knee replacement (HCC) 04/06/2016   S/P right TKA 12/31/2015   Baker's cyst, unruptured 09/27/2015   Insomnia 07/31/2015   Onychomycosis 09/25/2014   Urinary frequency 05/21/2014   Mitral valve prolapse 05/08/2014   Mitral regurgitation 05/08/2014   Breast cancer, right breast (HCC) 03/12/2014   Dyslipidemia 02/09/2014   History of radiation therapy    Hiatal hernia  History of chemotherapy    Osteoporosis 03/21/2012   Seasonal and perennial allergic rhinitis 12/17/2011   B12 deficiency 10/09/2009   TOBACCO USE, QUIT 01/31/2009   THYROID NODULE 12/25/2008   GERD 11/23/2007   VARICOSE VEIN 12/29/2006   Anxiety state 12/23/2006   Depression 12/23/2006   Migraine variant 12/23/2006   Disorder of bone and cartilage 12/23/2006      Review of Systems  All other systems reviewed and are negative.     Objective:     BP 110/60   Pulse 69   Temp 97.6 F (36.4 C) (Oral)   Ht 5\' 7"  (1.702 m)   Wt 129 lb 8 oz (58.7  kg)   LMP 03/02/1994 (Approximate)   SpO2 99%   BMI 20.28 kg/m    Physical Exam Vitals reviewed.  Constitutional:      Appearance: Normal appearance. She is well-groomed and normal weight.  Eyes:     Conjunctiva/sclera: Conjunctivae normal.  Cardiovascular:     Rate and Rhythm: Normal rate and regular rhythm.     Heart sounds: S1 normal and S2 normal.  Pulmonary:     Effort: Pulmonary effort is normal.     Breath sounds: Normal breath sounds and air entry.  Musculoskeletal:     Right lower leg: No edema.     Left lower leg: No edema.  Neurological:     Mental Status: She is alert and oriented to person, place, and time. Mental status is at baseline.     Gait: Gait is intact.  Psychiatric:        Mood and Affect: Mood and affect normal.        Speech: Speech normal.        Behavior: Behavior normal.        Judgment: Judgment normal.      No results found for any visits on 05/25/23.    The 10-year ASCVD risk score (Arnett DK, et al., 2019) is: 12%    Assessment & Plan:  Chronic cough -     CT CHEST WO CONTRAST; Future  Bronchiectasis without complication [facility]) Assessment & Plan: Seen on CXR last year with indication of possibly emphysema-- lungs are clear on exam today. I advised we obtain a new CT of the chest to look at the lungs to figure out if the bronchiectasis is still present-- I advised trying a sample Breztri inhaler which I gave the patient in the office today to see if this helps with the coughing. Once the CT is resulted then we can decide if she needs to see the specialist for additional testing. Mucinex DM also ordered for patient.   Orders: -     CT CHEST WO CONTRAST; Future -     Mucinex DM; Take 1 tablet by mouth 2 (two) times daily.  Dispense: 28 tablet; Refill: 2  Insomnia, unspecified type Assessment & Plan: Continue current dose of ambien for sleep. She has been on this medication long term and we continue to monitor her for signs of adverse  effects. So far she is tolerating it well without any side effects. New rx sent to pharmacy in January, PDMP reviewed    I spent 30 minutes with the patient today reviewing old imaging reports, reviewing social history, medications, labs,etc.   Return in about 6 months (around 11/25/2023).    Karie Georges, MD

## 2023-06-01 ENCOUNTER — Ambulatory Visit
Admission: RE | Admit: 2023-06-01 | Discharge: 2023-06-01 | Disposition: A | Discharge status: Home / Self Care | Source: Ambulatory Visit | Attending: Family Medicine | Admitting: Family Medicine

## 2023-06-01 DIAGNOSIS — R053 Chronic cough: Secondary | ICD-10-CM

## 2023-06-01 DIAGNOSIS — J479 Bronchiectasis, uncomplicated: Secondary | ICD-10-CM

## 2023-06-01 DIAGNOSIS — J9811 Atelectasis: Secondary | ICD-10-CM | POA: Diagnosis not present

## 2023-06-02 ENCOUNTER — Encounter: Payer: Self-pay | Admitting: Family Medicine

## 2023-06-02 NOTE — Addendum Note (Signed)
 Addended by: Karie Georges on: 06/02/2023 01:08 PM   Modules accepted: Orders

## 2023-06-10 ENCOUNTER — Encounter: Payer: Self-pay | Admitting: Internal Medicine

## 2023-06-10 ENCOUNTER — Ambulatory Visit: Admitting: Internal Medicine

## 2023-06-10 VITALS — BP 106/63 | HR 76 | Ht 67.0 in | Wt 129.9 lb

## 2023-06-10 DIAGNOSIS — Z87891 Personal history of nicotine dependence: Secondary | ICD-10-CM | POA: Diagnosis not present

## 2023-06-10 DIAGNOSIS — R0982 Postnasal drip: Secondary | ICD-10-CM

## 2023-06-10 DIAGNOSIS — Z853 Personal history of malignant neoplasm of breast: Secondary | ICD-10-CM

## 2023-06-10 DIAGNOSIS — J310 Chronic rhinitis: Secondary | ICD-10-CM

## 2023-06-10 DIAGNOSIS — Z8616 Personal history of COVID-19: Secondary | ICD-10-CM | POA: Diagnosis not present

## 2023-06-10 DIAGNOSIS — R053 Chronic cough: Secondary | ICD-10-CM | POA: Diagnosis not present

## 2023-06-10 DIAGNOSIS — J701 Chronic and other pulmonary manifestations due to radiation: Secondary | ICD-10-CM | POA: Diagnosis not present

## 2023-06-10 LAB — POCT EXHALED NITRIC OXIDE: FeNO level (ppb): 19

## 2023-06-10 MED ORDER — AZELASTINE HCL 0.1 % NA SOLN: 1.0000 | Freq: Two times a day (BID) | NASAL | 12 refills | Status: DC

## 2023-06-10 NOTE — Addendum Note (Signed)
 Addended by: Serafina Royals on: 06/10/2023 01:43 PM   Modules accepted: Orders

## 2023-06-10 NOTE — Progress Notes (Signed)
 Brittany [name]    [id number]    February 09, 1946  Primary Care Physician:Michael, Vinetta Bergamo, MD  Referring Physician: Karie Georges, MD [address] [place],  [place] [address] Reason for Consultation: chronic cough Date of Consultation: 06/10/2023  Chief complaint:   Chief Complaint  Patient presents with   Follow-up    Consult-cough     HPI: Discussed the use of AI scribe software for clinical note transcription with the patient, who gave verbal consent to proceed.  History of Present Illness Brittany [name] is a 78 year old female with a history of breast cancer and atrial fibrillation who presents with a chronic cough. She was referred by her internist for further evaluation after a CT scan.  She has experienced a persistent cough for approximately two and a half years, which began following a COVID-19 infection. The cough is described as irritating and non-productive, occurring about once a day, and sometimes severe enough to induce a gagging sensation. It does not occur at night and is not triggered by specific activities or environmental changes. She has not experienced any previous respiratory symptoms prior to this.  She has tried Mucinex D and uses an albuterol inhaler prescribed by Dr. Casimiro Needle, but neither has provided relief. She has not been prescribed any other medications for the cough.  No shortness of breath, recurrent pneumonia, bronchitis, reflux, or heartburn. She reports frequent sniffing and nasal drainage, which she finds irritating.  Her past medical history includes atrial fibrillation, for which she has undergone an ablation, and breast cancer treated with chemotherapy and radiation in 2010. She also has sleep apnea managed with an Inspire device, which she finds effective.  Her family history is notable for her father having had lung cancer, although he was a non-smoker.  She smoked briefly as a teenager but has no significant smoking history.  She has a history of seasonal allergies, which have improved with the use of Flonase nasal spray.  Social history:  Occupation: retired IT consultant Exposures: lives at home with husband and dog Smoking history: tried cigarettes as a teenager, no regular use. No passive smoke exposure.  Social History   Occupational History    Employer: RETIRED  Tobacco Use   Smoking status: Former    Current packs/day: 0.00    Average packs/day: 0.3 packs/day for 5.0 years (1.3 ttl pk-yrs)    Types: Cigarettes    Start date: 03/02/1962    Quit date: 03/03/1967    Years since quitting: 56.3    Passive exposure: Past   Smokeless tobacco: Never   Tobacco comments:    6 cig per day when she was smoking/quit years ago  Vaping Use   Vaping status: Never Used  Substance and Sexual Activity   Alcohol use: Yes    Alcohol/week: 7.0 standard drinks of alcohol    Types: 7 Cans of beer per week    Comment: 1 beer per day - 2.4 % alcholol - Bud Select 55   Drug use: No   Sexual activity: Yes    Partners: Male    Birth control/protection: Post-menopausal    Relevant family history:  Family History  Problem Relation Age of Onset   Epilepsy Mother        at older age   Dementia Mother        ?uncertain type   Hip fracture Mother        x2   Heart disease Father  Cancer Father        lung-nonsmoker   Cancer - Lung Father    Breast cancer Sister 71   Colon cancer Sister 30   Other Brother        suicide   Other Maternal Grandfather        didn't go to doctor   Esophageal cancer Neg Hx    Stomach cancer Neg Hx     Past Medical History:  Diagnosis Date   A-fib (HCC)    Acid reflux    Acute cystitis with positive culture 03/06/2014   Anemia    Anxiety    Arthritis    Baker cyst, left 04/06/2016   Aspirated 04/06/2016   Breast cancer (HCC) 11/23/2007   right, ER/PR -, HER 2 -   Cognitive deficits 09/25/2014   2016 Very mild, could be related to meds    Degenerative arthritis of left  knee 04/06/2016   Injection and aspiration 04/06/2016   Degenerative arthritis of right knee 09/27/2015   Depression    Esophageal stricture    Gastritis 10/2007   History of chemotherapy    neoadjuvant chemo, last dose 03/2008   History of radiation therapy 07/25/08 -09/11/08   right breast   Hx of colonic polyps 08/11/2016   Hyperlipidemia    Migraines    rare   Myalgia 07/18/2013   5/15 multiple site    OSA (obstructive sleep apnea)    severe OSA AHI 34, intolerant to PAP and now s/p hypoglossal nerve stimulator implant   Osteopenia    Rupture of quadriceps tendon 03/23/2017    Past Surgical History:  Procedure Laterality Date   ATRIAL FIBRILLATION ABLATION N/A 10/07/2018   Procedure: ATRIAL FIBRILLATION ABLATION;  Surgeon: Regan Lemming, MD;  [facility]: [facility];  Service: Cardiovascular;  Laterality: N/A;   BREAST LUMPECTOMY  06/26/2008   right, CA   XRT, chemo   COLONOSCOPY     DRUG INDUCED ENDOSCOPY N/A 02/21/2020   Procedure: DRUG INDUCED ENDOSCOPY;  Surgeon: Christia Reading, MD;  Location: [facility];  Service: ENT;  Laterality: N/A;   EYE SURGERY Bilateral    growth removed per right thigh     as teen - mole   IMPLANTATION OF HYPOGLOSSAL NERVE STIMULATOR N/A 05/15/2020   Procedure: IMPLANTATION OF HYPOGLOSSAL NERVE STIMULATOR;  Surgeon: Christia Reading, MD;  Location[facility];  Service: ENT;  Laterality: N/A;   JOINT REPLACEMENT  12/31/2015   RTK   QUADRICEPS TENDON REPAIR Right 09/14/2016   Procedure: OPEN REPAIR QUADRICEP TENDON;  Surgeon: Durene Romans, MD;  Location: [facility];  Service: Orthopedics;  Laterality: Right;   TOTAL KNEE ARTHROPLASTY Right 12/31/2015   Procedure: RIGHT TOTAL KNEE ARTHROPLASTY;  Surgeon: Durene Romans, MD;  Location: [facility];  Service: Orthopedics;  Laterality: Right;   TOTAL KNEE ARTHROPLASTY Left 10/11/2020   Procedure: TOTAL KNEE ARTHROPLASTY;  Surgeon: Tarry Kos, MD;  Location: [facility];  Service: Orthopedics;   Laterality: Left;   UPPER GASTROINTESTINAL ENDOSCOPY       Physical Exam: Blood pressure 106/63, pulse 76, height 5\' 7"  (1.702 m), weight 129 lb 14.4 oz (58.9 kg), last menstrual period 03/02/1994, SpO2 99%. Gen:      No acute distress, frequent sniffling ENT:  mallampati IV, +cobblestoning and nasal debris, no nasal polyps, mucus membranes moist Lungs:    No increased respiratory effort, symmetric chest Sevey excursion, clear to auscultation bilaterally, no wheezes or crackles CV:  Regular rate and rhythm; no murmurs, rubs, or gallops.  No pedal edema Abd:      + bowel sounds; soft, non-tender; no distension MSK: no acute synovitis of DIP or PIP joints, no mechanics hands.  Skin:      Warm and dry; no rashes Neuro: normal speech, no focal facial asymmetry Psych: alert and oriented x3, normal mood and affect   Data Reviewed/Medical Decision Making:  Independent interpretation of tests: Imaging:  Review of patient's CT Chest March 2025 images revealed apical scarring most likely secondary to history of . The patient's images have been independently reviewed by me.    PFTs:   Labs:  Lab Results  Component Value Date   NA 136 11/03/2022   K 4.3 11/03/2022   CO2 29 11/03/2022   GLUCOSE 81 11/03/2022   BUN 12 11/03/2022   CREATININE 0.63 11/03/2022   CALCIUM 10.1 11/03/2022   GFR 86.93 11/03/2022   EGFR 91 11/06/2021   GFRNONAA >60 12/05/2021   Lab Results  Component Value Date   WBC 5.7 11/03/2022   HGB 13.3 11/03/2022   HCT 40.6 11/03/2022   MCV 95.7 11/03/2022   PLT 304.0 11/03/2022     Immunization status:  Immunization History  Administered Date(s) Administered   Fluad Quad(high Dose 65+) 11/06/2020   Fluad Trivalent(High Dose 65+) 11/03/2022   Influenza Split 12/01/2011   Influenza Whole 12/06/2007, 11/15/2008, 01/06/2010, 11/11/2010   Influenza, High Dose Seasonal PF 12/20/2012, 11/20/2014, 11/21/2017, 11/07/2019   Influenza,inj,Quad PF,6+ Mos  11/30/2013   Influenza,inj,quad, With Preservative 11/02/2016   Influenza-Unspecified 11/27/2015, 10/31/2016, 10/27/2018   Moderna Covid-19 Vaccine Bivalent Booster 37yrs & up 02/15/2021, 12/12/2021   Moderna Sars-Covid-2 Vaccination 03/30/2019, 04/27/2019, 01/08/2020, 09/30/2020   PNEUMOCOCCAL CONJUGATE-20 02/10/2021   Pneumococcal Conjugate-13 03/22/2013   Pneumococcal Polysaccharide-23 01/31/2009, 07/31/2015, 06/20/2019   Rabies, IM 03/04/2015, 03/07/2015, 03/11/2015, 03/18/2015   Td 10/09/2009   Tdap 03/04/2015   Zoster Recombinant(Shingrix) 02/13/2017, 07/03/2017   Zoster, Live 04/18/2009     I reviewed prior external note(s) from primary care  I reviewed the result(s) of the labs and imaging as noted above.   I have ordered Feno  Assessment & Plan Chronic cough Chronic cough likely post-COVID onset, primarily due to postnasal drip from chronic rhinitis.  - Continue Flonase nasal spray with proper technique. - Add Astelin nasal spray - Use Xyzal antihistamine as needed. - Feno obtained today 19 ppb, not consistent with TH2 inflammation. Can try ipratropium nasal spray if no effect with current therapy.  Breast cancer with Radiation Fibrosis, biapical Right-sided breast cancer treated with chemotherapy and radiation in 2010. CT scan shows minor scarring at lung apices from past radiation therapy. No further follow up needed     Return to Care: Return in about 3 months (around 09/09/2023).  Durel Salts, MD Pulmonary and Critical Care Medicine [facility] Office:[phone]  CC: Karie Georges, MD

## 2023-06-10 NOTE — Patient Instructions (Signed)
 It was a pleasure to see you today!  Please schedule follow up with myself in 3 months.  If my schedule is not open yet, we will contact you with a reminder closer to that time. Please call [phone] if you haven't heard from [place] a month before, and always call us sooner if issues or concerns arise. You can also send [place] a message through MyChart, but but aware that this is not to be used for urgent issues and it may take up to 5-7 days to receive a reply. Please be aware that you will likely be able to view your results before I have a chance to respond to them. Please give [place] 5 business days to respond to any non-urgent results.    VISIT SUMMARY:  Today, you were seen for a chronic cough that has been persistent for about two and a half years following a COVID-19 infection. We discussed your symptoms, including the irritating and non-productive nature of the cough, and reviewed your medical history, including atrial fibrillation, breast cancer, and sleep apnea. A CT scan showed minor scarring at the lung apices from past radiation therapy. We have developed a plan to address your chronic cough and associated symptoms.  YOUR PLAN:  -CHRONIC COUGH: Your chronic cough is likely due to postnasal drip from chronic rhinitis, which started after your COVID-19 infection. We will continue the Flonase nasal spray with proper technique in the morning, add Astelin nasal spray at night, and use Xyzal antihistamine as needed.   -BREAST CANCER: Your right-sided breast cancer was treated with chemotherapy and radiation in 2010. The CT scan shows minor scarring at the lung apices from past radiation therapy. No further follow up needed for this.   Flonase and Astelin - 1 spray on each side of your nose twice a day for first week, then 1 spray on each side. Use one in the morning, one at night.  Instructions for use: If you also use a saline nasal spray or rinse, use that first. Position the head with the chin  slightly tucked. Use the right hand to spray into the left nostril and the right hand to spray into the left nostril.   Point the bottle away from the septum of your nose (cartilage that divides the two sides of your nose).  Hold the nostril closed on the opposite side from where you will spray Spray once and gently sniff to pull the medicine into the higher parts of your nose.  Don't sniff too hard as the medicine will drain down the back of your throat instead. Repeat with a second spray on the same side if prescribed. Repeat on the other side of your nose.

## 2023-06-27 ENCOUNTER — Other Ambulatory Visit: Payer: Self-pay | Admitting: Family Medicine

## 2023-06-30 ENCOUNTER — Telehealth: Payer: Self-pay | Admitting: Pharmacy Technician

## 2023-06-30 ENCOUNTER — Encounter: Payer: Self-pay | Admitting: Pharmacy Technician

## 2023-06-30 NOTE — Telephone Encounter (Signed)
 Pharmacy Patient Advocate Encounter  Received notification from [employer] that Prior Authorization for repatha  has been APPROVED from 06/30/23 to 06/29/24. Spoke to pharmacy to process.Copay is $0.00.    PA #/Case ID/Reference #: [id number]

## 2023-06-30 NOTE — Telephone Encounter (Signed)
 Pharmacy Patient Advocate Encounter   Received notification from Onbase that prior authorization for repatha  is required/requested.   Insurance verification completed.   The patient is insured through [facility] AD[employer]/RX ADV[employer] .   Per test claim: PA required; PA submitted to above mentioned insurance via CoverMyMeds Key/confirmation #/EOC [facility] Status is pending

## 2023-07-16 ENCOUNTER — Ambulatory Visit: Admitting: Orthopaedic Surgery

## 2023-07-16 ENCOUNTER — Other Ambulatory Visit (INDEPENDENT_AMBULATORY_CARE_PROVIDER_SITE_OTHER): Payer: Self-pay

## 2023-07-16 DIAGNOSIS — M542 Cervicalgia: Secondary | ICD-10-CM

## 2023-07-16 MED ORDER — TIZANIDINE HCL 4 MG PO TABS: 2.0000 mg | ORAL_TABLET | Freq: Three times a day (TID) | ORAL | 2 refills | Status: DC | PRN

## 2023-07-16 MED ORDER — PREDNISONE 10 MG (21) PO TBPK: ORAL_TABLET | ORAL | 3 refills | Status: DC

## 2023-07-17 NOTE — Progress Notes (Signed)
 Office Visit Note   Patient: Brittany [name]           Date of Birth: 07/11/1945           MRN: [id number] Visit Date: 07/16/2023              Requested by: Aida House, MD [address] [place],  [place] [address] PCP: Aida House, MD   Assessment & Plan: Visit Diagnoses:  1. Neck pain     Plan: History of Present Illness Brittany [name] is a 78 year old female who presents with neck pain following a fall.  She experienced significant neck pain after falling flat on her face and hitting her forehead hard last Friday. The pain is described as stiffness and pain at the base of her neck, extending slightly downward. Movement of her neck side to side and up and down is painful, with difficulty touching her chin to her chest and looking up. There is tenderness at the base of her neck and in the upper trapezius area. No pain, numbness, or tingling radiates down her arms, and there are no issues with her arms or shoulders.  She has been taking Tylenol  for pain management, as it is the only medication she can take due to atrial fibrillation. She is not on a blood thinner.  Physical Exam NECK: Midline neck tenderness on palpation. Tenderness in upper trapezius muscles bilaterally. Limited range of motion in neck flexion and extension. Limited range of motion in neck rotation without radicular pain.  Negative spurling's.  Results RADIOLOGY Neck X-ray: No acute fracture or dislocation. Mild degenerative changes. Degenerative spurring at C6, C5, and slightly at the inferior aspect of C4. (07/16/2023)  Assessment and Plan Degenerative disc disease of cervical spine Degenerative disc disease at C4, C5, and C6 with spurring, exacerbated by fall. No acute fracture, dislocation, or nerve impingement. - Prescribed prednisone  dose pack for six days. - Prescribed muscle relaxant for nighttime, half dose during day if tolerated. - Advised heat therapy. - Suggested massage therapy. -  Consider referral to physical therapy if symptoms persist.  Follow-Up Instructions: No follow-ups on file.   Orders:  Orders Placed This Encounter  Procedures   XR Cervical Spine 2 or 3 views   Meds ordered this encounter  Medications   predniSONE  (STERAPRED UNI-PAK 21 TAB) 10 MG (21) TBPK tablet    Sig: Take as directed    Dispense:  21 tablet    Refill:  3   tiZANidine  (ZANAFLEX ) 4 MG tablet    Sig: Take 0.5 tablets (2 mg total) by mouth every 8 (eight) hours as needed for muscle spasms.    Dispense:  20 tablet    Refill:  2      Subjective: Chief Complaint  Patient presents with   Neck - Pain    Review of Systems  Constitutional: Negative.   HENT: Negative.    Eyes: Negative.   Respiratory: Negative.    Cardiovascular: Negative.   Endocrine: Negative.   Musculoskeletal: Negative.   Neurological: Negative.   Hematological: Negative.   Psychiatric/Behavioral: Negative.    All other systems reviewed and are negative.    Objective: Vital Signs: LMP 03/02/1994 (Approximate)   Physical Exam Vitals and nursing note reviewed.  Constitutional:      Appearance: She is well-developed.  HENT:     Head: Atraumatic.     Nose: Nose normal.  Eyes:     Extraocular Movements: Extraocular movements intact.  Cardiovascular:     Pulses: Normal pulses.  Pulmonary:     Effort: Pulmonary effort is normal.  Abdominal:     Palpations: Abdomen is soft.  Musculoskeletal:     Cervical back: Neck supple.  Skin:    General: Skin is warm.     Capillary Refill: Capillary refill takes less than 2 seconds.  Neurological:     Mental Status: She is alert. Mental status is at baseline.  Psychiatric:        Behavior: Behavior normal.        Thought Content: Thought content normal.        Judgment: Judgment normal.   Imaging: XR Cervical Spine 2 or 3 views Result Date: 07/16/2023 Xrays of the C spine show no acute or structural abnormalities.  Degenerative changes noted at  C4-7.    PMFS History: Patient Active Problem List   Diagnosis Date Noted   Bronchiectasis without complication (HCC) 05/25/2023   Chronic idiopathic urticaria 04/29/2022   OSA (obstructive sleep apnea) 01/09/2021   Status post total left knee replacement 10/11/2020   Primary osteoarthritis of left knee 10/02/2020   Atrial fibrillation (HCC) 05/13/2018   Bloating 04/11/2018   Abnormal EKG 08/23/2017   Malignant neoplasm of overlapping sites of right breast in female, estrogen receptor positive (HCC) 08/17/2016   Hx of colonic polyps 08/11/2016   Iron deficiency anemia 07/28/2016   Blood donor 07/28/2016   Pain due to total right knee replacement (HCC) 04/06/2016   S/P right TKA 12/31/2015   Baker's cyst, unruptured 09/27/2015   Insomnia 07/31/2015   Onychomycosis 09/25/2014   Urinary frequency 05/21/2014   Mitral valve prolapse 05/08/2014   Mitral regurgitation 05/08/2014   Breast cancer, right breast (HCC) 03/12/2014   Dyslipidemia 02/09/2014   History of radiation therapy    Hiatal hernia    History of chemotherapy    Osteoporosis 03/21/2012   Seasonal and perennial allergic rhinitis 12/17/2011   B12 deficiency 10/09/2009   TOBACCO USE, QUIT 01/31/2009   THYROID  NODULE 12/25/2008   GERD 11/23/2007   VARICOSE VEIN 12/29/2006   Anxiety state 12/23/2006   Depression 12/23/2006   Migraine variant 12/23/2006   Disorder of bone and cartilage 12/23/2006   Past Medical History:  Diagnosis Date   A-fib (HCC)    Acid reflux    Acute cystitis with positive culture 03/06/2014   Anemia    Anxiety    Arthritis    Baker cyst, left 04/06/2016   Aspirated 04/06/2016   Breast cancer (HCC) 11/23/2007   right, ER/PR -, HER 2 -   Cognitive deficits 09/25/2014   2016 Very mild, could be related to meds    Degenerative arthritis of left knee 04/06/2016   Injection and aspiration 04/06/2016   Degenerative arthritis of right knee 09/27/2015   Depression    Esophageal stricture     Gastritis 10/2007   History of chemotherapy    neoadjuvant chemo, last dose 03/2008   History of radiation therapy 07/25/08 -09/11/08   right breast   Hx of colonic polyps 08/11/2016   Hyperlipidemia    Migraines    rare   Myalgia 07/18/2013   5/15 multiple site    OSA (obstructive sleep apnea)    severe OSA AHI 34, intolerant to PAP and now s/p hypoglossal nerve stimulator implant   Osteopenia    Rupture of quadriceps tendon 03/23/2017    Family History  Problem Relation Age of Onset   Epilepsy Mother  at older age   Dementia Mother        ?uncertain type   Hip fracture Mother        x2   Heart disease Father    Cancer Father        lung-nonsmoker   Cancer - Lung Father    Breast cancer Sister 80   Colon cancer Sister 74   Other Brother        suicide   Other Maternal Grandfather        didn't go to doctor   Esophageal cancer Neg Hx    Stomach cancer Neg Hx     Past Surgical History:  Procedure Laterality Date   ATRIAL FIBRILLATION ABLATION N/A 10/07/2018   Procedure: ATRIAL FIBRILLATION ABLATION;  Surgeon: Lei Pump, MD;  Location: [facility];  Service: Cardiovascular;  Laterality: N/A;   BREAST LUMPECTOMY  06/26/2008   right, CA   XRT, chemo   COLONOSCOPY     DRUG INDUCED ENDOSCOPY N/A 02/21/2020   Procedure: DRUG INDUCED ENDOSCOPY;  Surgeon: Virgina Grills, MD;  Location: [facility];  Service: ENT;  Laterality: N/A;   EYE SURGERY Bilateral    growth removed per right thigh     as teen - mole   IMPLANTATION OF HYPOGLOSSAL NERVE STIMULATOR N/A 05/15/2020   Procedure: IMPLANTATION OF HYPOGLOSSAL NERVE STIMULATOR;  Surgeon: Virgina Grills, MD;  Location: [facility] OR;  Service: ENT;  Laterality: N/A;   JOINT REPLACEMENT  12/31/2015   RTK   QUADRICEPS TENDON REPAIR Right 09/14/2016   Procedure: OPEN REPAIR QUADRICEP TENDON;  Surgeon: Claiborne Crew, MD;  Location: [facility];  Service: Orthopedics;  Laterality: Right;   TOTAL KNEE  ARTHROPLASTY Right 12/31/2015   Procedure: RIGHT TOTAL KNEE ARTHROPLASTY;  Surgeon: Claiborne Crew, MD;  Location: [facility];  Service: Orthopedics;  Laterality: Right;   TOTAL KNEE ARTHROPLASTY Left 10/11/2020   Procedure: TOTAL KNEE ARTHROPLASTY;  Surgeon: Wes Hamman, MD;  Location: [facility];  Service: Orthopedics;  Laterality: Left;   UPPER GASTROINTESTINAL ENDOSCOPY     Social History   Occupational History    Employer: RETIRED  Tobacco Use   Smoking status: Former    Current packs/day: 0.00    Average packs/day: 0.3 packs/day for 5.0 years (1.3 ttl pk-yrs)    Types: Cigarettes    Start date: 03/02/1962    Quit date: 03/03/1967    Years since quitting: 56.4    Passive exposure: Past   Smokeless tobacco: Never   Tobacco comments:    6 cig per day when she was smoking/quit years ago  Vaping Use   Vaping status: Never Used  Substance and Sexual Activity   Alcohol use: Yes    Alcohol/week: 7.0 standard drinks of alcohol    Types: 7 Cans of beer per week    Comment: 1 beer per day - 2.4 % alcholol - Bud Select 55   Drug use: No   Sexual activity: Yes    Partners: Male    Birth control/protection: Post-menopausal

## 2023-07-23 ENCOUNTER — Other Ambulatory Visit: Payer: Self-pay | Admitting: Family Medicine

## 2023-07-23 DIAGNOSIS — G4700 Insomnia, unspecified: Secondary | ICD-10-CM

## 2023-07-29 ENCOUNTER — Encounter: Payer: Self-pay | Admitting: Internal Medicine

## 2023-07-29 NOTE — Telephone Encounter (Signed)
**Note De-identified  [place] Obfuscation** Please advise 

## 2023-08-02 MED ORDER — IPRATROPIUM BROMIDE 0.03 % NA SOLN: 2.0000 | Freq: Three times a day (TID) | NASAL | 12 refills | Status: AC | PRN

## 2023-08-02 NOTE — Addendum Note (Signed)
 Addended by: Derrika Ruffalo on: 08/02/2023 04:38 PM   Modules accepted: Orders

## 2023-08-08 ENCOUNTER — Other Ambulatory Visit: Payer: Self-pay | Admitting: Family Medicine

## 2023-08-08 DIAGNOSIS — F411 Generalized anxiety disorder: Secondary | ICD-10-CM

## 2023-08-08 DIAGNOSIS — K219 Gastro-esophageal reflux disease without esophagitis: Secondary | ICD-10-CM

## 2023-09-15 ENCOUNTER — Encounter: Payer: Self-pay | Admitting: Internal Medicine

## 2023-09-15 ENCOUNTER — Ambulatory Visit (INDEPENDENT_AMBULATORY_CARE_PROVIDER_SITE_OTHER): Admitting: Internal Medicine

## 2023-09-15 VITALS — BP 110/72 | HR 77 | Ht 67.0 in | Wt 130.0 lb

## 2023-09-15 DIAGNOSIS — R053 Chronic cough: Secondary | ICD-10-CM | POA: Diagnosis not present

## 2023-09-15 DIAGNOSIS — J301 Allergic rhinitis due to pollen: Secondary | ICD-10-CM

## 2023-09-15 DIAGNOSIS — R0982 Postnasal drip: Secondary | ICD-10-CM | POA: Diagnosis not present

## 2023-09-15 MED ORDER — MONTELUKAST SODIUM 10 MG PO TABS: 10.0000 mg | ORAL_TABLET | Freq: Every day | ORAL | 11 refills | Status: DC

## 2023-09-15 NOTE — Patient Instructions (Signed)
 It was a pleasure to see you today!  Please schedule follow up with myself in 1 year.  If my schedule is not open yet, we will contact you with a reminder closer to that time. Please call [phone] if you haven't heard from us  a month before, and always call us  sooner if issues or concerns arise. You can also send us  a message through MyChart, but but aware that this is not to be used for urgent issues and it may take up to 5-7 days to receive a reply. Please be aware that you will likely be able to view your results before I have a chance to respond to them. Please give us  5 business days to respond to any non-urgent results.   Continue flonase  once a day Increase ipratropium to twice daily - this can be taken up to 4 times a day.  Add montelukast  one pill once a day to help with nasal drainage. You can add xyzal this if montelukast  is not enough.   Let me know if you want to see allergy  about the drainage and do testing and shots again.

## 2023-09-15 NOTE — Progress Notes (Signed)
 Brittany [name]    [id number]    10/23/1945  Primary Care Physician:Michael, Heron HERO, MD Date of Appointment: 09/15/2023 Established Patient Visit  Chief complaint:   Chief Complaint  Patient presents with   Medical Management of Chronic Issues    Follow up     HPI: Brittany [name] is a 78 y.o. woman with chronic cough felt to be secondary to post nasal drainage. 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   Interval Updates: Here for follow up. Was not able to tolerate azelastine  nasal spray so tried ipratropium instead.   She is coughing 2-3 times/week. Dry cough, feels shallow, resolves spontaneously.  She is taking flonase  once a day and ipratropium nasal spray once a day and continues to sniff and feels self conscious when people comment on her cough.   Able to complete adls, speak without difficulty. No voice hoarseness.   Continues to have post nasal darinage.   She does not take a regular anti-histamine.   She has a history of allergic rhinitis and SCIT.     I have reviewed the patient's family social and past medical history and updated as appropriate.   Past Medical History:  Diagnosis Date   A-fib (HCC)    Acid reflux    Acute cystitis with positive culture 03/06/2014   Anemia    Anxiety    Arthritis    Baker cyst, left 04/06/2016   Aspirated 04/06/2016   Breast cancer (HCC) 11/23/2007   right, ER/PR -, HER 2 -   Cognitive deficits 09/25/2014   2016 Very mild, could be related to meds    Degenerative arthritis of left knee 04/06/2016   Injection and aspiration 04/06/2016   Degenerative arthritis of right knee 09/27/2015   Depression    Esophageal stricture    Gastritis 10/2007   History of chemotherapy    neoadjuvant chemo, last dose 03/2008   History of radiation therapy 07/25/08 -09/11/08   right breast   Hx of colonic polyps 08/11/2016   Hyperlipidemia    Migraines    rare   Myalgia 07/18/2013    5/15 multiple site    OSA (obstructive sleep apnea)    severe OSA AHI 34, intolerant to PAP and now s/p hypoglossal nerve stimulator implant   Osteopenia    Rupture of quadriceps tendon 03/23/2017    Past Surgical History:  Procedure Laterality Date   ATRIAL FIBRILLATION ABLATION N/A 10/07/2018   Procedure: ATRIAL FIBRILLATION ABLATION;  Surgeon: Inocencio Soyla Lunger, MD;  Location: [facility];  Service: Cardiovascular;  Laterality: N/A;   BREAST LUMPECTOMY  06/26/2008   right, CA   XRT, chemo   COLONOSCOPY     DRUG INDUCED ENDOSCOPY N/A 02/21/2020   Procedure: DRUG INDUCED ENDOSCOPY;  Surgeon: Carlie Clark, MD;  Location: [facility];  Service: ENT;  Laterality: N/A;   EYE SURGERY Bilateral    growth removed per right thigh     as teen - mole   IMPLANTATION OF HYPOGLOSSAL NERVE STIMULATOR N/A 05/15/2020   Procedure: IMPLANTATION OF HYPOGLOSSAL NERVE STIMULATOR;  Surgeon: Carlie Clark, MD;  Location: [facility];  Service: ENT;  Laterality: N/A;   JOINT REPLACEMENT  12/31/2015   RTK   QUADRICEPS TENDON REPAIR Right 09/14/2016   Procedure: OPEN REPAIR QUADRICEP TENDON;  Surgeon: Ernie Cough, MD;  Location: [facility];  Service: Orthopedics;  Laterality: Right;   TOTAL KNEE ARTHROPLASTY Right  12/31/2015   Procedure: RIGHT TOTAL KNEE ARTHROPLASTY;  Surgeon: Donnice Car, MD;  Location: [facility];  Service: Orthopedics;  Laterality: Right;   TOTAL KNEE ARTHROPLASTY Left 10/11/2020   Procedure: TOTAL KNEE ARTHROPLASTY;  Surgeon: Jerri Kay HERO, MD;  Location: [facility];  Service: Orthopedics;  Laterality: Left;   UPPER GASTROINTESTINAL ENDOSCOPY      Family History  Problem Relation Age of Onset   Epilepsy Mother        at older age   Dementia Mother        ?uncertain type   Hip fracture Mother        x2   Heart disease Father    Cancer Father        lung-nonsmoker   Cancer - Lung Father    Breast cancer Sister 46   Colon cancer Sister 7   Other Brother         suicide   Other Maternal Grandfather        didn't go to doctor   Esophageal cancer Neg Hx    Stomach cancer Neg Hx     Social History   Occupational History    Employer: RETIRED  Tobacco Use   Smoking status: Former    Current packs/day: 0.00    Average packs/day: 0.3 packs/day for 5.0 years (1.3 ttl pk-yrs)    Types: Cigarettes    Start date: 03/02/1962    Quit date: 03/03/1967    Years since quitting: 56.5    Passive exposure: Past   Smokeless tobacco: Never   Tobacco comments:    6 cig per day when she was smoking/quit years ago  Vaping Use   Vaping status: Never Used  Substance and Sexual Activity   Alcohol use: Yes    Alcohol/week: 7.0 standard drinks of alcohol    Types: 7 Cans of beer per week    Comment: 1 beer per day - 2.4 % alcholol - [employer] 55   Drug use: No   Sexual activity: Yes    Partners: Male    Birth control/protection: Post-menopausal     Physical Exam: Blood pressure 110/72, pulse 77, height 5' 7 (1.702 m), weight 130 lb (59 kg), last menstrual period 03/02/1994, SpO2 97%.  Gen:      No acute distress ENT:  +cobblestoning in oropharynx, mild septal erythema no nasal polyps, mucus membranes moist Lungs:    No increased respiratory effort, symmetric chest Bare excursion, clear to auscultation bilaterally, no wheezes or crackles CV:         Regular rate and rhythm; no murmurs, rubs, or gallops.  No pedal edema   Data Reviewed: Imaging: I have personally reviewed the   PFTs:      No data to display         I have personally reviewed the patient's PFTs and   Labs: Lab Results  Component Value Date   NA 136 11/03/2022   K 4.3 11/03/2022   CO2 29 11/03/2022   GLUCOSE 81 11/03/2022   BUN 12 11/03/2022   CREATININE 0.63 11/03/2022   CALCIUM  10.1 11/03/2022   GFR 86.93 11/03/2022   EGFR 91 11/06/2021   GFRNONAA >60 12/05/2021   Lab Results  Component Value Date   WBC 5.7 11/03/2022   HGB 13.3 11/03/2022   HCT 40.6 11/03/2022    MCV 95.7 11/03/2022   PLT 304.0 11/03/2022    Immunization status: Immunization History  Administered Date(s) Administered   Fluad Quad(high Dose 65+) 11/06/2020  Fluad Trivalent(High Dose 65+) 11/03/2022   Influenza Split 12/01/2011   Influenza Whole 12/06/2007, 11/15/2008, 01/06/2010, 11/11/2010   Influenza, High Dose Seasonal PF 12/20/2012, 11/20/2014, 11/21/2017, 11/07/2019   Influenza,inj,Quad PF,6+ Mos 11/30/2013   Influenza,inj,quad, With Preservative 11/02/2016   Influenza-Unspecified 11/27/2015, 10/31/2016, 10/27/2018   Moderna Covid-19 Vaccine Bivalent Booster 11yrs & up 02/15/2021, 12/12/2021   Moderna Sars-Covid-2 Vaccination 03/30/2019, 04/27/2019, 01/08/2020, 09/30/2020   PNEUMOCOCCAL CONJUGATE-20 02/10/2021   Pneumococcal Conjugate-13 03/22/2013   Pneumococcal Polysaccharide-23 01/31/2009, 07/31/2015, 06/20/2019   Rabies, IM 03/04/2015, 03/07/2015, 03/11/2015, 03/18/2015   Td 10/09/2009   Tdap 03/04/2015   Zoster Recombinant(Shingrix) 02/13/2017, 07/03/2017   Zoster, Live 04/18/2009    External Records Personally Reviewed:   Assessment:  Chronic cough secondary to post nasal drainage.  Plan/Recommendations: Continue flonase  once a day Increase ipratropium to twice daily - this can be taken up to 4 times a day.  Add montelukast  one pill once a day to help with nasal drainage. You can add xyzal this if montelukast  is not enough.   Let me know if you want to see allergy  about the drainage and do testing and shots again.    Return to Care: Return in about 1 year (around 09/14/2024).   Verdon Gore, MD Pulmonary and Critical Care Medicine [facility] [phone]

## 2023-09-23 ENCOUNTER — Other Ambulatory Visit: Payer: Self-pay | Admitting: Family Medicine

## 2023-09-29 ENCOUNTER — Encounter: Payer: Self-pay | Admitting: Family Medicine

## 2023-10-06 ENCOUNTER — Encounter: Payer: Self-pay | Admitting: Family Medicine

## 2023-10-06 ENCOUNTER — Ambulatory Visit: Admitting: Family Medicine

## 2023-10-06 VITALS — BP 100/70 | HR 77 | Temp 98.2°F | Ht 67.0 in | Wt 130.1 lb

## 2023-10-06 DIAGNOSIS — G3184 Mild cognitive impairment, so stated: Secondary | ICD-10-CM

## 2023-10-06 NOTE — Progress Notes (Signed)
 Established Patient Office Visit  Subjective   Patient ID: Brittany [name], female    DOB: 1945-06-03  Age: 78 y.o. MRN: [id number]  Chief Complaint  Patient presents with   Memory Loss    Patient complains of problems with memory loss for years, worse recently, states she almost forgot how to get to the office today and this has happened a few times before    Pt is here to discuss her memory issues. She reports that it seems to be getting worse. States that what comes out of her mouth isn't what she is trying to say in her brain.  No mood changes, no sadness, crying spells or depression symptoms. States that she doe shave some worrying over issues with her daughter but she recognizes it and is coping with this well, isn't really interfering with her ability to function. No other stroke like symptoms, no dizziness or passing out, no stroke like symptoms. States that she has fallen twice in the last month or so, had mis-stepped a couple of times.     Current Outpatient Medications  Medication Instructions   acetaminophen  (TYLENOL ) 500 mg, Every 6 hours PRN   Biotin  5,000 mcg, Daily   Calcium -Magnesium -Zinc (CAL-MAG-ZINC PO) 1 tablet, Daily   citalopram  (CELEXA ) 40 mg, Oral, Daily   Cyanocobalamin  (VITAMIN B-12 PO) 2,500 mcg, Daily   denosumab  (PROLIA ) 60 mg, Every 6 months   Dextromethorphan-guaiFENesin  (MUCINEX  DM) 30-600 MG TB12 1 tablet, Oral, 2 times daily   diphenhydrAMINE  (BENADRYL ) 25 mg, Every 6 hours PRN   famotidine  (PEPCID ) 40 mg, Oral, Daily   Fish Oil 1,200 mg, Daily   fluticasone  (FLONASE ) 50 MCG/ACT nasal spray SPRAY 2 SPRAYS INTO EACH NOSTRIL EVERY DAY   ipratropium (ATROVENT ) 0.03 % nasal spray 2 sprays, Each Nare, 3 times daily PRN   linaclotide  (LINZESS ) 145 mcg, Oral, Daily before breakfast   Magnesium  250 MG CAPS    metoprolol  tartrate (LOPRESSOR ) 25 mg, Oral, Every 6 hours PRN, For A-Fib   Probiotic Product (PROBIOTIC DAILY PO) 1 tablet, Daily   Repatha   SureClick 140 mg, Subcutaneous, Every 14 days   SUMAtriptan  (IMITREX ) 100 mg, Oral,  Once, Can repeat dose in 2 hours if symptoms persist. Max dose 2 tablets in 24 hours.   Vitamin D  2,000 Units, Daily   zolpidem  (AMBIEN ) 2.5-5 mg, Oral, At bedtime PRN    Patient Active Problem List   Diagnosis Date Noted   Bronchiectasis without complication (HCC) 05/25/2023   Chronic idiopathic urticaria 04/29/2022   OSA (obstructive sleep apnea) 01/09/2021   Status post total left knee replacement 10/11/2020   Primary osteoarthritis of left knee 10/02/2020   Atrial fibrillation (HCC) 05/13/2018   Bloating 04/11/2018   Abnormal EKG 08/23/2017   Malignant neoplasm of overlapping sites of right breast in female, estrogen receptor positive (HCC) 08/17/2016   Hx of colonic polyps 08/11/2016   Iron deficiency anemia 07/28/2016   Blood donor 07/28/2016   Pain due to total right knee replacement (HCC) 04/06/2016   S/P right TKA 12/31/2015   Baker's cyst, unruptured 09/27/2015   Insomnia 07/31/2015   Onychomycosis 09/25/2014   Urinary frequency 05/21/2014   Mitral valve prolapse 05/08/2014   Mitral regurgitation 05/08/2014   Breast cancer, right breast (HCC) 03/12/2014   Dyslipidemia 02/09/2014   History of radiation therapy    Hiatal hernia    History of chemotherapy    Osteoporosis 03/21/2012   Seasonal and perennial allergic rhinitis 12/17/2011   B12 deficiency 10/09/2009  TOBACCO USE, QUIT 01/31/2009   THYROID  NODULE 12/25/2008   GERD 11/23/2007   VARICOSE VEIN 12/29/2006   Anxiety state 12/23/2006   Depression 12/23/2006   Migraine variant 12/23/2006   Disorder of bone and cartilage 12/23/2006      Review of Systems  All other systems reviewed and are negative.     Objective:     BP 100/70   Pulse 77   Temp 98.2 F (36.8 C) (Oral)   Ht 5' 7 (1.702 m)   Wt 130 lb 1.6 oz (59 kg)   LMP 03/02/1994 (Approximate)   SpO2 99%   BMI 20.38 kg/m    Physical Exam Vitals  reviewed.  Constitutional:      Appearance: Normal appearance. She is well-groomed and normal weight.  Cardiovascular:     Rate and Rhythm: Normal rate and regular rhythm.     Heart sounds: S1 normal and S2 normal.  Pulmonary:     Effort: Pulmonary effort is normal.  Musculoskeletal:     Right lower leg: No edema.     Left lower leg: No edema.  Neurological:     General: No focal deficit present.     Mental Status: She is alert and oriented to person, place, and time. Mental status is at baseline.     Gait: Gait is intact.  Psychiatric:        Mood and Affect: Mood and affect normal.        Speech: Speech normal.        Behavior: Behavior normal.        Judgment: Judgment normal.      No results found for any visits on 10/06/23.    The 10-year ASCVD risk score (Arnett DK, et al., 2019) is: 11.4%    Assessment & Plan:  Mild cognitive impairment -     Ambulatory referral to Neurology   Pt reports worsening of her mentation, most likely MCI, I counseled the patient for 20 minutes on behavioral strategies and changes, we discussed that medication at this stage is often not helpful. I recommended further testing and work up to be done by neurology, pt is agreeable to the referral.   Return in about 1 month (around 11/06/2023) for annual physical exam - come fasting for labs, anytime sept or october.    Heron CHRISTELLA Sharper, MD

## 2023-10-06 NOTE — Patient Instructions (Signed)
 Get covid booster and flu shot before your trip to [place]

## 2023-10-15 ENCOUNTER — Encounter: Payer: Self-pay | Admitting: Family Medicine

## 2023-10-18 ENCOUNTER — Encounter: Payer: Self-pay | Admitting: Physician Assistant

## 2023-10-27 ENCOUNTER — Other Ambulatory Visit: Payer: Self-pay | Admitting: Family Medicine

## 2023-10-27 DIAGNOSIS — K219 Gastro-esophageal reflux disease without esophagitis: Secondary | ICD-10-CM

## 2023-10-27 DIAGNOSIS — F411 Generalized anxiety disorder: Secondary | ICD-10-CM

## 2023-11-02 DIAGNOSIS — H43811 Vitreous degeneration, right eye: Secondary | ICD-10-CM | POA: Diagnosis not present

## 2023-11-02 DIAGNOSIS — H43392 Other vitreous opacities, left eye: Secondary | ICD-10-CM | POA: Diagnosis not present

## 2023-11-12 ENCOUNTER — Encounter: Payer: Self-pay | Admitting: Cardiology

## 2023-11-25 ENCOUNTER — Encounter: Admitting: Family Medicine

## 2023-12-08 ENCOUNTER — Encounter: Admitting: Family Medicine

## 2023-12-09 ENCOUNTER — Inpatient Hospital Stay: Payer: PPO | Attending: Hematology and Oncology | Admitting: Hematology and Oncology

## 2023-12-09 VITALS — BP 120/74 | HR 73 | Temp 97.8°F | Resp 18 | Ht 67.0 in | Wt 129.9 lb

## 2023-12-09 DIAGNOSIS — C50511 Malignant neoplasm of lower-outer quadrant of right female breast: Secondary | ICD-10-CM | POA: Diagnosis not present

## 2023-12-09 DIAGNOSIS — Z79899 Other long term (current) drug therapy: Secondary | ICD-10-CM | POA: Insufficient documentation

## 2023-12-09 DIAGNOSIS — Z171 Estrogen receptor negative status [ER-]: Secondary | ICD-10-CM | POA: Insufficient documentation

## 2023-12-09 DIAGNOSIS — Z885 Allergy status to narcotic agent status: Secondary | ICD-10-CM | POA: Diagnosis not present

## 2023-12-09 DIAGNOSIS — C50811 Malignant neoplasm of overlapping sites of right female breast: Secondary | ICD-10-CM | POA: Diagnosis present

## 2023-12-09 DIAGNOSIS — C50911 Malignant neoplasm of unspecified site of right female breast: Secondary | ICD-10-CM

## 2023-12-09 DIAGNOSIS — M810 Age-related osteoporosis without current pathological fracture: Secondary | ICD-10-CM | POA: Diagnosis not present

## 2023-12-09 DIAGNOSIS — Z17421 Hormone receptor negative with human epidermal growth factor receptor 2 negative status: Secondary | ICD-10-CM | POA: Insufficient documentation

## 2023-12-09 DIAGNOSIS — Z9221 Personal history of antineoplastic chemotherapy: Secondary | ICD-10-CM | POA: Diagnosis not present

## 2023-12-09 DIAGNOSIS — Z923 Personal history of irradiation: Secondary | ICD-10-CM | POA: Diagnosis not present

## 2023-12-09 NOTE — Progress Notes (Signed)
 Patient Care Team: Brittany Heron HERO, MD as PCP - General (Family Medicine) Jeffrie Oneil BROCKS, MD as PCP - Cardiology (Cardiology) Inocencio Soyla Lunger, MD as PCP - Electrophysiology (Cardiology) Shlomo Wilbert SAUNDERS, MD as PCP - Sleep Medicine (Cardiology) Shlomo Wilbert SAUNDERS, MD as Consulting Physician (Sleep Medicine) Liane Sharyne MATSU, [facility] (Inactive) as Pharmacist (Pharmacist) Jerri Kay HERO, MD as Attending Physician (Orthopedic Surgery)  DIAGNOSIS:  Encounter Diagnosis  Name Primary?   Malignant neoplasm of right breast in female, estrogen receptor negative, unspecified site of breast (HCC) Yes    SUMMARY OF ONCOLOGIC HISTORY: Oncology History  Breast cancer, right breast (HCC)  11/22/2007 Breast US    Right breast with irregular, hypoechoic mass measuring up to 1.9 cm. Coorelated with palpable finding.  LNs appeared normal.     11/22/2007 Mammogram   Right breast with suspicious 1.5 cm ill-defined density with associated microcalcs.   11/23/2007 Initial Biopsy   Right breast biopsy revealed grade 2-3 IDC. ER- (0%)/PR- (0%)/HER2 neg.  Ki76 50%.    11/23/2007 Initial Diagnosis   T1cN0, Stage IA right breast invasive ductal carcinoma   12/08/2007 Breast MRI   Solitary abnormally enhancing right breast mass at 8 o'clock location, corresponding with bx-proven IDC. No MRI evidence of multifocal/multicentric or contralateral disease.    12/15/2007 Echocardiogram   Pre-treatment EF: 60%.    12/27/2007 -  Neo-Adjuvant Chemotherapy   Docetaxel/Gemcitabine/Bevacizumab x 4 cycles,  then Doxorubicin/Cytoxan x 4 cycles with Bevacizumab x 2 cycles per NSABP Protocol B-40.  Pt received last Bevacizumab dose in 03/2008.   06/12/2008 Echocardiogram   EF normal: 65-70%   06/26/2008 Definitive Surgery   Right lumpectomy with right axillary SLNB revealed no residual carcinoma and complete pathologic response.  Margins neg.  0/2 SLN positive for metastatic carcinoma.    06/26/2008 Pathologic Stage    ypT0 ypN0(i-): No evidence of disease.     - 09/11/2008 Radiation Therapy   Completed right breast radiation therapy. Total dose 61 Gy.  Completed 08/2008.   06/13/2009 Echocardiogram   EF normal: 55-60%.    12/2011 Miscellaneous   Began osteoporosis treatment with Zoledronate.  Completed therapy in 10/2012.    05/26/2013 Imaging   DEXA scan: T-score -2.3, low bone mass. Improvement in BMD of lumbar spine since last scan 05/2011.  Recs to repeat in 2 years.    11/30/2013 Miscellaneous   Began osteoporosis treatment with Prolia /Denosumab .  Receives infusions every 6 months.    02/09/2014 Echocardiogram   Echocardiographic Stress Test: negative with recommendations to repeat in 1 year per cardiology specialists.    06/29/2014 Survivorship   Survivorship Care Plan mailed to patient at her request in lieu of an in-person meeting in Survivorship Clinic.      CHIEF COMPLIANT: Surveillance of breast cancer  HISTORY OF PRESENT ILLNESS:  History of Present Illness Brittany [name] is a 78 year old female who presents for a routine follow-up visit regarding her history of breast cancer.  She is 16 years post-diagnosis of estrogen receptor-negative breast cancer with no changes in her medications since the last visit.  She denies any lumps or nodules in the breast.     ALLERGIES:  is allergic to lipitor [atorvastatin ], boniva [ibandronate], hydrocodone -acetaminophen , oxycodone , and tramadol .  MEDICATIONS:  Current Outpatient Medications  Medication Sig Dispense Refill   acetaminophen  (TYLENOL ) 500 MG tablet Take 500 mg by mouth every 6 (six) hours as needed.     Biotin  5000 MCG TABS Take 5,000 mcg by mouth daily.  Calcium -Magnesium -Zinc (CAL-MAG-ZINC PO) Take 1 tablet by mouth daily. 600 mg     Cholecalciferol  (VITAMIN D ) 50 MCG (2000 UT) tablet Take 2,000 Units by mouth daily.     citalopram  (CELEXA ) 40 MG tablet TAKE 1 TABLET BY MOUTH EVERY DAY 90 tablet 0   Cyanocobalamin  (VITAMIN B-12  PO) Take 2,500 mcg by mouth daily.     denosumab  (PROLIA ) 60 MG/ML SOSY injection Inject 60 mg into the skin every 6 (six) months.     diphenhydrAMINE  (BENADRYL ) 25 MG tablet Take 25 mg by mouth every 6 (six) hours as needed for allergies.     Evolocumab  (REPATHA  SURECLICK) 140 MG/ML SOAJ Inject 140 mg into the skin every 14 (fourteen) days. 6 mL 3   famotidine  (PEPCID ) 40 MG tablet TAKE 1 TABLET BY MOUTH EVERY DAY 90 tablet 0   fluticasone  (FLONASE ) 50 MCG/ACT nasal spray SPRAY 2 SPRAYS INTO EACH NOSTRIL EVERY DAY 48 mL 0   ipratropium (ATROVENT ) 0.03 % nasal spray Place 2 sprays into both nostrils 3 (three) times daily as needed for rhinitis. 30 mL 12   Magnesium  250 MG CAPS      metoprolol  tartrate (LOPRESSOR ) 25 MG tablet Take 1 tablet (25 mg total) by mouth every 6 (six) hours as needed. For A-Fib 30 tablet 3   Omega-3 Fatty Acids (FISH OIL) 1200 MG CAPS Take 1,200 mg by mouth daily.     Probiotic Product (PROBIOTIC DAILY PO) Take 1 tablet by mouth daily.     SUMAtriptan  (IMITREX ) 100 MG tablet Take 1 tablet (100 mg total) by mouth once for 1 dose. Can repeat dose in 2 hours if symptoms persist. Max dose 2 tablets in 24 hours. 9 tablet 5   zolpidem  (AMBIEN ) 5 MG tablet TAKE 0.5-1 TABLETS (2.5-5 MG TOTAL) BY MOUTH AT BEDTIME AS NEEDED FOR SLEEP. 90 tablet 0   Dextromethorphan-guaiFENesin  (MUCINEX  DM) 30-600 MG TB12 Take 1 tablet by mouth 2 (two) times daily. (Patient not taking: Reported on 12/09/2023) 28 tablet 2   No current facility-administered medications for this visit.    PHYSICAL EXAMINATION: ECOG PERFORMANCE STATUS: 1 - Symptomatic but completely ambulatory  Vitals:   12/09/23 1053  BP: 120/74  Pulse: 73  Resp: 18  Temp: 97.8 F (36.6 C)  SpO2: 100%   Filed Weights   12/09/23 1053  Weight: 129 lb 14.4 oz (58.9 kg)    Physical Exam No palpable lumps or nodules in bilateral breasts or axilla scar tissue in the right breast around the nipple  (exam performed in the  presence of a chaperone)  LABORATORY DATA:  I have reviewed the data as listed    Latest Ref Rng & Units 11/03/2022   11:21 AM 04/02/2022    1:43 PM 12/05/2021   11:20 AM  CMP  Glucose 70 - 99 mg/dL 81   99   BUN 6 - 23 mg/dL 12   10   Creatinine 9.59 - 1.20 mg/dL 9.36   9.34   Sodium 864 - 145 mEq/L 136   136   Potassium 3.5 - 5.1 mEq/L 4.3   4.4   Chloride 96 - 112 mEq/L 100   101   CO2 19 - 32 mEq/L 29   32   Calcium  8.4 - 10.5 mg/dL 89.8   9.5   Total Protein 6.0 - 8.3 g/dL 7.4  7.1  7.5   Total Bilirubin 0.2 - 1.2 mg/dL 0.5  0.3  0.4   Alkaline Phos 39 - 117 U/L 59  68  63   AST 0 - 37 U/L 18  15  15    ALT 0 - 35 U/L 10  9  10      Lab Results  Component Value Date   WBC 5.7 11/03/2022   HGB 13.3 11/03/2022   HCT 40.6 11/03/2022   MCV 95.7 11/03/2022   PLT 304.0 11/03/2022   NEUTROABS 3.6 11/03/2022    ASSESSMENT & PLAN:  Breast cancer, right breast (HCC) 11/2007: Clinical T1CN0 stage Ia grade 2-3 IDC triple negative Ki-67 50% January 2010: Completed neoadjuvant chemotherapy with Taxotere gemcitabine bevacizumab followed by Adriamycin Cytoxan bevacizumab NSABP B40 April 2010: Right lumpectomy: Complete pathologic response July 2010: Completed adjuvant radiation   Breast cancer surveillance: 1.  Breast exam 12/09/2023: Benign 2. mammogram 03/06/2023: At [facility] benign breast density category C 3.  Will order guardant reveal for MRD testing   Patient wishes to follow-up once a year indefinitely.   No orders of the defined types were placed in this encounter.  The patient has a good understanding of the overall plan. she agrees with it. she will call with any problems that may develop before the next visit here.  I personally spent a total of 30 minutes in the care of the patient today including preparing to see the patient, getting/reviewing separately obtained history, performing a medically appropriate exam/evaluation, counseling and educating, placing orders, referring  and communicating with other health care professionals, documenting clinical information in the EHR, independently interpreting results, communicating results, and coordinating care.   Viinay K Almalik Weissberg, MD 12/09/23

## 2023-12-09 NOTE — Assessment & Plan Note (Signed)
 11/2007: Clinical T1CN0 stage Ia grade 2-3 IDC triple negative Ki-67 50% January 2010: Completed neoadjuvant chemotherapy with Taxotere gemcitabine bevacizumab followed by Adriamycin Cytoxan bevacizumab NSABP B40 April 2010: Right lumpectomy: Complete pathologic response July 2010: Completed adjuvant radiation   Breast cancer surveillance: 1.  Breast exam 12/09/2023: Benign 2. mammogram 03/06/2023: At [facility] benign breast density category C   Patient wishes to follow-up once a year indefinitely.

## 2023-12-10 ENCOUNTER — Encounter: Payer: Self-pay | Admitting: *Deleted

## 2023-12-10 NOTE — Progress Notes (Signed)
 Per MD request, RN placed order for Guardant Reveal via their ordering portal.

## 2023-12-17 ENCOUNTER — Encounter: Payer: Self-pay | Admitting: Family Medicine

## 2023-12-17 ENCOUNTER — Ambulatory Visit (INDEPENDENT_AMBULATORY_CARE_PROVIDER_SITE_OTHER): Admitting: Family Medicine

## 2023-12-17 VITALS — BP 110/78 | HR 75 | Temp 97.8°F | Ht 66.5 in | Wt 129.5 lb

## 2023-12-17 DIAGNOSIS — R0982 Postnasal drip: Secondary | ICD-10-CM | POA: Diagnosis not present

## 2023-12-17 DIAGNOSIS — I480 Paroxysmal atrial fibrillation: Secondary | ICD-10-CM

## 2023-12-17 DIAGNOSIS — E785 Hyperlipidemia, unspecified: Secondary | ICD-10-CM | POA: Diagnosis not present

## 2023-12-17 DIAGNOSIS — F411 Generalized anxiety disorder: Secondary | ICD-10-CM

## 2023-12-17 DIAGNOSIS — M810 Age-related osteoporosis without current pathological fracture: Secondary | ICD-10-CM

## 2023-12-17 DIAGNOSIS — Z0000 Encounter for general adult medical examination without abnormal findings: Secondary | ICD-10-CM | POA: Diagnosis not present

## 2023-12-17 DIAGNOSIS — E538 Deficiency of other specified B group vitamins: Secondary | ICD-10-CM

## 2023-12-17 DIAGNOSIS — G4700 Insomnia, unspecified: Secondary | ICD-10-CM

## 2023-12-17 LAB — COMPREHENSIVE METABOLIC PANEL WITH GFR
ALT: 10 U/L (ref 0–35)
AST: 16 U/L (ref 0–37)
Albumin: 4.2 g/dL (ref 3.5–5.2)
Alkaline Phosphatase: 62 U/L (ref 39–117)
BUN: 12 mg/dL (ref 6–23)
CO2: 30 meq/L (ref 19–32)
Calcium: 9.2 mg/dL (ref 8.4–10.5)
Chloride: 97 meq/L (ref 96–112)
Creatinine, Ser: 0.61 mg/dL (ref 0.40–1.20)
GFR: 86.92 mL/min (ref >60.00)
Glucose, Bld: 83 mg/dL (ref 70–99)
Potassium: 4.7 meq/L (ref 3.5–5.1)
Sodium: 135 meq/L (ref 135–145)
Total Bilirubin: 0.5 mg/dL (ref 0.2–1.2)
Total Protein: 7 g/dL (ref 6.0–8.3)

## 2023-12-17 LAB — LIPID PANEL
Cholesterol: 140 mg/dL (ref 0–200)
HDL: 51.1 mg/dL (ref >39.00)
LDL Cholesterol: 75 mg/dL (ref 0–99)
NonHDL: 88.4
Total CHOL/HDL Ratio: 3
Triglycerides: 68 mg/dL (ref 0.0–149.0)
VLDL: 13.6 mg/dL (ref 0.0–40.0)

## 2023-12-17 LAB — VITAMIN D 25 HYDROXY (VIT D DEFICIENCY, FRACTURES): VITD: 51.63 ng/mL (ref 30.00–100.00)

## 2023-12-17 LAB — VITAMIN B12: Vitamin B-12: 1195 pg/mL — ABNORMAL HIGH (ref 211–911)

## 2023-12-17 MED ORDER — METOPROLOL TARTRATE 25 MG PO TABS: 25.0000 mg | ORAL_TABLET | Freq: Four times a day (QID) | ORAL | 3 refills | Status: AC | PRN

## 2023-12-17 MED ORDER — BENZONATATE 100 MG PO CAPS: 100.0000 mg | ORAL_CAPSULE | Freq: Two times a day (BID) | ORAL | 0 refills | Status: AC | PRN

## 2023-12-17 MED ORDER — CITALOPRAM HYDROBROMIDE 40 MG PO TABS: 40.0000 mg | ORAL_TABLET | Freq: Every day | ORAL | 0 refills | Status: AC

## 2023-12-17 MED ORDER — ZOLPIDEM TARTRATE 5 MG PO TABS: 2.5000 mg | ORAL_TABLET | Freq: Every evening | ORAL | 0 refills | Status: AC | PRN

## 2023-12-17 NOTE — Patient Instructions (Signed)
 Debrox ear drops 2-3 drops 2-3 times per week  Go back on Xyzal 5 mg at bedtime

## 2023-12-17 NOTE — Progress Notes (Signed)
 Complete physical exam  Patient: Brittany [name]   DOB: [birth year]   78 y.o. Female  MRN: [id number]  Subjective:    Chief Complaint  Patient presents with   Medicare Wellness    Brittany [name] is a 78 y.o. female who presents today for a complete physical exam. She reports consuming a general diet. Eats mostly pasta with veggies, some salads, does eat yogurt, maybe not eating as much protein as she should. Home exercise routine includes walking 1-2 hrs per week. She generally feels well. She reports sleeping well. She does not have additional problems to discuss today.    Most recent fall risk assessment:    12/17/2023    8:45 AM  Fall Risk   Falls in the past year? 0  Number falls in past yr: 0  Injury with Fall? 0  Risk for fall due to : No Fall Risks  Follow up Falls evaluation completed     Most recent depression screenings:    12/17/2023    8:46 AM 10/06/2023   10:30 AM  PHQ 2/9 Scores  PHQ - 2 Score 0 0  PHQ- 9 Score 0 0    Vision:Within last year and Dental: No current dental problems and Receives regular dental care  Patient Active Problem List   Diagnosis Date Noted   Bronchiectasis without complication (HCC) 05/25/2023   Chronic idiopathic urticaria 04/29/2022   OSA (obstructive sleep apnea) 01/09/2021   Status post total left knee replacement 10/11/2020   Primary osteoarthritis of left knee 10/02/2020   Atrial fibrillation (HCC) 05/13/2018   Bloating 04/11/2018   Abnormal EKG 08/23/2017   Malignant neoplasm of overlapping sites of right breast in female, estrogen receptor positive (HCC) 08/17/2016   Hx of colonic polyps 08/11/2016   Iron deficiency anemia 07/28/2016   Blood donor 07/28/2016   Pain due to total right knee replacement 04/06/2016   S/P right TKA 12/31/2015   Baker's cyst, unruptured 09/27/2015   Insomnia 07/31/2015   Onychomycosis 09/25/2014   Urinary frequency 05/21/2014   Mitral valve prolapse 05/08/2014   Mitral regurgitation  05/08/2014   Breast cancer, right breast (HCC) 03/12/2014   Dyslipidemia 02/09/2014   History of radiation therapy    Hiatal hernia    History of chemotherapy    Osteoporosis 03/21/2012   Seasonal and perennial allergic rhinitis 12/17/2011   B12 deficiency 10/09/2009   TOBACCO USE, QUIT 01/31/2009   THYROID  NODULE 12/25/2008   GERD 11/23/2007   VARICOSE VEIN 12/29/2006   Anxiety state 12/23/2006   Depression 12/23/2006   Migraine variant 12/23/2006   Disorder of bone and cartilage 12/23/2006      Patient Care Team: Ozell Heron HERO, MD as PCP - General (Family Medicine) Jeffrie Oneil BROCKS, MD as PCP - Cardiology (Cardiology) Inocencio Soyla Lunger, MD as PCP - Electrophysiology (Cardiology) Shlomo Wilbert SAUNDERS, MD as PCP - Sleep Medicine (Cardiology) Shlomo Wilbert SAUNDERS, MD as Consulting Physician (Sleep Medicine) Liane Sharyne MATSU, [facility] (Inactive) as Pharmacist (Pharmacist) Jerri Kay HERO, MD as Attending Physician (Orthopedic Surgery)   Outpatient Medications Prior to Visit  Medication Sig   acetaminophen  (TYLENOL ) 500 MG tablet Take 500 mg by mouth every 6 (six) hours as needed.   Biotin  5000 MCG TABS Take 5,000 mcg by mouth daily.   Calcium -Magnesium -Zinc (CAL-MAG-ZINC PO) Take 1 tablet by mouth daily. 600 mg   Cholecalciferol  (VITAMIN D ) 50 MCG (2000 UT) tablet Take 2,000 Units by mouth daily.   Cyanocobalamin  (VITAMIN B-12 PO) Take 2,500  mcg by mouth daily.   denosumab  (PROLIA ) 60 MG/ML SOSY injection Inject 60 mg into the skin every 6 (six) months.   diphenhydrAMINE  (BENADRYL ) 25 MG tablet Take 25 mg by mouth every 6 (six) hours as needed for allergies.   Evolocumab  (REPATHA  SURECLICK) 140 MG/ML SOAJ Inject 140 mg into the skin every 14 (fourteen) days.   famotidine  (PEPCID ) 40 MG tablet TAKE 1 TABLET BY MOUTH EVERY DAY   fluticasone  (FLONASE ) 50 MCG/ACT nasal spray SPRAY 2 SPRAYS INTO EACH NOSTRIL EVERY DAY   ipratropium (ATROVENT ) 0.03 % nasal spray Place 2 sprays into both  nostrils 3 (three) times daily as needed for rhinitis.   Magnesium  250 MG CAPS    Omega-3 Fatty Acids (FISH OIL) 1200 MG CAPS Take 1,200 mg by mouth daily.   Probiotic Product (PROBIOTIC DAILY PO) Take 1 tablet by mouth daily.   SUMAtriptan  (IMITREX ) 100 MG tablet Take 1 tablet (100 mg total) by mouth once for 1 dose. Can repeat dose in 2 hours if symptoms persist. Max dose 2 tablets in 24 hours.   [DISCONTINUED] citalopram  (CELEXA ) 40 MG tablet TAKE 1 TABLET BY MOUTH EVERY DAY   [DISCONTINUED] metoprolol  tartrate (LOPRESSOR ) 25 MG tablet Take 1 tablet (25 mg total) by mouth every 6 (six) hours as needed. For A-Fib   [DISCONTINUED] zolpidem  (AMBIEN ) 5 MG tablet TAKE 0.5-1 TABLETS (2.5-5 MG TOTAL) BY MOUTH AT BEDTIME AS NEEDED FOR SLEEP.   [DISCONTINUED] Dextromethorphan-guaiFENesin  (MUCINEX  DM) 30-600 MG TB12 Take 1 tablet by mouth 2 (two) times daily. (Patient not taking: Reported on 12/09/2023)   No facility-administered medications prior to visit.    Review of Systems  HENT:  Negative for hearing loss.   Eyes:  Negative for blurred vision.  Respiratory:  Negative for shortness of breath.   Cardiovascular:  Negative for chest pain.  Gastrointestinal: Negative.   Genitourinary: Negative.   Musculoskeletal:  Negative for back pain.  Neurological:  Negative for headaches.  Psychiatric/Behavioral:  Negative for depression.        Objective:     BP 110/78   Pulse 75   Temp 97.8 F (36.6 C) (Oral)   Ht 5' 6.5 (1.689 m)   Wt 129 lb 8 oz (58.7 kg)   LMP 03/02/1994 (Approximate)   SpO2 99%   BMI 20.59 kg/m    Physical Exam Vitals reviewed.  Constitutional:      Appearance: Normal appearance. She is well-groomed and normal weight.  HENT:     Right Ear: Tympanic membrane and ear canal normal.     Left Ear: Tympanic membrane and ear canal normal.     Mouth/Throat:     Mouth: Mucous membranes are moist.     Pharynx: No posterior oropharyngeal erythema.  Eyes:      Conjunctiva/sclera: Conjunctivae normal.  Neck:     Thyroid : No thyromegaly.  Cardiovascular:     Rate and Rhythm: Normal rate and regular rhythm.     Pulses: Normal pulses.     Heart sounds: S1 normal and S2 normal.  Pulmonary:     Effort: Pulmonary effort is normal.     Breath sounds: Normal breath sounds and air entry.  Abdominal:     General: Abdomen is flat. Bowel sounds are normal.     Palpations: Abdomen is soft.  Musculoskeletal:     Right lower leg: No edema.     Left lower leg: No edema.  Lymphadenopathy:     Cervical: No cervical adenopathy.  Neurological:  Mental Status: She is alert and oriented to person, place, and time. Mental status is at baseline.     Gait: Gait is intact.  Psychiatric:        Mood and Affect: Mood and affect normal.        Speech: Speech normal.        Behavior: Behavior normal.        Judgment: Judgment normal.           Assessment & Plan:    Routine Health Maintenance and Physical Exam  Immunization History  Administered Date(s) Administered   Fluad Quad(high Dose 65+) 11/06/2020   Fluad Trivalent(High Dose 65+) 11/03/2022   INFLUENZA, HIGH DOSE SEASONAL PF 12/20/2012, 11/20/2014, 11/21/2017, 11/07/2019   Influenza Split 12/01/2011   Influenza Whole 12/06/2007, 11/15/2008, 01/06/2010, 11/11/2010   Influenza,inj,Quad PF,6+ Mos 11/30/2013   Influenza,inj,quad, With Preservative 11/02/2016   Influenza-Unspecified 11/27/2015, 10/31/2016, 10/27/2018, 10/17/2023   Moderna Covid-19 Vaccine Bivalent Booster 75yrs & up 02/15/2021, 12/12/2021   Moderna Sars-Covid-2 Vaccination 03/30/2019, 04/27/2019, 01/08/2020, 09/30/2020   PNEUMOCOCCAL CONJUGATE-20 02/10/2021   Pneumococcal Conjugate-13 03/22/2013   Pneumococcal Polysaccharide-23 01/31/2009, 07/31/2015, 06/20/2019   Rabies, IM 03/04/2015, 03/07/2015, 03/11/2015, 03/18/2015   Td 10/09/2009   Tdap 03/04/2015   Zoster Recombinant(Shingrix) 02/13/2017, 07/03/2017   Zoster, Live  04/18/2009    Health Maintenance  Topic Date Due   COVID-19 Vaccine (7 - 2025-26 season) 11/01/2023   Mammogram  03/15/2024   Medicare Annual Wellness (AWV)  03/16/2024   DTaP/Tdap/Td (3 - Td or Tdap) 03/03/2025   Pneumococcal Vaccine: 50+ Years  Completed   Influenza Vaccine  Completed   DEXA SCAN  Completed   Hepatitis C Screening  Completed   Zoster Vaccines- Shingrix  Completed   Meningococcal B Vaccine  Aged Out   Colonoscopy  Discontinued    Discussed health benefits of physical activity, and encouraged her to engage in regular exercise appropriate for her age and condition.  Routine adult health maintenance  Dyslipidemia -     Lipid panel; Future -     Comprehensive metabolic panel with GFR; Future  Paroxysmal atrial fibrillation (HCC) -     Metoprolol  Tartrate; Take 1 tablet (25 mg total) by mouth every 6 (six) hours as needed. For A-Fib  Dispense: 30 tablet; Refill: 3  Osteoporosis, unspecified osteoporosis type, unspecified pathological fracture presence -     VITAMIN D  25 Hydroxy (Vit-D Deficiency, Fractures); Future  B12 deficiency -     Vitamin B12; Future  Anxiety state -     Citalopram  Hydrobromide; Take 1 tablet (40 mg total) by mouth daily.  Dispense: 90 tablet; Refill: 0  Insomnia, unspecified type -     Zolpidem  Tartrate; Take 0.5-1 tablets (2.5-5 mg total) by mouth at bedtime as needed for sleep.  Dispense: 90 tablet; Refill: 0  Post-nasal drip -     Benzonatate ; Take 1 capsule (100 mg total) by mouth 2 (two) times daily as needed for cough.  Dispense: 20 capsule; Refill: 0  General physical exam findings are normal today. I reviewed the patient's preventative testing, immunizations, and lifestyle habits. I made appropriate recommendations and placed orders for the appropriate tests and/or vaccinations. I counseled the patient on the CDC's recommendations for healthy exercise and diet. I counseled the patient on healthy sleep habits and stress  management. Handouts to reinforce the counseling were given at the conclusion of the visit.    Return in about 6 months (around 06/16/2024).     Heron CHRISTELLA Sharper, MD

## 2023-12-18 ENCOUNTER — Other Ambulatory Visit: Payer: Self-pay | Admitting: Family Medicine

## 2023-12-22 ENCOUNTER — Ambulatory Visit: Payer: Self-pay | Admitting: Family Medicine

## 2023-12-22 ENCOUNTER — Encounter: Payer: Self-pay | Admitting: Family Medicine

## 2023-12-28 ENCOUNTER — Encounter: Payer: Self-pay | Admitting: Cardiology

## 2023-12-28 DIAGNOSIS — E785 Hyperlipidemia, unspecified: Secondary | ICD-10-CM

## 2023-12-28 MED ORDER — REPATHA SURECLICK 140 MG/ML ~~LOC~~ SOAJ: 1.0000 mL | SUBCUTANEOUS | 1 refills | Status: AC

## 2024-01-02 DIAGNOSIS — C50911 Malignant neoplasm of unspecified site of right female breast: Secondary | ICD-10-CM | POA: Diagnosis not present

## 2024-01-03 ENCOUNTER — Encounter: Payer: Self-pay | Admitting: Radiology

## 2024-01-05 ENCOUNTER — Telehealth: Payer: Self-pay

## 2024-01-05 NOTE — Telephone Encounter (Signed)
 Called pt per MD to advise [facility] testing was negative/not detected. Pt verbalized understanding of results and knows [facility] will be in touch to schedule 6 mo repeat lab.

## 2024-01-07 ENCOUNTER — Encounter: Payer: Self-pay | Admitting: Hematology and Oncology

## 2024-01-14 NOTE — Progress Notes (Cosign Needed)
 Assessment/Plan:     Brittany [name] is a very pleasant 78 y.o. year old RH female with a history of hypertension, hyperlipidemia, PAF, B12 deficiency, insomnia, anxiety, right breast cancer on surveillance seen today for evaluation of memory loss. MoCA today is 23/30. There may be a component of attention and anxiety, comprehension and distractibility affecting her memory, for example as she left she was instructed to wait for our nurse to write the orders to the lab the patient left the office without his orders.  The vascular component may be present as well, will rule out.  She reports that she is also concerned about decreased ability to multitask which affects her mood.  Workup is in progress  Memory Impairment of unclear etiology   MRI brain without contrast to assess for underlying structural abnormality and assess vascular load  Neurocognitive testing to further evaluate cognitive concerns and determine other underlying cause of memory changes, including potential contribution from sleep, anxiety, attention, or depression among others Pending the results, may entertain antidementia medication if indicated.   Discussed with PCP changing Ambien  to another agent as this can affect memory Check TSH Recommend good control of cardiovascular risk factors.   Continue to control mood as per PCP Folllow up pending on the results of the above  Subjective:    The patient is here alone    How long did patient have memory difficulties?  For about 3 years. I feel my brain has a block on it, a gap between what I want to say and what comes out of my mouth.  Patient reports some difficulty remembering new information, recent conversations, names, topic of a conversation. Has difficulty multitasking. LTM is good. Has to write a list and notes to herself. Likes to read books, knit, does not like crossword puzzles or game shows.  repeats oneself?  Denies. Disoriented when walking into a room? Denies     Leaving objects in unusual places?  Denies.   Wandering behavior? Denies.   Any personality changes, or depression, anxiety? My daughter is alcoholic and she is a mess, so that gives me anxiety. Tried therapy but did not work for me  Hallucinations or paranoia? Denies.   Seizures? Denies.    Any sleep changes?  Sleeps  with Ambien . Denies frequent nightmares or dream reenactment, other REM behavior or sleepwalking   Sleep apnea?  Endorsed, uses [facility] the last 2 years.. Any hygiene concerns?  Denies.   Independent of bathing and dressing? Endorsed  Does the patient need help with medications?  Patient is in charge   Who is in charge of the finances?  is in charge     Any changes in appetite?   Denies.     Patient have trouble swallowing?  Denies.   Does the patient cook? Yes, denies forgetting common recipes or kitchen accidents   Any headaches?  Denies.   Chronic pain? Denies.   Ambulates with difficulty?  She has chronic arthritic changes, has undergone multiple orthopedic surgeries including 3 knee surgeries and foot surgery which affect her balance and mobility. Does not participate PT.  Recent falls or head injuries? Denies.     Vision changes?  Denies any new issues.   Any strokelike symptoms? Denies.   Any tremors? Denies.   Any anosmia? Denies.   Any incontinence of urine? Denies.   Any bowel dysfunction? Intermittent constipation   Patient lives with her husband   History of heavy alcohol intake? Denies.  History of heavy tobacco use? Denies.   Family history of dementia?  Mother with dementia after hip fracture. Does patient drive?  yes, denies getting lost but sometimes cannot remember where she is going despite having a purpose to go somewhere, more common recently.    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  Can take Tylenol .   Oxycodone     Tramadol  Other (See Comments)    Does not help pain.- patient reported.    Current Outpatient Medications  Medication Instructions   acetaminophen  (TYLENOL ) 500 mg, Every 6 hours PRN   benzonatate  (TESSALON ) 100 mg, Oral, 2 times daily PRN   Biotin  5,000 mcg, Daily   Calcium -Magnesium -Zinc (CAL-MAG-ZINC PO) 1 tablet, Daily   citalopram  (CELEXA ) 40 mg, Oral, Daily   Cyanocobalamin  (VITAMIN B-12 PO) 2,500 mcg, Daily   denosumab  (PROLIA ) 60 mg, Every 6 months   diphenhydrAMINE  (BENADRYL ) 25 mg, Every 6 hours PRN   famotidine  (PEPCID ) 40 mg, Oral, Daily   Fish Oil 1,200 mg, Daily   fluticasone  (FLONASE ) 50 MCG/ACT nasal spray SPRAY 2 SPRAYS INTO EACH NOSTRIL EVERY DAY   ipratropium (ATROVENT ) 0.03 % nasal spray 2 sprays, Each Nare, 3 times daily PRN   Magnesium  250 MG CAPS    metoprolol  tartrate (LOPRESSOR ) 25 mg, Oral, Every 6 hours PRN, For A-Fib   Probiotic Product (PROBIOTIC DAILY PO) 1 tablet, Daily   Repatha  SureClick 140 mg, Subcutaneous, Every 14 days   SUMAtriptan  (IMITREX ) 100 mg, Oral,  Once, Can repeat dose in 2 hours if symptoms persist. Max dose 2 tablets in 24 hours.   Vitamin D  2,000 Units, Daily   zolpidem  (AMBIEN ) 2.5-5 mg, Oral, At bedtime PRN     VITALS:   Vitals:   01/18/24 0935  BP: 120/79  Pulse: 79  Resp: 20  SpO2: 98%  Weight: 130 lb (59 kg)  Height: 5' 7 (1.702 m)     Neurological Exam      01/18/2024   12:00 PM  Montreal Cognitive Assessment   Visuospatial/ Executive (0/5) 2  Naming (0/3) 3  Attention: Read list of digits (0/2) 2  Attention: Read list of letters (0/1) 1  Attention: Serial 7 subtraction starting at 100 (0/3) 3  Language: Repeat phrase (0/2) 2  Language : Fluency (0/1) 0   Abstraction (0/2) 1  Delayed Recall (0/5) 3  Orientation (0/6) 6  Total 23  Adjusted Score (based on education) 23       02/18/2018   10:27 AM 02/12/2017   11:03 AM  MMSE - Mini Mental State Exam  Orientation to time 5 5   Orientation to Place 5 5   Registration 3 3   Attention/ Calculation 5 5   Recall 3 3   Language- name 2 objects 2 2   Language- repeat 1 1  Language- follow 3 step command 3 3   Language- read & follow direction 1 1   Write a sentence 1 1   Copy design 1 1   Total score 30 30      Data saved with a previous flowsheet row definition       Orientation:  Alert and oriented to person, place and to time. No aphasia or dysarthria. Fund of knowledge is appropriate. Recent and remote memory impaired.  Attention and concentration are reduced.  Able to name objects and repeat phrases. Delayed recall 3/5  cranial nerves: There is good facial symmetry. Extraocular muscles are intact and visual fields are full to confrontational testing. Speech is fluent and clear. No tongue deviation. Hearing is intact to conversational tone. Tone: Tone is good throughout. Sensation: Sensation is intact to light touch.  Vibration is intact at the bilateral big toe.  Coordination: The patient has no difficulty with RAM's or FNF bilaterally. Normal finger to nose  Motor: Strength is 5/5 in the bilateral upper and lower extremities. There is no pronator drift. There are no fasciculations noted. DTR's: Deep tendon reflexes are 2/4 bilaterally. Gait and Station: The patient is able to ambulate without difficulty. Gait is cautious and narrow. Stride length is normal.       Thank you for allowing us  the opportunity to participate in the care of this nice patient. Please do not hesitate to contact us  for any questions or concerns.   Total time spent on today's visit was 60 minutes dedicated to this patient today, preparing to see patient, examining the patient, ordering tests and/or  medications and counseling the patient, documenting clinical information in the EHR or other health record, independently interpreting results and communicating results to the patient/family, discussing treatment and goals, answering patient's questions and coordinating care.  Cc:  Ozell Heron HERO, MD  Camie Sevin 01/18/2024 12:37 PM

## 2024-01-18 ENCOUNTER — Encounter: Payer: Self-pay | Admitting: Physician Assistant

## 2024-01-18 ENCOUNTER — Ambulatory Visit

## 2024-01-18 ENCOUNTER — Ambulatory Visit: Admitting: Physician Assistant

## 2024-01-18 VITALS — BP 120/79 | HR 79 | Resp 20 | Ht 67.0 in | Wt 130.0 lb

## 2024-01-18 DIAGNOSIS — R413 Other amnesia: Secondary | ICD-10-CM

## 2024-01-18 LAB — TSH: TSH: 1.52 m[IU]/L (ref 0.40–4.50)

## 2024-01-18 NOTE — Patient Instructions (Addendum)
 Neuropsych evaluation  Lab today [address] MRI brain at [facility] [phone]

## 2024-01-19 ENCOUNTER — Ambulatory Visit: Payer: Self-pay | Admitting: Physician Assistant

## 2024-01-19 ENCOUNTER — Encounter: Payer: Self-pay | Admitting: Family Medicine

## 2024-01-19 DIAGNOSIS — G4700 Insomnia, unspecified: Secondary | ICD-10-CM

## 2024-01-25 ENCOUNTER — Other Ambulatory Visit: Payer: Self-pay | Admitting: Family Medicine

## 2024-01-25 DIAGNOSIS — K219 Gastro-esophageal reflux disease without esophagitis: Secondary | ICD-10-CM

## 2024-01-25 MED ORDER — TRAZODONE HCL 50 MG PO TABS: 25.0000 mg | ORAL_TABLET | Freq: Every evening | ORAL | 3 refills | Status: DC | PRN

## 2024-02-04 ENCOUNTER — Ambulatory Visit (HOSPITAL_COMMUNITY)
Admission: RE | Admit: 2024-02-04 | Discharge: 2024-02-04 | Disposition: A | Source: Ambulatory Visit | Attending: Physician Assistant | Admitting: Physician Assistant

## 2024-02-04 DIAGNOSIS — R413 Other amnesia: Secondary | ICD-10-CM | POA: Diagnosis not present

## 2024-02-09 DIAGNOSIS — D225 Melanocytic nevi of trunk: Secondary | ICD-10-CM | POA: Diagnosis not present

## 2024-02-09 DIAGNOSIS — L814 Other melanin hyperpigmentation: Secondary | ICD-10-CM | POA: Diagnosis not present

## 2024-02-09 DIAGNOSIS — L821 Other seborrheic keratosis: Secondary | ICD-10-CM | POA: Diagnosis not present

## 2024-02-09 DIAGNOSIS — Z85828 Personal history of other malignant neoplasm of skin: Secondary | ICD-10-CM | POA: Diagnosis not present

## 2024-02-09 DIAGNOSIS — B351 Tinea unguium: Secondary | ICD-10-CM | POA: Diagnosis not present

## 2024-02-10 ENCOUNTER — Ambulatory Visit: Payer: Self-pay

## 2024-02-10 ENCOUNTER — Ambulatory Visit: Admitting: Psychology

## 2024-02-10 DIAGNOSIS — R419 Unspecified symptoms and signs involving cognitive functions and awareness: Secondary | ICD-10-CM

## 2024-02-10 DIAGNOSIS — R4189 Other symptoms and signs involving cognitive functions and awareness: Secondary | ICD-10-CM

## 2024-02-10 NOTE — Progress Notes (Signed)
° °  Psychometrician Note   Cognitive testing was administered to Brittany [name] by Evalene Pizza, B.S. (psychometrist) under the supervision of Dr. Renda Beckwith, Psy.D., licensed psychologist on 02/10/2024. Brittany [name] did not appear overtly distressed by the testing session per behavioral observation or responses across self-report questionnaires. Rest breaks were offered.   The battery of tests administered was selected by Dr. Renda Beckwith, Psy.D. with consideration to Brittany [name]'s current level of functioning, the nature of her symptoms, emotional and behavioral responses during interview, level of literacy, observed level of motivation/effort, and the nature of the referral question. This battery was communicated to the psychometrist. Communication between Dr. Renda Beckwith, Psy.D. and the psychometrist was ongoing throughout the evaluation and Dr. Renda Beckwith, Psy.D. was immediately accessible at all times. Dr. Renda Beckwith, Psy.D. provided supervision to the psychometrist on the date of this service to the extent necessary to assure the quality of all services provided.    Brittany [name] will return within approximately 1-2 weeks for an interactive feedback session with Dr. Beckwith at which time her test performances, clinical impressions, and treatment recommendations will be reviewed in detail. Brittany [name] understands she can contact our office should she require our assistance before this time.  A total of 129 minutes of billable time were spent face-to-face with Brittany [name] by the psychometrist. This includes both test administration and scoring time. Billing for these services is reflected in the clinical report generated by Dr. Renda Beckwith, Psy.D.  This note reflects time spent with the psychometrician and does not include test scores or any clinical interpretations made by Dr. Beckwith. The full report will follow in a separate note.

## 2024-02-11 ENCOUNTER — Encounter: Payer: Self-pay | Admitting: Podiatry

## 2024-02-11 ENCOUNTER — Ambulatory Visit: Admitting: Podiatry

## 2024-02-11 DIAGNOSIS — B351 Tinea unguium: Secondary | ICD-10-CM | POA: Diagnosis not present

## 2024-02-11 MED ORDER — TERBINAFINE HCL 250 MG PO TABS: ORAL_TABLET | ORAL | 0 refills | Status: AC

## 2024-02-11 NOTE — Progress Notes (Signed)
 NEUROPSYCHOLOGICAL EVALUATION [place]. [facility]  [place] Department of Neurology  Date of Evaluation: 02/10/2024  REASON FOR REFERRAL   Brittany [name] is a 78 year old, left-handed, White female with 14 years of formal education (no degree, normed at 13 years accordingly). She was referred for neuropsychological evaluation by Camie Sevin, PA-C, to assess current neurocognitive functioning, document potential cognitive deficits, and assist with treatment planning. This is her first neuropsychological evaluation.  SUMMARY OF RESULTS   Premorbid cognitive abilities are estimated to be in the average range based on word reading and sociodemographic factors. Relative to this baseline, current performance was generally within age-related expectations, with a few isolated weaknesses.  Specifically, she demonstrated some impulsivity on a task of alternating attention, resulting in four errors. She performed adequately on a problem-solving task but lost track of her sorting strategy twice. Semantic fluency was also below expectations.   All other measures of working memory, processing speed, executive functioning, language, visuospatial abilities, and learning/memory were intact.  On self-report questionnaires, she endorsed moderate symptoms of anxiety but did not report elevated depressive symptoms.  DIAGNOSTIC IMPRESSION   Results of the current evaluation indicated normal cognitive functioning, although some impulsivity and minor inefficiencies in attention to detail were observed. There is no evidence of a neurocognitive disorder at this time. Overall, her profile reflects healthy cognitive aging with well-preserved functional abilities. Subjective cognitive concerns are likely multifactorial, potentially related to normal aging, undiagnosed attention difficulties (with longstanding hyperactivity), intermittent stressors, and mild cerebrovascular changes. At present, there is no  evidence of an emerging neurodegenerative process, and although the future can never be predicted with certainty, current findings are reassuring.  Results provide a baseline for future comparison should reevaluation become necessary.  ICD-10 Codes: R41.9 Cognitive concerns with normal neuropsychological exam  RECOMMENDATIONS   In consultation with your doctor, schedule cognitive reevaluation on an as-needed basis to assess for cognitive decline and update treatment recommendations. Reevaluation should occur during a period of medical and affective stability.  Prioritize physical health through diet, exercise, and sleep. Regular physical activity supports cardiovascular health, improves mood, and helps preserve mobility and independence. Aim for at least 150 minutes of moderate aerobic exercise per week (e.g., brisk walking, swimming, gardening). A brain-healthy diet such as the Mediterranean or MIND diet is rich in fruits, vegetables, whole grains, healthy fats, and lean proteins, and has been associated with reduced risk of cognitive decline. Additionally, getting adequate, quality sleep and managing chronic conditions with the help of healthcare providers are essential components of healthy aging.  Continue to stay socially and mentally engaged. Maintaining strong social connections and regularly stimulating your brain can help protect against cognitive decline. This includes staying connected with friends and family, volunteering, or participating in community groups. Mentally engaging activities--such as reading, doing puzzles, playing strategy games, or learning a new language or musical instrument--promote brain plasticity. If you are interested in activities to support cognitive engagement, this site offers a variety of apps and games organized by difficulty level:  https://www.[facility].org/get-to-know-barrow/centers-programs/neurorehabilitation-center/neuro-rehab-apps-and-games/  Consider  implementing compensatory strategies to maximize independence and maintain daily functioning. Examples include:  Adhere to routine. Compensatory strategies work best when they are used consistently. Use a planner, calendar, or white board that has the schedule and important events for the day clearly listed to reference and cross off when tasks are complete.  Ask for written information, especially if it is new or unfamiliar (e.g., information provided at a doctor's appointment).  Create an organized environment. Keep items  that can be easily misplaced in a sensible location and get into the habit of always returning the items to those places. Pay attention and reduce distractions. Make a point of focusing attention on information you want to remember. One-on-one interaction is more likely to facilitate attention and minimize distraction. Make eye contact and repeat the information out loud after you hear it. Reduce interruptions or distractions especially when attempting to learn new information.  Create associations. When learning something new, think about and understand the information. Explain it in your own words or try to associate it with something you already know. Take notes to help remember important details. Evaluate goals and plan accordingly. When confronted by many different tasks, begin by making a list that prioritizes each task and estimates the time it will take to complete. Break down complicated tasks into smaller, more manageable steps. Focus on one task at a time and complete each task before starting another. Avoid multitasking.  DISPOSITION   Patient will follow up with the referring provider, Ms. Wertman. No follow-up neuropsychological testing was scheduled at this time. Please feel free to refer the patient for repeated evaluation if she shows a significant change in neurocognitive status. She will be provided verbal feedback in approximately one week regarding the findings  and impression during this visit.  The remainder of the report includes the details of the patient's background and a table of results from the current evaluation, which support the summary and recommendations described above.  BACKGROUND   History of Presenting Illness: The following information was obtained from a review of medical records and an interview with the patient. Briefly, the patient was evaluated by Camie Sevin, PA-C, at [facility] on 01/18/2024 for cognitive concerns over the past three years. She explained that she feels as though she has a mental block and that there is a gap between what she intends to say and what actually comes out. MoCA = 23/30. She was referred for neuropsychological evaluation accordingly.  Cognitive Functioning: During todays appointment, the patient reported cognitive difficulties that have become increasingly concerning over the past year to year and a half. Consistent with what she previously described to neurology, she reported experiencing mental blocks. For example, on one occasion she needed to go to [employer] but walked into [employer] instead; that same week, she needed to go to [employer] but accidentally went into [employer]. She does not feel she has attention problems, but she has noticed that her processing speed may be somewhat slower. She also reports daily forgetfulness, though not to the extent of forgetting conversations or repeating herself, along with some word-finding difficulties. While she has not become lost while driving, she often uses GPS because she does not fully trust herself to navigate, as there have been moments when she suddenly questions where she is headed. She did not report any difficulties with executive functioning.  Physical Functioning: Patient denied difficulties with sleep initiation and maintenance since taking a sleep aid; she was previously prescribed Ambien  but discontinued it after being started on  trazodone . She continues to manage her sleep apnea with the [facility] device. Appetite remains stable, with no reported changes in smell or taste. Vision and hearing are also stable. She reported decreased balance but denied recent falls. She also denied any tremors.  Emotional Functioning: Patient described some personal and family-related stressors but noted that, despite these, she has maintained a relatively stable mood. She denied any suicidal ideation. She continues to engage in many activities  she enjoys, such as reading, knitting, doing crosswords, and watching game shows.  Neuroimaging: MRI of the brain (02/04/2024) documented several foci of T2 hyperintensity in the cerebral white matter, considered mild.  Other Relevant Medical History: Remarkable for atrial fibrillation, mitral valve prolapse, mitral regurgitation, dyslipidemia, obstructive sleep apnea, gastroesophageal reflux disease, and history of breast cancer. Please refer to the medical record for a more comprehensive problem list. No history of stroke, CNS infection, head injury, or seizure was reported.  Current Medications: Per patient, acetaminophen , biotin , calcium -magnesium -zinc, citalopram , denosumab , diphenhydramine , evolocumab , famotidine , fluticasone , ipratropium, magnesium , metoprolol , omega-3 fatty acids, probiotic, sumatriptan , trazodone , vitamin B12, and vitamin D3.   Functional Status: Patient independently performs all basic and instrumental (e.g., driving, finances, medications) activities of daily living without difficulty. She denied any problems operating household appliances.  Family Neurological History: Remarkable for late-life dementia status post surgery in the patient's mother.  Psychiatric History: Patient denied a history of depression, anxiety, prior mental health treatment, suicidal ideation, hallucinations, and psychiatric hospitalizations. She reported she has been on her current mood medication for many  years and was told to continue it because she was hyper and becoming increasingly hyper with age.  Substance Use History: Patient denied current use of alcohol, nicotine, marijuana, and other illicit substances.  Social and Developmental History: Patient was born in [place], [place]. History of perinatal complications and developmental delays was not reported. She is married and lives with her husband. They have three children.   Educational and Occupational History: No history of childhood learning disability, special education services, or grade retention was reported. Patient reported being hyperactive as a child and suspects she may have experienced attention difficulties. 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 She graduated from high school on time and attended two years of college but did not obtain a degree. 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  BEHAVIORAL OBSERVATIONS   Patient arrived on time and was unaccompanied. She ambulated independently and without gait disturbance. She was alert and fully oriented. She was appropriately groomed and dressed for the setting. No significant sensory or motor abnormalities were observed. Vision (with glasses) and hearing were adequate for testing purposes. Speech was of normal rate, prosody, and volume. No conversational word-finding difficulties, paraphasic errors, or dysarthria were observed. Comprehension was conversationally intact. Thought processes were linear, logical, and coherent. Thought content was organized and devoid of delusions. Insight appeared appropriate. Affect was even and congruent with euthymic mood. She was cooperative and appeared to give adequate effort during testing, including on standalone and embedded measures of performance validity. Results are thought to accurately reflect her cognitive functioning at this time.  NEUROPSYCHOLOGICAL TESTING RESULTS   Tests Administered: Animal  Naming Test; Brief Visuospatial Memory Test-Revised (BVMT-R) - Form 1; Controlled Oral Word Association Test (COWAT): FAS; Geriatric Anxiety Scale-10 Item (GAS-10); Geriatric Depression Scale Short Form (GDS-SF); Hopkins Verbal Learning Test-Revised (HVLT-R) - From 1; Neuropsychological Assessment Battery (NAB) - Subtest(s): Naming Form 1; Repeatable Battery for the Assessment of Neuropsychological Status Update (RBANS Update) Form A - Subtest(s): Line Orientation; Standalone performance validity test (PVT); Test of Premorbid Functioning (TOPF); Trail Making Test (TMT); Wechsler Adult Intelligence Scale Fifth Edition (WAIS-5) - Subtest(s): Similarities, Clinical Cytogeneticist, Digits Forward, Digit Sequencing, Coding, Symbol Search, Digits Backward; Wechsler Memory Scale Fourth Edition (WMS-IV) - Subtest(s): Logical Memory (LM); and [place]  Card Sorting Test 64 Card Version (WCST-64).  Test results are provided in the table below. Whenever possible, the  patient's scores were compared against age-, sex-, and education-corrected normative samples. Interpretive descriptions are based on the AACN consensus conference statement on uniform labeling (Guilmette et al., 2020).  PREMORBID FUNCTIONING RAW  RANGE  TOPF 34 StdS=96 Average  ATTENTION & WORKING MEMORY RAW  RANGE  WAIS-5 Digits Forward -- ss=9 Average  WAIS-5 Digits Backward -- ss=8 Average  WAIS-5 Digit Sequencing -- ss=9 Average  PROCESSING SPEED RAW  RANGE  Trails A 43''0e T=45 Average  WAIS-5 Coding  -- ss=11 Average  WAIS-5 Symbol Search -- ss=8 Average  EXECUTIVE FUNCTION RAW  RANGE  Trails B 177''4e T=36 Below Average  WAIS-5 Similarities -- ss=10 Average  COWAT Letter Fluency 11+10+12 T=42 Low Average  WCST-64 Total Errors 19 T=56 Average  WCST-64 Perseverative Errors 11 T=55 Average  WCST-64 Nonperseverative Errors 8 T=51 Average  WCST-64 Categories Completed 2 >16%ile WNL  WCSR-64 FMS 2 -- --  LANGUAGE RAW  RANGE  COWAT Letter Fluency  11+10+12 T=42 Low Average  Animal Naming Test 10 T=28 Exceptionally Low  NAB Naming Test 28/31 T=42 WNL  VISUOSPATIAL RAW  RANGE  RBANS Line Orientation -- 51-75%ile Average  BVMT-R Copy Trial 12/12 -- WNL  VERBAL LEARNING & MEMORY RAW  RANGE  HVLT-R Learning Trials (5+7+9)/36 T=46 Average  HVLT-R Delayed Recall 6/12 T=42 Low Average  HVLT-R Recognition Hits 11 -- --  HVLT-R Recognition False Positives 1 -- --  HVLT-R Discrimination Index 10 T=48 Average  WMS-IV LM-I  (8+12+10)/53 ss=10 Average  WMS-IV LM-II  (6+7)/39 ss=9 Average  WMS-IV LM Recognition  (8+14)/23 >75%ile High Average to Exceptionally High  VISUAL LEARNING & MEMORY RAW  RANGE  BVMT-R Total Recall (1+5+7)/36 T=38 Low Average  BVMT-R Delayed Recall 6/12 T=43 Average  BVMT-R Percent Retained 86 >16%ile WNL  BVMT-R Recognition Hits 6 >16%ile WNL  BVMT-R Recognition False Alarms 0 >16%ile WNL  BVMT-R Recognition Discrimination Index 6 >16%ile WNL  QUESTIONNAIRES RAW  RANGE  GDS-SF 1 -- Minimal  GAS-10 10 -- Moderate  *Note: ss = scaled score; StdS = standard score; T = t-score; C/S = corrected raw score; WNL = within normal limits; BNL= below normal limits; D/C = discontinued. Scores from skewed distributions are typically interpreted as WNL (>=16th %ile) or BNL (<16th %ile).   INFORMED CONSENT   Patient was provided with a verbal description of the nature and purpose of the neuropsychological evaluation. Also reviewed were the foreseeable risks and/or discomforts and benefits of the procedure, limits of confidentiality, and mandatory reporting requirements of this provider. Patient was given the opportunity to have their questions answered. Oral consent to participate was provided by the patient.   This report was prepared as part of a clinical evaluation and is not intended for forensic use.  SERVICE   This evaluation was conducted by Renda Beckwith, Psy.D. Total professional time includes record review, collection of  clinical data, integration of relevant medical history, test selection, interpretation of findings, and report preparation (180 minutes). A technician, Evalene Pizza, B.S., provided testing and scoring assistance (129 minutes). inefficiencies  Neuropsychological Dance Movement Psychotherapist (Professional): [phone] x 1 Neuropsychological Testing Evaluation Services (Professional): [phone] x 2 Neuropsychological Test Administration and Scoring Radiographer, Therapeutic): [phone] x 1 Neuropsychological Test Administration and Scoring (Technician): [phone] x 3  This report was generated using voice recognition software. While this document has been carefully reviewed, transcription errors may be present. I apologize in advance for any inconvenience. Please contact me if further clarification is needed.  Renda Beckwith, Psy.D.             Neuropsychologist

## 2024-02-13 NOTE — Progress Notes (Signed)
 Subjective:   Patient ID: Brittany [name], female   DOB: 78 y.o.   MRN: [id number]   HPI Patient presents stating that she has developed discoloration left big toenail and states that it is concerning for mildly painful but more the appearance   ROS      Objective:  Physical Exam  Neurovascular status was found to be intact muscle strength adequate range of motion adequate patient has discoloration left hallux mostly lateral side affecting the midportion of the nail also no current detachment     Assessment:  Mycotic nail infection left feet.  Affecting the nailbed with previous history of this with previous treatment     Plan:  H&P done discussed condition at great length I do think we could consider treatment for this but I would not want to be aggressive.  She agrees and at this point I went ahead and I did start her on oral antifungal therapy pulsed treatment with education on how to take this properly.  I then schedule her for laser therapy approximately 3-4 laser treatments with instructions and we will see results and decide on anything else that might be appropriate

## 2024-02-14 ENCOUNTER — Telehealth: Payer: Self-pay

## 2024-02-14 NOTE — Telephone Encounter (Signed)
 Patient call requesting clarification on Lamisil  prescription. Instructions says take one pill daily for 7 days and repeat 3 7 days treatment.

## 2024-02-14 NOTE — Telephone Encounter (Signed)
 Correct but wait a month in between

## 2024-02-15 NOTE — Telephone Encounter (Signed)
 Patient called needing clarification on directions for Terbinafine .

## 2024-02-16 ENCOUNTER — Other Ambulatory Visit: Payer: Self-pay | Admitting: Family Medicine

## 2024-02-16 DIAGNOSIS — G4700 Insomnia, unspecified: Secondary | ICD-10-CM

## 2024-02-17 ENCOUNTER — Ambulatory Visit: Admitting: Psychology

## 2024-02-17 DIAGNOSIS — R419 Unspecified symptoms and signs involving cognitive functions and awareness: Secondary | ICD-10-CM

## 2024-02-17 NOTE — Telephone Encounter (Signed)
 One pill a day for 7 days and wait a month and then repeat

## 2024-02-17 NOTE — Progress Notes (Signed)
° °  NEUROPSYCHOLOGY FEEDBACK SESSION [place]. [facility]  [place] Department of Neurology  Date of Feedback Session: 02/17/2024  REASON FOR REFERRAL   Brittany [name] is a 78 year old, left-handed, White female with 14 years of formal education (no degree, normed at 13 years accordingly). She was referred for neuropsychological evaluation by Camie Sevin, PA-C, to assess current neurocognitive functioning, document potential cognitive deficits, and assist with treatment planning. This is her first neuropsychological evaluation.  FEEDBACK   Patient completed a comprehensive neuropsychological evaluation on 02/10/2024. Please refer to that encounter for the full report and recommendations. Briefly, results indicated normal cognitive functioning, although some impulsivity and minor inefficiencies in attention to detail were observed. There is no evidence of a neurocognitive disorder at this time. Subjective cognitive concerns are likely multifactorial, potentially related to normal aging, undiagnosed attention difficulties (with longstanding hyperactivity), intermittent stressors, and mild cerebrovascular changes. At present, there is no evidence of an emerging neurodegenerative process.   Today, the patient was unaccompanied. She was provided verbal feedback regarding the findings and impression during this visit, and her questions were answered. A copy of the report was provided at the conclusion of the visit.  DISPOSITION   No follow-up neuropsychological testing was scheduled at this time. Please feel free to refer the patient for repeated evaluation if she shows a significant change in neurocognitive status.  SERVICE   This feedback session was conducted by Renda Beckwith, Psy.D. One unit of [phone] (35 minutes) was billed for Dr. Beckwith' time spent in preparing, conducting, and documenting the current feedback session.  This report was generated using voice recognition software. While  this document has been carefully reviewed, transcription errors may be present. I apologize in advance for any inconvenience. Please contact me if further clarification is needed.

## 2024-03-05 ENCOUNTER — Other Ambulatory Visit: Payer: Self-pay | Admitting: Podiatry

## 2024-03-15 ENCOUNTER — Encounter: Payer: Self-pay | Admitting: Lab

## 2024-03-15 NOTE — Telephone Encounter (Signed)
Does not need more

## 2024-03-17 LAB — HM MAMMOGRAPHY

## 2024-03-20 ENCOUNTER — Ambulatory Visit: Payer: Self-pay | Admitting: Family Medicine

## 2024-03-20 ENCOUNTER — Encounter: Payer: Self-pay | Admitting: Family Medicine

## 2024-03-22 ENCOUNTER — Ambulatory Visit: Payer: PPO

## 2024-03-22 VITALS — Ht 67.0 in | Wt 130.0 lb

## 2024-03-22 DIAGNOSIS — Z0000 Encounter for general adult medical examination without abnormal findings: Secondary | ICD-10-CM | POA: Diagnosis not present

## 2024-03-22 NOTE — Patient Instructions (Addendum)
 Brittany [name],  Thank you for taking the time for your Medicare Wellness Visit. I appreciate your continued commitment to your health goals. Please review the care plan we discussed, and feel free to reach out if I can assist you further.  Please note that Annual Wellness Visits do not include a physical exam. Some assessments may be limited, especially if the visit was conducted virtually. If needed, we may recommend an in-person follow-up with your provider.  Ongoing Care Seeing your primary care provider every 3 to 6 months helps us  monitor your health and provide consistent, personalized care.   Referrals If a referral was made during today's visit and you haven't received any updates within two weeks, please contact the referred provider directly to check on the status.  Recommended Screenings:  Health Maintenance  Topic Date Due   COVID-19 Vaccine (7 - 2025-26 season) 11/01/2023   DTaP/Tdap/Td vaccine (3 - Td or Tdap) 03/03/2025   Breast Cancer Screening  03/17/2025   Medicare Annual Wellness Visit  03/22/2025   Pneumococcal Vaccine for age over 67  Completed   Flu Shot  Completed   Osteoporosis screening with Bone Density Scan  Completed   Hepatitis C Screening  Completed   Zoster (Shingles) Vaccine  Completed   Meningitis B Vaccine  Aged Out   Colon Cancer Screening  Discontinued       03/22/2024    8:19 AM  Advanced Directives  Does Patient Have a Medical Advance Directive? Yes  Type of Estate Agent of [place];Living will  Does patient want to make changes to medical advance directive? No - Patient declined  Copy of Healthcare Power of Attorney in Chart? No - copy requested    Vision: Annual vision screenings are recommended for early detection of glaucoma, cataracts, and diabetic retinopathy. These exams can also reveal signs of chronic conditions such as diabetes and high blood pressure.  Dental: Annual dental screenings help detect early signs of  oral cancer, gum disease, and other conditions linked to overall health, including heart disease and diabetes.  Please see the attached documents for additional preventive care recommendations.

## 2024-03-22 NOTE — Progress Notes (Signed)
 "  Chief Complaint  Patient presents with   Medicare Wellness     Subjective:   Brittany [name] is a 79 y.o. female who presents for a Medicare Annual Wellness Visit.  Visit info / Clinical Intake: Medicare Wellness Visit Type:: Subsequent Annual Wellness Visit Persons participating in visit and providing information:: patient Medicare Wellness Visit Mode:: Telephone If telephone:: video declined Since this visit was completed virtually, some vitals may be partially provided or unavailable. Missing vitals are due to the limitations of the virtual format.: Documented vitals are patient reported If Telephone or Video please confirm:: I connected with patient using audio/video enable telemedicine. I verified patient identity with two identifiers, discussed telehealth limitations, and patient agreed to proceed. Patient Location:: Home Provider Location:: Office Interpreter Needed?: No Pre-visit prep was completed: yes AWV questionnaire completed by patient prior to visit?: yes Date:: 03/18/24 Living arrangements:: lives with spouse/significant other Patient's Overall Health Status Rating: very good Typical amount of pain: none Does pain affect daily life?: no Are you currently prescribed opioids?: no  Dietary Habits and Nutritional Risks How many meals a day?: 2 Eats fruit and vegetables daily?: yes Most meals are obtained by: preparing own meals Diabetic:: no  Functional Status Activities of Daily Living (to include ambulation/medication): Independent Ambulation: Independent with device- listed below Home Assistive Devices/Equipment: Eyeglasses Medication Administration: Independent Home Management (perform basic housework or laundry): Independent Manage your own finances?: yes Primary transportation is: driving Concerns about vision?: no *vision screening is required for WTM* Concerns about hearing?: no  Fall Screening Falls in the past year?: 1 Number of falls in past year:  0 Was there an injury with Fall?: 0 Fall Risk Category Calculator: 1 Patient Fall Risk Level: Low Fall Risk  Fall Risk Patient at Risk for Falls Due to: Impaired balance/gait Fall risk Follow up: Falls evaluation completed  Home and Transportation Safety: All rugs have non-skid backing?: yes All stairs or steps have railings?: yes Grab bars in the bathtub or shower?: yes Have non-skid surface in bathtub or shower?: yes Good home lighting?: yes Regular seat belt use?: yes Hospital stays in the last year:: no  Cognitive Assessment Difficulty concentrating, remembering, or making decisions? : no Will 6CIT or Mini Cog be Completed: yes What year is it?: 0 points What month is it?: 0 points Give patient an address phrase to remember (5 components): [address] [place]  About what time is it?: 0 points Count backwards from 20 to 1: 0 points Say the months of the year in reverse: 0 points Repeat the address phrase from earlier: 0 points 6 CIT Score: 0 points  Advance Directives (For Healthcare) Does Patient Have a Medical Advance Directive?: Yes Does patient want to make changes to medical advance directive?: No - Patient declined Type of Advance Directive: Healthcare Power of [place]; Living will Copy of Healthcare Power of Attorney in Chart?: No - copy requested Copy of Living Will in Chart?: No - copy requested  Reviewed/Updated  Reviewed/Updated: Reviewed All (Medical, Surgical, Family, Medications, Allergies, Care Teams, Patient Goals)    Allergies (verified) Lipitor [atorvastatin ], Boniva [ibandronate], Hydrocodone -acetaminophen , Oxycodone , and Tramadol    Current Medications (verified) Outpatient Encounter Medications as of 03/22/2024  Medication Sig   acetaminophen  (TYLENOL ) 500 MG tablet Take 500 mg by mouth every 6 (six) hours as needed.   benzonatate  (TESSALON ) 100 MG capsule Take 1 capsule (100 mg total) by mouth 2 (two) times daily as needed for cough.  (Patient not taking: Reported on 02/10/2024)  Biotin  5000 MCG TABS Take 5,000 mcg by mouth daily.   Calcium -Magnesium -Zinc (CAL-MAG-ZINC PO) Take 1 tablet by mouth daily. 600 mg   Cholecalciferol  (VITAMIN D ) 50 MCG (2000 UT) tablet Take 2,000 Units by mouth daily.   citalopram  (CELEXA ) 40 MG tablet Take 1 tablet (40 mg total) by mouth daily.   Cyanocobalamin  (VITAMIN B-12 PO) Take 2,500 mcg by mouth daily.   denosumab  (PROLIA ) 60 MG/ML SOSY injection Inject 60 mg into the skin every 6 (six) months.   diphenhydrAMINE  (BENADRYL ) 25 MG tablet Take 25 mg by mouth every 6 (six) hours as needed for allergies.   Evolocumab  (REPATHA  SURECLICK) 140 MG/ML SOAJ Inject 140 mg into the skin every 14 (fourteen) days.   famotidine  (PEPCID ) 40 MG tablet TAKE 1 TABLET BY MOUTH EVERY DAY   fluticasone  (FLONASE ) 50 MCG/ACT nasal spray SPRAY 2 SPRAYS INTO EACH NOSTRIL EVERY DAY   ipratropium (ATROVENT ) 0.03 % nasal spray Place 2 sprays into both nostrils 3 (three) times daily as needed for rhinitis.   Magnesium  250 MG CAPS    metoprolol  tartrate (LOPRESSOR ) 25 MG tablet Take 1 tablet (25 mg total) by mouth every 6 (six) hours as needed. For A-Fib   Omega-3 Fatty Acids (FISH OIL) 1200 MG CAPS Take 1,200 mg by mouth daily.   Probiotic Product (PROBIOTIC DAILY PO) Take 1 tablet by mouth daily.   SUMAtriptan  (IMITREX ) 100 MG tablet Take 1 tablet (100 mg total) by mouth once for 1 dose. Can repeat dose in 2 hours if symptoms persist. Max dose 2 tablets in 24 hours. (Patient taking differently: Take 100 mg by mouth as needed. Can repeat dose in 2 hours if symptoms persist. Max dose 2 tablets in 24 hours.)   terbinafine  (LAMISIL ) 250 MG tablet Take one pill a day for 7 days and repeat for 3 7 days treatment   traZODone  (DESYREL ) 50 MG tablet TAKE 0.5-1 TABLETS BY MOUTH AT BEDTIME AS NEEDED FOR SLEEP.   zolpidem  (AMBIEN ) 5 MG tablet Take 0.5-1 tablets (2.5-5 mg total) by mouth at bedtime as needed for sleep. (Patient not  taking: Reported on 02/10/2024)   No facility-administered encounter medications on file as of 03/22/2024.    History: Past Medical History:  Diagnosis Date   A-fib (HCC)    Acid reflux    Acute cystitis with positive culture 03/06/2014   Anemia    Anxiety    Arthritis    Baker cyst, left 04/06/2016   Aspirated 04/06/2016   Breast cancer (HCC) 11/23/2007   right, ER/PR -, HER 2 -   Cognitive deficits 09/25/2014   2016 Very mild, could be related to meds    Degenerative arthritis of left knee 04/06/2016   Injection and aspiration 04/06/2016   Degenerative arthritis of right knee 09/27/2015   Depression    Esophageal stricture    Gastritis 10/2007   History of chemotherapy    neoadjuvant chemo, last dose 03/2008   History of radiation therapy 07/25/08 -09/11/08   right breast   Hx of colonic polyps 08/11/2016   Hyperlipidemia    Migraines    rare   Myalgia 07/18/2013   5/15 multiple site    OSA (obstructive sleep apnea)    severe OSA AHI 34, intolerant to PAP and now s/p hypoglossal nerve stimulator implant   Osteopenia    Rupture of quadriceps tendon 03/23/2017   Past Surgical History:  Procedure Laterality Date   ATRIAL FIBRILLATION ABLATION N/A 10/07/2018   Procedure: ATRIAL FIBRILLATION ABLATION;  Surgeon: Inocencio Soyla Lunger, MD;  Location: [facility] INVASIVE CV LAB;  Service: Cardiovascular;  Laterality: N/A;   BREAST LUMPECTOMY  06/26/2008   right, CA   XRT, chemo   COLONOSCOPY     DRUG INDUCED ENDOSCOPY N/A 02/21/2020   Procedure: DRUG INDUCED ENDOSCOPY;  Surgeon: Carlie Clark, MD;  Location: [facility];  Service: ENT;  Laterality: N/A;   EYE SURGERY Bilateral    growth removed per right thigh     as teen - mole   IMPLANTATION OF HYPOGLOSSAL NERVE STIMULATOR N/A 05/15/2020   Procedure: IMPLANTATION OF HYPOGLOSSAL NERVE STIMULATOR;  Surgeon: Carlie Clark, MD;  Location: [facility] OR;  Service: ENT;  Laterality: N/A;   JOINT REPLACEMENT  12/31/2015   RTK    QUADRICEPS TENDON REPAIR Right 09/14/2016   Procedure: OPEN REPAIR QUADRICEP TENDON;  Surgeon: Ernie Cough, MD;  Location: [facility] ORS;  Service: Orthopedics;  Laterality: Right;   TOTAL KNEE ARTHROPLASTY Right 12/31/2015   Procedure: RIGHT TOTAL KNEE ARTHROPLASTY;  Surgeon: Cough Ernie, MD;  Location: [facility] OR[facility];  Service: Orthopedics;  Laterality: Right;   TOTAL KNEE ARTHROPLASTY Left 10/11/2020   Procedure: TOTAL KNEE ARTHROPLASTY;  Surgeon: Jerri Kay HERO, MD;  Location: [facility] OR;  Service: Orthopedics;  Laterality: Left;   UPPER GASTROINTESTINAL ENDOSCOPY     Family History  Problem Relation Age of Onset   Epilepsy Mother        at older age   Dementia Mother        ?uncertain type   Hip fracture Mother        x2   Heart disease Father    Cancer Father        lung-nonsmoker   Cancer - Lung Father    Breast cancer Sister 85   Colon cancer Sister 52   Other Brother        suicide   Other Maternal Grandfather        didn't go to doctor   Esophageal cancer Neg Hx    Stomach cancer Neg Hx    Social History   Occupational History    Employer: RETIRED  Tobacco Use   Smoking status: Former    Current packs/day: 0.00    Average packs/day: 0.3 packs/day for 5.0 years (1.3 ttl pk-yrs)    Types: Cigarettes    Start date: 03/02/1962    Quit date: 03/03/1967    Years since quitting: 57.0    Passive exposure: Past   Smokeless tobacco: Never   Tobacco comments:    6 cig per day when she was smoking/quit years ago  Vaping Use   Vaping status: Never Used  Substance and Sexual Activity   Alcohol use: Yes    Alcohol/week: 7.0 standard drinks of alcohol    Types: 7 Cans of beer per week    Comment: 1 beer per day - 2.4 % alcholol - [employer] 55   Drug use: No   Sexual activity: Yes    Partners: Male    Birth control/protection: Post-menopausal   Tobacco Counseling Counseling given: No Tobacco comments: 6 cig per day when she was smoking/quit years ago  SDOH Screenings   Food  Insecurity: No Food Insecurity (03/22/2024)  Housing: Unknown (03/22/2024)  Transportation Needs: No Transportation Needs (03/22/2024)  Utilities: Not At Risk (03/22/2024)  Alcohol Screen: Low Risk (12/13/2023)  Depression (PHQ2-9): Low Risk (03/22/2024)  Financial Resource Strain: Low Risk (12/13/2023)  Physical Activity: Insufficiently Active (03/22/2024)  Social Connections: Socially Integrated (03/22/2024)  Stress:  Stress Concern Present (03/22/2024)  Tobacco Use: Medium Risk (03/22/2024)  Health Literacy: Adequate Health Literacy (03/22/2024)   See flowsheets for full screening details  Depression Screen PHQ 2 & 9 Depression Scale- Over the past 2 weeks, how often have you been bothered by any of the following problems? Little interest or pleasure in doing things: 0 Feeling down, depressed, or hopeless (PHQ Adolescent also includes...irritable): 0 PHQ-2 Total Score: 0 Trouble falling or staying asleep, or sleeping too much: 0 Feeling tired or having little energy: 0 Poor appetite or overeating (PHQ Adolescent also includes...weight loss): 0 Feeling bad about yourself - or that you are a failure or have let yourself or your family down: 0 Trouble concentrating on things, such as reading the newspaper or watching television (PHQ Adolescent also includes...like school work): 0 Moving or speaking so slowly that other people could have noticed. Or the opposite - being so fidgety or restless that you have been moving around a lot more than usual: 0 Thoughts that you would be better off dead, or of hurting yourself in some way: 0 PHQ-9 Total Score: 0     Goals Addressed               This Visit's Progress     Increase physical activity (pt-stated)        Remain active!             Objective:    Today's Vitals   03/22/24 0818  Weight: 130 lb (59 kg)  Height: 5' 7 (1.702 m)   Body mass index is 20.36 kg/m.  Hearing/Vision screen Hearing Screening - Comments:: Denies  hearing difficulties   Vision Screening - Comments:: Wears rx glasses - up to date with routine eye exams with  Dr Octavia Immunizations and Health Maintenance Health Maintenance  Topic Date Due   COVID-19 Vaccine (7 - 2025-26 season) 11/01/2023   DTaP/Tdap/Td (3 - Td or Tdap) 03/03/2025   Mammogram  03/17/2025   Medicare Annual Wellness (AWV)  03/22/2025   Pneumococcal Vaccine: 50+ Years  Completed   Influenza Vaccine  Completed   Bone Density Scan  Completed   Hepatitis C Screening  Completed   Zoster Vaccines- Shingrix  Completed   Meningococcal B Vaccine  Aged Out   Colonoscopy  Discontinued        Assessment/Plan:  This is a routine wellness examination for [place].  Patient Care Team: Ozell Heron HERO, MD as PCP - General (Family Medicine) Jeffrie Oneil BROCKS, MD as PCP - Cardiology (Cardiology) Inocencio Soyla Lunger, MD as PCP - Electrophysiology (Cardiology) Shlomo Wilbert SAUNDERS, MD as PCP - Sleep Medicine (Cardiology) Shlomo Wilbert SAUNDERS, MD as Consulting Physician (Sleep Medicine) Liane Sharyne MATSU, [facility] (Inactive) as Pharmacist (Pharmacist) Jerri Kay HERO, MD as Attending Physician (Orthopedic Surgery)  I have personally reviewed and noted the following in the patients chart:   Medical and social history Use of alcohol, tobacco or illicit drugs  Current medications and supplements including opioid prescriptions. Functional ability and status Nutritional status Physical activity Advanced directives List of other physicians Hospitalizations, surgeries, and ER visits in previous 12 months Vitals Screenings to include cognitive, depression, and falls Referrals and appointments  No orders of the defined types were placed in this encounter.  In addition, I have reviewed and discussed with patient certain preventive protocols, quality metrics, and best practice recommendations. A written personalized care plan for preventive services as well as general preventive health  recommendations were provided to patient.  Rojelio LELON Blush, LPN   [id number]/7973   Return in 53 weeks (on 03/28/2025).  After Visit Summary: (MyChart) Due to this being a telephonic visit, the after visit summary with patients personalized plan was offered to patient via MyChart   Nurse Notes: No voiced or noted concerns at this time "

## 2024-04-07 ENCOUNTER — Ambulatory Visit (INDEPENDENT_AMBULATORY_CARE_PROVIDER_SITE_OTHER)

## 2024-04-07 DIAGNOSIS — B351 Tinea unguium: Secondary | ICD-10-CM

## 2024-04-07 NOTE — Progress Notes (Signed)
 Patient presents today for the 1st laser treatment. Diagnosed with mycotic nail infection by Dr. Magdalen.   Toenail most affected right 1st. Right hallux nail has a yellowish discoloration on the medial border. Patient trimmed nail before appointment.  All other systems are negative.  Nails were filed thin. Laser therapy was administered to right 1st toenail and patient tolerated the treatment well. All safety precautions were in place.   Post treatment instructions reviewed and provided to patient. Patient had no questions regarding plan of care.   Follow up in 4 weeks for laser # 2.

## 2024-04-07 NOTE — Patient Instructions (Signed)
 Follow-up care is important to the success of your foot laser treatment. Please keep these instructions for future reference.    Normal activity can resume immediately.   2. IF your doctor prescribed an antifungal cream apply medication to the skin on the bottom of your feet, sides of your feet, between your toes, and around your nails. (This cream is not intended for use on your nails) Apply cream as directed  3. IF a topical for your nails was prescribed by your doctor, apply to nail/nails once daily until nail is free from infection unless otherwise directed by your doctor. Maximum use for nail topical is 12 months.   4. Spray the insides of your shoes with an over the counter antifungal spray or Lysol disinfectant (aerosol can) at the end of each day or use an ultra violet shoe sterilizer as directed. Try not to wear the same shoes everyday.   5. Buy new nail clippers and files since your current pair may be infected. Metal nail care instruments may also be cleaned with diluted bleach or boiling water. DO NOT share nail clippers or nail files.  6. Keep your toenails trimmed and clean.   7. Wear flip-flops in public places especially hotel rooms and showers, athletic club locker rooms and showers and indoor swimming pools.   8. Avoid nail salons that do not clean their instruments properly or use a whirlpool system.   9. Sun Exposure after lasers: Strict Sun Avoidance (Initially): Avoid direct sun exposure to your feet for at least one to two weeks following the treatment. Use Sunscreen: When outdoors, apply a broad-spectrum sunscreen with an SPF of 30 or higher to your feet, even on cloudy days. Wear Protective Clothing: Cover your feet with socks or shoes whenever possible, especially during peak sun hours.

## 2024-04-20 ENCOUNTER — Ambulatory Visit: Admitting: Cardiology

## 2024-05-05 ENCOUNTER — Ambulatory Visit

## 2024-12-07 ENCOUNTER — Inpatient Hospital Stay: Attending: Hematology and Oncology | Admitting: Hematology and Oncology

## 2025-03-28 ENCOUNTER — Ambulatory Visit
